# Patient Record
Sex: Male | Born: 1935 | Race: White | Hispanic: No | Marital: Married | Smoking: Former smoker
Health system: Southern US, Community
[De-identification: ages and names within clinical notes are randomized; demographics above are authoritative.]

## PROBLEM LIST (undated history)

## (undated) DIAGNOSIS — C4491 Basal cell carcinoma of skin, unspecified: Secondary | ICD-10-CM

## (undated) DIAGNOSIS — K4090 Unilateral inguinal hernia, without obstruction or gangrene, not specified as recurrent: Secondary | ICD-10-CM

## (undated) DIAGNOSIS — I471 Supraventricular tachycardia, unspecified: Secondary | ICD-10-CM

## (undated) DIAGNOSIS — H52209 Unspecified astigmatism, unspecified eye: Secondary | ICD-10-CM

## (undated) DIAGNOSIS — H353190 Nonexudative age-related macular degeneration, unspecified eye, stage unspecified: Secondary | ICD-10-CM

## (undated) HISTORY — PX: TONSILLECTOMY: SUR1361

## (undated) HISTORY — PX: HERNIA REPAIR: SHX51

## (undated) HISTORY — PX: CATARACT EXTRACTION, BILATERAL: SHX1313

## (undated) HISTORY — PX: APPENDECTOMY: SHX54

## (undated) HISTORY — PX: JOINT REPLACEMENT: SHX530

---

## 2000-08-10 ENCOUNTER — Encounter: Admission: RE | Admit: 2000-08-10 | Discharge: 2000-08-10 | Payer: Self-pay | Admitting: *Deleted

## 2000-08-10 ENCOUNTER — Encounter: Payer: Self-pay | Admitting: *Deleted

## 2007-08-11 ENCOUNTER — Inpatient Hospital Stay (HOSPITAL_COMMUNITY): Admission: RE | Admit: 2007-08-11 | Discharge: 2007-08-15 | Payer: Self-pay | Admitting: Orthopaedic Surgery

## 2011-02-10 NOTE — Discharge Summary (Signed)
NAMERIOT, WATERWORTH NO.:  [id number]   MEDICAL RECORD NO.:  [id number]          PATIENT TYPE:  INP   LOCATION:  5036                         FACILITY:  [facility]   PHYSICIAN:  Lubertha Basque. Dalldorf, M.D.DATE OF BIRTH:  1930/12/18   DATE OF ADMISSION:  08/11/2007  DATE OF DISCHARGE:  08/15/2007                               DISCHARGE SUMMARY   ADMITTING DIAGNOSES:  1. Right hip end-stage degenerative joint disease.  2. Hyperlipidemia.   DISCHARGE DIAGNOSES:  1. Right hip end-stage degenerative joint disease.  2. Hyperlipidemia.   OPERATION:  Right hip replacement.   BRIEF HISTORY:  Mr. Isola is a 77 year old white male patient well-known  to our practice, who had been seen about a year ago, but he has had  increasing right hip pain.  X-rays reveal end-stage DJD, bone on bone.  He is having pain with every step walking and also night.  We have  discussed treatment options with him, that being total hip replacement.   PERTINENT LABORATORY AND X-RAY FINDINGS:  Sodium 135, potassium 4.4,  glucose 89, BUN 18, creatinine 0.88.  Hemoglobin 15.6 with a slight drop  postop, WBC 9.5.   Chest x-ray:  No active disease.   COURSE IN THE HOSPITAL:  He was admitted postoperatively, IV Ancef 1 g  q.8 h. for 3 doses, PCA morphine pump and then the normal postoperative  protocol which includes DVT prophylaxis with Lovenox and Coumadin  regulated by Pharmacy, oral pain medications, incentive spirometry, knee-  high TEDs, physical therapy, to be out of bed touchdown weightbearing  only, on muscle relaxers and antiemetics p.r.n. and blood studies, CBC,  BMET and pro times, as stated above.  The first day postop, he was doing  very well and was little pain.  He was anticipating starting his  physical therapy.  Blood pressure was 94/58, temperature 97, hemoglobin  12.  He did have some urinary retention on an ultrasound and was noted  to have significant urinary contents in his  bladder; an I&O  catheterization was performed and then he later started to void better  and completely, breath sounds in all fields, abdomen soft and pain-free.  He was eating and voiding on his own, no sign of infection.  Dressing  was changed the second day postop; the wound was noted to be benign.  He  continued to progress in the hospital.  Home arrangements for home  therapy and INRs were taken care of through [facility] and he was  discharged home.   CONDITION ON DISCHARGE:  Improved.   DISCHARGE MEDICATIONS:  He will remain on:  1. Lipitor 10 mg one a day.  2. Silver Centrum.  3. Vitamin E.  4. Coumadin, dose regulated by Pharmacy.  5. Prescription for Percocet 5/325 mg one or two q.4-6 h. p.r.n. pain.   HOME CARE:  [facility] will provide his home care, as mentioned.   DIET:  He will be on a low-sodium heart-healthy diet.   WOUND CARE:  He may change his dressing daily.   ACTIVITY:  Touchdown weightbearing with  crutches or walker.   FOLLOWUP:  If any sign of infection, to call our office at [phone],  also return in 10 days to our office and will follow.      Lindwood Qua, P.A.      Lubertha Basque Jerl Santos, M.D.  Electronically Signed    MC/MEDQ  D:  08/14/2007  T:  08/15/2007  Job:  [id number]

## 2011-02-10 NOTE — Op Note (Signed)
NAMEJAX, Gary Rice NO.:  [id number]   MEDICAL RECORD NO.:  [id number]          PATIENT TYPE:  INP   LOCATION:  2899                         FACILITY:  [facility]   PHYSICIAN:  Lubertha Basque. Dalldorf, M.D.DATE OF BIRTH:  November 25, 1930   DATE OF PROCEDURE:  08/11/2007  DATE OF DISCHARGE:                               OPERATIVE REPORT   PREOPERATIVE DIAGNOSIS:  Right hip degenerative arthritis.   POSTOPERATIVE DIAGNOSIS:  Right hip degenerative arthritis.   PROCEDURE:  Right total hip replacement.   ANESTHESIA:  General.   ATTENDING SURGEON:  Lubertha Basque. Jerl Santos, M.D.   ASSISTANT:  Lindwood Qua, P.A.   INDICATIONS FOR PROCEDURE:  The patient is a 77 year old male with a  long history of right hip pain.  This has persisted despite various oral  anti-inflammatories and walking devices.  He has advanced degenerative  change on x-ray and pain which limits his ability to rest and walk.  He  is offered a hip replacement.  Informed operative consent was obtained  after discussion possible complications of reaction to anesthesia,  infection, DVT, PE, dislocation, and death.   SUMMARY OF FINDINGS AND PROCEDURE:  Under general anesthesia through a  posterior approach, a right total hip replacement was performed.  He had  advanced degenerative change and excellent bone quality.  Addressed this  problem with a uncemented DePuy ASR system using a size 8 high offset  [place] femoral stem with the 56 ASR cup.  The stem was capped with a 49  +5 ASR head.  Bryna Colander assisted throughout and was invaluable to  the completion of the case in that he helped position and retract while  I performed the procedure.  He also closed simultaneously to help  minimize OR time.   DESCRIPTION OF PROCEDURE:  The patient was taken to the operating suite  where general anesthetic was applied difficulty.  He was positioned in  the lateral decubitus position with the right hip up.  Hip positioners  were utilized, bony prominences were padded, and an axillary roll was  placed.  He was prepped and draped in normal sterile fashion.  After the  administration of preoperative IV Kefzol, a posterior approach was taken  of the right hip.  All appropriate anti-infective measures were used  including closed hooded exhaust systems for each member of the surgical  team, Betadine impregnated drape, and the preoperative IV antibiotic.  Dissection was carried down through a paucity of adipose tissue to  expose the IT band and gluteus maximus fascia which were incised  longitudinally to expose the short external rotators of the hip.  These  structures were tagged and reflected and a posterior capsulectomy was  performed.  The hip was dislocated.  A femoral neck cut was made just  above the lesser trochanter.  He had advanced degenerative change with  collapse of his femoral head.  He had excellent bone quality.  The  acetabulum was fully exposed and some labral tissues were excised.  We  took a small reamer to the inside wall of the pelvis and then  sequentially came up to a 55 followed by placement of size 56 ASR shell  and appropriate anteversion and tilt.  The femur was then exposed.  We  reamed laterally and then sequentially broached up to a size 8 which  seemed to fit best.  We did a trial reduction with a high offset  component and the +2 and 5 assemblies with a +5 giving him the best leg  length and equality.  He was stable in extension with external rotation  and flexion with internal rotation.  Trial components were removed  followed by placement of the size 8 high offset femoral stem with the +5  49 ASR head and neck assemblies.  The stem was placed in appropriate  anteversion.  The hip was again reduced and was stable.  The wound was  irrigated followed by reapproximation of the short external rotators to  the greater trochanteric region with nonabsorbable suture.  IT band and  gluteus  maximus fascia reapproximated with #1 Vicryl in interrupted  fashion.  Subcutaneous tissues reapproximated with 0 and 2-0 undyed  Vicryl, followed by skin closure with staples.  A Mepilex silicone  dressing was applied.  Estimated blood loss was 100 mL and  intraoperative fluids can be obtained from anesthesia records.   DISPOSITION:  The patient was extubated in the operating room and taken  to the recovery room in stable addition.  He was to be admitted to the  orthopedic surgery service for appropriate postop care to include  perioperative antibiotics and Coumadin plus Lovenox for DVT prophylaxis.      Lubertha Basque Jerl Santos, M.D.  Electronically Signed     PGD/MEDQ  D:  08/11/2007  T:  08/12/2007  Job:  [id number]

## 2011-07-07 LAB — BASIC METABOLIC PANEL
BUN: 12
BUN: 14
CO2: 29
CO2: 30
Calcium: 8.8
Calcium: 8.8
Chloride: 101
Chloride: 103
Creatinine, Ser: 0.77
Creatinine, Ser: 0.87
GFR calc Af Amer: >60
GFR calc Af Amer: >60
GFR calc non Af Amer: >60
GFR calc non Af Amer: >60
Glucose, Bld: 111 — ABNORMAL HIGH
Glucose, Bld: 119 — ABNORMAL HIGH
Potassium: 3.5
Potassium: 4.2
Sodium: 136
Sodium: 139

## 2011-07-07 LAB — PROTIME-INR
INR: 1.3
INR: 1.7 — ABNORMAL HIGH
INR: 2.6 — ABNORMAL HIGH
Prothrombin Time: 16.7 — ABNORMAL HIGH
Prothrombin Time: 20.7 — ABNORMAL HIGH
Prothrombin Time: 28.7 — ABNORMAL HIGH
Prothrombin Time: 13.5

## 2011-07-07 LAB — BASIC METABOLIC PANEL WITH GFR
BUN: 16
BUN: 18
CO2: 26
CO2: 27
Calcium: 8.8
Calcium: 9.8
Chloride: 100
Chloride: 103
Creatinine, Ser: 0.87
Creatinine, Ser: 0.88
GFR calc non Af Amer: >60
GFR calc non Af Amer: >60
Glucose, Bld: 163 — ABNORMAL HIGH
Glucose, Bld: 89
Potassium: 4.3
Potassium: 4.4
Sodium: 135
Sodium: 136

## 2011-07-07 LAB — CBC
HCT: 34.6 — ABNORMAL LOW
HCT: 34.8 — ABNORMAL LOW
HCT: 45.7
Hemoglobin: 12 — ABNORMAL LOW
MCHC: 34.6
MCV: 92.5
Platelets: 193
Platelets: 199
Platelets: 229
Platelets: 281
RBC: 3.76 — ABNORMAL LOW
RBC: 4.01 — ABNORMAL LOW
RDW: 12.8
RDW: 12.8
RDW: 13
WBC: 12.1 — ABNORMAL HIGH
WBC: 12.6 — ABNORMAL HIGH
WBC: 11 — ABNORMAL HIGH

## 2011-10-28 DIAGNOSIS — M5137 Other intervertebral disc degeneration, lumbosacral region: Secondary | ICD-10-CM | POA: Diagnosis not present

## 2011-11-05 DIAGNOSIS — H5210 Myopia, unspecified eye: Secondary | ICD-10-CM | POA: Diagnosis not present

## 2011-11-05 DIAGNOSIS — H35379 Puckering of macula, unspecified eye: Secondary | ICD-10-CM | POA: Diagnosis not present

## 2011-11-05 DIAGNOSIS — H2510 Age-related nuclear cataract, unspecified eye: Secondary | ICD-10-CM | POA: Diagnosis not present

## 2011-11-05 DIAGNOSIS — H353190 Nonexudative age-related macular degeneration, unspecified eye, stage unspecified: Secondary | ICD-10-CM | POA: Diagnosis not present

## 2011-11-18 DIAGNOSIS — M25519 Pain in unspecified shoulder: Secondary | ICD-10-CM | POA: Diagnosis not present

## 2011-11-18 DIAGNOSIS — M545 Low back pain, unspecified: Secondary | ICD-10-CM | POA: Diagnosis not present

## 2012-05-05 DIAGNOSIS — H35379 Puckering of macula, unspecified eye: Secondary | ICD-10-CM | POA: Diagnosis not present

## 2012-05-05 DIAGNOSIS — H353190 Nonexudative age-related macular degeneration, unspecified eye, stage unspecified: Secondary | ICD-10-CM | POA: Diagnosis not present

## 2012-05-05 DIAGNOSIS — H2510 Age-related nuclear cataract, unspecified eye: Secondary | ICD-10-CM | POA: Diagnosis not present

## 2012-06-21 DIAGNOSIS — H2510 Age-related nuclear cataract, unspecified eye: Secondary | ICD-10-CM | POA: Diagnosis not present

## 2012-07-29 DIAGNOSIS — Z23 Encounter for immunization: Secondary | ICD-10-CM | POA: Diagnosis not present

## 2012-08-18 DIAGNOSIS — Z79899 Other long term (current) drug therapy: Secondary | ICD-10-CM | POA: Diagnosis not present

## 2012-08-18 DIAGNOSIS — E7800 Pure hypercholesterolemia, unspecified: Secondary | ICD-10-CM | POA: Diagnosis not present

## 2012-08-18 DIAGNOSIS — Z1331 Encounter for screening for depression: Secondary | ICD-10-CM | POA: Diagnosis not present

## 2012-08-18 DIAGNOSIS — Z0000 Encounter for general adult medical examination without abnormal findings: Secondary | ICD-10-CM | POA: Diagnosis not present

## 2012-08-22 DIAGNOSIS — H269 Unspecified cataract: Secondary | ICD-10-CM | POA: Diagnosis not present

## 2012-08-22 DIAGNOSIS — H2510 Age-related nuclear cataract, unspecified eye: Secondary | ICD-10-CM | POA: Diagnosis not present

## 2012-08-23 DIAGNOSIS — H2510 Age-related nuclear cataract, unspecified eye: Secondary | ICD-10-CM | POA: Diagnosis not present

## 2012-09-05 DIAGNOSIS — H2510 Age-related nuclear cataract, unspecified eye: Secondary | ICD-10-CM | POA: Diagnosis not present

## 2012-09-05 DIAGNOSIS — H269 Unspecified cataract: Secondary | ICD-10-CM | POA: Diagnosis not present

## 2013-08-21 DIAGNOSIS — E7800 Pure hypercholesterolemia, unspecified: Secondary | ICD-10-CM | POA: Diagnosis not present

## 2013-08-21 DIAGNOSIS — Z1331 Encounter for screening for depression: Secondary | ICD-10-CM | POA: Diagnosis not present

## 2013-08-21 DIAGNOSIS — Z0000 Encounter for general adult medical examination without abnormal findings: Secondary | ICD-10-CM | POA: Diagnosis not present

## 2013-08-21 DIAGNOSIS — Z23 Encounter for immunization: Secondary | ICD-10-CM | POA: Diagnosis not present

## 2013-08-21 DIAGNOSIS — Z79899 Other long term (current) drug therapy: Secondary | ICD-10-CM | POA: Diagnosis not present

## 2013-10-03 DIAGNOSIS — H353190 Nonexudative age-related macular degeneration, unspecified eye, stage unspecified: Secondary | ICD-10-CM | POA: Diagnosis not present

## 2014-05-24 DIAGNOSIS — R94112 Abnormal visually evoked potential [VEP]: Secondary | ICD-10-CM | POA: Diagnosis not present

## 2014-05-24 DIAGNOSIS — H353190 Nonexudative age-related macular degeneration, unspecified eye, stage unspecified: Secondary | ICD-10-CM | POA: Diagnosis not present

## 2014-07-31 DIAGNOSIS — Z23 Encounter for immunization: Secondary | ICD-10-CM | POA: Diagnosis not present

## 2014-08-22 DIAGNOSIS — L989 Disorder of the skin and subcutaneous tissue, unspecified: Secondary | ICD-10-CM | POA: Diagnosis not present

## 2014-08-22 DIAGNOSIS — E559 Vitamin D deficiency, unspecified: Secondary | ICD-10-CM | POA: Diagnosis not present

## 2014-08-22 DIAGNOSIS — Z125 Encounter for screening for malignant neoplasm of prostate: Secondary | ICD-10-CM | POA: Diagnosis not present

## 2014-08-22 DIAGNOSIS — Z1389 Encounter for screening for other disorder: Secondary | ICD-10-CM | POA: Diagnosis not present

## 2014-08-22 DIAGNOSIS — Z86010 Personal history of colonic polyps: Secondary | ICD-10-CM | POA: Diagnosis not present

## 2014-08-22 DIAGNOSIS — Z23 Encounter for immunization: Secondary | ICD-10-CM | POA: Diagnosis not present

## 2014-08-22 DIAGNOSIS — J310 Chronic rhinitis: Secondary | ICD-10-CM | POA: Diagnosis not present

## 2014-08-22 DIAGNOSIS — E780 Pure hypercholesterolemia: Secondary | ICD-10-CM | POA: Diagnosis not present

## 2014-08-22 DIAGNOSIS — R1030 Lower abdominal pain, unspecified: Secondary | ICD-10-CM | POA: Diagnosis not present

## 2014-08-22 DIAGNOSIS — Z0001 Encounter for general adult medical examination with abnormal findings: Secondary | ICD-10-CM | POA: Diagnosis not present

## 2014-08-22 DIAGNOSIS — Z79899 Other long term (current) drug therapy: Secondary | ICD-10-CM | POA: Diagnosis not present

## 2014-09-05 DIAGNOSIS — C44612 Basal cell carcinoma of skin of right upper limb, including shoulder: Secondary | ICD-10-CM | POA: Diagnosis not present

## 2014-09-05 DIAGNOSIS — C44519 Basal cell carcinoma of skin of other part of trunk: Secondary | ICD-10-CM | POA: Diagnosis not present

## 2014-09-12 DIAGNOSIS — Z4802 Encounter for removal of sutures: Secondary | ICD-10-CM | POA: Diagnosis not present

## 2014-09-12 DIAGNOSIS — L989 Disorder of the skin and subcutaneous tissue, unspecified: Secondary | ICD-10-CM | POA: Diagnosis not present

## 2014-09-17 DIAGNOSIS — M25559 Pain in unspecified hip: Secondary | ICD-10-CM | POA: Diagnosis not present

## 2014-12-04 DIAGNOSIS — H524 Presbyopia: Secondary | ICD-10-CM | POA: Diagnosis not present

## 2014-12-04 DIAGNOSIS — Z961 Presence of intraocular lens: Secondary | ICD-10-CM | POA: Diagnosis not present

## 2014-12-04 DIAGNOSIS — H52222 Regular astigmatism, left eye: Secondary | ICD-10-CM | POA: Diagnosis not present

## 2014-12-04 DIAGNOSIS — H3531 Nonexudative age-related macular degeneration: Secondary | ICD-10-CM | POA: Diagnosis not present

## 2015-06-20 DIAGNOSIS — Z961 Presence of intraocular lens: Secondary | ICD-10-CM | POA: Diagnosis not present

## 2015-06-20 DIAGNOSIS — H35371 Puckering of macula, right eye: Secondary | ICD-10-CM | POA: Diagnosis not present

## 2015-06-20 DIAGNOSIS — H3531 Nonexudative age-related macular degeneration: Secondary | ICD-10-CM | POA: Diagnosis not present

## 2015-08-01 DIAGNOSIS — E559 Vitamin D deficiency, unspecified: Secondary | ICD-10-CM | POA: Diagnosis not present

## 2015-08-01 DIAGNOSIS — Z86010 Personal history of colonic polyps: Secondary | ICD-10-CM | POA: Diagnosis not present

## 2015-08-01 DIAGNOSIS — E7800 Pure hypercholesterolemia, unspecified: Secondary | ICD-10-CM | POA: Diagnosis not present

## 2015-08-01 DIAGNOSIS — Z0001 Encounter for general adult medical examination with abnormal findings: Secondary | ICD-10-CM | POA: Diagnosis not present

## 2015-08-01 DIAGNOSIS — L259 Unspecified contact dermatitis, unspecified cause: Secondary | ICD-10-CM | POA: Diagnosis not present

## 2015-08-01 DIAGNOSIS — J310 Chronic rhinitis: Secondary | ICD-10-CM | POA: Diagnosis not present

## 2015-08-01 DIAGNOSIS — Z23 Encounter for immunization: Secondary | ICD-10-CM | POA: Diagnosis not present

## 2015-08-01 DIAGNOSIS — Z1389 Encounter for screening for other disorder: Secondary | ICD-10-CM | POA: Diagnosis not present

## 2015-08-01 DIAGNOSIS — Z79899 Other long term (current) drug therapy: Secondary | ICD-10-CM | POA: Diagnosis not present

## 2015-08-28 DIAGNOSIS — Z1211 Encounter for screening for malignant neoplasm of colon: Secondary | ICD-10-CM | POA: Diagnosis not present

## 2015-08-28 DIAGNOSIS — Z1212 Encounter for screening for malignant neoplasm of rectum: Secondary | ICD-10-CM | POA: Diagnosis not present

## 2015-09-18 ENCOUNTER — Other Ambulatory Visit: Payer: Self-pay | Admitting: Gastroenterology

## 2015-09-18 DIAGNOSIS — M25559 Pain in unspecified hip: Secondary | ICD-10-CM | POA: Diagnosis not present

## 2015-10-25 ENCOUNTER — Encounter (HOSPITAL_COMMUNITY): Payer: Self-pay | Admitting: *Deleted

## 2015-12-10 DIAGNOSIS — H52222 Regular astigmatism, left eye: Secondary | ICD-10-CM | POA: Diagnosis not present

## 2015-12-10 DIAGNOSIS — H353132 Nonexudative age-related macular degeneration, bilateral, intermediate dry stage: Secondary | ICD-10-CM | POA: Diagnosis not present

## 2015-12-10 DIAGNOSIS — H35371 Puckering of macula, right eye: Secondary | ICD-10-CM | POA: Diagnosis not present

## 2015-12-10 DIAGNOSIS — H524 Presbyopia: Secondary | ICD-10-CM | POA: Diagnosis not present

## 2015-12-17 ENCOUNTER — Ambulatory Visit (HOSPITAL_COMMUNITY): Admission: RE | Admit: 2015-12-17 | Payer: Medicare Other | Source: Ambulatory Visit | Admitting: Gastroenterology

## 2015-12-17 SURGERY — COLONOSCOPY WITH PROPOFOL
Anesthesia: Monitor Anesthesia Care

## 2016-04-02 DIAGNOSIS — L089 Local infection of the skin and subcutaneous tissue, unspecified: Secondary | ICD-10-CM | POA: Diagnosis not present

## 2016-04-02 DIAGNOSIS — L0390 Cellulitis, unspecified: Secondary | ICD-10-CM | POA: Diagnosis not present

## 2016-04-02 DIAGNOSIS — A499 Bacterial infection, unspecified: Secondary | ICD-10-CM | POA: Diagnosis not present

## 2016-04-12 DIAGNOSIS — A499 Bacterial infection, unspecified: Secondary | ICD-10-CM | POA: Diagnosis not present

## 2016-04-12 DIAGNOSIS — L089 Local infection of the skin and subcutaneous tissue, unspecified: Secondary | ICD-10-CM | POA: Diagnosis not present

## 2016-04-12 DIAGNOSIS — L0390 Cellulitis, unspecified: Secondary | ICD-10-CM | POA: Diagnosis not present

## 2016-04-18 DIAGNOSIS — K5641 Fecal impaction: Secondary | ICD-10-CM | POA: Diagnosis not present

## 2016-04-18 DIAGNOSIS — K649 Unspecified hemorrhoids: Secondary | ICD-10-CM | POA: Diagnosis not present

## 2016-04-18 DIAGNOSIS — K5900 Constipation, unspecified: Secondary | ICD-10-CM | POA: Diagnosis not present

## 2016-06-09 DIAGNOSIS — H35371 Puckering of macula, right eye: Secondary | ICD-10-CM | POA: Diagnosis not present

## 2016-06-09 DIAGNOSIS — Z961 Presence of intraocular lens: Secondary | ICD-10-CM | POA: Diagnosis not present

## 2016-06-09 DIAGNOSIS — H353132 Nonexudative age-related macular degeneration, bilateral, intermediate dry stage: Secondary | ICD-10-CM | POA: Diagnosis not present

## 2016-06-09 DIAGNOSIS — R94112 Abnormal visually evoked potential [VEP]: Secondary | ICD-10-CM | POA: Diagnosis not present

## 2016-06-18 DIAGNOSIS — Z23 Encounter for immunization: Secondary | ICD-10-CM | POA: Diagnosis not present

## 2016-09-04 DIAGNOSIS — E559 Vitamin D deficiency, unspecified: Secondary | ICD-10-CM | POA: Diagnosis not present

## 2016-09-04 DIAGNOSIS — J310 Chronic rhinitis: Secondary | ICD-10-CM | POA: Diagnosis not present

## 2016-09-04 DIAGNOSIS — E7800 Pure hypercholesterolemia, unspecified: Secondary | ICD-10-CM | POA: Diagnosis not present

## 2016-09-04 DIAGNOSIS — Z1389 Encounter for screening for other disorder: Secondary | ICD-10-CM | POA: Diagnosis not present

## 2016-09-04 DIAGNOSIS — Z79899 Other long term (current) drug therapy: Secondary | ICD-10-CM | POA: Diagnosis not present

## 2016-09-04 DIAGNOSIS — Z0000 Encounter for general adult medical examination without abnormal findings: Secondary | ICD-10-CM | POA: Diagnosis not present

## 2016-09-04 DIAGNOSIS — Z86010 Personal history of colonic polyps: Secondary | ICD-10-CM | POA: Diagnosis not present

## 2016-09-17 DIAGNOSIS — M25559 Pain in unspecified hip: Secondary | ICD-10-CM | POA: Diagnosis not present

## 2016-12-14 DIAGNOSIS — H35371 Puckering of macula, right eye: Secondary | ICD-10-CM | POA: Diagnosis not present

## 2016-12-14 DIAGNOSIS — H1851 Endothelial corneal dystrophy: Secondary | ICD-10-CM | POA: Diagnosis not present

## 2016-12-14 DIAGNOSIS — H353132 Nonexudative age-related macular degeneration, bilateral, intermediate dry stage: Secondary | ICD-10-CM | POA: Diagnosis not present

## 2017-03-19 DIAGNOSIS — M25559 Pain in unspecified hip: Secondary | ICD-10-CM | POA: Diagnosis not present

## 2017-06-21 DIAGNOSIS — R94112 Abnormal visually evoked potential [VEP]: Secondary | ICD-10-CM | POA: Diagnosis not present

## 2017-06-21 DIAGNOSIS — H353132 Nonexudative age-related macular degeneration, bilateral, intermediate dry stage: Secondary | ICD-10-CM | POA: Diagnosis not present

## 2017-06-21 DIAGNOSIS — Z961 Presence of intraocular lens: Secondary | ICD-10-CM | POA: Diagnosis not present

## 2017-06-21 DIAGNOSIS — H1851 Endothelial corneal dystrophy: Secondary | ICD-10-CM | POA: Diagnosis not present

## 2017-07-01 DIAGNOSIS — I499 Cardiac arrhythmia, unspecified: Secondary | ICD-10-CM | POA: Diagnosis not present

## 2017-07-01 DIAGNOSIS — I471 Supraventricular tachycardia: Secondary | ICD-10-CM | POA: Diagnosis not present

## 2017-07-01 DIAGNOSIS — R079 Chest pain, unspecified: Secondary | ICD-10-CM | POA: Diagnosis not present

## 2017-07-01 DIAGNOSIS — S51812A Laceration without foreign body of left forearm, initial encounter: Secondary | ICD-10-CM | POA: Diagnosis not present

## 2017-07-01 DIAGNOSIS — Z6824 Body mass index (BMI) 24.0-24.9, adult: Secondary | ICD-10-CM | POA: Diagnosis not present

## 2017-07-01 DIAGNOSIS — R000 Tachycardia, unspecified: Secondary | ICD-10-CM | POA: Diagnosis not present

## 2017-07-01 DIAGNOSIS — W268XXA Contact with other sharp object(s), not elsewhere classified, initial encounter: Secondary | ICD-10-CM | POA: Diagnosis not present

## 2017-07-16 DIAGNOSIS — Z23 Encounter for immunization: Secondary | ICD-10-CM | POA: Diagnosis not present

## 2017-08-09 NOTE — Progress Notes (Signed)
.   Cardiology Office Note NEW PATIENT VISIT  Date:  08/10/2017   ID:  Gary Rice, DOB [birth year], MRN [id number]  PCP:  Gary Huddle, MD  Cardiologist:  NEW  Dr. Marlou Rice  Chief Complaint  Patient presents with  . Palpitations    had tachycardia in [place]      History of Present Illness: Gary Rice is a 83 y.o. male who is being seen today for the evaluation of atrial tach  at the request of the patient.  No ref. provider found.   No flowsheet data found.   The first of Oct pt was in [place] for his [employer] and had skin tear when he went to urgent care his HR was 140 and listed as SVT.  He was then transported by EMS to hospital but converted to SR en route.  EKG was SR rate of 71 with early repol.  Non specific ST and T wave abnormality.   CXR fno focal opacity in lungs, no pl effusion no acute process in chest. Na 137, K+ 4.1, cl. 107, C)2 23, Cr 0.8 mg+ 2.1, Hgb 14.9, hct 43.4.    He may have felt mild fluttering but no chest pain and no SOB.  No dizziness.  He has had not further episodes.  He tells me today he may have had 2 previous episode in the past but no symptoms but fluttering and did not last long.  He has not had any chest pain, he mows grass with electric mower and does his own leaves and has no chest pain or SOB.    History reviewed. No pertinent past medical history.  Denies HTN, chest pain, SOB, DM-2, thyroid disease.   Past Surgical History:  Procedure Laterality Date  . APPENDECTOMY     age 64  . CATARACT EXTRACTION, BILATERAL    . HERNIA REPAIR     RIH  . JOINT REPLACEMENT Right    RTHA  . TONSILLECTOMY       Current Outpatient Medications  Medication Sig Dispense Refill  . aspirin EC 81 MG tablet Take 81 mg daily by mouth.    Marland Kitchen atorvastatin (LIPITOR) 10 MG tablet Take 10 mg by mouth daily.    . fluticasone (FLONASE) 50 MCG/ACT nasal spray Place 1 spray into both nostrils daily as needed for allergies.     .  Multiple Vitamin (MULTIVITAMIN WITH MINERALS) TABS tablet Take 1 tablet by mouth daily.    . Multiple Vitamins-Minerals (PRESERVISION AREDS 2) CAPS Take 1 capsule 2 (two) times daily by mouth.    Marland Kitchen VITAMIN E PO Take 1 capsule by mouth daily.      No current facility-administered medications for this visit.     Allergies:   Patient has no known allergies.    Social History:  The patient  reports that he quit smoking about 66 years ago. His smoking use included cigarettes. he has never used smokeless tobacco. He reports that he does not drink alcohol or use drugs.   Family History:  The patient's family history includes Heart attack in his father and son; Heart disease in his father; Sleep apnea in his son.    ROS:  General:no colds or fevers, no weight changes Skin:no rashes or ulcers HEENT:no blurred vision, no congestion CV:see HPI PUL:see HPI GI:no diarrhea constipation or melena, no indigestion GU:no hematuria, no dysuria MS:no joint pain, no claudication Neuro:no syncope, no lightheadedness Endo:no diabetes, no thyroid disease  Wt Readings from Last  3 Encounters:  08/10/17 179 lb 12.8 oz (81.6 kg)     PHYSICAL EXAM: VS:  BP 108/60   Pulse 78   Resp 17   Ht 5\' 10"  (1.778 m)   Wt 179 lb 12.8 oz (81.6 kg)   SpO2 97%   BMI 25.80 kg/m  , BMI Body mass index is 25.8 kg/m. General:Pleasant affect, NAD Skin:Warm and dry, brisk capillary refill HEENT:normocephalic, sclera clear, mucus membranes moist Neck:supple, no JVD, no bruits  Heart:S1S2 RRR without murmur, gallup, rub or click Lungs:clear without rales, rhonchi, or wheezes WLS:LHTD, non tender, + BS, do not palpate liver spleen or masses Ext:no lower ext edema, 2+ pedal pulses, 2+ radial pulses Neuro:alert and oriented X 3, MAE, follows commands, + facial symmetry    EKG:  EKG is ordered today. The ekg ordered today demonstrates SR with sinus arrthymia, peaked T wave in V2-V5.   Recent Labs: No results found  for requested labs within last 8760 hours.    Lipid Panel No results found for: CHOL, TRIG, HDL, CHOLHDL, VLDL, LDLCALC, LDLDIRECT     Other studies Reviewed: Additional studies/ records that were reviewed today include: notes from hospital in [place].   ASSESSMENT AND PLAN:  1.  SVT 3 episodes in his life.  Discussed with Dr. Marlou Rice and will do Echo to eval LV function and valves with abnormal EKG.  If he has further episodes with give dilt 30 mg to take once prn for rapid HR.  Or he could call office to have urgent ekg.  He has no chest pain so instructed if he develops any we will do stress test- myoview.   He will follow up with Dr. Marlou Rice at next available appt.    Current medicines are reviewed with the patient today.  The patient Has no concerns regarding medicines.  The following changes have been made:  See above Labs/ tests ordered today include:see above  Disposition:   FU:  see above  Signed, Gary Kicks, NP  08/10/2017 9:13 AM    [address], [place], [place], [place] Phone: [phone]; Fax: [phone]

## 2017-08-10 ENCOUNTER — Encounter: Payer: Self-pay | Admitting: Cardiology

## 2017-08-10 ENCOUNTER — Ambulatory Visit (INDEPENDENT_AMBULATORY_CARE_PROVIDER_SITE_OTHER): Payer: Medicare Other | Admitting: Cardiology

## 2017-08-10 ENCOUNTER — Encounter (INDEPENDENT_AMBULATORY_CARE_PROVIDER_SITE_OTHER): Payer: Self-pay

## 2017-08-10 VITALS — BP 108/60 | HR 78 | Resp 17 | Ht 70.0 in | Wt 179.8 lb

## 2017-08-10 DIAGNOSIS — I471 Supraventricular tachycardia, unspecified: Secondary | ICD-10-CM

## 2017-08-10 DIAGNOSIS — R9431 Abnormal electrocardiogram [ECG] [EKG]: Secondary | ICD-10-CM | POA: Diagnosis not present

## 2017-08-10 DIAGNOSIS — R000 Tachycardia, unspecified: Secondary | ICD-10-CM

## 2017-08-10 MED ORDER — DILTIAZEM HCL 30 MG PO TABS: ORAL_TABLET | ORAL | 0 refills | Status: DC

## 2017-08-10 NOTE — Patient Instructions (Signed)
Medication Instructions:  START Cardizem 30 mg take 1 tablet as needed for rapid heart rate  Labwork: None   Testing/Procedures: Your physician has requested that you have an echocardiogram. Echocardiography is a painless test that uses sound waves to create images of your heart. It provides your doctor with information about the size and shape of your heart and how well your heart's chambers and valves are working. This procedure takes approximately one hour. There are no restrictions for this procedure.   Follow-Up: Your physician recommends that you schedule a follow-up appointment in: FIRST AVAILABLE with DR Marlou Porch  Any Other Special Instructions Will Be Listed Below (If Applicable).  If you need a refill on your cardiac medications before your next appointment, please call your pharmacy.

## 2017-08-16 ENCOUNTER — Other Ambulatory Visit: Payer: Self-pay

## 2017-08-16 ENCOUNTER — Ambulatory Visit (HOSPITAL_COMMUNITY): Payer: Medicare Other | Attending: Cardiology

## 2017-08-16 DIAGNOSIS — I080 Rheumatic disorders of both mitral and aortic valves: Secondary | ICD-10-CM | POA: Insufficient documentation

## 2017-08-16 DIAGNOSIS — R9431 Abnormal electrocardiogram [ECG] [EKG]: Secondary | ICD-10-CM | POA: Insufficient documentation

## 2017-08-16 DIAGNOSIS — R000 Tachycardia, unspecified: Secondary | ICD-10-CM | POA: Diagnosis not present

## 2017-08-17 ENCOUNTER — Telehealth: Payer: Self-pay | Admitting: Cardiology

## 2017-08-17 NOTE — Telephone Encounter (Signed)
Returned pts call and he has been made aware of his echo results. See result note. 

## 2017-08-17 NOTE — Telephone Encounter (Signed)
Follow up ° °Patient is returning call for Echo results. °

## 2017-09-17 DIAGNOSIS — Z1389 Encounter for screening for other disorder: Secondary | ICD-10-CM | POA: Diagnosis not present

## 2017-09-17 DIAGNOSIS — Z0000 Encounter for general adult medical examination without abnormal findings: Secondary | ICD-10-CM | POA: Diagnosis not present

## 2017-09-17 DIAGNOSIS — E7800 Pure hypercholesterolemia, unspecified: Secondary | ICD-10-CM | POA: Diagnosis not present

## 2017-09-17 DIAGNOSIS — Z79899 Other long term (current) drug therapy: Secondary | ICD-10-CM | POA: Diagnosis not present

## 2017-09-17 DIAGNOSIS — E559 Vitamin D deficiency, unspecified: Secondary | ICD-10-CM | POA: Diagnosis not present

## 2017-09-17 DIAGNOSIS — R000 Tachycardia, unspecified: Secondary | ICD-10-CM | POA: Diagnosis not present

## 2017-09-17 DIAGNOSIS — J310 Chronic rhinitis: Secondary | ICD-10-CM | POA: Diagnosis not present

## 2017-09-17 DIAGNOSIS — Z86010 Personal history of colonic polyps: Secondary | ICD-10-CM | POA: Diagnosis not present

## 2017-09-17 DIAGNOSIS — M25559 Pain in unspecified hip: Secondary | ICD-10-CM | POA: Diagnosis not present

## 2017-09-17 DIAGNOSIS — M1990 Unspecified osteoarthritis, unspecified site: Secondary | ICD-10-CM | POA: Diagnosis not present

## 2017-10-05 ENCOUNTER — Other Ambulatory Visit: Payer: Self-pay | Admitting: Cardiology

## 2017-10-05 DIAGNOSIS — R000 Tachycardia, unspecified: Secondary | ICD-10-CM

## 2017-10-05 DIAGNOSIS — R9431 Abnormal electrocardiogram [ECG] [EKG]: Secondary | ICD-10-CM

## 2017-10-08 ENCOUNTER — Ambulatory Visit (INDEPENDENT_AMBULATORY_CARE_PROVIDER_SITE_OTHER): Payer: Medicare Other | Admitting: Cardiology

## 2017-10-08 ENCOUNTER — Encounter: Payer: Self-pay | Admitting: Cardiology

## 2017-10-08 VITALS — BP 120/80 | HR 84 | Ht 70.0 in | Wt 179.0 lb

## 2017-10-08 DIAGNOSIS — I471 Supraventricular tachycardia: Secondary | ICD-10-CM | POA: Diagnosis not present

## 2017-10-08 NOTE — Patient Instructions (Signed)

## 2017-10-08 NOTE — Progress Notes (Signed)
Cardiology Office Note:    Date:  10/08/2017   ID:  Gary Rice, DOB 09/13/1931, MRN [id number]  PCP:  Gary Huddle, MD  Cardiologist:  No primary care provider on file.   Referring MD: Gary Huddle, MD     History of Present Illness:    Gary Rice is a 84 y.o. male with a hx of atrial tachycardia, heart rate 140, SVT, EMS in [place] here for follow-up.  He felt mild fluttering at the time, no anginal symptoms.  May have had 2 prior episodes.  This occurred in October 2018. Skinned arm, went to urgent care. Memorial service for son. HR was 145.   Gary Rice grasped with an Marketing executive, leaves, no anginal symptoms.  No syncope bleeding orthopnea PND.  No past medical history on file.  Past Surgical History:  Procedure Laterality Date  . APPENDECTOMY     age 61  . CATARACT EXTRACTION, BILATERAL    . HERNIA REPAIR     RIH  . JOINT REPLACEMENT Right    RTHA  . TONSILLECTOMY      Current Medications: Current Meds  Medication Sig  . aspirin EC 81 MG tablet Take 81 mg daily by mouth.  Marland Kitchen atorvastatin (LIPITOR) 10 MG tablet Take 10 mg by mouth daily.  Marland Kitchen diltiazem (CARDIZEM) 30 MG tablet TAKE 1 TABLET AS NEEDED FOR RAPID HEART BEAT  . fluticasone (FLONASE) 50 MCG/ACT nasal spray Place 1 spray into both nostrils daily as needed for allergies.   . Multiple Vitamin (MULTIVITAMIN WITH MINERALS) TABS tablet Take 1 tablet by mouth daily.  . Multiple Vitamins-Minerals (PRESERVISION AREDS 2) CAPS Take 1 capsule 2 (two) times daily by mouth.  Marland Kitchen VITAMIN E PO Take 1 capsule by mouth daily.      Allergies:   Patient has no known allergies.   Social History   Socioeconomic History  . Marital status: Married    Spouse name: None  . Number of children: None  . Years of education: None  . Highest education level: None  Social Needs  . Financial resource strain: None  . Food insecurity - worry: None  . Food insecurity - inability: None  . Transportation needs -  medical: None  . Transportation needs - non-medical: None  Occupational History  . None  Tobacco Use  . Smoking status: Former Smoker    Types: Cigarettes    Last attempt to quit: 10/24/1950    Years since quitting: 67.0  . Smokeless tobacco: Never Used  Substance and Sexual Activity  . Alcohol use: No  . Drug use: No  . Sexual activity: None  Other Topics Concern  . None  Social History Narrative  . None     Family History: The patient's family history includes Heart attack in his father and son; Heart disease in his father; Sleep apnea in his son.  ROS:   Please see the history of present illness.     All other systems reviewed and are negative.  EKGs/Labs/Other Studies Reviewed:    The following studies were reviewed today:   EKG:  EKG is ordered today.  The ekg ordered today demonstrates   Recent Labs: No results found for requested labs within last 8760 hours.  Recent Lipid Panel No results found for: CHOL, TRIG, HDL, CHOLHDL, VLDL, LDLCALC, LDLDIRECT  Physical Exam:    VS:  BP 120/80   Pulse 84   Ht 5\' 10"  (1.778 m)   Wt 179 lb (81.2 kg)  SpO2 96%   BMI 25.68 kg/m     Wt Readings from Last 3 Encounters:  10/08/17 179 lb (81.2 kg)  08/10/17 179 lb 12.8 oz (81.6 kg)     GEN:  Well nourished, well developed in no acute distress HEENT: Normal NECK: No JVD; No carotid bruits LYMPHATICS: No lymphadenopathy CARDIAC: RRR, no murmurs, rubs, gallops RESPIRATORY:  Clear to auscultation without rales, wheezing or rhonchi  ABDOMEN: Soft, non-tender, non-distended MUSCULOSKELETAL:  No edema; No deformity  SKIN: Warm and dry NEUROLOGIC:  Alert and oriented x 3 PSYCHIATRIC:  Normal affect   ASSESSMENT:    1. SVT (supraventricular tachycardia) (HCC)    PLAN:    In order of problems listed above:  SVT -possibly 3 episodes.  Feels sometimes a flutter-like sensation in his chest.  He knows to take diltiazem as needed as above.  If these were to become a  recurrent theme with issues, could consider EP.  I take care of his wife, Gary Rice. fib.  She is on Xarelto.   Medication Adjustments/Labs and Tests Ordered: Current medicines are reviewed at length with the patient today.  Concerns regarding medicines are outlined above.  No orders of the defined types were placed in this encounter.  No orders of the defined types were placed in this encounter.   Signed, Candee Furbish, MD  10/08/2017 9:45 AM    [name]

## 2017-12-13 DIAGNOSIS — H1851 Endothelial corneal dystrophy: Secondary | ICD-10-CM | POA: Diagnosis not present

## 2017-12-13 DIAGNOSIS — H35371 Puckering of macula, right eye: Secondary | ICD-10-CM | POA: Diagnosis not present

## 2017-12-13 DIAGNOSIS — H353132 Nonexudative age-related macular degeneration, bilateral, intermediate dry stage: Secondary | ICD-10-CM | POA: Diagnosis not present

## 2018-01-17 ENCOUNTER — Telehealth: Payer: Self-pay | Admitting: Cardiology

## 2018-01-17 NOTE — Telephone Encounter (Signed)
Records received from [facility]. Placed in Chart prep.

## 2018-01-18 ENCOUNTER — Telehealth: Payer: Self-pay

## 2018-01-18 NOTE — Telephone Encounter (Signed)
Received notes from [facility]. Attached to April file.

## 2018-03-25 DIAGNOSIS — M25559 Pain in unspecified hip: Secondary | ICD-10-CM | POA: Diagnosis not present

## 2018-06-15 DIAGNOSIS — H1851 Endothelial corneal dystrophy: Secondary | ICD-10-CM | POA: Diagnosis not present

## 2018-06-15 DIAGNOSIS — H35371 Puckering of macula, right eye: Secondary | ICD-10-CM | POA: Diagnosis not present

## 2018-06-15 DIAGNOSIS — H353132 Nonexudative age-related macular degeneration, bilateral, intermediate dry stage: Secondary | ICD-10-CM | POA: Diagnosis not present

## 2018-06-24 DIAGNOSIS — Z23 Encounter for immunization: Secondary | ICD-10-CM | POA: Diagnosis not present

## 2018-10-06 DIAGNOSIS — E7800 Pure hypercholesterolemia, unspecified: Secondary | ICD-10-CM | POA: Diagnosis not present

## 2018-10-06 DIAGNOSIS — Z79899 Other long term (current) drug therapy: Secondary | ICD-10-CM | POA: Diagnosis not present

## 2018-10-06 DIAGNOSIS — Z0000 Encounter for general adult medical examination without abnormal findings: Secondary | ICD-10-CM | POA: Diagnosis not present

## 2018-10-06 DIAGNOSIS — M1990 Unspecified osteoarthritis, unspecified site: Secondary | ICD-10-CM | POA: Diagnosis not present

## 2018-10-06 DIAGNOSIS — E559 Vitamin D deficiency, unspecified: Secondary | ICD-10-CM | POA: Diagnosis not present

## 2018-10-06 DIAGNOSIS — J310 Chronic rhinitis: Secondary | ICD-10-CM | POA: Diagnosis not present

## 2018-10-06 DIAGNOSIS — Z1389 Encounter for screening for other disorder: Secondary | ICD-10-CM | POA: Diagnosis not present

## 2018-10-06 DIAGNOSIS — R000 Tachycardia, unspecified: Secondary | ICD-10-CM | POA: Diagnosis not present

## 2018-10-06 DIAGNOSIS — Z86010 Personal history of colonic polyps: Secondary | ICD-10-CM | POA: Diagnosis not present

## 2018-10-18 ENCOUNTER — Ambulatory Visit (INDEPENDENT_AMBULATORY_CARE_PROVIDER_SITE_OTHER): Payer: Medicare Other | Admitting: Cardiology

## 2018-10-18 ENCOUNTER — Ambulatory Visit: Payer: Medicare Other | Admitting: Cardiology

## 2018-10-18 ENCOUNTER — Encounter: Payer: Self-pay | Admitting: Cardiology

## 2018-10-18 VITALS — BP 120/80 | HR 78 | Ht 70.0 in | Wt 174.2 lb

## 2018-10-18 DIAGNOSIS — I471 Supraventricular tachycardia: Secondary | ICD-10-CM | POA: Diagnosis not present

## 2018-10-18 DIAGNOSIS — R9431 Abnormal electrocardiogram [ECG] [EKG]: Secondary | ICD-10-CM

## 2018-10-18 NOTE — Progress Notes (Signed)
Cardiology Office Note:    Date:  10/18/2018   ID:  Gary Rice, DOB 1931-05-15, MRN [id number]  PCP:  Josetta Huddle, MD  Cardiologist:  No primary care provider on file.  Electrophysiologist:  None   Referring MD: Josetta Huddle, MD     History of Present Illness:    Gary Rice is a 85 y.o. male here for follow-up of SVT.  Had a atrial tachycardia heart rate [place].  Mild fluttering.  No anginal symptoms.  2 prior episodes to this.  He was at a [employer] for her son, anxious, heart rate was 145.  Overall no fevers chills nausea vomiting syncope bleeding.  Gary Rice is his wife.  Does well.  History reviewed. No pertinent past medical history.  Past Surgical History:  Procedure Laterality Date  . APPENDECTOMY     age 45  . CATARACT EXTRACTION, BILATERAL    . HERNIA REPAIR     RIH  . JOINT REPLACEMENT Right    RTHA  . TONSILLECTOMY      Current Medications: Current Meds  Medication Sig  . aspirin EC 81 MG tablet Take 81 mg daily by mouth.  Marland Kitchen atorvastatin (LIPITOR) 10 MG tablet Take 10 mg by mouth daily.  Marland Kitchen diltiazem (CARDIZEM) 30 MG tablet TAKE 1 TABLET AS NEEDED FOR RAPID HEART BEAT  . fluticasone (FLONASE) 50 MCG/ACT nasal spray Place 1 spray into both nostrils daily as needed for allergies.   . Multiple Vitamin (MULTIVITAMIN WITH MINERALS) TABS tablet Take 1 tablet by mouth daily.  . Multiple Vitamins-Minerals (PRESERVISION AREDS 2) CAPS Take 1 capsule 2 (two) times daily by mouth.  Marland Kitchen VITAMIN E PO Take 1 capsule by mouth daily.      Allergies:   Patient has no known allergies.   Social History   Socioeconomic History  . Marital status: Married    Spouse name: Not on file  . Number of children: Not on file  . Years of education: Not on file  . Highest education level: Not on file  Occupational History  . Not on file  Social Needs  . Financial resource strain: Not on file  . Food insecurity:    Worry: Not on file    Inability:  Not on file  . Transportation needs:    Medical: Not on file    Non-medical: Not on file  Tobacco Use  . Smoking status: Former Smoker    Types: Cigarettes    Last attempt to quit: 10/24/1950    Years since quitting: 68.0  . Smokeless tobacco: Never Used  Substance and Sexual Activity  . Alcohol use: No  . Drug use: No  . Sexual activity: Not on file  Lifestyle  . Physical activity:    Days per week: Not on file    Minutes per session: Not on file  . Stress: Not on file  Relationships  . Social connections:    Talks on phone: Not on file    Gets together: Not on file    Attends religious service: Not on file    Active member of club or organization: Not on file    Attends meetings of clubs or organizations: Not on file    Relationship status: Not on file  Other Topics Concern  . Not on file  Social History Narrative  . Not on file     Family History: The patient's family history includes Heart attack in his father and son; Heart disease in his  father; Sleep apnea in his son.  ROS:   Please see the history of present illness.    Denies any fevers chills nausea vomiting syncope bleeding all other systems reviewed and are negative.  EKGs/Labs/Other Studies Reviewed:    The following studies were reviewed today:   EKG:  EKG is  ordered today.  The ekg ordered today demonstrates 10/18/2018-normal sinus rhythm 78 early transition R waves.  Overall no significant change from prior.  Recent Labs: No results found for requested labs within last 8760 hours.  Recent Lipid Panel No results found for: CHOL, TRIG, HDL, CHOLHDL, VLDL, LDLCALC, LDLDIRECT  Physical Exam:    VS:  BP 120/80   Pulse 78   Ht 5\' 10"  (1.778 m)   Wt 174 lb 3.2 oz (79 kg)   SpO2 97%   BMI 25.00 kg/m     Wt Readings from Last 3 Encounters:  10/18/18 174 lb 3.2 oz (79 kg)  10/08/17 179 lb (81.2 kg)  08/10/17 179 lb 12.8 oz (81.6 kg)     GEN:  Well nourished, well developed in no acute  distress HEENT: Normal NECK: No JVD; No carotid bruits LYMPHATICS: No lymphadenopathy CARDIAC: RRR, no murmurs, rubs, gallops RESPIRATORY:  Clear to auscultation without rales, wheezing or rhonchi  ABDOMEN: Soft, non-tender, non-distended MUSCULOSKELETAL:  No edema; No deformity  SKIN: Warm and dry NEUROLOGIC:  Alert and oriented x 3 PSYCHIATRIC:  Normal affect   ASSESSMENT:    1. SVT (supraventricular tachycardia) ([place])   2. Abnormal EKG    PLAN:    In order of problems listed above:  Supraventricular tachycardia - Possibly 3 separate episodes, main one was the day of memorial for son.  Flutter-like sensation.  Can take diltiazem as needed.  EP if necessary. Relatively asymptomatic. SVT resolved in ambulance. Not aware of any other overall he is been doing quite well.  No recurrent episodes.  At this time, I am comfortable with him coming back on as-needed basis.  We will continue to follow his wife Gary Rice.   Medication Adjustments/Labs and Tests Ordered: Current medicines are reviewed at length with the patient today.  Concerns regarding medicines are outlined above.  Orders Placed This Encounter  Procedures  . EKG 12-Lead   No orders of the defined types were placed in this encounter.   Patient Instructions  Medication Instructions:  The current medical regimen is effective;  continue present plan and medications.  Follow-Up: Follow up as needed with Dr Marlou Porch.  Thank you for choosing [facility]!!        Signed, Candee Furbish, MD  10/18/2018 12:26 PM    [place]

## 2018-10-18 NOTE — Patient Instructions (Signed)
Medication Instructions:  The current medical regimen is effective;  continue present plan and medications.  Follow-Up: Follow up as needed with Dr Skains.  Thank you for choosing [facility]!!     

## 2019-06-30 DIAGNOSIS — Z23 Encounter for immunization: Secondary | ICD-10-CM | POA: Diagnosis not present

## 2020-02-15 DIAGNOSIS — L259 Unspecified contact dermatitis, unspecified cause: Secondary | ICD-10-CM | POA: Diagnosis not present

## 2020-02-15 DIAGNOSIS — J310 Chronic rhinitis: Secondary | ICD-10-CM | POA: Diagnosis not present

## 2020-02-15 DIAGNOSIS — R000 Tachycardia, unspecified: Secondary | ICD-10-CM | POA: Diagnosis not present

## 2020-02-15 DIAGNOSIS — Z1389 Encounter for screening for other disorder: Secondary | ICD-10-CM | POA: Diagnosis not present

## 2020-02-15 DIAGNOSIS — E7800 Pure hypercholesterolemia, unspecified: Secondary | ICD-10-CM | POA: Diagnosis not present

## 2020-02-15 DIAGNOSIS — Z79899 Other long term (current) drug therapy: Secondary | ICD-10-CM | POA: Diagnosis not present

## 2020-02-15 DIAGNOSIS — M1990 Unspecified osteoarthritis, unspecified site: Secondary | ICD-10-CM | POA: Diagnosis not present

## 2020-02-15 DIAGNOSIS — Z86010 Personal history of colonic polyps: Secondary | ICD-10-CM | POA: Diagnosis not present

## 2020-02-15 DIAGNOSIS — E559 Vitamin D deficiency, unspecified: Secondary | ICD-10-CM | POA: Diagnosis not present

## 2020-02-15 DIAGNOSIS — Z0000 Encounter for general adult medical examination without abnormal findings: Secondary | ICD-10-CM | POA: Diagnosis not present

## 2020-06-26 DIAGNOSIS — Z23 Encounter for immunization: Secondary | ICD-10-CM | POA: Diagnosis not present

## 2020-07-24 DIAGNOSIS — Z23 Encounter for immunization: Secondary | ICD-10-CM | POA: Diagnosis not present

## 2021-02-05 DIAGNOSIS — H18513 Endothelial corneal dystrophy, bilateral: Secondary | ICD-10-CM | POA: Diagnosis not present

## 2021-02-05 DIAGNOSIS — H35371 Puckering of macula, right eye: Secondary | ICD-10-CM | POA: Diagnosis not present

## 2021-02-05 DIAGNOSIS — H353132 Nonexudative age-related macular degeneration, bilateral, intermediate dry stage: Secondary | ICD-10-CM | POA: Diagnosis not present

## 2021-02-19 DIAGNOSIS — Z86010 Personal history of colonic polyps: Secondary | ICD-10-CM | POA: Diagnosis not present

## 2021-02-19 DIAGNOSIS — E7800 Pure hypercholesterolemia, unspecified: Secondary | ICD-10-CM | POA: Diagnosis not present

## 2021-02-19 DIAGNOSIS — M1990 Unspecified osteoarthritis, unspecified site: Secondary | ICD-10-CM | POA: Diagnosis not present

## 2021-02-19 DIAGNOSIS — Z0000 Encounter for general adult medical examination without abnormal findings: Secondary | ICD-10-CM | POA: Diagnosis not present

## 2021-02-19 DIAGNOSIS — L989 Disorder of the skin and subcutaneous tissue, unspecified: Secondary | ICD-10-CM | POA: Diagnosis not present

## 2021-02-19 DIAGNOSIS — R000 Tachycardia, unspecified: Secondary | ICD-10-CM | POA: Diagnosis not present

## 2021-02-19 DIAGNOSIS — E559 Vitamin D deficiency, unspecified: Secondary | ICD-10-CM | POA: Diagnosis not present

## 2021-02-19 DIAGNOSIS — Z79899 Other long term (current) drug therapy: Secondary | ICD-10-CM | POA: Diagnosis not present

## 2021-02-19 DIAGNOSIS — L259 Unspecified contact dermatitis, unspecified cause: Secondary | ICD-10-CM | POA: Diagnosis not present

## 2021-02-19 DIAGNOSIS — Z1389 Encounter for screening for other disorder: Secondary | ICD-10-CM | POA: Diagnosis not present

## 2021-02-19 DIAGNOSIS — J310 Chronic rhinitis: Secondary | ICD-10-CM | POA: Diagnosis not present

## 2021-02-26 DIAGNOSIS — Z23 Encounter for immunization: Secondary | ICD-10-CM | POA: Diagnosis not present

## 2021-05-02 ENCOUNTER — Inpatient Hospital Stay (HOSPITAL_COMMUNITY)
Admission: EM | Admit: 2021-05-02 | Discharge: 2021-05-26 | DRG: 329 | Disposition: A | Discharge status: Rehabilitation Facility | Payer: Medicare Other | Attending: Internal Medicine | Admitting: Internal Medicine

## 2021-05-02 ENCOUNTER — Inpatient Hospital Stay (HOSPITAL_COMMUNITY): Payer: Medicare Other

## 2021-05-02 ENCOUNTER — Inpatient Hospital Stay (HOSPITAL_COMMUNITY): Payer: Medicare Other | Admitting: Anesthesiology

## 2021-05-02 ENCOUNTER — Emergency Department (HOSPITAL_COMMUNITY): Payer: Medicare Other

## 2021-05-02 ENCOUNTER — Encounter (HOSPITAL_COMMUNITY): Payer: Self-pay | Admitting: Internal Medicine

## 2021-05-02 ENCOUNTER — Encounter (HOSPITAL_COMMUNITY)
Admission: EM | Disposition: A | Discharge status: Rehabilitation Facility | Payer: Self-pay | Source: Home / Self Care | Attending: Internal Medicine

## 2021-05-02 ENCOUNTER — Other Ambulatory Visit: Payer: Self-pay

## 2021-05-02 DIAGNOSIS — K9189 Other postprocedural complications and disorders of digestive system: Secondary | ICD-10-CM | POA: Diagnosis not present

## 2021-05-02 DIAGNOSIS — D62 Acute posthemorrhagic anemia: Secondary | ICD-10-CM | POA: Diagnosis not present

## 2021-05-02 DIAGNOSIS — Z978 Presence of other specified devices: Secondary | ICD-10-CM | POA: Diagnosis not present

## 2021-05-02 DIAGNOSIS — E871 Hypo-osmolality and hyponatremia: Secondary | ICD-10-CM | POA: Diagnosis present

## 2021-05-02 DIAGNOSIS — I480 Paroxysmal atrial fibrillation: Secondary | ICD-10-CM | POA: Diagnosis present

## 2021-05-02 DIAGNOSIS — Z9841 Cataract extraction status, right eye: Secondary | ICD-10-CM | POA: Diagnosis not present

## 2021-05-02 DIAGNOSIS — K651 Peritoneal abscess: Secondary | ICD-10-CM

## 2021-05-02 DIAGNOSIS — Z20822 Contact with and (suspected) exposure to covid-19: Secondary | ICD-10-CM | POA: Diagnosis present

## 2021-05-02 DIAGNOSIS — K8020 Calculus of gallbladder without cholecystitis without obstruction: Secondary | ICD-10-CM | POA: Diagnosis not present

## 2021-05-02 DIAGNOSIS — K219 Gastro-esophageal reflux disease without esophagitis: Secondary | ICD-10-CM | POA: Diagnosis present

## 2021-05-02 DIAGNOSIS — E440 Moderate protein-calorie malnutrition: Secondary | ICD-10-CM | POA: Insufficient documentation

## 2021-05-02 DIAGNOSIS — Z1624 Resistance to multiple antibiotics: Secondary | ICD-10-CM | POA: Diagnosis present

## 2021-05-02 DIAGNOSIS — T8143XD Infection following a procedure, organ and space surgical site, subsequent encounter: Secondary | ICD-10-CM | POA: Diagnosis not present

## 2021-05-02 DIAGNOSIS — Z7901 Long term (current) use of anticoagulants: Secondary | ICD-10-CM | POA: Diagnosis not present

## 2021-05-02 DIAGNOSIS — K551 Chronic vascular disorders of intestine: Secondary | ICD-10-CM | POA: Diagnosis present

## 2021-05-02 DIAGNOSIS — E861 Hypovolemia: Secondary | ICD-10-CM | POA: Diagnosis present

## 2021-05-02 DIAGNOSIS — Z8249 Family history of ischemic heart disease and other diseases of the circulatory system: Secondary | ICD-10-CM | POA: Diagnosis not present

## 2021-05-02 DIAGNOSIS — K55019 Acute (reversible) ischemia of small intestine, extent unspecified: Secondary | ICD-10-CM | POA: Diagnosis not present

## 2021-05-02 DIAGNOSIS — L7682 Other postprocedural complications of skin and subcutaneous tissue: Secondary | ICD-10-CM | POA: Diagnosis not present

## 2021-05-02 DIAGNOSIS — K460 Unspecified abdominal hernia with obstruction, without gangrene: Secondary | ICD-10-CM | POA: Diagnosis not present

## 2021-05-02 DIAGNOSIS — H9190 Unspecified hearing loss, unspecified ear: Secondary | ICD-10-CM | POA: Diagnosis present

## 2021-05-02 DIAGNOSIS — K7689 Other specified diseases of liver: Secondary | ICD-10-CM | POA: Diagnosis not present

## 2021-05-02 DIAGNOSIS — K56609 Unspecified intestinal obstruction, unspecified as to partial versus complete obstruction: Secondary | ICD-10-CM

## 2021-05-02 DIAGNOSIS — Z96641 Presence of right artificial hip joint: Secondary | ICD-10-CM | POA: Diagnosis present

## 2021-05-02 DIAGNOSIS — E46 Unspecified protein-calorie malnutrition: Secondary | ICD-10-CM | POA: Diagnosis present

## 2021-05-02 DIAGNOSIS — K650 Generalized (acute) peritonitis: Secondary | ICD-10-CM | POA: Diagnosis not present

## 2021-05-02 DIAGNOSIS — I4891 Unspecified atrial fibrillation: Secondary | ICD-10-CM

## 2021-05-02 DIAGNOSIS — B966 Bacteroides fragilis [B. fragilis] as the cause of diseases classified elsewhere: Secondary | ICD-10-CM | POA: Diagnosis present

## 2021-05-02 DIAGNOSIS — Z1611 Resistance to penicillins: Secondary | ICD-10-CM | POA: Diagnosis present

## 2021-05-02 DIAGNOSIS — R0609 Other forms of dyspnea: Secondary | ICD-10-CM | POA: Diagnosis not present

## 2021-05-02 DIAGNOSIS — D72829 Elevated white blood cell count, unspecified: Secondary | ICD-10-CM | POA: Diagnosis not present

## 2021-05-02 DIAGNOSIS — Z452 Encounter for adjustment and management of vascular access device: Secondary | ICD-10-CM | POA: Diagnosis not present

## 2021-05-02 DIAGNOSIS — Z4682 Encounter for fitting and adjustment of non-vascular catheter: Secondary | ICD-10-CM | POA: Diagnosis not present

## 2021-05-02 DIAGNOSIS — Z95828 Presence of other vascular implants and grafts: Secondary | ICD-10-CM

## 2021-05-02 DIAGNOSIS — K810 Acute cholecystitis: Secondary | ICD-10-CM | POA: Diagnosis present

## 2021-05-02 DIAGNOSIS — K559 Vascular disorder of intestine, unspecified: Secondary | ICD-10-CM | POA: Diagnosis not present

## 2021-05-02 DIAGNOSIS — K8000 Calculus of gallbladder with acute cholecystitis without obstruction: Secondary | ICD-10-CM | POA: Diagnosis present

## 2021-05-02 DIAGNOSIS — K4030 Unilateral inguinal hernia, with obstruction, without gangrene, not specified as recurrent: Secondary | ICD-10-CM | POA: Diagnosis present

## 2021-05-02 DIAGNOSIS — R7303 Prediabetes: Secondary | ICD-10-CM | POA: Diagnosis present

## 2021-05-02 DIAGNOSIS — Z7982 Long term (current) use of aspirin: Secondary | ICD-10-CM

## 2021-05-02 DIAGNOSIS — K8010 Calculus of gallbladder with chronic cholecystitis without obstruction: Secondary | ICD-10-CM | POA: Diagnosis present

## 2021-05-02 DIAGNOSIS — L270 Generalized skin eruption due to drugs and medicaments taken internally: Secondary | ICD-10-CM | POA: Diagnosis not present

## 2021-05-02 DIAGNOSIS — E785 Hyperlipidemia, unspecified: Secondary | ICD-10-CM | POA: Diagnosis present

## 2021-05-02 DIAGNOSIS — J90 Pleural effusion, not elsewhere classified: Secondary | ICD-10-CM | POA: Diagnosis not present

## 2021-05-02 DIAGNOSIS — R109 Unspecified abdominal pain: Secondary | ICD-10-CM | POA: Diagnosis not present

## 2021-05-02 DIAGNOSIS — Z9842 Cataract extraction status, left eye: Secondary | ICD-10-CM | POA: Diagnosis not present

## 2021-05-02 DIAGNOSIS — R933 Abnormal findings on diagnostic imaging of other parts of digestive tract: Secondary | ICD-10-CM | POA: Diagnosis not present

## 2021-05-02 DIAGNOSIS — R112 Nausea with vomiting, unspecified: Secondary | ICD-10-CM | POA: Diagnosis present

## 2021-05-02 DIAGNOSIS — Z8679 Personal history of other diseases of the circulatory system: Secondary | ICD-10-CM | POA: Diagnosis not present

## 2021-05-02 DIAGNOSIS — Y838 Other surgical procedures as the cause of abnormal reaction of the patient, or of later complication, without mention of misadventure at the time of the procedure: Secondary | ICD-10-CM | POA: Diagnosis present

## 2021-05-02 DIAGNOSIS — T829XXA Unspecified complication of cardiac and vascular prosthetic device, implant and graft, initial encounter: Secondary | ICD-10-CM

## 2021-05-02 DIAGNOSIS — K828 Other specified diseases of gallbladder: Secondary | ICD-10-CM | POA: Diagnosis not present

## 2021-05-02 DIAGNOSIS — R932 Abnormal findings on diagnostic imaging of liver and biliary tract: Secondary | ICD-10-CM | POA: Diagnosis not present

## 2021-05-02 DIAGNOSIS — K829 Disease of gallbladder, unspecified: Secondary | ICD-10-CM

## 2021-05-02 DIAGNOSIS — K567 Ileus, unspecified: Secondary | ICD-10-CM | POA: Diagnosis not present

## 2021-05-02 DIAGNOSIS — R490 Dysphonia: Secondary | ICD-10-CM | POA: Diagnosis present

## 2021-05-02 DIAGNOSIS — Z85828 Personal history of other malignant neoplasm of skin: Secondary | ICD-10-CM | POA: Diagnosis not present

## 2021-05-02 DIAGNOSIS — J9811 Atelectasis: Secondary | ICD-10-CM | POA: Diagnosis not present

## 2021-05-02 DIAGNOSIS — I712 Thoracic aortic aneurysm, without rupture, unspecified: Secondary | ICD-10-CM

## 2021-05-02 DIAGNOSIS — T8141XA Infection following a procedure, superficial incisional surgical site, initial encounter: Secondary | ICD-10-CM | POA: Diagnosis not present

## 2021-05-02 DIAGNOSIS — K660 Peritoneal adhesions (postprocedural) (postinfection): Secondary | ICD-10-CM | POA: Diagnosis not present

## 2021-05-02 DIAGNOSIS — Z87891 Personal history of nicotine dependence: Secondary | ICD-10-CM

## 2021-05-02 DIAGNOSIS — E876 Hypokalemia: Secondary | ICD-10-CM | POA: Diagnosis present

## 2021-05-02 DIAGNOSIS — Z6824 Body mass index (BMI) 24.0-24.9, adult: Secondary | ICD-10-CM

## 2021-05-02 DIAGNOSIS — I4892 Unspecified atrial flutter: Secondary | ICD-10-CM | POA: Diagnosis present

## 2021-05-02 DIAGNOSIS — R5381 Other malaise: Secondary | ICD-10-CM | POA: Diagnosis not present

## 2021-05-02 DIAGNOSIS — Z0189 Encounter for other specified special examinations: Secondary | ICD-10-CM

## 2021-05-02 DIAGNOSIS — R748 Abnormal levels of other serum enzymes: Secondary | ICD-10-CM | POA: Diagnosis not present

## 2021-05-02 DIAGNOSIS — K6389 Other specified diseases of intestine: Secondary | ICD-10-CM | POA: Diagnosis not present

## 2021-05-02 DIAGNOSIS — B999 Unspecified infectious disease: Secondary | ICD-10-CM | POA: Diagnosis present

## 2021-05-02 DIAGNOSIS — K838 Other specified diseases of biliary tract: Secondary | ICD-10-CM | POA: Diagnosis not present

## 2021-05-02 DIAGNOSIS — Z79899 Other long term (current) drug therapy: Secondary | ICD-10-CM

## 2021-05-02 DIAGNOSIS — K6811 Postprocedural retroperitoneal abscess: Secondary | ICD-10-CM | POA: Diagnosis not present

## 2021-05-02 DIAGNOSIS — R0602 Shortness of breath: Secondary | ICD-10-CM | POA: Diagnosis not present

## 2021-05-02 DIAGNOSIS — R0600 Dyspnea, unspecified: Secondary | ICD-10-CM | POA: Diagnosis not present

## 2021-05-02 DIAGNOSIS — N281 Cyst of kidney, acquired: Secondary | ICD-10-CM | POA: Diagnosis not present

## 2021-05-02 HISTORY — DX: Nonexudative age-related macular degeneration, unspecified eye, stage unspecified: H35.3190

## 2021-05-02 HISTORY — DX: Basal cell carcinoma of skin, unspecified: C44.91

## 2021-05-02 HISTORY — PX: INGUINAL HERNIA REPAIR: SHX194

## 2021-05-02 HISTORY — DX: Unilateral inguinal hernia, without obstruction or gangrene, not specified as recurrent: K40.90

## 2021-05-02 HISTORY — DX: Supraventricular tachycardia, unspecified: I47.10

## 2021-05-02 HISTORY — DX: Unspecified astigmatism, unspecified eye: H52.209

## 2021-05-02 HISTORY — DX: Supraventricular tachycardia: I47.1

## 2021-05-02 LAB — COMPREHENSIVE METABOLIC PANEL
ALT: 20 U/L (ref 0–44)
AST: 31 U/L (ref 15–41)
Albumin: 4.1 g/dL (ref 3.5–5.0)
Alkaline Phosphatase: 63 U/L (ref 38–126)
Anion gap: 12 (ref 5–15)
BUN: 21 mg/dL (ref 8–23)
CO2: 24 mmol/L (ref 22–32)
Calcium: 10.3 mg/dL (ref 8.9–10.3)
Chloride: 96 mmol/L — ABNORMAL LOW (ref 98–111)
Creatinine, Ser: 1.03 mg/dL (ref 0.61–1.24)
GFR, Estimated: >60 mL/min (ref >60)
Glucose, Bld: 124 mg/dL — ABNORMAL HIGH (ref 70–99)
Potassium: 4.1 mmol/L (ref 3.5–5.1)
Sodium: 132 mmol/L — ABNORMAL LOW (ref 135–145)
Total Bilirubin: 1.3 mg/dL — ABNORMAL HIGH (ref 0.3–1.2)
Total Protein: 8 g/dL (ref 6.5–8.1)

## 2021-05-02 LAB — I-STAT CHEM 8, ED
BUN: 22 mg/dL (ref 8–23)
Calcium, Ion: 1.14 mmol/L — ABNORMAL LOW (ref 1.15–1.40)
Chloride: 98 mmol/L (ref 98–111)
Creatinine, Ser: 1 mg/dL (ref 0.61–1.24)
Glucose, Bld: 128 mg/dL — ABNORMAL HIGH (ref 70–99)
HCT: 50 % (ref 39.0–52.0)
Hemoglobin: 17 g/dL (ref 13.0–17.0)
Potassium: 4.1 mmol/L (ref 3.5–5.1)
Sodium: 134 mmol/L — ABNORMAL LOW (ref 135–145)
TCO2: 26 mmol/L (ref 22–32)

## 2021-05-02 LAB — LIPASE, BLOOD: Lipase: 27 U/L (ref 11–51)

## 2021-05-02 LAB — CBC WITH DIFFERENTIAL/PLATELET
Abs Immature Granulocytes: 0.06 10*3/uL (ref 0.00–0.07)
Basophils Absolute: 0 10*3/uL (ref 0.0–0.1)
Basophils Relative: 0 %
Eosinophils Absolute: 0 10*3/uL (ref 0.0–0.5)
Eosinophils Relative: 0 %
HCT: 49.2 % (ref 39.0–52.0)
Hemoglobin: 16.8 g/dL (ref 13.0–17.0)
Immature Granulocytes: 0 %
Lymphocytes Relative: 12 %
Lymphs Abs: 2.2 10*3/uL (ref 0.7–4.0)
MCH: 31.6 pg (ref 26.0–34.0)
MCHC: 34.1 g/dL (ref 30.0–36.0)
MCV: 92.7 fL (ref 80.0–100.0)
Monocytes Absolute: 2 10*3/uL — ABNORMAL HIGH (ref 0.1–1.0)
Monocytes Relative: 11 %
Neutro Abs: 13.7 10*3/uL — ABNORMAL HIGH (ref 1.7–7.7)
Neutrophils Relative %: 77 %
Platelets: 226 10*3/uL (ref 150–400)
RBC: 5.31 MIL/uL (ref 4.22–5.81)
RDW: 12.9 % (ref 11.5–15.5)
WBC: 18 10*3/uL — ABNORMAL HIGH (ref 4.0–10.5)
nRBC: 0 % (ref 0.0–0.2)

## 2021-05-02 LAB — RESP PANEL BY RT-PCR (FLU A&B, COVID) ARPGX2
Influenza A by PCR: NEGATIVE
Influenza B by PCR: NEGATIVE
SARS Coronavirus 2 by RT PCR: NEGATIVE

## 2021-05-02 LAB — TROPONIN I (HIGH SENSITIVITY)
Troponin I (High Sensitivity): 17 ng/L (ref <18)
Troponin I (High Sensitivity): 20 ng/L — ABNORMAL HIGH (ref <18)

## 2021-05-02 LAB — MAGNESIUM: Magnesium: 2.1 mg/dL (ref 1.7–2.4)

## 2021-05-02 SURGERY — REPAIR, HERNIA, INGUINAL, INCARCERATED
Anesthesia: General | Site: Inguinal | Laterality: Right

## 2021-05-02 MED ORDER — ONDANSETRON HCL 4 MG/2ML IJ SOLN
INTRAMUSCULAR | Status: AC
  Filled 2021-05-02: qty 2

## 2021-05-02 MED ORDER — ARTIFICIAL TEARS OPHTHALMIC OINT
TOPICAL_OINTMENT | OPHTHALMIC | Status: AC
  Filled 2021-05-02: qty 10.5

## 2021-05-02 MED ORDER — IOHEXOL 350 MG/ML SOLN
100.0000 mL | Freq: Once | INTRAVENOUS | Status: AC | PRN
  Administered 2021-05-02: 100 mL via INTRAVENOUS

## 2021-05-02 MED ORDER — ETOMIDATE 2 MG/ML IV SOLN
INTRAVENOUS | Status: DC | PRN
  Administered 2021-05-02: 10 mg via INTRAVENOUS

## 2021-05-02 MED ORDER — SODIUM CHLORIDE 0.9 % IV BOLUS
1000.0000 mL | Freq: Once | INTRAVENOUS | Status: AC
  Administered 2021-05-02: 1000 mL via INTRAVENOUS

## 2021-05-02 MED ORDER — FENTANYL CITRATE (PF) 100 MCG/2ML IJ SOLN: 25.0000 ug | INTRAMUSCULAR | Status: DC | PRN

## 2021-05-02 MED ORDER — CEFAZOLIN SODIUM 1 G IJ SOLR
INTRAMUSCULAR | Status: AC
  Filled 2021-05-02: qty 20

## 2021-05-02 MED ORDER — BUPIVACAINE-EPINEPHRINE (PF) 0.25% -1:200000 IJ SOLN
INTRAMUSCULAR | Status: AC
  Filled 2021-05-02: qty 30

## 2021-05-02 MED ORDER — ALBUMIN HUMAN 5 % IV SOLN: INTRAVENOUS | Status: DC | PRN

## 2021-05-02 MED ORDER — CEFAZOLIN SODIUM-DEXTROSE 2-3 GM-%(50ML) IV SOLR
INTRAVENOUS | Status: DC | PRN
  Administered 2021-05-02: 2 g via INTRAVENOUS

## 2021-05-02 MED ORDER — DILTIAZEM HCL-DEXTROSE 125-5 MG/125ML-% IV SOLN (PREMIX)
5.0000 mg/h | INTRAVENOUS | Status: AC
  Administered 2021-05-02: 5 mg/h via INTRAVENOUS
  Administered 2021-05-03: 10 mg/h via INTRAVENOUS
  Administered 2021-05-03: 5 mg/h via INTRAVENOUS
  Administered 2021-05-04 – 2021-05-10 (×8): 10 mg/h via INTRAVENOUS
  Administered 2021-05-11: 15 mg/h via INTRAVENOUS
  Administered 2021-05-11: 10 mg/h via INTRAVENOUS
  Administered 2021-05-12 – 2021-05-16 (×10): 15 mg/h via INTRAVENOUS
  Filled 2021-05-02 (×30): qty 125

## 2021-05-02 MED ORDER — MORPHINE SULFATE (PF) 4 MG/ML IV SOLN
4.0000 mg | Freq: Once | INTRAVENOUS | Status: AC
Start: 2021-05-02 — End: 2021-05-02
  Administered 2021-05-02: 4 mg via INTRAVENOUS
  Filled 2021-05-02: qty 1

## 2021-05-02 MED ORDER — ETOMIDATE 2 MG/ML IV SOLN
INTRAVENOUS | Status: AC
  Filled 2021-05-02: qty 10

## 2021-05-02 MED ORDER — ONDANSETRON HCL 4 MG/2ML IJ SOLN
INTRAMUSCULAR | Status: AC
  Filled 2021-05-02: qty 6

## 2021-05-02 MED ORDER — LACTATED RINGERS IV SOLN: INTRAVENOUS | Status: DC | PRN

## 2021-05-02 MED ORDER — METOPROLOL TARTRATE 5 MG/5ML IV SOLN
5.0000 mg | Freq: Once | INTRAVENOUS | Status: AC
  Administered 2021-05-02: 5 mg via INTRAVENOUS
  Filled 2021-05-02: qty 5

## 2021-05-02 MED ORDER — METOPROLOL TARTRATE 5 MG/5ML IV SOLN: 5.0000 mg | INTRAVENOUS | Status: DC | PRN

## 2021-05-02 MED ORDER — ROCURONIUM BROMIDE 10 MG/ML (PF) SYRINGE
PREFILLED_SYRINGE | INTRAVENOUS | Status: AC
  Filled 2021-05-02: qty 60

## 2021-05-02 MED ORDER — ACETAMINOPHEN 10 MG/ML IV SOLN
INTRAVENOUS | Status: AC
  Filled 2021-05-02: qty 100

## 2021-05-02 MED ORDER — SUCCINYLCHOLINE CHLORIDE 200 MG/10ML IV SOSY
PREFILLED_SYRINGE | INTRAVENOUS | Status: DC | PRN
  Administered 2021-05-02: 100 mg via INTRAVENOUS

## 2021-05-02 MED ORDER — ADENOSINE 6 MG/2ML IV SOLN
INTRAVENOUS | Status: AC
  Filled 2021-05-02: qty 4

## 2021-05-02 MED ORDER — PHENYLEPHRINE HCL-NACL 20-0.9 MG/250ML-% IV SOLN
INTRAVENOUS | Status: DC | PRN
  Administered 2021-05-02: 40 ug/min via INTRAVENOUS

## 2021-05-02 MED ORDER — SUCCINYLCHOLINE CHLORIDE 200 MG/10ML IV SOSY
PREFILLED_SYRINGE | INTRAVENOUS | Status: AC
  Filled 2021-05-02: qty 50

## 2021-05-02 MED ORDER — FENTANYL CITRATE (PF) 250 MCG/5ML IJ SOLN
INTRAMUSCULAR | Status: DC | PRN
  Administered 2021-05-02 (×3): 50 ug via INTRAVENOUS

## 2021-05-02 MED ORDER — ONDANSETRON HCL 4 MG/2ML IJ SOLN
INTRAMUSCULAR | Status: DC | PRN
  Administered 2021-05-02: 4 mg via INTRAVENOUS

## 2021-05-02 MED ORDER — LIDOCAINE HCL (CARDIAC) PF 100 MG/5ML IV SOSY
PREFILLED_SYRINGE | INTRAVENOUS | Status: DC | PRN
  Administered 2021-05-02: 40 mg via INTRATRACHEAL

## 2021-05-02 MED ORDER — SODIUM CHLORIDE 0.9 % IV BOLUS
500.0000 mL | Freq: Once | INTRAVENOUS | Status: AC
  Administered 2021-05-02: 500 mL via INTRAVENOUS

## 2021-05-02 MED ORDER — PHENYLEPHRINE 40 MCG/ML (10ML) SYRINGE FOR IV PUSH (FOR BLOOD PRESSURE SUPPORT)
PREFILLED_SYRINGE | INTRAVENOUS | Status: AC
  Filled 2021-05-02: qty 30

## 2021-05-02 MED ORDER — DILTIAZEM HCL 25 MG/5ML IV SOLN
10.0000 mg | Freq: Once | INTRAVENOUS | Status: AC
  Administered 2021-05-02: 10 mg via INTRAVENOUS
  Filled 2021-05-02: qty 5

## 2021-05-02 MED ORDER — 0.9 % SODIUM CHLORIDE (POUR BTL) OPTIME
TOPICAL | Status: DC | PRN
  Administered 2021-05-02: 1000 mL

## 2021-05-02 MED ORDER — LIDOCAINE 2% (20 MG/ML) 5 ML SYRINGE
INTRAMUSCULAR | Status: AC
  Filled 2021-05-02: qty 30

## 2021-05-02 MED ORDER — ROCURONIUM BROMIDE 100 MG/10ML IV SOLN
INTRAVENOUS | Status: DC | PRN
  Administered 2021-05-02: 50 mg via INTRAVENOUS

## 2021-05-02 MED ORDER — ACETAMINOPHEN 10 MG/ML IV SOLN
INTRAVENOUS | Status: DC | PRN
  Administered 2021-05-02: 1000 mg via INTRAVENOUS

## 2021-05-02 MED ORDER — FENTANYL CITRATE (PF) 250 MCG/5ML IJ SOLN
INTRAMUSCULAR | Status: AC
  Filled 2021-05-02: qty 5

## 2021-05-02 MED ORDER — BUPIVACAINE-EPINEPHRINE 0.25% -1:200000 IJ SOLN
INTRAMUSCULAR | Status: DC | PRN
  Administered 2021-05-02: 30 mL

## 2021-05-02 MED ORDER — METOPROLOL TARTRATE 5 MG/5ML IV SOLN
5.0000 mg | INTRAVENOUS | Status: DC | PRN
  Filled 2021-05-02: qty 5

## 2021-05-02 MED ORDER — EPHEDRINE 5 MG/ML INJ
INTRAVENOUS | Status: AC
  Filled 2021-05-02: qty 15

## 2021-05-02 SURGICAL SUPPLY — 50 items
ADH SKN CLS APL DERMABOND .7 (GAUZE/BANDAGES/DRESSINGS) ×1
APL PRP STRL LF DISP 70% ISPRP (MISCELLANEOUS) ×1
APL SKNCLS STERI-STRIP NONHPOA (GAUZE/BANDAGES/DRESSINGS)
BAG COUNTER SPONGE SURGICOUNT (BAG) ×2 IMPLANT
BAG SPNG CNTER NS LX DISP (BAG) ×1
BENZOIN TINCTURE PRP APPL 2/3 (GAUZE/BANDAGES/DRESSINGS) IMPLANT
BLADE CLIPPER SURG (BLADE) ×2 IMPLANT
CHLORAPREP W/TINT 26 (MISCELLANEOUS) ×2 IMPLANT
COVER SURGICAL LIGHT HANDLE (MISCELLANEOUS) ×2 IMPLANT
DECANTER SPIKE VIAL GLASS SM (MISCELLANEOUS) ×2 IMPLANT
DERMABOND ADVANCED (GAUZE/BANDAGES/DRESSINGS) ×1
DERMABOND ADVANCED .7 DNX12 (GAUZE/BANDAGES/DRESSINGS) ×1 IMPLANT
DRAIN PENROSE 1/2X12 LTX STRL (WOUND CARE) IMPLANT
DRAPE LAPAROSCOPIC ABDOMINAL (DRAPES) IMPLANT
DRAPE LAPAROTOMY TRNSV 102X78 (DRAPES) IMPLANT
DRSG TEGADERM 4X4.75 (GAUZE/BANDAGES/DRESSINGS) IMPLANT
ELECT CAUTERY BLADE 6.4 (BLADE) IMPLANT
ELECT REM PT RETURN 9FT ADLT (ELECTROSURGICAL) ×2
ELECTRODE REM PT RTRN 9FT ADLT (ELECTROSURGICAL) ×1 IMPLANT
GAUZE 4X4 16PLY ~~LOC~~+RFID DBL (SPONGE) ×4 IMPLANT
GAUZE SPONGE 4X4 12PLY STRL (GAUZE/BANDAGES/DRESSINGS) IMPLANT
GLOVE SURG ENC MOIS LTX SZ6.5 (GLOVE) ×2 IMPLANT
GLOVE SURG UNDER POLY LF SZ6 (GLOVE) ×2 IMPLANT
GOWN STRL REUS W/ TWL LRG LVL3 (GOWN DISPOSABLE) ×3 IMPLANT
GOWN STRL REUS W/TWL LRG LVL3 (GOWN DISPOSABLE) ×6
KIT BASIN OR (CUSTOM PROCEDURE TRAY) ×2 IMPLANT
KIT TURNOVER KIT B (KITS) ×2 IMPLANT
MESH ULTRAPRO 3X6 7.6X15CM (Mesh General) ×2 IMPLANT
NEEDLE HYPO 25GX1X1/2 BEV (NEEDLE) ×2 IMPLANT
NS IRRIG 1000ML POUR BTL (IV SOLUTION) ×2 IMPLANT
PACK GENERAL/GYN (CUSTOM PROCEDURE TRAY) ×2 IMPLANT
PAD ARMBOARD 7.5X6 YLW CONV (MISCELLANEOUS) ×2 IMPLANT
PENCIL SMOKE EVACUATOR (MISCELLANEOUS) ×2 IMPLANT
SPONGE INTESTINAL PEANUT (DISPOSABLE) IMPLANT
SPONGE T-LAP 18X18 ~~LOC~~+RFID (SPONGE) ×2 IMPLANT
SUT MNCRL AB 4-0 PS2 18 (SUTURE) ×2 IMPLANT
SUT NOVA NAB GS-21 0 18 T12 DT (SUTURE) IMPLANT
SUT PDS AB 0 CT 36 (SUTURE) IMPLANT
SUT SILK 0 SH 30 (SUTURE) IMPLANT
SUT SILK 2 0 SH (SUTURE) IMPLANT
SUT SILK 3 0 (SUTURE)
SUT SILK 3-0 18XBRD TIE 12 (SUTURE) IMPLANT
SUT VIC AB 0 CT2 27 (SUTURE) ×6 IMPLANT
SUT VIC AB 2-0 SH 27 (SUTURE) ×2
SUT VIC AB 2-0 SH 27X BRD (SUTURE) ×1 IMPLANT
SUT VIC AB 3-0 SH 27 (SUTURE) ×2
SUT VIC AB 3-0 SH 27XBRD (SUTURE) ×1 IMPLANT
SYR CONTROL 10ML LL (SYRINGE) IMPLANT
TOWEL GREEN STERILE (TOWEL DISPOSABLE) ×2 IMPLANT
TOWEL GREEN STERILE FF (TOWEL DISPOSABLE) ×2 IMPLANT

## 2021-05-02 NOTE — H&P (Signed)
History and Physical    Britton Heyser [facility] [id number] DOB: Sep 17, 1931 DOA: 05/02/2021  PCP: Josetta Huddle, MD Patient coming from: Home  Chief Complaint: Vomiting  HPI: Gary Rice is a 87 y.o. male with medical history significant of SVT, hyperlipidemia, appendectomy, bilateral inguinal hernias, previous hernia repair presented to the ED complaining of vomiting.  He reported that his right inguinal hernia had popped and he was sent by his doctor for evaluation of possible bowel obstruction.  In the ED, patient found to be in new onset A. fib with rate in the 160s.  Not hypoxic.  No fever.  Labs showing WBC 18.0, hemoglobin 16.8, platelet count 226.  Sodium 132, potassium 4.1, chloride 96, bicarb 24, BUN 21, creatinine 1.0, glucose 124.  No significant elevation of LFTs.  Lipase normal.  Initial high-sensitivity troponin negative, repeat pending.  Magnesium 2.1.  Chest x-ray showing no acute cardiopulmonary disease.  CTA chest negative for PE. His heart rate continued to be uncontrolled despite Cardizem loading dose and maximum dose infusion.  ED physician consulted cardiology who recommended giving IV Lopressor.  Cardiology will see the patient tonight.  CT showing findings consistent with mechanical SBO with transition point related to right inguinal hernia; no perforation.  Moderate fat and small bowel containing left inguinal hernia without adverse features.  ED physician was not able to reduce the inguinal hernia.  General surgery recommended NG tube, will consult.  Patient reports history of bilateral inguinal hernias.  States 2 days ago his right hernia popped and when he tried to push it back in he was not able to do it.  He started vomiting yesterday.  He was able to eat a few bites of food yesterday and today.  He did have a small bowel movement yesterday morning.  Today he went to his PCP who also tried to push the hernia back in but was not able to do so and he was advised to come  into the emergency room to be evaluated.  Patient states when he came into the emergency room he was told that his heart rate was high but he still has not felt any palpitations.  Denies lightheadedness or chest pain.  States in the past his heart rate was high and he was seen by a cardiologist and started on Cardizem which he does not take.  Denies history of hypertension.  No other complaints.  Review of Systems:  All systems reviewed and apart from history of presenting illness, are negative.  History reviewed. No pertinent past medical history.  Past Surgical History:  Procedure Laterality Date   APPENDECTOMY     age 33   CATARACT EXTRACTION, BILATERAL     HERNIA REPAIR     RIH   JOINT REPLACEMENT Right    RTHA   TONSILLECTOMY       reports that he quit smoking about 70 years ago. His smoking use included cigarettes. He has never used smokeless tobacco. He reports that he does not drink alcohol and does not use drugs.  No Known Allergies  Family History  Problem Relation Age of Onset   Heart disease Father    Heart attack Father    Heart attack Son    Sleep apnea Son     Prior to Admission medications   Medication Sig Start Date End Date Taking? Authorizing Provider  aspirin EC 81 MG tablet Take 81 mg daily by mouth.    [provider]  atorvastatin (LIPITOR) 10 MG tablet  Take 10 mg by mouth daily. 08/14/15   [provider]  diltiazem (CARDIZEM) 30 MG tablet TAKE 1 TABLET AS NEEDED FOR RAPID HEART BEAT 10/06/17   Isaiah Serge, NP  fluticasone [facility]) 50 MCG/ACT nasal spray Place 1 spray into both nostrils daily as needed for allergies.  08/14/15   [provider]  Multiple Vitamin (MULTIVITAMIN WITH MINERALS) TABS tablet Take 1 tablet by mouth daily.    [provider]  Multiple Vitamins-Minerals (PRESERVISION AREDS 2) CAPS Take 1 capsule 2 (two) times daily by mouth.    [provider]  VITAMIN E PO Take 1 capsule by mouth  daily.     [provider]    Physical Exam: Vitals:   05/02/21 2030 05/02/21 2045 05/02/21 2100 05/02/21 2120  BP: 97/73 114/88 104/70 97/74  Pulse: (!) 147 (!) 147 96 87  Resp: '19 15 17 13  '$ Temp:      TempSrc:      SpO2: 93% 94% 95% 96%  Weight:      Height:        Physical Exam Constitutional:      General: He is not in acute distress. HENT:     Head: Normocephalic and atraumatic.  Eyes:     Extraocular Movements: Extraocular movements intact.     Conjunctiva/sclera: Conjunctivae normal.  Cardiovascular:     Rate and Rhythm: Normal rate. Rhythm irregular.     Pulses: Normal pulses.  Pulmonary:     Effort: Pulmonary effort is normal. No respiratory distress.     Breath sounds: Normal breath sounds. No wheezing or rales.  Abdominal:     General: Bowel sounds are normal. There is no distension.     Palpations: Abdomen is soft.     Tenderness: There is no abdominal tenderness. There is no guarding or rebound.  Musculoskeletal:        General: No swelling or tenderness.     Cervical back: Normal range of motion and neck supple.  Skin:    General: Skin is warm and dry.  Neurological:     General: No focal deficit present.     Mental Status: He is alert and oriented to person, place, and time.     Labs on Admission: I have personally reviewed following labs and imaging studies  CBC: Recent Labs  Lab 05/02/21 1806 05/02/21 1814  WBC 18.0*  --   NEUTROABS 13.7*  --   HGB 16.8 17.0  HCT 49.2 50.0  MCV 92.7  --   PLT 226  --    Basic Metabolic Panel: Recent Labs  Lab 05/02/21 1806 05/02/21 1814  NA 132* 134*  K 4.1 4.1  CL 96* 98  CO2 24  --   GLUCOSE 124* 128*  BUN 21 22  CREATININE 1.03 1.00  CALCIUM 10.3  --   MG 2.1  --    GFR: Estimated Creatinine Clearance: 54.4 mL/min (by C-G formula based on SCr of 1 mg/dL). Liver Function Tests: Recent Labs  Lab 05/02/21 1806  AST 31  ALT 20  ALKPHOS 63  BILITOT 1.3*  PROT 8.0  ALBUMIN 4.1    Recent Labs  Lab 05/02/21 1806  LIPASE 27   No results for input(s): AMMONIA in the last 168 hours. Coagulation Profile: No results for input(s): INR, PROTIME in the last 168 hours. Cardiac Enzymes: No results for input(s): CKTOTAL, CKMB, CKMBINDEX, TROPONINI in the last 168 hours. BNP (last 3 results) No results for input(s): PROBNP in  the last 8760 hours. HbA1C: No results for input(s): HGBA1C in the last 72 hours. CBG: No results for input(s): GLUCAP in the last 168 hours. Lipid Profile: No results for input(s): CHOL, HDL, LDLCALC, TRIG, CHOLHDL, LDLDIRECT in the last 72 hours. Thyroid Function Tests: No results for input(s): TSH, T4TOTAL, FREET4, T3FREE, THYROIDAB in the last 72 hours. Anemia Panel: No results for input(s): VITAMINB12, FOLATE, FERRITIN, TIBC, IRON, RETICCTPCT in the last 72 hours. Urine analysis: No results found for: COLORURINE, APPEARANCEUR, LABSPEC, PHURINE, GLUCOSEU, HGBUR, BILIRUBINUR, KETONESUR, PROTEINUR, UROBILINOGEN, NITRITE, LEUKOCYTESUR  Radiological Exams on Admission: CT Angio Chest PE W and/or Wo Contrast  Result Date: 05/02/2021 CLINICAL DATA:  Blockage unable to tolerate p.o. EXAM: CT ANGIOGRAPHY CHEST CT ABDOMEN AND PELVIS WITH CONTRAST TECHNIQUE: Multidetector CT imaging of the chest was performed using the standard protocol during bolus administration of intravenous contrast. Multiplanar CT image reconstructions and MIPs were obtained to evaluate the vascular anatomy. Multidetector CT imaging of the abdomen and pelvis was performed using the standard protocol during bolus administration of intravenous contrast. CONTRAST:  164m OMNIPAQUE IOHEXOL 350 MG/ML SOLN COMPARISON:  Radiography 05/02/2021 FINDINGS: CTA CHEST FINDINGS Cardiovascular: Satisfactory opacification of the pulmonary arteries to the segmental level. No evidence of pulmonary embolism. Mild aortic atherosclerosis. Aneurysmal dilatation of the ascending aorta up to 4.8 cm. Normal  cardiac size. Mild coronary vascular calcification. No pericardial effusion Mediastinum/Nodes: No enlarged mediastinal, hilar, or axillary lymph nodes. Thyroid gland, trachea, and esophagus demonstrate no significant findings. Lungs/Pleura: Lungs are clear. No pleural effusion or pneumothorax. Musculoskeletal: No chest wall abnormality. No acute or significant osseous findings. Review of the MIP images confirms the above findings. CT ABDOMEN and PELVIS FINDINGS Hepatobiliary: Hyperdense sludge or excreted contrast within the gallbladder. No biliary dilatation. Subcentimeter hypodensities in the liver too small to further characterize. Small cyst in the left hepatic lobe. Pancreas: Unremarkable. No pancreatic ductal dilatation or surrounding inflammatory changes. Spleen: Normal in size without focal abnormality. Adrenals/Urinary Tract: Adrenal glands are normal. Bilateral renal cysts. 9.4 cm cyst in the upper pole left kidney. The bladder is unremarkable Stomach/Bowel: Fluid distension of the stomach, proximal and mid small bowel consistent with obstruction. Transition point related to right inguinal hernia containing fat and short segment of small bowel. Small bowel distal to the hernia is decompressed. No acute bowel wall thickening. Vascular/Lymphatic: Moderate aortic atherosclerosis. No aneurysm. No suspicious nodes Reproductive: Enlarged prostate Other: Moderate left inguinal hernia containing fat and decompressed small bowel. Musculoskeletal: Orthopedic hardware right hip with artifact. No acute osseous abnormality Review of the MIP images confirms the above findings. IMPRESSION: 1. Negative for acute pulmonary embolus.  Clear lung fields. 2. Findings consistent with mechanical small bowel obstruction, transition point related to right inguinal hernia. No perforation. 3. Moderate fat and small bowel containing left inguinal hernia without adverse features 4. Aneurysmal dilatation of the ascending aorta up to  4.8 cm. Ascending thoracic aortic aneurysm. Recommend semi-annual imaging followup by CTA or MRA and referral to cardiothoracic surgery if not already obtained. This recommendation follows 2010 ACCF/AHA/AATS/ACR/ASA/SCA/SCAI/SIR/STS/SVM Guidelines for the Diagnosis and Management of Patients With Thoracic Aortic Disease. Circulation. 2010; 121:[id number] Aortic aneurysm NOS (ICD10-I71.9) Electronically Signed   By: KDonavan FoilM.D.   On: 05/02/2021 19:56   CT ABDOMEN PELVIS W CONTRAST  Result Date: 05/02/2021 CLINICAL DATA:  Blockage unable to tolerate p.o. EXAM: CT ANGIOGRAPHY CHEST CT ABDOMEN AND PELVIS WITH CONTRAST TECHNIQUE: Multidetector CT imaging of the chest was performed using the standard protocol during bolus  administration of intravenous contrast. Multiplanar CT image reconstructions and MIPs were obtained to evaluate the vascular anatomy. Multidetector CT imaging of the abdomen and pelvis was performed using the standard protocol during bolus administration of intravenous contrast. CONTRAST:  163m OMNIPAQUE IOHEXOL 350 MG/ML SOLN COMPARISON:  Radiography 05/02/2021 FINDINGS: CTA CHEST FINDINGS Cardiovascular: Satisfactory opacification of the pulmonary arteries to the segmental level. No evidence of pulmonary embolism. Mild aortic atherosclerosis. Aneurysmal dilatation of the ascending aorta up to 4.8 cm. Normal cardiac size. Mild coronary vascular calcification. No pericardial effusion Mediastinum/Nodes: No enlarged mediastinal, hilar, or axillary lymph nodes. Thyroid gland, trachea, and esophagus demonstrate no significant findings. Lungs/Pleura: Lungs are clear. No pleural effusion or pneumothorax. Musculoskeletal: No chest wall abnormality. No acute or significant osseous findings. Review of the MIP images confirms the above findings. CT ABDOMEN and PELVIS FINDINGS Hepatobiliary: Hyperdense sludge or excreted contrast within the gallbladder. No biliary dilatation. Subcentimeter hypodensities  in the liver too small to further characterize. Small cyst in the left hepatic lobe. Pancreas: Unremarkable. No pancreatic ductal dilatation or surrounding inflammatory changes. Spleen: Normal in size without focal abnormality. Adrenals/Urinary Tract: Adrenal glands are normal. Bilateral renal cysts. 9.4 cm cyst in the upper pole left kidney. The bladder is unremarkable Stomach/Bowel: Fluid distension of the stomach, proximal and mid small bowel consistent with obstruction. Transition point related to right inguinal hernia containing fat and short segment of small bowel. Small bowel distal to the hernia is decompressed. No acute bowel wall thickening. Vascular/Lymphatic: Moderate aortic atherosclerosis. No aneurysm. No suspicious nodes Reproductive: Enlarged prostate Other: Moderate left inguinal hernia containing fat and decompressed small bowel. Musculoskeletal: Orthopedic hardware right hip with artifact. No acute osseous abnormality Review of the MIP images confirms the above findings. IMPRESSION: 1. Negative for acute pulmonary embolus.  Clear lung fields. 2. Findings consistent with mechanical small bowel obstruction, transition point related to right inguinal hernia. No perforation. 3. Moderate fat and small bowel containing left inguinal hernia without adverse features 4. Aneurysmal dilatation of the ascending aorta up to 4.8 cm. Ascending thoracic aortic aneurysm. Recommend semi-annual imaging followup by CTA or MRA and referral to cardiothoracic surgery if not already obtained. This recommendation follows 2010 ACCF/AHA/AATS/ACR/ASA/SCA/SCAI/SIR/STS/SVM Guidelines for the Diagnosis and Management of Patients With Thoracic Aortic Disease. Circulation. 2010; 121:[id number] Aortic aneurysm NOS (ICD10-I71.9) Electronically Signed   By: KDonavan FoilM.D.   On: 05/02/2021 19:56   DG Chest Port 1 View  Result Date: 05/02/2021 CLINICAL DATA:  Shortness of breath EXAM: PORTABLE CHEST 1 VIEW COMPARISON:   08/04/2007 FINDINGS: Heart is normal size. Tortuous, ectatic thoracic aorta. Lungs clear. No effusions. No acute bony abnormality. IMPRESSION: No acute cardiopulmonary disease. Electronically Signed   By: KRolm BaptiseM.D.   On: 05/02/2021 18:25   DG Abd Portable 1 View  Result Date: 05/02/2021 CLINICAL DATA:  Shortness of breath, abdominal pain EXAM: PORTABLE ABDOMEN - 1 VIEW COMPARISON:  None. FINDINGS: Nonspecific bowel gas pattern. Gas throughout nondistended large and small bowel. No evidence of bowel obstruction. No organomegaly, suspicious calcification or free air. Visualized lung bases clear. IMPRESSION: No evidence of bowel obstruction or free air. Electronically Signed   By: KRolm BaptiseM.D.   On: 05/02/2021 18:26    EKG: Independently reviewed.  A. fib with RVR.  Assessment/Plan Principal Problem:   Atrial fibrillation with rapid ventricular response (HCC) Active Problems:   SBO (small bowel obstruction) (HCC)   Leukocytosis   Hyperlipidemia   [place]acic [place]   New onset  A. fib with RVR Possibly precipitated by acute illness/SBO. Rate initially in the 160s and was started on IV Cardizem bolus and infusion.  However, rate continued to be uncontrolled despite maximum dose Cardizem infusion and subsequently given IV metoprolol.  Rate now improved to the 80s but continues to be in A. Fib.  Blood pressure soft.  Potassium and magnesium levels normal. -Cardiology consulted -Cardiac monitoring -Cardiology recommending continuing IV Cardizem drip (can go up to 20 mg/h).  Add IV metoprolol 5 mg every 4-6 hours scheduled if heart rate is still higher than 120s or PRN.  If still uncontrolled then consider IV digoxin 500 mcg and then 6 hours later give IV digoxin 250 mcg x 2 (6 hours interval each dose). -CHA2DS2-VASc 2 based on age (no clear history of hypertension and not on meds).  Patient is currently going to the OR on an urgent basis.  Cardiology recommending starting  Eliquis 5 mg twice daily for anticoagulation when safe from surgical standpoint or prior to discharge. -IV fluids and monitor blood pressure -Check TSH level -Echocardiogram ordered -Keep potassium above 4 and magnesium above 2  SBO CT showing findings consistent with mechanical SBO with transition point related to right inguinal hernia; no perforation.  Patient is not complaining of abdominal pain. -General surgery consulted and plan for emergent repair -Keep n.p.o. -NG tube in place -Antiemetic as needed  Leukocytosis Possibly reactive.  No fever.  No evidence of pneumonia or intra-abdominal infection on imaging. -Check UA -Repeat CBC in a.m.  Hyperlipidemia -Resume statin when no longer n.p.o.  Ascending thoracic aortic aneurysm CT showing aneurysmal dilatation of the ascending aorta up to 4.8 cm. -Radiologist recommending semiannual imaging follow-up by CTA or MRA -Will need cardiothoracic surgery follow-up  DVT prophylaxis: SCDs Code Status: Patient wishes to be full code. Family Communication: No family available at this time. Disposition Plan: Status is: Inpatient  Remains inpatient appropriate because:Inpatient level of care appropriate due to severity of illness  Dispo: The patient is from: Home              Anticipated d/c is to: Home              Patient currently is not medically stable to d/c.   Difficult to place patient No  Level of care: Level of care: Progressive  The medical decision making on this patient was of high complexity and the patient is at high risk for clinical deterioration, therefore this is a level 3 visit.  Shela Leff MD [facility]  If 7PM-7AM, please contact night-coverage [email or web address]  05/02/2021, 10:00 PM

## 2021-05-02 NOTE — H&P (Signed)
Reason for Consult/Chief Complaint: incarcerated inguinal hernia Consultant: Darl Householder, MD  Gary Rice is an 87 y.o. male.   HPI: 35M with two day history of R groin pain, nausea, vomiting presented to ED. CT scan notable for SBO with transition point at the level of the R inguinal hernia. Known BIH, RIH for the last 2 years that is usually reducible and causes him less discomfort than the [facility].   No past medical history on file.  Past Surgical History:  Procedure Laterality Date   APPENDECTOMY     age 40   CATARACT EXTRACTION, BILATERAL     HERNIA REPAIR     RIH   JOINT REPLACEMENT Right    RTHA   TONSILLECTOMY      Family History  Problem Relation Age of Onset   Heart disease Father    Heart attack Father    Heart attack Son    Sleep apnea Son     Social History:  reports that he quit smoking about 70 years ago. His smoking use included cigarettes. He has never used smokeless tobacco. He reports that he does not drink alcohol and does not use drugs.  Allergies: No Known Allergies  Medications: I have reviewed the patient's current medications.  Results for orders placed or performed during the hospital encounter of 05/02/21 (from the past 48 hour(s))  CBC with Differential/Platelet     Status: Abnormal   Collection Time: 05/02/21  6:06 PM  Result Value Ref Range   WBC 18.0 (H) 4.0 - 10.5 K/uL   RBC 5.31 4.22 - 5.81 MIL/uL   Hemoglobin 16.8 13.0 - 17.0 g/dL   HCT 49.2 39.0 - 52.0 %   MCV 92.7 80.0 - 100.0 fL   MCH 31.6 26.0 - 34.0 pg   MCHC 34.1 30.0 - 36.0 g/dL   RDW 12.9 11.5 - 15.5 %   Platelets 226 150 - 400 K/uL   nRBC 0.0 0.0 - 0.2 %   Neutrophils Relative % 77 %   Neutro Abs 13.7 (H) 1.7 - 7.7 K/uL   Lymphocytes Relative 12 %   Lymphs Abs 2.2 0.7 - 4.0 K/uL   Monocytes Relative 11 %   Monocytes Absolute 2.0 (H) 0.1 - 1.0 K/uL   Eosinophils Relative 0 %   Eosinophils Absolute 0.0 0.0 - 0.5 K/uL   Basophils Relative 0 %   Basophils Absolute 0.0  0.0 - 0.1 K/uL   Immature Granulocytes 0 %   Abs Immature Granulocytes 0.06 0.00 - 0.07 K/uL    Comment: Performed at [facility], [address], [place], [place] [address]  Comprehensive metabolic panel     Status: Abnormal   Collection Time: 05/02/21  6:06 PM  Result Value Ref Range   Sodium 132 (L) 135 - 145 mmol/L   Potassium 4.1 3.5 - 5.1 mmol/L   Chloride 96 (L) 98 - 111 mmol/L   CO2 24 22 - 32 mmol/L   Glucose, Bld 124 (H) 70 - 99 mg/dL    Comment: Glucose reference range applies only to samples taken after fasting for at least 8 hours.   BUN 21 8 - 23 mg/dL   Creatinine, Ser 1.03 0.61 - 1.24 mg/dL   Calcium 10.3 8.9 - 10.3 mg/dL   Total Protein 8.0 6.5 - 8.1 g/dL   Albumin 4.1 3.5 - 5.0 g/dL   AST 31 15 - 41 U/L   ALT 20 0 - 44 U/L   Alkaline Phosphatase 63  38 - 126 U/L   Total Bilirubin 1.3 (H) 0.3 - 1.2 mg/dL   GFR, Estimated >60 >60 mL/min    Comment: (NOTE) Calculated using the CKD-EPI Creatinine Equation (2021)    Anion gap 12 5 - 15    Comment: Performed at [facility] [address] [place], [place] [address]  Troponin I (High Sensitivity)     Status: None   Collection Time: 05/02/21  6:06 PM  Result Value Ref Range   Troponin I (High Sensitivity) 17 <18 ng/L    Comment: (NOTE) Elevated high sensitivity troponin I (hsTnI) values and significant  changes across serial measurements may suggest ACS but many other  chronic and acute conditions are known to elevate hsTnI results.  Refer to the "Links" section for chest pain algorithms and additional  guidance. Performed at [facility], [place] [address] [place], [place] [address]   Magnesium     Status: None   Collection Time: 05/02/21  6:06 PM  Result Value Ref Range   Magnesium 2.1 1.7 - 2.4 mg/dL    Comment: Performed at [facility], [place] [address], [place], [place] [address]  Lipase, blood     Status: None   Collection Time: 05/02/21  6:06 PM  Result Value Ref Range   Lipase  27 11 - 51 U/L    Comment: Performed at [facility] [address] [place], [place] [address]  I-stat chem 8, ED (not at [facility] or [facility])     Status: Abnormal   Collection Time: 05/02/21  6:14 PM  Result Value Ref Range   Sodium 134 (L) 135 - 145 mmol/L   Potassium 4.1 3.5 - 5.1 mmol/L   Chloride 98 98 - 111 mmol/L   BUN 22 8 - 23 mg/dL   Creatinine, Ser 1.00 0.61 - 1.24 mg/dL   Glucose, Bld 128 (H) 70 - 99 mg/dL    Comment: Glucose reference range applies only to samples taken after fasting for at least 8 hours.   Calcium, Ion 1.14 (L) 1.15 - 1.40 mmol/L   TCO2 26 22 - 32 mmol/L   Hemoglobin 17.0 13.0 - 17.0 g/dL   HCT 50.0 39.0 - 52.0 %  Troponin I (High Sensitivity)     Status: Abnormal   Collection Time: 05/02/21  8:07 PM  Result Value Ref Range   Troponin I (High Sensitivity) 20 (H) <18 ng/L    Comment: (NOTE) Elevated high sensitivity troponin I (hsTnI) values and significant  changes across serial measurements may suggest ACS but many other  chronic and acute conditions are known to elevate hsTnI results.  Refer to the "Links" section for chest pain algorithms and additional  guidance. Performed at [facility], [place] [address], [place], [place] [address]     CT Angio Chest PE W and/or Wo Contrast  Result Date: 05/02/2021 CLINICAL DATA:  Blockage unable to tolerate p.o. EXAM: CT ANGIOGRAPHY CHEST CT ABDOMEN AND PELVIS WITH CONTRAST TECHNIQUE: Multidetector CT imaging of the chest was performed using the standard protocol during bolus administration of intravenous contrast. Multiplanar CT image reconstructions and MIPs were obtained to evaluate the vascular anatomy. Multidetector CT imaging of the abdomen and pelvis was performed using the standard protocol during bolus administration of intravenous contrast. CONTRAST:  164m OMNIPAQUE IOHEXOL 350 MG/ML SOLN COMPARISON:  Radiography 05/02/2021 FINDINGS: CTA CHEST FINDINGS Cardiovascular: Satisfactory opacification of  the pulmonary arteries to the segmental level. No evidence of pulmonary embolism.  Mild aortic atherosclerosis. Aneurysmal dilatation of the ascending aorta up to 4.8 cm. Normal cardiac size. Mild coronary vascular calcification. No pericardial effusion Mediastinum/Nodes: No enlarged mediastinal, hilar, or axillary lymph nodes. Thyroid gland, trachea, and esophagus demonstrate no significant findings. Lungs/Pleura: Lungs are clear. No pleural effusion or pneumothorax. Musculoskeletal: No chest wall abnormality. No acute or significant osseous findings. Review of the MIP images confirms the above findings. CT ABDOMEN and PELVIS FINDINGS Hepatobiliary: Hyperdense sludge or excreted contrast within the gallbladder. No biliary dilatation. Subcentimeter hypodensities in the liver too small to further characterize. Small cyst in the left hepatic lobe. Pancreas: Unremarkable. No pancreatic ductal dilatation or surrounding inflammatory changes. Spleen: Normal in size without focal abnormality. Adrenals/Urinary Tract: Adrenal glands are normal. Bilateral renal cysts. 9.4 cm cyst in the upper pole left kidney. The bladder is unremarkable Stomach/Bowel: Fluid distension of the stomach, proximal and mid small bowel consistent with obstruction. Transition point related to right inguinal hernia containing fat and short segment of small bowel. Small bowel distal to the hernia is decompressed. No acute bowel wall thickening. Vascular/Lymphatic: Moderate aortic atherosclerosis. No aneurysm. No suspicious nodes Reproductive: Enlarged prostate Other: Moderate left inguinal hernia containing fat and decompressed small bowel. Musculoskeletal: Orthopedic hardware right hip with artifact. No acute osseous abnormality Review of the MIP images confirms the above findings. IMPRESSION: 1. Negative for acute pulmonary embolus.  Clear lung fields. 2. Findings consistent with mechanical small bowel obstruction, transition point related to right  inguinal hernia. No perforation. 3. Moderate fat and small bowel containing left inguinal hernia without adverse features 4. Aneurysmal dilatation of the ascending aorta up to 4.8 cm. Ascending thoracic aortic aneurysm. Recommend semi-annual imaging followup by CTA or MRA and referral to cardiothoracic surgery if not already obtained. This recommendation follows 2010 ACCF/AHA/AATS/ACR/ASA/SCA/SCAI/SIR/STS/SVM Guidelines for the Diagnosis and Management of Patients With Thoracic Aortic Disease. Circulation. 2010; [id number]. Aortic aneurysm NOS (ICD10-I71.9) Electronically Signed   By: Donavan Foil M.D.   On: 05/02/2021 19:56   CT ABDOMEN PELVIS W CONTRAST  Result Date: 05/02/2021 CLINICAL DATA:  Blockage unable to tolerate p.o. EXAM: CT ANGIOGRAPHY CHEST CT ABDOMEN AND PELVIS WITH CONTRAST TECHNIQUE: Multidetector CT imaging of the chest was performed using the standard protocol during bolus administration of intravenous contrast. Multiplanar CT image reconstructions and MIPs were obtained to evaluate the vascular anatomy. Multidetector CT imaging of the abdomen and pelvis was performed using the standard protocol during bolus administration of intravenous contrast. CONTRAST:  135m OMNIPAQUE IOHEXOL 350 MG/ML SOLN COMPARISON:  Radiography 05/02/2021 FINDINGS: CTA CHEST FINDINGS Cardiovascular: Satisfactory opacification of the pulmonary arteries to the segmental level. No evidence of pulmonary embolism. Mild aortic atherosclerosis. Aneurysmal dilatation of the ascending aorta up to 4.8 cm. Normal cardiac size. Mild coronary vascular calcification. No pericardial effusion Mediastinum/Nodes: No enlarged mediastinal, hilar, or axillary lymph nodes. Thyroid gland, trachea, and esophagus demonstrate no significant findings. Lungs/Pleura: Lungs are clear. No pleural effusion or pneumothorax. Musculoskeletal: No chest wall abnormality. No acute or significant osseous findings. Review of the MIP images confirms the  above findings. CT ABDOMEN and PELVIS FINDINGS Hepatobiliary: Hyperdense sludge or excreted contrast within the gallbladder. No biliary dilatation. Subcentimeter hypodensities in the liver too small to further characterize. Small cyst in the left hepatic lobe. Pancreas: Unremarkable. No pancreatic ductal dilatation or surrounding inflammatory changes. Spleen: Normal in size without focal abnormality. Adrenals/Urinary Tract: Adrenal glands are normal. Bilateral renal cysts. 9.4 cm cyst in the upper pole left kidney. The bladder is unremarkable Stomach/Bowel: Fluid  distension of the stomach, proximal and mid small bowel consistent with obstruction. Transition point related to right inguinal hernia containing fat and short segment of small bowel. Small bowel distal to the hernia is decompressed. No acute bowel wall thickening. Vascular/Lymphatic: Moderate aortic atherosclerosis. No aneurysm. No suspicious nodes Reproductive: Enlarged prostate Other: Moderate left inguinal hernia containing fat and decompressed small bowel. Musculoskeletal: Orthopedic hardware right hip with artifact. No acute osseous abnormality Review of the MIP images confirms the above findings. IMPRESSION: 1. Negative for acute pulmonary embolus.  Clear lung fields. 2. Findings consistent with mechanical small bowel obstruction, transition point related to right inguinal hernia. No perforation. 3. Moderate fat and small bowel containing left inguinal hernia without adverse features 4. Aneurysmal dilatation of the ascending aorta up to 4.8 cm. Ascending thoracic aortic aneurysm. Recommend semi-annual imaging followup by CTA or MRA and referral to cardiothoracic surgery if not already obtained. This recommendation follows 2010 ACCF/AHA/AATS/ACR/ASA/SCA/SCAI/SIR/STS/SVM Guidelines for the Diagnosis and Management of Patients With Thoracic Aortic Disease. Circulation. 2010; [id number]. Aortic aneurysm NOS (ICD10-I71.9) Electronically Signed   By:  Donavan Foil M.D.   On: 05/02/2021 19:56   DG Chest Port 1 View  Result Date: 05/02/2021 CLINICAL DATA:  Shortness of breath EXAM: PORTABLE CHEST 1 VIEW COMPARISON:  08/04/2007 FINDINGS: Heart is normal size. Tortuous, ectatic thoracic aorta. Lungs clear. No effusions. No acute bony abnormality. IMPRESSION: No acute cardiopulmonary disease. Electronically Signed   By: Rolm Baptise M.D.   On: 05/02/2021 18:25   DG Abd Portable 1 View  Result Date: 05/02/2021 CLINICAL DATA:  Shortness of breath, abdominal pain EXAM: PORTABLE ABDOMEN - 1 VIEW COMPARISON:  None. FINDINGS: Nonspecific bowel gas pattern. Gas throughout nondistended large and small bowel. No evidence of bowel obstruction. No organomegaly, suspicious calcification or free air. Visualized lung bases clear. IMPRESSION: No evidence of bowel obstruction or free air. Electronically Signed   By: Rolm Baptise M.D.   On: 05/02/2021 18:26    ROS 10 point review of systems is negative except as listed above in HPI.   Physical Exam Blood pressure 97/74, pulse 87, temperature 99.2 F (37.3 C), temperature source Oral, resp. rate 13, height '5\' 11"'$  (1.803 m), weight 77.1 kg, SpO2 96 %. Constitutional: well-developed, well-nourished HEENT: pupils equal, round, reactive to light, 63m b/l, moist conjunctiva, external inspection of ears and nose normal, hearing intact Oropharynx: normal oropharyngeal mucosa, poor dentition Neck: no thyromegaly, trachea midline, no midline cervical tenderness to palpation Chest: breath sounds equal bilaterally, normal respiratory effort, no midline or lateral chest wall tenderness to palpation/deformity Abdomen: soft, NT, no bruising, no hepatosplenomegaly GU: no blood at urethral meatus of penis, no scrotal masses, BIH-LIH soft and compressible, RIH firm, non-reducible Back: no wounds, no thoracic/lumbar spine tenderness to palpation, no thoracic/lumbar spine stepoffs Rectal: deferred Extremities: 2+ radial and pedal  pulses bilaterally, motor and sensation intact to bilateral UE and LE, no peripheral edema MSK: unable to assess gait/station, no clubbing/cyanosis of fingers/toes, normal ROM of all four extremities Skin: warm, dry, no rashes Psych: normal memory, normal mood/affect    Assessment/Plan: 88M with incarcerated right inguinal hernia.   Plan for emergent repair. Discussed possibility of bowel compromise and need for possible bowel resection. Discussed increased risk of recurrence due to emergent procedure. Discussed further increased risk of recurrence if mesh unable to be placed due to bowel compromise. Detailed explanation of additional risks including bleeding, infection, hematoma/seroma, decreased fertility, temporary or permanent neuropathy, and mesh infection requiring explantation. Discussed increased  risk of cardiopulmonary and neurologic complications, especially in light of new-onset AF-RVR, initially refractory, now rate-controlled. Discussed risk of bowel compromise and sepsis if cardiac workup is pursued. Discussed that AF-RVR may be a response to impending or current bowel compromise. Discussed code status and patient elects fro full code. Discussed decision-maker in case of incapacitation or inability to extubate post-operatively and patient elects for his wife to be Marine scientist. Offered option for second or back-up decision-maker and patient declines. All questions answered to the patient's satisfaction. Informed consent obtained.    Jesusita Oka, MD General and [facility]

## 2021-05-02 NOTE — ED Notes (Signed)
Patient transported to CT 

## 2021-05-02 NOTE — Anesthesia Procedure Notes (Addendum)
Procedure Name: Intubation Date/Time: 05/02/2021 10:59 PM Performed by: Clovis Cao, CRNA Pre-anesthesia Checklist: Patient identified, Emergency Drugs available, Suction available and Patient being monitored Patient Re-evaluated:Patient Re-evaluated prior to induction Oxygen Delivery Method: Circle system utilized Preoxygenation: Pre-oxygenation with 100% oxygen Induction Type: IV induction, Rapid sequence and Cricoid Pressure applied Laryngoscope Size: Miller and 2 Grade View: Grade I Tube type: Oral Tube size: 7.5 mm Number of attempts: 1 Airway Equipment and Method: Stylet and Oral airway Placement Confirmation: ETT inserted through vocal cords under direct vision, positive ETCO2 and breath sounds checked- equal and bilateral Secured at: 23 cm Tube secured with: Tape Dental Injury: Teeth and Oropharynx as per pre-operative assessment

## 2021-05-02 NOTE — ED Triage Notes (Signed)
Pt here with reports of a "blockage" that is keeping him from being able to tolerate anything PO. Pt also complains of R inguinal hernia. Pt found to have HR 160's in triage.

## 2021-05-02 NOTE — Anesthesia Preprocedure Evaluation (Addendum)
Anesthesia Evaluation  Patient identified by MRN, date of birth, ID band Patient awake    Reviewed: Allergy & Precautions, H&P , NPO status , Patient's Chart, lab work & pertinent test results  Airway Mallampati: III  TM Distance: >3 FB Neck ROM: Full    Dental no notable dental hx. (+) Teeth Intact, Dental Advisory Given   Pulmonary neg pulmonary ROS, former smoker,    Pulmonary exam normal breath sounds clear to auscultation       Cardiovascular + dysrhythmias Atrial Fibrillation  Rhythm:Irregular Rate:Tachycardia     Neuro/Psych negative neurological ROS  negative psych ROS   GI/Hepatic negative GI ROS, Neg liver ROS,   Endo/Other  negative endocrine ROS  Renal/GU negative Renal ROS  negative genitourinary   Musculoskeletal   Abdominal   Peds  Hematology negative hematology ROS (+)   Anesthesia Other Findings   Reproductive/Obstetrics negative OB ROS                            Anesthesia Physical Anesthesia Plan  ASA: 2 and emergent  Anesthesia Plan: General   Post-op Pain Management:    Induction: Intravenous, Rapid sequence and Cricoid pressure planned  PONV Risk Score and Plan: 3 and Ondansetron, Dexamethasone and Treatment may vary due to age or medical condition  Airway Management Planned: Oral ETT  Additional Equipment:   Intra-op Plan:   Post-operative Plan: Extubation in OR  Informed Consent: I have reviewed the patients History and Physical, chart, labs and discussed the procedure including the risks, benefits and alternatives for the proposed anesthesia with the patient or authorized representative who has indicated his/her understanding and acceptance.     Dental advisory given  Plan Discussed with: CRNA  Anesthesia Plan Comments:         Anesthesia Quick Evaluation

## 2021-05-02 NOTE — Consult Note (Signed)
Cardiology Consultation:   Patient ID: [facility] MRN: [id number]; DOB: 05/15/1931  Admit date: 05/02/2021 Date of Consult: 05/02/2021  PCP:  Josetta Huddle, MD   [facility] Providers Cardiologist:  None        Patient Profile:   Gary Rice is a 87 y.o. male with a hx of SVT, h/o hernia repair, appendectomy, prior h/o SBOwho is being seen 05/02/2021 for the evaluation of afib at the request of Dr Marlowe Sax.  History of Present Illness:   Gary Rice is a 87 y.o. male with a hx of SVT, h/o hernia repair, appendectomy, prior h/o SBOwho is being seen 05/02/2021 for the evaluation of afib at the request of Dr Marlowe Sax.  The patient is relatively healthy- no prior h/o CAD, MI, CVA, blood clots/bleeding issues.  He reports having hernia problem for a long time but since last 2 days- progressive worsenign abdominal pain and the hernia in the right groin was  non reducible. His abd pain was associated with n, v, unable to take po and decrease BM. He denies any chest pain, sob, orthopnea, pnd, LE edema, palpitations, dizziness.  ER work up: Triage showed HR 160s, EKG showed Afib. He got IVF 1ltr, diltiazem bolus plus drip, CTA as below (mechanical SBO, transition point related to right inguinal hernia)  At the time of my eval- he is feeling better, has NJ tube in. HR is 90 on diltiazem gtt '10mg'$ /hr. He is getting ready to go to the  OR.    No past medical history on file.  Past Surgical History:  Procedure Laterality Date   APPENDECTOMY     age 58   CATARACT EXTRACTION, BILATERAL     HERNIA REPAIR     RIH   JOINT REPLACEMENT Right    RTHA   TONSILLECTOMY       Home Medications:  Prior to Admission medications   Medication Sig Start Date End Date Taking? Authorizing Provider  aspirin EC 81 MG tablet Take 81 mg daily by mouth.    [provider]  atorvastatin (LIPITOR) 10 MG tablet Take 10 mg by mouth daily. 08/14/15   [provider]  diltiazem  (CARDIZEM) 30 MG tablet TAKE 1 TABLET AS NEEDED FOR RAPID HEART BEAT 10/06/17   Isaiah Serge, NP  fluticasone [facility]) 50 MCG/ACT nasal spray Place 1 spray into both nostrils daily as needed for allergies.  08/14/15   [provider]  Multiple Vitamin (MULTIVITAMIN WITH MINERALS) TABS tablet Take 1 tablet by mouth daily.    [provider]  Multiple Vitamins-Minerals (PRESERVISION AREDS 2) CAPS Take 1 capsule 2 (two) times daily by mouth.    [provider]  VITAMIN E PO Take 1 capsule by mouth daily.     [provider]    Inpatient Medications: Scheduled Meds:  Continuous Infusions:  diltiazem (CARDIZEM) infusion 15 mg/hr (05/02/21 2012)   PRN Meds:   Allergies:   No Known Allergies  Social History:   Social History   Socioeconomic History   Marital status: Married    Spouse name: Not on file   Number of children: Not on file   Years of education: Not on file   Highest education level: Not on file  Occupational History   Not on file  Tobacco Use   Smoking status: Former    Types: Cigarettes    Quit date: 10/24/1950    Years since quitting: 70.5   Smokeless tobacco: Never  Vaping Use  Vaping Use: Never used  Substance and Sexual Activity   Alcohol use: No   Drug use: No   Sexual activity: Not on file  Other Topics Concern   Not on file  Social History Narrative   Not on file   Social Determinants of Health   Financial Resource Strain: Not on file  Food Insecurity: Not on file  Transportation Needs: Not on file  Physical Activity: Not on file  Stress: Not on file  Social Connections: Not on file  Intimate Partner Violence: Not on file    Family History:   none Family History  Problem Relation Age of Onset   Heart disease Father    Heart attack Father    Heart attack Son    Sleep apnea Son      ROS:  Please see the history of present illness.  As noted above All other ROS reviewed and negative.     Physical  Exam/Data:   Vitals:   05/02/21 2015 05/02/21 2030 05/02/21 2045 05/02/21 2100  BP: 109/85 97/73 114/88 104/70  Pulse: (!) 147 (!) 147 (!) 147 96  Resp: (!) '25 19 15 17  '$ Temp:      TempSrc:      SpO2: 94% 93% 94% 95%  Weight:      Height:        Intake/Output Summary (Last 24 hours) at 05/02/2021 2116 Last data filed at 05/02/2021 1918 Gross per 24 hour  Intake 1000 ml  Output --  Net 1000 ml   Last 3 Weights 05/02/2021 10/18/2018 10/08/2017  Weight (lbs) 170 lb 174 lb 3.2 oz 179 lb  Weight (kg) 77.111 kg 79.017 kg 81.194 kg     Body mass index is 23.71 kg/m.  General:  Well nourished, well developed, in no acute distress, NG tube in place HEENT: normal Lymph: no adenopathy Neck: no JVD Endocrine:  No thryomegaly Vascular: No carotid bruits; FA pulses 2+ bilaterally without bruits  Cardiac:  normal S1, S2; irregularly irregular, no murmurs Lungs:  clear to auscultation bilaterally, no wheezing, rhonchi or rales  Abd: soft, mildlytender, decreased Bowel sounds Right inguinal hernia Ext: no edema Musculoskeletal:  No deformities, BUE and BLE strength normal and equal Skin: warm and dry  Neuro:  CNs 2-12 intact, no focal abnormalities noted Psych:  Normal affect    Laboratory Data:  High Sensitivity Troponin:   Recent Labs  Lab 05/02/21 1806 05/02/21 2007  TROPONINIHS 17 20*     Chemistry Recent Labs  Lab 05/02/21 1806 05/02/21 1814  NA 132* 134*  K 4.1 4.1  CL 96* 98  CO2 24  --   GLUCOSE 124* 128*  BUN 21 22  CREATININE 1.03 1.00  CALCIUM 10.3  --   GFRNONAA >60  --   ANIONGAP 12  --     Recent Labs  Lab 05/02/21 1806  PROT 8.0  ALBUMIN 4.1  AST 31  ALT 20  ALKPHOS 63  BILITOT 1.3*   Hematology Recent Labs  Lab 05/02/21 1806 05/02/21 1814  WBC 18.0*  --   RBC 5.31  --   HGB 16.8 17.0  HCT 49.2 50.0  MCV 92.7  --   MCH 31.6  --   MCHC 34.1  --   RDW 12.9  --   PLT 226  --    BNPNo results for input(s): BNP, PROBNP in the last 168  hours.  DDimer No results for input(s): DDIMER in the last 168 hours.   Radiology/Studies:  CT Angio Chest PE W and/or Wo Contrast  Result Date: 05/02/2021 CLINICAL DATA:  Blockage unable to tolerate p.o. EXAM: CT ANGIOGRAPHY CHEST CT ABDOMEN AND PELVIS WITH CONTRAST TECHNIQUE: Multidetector CT imaging of the chest was performed using the standard protocol during bolus administration of intravenous contrast. Multiplanar CT image reconstructions and MIPs were obtained to evaluate the vascular anatomy. Multidetector CT imaging of the abdomen and pelvis was performed using the standard protocol during bolus administration of intravenous contrast. CONTRAST:  156m OMNIPAQUE IOHEXOL 350 MG/ML SOLN COMPARISON:  Radiography 05/02/2021 FINDINGS: CTA CHEST FINDINGS Cardiovascular: Satisfactory opacification of the pulmonary arteries to the segmental level. No evidence of pulmonary embolism. Mild aortic atherosclerosis. Aneurysmal dilatation of the ascending aorta up to 4.8 cm. Normal cardiac size. Mild coronary vascular calcification. No pericardial effusion Mediastinum/Nodes: No enlarged mediastinal, hilar, or axillary lymph nodes. Thyroid gland, trachea, and esophagus demonstrate no significant findings. Lungs/Pleura: Lungs are clear. No pleural effusion or pneumothorax. Musculoskeletal: No chest wall abnormality. No acute or significant osseous findings. Review of the MIP images confirms the above findings. CT ABDOMEN and PELVIS FINDINGS Hepatobiliary: Hyperdense sludge or excreted contrast within the gallbladder. No biliary dilatation. Subcentimeter hypodensities in the liver too small to further characterize. Small cyst in the left hepatic lobe. Pancreas: Unremarkable. No pancreatic ductal dilatation or surrounding inflammatory changes. Spleen: Normal in size without focal abnormality. Adrenals/Urinary Tract: Adrenal glands are normal. Bilateral renal cysts. 9.4 cm cyst in the upper pole left kidney. The bladder  is unremarkable Stomach/Bowel: Fluid distension of the stomach, proximal and mid small bowel consistent with obstruction. Transition point related to right inguinal hernia containing fat and short segment of small bowel. Small bowel distal to the hernia is decompressed. No acute bowel wall thickening. Vascular/Lymphatic: Moderate aortic atherosclerosis. No aneurysm. No suspicious nodes Reproductive: Enlarged prostate Other: Moderate left inguinal hernia containing fat and decompressed small bowel. Musculoskeletal: Orthopedic hardware right hip with artifact. No acute osseous abnormality Review of the MIP images confirms the above findings. IMPRESSION: 1. Negative for acute pulmonary embolus.  Clear lung fields. 2. Findings consistent with mechanical small bowel obstruction, transition point related to right inguinal hernia. No perforation. 3. Moderate fat and small bowel containing left inguinal hernia without adverse features 4. Aneurysmal dilatation of the ascending aorta up to 4.8 cm. Ascending thoracic aortic aneurysm. Recommend semi-annual imaging followup by CTA or MRA and referral to cardiothoracic surgery if not already obtained. This recommendation follows 2010 ACCF/AHA/AATS/ACR/ASA/SCA/SCAI/SIR/STS/SVM Guidelines for the Diagnosis and Management of Patients With Thoracic Aortic Disease. Circulation. 2010; [id number] Aortic aneurysm NOS (ICD10-I71.9) Electronically Signed   By: KDonavan FoilM.D.   On: 05/02/2021 19:56   CT ABDOMEN PELVIS W CONTRAST  Result Date: 05/02/2021 CLINICAL DATA:  Blockage unable to tolerate p.o. EXAM: CT ANGIOGRAPHY CHEST CT ABDOMEN AND PELVIS WITH CONTRAST TECHNIQUE: Multidetector CT imaging of the chest was performed using the standard protocol during bolus administration of intravenous contrast. Multiplanar CT image reconstructions and MIPs were obtained to evaluate the vascular anatomy. Multidetector CT imaging of the abdomen and pelvis was performed using the standard  protocol during bolus administration of intravenous contrast. CONTRAST:  1078mOMNIPAQUE IOHEXOL 350 MG/ML SOLN COMPARISON:  Radiography 05/02/2021 FINDINGS: CTA CHEST FINDINGS Cardiovascular: Satisfactory opacification of the pulmonary arteries to the segmental level. No evidence of pulmonary embolism. Mild aortic atherosclerosis. Aneurysmal dilatation of the ascending aorta up to 4.8 cm. Normal cardiac size. Mild coronary vascular calcification. No pericardial effusion Mediastinum/Nodes: No enlarged mediastinal, hilar, or axillary lymph  nodes. Thyroid gland, trachea, and esophagus demonstrate no significant findings. Lungs/Pleura: Lungs are clear. No pleural effusion or pneumothorax. Musculoskeletal: No chest wall abnormality. No acute or significant osseous findings. Review of the MIP images confirms the above findings. CT ABDOMEN and PELVIS FINDINGS Hepatobiliary: Hyperdense sludge or excreted contrast within the gallbladder. No biliary dilatation. Subcentimeter hypodensities in the liver too small to further characterize. Small cyst in the left hepatic lobe. Pancreas: Unremarkable. No pancreatic ductal dilatation or surrounding inflammatory changes. Spleen: Normal in size without focal abnormality. Adrenals/Urinary Tract: Adrenal glands are normal. Bilateral renal cysts. 9.4 cm cyst in the upper pole left kidney. The bladder is unremarkable Stomach/Bowel: Fluid distension of the stomach, proximal and mid small bowel consistent with obstruction. Transition point related to right inguinal hernia containing fat and short segment of small bowel. Small bowel distal to the hernia is decompressed. No acute bowel wall thickening. Vascular/Lymphatic: Moderate aortic atherosclerosis. No aneurysm. No suspicious nodes Reproductive: Enlarged prostate Other: Moderate left inguinal hernia containing fat and decompressed small bowel. Musculoskeletal: Orthopedic hardware right hip with artifact. No acute osseous abnormality  Review of the MIP images confirms the above findings. IMPRESSION: 1. Negative for acute pulmonary embolus.  Clear lung fields. 2. Findings consistent with mechanical small bowel obstruction, transition point related to right inguinal hernia. No perforation. 3. Moderate fat and small bowel containing left inguinal hernia without adverse features 4. Aneurysmal dilatation of the ascending aorta up to 4.8 cm. Ascending thoracic aortic aneurysm. Recommend semi-annual imaging followup by CTA or MRA and referral to cardiothoracic surgery if not already obtained. This recommendation follows 2010 ACCF/AHA/AATS/ACR/ASA/SCA/SCAI/SIR/STS/SVM Guidelines for the Diagnosis and Management of Patients With Thoracic Aortic Disease. Circulation. 2010; [id number]. Aortic aneurysm NOS (ICD10-I71.9) Electronically Signed   By: Donavan Foil M.D.   On: 05/02/2021 19:56   DG Chest Port 1 View  Result Date: 05/02/2021 CLINICAL DATA:  Shortness of breath EXAM: PORTABLE CHEST 1 VIEW COMPARISON:  08/04/2007 FINDINGS: Heart is normal size. Tortuous, ectatic thoracic aorta. Lungs clear. No effusions. No acute bony abnormality. IMPRESSION: No acute cardiopulmonary disease. Electronically Signed   By: Rolm Baptise M.D.   On: 05/02/2021 18:25   DG Abd Portable 1 View  Result Date: 05/02/2021 CLINICAL DATA:  Shortness of breath, abdominal pain EXAM: PORTABLE ABDOMEN - 1 VIEW COMPARISON:  None. FINDINGS: Nonspecific bowel gas pattern. Gas throughout nondistended large and small bowel. No evidence of bowel obstruction. No organomegaly, suspicious calcification or free air. Visualized lung bases clear. IMPRESSION: No evidence of bowel obstruction or free air. Electronically Signed   By: Rolm Baptise M.D.   On: 05/02/2021 18:26     EKG:  The EKG was personally reviewed and demonstrates:  afib with RVR Telemetry:  Telemetry was personally reviewed and demonstrates:  afib  Relevant CV Studies: Prior ECHO; 2018 - Left ventricle: The  cavity size was normal. There was moderate    focal basal hypertrophy of the septum. Systolic function was    normal. The estimated ejection fraction was in the range of 55%    to 60%. Wall motion was normal; there were no regional wall    motion abnormalities. Doppler parameters are consistent with    abnormal left ventricular relaxation (grade 1 diastolic    dysfunction).  - Aortic valve: There was mild regurgitation.  - Mitral valve: Calcified annulus. Mildly thickened leaflets .    There was mild regurgitation.   CT chest/abd/pelvis: today IMPRESSION: 1. Negative for acute pulmonary embolus.  Clear lung  fields. 2. Findings consistent with mechanical small bowel obstruction, transition point related to right inguinal hernia. No perforation. 3. Moderate fat and small bowel containing left inguinal hernia without adverse features 4. Aneurysmal dilatation of the ascending aorta up to 4.8 cm. Ascending thoracic aortic aneurysm. Recommend semi-annual imaging followup by CTA or MRA and referral to cardiothoracic surgery if not already obtained. This recommendation follows 2010 ACCF/AHA/AATS/ACR/ASA/SCA/SCAI/SIR/STS/SVM Guidelines for the Diagnosis and Management of Patients With Thoracic Aortic Disease. Circulation. 2010; [id number]. Aortic aneurysm NOS (ICD10-I71.9)  Assessment and Plan:   Paroxysmal atrial fibrillation with RVR (unclear onset) H/o SVT SBO Ascending aortic dilation/aneurysm 4.8cm H/o b/l inguinal hernia  Plan:  - continue IV diltiazem gtt (can go upto '20mg'$ /hr). Add IV metoprolol '5mg'$  Q4-6 hours scheduled if HR is still higher than 120s or PRN. If still uncontrolled then consider IV digoxin 534mg followed by in 6 hours give IV digoxin 2552m x2 (6 hours interval each doses).  - NG tube and decompression of SBO. F/w Gen Sx recs - Keep K>4.0, Mg>2.0 - His chadsvasc is 2 (no clear h/o HTN or on meds, so only age counts 2),   - he is going to the OR on urgent  basis.  -> Post OR and when safe from surgical stand point OR prior to discharge-> Anticoagulation with Eliquis '5mg'$  BID should be considered and rate control with toprol xl '25mg'$  daily. -risk stratification- patient RCRI 0, and does not have active chest pain/heart failure or cardiac symptoms. His Afib can be rate controlled and he is at low risk of MACE.   - obtain ECHO when possible during the weekend (last ECHO 2018 was normal EF 60%)  Risk Assessment/Risk Scores:          CHA2DS2-VASc Score = 2  This indicates a 2.2% annual risk of stroke. The patient's score is based upon: CHF History: No HTN History: No Diabetes History: No Stroke History: No Vascular Disease History: No Age Score: 2 Gender Score: 0        For questions or updates, please contact [facility] consult [email or web address] for contact info under    Signed, RoRenae FickleMD  05/02/2021 9:16 PM

## 2021-05-02 NOTE — ED Provider Notes (Signed)
[facility] EMERGENCY DEPARTMENT Provider Note   CSN: [id number] Arrival date & time: 05/02/21  1644     History Chief Complaint  Patient presents with   Hernia   Tachycardia    Gary Rice is a 87 y.o. male hx of appendectomy, previous hernia repair, hypertension here presenting with concern for possible SBO.  Patient states that he has bilateral inguinal hernias.  He states that since yesterday the right one popped and he was unable to get it back in.  He has been vomiting since yesterday.  He was sent by his doctor for possible small bowel obstruction.  He was noted to be tachycardic with a heart rate of 160 in triage.  Denies any chest pain or shortness of breath.  He states that he does not have a known history of A. fib.  Patient is not currently on anticoagulation.  The history is provided by the patient.      No past medical history on file.  There are no problems to display for this patient.   Past Surgical History:  Procedure Laterality Date   APPENDECTOMY     age 3   CATARACT EXTRACTION, BILATERAL     HERNIA REPAIR     RIH   JOINT REPLACEMENT Right    RTHA   TONSILLECTOMY         Family History  Problem Relation Age of Onset   Heart disease Father    Heart attack Father    Heart attack Son    Sleep apnea Son     Social History   Tobacco Use   Smoking status: Former    Types: Cigarettes    Quit date: 10/24/1950    Years since quitting: 70.5   Smokeless tobacco: Never  Vaping Use   Vaping Use: Never used  Substance Use Topics   Alcohol use: No   Drug use: No    Home Medications Prior to Admission medications   Medication Sig Start Date End Date Taking? Authorizing Provider  aspirin EC 81 MG tablet Take 81 mg daily by mouth.    [provider]  atorvastatin (LIPITOR) 10 MG tablet Take 10 mg by mouth daily. 08/14/15   [provider]  diltiazem (CARDIZEM) 30 MG tablet TAKE 1 TABLET AS NEEDED FOR RAPID  HEART BEAT 10/06/17   Isaiah Serge, NP  fluticasone [facility]) 50 MCG/ACT nasal spray Place 1 spray into both nostrils daily as needed for allergies.  08/14/15   [provider]  Multiple Vitamin (MULTIVITAMIN WITH MINERALS) TABS tablet Take 1 tablet by mouth daily.    [provider]  Multiple Vitamins-Minerals (PRESERVISION AREDS 2) CAPS Take 1 capsule 2 (two) times daily by mouth.    [provider]  VITAMIN E PO Take 1 capsule by mouth daily.     [provider]    Allergies    Patient has no known allergies.  Review of Systems   Review of Systems  Gastrointestinal:  Positive for abdominal distention, abdominal pain and vomiting.  All other systems reviewed and are negative.  Physical Exam Updated Vital Signs BP (!) 118/103   Pulse (!) 142   Temp 99.2 F (37.3 C) (Oral)   Resp 16   Ht '5\' 11"'$  (1.803 m)   Wt 77.1 kg   SpO2 91%   BMI 23.71 kg/m   Physical Exam Vitals and nursing note reviewed.  Constitutional:      Appearance: Normal appearance.  Comments: Slightly uncomfortable  HENT:     Head: Normocephalic.     Nose: Nose normal.     Mouth/Throat:     Mouth: Mucous membranes are moist.  Eyes:     Extraocular Movements: Extraocular movements intact.     Pupils: Pupils are equal, round, and reactive to light.  Cardiovascular:     Rate and Rhythm: Tachycardia present. Rhythm irregular.     Pulses: Normal pulses.  Pulmonary:     Effort: Pulmonary effort is normal.     Breath sounds: Normal breath sounds.  Abdominal:     General: Abdomen is flat.     Palpations: Abdomen is soft.     Comments: Right inguinal hernia that is not reducible.  Testicles nontender.  Musculoskeletal:        General: Normal range of motion.     Cervical back: Normal range of motion and neck supple.  Skin:    General: Skin is warm.     Capillary Refill: Capillary refill takes less than 2 seconds.  Neurological:     General: No focal deficit  present.     Mental Status: He is oriented to person, place, and time.  Psychiatric:        Mood and Affect: Mood normal.        Behavior: Behavior normal.    ED Results / Procedures / Treatments   Labs (all labs ordered are listed, but only abnormal results are displayed) Labs Reviewed  CBC WITH DIFFERENTIAL/PLATELET - Abnormal; Notable for the following components:      Result Value   WBC 18.0 (*)    Neutro Abs 13.7 (*)    Monocytes Absolute 2.0 (*)    All other components within normal limits  COMPREHENSIVE METABOLIC PANEL - Abnormal; Notable for the following components:   Sodium 132 (*)    Chloride 96 (*)    Glucose, Bld 124 (*)    Total Bilirubin 1.3 (*)    All other components within normal limits  I-STAT CHEM 8, ED - Abnormal; Notable for the following components:   Sodium 134 (*)    Glucose, Bld 128 (*)    Calcium, Ion 1.14 (*)    All other components within normal limits  MAGNESIUM  LIPASE, BLOOD  TROPONIN I (HIGH SENSITIVITY)  TROPONIN I (HIGH SENSITIVITY)    EKG EKG Interpretation  Date/Time:  Friday May 02 2021 17:51:01 EDT Ventricular Rate:  163 PR Interval:    QRS Duration: 66 QT Interval:  268 QTC Calculation: 441 R Axis:   36 Text Interpretation: Atrial fibrillation with rapid ventricular response ST & T wave abnormality, consider inferior ischemia Abnormal ECG afib new since previous Confirmed by Wandra Arthurs [id number]) on 05/02/2021 6:09:48 PM  Radiology DG Chest Port 1 View  Result Date: 05/02/2021 CLINICAL DATA:  Shortness of breath EXAM: PORTABLE CHEST 1 VIEW COMPARISON:  08/04/2007 FINDINGS: Heart is normal size. Tortuous, ectatic thoracic aorta. Lungs clear. No effusions. No acute bony abnormality. IMPRESSION: No acute cardiopulmonary disease. Electronically Signed   By: Rolm Baptise M.D.   On: 05/02/2021 18:25   DG Abd Portable 1 View  Result Date: 05/02/2021 CLINICAL DATA:  Shortness of breath, abdominal pain EXAM: PORTABLE ABDOMEN - 1 VIEW  COMPARISON:  None. FINDINGS: Nonspecific bowel gas pattern. Gas throughout nondistended large and small bowel. No evidence of bowel obstruction. No organomegaly, suspicious calcification or free air. Visualized lung bases clear. IMPRESSION: No evidence of bowel obstruction or free air. Electronically Signed  By: Rolm Baptise M.D.   On: 05/02/2021 18:26    Procedures Procedures   CRITICAL CARE Performed by: Wandra Arthurs   Total critical care time: 30 minutes  Critical care time was exclusive of separately billable procedures and treating other patients.  Critical care was necessary to treat or prevent imminent or life-threatening deterioration.  Critical care was time spent personally by me on the following activities: development of treatment plan with patient and/or surrogate as well as nursing, discussions with consultants, evaluation of patient's response to treatment, examination of patient, obtaining history from patient or surrogate, ordering and performing treatments and interventions, ordering and review of laboratory studies, ordering and review of radiographic studies, pulse oximetry and re-evaluation of patient's condition.   Medications Ordered in ED Medications  diltiazem (CARDIZEM) 125 mg in dextrose 5% 125 mL (1 mg/mL) infusion (10 mg/hr Intravenous Rate/Dose Change 05/02/21 1918)  sodium chloride 0.9 % bolus 1,000 mL (0 mLs Intravenous Stopped 05/02/21 1918)  morphine 4 MG/ML injection 4 mg (4 mg Intravenous Given 05/02/21 1817)  diltiazem (CARDIZEM) injection 10 mg (10 mg Intravenous Given 05/02/21 1811)  iohexol (OMNIPAQUE) 350 MG/ML injection 100 mL (100 mLs Intravenous Contrast Given 05/02/21 1930)    ED Course  I have reviewed the triage vital signs and the nursing notes.  Pertinent labs & imaging results that were available during my care of the patient were reviewed by me and considered in my medical decision making (see chart for details).    MDM Rules/Calculators/A&P                           Jomo Bogdanowicz is a 87 y.o. male here presenting with abdominal pain and vomiting and reducible inguinal hernia.  I attempted to reduce the hernia after pain management but was unsuccessful.  Consider possible incarcerated hernia.  Patient also has new onset A. fib. Patient's CHADVASC score is 3 so will need anticoagulation.  We will get CTA chest to rule out PE and will get CT abdomen pelvis to rule out as well versus incarcerated hernia.  Will give Cardizem as well.  9:03 PM HR still 140s on max dose of cardizem drip. Consulted Dr. Toney Rakes from cardiology. He recommend IV lopressor and possible dig loading. CT showed R inguinal hernia with SBO. Consulted Dr. Bobbye Morton with recommend NG tube and she will see patient as consult. Hospitalist to admit for afib with RVR, incarcerated hernia   Final Clinical Impression(s) / ED Diagnoses Final diagnoses:  None    Rx / DC Orders ED Discharge Orders     None        Drenda Freeze, MD 05/02/21 2105

## 2021-05-03 ENCOUNTER — Inpatient Hospital Stay (HOSPITAL_COMMUNITY): Payer: Medicare Other

## 2021-05-03 DIAGNOSIS — I712 Thoracic aortic aneurysm, without rupture: Secondary | ICD-10-CM

## 2021-05-03 DIAGNOSIS — I4891 Unspecified atrial fibrillation: Secondary | ICD-10-CM | POA: Diagnosis not present

## 2021-05-03 LAB — BASIC METABOLIC PANEL
Anion gap: 9 (ref 5–15)
BUN: 18 mg/dL (ref 8–23)
CO2: 27 mmol/L (ref 22–32)
Calcium: 8.8 mg/dL — ABNORMAL LOW (ref 8.9–10.3)
Chloride: 99 mmol/L (ref 98–111)
Creatinine, Ser: 1.09 mg/dL (ref 0.61–1.24)
GFR, Estimated: >60 mL/min (ref >60)
Glucose, Bld: 126 mg/dL — ABNORMAL HIGH (ref 70–99)
Potassium: 3.7 mmol/L (ref 3.5–5.1)
Sodium: 135 mmol/L (ref 135–145)

## 2021-05-03 LAB — CBC
HCT: 38.6 % — ABNORMAL LOW (ref 39.0–52.0)
Hemoglobin: 13 g/dL (ref 13.0–17.0)
MCH: 31.7 pg (ref 26.0–34.0)
MCHC: 33.7 g/dL (ref 30.0–36.0)
MCV: 94.1 fL (ref 80.0–100.0)
Platelets: 179 10*3/uL (ref 150–400)
RBC: 4.1 MIL/uL — ABNORMAL LOW (ref 4.22–5.81)
RDW: 13 % (ref 11.5–15.5)
WBC: 12.3 10*3/uL — ABNORMAL HIGH (ref 4.0–10.5)
nRBC: 0 % (ref 0.0–0.2)

## 2021-05-03 LAB — URINALYSIS, COMPLETE (UACMP) WITH MICROSCOPIC
Bacteria, UA: NONE SEEN
Bilirubin Urine: NEGATIVE
Glucose, UA: NEGATIVE mg/dL
Ketones, ur: 5 mg/dL — AB
Leukocytes,Ua: NEGATIVE
Nitrite: NEGATIVE
Protein, ur: NEGATIVE mg/dL
Specific Gravity, Urine: >1.046 — ABNORMAL HIGH (ref 1.005–1.030)
pH: 5 (ref 5.0–8.0)

## 2021-05-03 LAB — TSH: TSH: 2.342 u[IU]/mL (ref 0.350–4.500)

## 2021-05-03 MED ORDER — SUGAMMADEX SODIUM 200 MG/2ML IV SOLN
INTRAVENOUS | Status: DC | PRN
  Administered 2021-05-03: 200 mg via INTRAVENOUS

## 2021-05-03 MED ORDER — ONDANSETRON HCL 4 MG/2ML IJ SOLN
4.0000 mg | Freq: Four times a day (QID) | INTRAMUSCULAR | Status: DC | PRN
  Administered 2021-05-08: 4 mg via INTRAVENOUS
  Filled 2021-05-03 (×2): qty 2

## 2021-05-03 MED ORDER — KETOROLAC TROMETHAMINE 15 MG/ML IJ SOLN
15.0000 mg | Freq: Four times a day (QID) | INTRAMUSCULAR | Status: AC
  Administered 2021-05-03 – 2021-05-07 (×15): 15 mg via INTRAVENOUS
  Filled 2021-05-03 (×13): qty 1

## 2021-05-03 MED ORDER — ENOXAPARIN SODIUM 40 MG/0.4ML IJ SOSY
40.0000 mg | PREFILLED_SYRINGE | INTRAMUSCULAR | Status: DC
  Administered 2021-05-03 – 2021-05-18 (×15): 40 mg via SUBCUTANEOUS
  Filled 2021-05-03 (×15): qty 0.4

## 2021-05-03 MED ORDER — ACETAMINOPHEN 325 MG PO TABS
650.0000 mg | ORAL_TABLET | Freq: Four times a day (QID) | ORAL | Status: DC | PRN
  Filled 2021-05-03: qty 2

## 2021-05-03 MED ORDER — SODIUM CHLORIDE 0.9 % IV SOLN: INTRAVENOUS | Status: AC

## 2021-05-03 MED ORDER — METOPROLOL SUCCINATE ER 25 MG PO TB24: 25.0000 mg | ORAL_TABLET | Freq: Every day | ORAL | Status: DC

## 2021-05-03 MED ORDER — PHENOL 1.4 % MT LIQD
1.0000 | OROMUCOSAL | Status: DC | PRN
  Administered 2021-05-03: 1 via OROMUCOSAL
  Filled 2021-05-03: qty 177

## 2021-05-03 MED ORDER — METHOCARBAMOL 500 MG PO TABS
1000.0000 mg | ORAL_TABLET | Freq: Three times a day (TID) | ORAL | Status: DC
  Administered 2021-05-03 – 2021-05-04 (×4): 1000 mg via ORAL
  Filled 2021-05-03 (×4): qty 2

## 2021-05-03 MED ORDER — METOPROLOL TARTRATE 5 MG/5ML IV SOLN
5.0000 mg | INTRAVENOUS | Status: DC | PRN
  Administered 2021-05-03: 5 mg via INTRAVENOUS
  Filled 2021-05-03 (×2): qty 5

## 2021-05-03 MED ORDER — OXYCODONE HCL 5 MG PO TABS
2.5000 mg | ORAL_TABLET | ORAL | Status: DC | PRN
  Administered 2021-05-03 – 2021-05-04 (×2): 5 mg via ORAL
  Filled 2021-05-03 (×2): qty 1

## 2021-05-03 MED ORDER — ACETAMINOPHEN 500 MG PO TABS
1000.0000 mg | ORAL_TABLET | Freq: Four times a day (QID) | ORAL | Status: DC
  Administered 2021-05-03 – 2021-05-04 (×3): 1000 mg via ORAL
  Filled 2021-05-03 (×3): qty 2

## 2021-05-03 MED ORDER — OXYCODONE HCL 5 MG/5ML PO SOLN: 2.5000 mg | ORAL | Status: DC | PRN

## 2021-05-03 MED ORDER — PANTOPRAZOLE SODIUM 40 MG PO TBEC
40.0000 mg | DELAYED_RELEASE_TABLET | Freq: Two times a day (BID) | ORAL | Status: DC
  Administered 2021-05-03 (×2): 40 mg via ORAL
  Filled 2021-05-03 (×2): qty 1

## 2021-05-03 MED ORDER — SODIUM CHLORIDE 0.9 % IV SOLN
6.2500 mg | Freq: Four times a day (QID) | INTRAVENOUS | Status: DC | PRN
  Administered 2021-05-03: 6.25 mg via INTRAVENOUS
  Filled 2021-05-03 (×4): qty 0.25

## 2021-05-03 MED ORDER — LACTATED RINGERS IV SOLN
INTRAVENOUS | Status: DC
  Administered 2021-05-07: 70 mL via INTRAVENOUS

## 2021-05-03 MED ORDER — MORPHINE SULFATE (PF) 2 MG/ML IV SOLN
1.0000 mg | INTRAVENOUS | Status: DC | PRN
  Administered 2021-05-09 – 2021-05-12 (×7): 2 mg via INTRAVENOUS
  Filled 2021-05-03 (×8): qty 1

## 2021-05-03 MED ORDER — ACETAMINOPHEN 650 MG RE SUPP
650.0000 mg | Freq: Four times a day (QID) | RECTAL | Status: DC | PRN
  Filled 2021-05-03: qty 1

## 2021-05-03 NOTE — Progress Notes (Signed)
Upon arrival to PACU, patient in and out of atrial fibrillation in the 70's.  All VSS, patient in no distress, A&O x 4.  Cardizem gtt turned off in OR and remains off in PACU.  NG tube external length 73cm.  Will get port abdominal x-ray to confirm placement.  Will continue to monitor.

## 2021-05-03 NOTE — Progress Notes (Signed)
Echo attempted. Patient heart rate too high (140-145 bpm). Will attempt again later.

## 2021-05-03 NOTE — Op Note (Signed)
   Operative Note   Date: 05/03/2021  Procedure: open right inguinal hernia repair with mesh  Pre-op diagnosis: incarcerated right inguinal hernia Post-op diagnosis: incarcerated direct right inguinal hernia  Indication and clinical history: The patient is a 87 y.o. year old male with an incarcerated right inguinal hernia     Surgeon: Jesusita Oka, MD  Anesthesiologist: Jefm Petty, MD Anesthesia: General  Findings:  Specimen: none EBL: <10cc Drains/Implants: 3x6in ultrapro mesh  Disposition: PACU - hemodynamically stable.  Description of procedure: The patient was positioned supine on the operating room table. General anesthetic induction and intubation were uneventful. Time-out was performed verifying correct patient, procedure, laterality, signature of informed consent, and administration of pre-operative antibiotics. The abdomen and right groin were prepped and draped in the usual sterile fashion.  An incision was made approximately 2/3 distance from the ASIS to the pubic tubercle and deepened through Scarpa's fascia until the external oblique aponeurosis was reached. The external oblique aponeurosis was entered. The spermatic cord was isolated and encircled with a Penrose drain. A direct hernia was clearly identified and dusky incarcerated bowel was visualized. The neck of the hernia defect was opened and the bowel appeared to pink up. The bowel was then able to be reduced into the peritoneal cavity. A 3x6 mesh was cut to fit and sutured to the pubic tubercle with a zero vicryl suture and run along the shelving edge inferiorly and the conjoined tendon superiorly. A slit was made in the center of the mesh to accommodate the cord structures and a vicryl suture was used to create a snug fit around it. The ends of the mesh were overlapped beneath the external oblique aponeurosis. The external oblique was closed with a 2-0 vicryl suture and Scarpa's closed with a 3-0 vicryl. Local  anesthetic was infiltrated into the surrounding tissues. The skin was closed with 4-0 monocryl suture and dermabond applied as dressing.   Sterile dressings were applied. All sponge and instrument counts were correct at the conclusion of the procedure. The patient was awakened from anesthesia, extubated uneventfully, and transported to the PACU in good condition. There were no complications.    Jesusita Oka, MD General and [facility]

## 2021-05-03 NOTE — Anesthesia Postprocedure Evaluation (Signed)
Anesthesia Post Note  Patient: Gary Rice [facility]  Procedure(s) Performed: REPAIR  INCARCERATED INGUINAL HERNIA  WITH MESH (Right: Inguinal)     Patient location during evaluation: PACU Anesthesia Type: General Level of consciousness: awake and alert Pain management: pain level controlled Vital Signs Assessment: post-procedure vital signs reviewed and stable Respiratory status: spontaneous breathing, nonlabored ventilation, respiratory function stable and patient connected to nasal cannula oxygen Cardiovascular status: blood pressure returned to baseline and stable Postop Assessment: no apparent nausea or vomiting Anesthetic complications: no   No notable events documented.  Last Vitals:  Vitals:   05/03/21 0120 05/03/21 0307  BP: 120/63 132/84  Pulse: 74 74  Resp: 13 18  Temp: 36.7 C   SpO2: 95% 97%    Last Pain:  Vitals:   05/03/21 0140  TempSrc:   PainSc: 0-No pain                 Gary Rice,W. EDMOND

## 2021-05-03 NOTE — Transfer of Care (Signed)
Immediate Anesthesia Transfer of Care Note  Patient: Gary Rice [facility]  Procedure(s) Performed: HERNIA REPAIR INGUINAL INCARCERATED (Right: Inguinal)  Patient Location: PACU  Anesthesia Type:General  Level of Consciousness: drowsy  Airway & Oxygen Therapy: Patient Spontanous Breathing and Patient connected to face mask oxygen  Post-op Assessment: Report given to RN and Post -op Vital signs reviewed and stable  Post vital signs: Reviewed and stable  Last Vitals:  Vitals Value Taken Time  BP    Temp    Pulse 69 05/03/21 0015  Resp    SpO2 94 % 05/03/21 0015  Vitals shown include unvalidated device data.  Last Pain:  Vitals:   05/02/21 1803  TempSrc:   PainSc: 0-No pain         Complications: No notable events documented.

## 2021-05-03 NOTE — Progress Notes (Signed)
Spoke with Dr. Rayann Heman regarding pts HR in 140s, orders received to restart cardizem gtts at '5mg'$ /hr

## 2021-05-03 NOTE — Progress Notes (Signed)
Admitted from PACU to RM 19 accompanied by RN,alert and oriented. Attached to continous cardiac monitoring,CCMD notified.VS checked,oriented to the room and staff.Will continue to closely monitor the patient.

## 2021-05-03 NOTE — Progress Notes (Signed)
SLP Cancellation Note  Patient Details Name: Ziquan Musch MRN: [id number] DOB: 01/26/1931   Cancelled treatment:       Reason Eval/Treat Not Completed: Medical issues which prohibited therapy. Pt presenting with frequent emesis of clear liquids he reports to have recently consumed. RN and MD notified. SLP to f/u for BSE at subsequent date.   [place] [facility], [place], [place] [facility] Office Number: [phone]   Acie Fredrickson 05/03/2021, 1:30 PM

## 2021-05-03 NOTE — Progress Notes (Addendum)
PROGRESS NOTE    Gary Rice DOB: [birth year] DOA: 05/02/2021 PCP: Gary Huddle, MD   Brief Narrative:  Gary Rice is a 87 y.o. male with medical history significant of SVT, hyperlipidemia, appendectomy, bilateral inguinal hernias, previous hernia repair presented to the ED complaining of vomiting.  He reported that his right inguinal hernia had popped and he was sent by his doctor for evaluation of possible bowel obstruction.  In the ED, patient found to be in new onset A. fib with rate in the 160s. CT showing findings consistent with mechanical SBO with transition point related to right inguinal hernia; no perforation.  Moderate fat and small bowel containing left inguinal hernia without adverse features.  ED physician was not able to reduce the inguinal hernia.  General surgery recommended NG tube - given concern for incarcerated RIGHT inguinal hernia patient was taken to OR for repair with mesh overnight 05/03/21.  Assessment & Plan:   Principal Problem:   Atrial fibrillation with rapid ventricular response (HCC) Active Problems:   SBO (small bowel obstruction) (HCC)   Leukocytosis   Hyperlipidemia   Thoracic aortic aneurysm (HCC)   Acute intractable nausea vomiting  -Worsening late this evening on the 6th -we will make n.p.o., insert NG tube given profound emesis this afternoon despite Zofran and Phenergan multiple doses -Message sent to general surgery - abdomen appears benign, unclear if this is related to recent surgery and post op ileus or other factor  New onset A. fib with RVR, likely provoked given below, poorly controlled - Cardiology consulted given poor control with cardizem drip - remains poorly controlled - PRN metoprolol today without improvement - Cardiology recommending continuing IV Cardizem drip (can go up to 20 mg/h).  Add IV metoprolol 5 mg every 4-6 hours scheduled if heart rate is still higher than 120s or PRN.  If still uncontrolled  then consider IV digoxin 500 mcg and then 6 hours later give IV digoxin 250 mcg x 2 (6 hours interval each dose). - CHA2DS2-VASc 2 based on age (no clear history of hypertension and not on meds).  Patient is currently going to the OR on an urgent basis.  Cardiology recommending starting Eliquis 5 mg twice daily for anticoagulation when safe from surgical standpoint or prior to discharge. - IV fluids and monitor blood pressure - TSH WNL - Echocardiogram ordered  SBO with incarcerated R inguinal hernia -Gen Sx following - urgent repair overnight 05/03/21 with mesh -Pain control, diet advancement, and ambulation status per surgery   Leukocytosis, likely reactive given above - WBC downtrending appropriately with supportive care/surgery   Hyperlipidemia -Resume statin when no longer n.p.o.   Ascending thoracic aortic aneurysm CT showing aneurysmal dilatation of the ascending aorta up to 4.8 cm. -Radiologist recommending semiannual imaging follow-up by CTA or MRA -Will need cardiothoracic surgery follow-up outpatient per protocol   DVT prophylaxis: SCDs Code Status: FULL Family Communication: Over the phone  Status is: Inpt  Dispo: The patient is from: Home              Anticipated d/c is to: TBD              Anticipated d/c date is: >72h              Patient currently NOT medically stable for discharge  Consultants:  General Surgery, Cardiology  Procedures:  Hernia/mesh repair 05/03/21  Antimicrobials:  None   Subjective: No acute issues overnight - episodes of cough/vomiting later this afternoon  Objective: Vitals:   05/03/21 0045 05/03/21 0100 05/03/21 0120 05/03/21 0307  BP: 115/75 117/74 120/63 132/84  Pulse: 69 68 74 74  Resp: '11 11 13 18  '$ Temp:  98.1 F (36.7 C) 98 F (36.7 C)   TempSrc:   Oral   SpO2: 98% 96% 95% 97%  Weight:      Height:        Intake/Output Summary (Last 24 hours) at 05/03/2021 0742 Last data filed at 05/03/2021 0326 Gross per 24 hour  Intake  2900 ml  Output 550 ml  Net 2350 ml   Filed Weights   05/02/21 1805  Weight: 77.1 kg    Examination:  General exam: Appears calm and comfortable  Respiratory system: Clear to auscultation. Respiratory effort normal. Cardiovascular system: S1 & S2 heard, RRR. No JVD, murmurs, rubs, gallops or clicks. No pedal edema. Gastrointestinal system: Abdomen is nondistended, soft and nontender. No organomegaly or masses felt. Normal bowel sounds heard. Central nervous system: Alert and oriented. No focal neurological deficits. Extremities: Symmetric 5 x 5 power. Skin: No rashes, lesions or ulcers Psychiatry: Judgement and insight appear normal. Mood & affect appropriate.   Data Reviewed: I have personally reviewed following labs and imaging studies  CBC: Recent Labs  Lab 05/02/21 1806 05/02/21 1814 05/03/21 0150  WBC 18.0*  --  12.3*  NEUTROABS 13.7*  --   --   HGB 16.8 17.0 13.0  HCT 49.2 50.0 38.6*  MCV 92.7  --  94.1  PLT 226  --  0000000   Basic Metabolic Panel: Recent Labs  Lab 05/02/21 1806 05/02/21 1814 05/03/21 0150  NA 132* 134* 135  K 4.1 4.1 3.7  CL 96* 98 99  CO2 24  --  27  GLUCOSE 124* 128* 126*  BUN '21 22 18  '$ CREATININE 1.03 1.00 1.09  CALCIUM 10.3  --  8.8*  MG 2.1  --   --    GFR: Estimated Creatinine Clearance: 49.9 mL/min (by C-G formula based on SCr of 1.09 mg/dL). Liver Function Tests: Recent Labs  Lab 05/02/21 1806  AST 31  ALT 20  ALKPHOS 63  BILITOT 1.3*  PROT 8.0  ALBUMIN 4.1   Recent Labs  Lab 05/02/21 1806  LIPASE 27   No results for input(s): AMMONIA in the last 168 hours. Coagulation Profile: No results for input(s): INR, PROTIME in the last 168 hours. Cardiac Enzymes: No results for input(s): CKTOTAL, CKMB, CKMBINDEX, TROPONINI in the last 168 hours. BNP (last 3 results) No results for input(s): PROBNP in the last 8760 hours. HbA1C: No results for input(s): HGBA1C in the last 72 hours. CBG: No results for input(s): GLUCAP  in the last 168 hours. Lipid Profile: No results for input(s): CHOL, HDL, LDLCALC, TRIG, CHOLHDL, LDLDIRECT in the last 72 hours. Thyroid Function Tests: Recent Labs    05/03/21 0150  TSH 2.342   Anemia Panel: No results for input(s): VITAMINB12, FOLATE, FERRITIN, TIBC, IRON, RETICCTPCT in the last 72 hours. Sepsis Labs: No results for input(s): PROCALCITON, LATICACIDVEN in the last 168 hours.  Recent Results (from the past 240 hour(s))  Resp Panel by RT-PCR (Flu A&B, Covid) Nasopharyngeal Swab     Status: None   Collection Time: 05/02/21  9:40 PM   Specimen: Nasopharyngeal Swab; Nasopharyngeal(NP) swabs in vial transport medium  Result Value Ref Range Status   SARS Coronavirus 2 by RT PCR NEGATIVE NEGATIVE Final    Comment: (NOTE) SARS-CoV-2 target nucleic acids are NOT DETECTED.  The SARS-CoV-2  RNA is generally detectable in upper respiratory specimens during the acute phase of infection. The lowest concentration of SARS-CoV-2 viral copies this assay can detect is 138 copies/mL. A negative result does not preclude SARS-Cov-2 infection and should not be used as the sole basis for treatment or other patient management decisions. A negative result may occur with  improper specimen collection/handling, submission of specimen other than nasopharyngeal swab, presence of viral mutation(s) within the areas targeted by this assay, and inadequate number of viral copies(<138 copies/mL). A negative result must be combined with clinical observations, patient history, and epidemiological information. The expected result is Negative.  Fact Sheet for Patients:  EntrepreneurPulse.com.au  Fact Sheet for Healthcare Providers:  IncredibleEmployment.be  This test is no t yet approved or cleared by the [place] FDA and  has been authorized for detection and/or diagnosis of SARS-CoV-2 by FDA under an Emergency Use Authorization (EUA). This EUA will  remain  in effect (meaning this test can be used) for the duration of the COVID-19 declaration under Section 564(b)(1) of the Act, 21 U.S.C.section 360bbb-3(b)(1), unless the authorization is terminated  or revoked sooner.       Influenza A by PCR NEGATIVE NEGATIVE Final   Influenza B by PCR NEGATIVE NEGATIVE Final    Comment: (NOTE) The Xpert Xpress SARS-CoV-2/FLU/RSV plus assay is intended as an aid in the diagnosis of influenza from Nasopharyngeal swab specimens and should not be used as a sole basis for treatment. Nasal washings and aspirates are unacceptable for Xpert Xpress SARS-CoV-2/FLU/RSV testing.  Fact Sheet for Patients: EntrepreneurPulse.com.au  Fact Sheet for Healthcare Providers: IncredibleEmployment.be  This test is not yet approved or cleared by the [place] FDA and has been authorized for detection and/or diagnosis of SARS-CoV-2 by FDA under an Emergency Use Authorization (EUA). This EUA will remain in effect (meaning this test can be used) for the duration of the COVID-19 declaration under Section 564(b)(1) of the Act, 21 U.S.C. section 360bbb-3(b)(1), unless the authorization is terminated or revoked.  Performed at [facility], [place] [address], [place], [place] [address]          Radiology Studies: CT Angio Chest PE W and/or Wo Contrast  Result Date: 05/02/2021 CLINICAL DATA:  Blockage unable to tolerate p.o. EXAM: CT ANGIOGRAPHY CHEST CT ABDOMEN AND PELVIS WITH CONTRAST TECHNIQUE: Multidetector CT imaging of the chest was performed using the standard protocol during bolus administration of intravenous contrast. Multiplanar CT image reconstructions and MIPs were obtained to evaluate the vascular anatomy. Multidetector CT imaging of the abdomen and pelvis was performed using the standard protocol during bolus administration of intravenous contrast. CONTRAST:  131m OMNIPAQUE IOHEXOL 350 MG/ML SOLN COMPARISON:   Radiography 05/02/2021 FINDINGS: CTA CHEST FINDINGS Cardiovascular: Satisfactory opacification of the pulmonary arteries to the segmental level. No evidence of pulmonary embolism. Mild aortic atherosclerosis. Aneurysmal dilatation of the ascending aorta up to 4.8 cm. Normal cardiac size. Mild coronary vascular calcification. No pericardial effusion Mediastinum/Nodes: No enlarged mediastinal, hilar, or axillary lymph nodes. Thyroid gland, trachea, and esophagus demonstrate no significant findings. Lungs/Pleura: Lungs are clear. No pleural effusion or pneumothorax. Musculoskeletal: No chest wall abnormality. No acute or significant osseous findings. Review of the MIP images confirms the above findings. CT ABDOMEN and PELVIS FINDINGS Hepatobiliary: Hyperdense sludge or excreted contrast within the gallbladder. No biliary dilatation. Subcentimeter hypodensities in the liver too small to further characterize. Small cyst in the left hepatic lobe. Pancreas: Unremarkable. No pancreatic ductal dilatation or surrounding inflammatory changes. Spleen: Normal  in size without focal abnormality. Adrenals/Urinary Tract: Adrenal glands are normal. Bilateral renal cysts. 9.4 cm cyst in the upper pole left kidney. The bladder is unremarkable Stomach/Bowel: Fluid distension of the stomach, proximal and mid small bowel consistent with obstruction. Transition point related to right inguinal hernia containing fat and short segment of small bowel. Small bowel distal to the hernia is decompressed. No acute bowel wall thickening. Vascular/Lymphatic: Moderate aortic atherosclerosis. No aneurysm. No suspicious nodes Reproductive: Enlarged prostate Other: Moderate left inguinal hernia containing fat and decompressed small bowel. Musculoskeletal: Orthopedic hardware right hip with artifact. No acute osseous abnormality Review of the MIP images confirms the above findings. IMPRESSION: 1. Negative for acute pulmonary embolus.  Clear lung fields.  2. Findings consistent with mechanical small bowel obstruction, transition point related to right inguinal hernia. No perforation. 3. Moderate fat and small bowel containing left inguinal hernia without adverse features 4. Aneurysmal dilatation of the ascending aorta up to 4.8 cm. Ascending thoracic aortic aneurysm. Recommend semi-annual imaging followup by CTA or MRA and referral to cardiothoracic surgery if not already obtained. This recommendation follows 2010 ACCF/AHA/AATS/ACR/ASA/SCA/SCAI/SIR/STS/SVM Guidelines for the Diagnosis and Management of Patients With Thoracic Aortic Disease. Circulation. 2010; [id number]:[id number]. Aortic aneurysm NOS (ICD10-I71.9) Electronically Signed   By: Donavan Foil M.D.   On: 05/02/2021 19:56   CT ABDOMEN PELVIS W CONTRAST  Result Date: 05/02/2021 CLINICAL DATA:  Blockage unable to tolerate p.o. EXAM: CT ANGIOGRAPHY CHEST CT ABDOMEN AND PELVIS WITH CONTRAST TECHNIQUE: Multidetector CT imaging of the chest was performed using the standard protocol during bolus administration of intravenous contrast. Multiplanar CT image reconstructions and MIPs were obtained to evaluate the vascular anatomy. Multidetector CT imaging of the abdomen and pelvis was performed using the standard protocol during bolus administration of intravenous contrast. CONTRAST:  177m OMNIPAQUE IOHEXOL 350 MG/ML SOLN COMPARISON:  Radiography 05/02/2021 FINDINGS: CTA CHEST FINDINGS Cardiovascular: Satisfactory opacification of the pulmonary arteries to the segmental level. No evidence of pulmonary embolism. Mild aortic atherosclerosis. Aneurysmal dilatation of the ascending aorta up to 4.8 cm. Normal cardiac size. Mild coronary vascular calcification. No pericardial effusion Mediastinum/Nodes: No enlarged mediastinal, hilar, or axillary lymph nodes. Thyroid gland, trachea, and esophagus demonstrate no significant findings. Lungs/Pleura: Lungs are clear. No pleural effusion or pneumothorax. Musculoskeletal: No chest  wall abnormality. No acute or significant osseous findings. Review of the MIP images confirms the above findings. CT ABDOMEN and PELVIS FINDINGS Hepatobiliary: Hyperdense sludge or excreted contrast within the gallbladder. No biliary dilatation. Subcentimeter hypodensities in the liver too small to further characterize. Small cyst in the left hepatic lobe. Pancreas: Unremarkable. No pancreatic ductal dilatation or surrounding inflammatory changes. Spleen: Normal in size without focal abnormality. Adrenals/Urinary Tract: Adrenal glands are normal. Bilateral renal cysts. 9.4 cm cyst in the upper pole left kidney. The bladder is unremarkable Stomach/Bowel: Fluid distension of the stomach, proximal and mid small bowel consistent with obstruction. Transition point related to right inguinal hernia containing fat and short segment of small bowel. Small bowel distal to the hernia is decompressed. No acute bowel wall thickening. Vascular/Lymphatic: Moderate aortic atherosclerosis. No aneurysm. No suspicious nodes Reproductive: Enlarged prostate Other: Moderate left inguinal hernia containing fat and decompressed small bowel. Musculoskeletal: Orthopedic hardware right hip with artifact. No acute osseous abnormality Review of the MIP images confirms the above findings. IMPRESSION: 1. Negative for acute pulmonary embolus.  Clear lung fields. 2. Findings consistent with mechanical small bowel obstruction, transition point related to right inguinal hernia. No perforation. 3. Moderate fat and small  bowel containing left inguinal hernia without adverse features 4. Aneurysmal dilatation of the ascending aorta up to 4.8 cm. Ascending thoracic aortic aneurysm. Recommend semi-annual imaging followup by CTA or MRA and referral to cardiothoracic surgery if not already obtained. This recommendation follows 2010 ACCF/AHA/AATS/ACR/ASA/SCA/SCAI/SIR/STS/SVM Guidelines for the Diagnosis and Management of Patients With Thoracic Aortic Disease.  Circulation. 2010; [id number]. Aortic aneurysm NOS (ICD10-I71.9) Electronically Signed   By: Donavan Foil M.D.   On: 05/02/2021 19:56   DG Chest Port 1 View  Result Date: 05/02/2021 CLINICAL DATA:  Shortness of breath EXAM: PORTABLE CHEST 1 VIEW COMPARISON:  08/04/2007 FINDINGS: Heart is normal size. Tortuous, ectatic thoracic aorta. Lungs clear. No effusions. No acute bony abnormality. IMPRESSION: No acute cardiopulmonary disease. Electronically Signed   By: Rolm Baptise M.D.   On: 05/02/2021 18:25   DG Abd Portable 1 View  Result Date: 05/03/2021 CLINICAL DATA:  NG tube placement EXAM: PORTABLE ABDOMEN - 1 VIEW COMPARISON:  05/02/2021 FINDINGS: NG tube tip is in the distal esophagus. Dilated small bowel loops again seen in the abdomen. IMPRESSION: NG tube tip in the distal esophagus. Electronically Signed   By: Rolm Baptise M.D.   On: 05/03/2021 00:45   DG Abd Portable 1 View  Result Date: 05/02/2021 CLINICAL DATA:  Shortness of breath, abdominal pain EXAM: PORTABLE ABDOMEN - 1 VIEW COMPARISON:  None. FINDINGS: Nonspecific bowel gas pattern. Gas throughout nondistended large and small bowel. No evidence of bowel obstruction. No organomegaly, suspicious calcification or free air. Visualized lung bases clear. IMPRESSION: No evidence of bowel obstruction or free air. Electronically Signed   By: Rolm Baptise M.D.   On: 05/02/2021 18:26        Scheduled Meds:  acetaminophen  1,000 mg Oral Q6H   ketorolac  15 mg Intravenous Q6H   methocarbamol  1,000 mg Oral Q8H   Continuous Infusions:  sodium chloride 125 mL/hr at 05/03/21 0124   diltiazem (CARDIZEM) infusion Stopped (05/02/21 2252)     LOS: 1 day    Time spent: 36mn   Kylah Maresh C Mekayla Soman, DO [facility]  If 7PM-7AM, please contact night-coverage [email or web address]  05/03/2021, 7:42 AM

## 2021-05-03 NOTE — Progress Notes (Signed)
Progress Note   Subjective   Doing well today, the patient denies CP or SOB.  Recovering from surgery.  No new concerns  Inpatient Medications    Scheduled Meds:  acetaminophen  1,000 mg Oral Q6H   enoxaparin (LOVENOX) injection  40 mg Subcutaneous Q24H   ketorolac  15 mg Intravenous Q6H   methocarbamol  1,000 mg Oral Q8H   pantoprazole  40 mg Oral BID   Continuous Infusions:  sodium chloride 125 mL/hr at 05/03/21 0124   diltiazem (CARDIZEM) infusion Stopped (05/02/21 2252)   PRN Meds: acetaminophen **OR** acetaminophen, metoprolol tartrate, morphine injection, ondansetron (ZOFRAN) IV, oxyCODONE, phenol   Vital Signs    Vitals:   05/03/21 0100 05/03/21 0120 05/03/21 0307 05/03/21 0800  BP: 117/74 120/63 132/84 139/87  Pulse: 68 74 74 82  Resp: '11 13 18 '$ (!) 21  Temp: 98.1 F (36.7 C) 98 F (36.7 C)  97.8 F (36.6 C)  TempSrc:  Oral  Oral  SpO2: 96% 95% 97% 97%  Weight:      Height:        Intake/Output Summary (Last 24 hours) at 05/03/2021 1000 Last data filed at 05/03/2021 [id number] Gross per 24 hour  Intake 2900 ml  Output 850 ml  Net 2050 ml   Filed Weights   05/02/21 1805  Weight: 77.1 kg    Telemetry    Sinus with frequent PACs, nonsustained VT - Personally Reviewed  Physical Exam   GEN- The patient is elderly  appearing, alert and oriented x 3 today.   Head- normocephalic, atraumatic Eyes-  Sclera clear, conjunctiva pink Ears- hearing intact Oropharynx- clear Neck- supple, Lungs-  normal work of breathing Heart- Regular rate and rhythm  GI- soft  Extremities- no clubbing, cyanosis, or edema  MS- no significant deformity or atrophy Skin- no rash or lesion Psych- euthymic mood, full affect Neuro- strength and sensation are intact   Labs    Chemistry Recent Labs  Lab 05/02/21 1806 05/02/21 1814 05/03/21 0150  NA 132* 134* 135  K 4.1 4.1 3.7  CL 96* 98 99  CO2 24  --  27  GLUCOSE 124* 128* 126*  BUN '21 22 18  '$ CREATININE 1.03 1.00  1.09  CALCIUM 10.3  --  8.8*  PROT 8.0  --   --   ALBUMIN 4.1  --   --   AST 31  --   --   ALT 20  --   --   ALKPHOS 63  --   --   BILITOT 1.3*  --   --   GFRNONAA >60  --  >60  ANIONGAP 12  --  9     Hematology Recent Labs  Lab 05/02/21 1806 05/02/21 1814 05/03/21 0150  WBC 18.0*  --  12.3*  RBC 5.31  --  4.10*  HGB 16.8 17.0 13.0  HCT 49.2 50.0 38.6*  MCV 92.7  --  94.1  MCH 31.6  --  31.7  MCHC 34.1  --  33.7  RDW 12.9  --  13.0  PLT 226  --  179     Patient ID  Gary Rice is a 87 y.o. male with a hx of SVT, h/o hernia repair, appendectomy, prior h/o SBOwho is being seen 05/02/2021 for the evaluation of afib at the request of Dr Marlowe Sax.  Assessment & Plan    1.  Afib/ atach Now back in sinus Start toprol '25mg'$  daily  Start eliquis '5mg'$  BID in 2-3 days (  when ok with surgical team) Echo pending  2. Ascending aortic dilation/ aneurysm (4.8cm) Outpatient follow-up with Dr Novella Rob to discharge from cardiology standpoint.  I will arrange AF clinic follow-up as well as follow-up with Dr Marlou Porch.  Thompson Grayer MD, [facility] 05/03/2021 10:00 AM

## 2021-05-03 NOTE — Progress Notes (Signed)
1 Day Post-Op  Subjective: "I'm here"  doesn't feel too much right now.  Seems pain is controlled but hasn't moved around much.  No flatus or BM.  No nausea.  States he doesn't feel bloated.  Only c/o reflux  ROS: See above, otherwise other systems negative  Objective: Vital signs in last 24 hours: Temp:  [97.7 F (36.5 C)-99.2 F (37.3 C)] 97.8 F (36.6 C) (08/06 0800) Pulse Rate:  [47-163] 82 (08/06 0800) Resp:  [10-25] 21 (08/06 0800) BP: (97-139)/(63-103) 139/87 (08/06 0800) SpO2:  [90 %-100 %] 97 % (08/06 0800) Weight:  [77.1 kg] 77.1 kg (08/05 1805)    Intake/Output from previous day: 08/05 0701 - 08/06 0700 In: 2900 [I.V.:1000; IV Piggyback:1900] Out: 550 [Urine:300; Blood:50] Intake/Output this shift: Total I/O In: -  Out: 300 [Urine:300]  PE: Abd: soft, but seems distended in upper abdomen, few BS, R inguinal incision is c/d/I with dermabond  Lab Results:  Recent Labs    05/02/21 1806 05/02/21 1814 05/03/21 0150  WBC 18.0*  --  12.3*  HGB 16.8 17.0 13.0  HCT 49.2 50.0 38.6*  PLT 226  --  179   BMET Recent Labs    05/02/21 1806 05/02/21 1814 05/03/21 0150  NA 132* 134* 135  K 4.1 4.1 3.7  CL 96* 98 99  CO2 24  --  27  GLUCOSE 124* 128* 126*  BUN 21 22 18   CREATININE 1.03 1.00 1.09  CALCIUM 10.3  --  8.8*   PT/INR No results for input(s): LABPROT, INR in the last 72 hours. CMP     Component Value Date/Time   NA 135 05/03/2021 0150   K 3.7 05/03/2021 0150   CL 99 05/03/2021 0150   CO2 27 05/03/2021 0150   GLUCOSE 126 (H) 05/03/2021 0150   BUN 18 05/03/2021 0150   CREATININE 1.09 05/03/2021 0150   CALCIUM 8.8 (L) 05/03/2021 0150   PROT 8.0 05/02/2021 1806   ALBUMIN 4.1 05/02/2021 1806   AST 31 05/02/2021 1806   ALT 20 05/02/2021 1806   ALKPHOS 63 05/02/2021 1806   BILITOT 1.3 (H) 05/02/2021 1806   GFRNONAA >60 05/03/2021 0150   GFRAA  08/14/2007 0312    >60        The eGFR has been calculated using the MDRD equation. This  calculation has not been validated in all clinical   Lipase     Component Value Date/Time   LIPASE 27 05/02/2021 1806       Studies/Results: CT Angio Chest PE W and/or Wo Contrast  Result Date: 05/02/2021 CLINICAL DATA:  Blockage unable to tolerate p.o. EXAM: CT ANGIOGRAPHY CHEST CT ABDOMEN AND PELVIS WITH CONTRAST TECHNIQUE: Multidetector CT imaging of the chest was performed using the standard protocol during bolus administration of intravenous contrast. Multiplanar CT image reconstructions and MIPs were obtained to evaluate the vascular anatomy. Multidetector CT imaging of the abdomen and pelvis was performed using the standard protocol during bolus administration of intravenous contrast. CONTRAST:  145m OMNIPAQUE IOHEXOL 350 MG/ML SOLN COMPARISON:  Radiography 05/02/2021 FINDINGS: CTA CHEST FINDINGS Cardiovascular: Satisfactory opacification of the pulmonary arteries to the segmental level. No evidence of pulmonary embolism. Mild aortic atherosclerosis. Aneurysmal dilatation of the ascending aorta up to 4.8 cm. Normal cardiac size. Mild coronary vascular calcification. No pericardial effusion Mediastinum/Nodes: No enlarged mediastinal, hilar, or axillary lymph nodes. Thyroid gland, trachea, and esophagus demonstrate no significant findings. Lungs/Pleura: Lungs are clear. No pleural effusion or pneumothorax. Musculoskeletal: No chest wall  abnormality. No acute or significant osseous findings. Review of the MIP images confirms the above findings. CT ABDOMEN and PELVIS FINDINGS Hepatobiliary: Hyperdense sludge or excreted contrast within the gallbladder. No biliary dilatation. Subcentimeter hypodensities in the liver too small to further characterize. Small cyst in the left hepatic lobe. Pancreas: Unremarkable. No pancreatic ductal dilatation or surrounding inflammatory changes. Spleen: Normal in size without focal abnormality. Adrenals/Urinary Tract: Adrenal glands are normal. Bilateral renal cysts.  9.4 cm cyst in the upper pole left kidney. The bladder is unremarkable Stomach/Bowel: Fluid distension of the stomach, proximal and mid small bowel consistent with obstruction. Transition point related to right inguinal hernia containing fat and short segment of small bowel. Small bowel distal to the hernia is decompressed. No acute bowel wall thickening. Vascular/Lymphatic: Moderate aortic atherosclerosis. No aneurysm. No suspicious nodes Reproductive: Enlarged prostate Other: Moderate left inguinal hernia containing fat and decompressed small bowel. Musculoskeletal: Orthopedic hardware right hip with artifact. No acute osseous abnormality Review of the MIP images confirms the above findings. IMPRESSION: 1. Negative for acute pulmonary embolus.  Clear lung fields. 2. Findings consistent with mechanical small bowel obstruction, transition point related to right inguinal hernia. No perforation. 3. Moderate fat and small bowel containing left inguinal hernia without adverse features 4. Aneurysmal dilatation of the ascending aorta up to 4.8 cm. Ascending thoracic aortic aneurysm. Recommend semi-annual imaging followup by CTA or MRA and referral to cardiothoracic surgery if not already obtained. This recommendation follows 2010 ACCF/AHA/AATS/ACR/ASA/SCA/SCAI/SIR/STS/SVM Guidelines for the Diagnosis and Management of Patients With Thoracic Aortic Disease. Circulation. 2010; 121: F751-W258. Aortic aneurysm NOS (ICD10-I71.9) Electronically Signed   By: Donavan Foil M.D.   On: 05/02/2021 19:56   CT ABDOMEN PELVIS W CONTRAST  Result Date: 05/02/2021 CLINICAL DATA:  Blockage unable to tolerate p.o. EXAM: CT ANGIOGRAPHY CHEST CT ABDOMEN AND PELVIS WITH CONTRAST TECHNIQUE: Multidetector CT imaging of the chest was performed using the standard protocol during bolus administration of intravenous contrast. Multiplanar CT image reconstructions and MIPs were obtained to evaluate the vascular anatomy. Multidetector CT imaging of  the abdomen and pelvis was performed using the standard protocol during bolus administration of intravenous contrast. CONTRAST:  137m OMNIPAQUE IOHEXOL 350 MG/ML SOLN COMPARISON:  Radiography 05/02/2021 FINDINGS: CTA CHEST FINDINGS Cardiovascular: Satisfactory opacification of the pulmonary arteries to the segmental level. No evidence of pulmonary embolism. Mild aortic atherosclerosis. Aneurysmal dilatation of the ascending aorta up to 4.8 cm. Normal cardiac size. Mild coronary vascular calcification. No pericardial effusion Mediastinum/Nodes: No enlarged mediastinal, hilar, or axillary lymph nodes. Thyroid gland, trachea, and esophagus demonstrate no significant findings. Lungs/Pleura: Lungs are clear. No pleural effusion or pneumothorax. Musculoskeletal: No chest wall abnormality. No acute or significant osseous findings. Review of the MIP images confirms the above findings. CT ABDOMEN and PELVIS FINDINGS Hepatobiliary: Hyperdense sludge or excreted contrast within the gallbladder. No biliary dilatation. Subcentimeter hypodensities in the liver too small to further characterize. Small cyst in the left hepatic lobe. Pancreas: Unremarkable. No pancreatic ductal dilatation or surrounding inflammatory changes. Spleen: Normal in size without focal abnormality. Adrenals/Urinary Tract: Adrenal glands are normal. Bilateral renal cysts. 9.4 cm cyst in the upper pole left kidney. The bladder is unremarkable Stomach/Bowel: Fluid distension of the stomach, proximal and mid small bowel consistent with obstruction. Transition point related to right inguinal hernia containing fat and short segment of small bowel. Small bowel distal to the hernia is decompressed. No acute bowel wall thickening. Vascular/Lymphatic: Moderate aortic atherosclerosis. No aneurysm. No suspicious nodes Reproductive: Enlarged prostate Other: Moderate  left inguinal hernia containing fat and decompressed small bowel. Musculoskeletal: Orthopedic hardware  right hip with artifact. No acute osseous abnormality Review of the MIP images confirms the above findings. IMPRESSION: 1. Negative for acute pulmonary embolus.  Clear lung fields. 2. Findings consistent with mechanical small bowel obstruction, transition point related to right inguinal hernia. No perforation. 3. Moderate fat and small bowel containing left inguinal hernia without adverse features 4. Aneurysmal dilatation of the ascending aorta up to 4.8 cm. Ascending thoracic aortic aneurysm. Recommend semi-annual imaging followup by CTA or MRA and referral to cardiothoracic surgery if not already obtained. This recommendation follows 2010 ACCF/AHA/AATS/ACR/ASA/SCA/SCAI/SIR/STS/SVM Guidelines for the Diagnosis and Management of Patients With Thoracic Aortic Disease. Circulation. 2010; 121: Z834-M219. Aortic aneurysm NOS (ICD10-I71.9) Electronically Signed   By: Donavan Foil M.D.   On: 05/02/2021 19:56   DG Chest Port 1 View  Result Date: 05/02/2021 CLINICAL DATA:  Shortness of breath EXAM: PORTABLE CHEST 1 VIEW COMPARISON:  08/04/2007 FINDINGS: Heart is normal size. Tortuous, ectatic thoracic aorta. Lungs clear. No effusions. No acute bony abnormality. IMPRESSION: No acute cardiopulmonary disease. Electronically Signed   By: Rolm Baptise M.D.   On: 05/02/2021 18:25   DG Abd Portable 1 View  Result Date: 05/03/2021 CLINICAL DATA:  NG tube placement EXAM: PORTABLE ABDOMEN - 1 VIEW COMPARISON:  05/02/2021 FINDINGS: NG tube tip is in the distal esophagus. Dilated small bowel loops again seen in the abdomen. IMPRESSION: NG tube tip in the distal esophagus. Electronically Signed   By: Rolm Baptise M.D.   On: 05/03/2021 00:45   DG Abd Portable 1 View  Result Date: 05/02/2021 CLINICAL DATA:  Shortness of breath, abdominal pain EXAM: PORTABLE ABDOMEN - 1 VIEW COMPARISON:  None. FINDINGS: Nonspecific bowel gas pattern. Gas throughout nondistended large and small bowel. No evidence of bowel obstruction. No  organomegaly, suspicious calcification or free air. Visualized lung bases clear. IMPRESSION: No evidence of bowel obstruction or free air. Electronically Signed   By: Rolm Baptise M.D.   On: 05/02/2021 18:26    Anti-infectives: Anti-infectives (From admission, onward)    None        Assessment/Plan POD 1, s/p open right inguinal hernia repair with mesh by Dr. Bobbye Morton for incarcerated RIH with BO -maintain CLD as patient without bowel function yet.  If begins to vomit, may require decrease in diet -mobilize -pulm toilet/IS -pain control, multi-modal   FEN - CLD/IVFs VTE - Lovenox ID - none  New onset a fib Ascending thoracic aneurysm   LOS: 1 day    Henreitta Cea , [facility] 05/03/2021, 9:38 AM Please see Amion for pager number during day hours 7:00am-4:30pm or 7:00am -11:30am on weekends

## 2021-05-04 ENCOUNTER — Inpatient Hospital Stay (HOSPITAL_COMMUNITY): Payer: Medicare Other

## 2021-05-04 DIAGNOSIS — I712 Thoracic aortic aneurysm, without rupture: Secondary | ICD-10-CM | POA: Diagnosis not present

## 2021-05-04 DIAGNOSIS — I4891 Unspecified atrial fibrillation: Secondary | ICD-10-CM

## 2021-05-04 LAB — ECHOCARDIOGRAM COMPLETE
AR max vel: 3 cm2
AV Area VTI: 3.09 cm2
AV Area mean vel: 3.03 cm2
AV Mean grad: 4 mmHg
AV Peak grad: 7.3 mmHg
Ao pk vel: 1.35 m/s
Height: 71 in
S' Lateral: 2.7 cm
Weight: 2720 oz

## 2021-05-04 LAB — BASIC METABOLIC PANEL
Anion gap: 11 (ref 5–15)
BUN: 27 mg/dL — ABNORMAL HIGH (ref 8–23)
CO2: 29 mmol/L (ref 22–32)
Calcium: 8.8 mg/dL — ABNORMAL LOW (ref 8.9–10.3)
Chloride: 96 mmol/L — ABNORMAL LOW (ref 98–111)
Creatinine, Ser: 1.06 mg/dL (ref 0.61–1.24)
GFR, Estimated: >60 mL/min (ref >60)
Glucose, Bld: 108 mg/dL — ABNORMAL HIGH (ref 70–99)
Potassium: 3.2 mmol/L — ABNORMAL LOW (ref 3.5–5.1)
Sodium: 136 mmol/L (ref 135–145)

## 2021-05-04 LAB — CBC
HCT: 41.1 % (ref 39.0–52.0)
Hemoglobin: 13.9 g/dL (ref 13.0–17.0)
MCH: 31.2 pg (ref 26.0–34.0)
MCHC: 33.8 g/dL (ref 30.0–36.0)
MCV: 92.4 fL (ref 80.0–100.0)
Platelets: 159 10*3/uL (ref 150–400)
RBC: 4.45 MIL/uL (ref 4.22–5.81)
RDW: 13 % (ref 11.5–15.5)
WBC: 6.2 10*3/uL (ref 4.0–10.5)
nRBC: 0 % (ref 0.0–0.2)

## 2021-05-04 MED ORDER — OXYCODONE HCL 5 MG PO TABS
2.5000 mg | ORAL_TABLET | ORAL | Status: DC | PRN
  Administered 2021-05-04 – 2021-05-05 (×3): 5 mg
  Filled 2021-05-04 (×3): qty 1

## 2021-05-04 MED ORDER — POTASSIUM CHLORIDE 10 MEQ/100ML IV SOLN
10.0000 meq | INTRAVENOUS | Status: AC
  Administered 2021-05-04 (×4): 10 meq via INTRAVENOUS
  Filled 2021-05-04 (×4): qty 100

## 2021-05-04 MED ORDER — METHOCARBAMOL 500 MG PO TABS
1000.0000 mg | ORAL_TABLET | Freq: Three times a day (TID) | ORAL | Status: DC
  Administered 2021-05-04 – 2021-05-06 (×5): 1000 mg
  Filled 2021-05-04 (×5): qty 2

## 2021-05-04 MED ORDER — ACETAMINOPHEN 650 MG RE SUPP: 650.0000 mg | Freq: Four times a day (QID) | RECTAL | Status: DC | PRN

## 2021-05-04 MED ORDER — ACETAMINOPHEN 325 MG PO TABS: 650.0000 mg | ORAL_TABLET | Freq: Four times a day (QID) | ORAL | Status: DC | PRN

## 2021-05-04 MED ORDER — METOPROLOL TARTRATE 25 MG/10 ML ORAL SUSPENSION
12.5000 mg | Freq: Two times a day (BID) | ORAL | Status: DC
  Administered 2021-05-04 (×2): 12.5 mg
  Filled 2021-05-04 (×3): qty 5

## 2021-05-04 MED ORDER — ACETAMINOPHEN 160 MG/5ML PO SOLN
1000.0000 mg | Freq: Four times a day (QID) | ORAL | Status: DC
  Administered 2021-05-04 – 2021-05-14 (×17): 1000 mg
  Filled 2021-05-04 (×21): qty 40.6

## 2021-05-04 MED ORDER — PANTOPRAZOLE SODIUM 40 MG PO PACK
40.0000 mg | PACK | Freq: Two times a day (BID) | ORAL | Status: DC
  Administered 2021-05-04 – 2021-05-05 (×3): 40 mg
  Filled 2021-05-04 (×5): qty 20

## 2021-05-04 NOTE — Progress Notes (Addendum)
2 Days Post-Op  Subjective: Vomiting a lot yesterday afternoon.  Had NGT placed and has had 2200cc out since placement.  No flatus or BM  ROS: See above, otherwise other systems negative  Objective: Vital signs in last 24 hours: Temp:  [97.8 F (36.6 C)-98.9 F (37.2 C)] 98.8 F (37.1 C) (08/07 0300) Pulse Rate:  [77-145] 77 (08/07 0300) Resp:  [16-18] 18 (08/07 0300) BP: (95-111)/(58-85) 111/58 (08/07 0300) SpO2:  [94 %-95 %] 94 % (08/07 0300)    Intake/Output from previous day: 08/06 0701 - 08/07 0700 In: 1697.3 [I.V.:1107; IV Piggyback:590.4] Out: 2800 [Urine:600; Emesis/NG output:2200] Intake/Output this shift: No intake/output data recorded.  PE: Abd: softer and less distended today, NGT in place with 800cc of bilious output, hypoactive BS, incision is c/d/i  Lab Results:  Recent Labs    05/03/21 0150 05/04/21 0540  WBC 12.3* 6.2  HGB 13.0 13.9  HCT 38.6* 41.1  PLT 179 159   BMET Recent Labs    05/03/21 0150 05/04/21 0540  NA 135 136  K 3.7 3.2*  CL 99 96*  CO2 27 29  GLUCOSE 126* 108*  BUN 18 27*  CREATININE 1.09 1.06  CALCIUM 8.8* 8.8*   PT/INR No results for input(s): LABPROT, INR in the last 72 hours. CMP     Component Value Date/Time   NA 136 05/04/2021 0540   K 3.2 (L) 05/04/2021 0540   CL 96 (L) 05/04/2021 0540   CO2 29 05/04/2021 0540   GLUCOSE 108 (H) 05/04/2021 0540   BUN 27 (H) 05/04/2021 0540   CREATININE 1.06 05/04/2021 0540   CALCIUM 8.8 (L) 05/04/2021 0540   PROT 8.0 05/02/2021 1806   ALBUMIN 4.1 05/02/2021 1806   AST 31 05/02/2021 1806   ALT 20 05/02/2021 1806   ALKPHOS 63 05/02/2021 1806   BILITOT 1.3 (H) 05/02/2021 1806   GFRNONAA >60 05/04/2021 0540   GFRAA  08/14/2007 0312    >60        The eGFR has been calculated using the MDRD equation. This calculation has not been validated in all clinical   Lipase     Component Value Date/Time   LIPASE 27 05/02/2021 1806       Studies/Results: CT Angio Chest  PE W and/or Wo Contrast  Result Date: 05/02/2021 CLINICAL DATA:  Blockage unable to tolerate p.o. EXAM: CT ANGIOGRAPHY CHEST CT ABDOMEN AND PELVIS WITH CONTRAST TECHNIQUE: Multidetector CT imaging of the chest was performed using the standard protocol during bolus administration of intravenous contrast. Multiplanar CT image reconstructions and MIPs were obtained to evaluate the vascular anatomy. Multidetector CT imaging of the abdomen and pelvis was performed using the standard protocol during bolus administration of intravenous contrast. CONTRAST:  172m OMNIPAQUE IOHEXOL 350 MG/ML SOLN COMPARISON:  Radiography 05/02/2021 FINDINGS: CTA CHEST FINDINGS Cardiovascular: Satisfactory opacification of the pulmonary arteries to the segmental level. No evidence of pulmonary embolism. Mild aortic atherosclerosis. Aneurysmal dilatation of the ascending aorta up to 4.8 cm. Normal cardiac size. Mild coronary vascular calcification. No pericardial effusion Mediastinum/Nodes: No enlarged mediastinal, hilar, or axillary lymph nodes. Thyroid gland, trachea, and esophagus demonstrate no significant findings. Lungs/Pleura: Lungs are clear. No pleural effusion or pneumothorax. Musculoskeletal: No chest wall abnormality. No acute or significant osseous findings. Review of the MIP images confirms the above findings. CT ABDOMEN and PELVIS FINDINGS Hepatobiliary: Hyperdense sludge or excreted contrast within the gallbladder. No biliary dilatation. Subcentimeter hypodensities in the liver too small to further characterize. Small cyst in  the left hepatic lobe. Pancreas: Unremarkable. No pancreatic ductal dilatation or surrounding inflammatory changes. Spleen: Normal in size without focal abnormality. Adrenals/Urinary Tract: Adrenal glands are normal. Bilateral renal cysts. 9.4 cm cyst in the upper pole left kidney. The bladder is unremarkable Stomach/Bowel: Fluid distension of the stomach, proximal and mid small bowel consistent with  obstruction. Transition point related to right inguinal hernia containing fat and short segment of small bowel. Small bowel distal to the hernia is decompressed. No acute bowel wall thickening. Vascular/Lymphatic: Moderate aortic atherosclerosis. No aneurysm. No suspicious nodes Reproductive: Enlarged prostate Other: Moderate left inguinal hernia containing fat and decompressed small bowel. Musculoskeletal: Orthopedic hardware right hip with artifact. No acute osseous abnormality Review of the MIP images confirms the above findings. IMPRESSION: 1. Negative for acute pulmonary embolus.  Clear lung fields. 2. Findings consistent with mechanical small bowel obstruction, transition point related to right inguinal hernia. No perforation. 3. Moderate fat and small bowel containing left inguinal hernia without adverse features 4. Aneurysmal dilatation of the ascending aorta up to 4.8 cm. Ascending thoracic aortic aneurysm. Recommend semi-annual imaging followup by CTA or MRA and referral to cardiothoracic surgery if not already obtained. This recommendation follows 2010 ACCF/AHA/AATS/ACR/ASA/SCA/SCAI/SIR/STS/SVM Guidelines for the Diagnosis and Management of Patients With Thoracic Aortic Disease. Circulation. 2010; 121: B583-E940. Aortic aneurysm NOS (ICD10-I71.9) Electronically Signed   By: Donavan Foil M.D.   On: 05/02/2021 19:56   CT ABDOMEN PELVIS W CONTRAST  Result Date: 05/02/2021 CLINICAL DATA:  Blockage unable to tolerate p.o. EXAM: CT ANGIOGRAPHY CHEST CT ABDOMEN AND PELVIS WITH CONTRAST TECHNIQUE: Multidetector CT imaging of the chest was performed using the standard protocol during bolus administration of intravenous contrast. Multiplanar CT image reconstructions and MIPs were obtained to evaluate the vascular anatomy. Multidetector CT imaging of the abdomen and pelvis was performed using the standard protocol during bolus administration of intravenous contrast. CONTRAST:  128m OMNIPAQUE IOHEXOL 350 MG/ML  SOLN COMPARISON:  Radiography 05/02/2021 FINDINGS: CTA CHEST FINDINGS Cardiovascular: Satisfactory opacification of the pulmonary arteries to the segmental level. No evidence of pulmonary embolism. Mild aortic atherosclerosis. Aneurysmal dilatation of the ascending aorta up to 4.8 cm. Normal cardiac size. Mild coronary vascular calcification. No pericardial effusion Mediastinum/Nodes: No enlarged mediastinal, hilar, or axillary lymph nodes. Thyroid gland, trachea, and esophagus demonstrate no significant findings. Lungs/Pleura: Lungs are clear. No pleural effusion or pneumothorax. Musculoskeletal: No chest wall abnormality. No acute or significant osseous findings. Review of the MIP images confirms the above findings. CT ABDOMEN and PELVIS FINDINGS Hepatobiliary: Hyperdense sludge or excreted contrast within the gallbladder. No biliary dilatation. Subcentimeter hypodensities in the liver too small to further characterize. Small cyst in the left hepatic lobe. Pancreas: Unremarkable. No pancreatic ductal dilatation or surrounding inflammatory changes. Spleen: Normal in size without focal abnormality. Adrenals/Urinary Tract: Adrenal glands are normal. Bilateral renal cysts. 9.4 cm cyst in the upper pole left kidney. The bladder is unremarkable Stomach/Bowel: Fluid distension of the stomach, proximal and mid small bowel consistent with obstruction. Transition point related to right inguinal hernia containing fat and short segment of small bowel. Small bowel distal to the hernia is decompressed. No acute bowel wall thickening. Vascular/Lymphatic: Moderate aortic atherosclerosis. No aneurysm. No suspicious nodes Reproductive: Enlarged prostate Other: Moderate left inguinal hernia containing fat and decompressed small bowel. Musculoskeletal: Orthopedic hardware right hip with artifact. No acute osseous abnormality Review of the MIP images confirms the above findings. IMPRESSION: 1. Negative for acute pulmonary embolus.   Clear lung fields. 2. Findings consistent with mechanical  small bowel obstruction, transition point related to right inguinal hernia. No perforation. 3. Moderate fat and small bowel containing left inguinal hernia without adverse features 4. Aneurysmal dilatation of the ascending aorta up to 4.8 cm. Ascending thoracic aortic aneurysm. Recommend semi-annual imaging followup by CTA or MRA and referral to cardiothoracic surgery if not already obtained. This recommendation follows 2010 ACCF/AHA/AATS/ACR/ASA/SCA/SCAI/SIR/STS/SVM Guidelines for the Diagnosis and Management of Patients With Thoracic Aortic Disease. Circulation. 2010; 121: H476-L465. Aortic aneurysm NOS (ICD10-I71.9) Electronically Signed   By: Donavan Foil M.D.   On: 05/02/2021 19:56   DG CHEST PORT 1 VIEW  Result Date: 05/03/2021 CLINICAL DATA:  NG tube placement EXAM: PORTABLE CHEST 1 VIEW COMPARISON:  05/02/2021 FINDINGS: NG tube tip is in the fundus of the stomach. No confluent airspace opacities. Heart is normal size. No effusions. IMPRESSION: NG tube tip in the fundus of the stomach. No acute cardiopulmonary disease. Electronically Signed   By: Rolm Baptise M.D.   On: 05/03/2021 18:38   DG Chest Port 1 View  Result Date: 05/02/2021 CLINICAL DATA:  Shortness of breath EXAM: PORTABLE CHEST 1 VIEW COMPARISON:  08/04/2007 FINDINGS: Heart is normal size. Tortuous, ectatic thoracic aorta. Lungs clear. No effusions. No acute bony abnormality. IMPRESSION: No acute cardiopulmonary disease. Electronically Signed   By: Rolm Baptise M.D.   On: 05/02/2021 18:25   DG Abd Portable 1 View  Result Date: 05/03/2021 CLINICAL DATA:  NG tube placement EXAM: PORTABLE ABDOMEN - 1 VIEW COMPARISON:  05/02/2021 FINDINGS: NG tube tip is in the distal esophagus. Dilated small bowel loops again seen in the abdomen. IMPRESSION: NG tube tip in the distal esophagus. Electronically Signed   By: Rolm Baptise M.D.   On: 05/03/2021 00:45   DG Abd Portable 1 View  Result  Date: 05/02/2021 CLINICAL DATA:  Shortness of breath, abdominal pain EXAM: PORTABLE ABDOMEN - 1 VIEW COMPARISON:  None. FINDINGS: Nonspecific bowel gas pattern. Gas throughout nondistended large and small bowel. No evidence of bowel obstruction. No organomegaly, suspicious calcification or free air. Visualized lung bases clear. IMPRESSION: No evidence of bowel obstruction or free air. Electronically Signed   By: Rolm Baptise M.D.   On: 05/02/2021 18:26    Anti-infectives: Anti-infectives (From admission, onward)    None        Assessment/Plan POD 2, s/p open right inguinal hernia repair with mesh by Dr. Bobbye Morton for incarcerated RIH with BO -ileus, cont NGT and await bowel function -mobilize as patient states he did not get out of bed at all yesterday -pulm toilet/IS -pain control, multi-modal   FEN - NPO/NGT/IVFs/ replace K VTE - Lovenox ID - none  New onset a fib Ascending thoracic aneurysm   LOS: 2 days    Henreitta Cea , [facility] 05/04/2021, 8:57 AM Please see Amion for pager number during day hours 7:00am-4:30pm or 7:00am -11:30am on weekends

## 2021-05-04 NOTE — Evaluation (Signed)
Occupational Therapy Evaluation Patient Details Name: Gary Rice MRN: [id number] DOB: [birth year] Today's Date: 05/04/2021    History of Present Illness 87yo male admitted 8/522 after being referred to ED by PCP secondary to incarcerated inguinal hernia causing SBO, also found to be in Afib with RVR in the ED. Received surgical repair of incarcerated inguinal hernia 05/02/21. PMH hernia repair joint replacement   Clinical Impression   Pt admitted for concerns listed above. PTA pt reported that he was independent with all ADL's and IADL's, using a single point cane for mobility. At this time, pt has limited activity tolerance and weakness, however was able to complete bed mobility with mod I, OOB mobility with min guard and all ADL's with supervision to min guard. OT will follow acutely.     Follow Up Recommendations  Home health OT;Supervision - Intermittent    Equipment Recommendations  Other (comment) (RW)    Recommendations for Other Services       Precautions / Restrictions Precautions Precautions: Fall;Other (comment) Precaution Comments: s/p inguinal hernia repair, NG tube, watch SPO2 and HR Restrictions Weight Bearing Restrictions: No      Mobility Bed Mobility Overal bed mobility: Modified Independent             General bed mobility comments: Pt completed supine to sit with no assist    Transfers Overall transfer level: Needs assistance Equipment used: Rolling walker (2 wheeled) Transfers: Sit to/from Stand Sit to Stand: Min guard         General transfer comment: Min g, no physical assist,    Balance Overall balance assessment: Needs assistance Sitting-balance support: No upper extremity supported;Feet supported Sitting balance-Leahy Scale: Good     Standing balance support: Bilateral upper extremity supported;During functional activity Standing balance-Leahy Scale: Fair Standing balance comment: no external assist needed, pt able to let go of  walker                           ADL either performed or assessed with clinical judgement   ADL Overall ADL's : Needs assistance/impaired Eating/Feeding: NPO   Grooming: Supervision/safety;Standing   Upper Body Bathing: Supervision/ safety;Sitting   Lower Body Bathing: Min guard;Sit to/from stand   Upper Body Dressing : Supervision/safety;Sitting   Lower Body Dressing: Min guard;Sit to/from stand;Sitting/lateral leans   Toilet Transfer: Min guard;Ambulation   Toileting- Clothing Manipulation and Hygiene: Min guard;Sit to/from stand;Sitting/lateral lean   Tub/ Shower Transfer: Min guard;Ambulation   Functional mobility during ADLs: Min guard;Rolling walker General ADL Comments: Pt doing well, tolerating OOB tasks with no overt concerns     Vision Baseline Vision/History: Wears glasses Wears Glasses: At all times Patient Visual Report: No change from baseline Vision Assessment?: No apparent visual deficits     Perception Perception Perception Tested?: No   Praxis Praxis Praxis tested?: Not tested    Pertinent Vitals/Pain Pain Assessment: No/denies pain     Hand Dominance Right   Extremity/Trunk Assessment Upper Extremity Assessment Upper Extremity Assessment: Overall WFL for tasks assessed   Lower Extremity Assessment Lower Extremity Assessment: Defer to PT evaluation   Cervical / Trunk Assessment Cervical / Trunk Assessment: Normal   Communication Communication Communication: No difficulties   Cognition Arousal/Alertness: Awake/alert Behavior During Therapy: WFL for tasks assessed/performed Overall Cognitive Status: Within Functional Limits for tasks assessed  General Comments  HR controlled 87-98, O2 95% on 3L throughout    Exercises     Shoulder Instructions      Home Living Family/patient expects to be discharged to:: Private residence Living Arrangements: Spouse/significant  other Available Help at Discharge: Family;Available PRN/intermittently Type of Home: House Home Access: Stairs to enter [employer] of Steps: 11 STE to enter thru garage with R ascending rail; back entrance has 4 STE with B rails Entrance Stairs-Rails: Right Home Layout: Two level Alternate Level Stairs-Number of Steps: 17 steps with B rails Alternate Level Stairs-Rails: Can reach both Bathroom Shower/Tub: Walk-in shower;Other (comment)   Bathroom Toilet: Standard Bathroom Accessibility: Yes How Accessible: Accessible via walker Home Equipment: Grab bars - tub/shower;Cane - single point   Additional Comments: one fall recently- fell making a turn into the bathroom and hit hard on R hip but no injuries besides bruising. only uses SPC PRN      Prior Functioning/Environment Level of Independence: Independent with assistive device(s)        Comments: cuts own lawn, still driving, going to grocery store; still managing bills and meds        OT Problem List: Decreased strength;Decreased activity tolerance;Impaired balance (sitting and/or standing);Decreased knowledge of use of DME or AE      OT Treatment/Interventions: Self-care/ADL training;Therapeutic exercise;Energy conservation;DME and/or AE instruction;Therapeutic activities;Balance training;Patient/family education    OT Goals(Current goals can be found in the care plan section) Acute Rehab OT Goals Patient Stated Goal: To go home OT Goal Formulation: With patient Time For Goal Achievement: 05/18/21 Potential to Achieve Goals: Good ADL Goals Pt Will Perform Grooming: with modified independence;standing Pt Will Transfer to Toilet: with modified independence;ambulating Pt Will Perform Toileting - Clothing Manipulation and hygiene: with modified independence;sitting/lateral leans;sit to/from stand Pt/caregiver will Perform Home Exercise Program: Increased strength;Both right and left upper extremity;Independently   OT Frequency: Min 2X/week   Barriers to D/C:            Co-evaluation              AM-PAC OT "6 Clicks" Daily Activity     Outcome Measure Help from another person eating meals?: None (NPO - will be able tofeed himself) Help from another person taking care of personal grooming?: A Little Help from another person toileting, which includes using toliet, bedpan, or urinal?: A Little Help from another person bathing (including washing, rinsing, drying)?: A Little Help from another person to put on and taking off regular upper body clothing?: A Little Help from another person to put on and taking off regular lower body clothing?: A Little 6 Click Score: 19   End of Session Equipment Utilized During Treatment: Rolling walker;Oxygen Nurse Communication: Mobility status  Activity Tolerance: Patient tolerated treatment well Patient left: in chair;with call bell/phone within reach;with chair alarm set  OT Visit Diagnosis: Unsteadiness on feet (R26.81);Other abnormalities of gait and mobility (R26.89);Muscle weakness (generalized) (M62.81)                Time: [id number] OT Time Calculation (min): 20 min Charges:  OT General Charges $OT Visit: 1 Visit OT Evaluation $OT Eval Moderate Complexity: 1 Mod  Phi Avans H., OTR/L Acute Rehabilitation  Leamon Palau Elane Yolanda Bonine 05/04/2021, 5:23 PM

## 2021-05-04 NOTE — Progress Notes (Signed)
SLP Cancellation Note  Patient Details Name: Gary Rice MRN: [id number] DOB: [birth year]   Cancelled treatment:       Reason Eval/Treat Not Completed: Medical issues which prohibited therapy  Patient currently with NG set to low wall suction.  ST will continue efforts to complete swallowing evaluation.   Shelly Flatten, MA, [facility] SLP [phone]  Lamar Sprinkles 05/04/2021, 8:27 AM

## 2021-05-04 NOTE — Progress Notes (Signed)
Progress Note   Subjective   He had nausea/ vomiting yesterday and went into afib.  Now back in sinus.  Unable to take POs.    Inpatient Medications    Scheduled Meds:  acetaminophen (TYLENOL) oral liquid 160 mg/5 mL  1,000 mg Per Tube Q6H   enoxaparin (LOVENOX) injection  40 mg Subcutaneous Q24H   ketorolac  15 mg Intravenous Q6H   methocarbamol  1,000 mg Per Tube Q8H   metoprolol succinate  25 mg Oral Daily   pantoprazole sodium  40 mg Per Tube BID   Continuous Infusions:  diltiazem (CARDIZEM) infusion 10 mg/hr (05/03/21 2234)   lactated ringers 70 mL/hr at 05/03/21 1843   promethazine (PHENERGAN) injection (IM or IVPB) 70 mL/hr at 05/04/21 0230   PRN Meds: acetaminophen **OR** acetaminophen, metoprolol tartrate, morphine injection, ondansetron (ZOFRAN) IV, oxyCODONE, phenol, promethazine (PHENERGAN) injection (IM or IVPB)   Vital Signs    Vitals:   05/03/21 2049 05/03/21 2200 05/03/21 2353 05/04/21 0300  BP: 100/68 103/65 95/73 (!) 111/58  Pulse: (!) 130  96 77  Resp: '18 18 16 18  '$ Temp: 97.8 F (36.6 C)  98.9 F (37.2 C) 98.8 F (37.1 C)  TempSrc: Oral  Oral Oral  SpO2:  94% 94% 94%  Weight:      Height:        Intake/Output Summary (Last 24 hours) at 05/04/2021 [id number] Last data filed at 05/04/2021 0300 Gross per 24 hour  Intake 1697.32 ml  Output 2500 ml  Net -802.68 ml   Filed Weights   05/02/21 1805  Weight: 77.1 kg    Telemetry    Sinus with frequent ectopy this am - Personally Reviewed  Physical Exam   GEN- The patient is elderly appearing, alert and oriented x 3 today.   Head- normocephalic, atraumatic Eyes-  Sclera clear, conjunctiva pink Ears- hearing intact Oropharynx- clear, NG tube in place Neck- supple, Lungs-  normal work of breathing Heart- Regular rate and rhythm  GI- distended  Extremities- no clubbing, cyanosis, or edema  MS- no significant deformity or atrophy Skin- no rash or lesion Psych- euthymic mood, full affect Neuro-  strength and sensation are intact   Labs    Chemistry Recent Labs  Lab 05/02/21 1806 05/02/21 1814 05/03/21 0150 05/04/21 0540  NA 132* 134* 135 136  K 4.1 4.1 3.7 3.2*  CL 96* 98 99 96*  CO2 24  --  27 29  GLUCOSE 124* 128* 126* 108*  BUN '21 22 18 '$ 27*  CREATININE 1.03 1.00 1.09 1.06  CALCIUM 10.3  --  8.8* 8.8*  PROT 8.0  --   --   --   ALBUMIN 4.1  --   --   --   AST 31  --   --   --   ALT 20  --   --   --   ALKPHOS 63  --   --   --   BILITOT 1.3*  --   --   --   GFRNONAA >60  --  >60 >60  ANIONGAP 12  --  9 11     Hematology Recent Labs  Lab 05/02/21 1806 05/02/21 1814 05/03/21 0150 05/04/21 0540  WBC 18.0*  --  12.3* 6.2  RBC 5.31  --  4.10* 4.45  HGB 16.8 17.0 13.0 13.9  HCT 49.2 50.0 38.6* 41.1  MCV 92.7  --  94.1 92.4  MCH 31.6  --  31.7 31.2  MCHC 34.1  --  33.7 33.8  RDW 12.9  --  13.0 13.0  PLT 226  --  179 159     Patient ID  Gary Rice is a 87 y.o. male with a hx of SVT, h/o hernia repair, appendectomy, prior h/o SBOwho is being seen 05/02/2021 for the evaluation of afib at the request of Dr Marlowe Sax.  Assessment & Plan    1.  Afib, atach, PACs Now back in sinus but unable to take POs Continue diltiazem drip for now Convert back to oral metoprolol when able Eliquis on hold for now.  Would not start IV heparin given recent surgery. Once taking POs and ok from a surgical stand point, would resume eliquis Echo remains pending  2. Ascending aortic dilation/ aneurysm (4.8cm) Follow-up with Dr Marlou Porch in the office.  Thompson Grayer MD, [facility] 05/04/2021 8:28 AM

## 2021-05-04 NOTE — Progress Notes (Signed)
PROGRESS NOTE    [facility]  [id number] DOB: 1931/05/26 DOA: 05/02/2021 PCP: Josetta Huddle, MD   Brief Narrative:  Gary Rice is a 87 y.o. male with medical history significant of SVT, hyperlipidemia, appendectomy, bilateral inguinal hernias, previous hernia repair presented to the ED complaining of vomiting.  He reported that his right inguinal hernia had popped and he was sent by his doctor for evaluation of possible bowel obstruction.  In the ED, patient found to be in new onset A. fib with rate in the 160s. CT showing findings consistent with mechanical SBO with transition point related to right inguinal hernia; no perforation.  Moderate fat and small bowel containing left inguinal hernia without adverse features.  ED physician was not able to reduce the inguinal hernia.  General surgery recommended NG tube - given concern for incarcerated RIGHT inguinal hernia patient was taken to OR for repair with mesh overnight 05/03/21.  Assessment & Plan:   Principal Problem:   Atrial fibrillation with rapid ventricular response (HCC) Active Problems:   SBO (small bowel obstruction) (HCC)   Leukocytosis   Hyperlipidemia   Thoracic aortic aneurysm (HCC)   Acute intractable nausea vomiting  -Worsening late this evening on the 6th - continue NPO/NG tube  New onset A. fib with RVR, likely provoked given below, poorly controlled - Cardiology consulted given poor control with cardizem drip - remains poorly controlled - PRN metoprolol today without improvement - Cardiology recommending continuing IV Cardizem drip (can go up to 20 mg/h).  Add IV metoprolol 5 mg every 4-6 hours scheduled if heart rate is still higher than 120s or PRN.  If still uncontrolled then consider IV digoxin 500 mcg and then 6 hours later give IV digoxin 250 mcg x 2 (6 hours interval each dose). - CHA2DS2-VASc 2 based on age (no clear history of hypertension and not on meds).  Patient is currently going to the OR on  an urgent basis.  Cardiology recommending starting Eliquis 5 mg twice daily for anticoagulation when safe from surgical standpoint or prior to discharge. - IV fluids and monitor blood pressure - TSH WNL - Echo - EF 60-65%  SBO with incarcerated R inguinal hernia -Gen Sx following - urgent repair overnight 05/03/21 with mesh -Pain control, diet advancement, and ambulation status per surgery   Leukocytosis, likely reactive given above, resolving - WBC downtrending appropriately with supportive care/surgery - WNL now   Hyperlipidemia -Resume statin when no longer n.p.o.   Ascending thoracic aortic aneurysm CT showing aneurysmal dilatation of the ascending aorta up to 4.8 cm. -Radiologist recommending semiannual imaging follow-up by CTA or MRA -Will need cardiothoracic surgery follow-up outpatient per protocol   DVT prophylaxis: SCDs Code Status: FULL Family Communication: Over the phone  Status is: Inpt  Dispo: The patient is from: Home              Anticipated d/c is to: TBD              Anticipated d/c date is: >72h              Patient currently NOT medically stable for discharge  Consultants:  General Surgery, Cardiology  Procedures:  Hernia/mesh repair 05/03/21  Antimicrobials:  None   Subjective: No acute issues overnight - denies headache fevers and chills  Objective: Vitals:   05/03/21 2049 05/03/21 2200 05/03/21 2353 05/04/21 0300  BP: 100/68 103/65 95/73 (!) 111/58  Pulse: (!) 130  96 77  Resp: '18 18 16 '$ 18  Temp: 97.8 F (36.6 C)  98.9 F (37.2 C) 98.8 F (37.1 C)  TempSrc: Oral  Oral Oral  SpO2:  94% 94% 94%  Weight:      Height:        Intake/Output Summary (Last 24 hours) at 05/04/2021 0755 Last data filed at 05/04/2021 0300 Gross per 24 hour  Intake 1697.32 ml  Output 2800 ml  Net -1102.68 ml    Filed Weights   05/02/21 1805  Weight: 77.1 kg    Examination:  General exam: Appears calm and comfortable  Respiratory system: Clear to  auscultation. Respiratory effort normal. Cardiovascular system: S1 & S2 heard, RRR. No JVD, murmurs, rubs, gallops or clicks. No pedal edema. Gastrointestinal system: Abdomen is nondistended, soft and nontender. No organomegaly or masses felt. Normal bowel sounds heard. Central nervous system: Alert and oriented. No focal neurological deficits. Extremities: Symmetric 5 x 5 power. Skin: No rashes, lesions or ulcers Psychiatry: Judgement and insight appear normal. Mood & affect appropriate.   Data Reviewed: I have personally reviewed following labs and imaging studies  CBC: Recent Labs  Lab 05/02/21 1806 05/02/21 1814 05/03/21 0150 05/04/21 0540  WBC 18.0*  --  12.3* 6.2  NEUTROABS 13.7*  --   --   --   HGB 16.8 17.0 13.0 13.9  HCT 49.2 50.0 38.6* 41.1  MCV 92.7  --  94.1 92.4  PLT 226  --  179 [id number]    Basic Metabolic Panel: Recent Labs  Lab 05/02/21 1806 05/02/21 1814 05/03/21 0150 05/04/21 0540  NA 132* 134* 135 136  K 4.1 4.1 3.7 3.2*  CL 96* 98 99 96*  CO2 24  --  27 29  GLUCOSE 124* 128* 126* 108*  BUN '21 22 18 '$ 27*  CREATININE 1.03 1.00 1.09 1.06  CALCIUM 10.3  --  8.8* 8.8*  MG 2.1  --   --   --     GFR: Estimated Creatinine Clearance: 51.3 mL/min (by C-G formula based on SCr of 1.06 mg/dL). Liver Function Tests: Recent Labs  Lab 05/02/21 1806  AST 31  ALT 20  ALKPHOS 63  BILITOT 1.3*  PROT 8.0  ALBUMIN 4.1    Recent Labs  Lab 05/02/21 1806  LIPASE 27    No results for input(s): AMMONIA in the last 168 hours. Coagulation Profile: No results for input(s): INR, PROTIME in the last 168 hours. Cardiac Enzymes: No results for input(s): CKTOTAL, CKMB, CKMBINDEX, TROPONINI in the last 168 hours. BNP (last 3 results) No results for input(s): PROBNP in the last 8760 hours. HbA1C: No results for input(s): HGBA1C in the last 72 hours. CBG: No results for input(s): GLUCAP in the last 168 hours. Lipid Profile: No results for input(s): CHOL, HDL,  LDLCALC, TRIG, CHOLHDL, LDLDIRECT in the last 72 hours. Thyroid Function Tests: Recent Labs    05/03/21 0150  TSH 2.342    Anemia Panel: No results for input(s): VITAMINB12, FOLATE, FERRITIN, TIBC, IRON, RETICCTPCT in the last 72 hours. Sepsis Labs: No results for input(s): PROCALCITON, LATICACIDVEN in the last 168 hours.  Recent Results (from the past 240 hour(s))  Resp Panel by RT-PCR (Flu A&B, Covid) Nasopharyngeal Swab     Status: None   Collection Time: 05/02/21  9:40 PM   Specimen: Nasopharyngeal Swab; Nasopharyngeal(NP) swabs in vial transport medium  Result Value Ref Range Status   SARS Coronavirus 2 by RT PCR NEGATIVE NEGATIVE Final    Comment: (NOTE) SARS-CoV-2 target nucleic acids are NOT DETECTED.  The  SARS-CoV-2 RNA is generally detectable in upper respiratory specimens during the acute phase of infection. The lowest concentration of SARS-CoV-2 viral copies this assay can detect is 138 copies/mL. A negative result does not preclude SARS-Cov-2 infection and should not be used as the sole basis for treatment or other patient management decisions. A negative result may occur with  improper specimen collection/handling, submission of specimen other than nasopharyngeal swab, presence of viral mutation(s) within the areas targeted by this assay, and inadequate number of viral copies(<138 copies/mL). A negative result must be combined with clinical observations, patient history, and epidemiological information. The expected result is Negative.  Fact Sheet for Patients:  EntrepreneurPulse.com.au  Fact Sheet for Healthcare Providers:  IncredibleEmployment.be  This test is no t yet approved or cleared by the [place] FDA and  has been authorized for detection and/or diagnosis of SARS-CoV-2 by FDA under an Emergency Use Authorization (EUA). This EUA will remain  in effect (meaning this test can be used) for the duration of  the COVID-19 declaration under Section 564(b)(1) of the Act, 21 U.S.C.section 360bbb-3(b)(1), unless the authorization is terminated  or revoked sooner.       Influenza A by PCR NEGATIVE NEGATIVE Final   Influenza B by PCR NEGATIVE NEGATIVE Final    Comment: (NOTE) The Xpert Xpress SARS-CoV-2/FLU/RSV plus assay is intended as an aid in the diagnosis of influenza from Nasopharyngeal swab specimens and should not be used as a sole basis for treatment. Nasal washings and aspirates are unacceptable for Xpert Xpress SARS-CoV-2/FLU/RSV testing.  Fact Sheet for Patients: EntrepreneurPulse.com.au  Fact Sheet for Healthcare Providers: IncredibleEmployment.be  This test is not yet approved or cleared by the [place] FDA and has been authorized for detection and/or diagnosis of SARS-CoV-2 by FDA under an Emergency Use Authorization (EUA). This EUA will remain in effect (meaning this test can be used) for the duration of the COVID-19 declaration under Section 564(b)(1) of the Act, 21 U.S.C. section 360bbb-3(b)(1), unless the authorization is terminated or revoked.  Performed at [facility], [address], [place], [place] [address]           Radiology Studies: CT Angio Chest PE W and/or Wo Contrast  Result Date: 05/02/2021 CLINICAL DATA:  Blockage unable to tolerate p.o. EXAM: CT ANGIOGRAPHY CHEST CT ABDOMEN AND PELVIS WITH CONTRAST TECHNIQUE: Multidetector CT imaging of the chest was performed using the standard protocol during bolus administration of intravenous contrast. Multiplanar CT image reconstructions and MIPs were obtained to evaluate the vascular anatomy. Multidetector CT imaging of the abdomen and pelvis was performed using the standard protocol during bolus administration of intravenous contrast. CONTRAST:  150m OMNIPAQUE IOHEXOL 350 MG/ML SOLN COMPARISON:  Radiography 05/02/2021 FINDINGS: CTA CHEST FINDINGS Cardiovascular:  Satisfactory opacification of the pulmonary arteries to the segmental level. No evidence of pulmonary embolism. Mild aortic atherosclerosis. Aneurysmal dilatation of the ascending aorta up to 4.8 cm. Normal cardiac size. Mild coronary vascular calcification. No pericardial effusion Mediastinum/Nodes: No enlarged mediastinal, hilar, or axillary lymph nodes. Thyroid gland, trachea, and esophagus demonstrate no significant findings. Lungs/Pleura: Lungs are clear. No pleural effusion or pneumothorax. Musculoskeletal: No chest wall abnormality. No acute or significant osseous findings. Review of the MIP images confirms the above findings. CT ABDOMEN and PELVIS FINDINGS Hepatobiliary: Hyperdense sludge or excreted contrast within the gallbladder. No biliary dilatation. Subcentimeter hypodensities in the liver too small to further characterize. Small cyst in the left hepatic lobe. Pancreas: Unremarkable. No pancreatic ductal dilatation or surrounding inflammatory changes.  Spleen: Normal in size without focal abnormality. Adrenals/Urinary Tract: Adrenal glands are normal. Bilateral renal cysts. 9.4 cm cyst in the upper pole left kidney. The bladder is unremarkable Stomach/Bowel: Fluid distension of the stomach, proximal and mid small bowel consistent with obstruction. Transition point related to right inguinal hernia containing fat and short segment of small bowel. Small bowel distal to the hernia is decompressed. No acute bowel wall thickening. Vascular/Lymphatic: Moderate aortic atherosclerosis. No aneurysm. No suspicious nodes Reproductive: Enlarged prostate Other: Moderate left inguinal hernia containing fat and decompressed small bowel. Musculoskeletal: Orthopedic hardware right hip with artifact. No acute osseous abnormality Review of the MIP images confirms the above findings. IMPRESSION: 1. Negative for acute pulmonary embolus.  Clear lung fields. 2. Findings consistent with mechanical small bowel obstruction,  transition point related to right inguinal hernia. No perforation. 3. Moderate fat and small bowel containing left inguinal hernia without adverse features 4. Aneurysmal dilatation of the ascending aorta up to 4.8 cm. Ascending thoracic aortic aneurysm. Recommend semi-annual imaging followup by CTA or MRA and referral to cardiothoracic surgery if not already obtained. This recommendation follows 2010 ACCF/AHA/AATS/ACR/ASA/SCA/SCAI/SIR/STS/SVM Guidelines for the Diagnosis and Management of Patients With Thoracic Aortic Disease. Circulation. 2010; [id number]. Aortic aneurysm NOS (ICD10-I71.9) Electronically Signed   By: Donavan Foil M.D.   On: 05/02/2021 19:56   CT ABDOMEN PELVIS W CONTRAST  Result Date: 05/02/2021 CLINICAL DATA:  Blockage unable to tolerate p.o. EXAM: CT ANGIOGRAPHY CHEST CT ABDOMEN AND PELVIS WITH CONTRAST TECHNIQUE: Multidetector CT imaging of the chest was performed using the standard protocol during bolus administration of intravenous contrast. Multiplanar CT image reconstructions and MIPs were obtained to evaluate the vascular anatomy. Multidetector CT imaging of the abdomen and pelvis was performed using the standard protocol during bolus administration of intravenous contrast. CONTRAST:  181m OMNIPAQUE IOHEXOL 350 MG/ML SOLN COMPARISON:  Radiography 05/02/2021 FINDINGS: CTA CHEST FINDINGS Cardiovascular: Satisfactory opacification of the pulmonary arteries to the segmental level. No evidence of pulmonary embolism. Mild aortic atherosclerosis. Aneurysmal dilatation of the ascending aorta up to 4.8 cm. Normal cardiac size. Mild coronary vascular calcification. No pericardial effusion Mediastinum/Nodes: No enlarged mediastinal, hilar, or axillary lymph nodes. Thyroid gland, trachea, and esophagus demonstrate no significant findings. Lungs/Pleura: Lungs are clear. No pleural effusion or pneumothorax. Musculoskeletal: No chest wall abnormality. No acute or significant osseous findings.  Review of the MIP images confirms the above findings. CT ABDOMEN and PELVIS FINDINGS Hepatobiliary: Hyperdense sludge or excreted contrast within the gallbladder. No biliary dilatation. Subcentimeter hypodensities in the liver too small to further characterize. Small cyst in the left hepatic lobe. Pancreas: Unremarkable. No pancreatic ductal dilatation or surrounding inflammatory changes. Spleen: Normal in size without focal abnormality. Adrenals/Urinary Tract: Adrenal glands are normal. Bilateral renal cysts. 9.4 cm cyst in the upper pole left kidney. The bladder is unremarkable Stomach/Bowel: Fluid distension of the stomach, proximal and mid small bowel consistent with obstruction. Transition point related to right inguinal hernia containing fat and short segment of small bowel. Small bowel distal to the hernia is decompressed. No acute bowel wall thickening. Vascular/Lymphatic: Moderate aortic atherosclerosis. No aneurysm. No suspicious nodes Reproductive: Enlarged prostate Other: Moderate left inguinal hernia containing fat and decompressed small bowel. Musculoskeletal: Orthopedic hardware right hip with artifact. No acute osseous abnormality Review of the MIP images confirms the above findings. IMPRESSION: 1. Negative for acute pulmonary embolus.  Clear lung fields. 2. Findings consistent with mechanical small bowel obstruction, transition point related to right inguinal hernia. No perforation. 3. Moderate fat  and small bowel containing left inguinal hernia without adverse features 4. Aneurysmal dilatation of the ascending aorta up to 4.8 cm. Ascending thoracic aortic aneurysm. Recommend semi-annual imaging followup by CTA or MRA and referral to cardiothoracic surgery if not already obtained. This recommendation follows 2010 ACCF/AHA/AATS/ACR/ASA/SCA/SCAI/SIR/STS/SVM Guidelines for the Diagnosis and Management of Patients With Thoracic Aortic Disease. Circulation. 2010; [id number]. Aortic aneurysm NOS  (ICD10-I71.9) Electronically Signed   By: Donavan Foil M.D.   On: 05/02/2021 19:56   DG CHEST PORT 1 VIEW  Result Date: 05/03/2021 CLINICAL DATA:  NG tube placement EXAM: PORTABLE CHEST 1 VIEW COMPARISON:  05/02/2021 FINDINGS: NG tube tip is in the fundus of the stomach. No confluent airspace opacities. Heart is normal size. No effusions. IMPRESSION: NG tube tip in the fundus of the stomach. No acute cardiopulmonary disease. Electronically Signed   By: Rolm Baptise M.D.   On: 05/03/2021 18:38   DG Chest Port 1 View  Result Date: 05/02/2021 CLINICAL DATA:  Shortness of breath EXAM: PORTABLE CHEST 1 VIEW COMPARISON:  08/04/2007 FINDINGS: Heart is normal size. Tortuous, ectatic thoracic aorta. Lungs clear. No effusions. No acute bony abnormality. IMPRESSION: No acute cardiopulmonary disease. Electronically Signed   By: Rolm Baptise M.D.   On: 05/02/2021 18:25   DG Abd Portable 1 View  Result Date: 05/03/2021 CLINICAL DATA:  NG tube placement EXAM: PORTABLE ABDOMEN - 1 VIEW COMPARISON:  05/02/2021 FINDINGS: NG tube tip is in the distal esophagus. Dilated small bowel loops again seen in the abdomen. IMPRESSION: NG tube tip in the distal esophagus. Electronically Signed   By: Rolm Baptise M.D.   On: 05/03/2021 00:45   DG Abd Portable 1 View  Result Date: 05/02/2021 CLINICAL DATA:  Shortness of breath, abdominal pain EXAM: PORTABLE ABDOMEN - 1 VIEW COMPARISON:  None. FINDINGS: Nonspecific bowel gas pattern. Gas throughout nondistended large and small bowel. No evidence of bowel obstruction. No organomegaly, suspicious calcification or free air. Visualized lung bases clear. IMPRESSION: No evidence of bowel obstruction or free air. Electronically Signed   By: Rolm Baptise M.D.   On: 05/02/2021 18:26        Scheduled Meds:  acetaminophen  1,000 mg Oral Q6H   enoxaparin (LOVENOX) injection  40 mg Subcutaneous Q24H   ketorolac  15 mg Intravenous Q6H   methocarbamol  1,000 mg Oral Q8H   metoprolol  succinate  25 mg Oral Daily   pantoprazole  40 mg Oral BID   Continuous Infusions:  diltiazem (CARDIZEM) infusion 10 mg/hr (05/03/21 2234)   lactated ringers 70 mL/hr at 05/03/21 1843   promethazine (PHENERGAN) injection (IM or IVPB) 70 mL/hr at 05/04/21 0230     LOS: 2 days    Time spent: 61mn   Gary Morgan C Aubryn Spinola, DO [facility]  If 7PM-7AM, please contact night-coverage [email or web address]  05/04/2021, 7:55 AM

## 2021-05-04 NOTE — Progress Notes (Signed)
  Echocardiogram 2D Echocardiogram has been performed.  Gary Rice F 05/04/2021, 11:57 AM

## 2021-05-04 NOTE — Evaluation (Signed)
Physical Therapy Evaluation Patient Details Name: Gary Rice MRN: [id number] DOB: Jul 16, 1931 Today's Date: 05/04/2021   History of Present Illness  87yo male admitted 8/522 after being referred to ED by PCP secondary to incarcerated inguinal hernia causing SBO, also found to be in Afib with RVR in the ED. Received surgical repair of incarcerated inguinal hernia 05/02/21. PMH hernia repair joint replacement  Clinical Impression   Patient received in bed, pleasant and cooperative with PT- motivated to return home and improve physically. Does require as much as ModA for functional mobility today, and PT eval limited by vitals today- SpO2 86-91% on room air and HR in Afib with RVR with max observed HR increase to 146BPM during transfers. Did not attempt gait today, but did practice multiple transfers as he did need to return to bed at [facility] for ECHO study. RN assisted with second transfer and observed Afib with RVR with activity. Left in bed with all needs met, bed alarm active and RN aware of patient status. Would benefit from SNF, but refusing and really prefers home with HHPT. Will continue to follow.     Follow Up Recommendations Home health PT;Supervision/Assistance - 24 hour;Other (comment) (refusing SNF)    Equipment Recommendations  Rolling walker with 5" wheels;3in1 (PT);Wheelchair (measurements PT);Wheelchair cushion (measurements PT)    Recommendations for Other Services       Precautions / Restrictions Precautions Precautions: Fall;Other (comment) Precaution Comments: s/p inguinal hernia repair, NG tube, watch SPO2 and HR Restrictions Weight Bearing Restrictions: No      Mobility  Bed Mobility Overal bed mobility: Needs Assistance Bed Mobility: Supine to Sit;Sit to Supine     Supine to sit: HOB elevated;Mod assist Sit to supine: Min guard;HOB elevated   General bed mobility comments: ModA to elevate trunk with HOB elevated, but only needed Min guard for return to supine  with HOB elevated    Transfers Overall transfer level: Needs assistance Equipment used: Rolling walker (2 wheeled) Transfers: Sit to/from [facility] Sit to Stand: Mod assist Stand pivot transfers: Min assist       General transfer comment: ModA to boost up to standing with cues for hand placement/sequencing; MinA to maintain balance while pivoting to recliner  Ambulation/Gait             General Gait Details: deferred- Afib with RVR to 146BPM at [employer] Mobility    Modified Rankin (Stroke Patients Only)       Balance Overall balance assessment: Needs assistance Sitting-balance support: Bilateral upper extremity supported;Feet supported Sitting balance-Leahy Scale: Good     Standing balance support: Bilateral upper extremity supported;During functional activity Standing balance-Leahy Scale: Poor Standing balance comment: MinA for dynamic balance with RW                             Pertinent Vitals/Pain Pain Assessment: No/denies pain    Home Living Family/patient expects to be discharged to:: Private residence Living Arrangements: Spouse/significant other Available Help at Discharge: Family;Available PRN/intermittently Type of Home: House Home Access: Stairs to enter Entrance Stairs-Rails: Right Entrance Stairs-Number of Steps: 11 STE to enter thru garage with R ascending rail; back entrance has 4 STE with B rails Home Layout: Two level Home Equipment: Grab bars - tub/shower;Cane - single point Additional Comments: one fall recently- fell making a turn into the bathroom and hit hard on R  hip but no injuries besides bruising. only uses SPC PRN    Prior Function Level of Independence: Independent with assistive device(s)         Comments: cuts own lawn, still driving, going to grocery store; still managing bills and meds     Hand Dominance        Extremity/Trunk Assessment   Upper  Extremity Assessment Upper Extremity Assessment: Defer to OT evaluation    Lower Extremity Assessment Lower Extremity Assessment: Generalized weakness    Cervical / Trunk Assessment Cervical / Trunk Assessment: Kyphotic  Communication   Communication: No difficulties  Cognition Arousal/Alertness: Awake/alert Behavior During Therapy: WFL for tasks assessed/performed Overall Cognitive Status: Within Functional Limits for tasks assessed                                        General Comments General comments (skin integrity, edema, etc.): SpO2 as low as 86% on room air with activity, able to recover to 91% with PLB RA; HR rhythm change to Afib with RVR with mobility, highest observed HR 146BPM    Exercises     Assessment/Plan    PT Assessment Patient needs continued PT services  PT Problem List Decreased strength;Decreased knowledge of use of DME;Decreased activity tolerance;Decreased safety awareness;Decreased balance;Decreased mobility;Decreased coordination;Cardiopulmonary status limiting activity       PT Treatment Interventions DME instruction;Balance training;Gait training;Stair training;Functional mobility training;Patient/family education;Therapeutic activities;Therapeutic exercise    PT Goals (Current goals can be found in the Care Plan section)  Acute Rehab PT Goals Patient Stated Goal: go home and start HHPT, does not want to go to SNF PT Goal Formulation: With patient Time For Goal Achievement: 05/18/21 Potential to Achieve Goals: Fair    Frequency Min 4X/week   Barriers to discharge        Co-evaluation               AM-PAC PT "6 Clicks" Mobility  Outcome Measure Help needed turning from your back to your side while in a flat bed without using bedrails?: A Little Help needed moving from lying on your back to sitting on the side of a flat bed without using bedrails?: A Lot Help needed moving to and from a bed to a chair (including a  wheelchair)?: A Little Help needed standing up from a chair using your arms (e.g., wheelchair or bedside chair)?: A Lot Help needed to walk in hospital room?: A Little Help needed climbing 3-5 steps with a railing? : A Lot 6 Click Score: 15    End of Session Equipment Utilized During Treatment: Gait belt Activity Tolerance: Patient tolerated treatment well Patient left: in bed;with call bell/phone within reach;with bed alarm set Nurse Communication: Mobility status PT Visit Diagnosis: Unsteadiness on feet (R26.81);Difficulty in walking, not elsewhere classified (R26.2);Muscle weakness (generalized) (M62.81);History of falling (Z91.81)    Time: 8185-6314 PT Time Calculation (min) (ACUTE ONLY): 53 min   Charges:   PT Evaluation $PT Eval Moderate Complexity: 1 Mod PT Treatments $Therapeutic Activity: 23-37 mins $Self Care/Home Management: 8-22       Windell Norfolk, DPT, PN2   Supplemental Physical Therapist [place]    Pager [phone] Acute Rehab Office [phone]

## 2021-05-05 ENCOUNTER — Encounter (HOSPITAL_COMMUNITY): Payer: Self-pay | Admitting: Surgery

## 2021-05-05 ENCOUNTER — Telehealth (HOSPITAL_COMMUNITY): Payer: Self-pay | Admitting: Physician Assistant

## 2021-05-05 ENCOUNTER — Other Ambulatory Visit (HOSPITAL_COMMUNITY): Payer: Self-pay

## 2021-05-05 ENCOUNTER — Inpatient Hospital Stay (HOSPITAL_COMMUNITY): Payer: Medicare Other

## 2021-05-05 DIAGNOSIS — I4891 Unspecified atrial fibrillation: Secondary | ICD-10-CM | POA: Diagnosis not present

## 2021-05-05 LAB — BASIC METABOLIC PANEL
Anion gap: 9 (ref 5–15)
BUN: 39 mg/dL — ABNORMAL HIGH (ref 8–23)
CO2: 30 mmol/L (ref 22–32)
Calcium: 8.6 mg/dL — ABNORMAL LOW (ref 8.9–10.3)
Chloride: 96 mmol/L — ABNORMAL LOW (ref 98–111)
Creatinine, Ser: 1.17 mg/dL (ref 0.61–1.24)
GFR, Estimated: 60 mL/min — ABNORMAL LOW (ref >60)
Glucose, Bld: 98 mg/dL (ref 70–99)
Potassium: 3.2 mmol/L — ABNORMAL LOW (ref 3.5–5.1)
Sodium: 135 mmol/L (ref 135–145)

## 2021-05-05 LAB — GLUCOSE, CAPILLARY: Glucose-Capillary: 96 mg/dL (ref 70–99)

## 2021-05-05 MED ORDER — IOHEXOL 9 MG/ML PO SOLN
500.0000 mL | ORAL | Status: AC
  Administered 2021-05-05 (×2): 500 mL via ORAL

## 2021-05-05 MED ORDER — POTASSIUM CHLORIDE 10 MEQ/100ML IV SOLN
10.0000 meq | INTRAVENOUS | Status: AC
Start: 2021-05-05 — End: 2021-05-05
  Administered 2021-05-05 (×6): 10 meq via INTRAVENOUS
  Filled 2021-05-05 (×6): qty 100

## 2021-05-05 NOTE — Progress Notes (Signed)
SLP Cancellation Note  Patient Details Name: Gary Rice MRN: [id number] DOB: 03/11/1931   Cancelled treatment:       Reason Eval/Treat Not Completed: Patient not medically ready. Still with high NGT output, surgical team recommending he remain NPO. Will f/u pending medical clearance to resume POs.     Osie Bond., M.A. [facility] Pager [phone] Office [phone]  05/05/2021, 8:48 AM

## 2021-05-05 NOTE — Progress Notes (Signed)
PROGRESS NOTE    Gary Rice [facility]  [id number] DOB: [birth year] DOA: 05/02/2021 PCP: Josetta Huddle, MD   Brief Narrative:  Gary Rice is a 87 y.o. male with medical history significant of SVT, hyperlipidemia, appendectomy, bilateral inguinal hernias, previous hernia repair presented to the ED complaining of vomiting.  He reported that his right inguinal hernia had popped and he was sent by his doctor for evaluation of possible bowel obstruction.  In the ED, patient found to be in new onset A. fib with rate in the 160s. CT showing findings consistent with mechanical SBO with transition point related to right inguinal hernia; no perforation.  Moderate fat and small bowel containing left inguinal hernia without adverse features.  ED physician was not able to reduce the inguinal hernia.  General surgery recommended NG tube - given concern for incarcerated RIGHT inguinal hernia patient was taken to OR for repair with mesh overnight 05/03/21.  Assessment & Plan:   Principal Problem:   Atrial fibrillation with rapid ventricular response (HCC) Active Problems:   SBO (small bowel obstruction) (HCC)   Leukocytosis   Hyperlipidemia   Thoracic aortic aneurysm (HCC)   Acute intractable nausea vomiting  -Worsening late this evening on the 6th - continue NPO/NG tube -Transition all meds to IV given ongoing profound NG output  New onset A. fib with RVR, likely provoked given below, poorly controlled - Cardiology consulted given poor control with cardizem drip - remains poorly controlled due to NPO status - Cardiology recommending continuing IV Cardizem drip, IV metoprolol 5 mg every 4-6 hours PRN - CHA2DS2-VASc 2 based on age (no clear history of hypertension and not currently on meds).  Patient is currently going to the OR on an urgent basis.  Cardiology recommending starting Eliquis 5 mg twice daily for anticoagulation when safe from surgical standpoint or prior to discharge. - IV fluids and  monitor blood pressure - TSH WNL - Echo - EF 60-65%  SBO with incarcerated R inguinal hernia -Gen Sx following - urgent repair overnight 05/03/21 with mesh -Pain control, diet advancement, and ambulation status per surgery   Leukocytosis, likely reactive given above, resolving - WBC downtrending appropriately with supportive care/surgery - WNL now   Hyperlipidemia -Resume statin when no longer n.p.o.   Ascending thoracic aortic aneurysm CT showing aneurysmal dilatation of the ascending aorta up to 4.8 cm. -Radiologist recommending semiannual imaging follow-up by CTA or MRA -Will need cardiothoracic surgery follow-up outpatient per protocol   DVT prophylaxis: SCDs Code Status: FULL Family Communication: Over the phone  Status is: Inpt  Dispo: The patient is from: Home              Anticipated d/c is to: TBD              Anticipated d/c date is: >72h              Patient currently NOT medically stable for discharge  Consultants:  General Surgery, Cardiology  Procedures:  Hernia/mesh repair 05/03/21  Antimicrobials:  None   Subjective: No acute issues overnight - denies headache fevers and chills; tolerating NG tube well  Objective: Vitals:   05/04/21 1650 05/04/21 1927 05/05/21 0006 05/05/21 0340  BP: 96/66 90/70 107/62 101/78  Pulse:  77 71 74  Resp:  '17 19 19  '$ Temp:  97.7 F (36.5 C) 98.2 F (36.8 C) 98.1 F (36.7 C)  TempSrc:  Oral Oral Oral  SpO2:  94% 92% 95%  Weight:  Height:        Intake/Output Summary (Last 24 hours) at 05/05/2021 0746 Last data filed at 05/05/2021 0726 Gross per 24 hour  Intake 3561.01 ml  Output 1925 ml  Net 1636.01 ml    Filed Weights   05/02/21 1805  Weight: 77.1 kg    Examination:  General exam: Appears calm and comfortable  Respiratory system: Clear to auscultation. Respiratory effort normal. Cardiovascular system: S1 & S2 heard, RRR. No JVD, murmurs, rubs, gallops or clicks. No pedal edema. Gastrointestinal system:  Abdomen is nondistended, soft and nontender. No organomegaly or masses felt. NG tube to suction. Central nervous system: Alert and oriented. No focal neurological deficits. Extremities: Symmetric 5 x 5 power. Skin: No rashes, lesions or ulcers Psychiatry: Judgement and insight appear normal. Mood & affect appropriate.   Data Reviewed: I have personally reviewed following labs and imaging studies  CBC: Recent Labs  Lab 05/02/21 1806 05/02/21 1814 05/03/21 0150 05/04/21 0540  WBC 18.0*  --  12.3* 6.2  NEUTROABS 13.7*  --   --   --   HGB 16.8 17.0 13.0 13.9  HCT 49.2 50.0 38.6* 41.1  MCV 92.7  --  94.1 92.4  PLT 226  --  179 [id number]    Basic Metabolic Panel: Recent Labs  Lab 05/02/21 1806 05/02/21 1814 05/03/21 0150 05/04/21 0540 05/05/21 0152  NA 132* 134* 135 136 135  K 4.1 4.1 3.7 3.2* 3.2*  CL 96* 98 99 96* 96*  CO2 24  --  '27 29 30  '$ GLUCOSE 124* 128* 126* 108* 98  BUN '21 22 18 '$ 27* 39*  CREATININE 1.03 1.00 1.09 1.06 1.17  CALCIUM 10.3  --  8.8* 8.8* 8.6*  MG 2.1  --   --   --   --     GFR: Estimated Creatinine Clearance: 46.5 mL/min (by C-G formula based on SCr of 1.17 mg/dL). Liver Function Tests: Recent Labs  Lab 05/02/21 1806  AST 31  ALT 20  ALKPHOS 63  BILITOT 1.3*  PROT 8.0  ALBUMIN 4.1    Recent Labs  Lab 05/02/21 1806  LIPASE 27    No results for input(s): AMMONIA in the last 168 hours. Coagulation Profile: No results for input(s): INR, PROTIME in the last 168 hours. Cardiac Enzymes: No results for input(s): CKTOTAL, CKMB, CKMBINDEX, TROPONINI in the last 168 hours. BNP (last 3 results) No results for input(s): PROBNP in the last 8760 hours. HbA1C: No results for input(s): HGBA1C in the last 72 hours. CBG: No results for input(s): GLUCAP in the last 168 hours. Lipid Profile: No results for input(s): CHOL, HDL, LDLCALC, TRIG, CHOLHDL, LDLDIRECT in the last 72 hours. Thyroid Function Tests: Recent Labs    05/03/21 0150  TSH 2.342     Anemia Panel: No results for input(s): VITAMINB12, FOLATE, FERRITIN, TIBC, IRON, RETICCTPCT in the last 72 hours. Sepsis Labs: No results for input(s): PROCALCITON, LATICACIDVEN in the last 168 hours.  Recent Results (from the past 240 hour(s))  Resp Panel by RT-PCR (Flu A&B, Covid) Nasopharyngeal Swab     Status: None   Collection Time: 05/02/21  9:40 PM   Specimen: Nasopharyngeal Swab; Nasopharyngeal(NP) swabs in vial transport medium  Result Value Ref Range Status   SARS Coronavirus 2 by RT PCR NEGATIVE NEGATIVE Final    Comment: (NOTE) SARS-CoV-2 target nucleic acids are NOT DETECTED.  The SARS-CoV-2 RNA is generally detectable in upper respiratory specimens during the acute phase of infection. The lowest concentration of SARS-CoV-2  viral copies this assay can detect is 138 copies/mL. A negative result does not preclude SARS-Cov-2 infection and should not be used as the sole basis for treatment or other patient management decisions. A negative result may occur with  improper specimen collection/handling, submission of specimen other than nasopharyngeal swab, presence of viral mutation(s) within the areas targeted by this assay, and inadequate number of viral copies(<138 copies/mL). A negative result must be combined with clinical observations, patient history, and epidemiological information. The expected result is Negative.  Fact Sheet for Patients:  EntrepreneurPulse.com.au  Fact Sheet for Healthcare Providers:  IncredibleEmployment.be  This test is no t yet approved or cleared by the [place] FDA and  has been authorized for detection and/or diagnosis of SARS-CoV-2 by FDA under an Emergency Use Authorization (EUA). This EUA will remain  in effect (meaning this test can be used) for the duration of the COVID-19 declaration under Section 564(b)(1) of the Act, 21 U.S.C.section 360bbb-3(b)(1), unless the authorization is  terminated  or revoked sooner.       Influenza A by PCR NEGATIVE NEGATIVE Final   Influenza B by PCR NEGATIVE NEGATIVE Final    Comment: (NOTE) The Xpert Xpress SARS-CoV-2/FLU/RSV plus assay is intended as an aid in the diagnosis of influenza from Nasopharyngeal swab specimens and should not be used as a sole basis for treatment. Nasal washings and aspirates are unacceptable for Xpert Xpress SARS-CoV-2/FLU/RSV testing.  Fact Sheet for Patients: EntrepreneurPulse.com.au  Fact Sheet for Healthcare Providers: IncredibleEmployment.be  This test is not yet approved or cleared by the [place] FDA and has been authorized for detection and/or diagnosis of SARS-CoV-2 by FDA under an Emergency Use Authorization (EUA). This EUA will remain in effect (meaning this test can be used) for the duration of the COVID-19 declaration under Section 564(b)(1) of the Act, 21 U.S.C. section 360bbb-3(b)(1), unless the authorization is terminated or revoked.  Performed at [facility], [place] [address], [place], [place] [address]           Radiology Studies: DG CHEST PORT 1 VIEW  Result Date: 05/03/2021 CLINICAL DATA:  NG tube placement EXAM: PORTABLE CHEST 1 VIEW COMPARISON:  05/02/2021 FINDINGS: NG tube tip is in the fundus of the stomach. No confluent airspace opacities. Heart is normal size. No effusions. IMPRESSION: NG tube tip in the fundus of the stomach. No acute cardiopulmonary disease. Electronically Signed   By: Rolm Baptise M.D.   On: 05/03/2021 18:38   ECHOCARDIOGRAM COMPLETE  Result Date: 05/04/2021    ECHOCARDIOGRAM REPORT   Patient Name:   Gary Rice Date of Exam: 05/04/2021 Medical Rec #:  [id number]          Height:       71.0 in Accession #:    [id number]         Weight:       170.0 lb Date of Birth:  12-21-1930           BSA:          1.968 m Patient Age:    48 years           BP:           106/68 mmHg Patient Gender: M                   HR:           83 bpm. Exam Location:  Inpatient Procedure: 2D Echo, Cardiac Doppler, Color Doppler and 3D Echo  Indications:    I48.91* Unspeicified atrial fibrillation  History:        Patient has prior history of Echocardiogram examinations, most                 recent 08/16/2017.  Sonographer:    Merrie Roof RDCS Referring Phys: [id number] [facility]  Sonographer Comments: Technically difficult study due to poor echo windows. IMPRESSIONS  1. Left ventricular ejection fraction, by estimation, is 60 to 65%. The left ventricle has normal function. The left ventricle has no regional wall motion abnormalities. There is mild left ventricular hypertrophy. Left ventricular diastolic parameters are indeterminate.  2. Right ventricular systolic function is normal. The right ventricular size is normal. Tricuspid regurgitation signal is inadequate for assessing PA pressure.  3. The mitral valve is degenerative. Trivial mitral valve regurgitation. No evidence of mitral stenosis.  4. The aortic valve is grossly normal. There is mild thickening of the aortic valve. Aortic valve regurgitation is trivial. No aortic stenosis is present.  5. The inferior vena cava is dilated in size with >50% respiratory variability, suggesting right atrial pressure of 8 mmHg. FINDINGS  Left Ventricle: Left ventricular ejection fraction, by estimation, is 60 to 65%. The left ventricle has normal function. The left ventricle has no regional wall motion abnormalities. 3D left ventricular ejection fraction analysis performed but not reported based on interpreter judgement due to suboptimal quality. The left ventricular internal cavity size was normal in size. There is mild left ventricular hypertrophy. Left ventricular diastolic parameters are indeterminate. Right Ventricle: The right ventricular size is normal. No increase in right ventricular wall thickness. Right ventricular systolic function is normal. Tricuspid regurgitation signal is  inadequate for assessing PA pressure. Left Atrium: Left atrial size was normal in size. Right Atrium: Right atrial size was normal in size. Pericardium: There is no evidence of pericardial effusion. Mitral Valve: The mitral valve is degenerative in appearance. Mild mitral annular calcification. Trivial mitral valve regurgitation. No evidence of mitral valve stenosis. Tricuspid Valve: The tricuspid valve is normal in structure. Tricuspid valve regurgitation is mild . No evidence of tricuspid stenosis. Aortic Valve: The aortic valve is grossly normal. There is mild thickening of the aortic valve. Aortic valve regurgitation is trivial. No aortic stenosis is present. Aortic valve mean gradient measures 4.0 mmHg. Aortic valve peak gradient measures 7.3 mmHg. Aortic valve area, by VTI measures 3.09 cm. Pulmonic Valve: The pulmonic valve was normal in structure. Pulmonic valve regurgitation is not visualized. No evidence of pulmonic stenosis. Aorta: The aortic root is normal in size and structure. Venous: The inferior vena cava is dilated in size with greater than 50% respiratory variability, suggesting right atrial pressure of 8 mmHg. IAS/Shunts: The interatrial septum was not well visualized.  LEFT VENTRICLE PLAX 2D LVIDd:         3.90 cm LVIDs:         2.70 cm LV PW:         0.90 cm LV IVS:        1.10 cm LVOT diam:     2.00 cm  3D Volume EF: LV SV:         74       3D EF:        69 % LV SV Index:   37 LVOT Area:     3.14 cm  RIGHT VENTRICLE RV Basal diam:  3.20 cm LEFT ATRIUM             Index  RIGHT ATRIUM           Index LA diam:        3.30 cm 1.68 cm/m  RA Area:     17.20 cm LA Vol (A2C):   53.2 ml 27.03 ml/m RA Volume:   41.90 ml  21.29 ml/m LA Vol (A4C):   48.3 ml 24.54 ml/m LA Biplane Vol: 52.0 ml 26.43 ml/m  AORTIC VALVE AV Area (Vmax):    3.00 cm AV Area (Vmean):   3.03 cm AV Area (VTI):     3.09 cm AV Vmax:           135.00 cm/s AV Vmean:          92.900 cm/s AV VTI:            0.238 m AV Peak  Grad:      7.3 mmHg AV Mean Grad:      4.0 mmHg LVOT Vmax:         129.00 cm/s LVOT Vmean:        89.700 cm/s LVOT VTI:          0.234 m LVOT/AV VTI ratio: 0.98  AORTA Ao Root diam: 3.30 cm Ao Asc diam:  3.63 cm  SHUNTS Systemic VTI:  0.23 m Systemic Diam: 2.00 cm Cherlynn Kaiser MD Electronically signed by Cherlynn Kaiser MD Signature Date/Time: 05/04/2021/1:17:47 PM    Final         Scheduled Meds:  acetaminophen (TYLENOL) oral liquid 160 mg/5 mL  1,000 mg Per Tube Q6H   enoxaparin (LOVENOX) injection  40 mg Subcutaneous Q24H   ketorolac  15 mg Intravenous Q6H   methocarbamol  1,000 mg Per Tube Q8H   metoprolol tartrate  12.5 mg Per Tube BID   pantoprazole sodium  40 mg Per Tube BID   Continuous Infusions:  diltiazem (CARDIZEM) infusion 10 mg/hr (05/05/21 0640)   lactated ringers 70 mL/hr at 05/05/21 0415   promethazine (PHENERGAN) injection (IM or IVPB) 70 mL/hr at 05/04/21 0230     LOS: 3 days    Time spent: 57mn   Harley Fitzwater C Lise Pincus, DO [facility]  If 7PM-7AM, please contact night-coverage [email or web address]  05/05/2021, 7:46 AM

## 2021-05-05 NOTE — Progress Notes (Signed)
Physical Therapy Treatment Patient Details Name: Gary Rice MRN: [id number] DOB: 04/07/1931 Today's Date: 05/05/2021    History of Present Illness 87yo male admitted 8/522 after being referred to ED by PCP secondary to incarcerated inguinal hernia causing SBO, also found to be in Afib with RVR in the ED. Received surgical repair of incarcerated inguinal hernia 05/02/21. PMH hernia repair joint replacement    PT Comments    Patient received in bed, NG tube intact. Nasal canula not really in nose with O2 sats at 93%. He is agreeable to PT session. Patient is supervision to min guard for bed mobility, transfers with supervision and ambulated 200 feet with RW and min guard. He does well, will benefit from continued skilled PT while here to improve mobilization and strength for return home. Patient left on room air in recliner at end of session. RN notified.      Follow Up Recommendations  Home health PT     Equipment Recommendations  Rolling walker with 5" wheels    Recommendations for Other Services       Precautions / Restrictions Precautions Precautions: Fall Precaution Comments: s/p inguinal hernia repair, NG tube, watch SPO2 and HR Restrictions Weight Bearing Restrictions: No    Mobility  Bed Mobility Overal bed mobility: Needs Assistance Bed Mobility: Supine to Sit     Supine to sit: Min guard;HOB elevated     General bed mobility comments: increased effort, wants to do it himself.    Transfers Overall transfer level: Needs assistance Equipment used: Rolling walker (2 wheeled) Transfers: Sit to/from Stand Sit to Stand: Supervision         General transfer comment: Patient HR stayed below 100 throughout bed mobility and sit to stand transfer  Ambulation/Gait Ambulation/Gait assistance: Min guard Gait Distance (Feet): 200 Feet Assistive device: Rolling walker (2 wheeled) Gait Pattern/deviations: Step-through pattern Gait velocity: slightly decreased dur  to lines. O2 saturations remained in low to mid 90%s, HR went up to 115 during ambulation.   General Gait Details: Patient ambulated with RW and min guard. Good pace and ability.   Stairs             Wheelchair Mobility    Modified Rankin (Stroke Patients Only)       Balance Overall balance assessment: Modified Independent Sitting-balance support: Feet supported Sitting balance-Leahy Scale: Good     Standing balance support: Bilateral upper extremity supported;During functional activity Standing balance-Leahy Scale: Good Standing balance comment: no external assist needed, pt able to let go of walker, using walker for support right now.                            Cognition Arousal/Alertness: Awake/alert Behavior During Therapy: WFL for tasks assessed/performed Overall Cognitive Status: Within Functional Limits for tasks assessed                                 General Comments: pleasant throughout      Exercises      General Comments        Pertinent Vitals/Pain Pain Assessment: No/denies pain    Home Living                      Prior Function            PT Goals (current goals can now be found in the care plan  section) Acute Rehab PT Goals Patient Stated Goal: To go home PT Goal Formulation: With patient Time For Goal Achievement: 05/18/21 Potential to Achieve Goals: Good Progress towards PT goals: Progressing toward goals    Frequency    Min 3X/week      PT Plan Frequency needs to be updated    Co-evaluation              AM-PAC PT "6 Clicks" Mobility   Outcome Measure  Help needed turning from your back to your side while in a flat bed without using bedrails?: A Little Help needed moving from lying on your back to sitting on the side of a flat bed without using bedrails?: A Little Help needed moving to and from a bed to a chair (including a wheelchair)?: A Little Help needed standing up from a  chair using your arms (e.g., wheelchair or bedside chair)?: A Little Help needed to walk in hospital room?: A Little Help needed climbing 3-5 steps with a railing? : A Little 6 Click Score: 18    End of Session Equipment Utilized During Treatment: Gait belt Activity Tolerance: Patient tolerated treatment well Patient left: in chair;with call bell/phone within reach Nurse Communication: Mobility status;Other (comment) (removed supplemental O2.) PT Visit Diagnosis: Muscle weakness (generalized) (M62.81)     Time: 1000-1030 PT Time Calculation (min) (ACUTE ONLY): 30 min  Charges:  $Gait Training: 23-37 mins                    Eustace Hur, PT, GCS 05/05/21,11:15 AM

## 2021-05-05 NOTE — Telephone Encounter (Signed)
Error

## 2021-05-05 NOTE — Telephone Encounter (Signed)
Called and left message for patient to call back, needs appt for ED follow up per Dr. Rayann Heman.

## 2021-05-05 NOTE — Progress Notes (Addendum)
Progress Note  Patient Name: Gary Rice [name] Date of Encounter: 05/05/2021  [facility] Cardiologist: Candee Furbish, MD   Subjective   Denies any cardiac awareness of afib. No SOB. No CP.   Inpatient Medications    Scheduled Meds:  acetaminophen (TYLENOL) oral liquid 160 mg/5 mL  1,000 mg Per Tube Q6H   enoxaparin (LOVENOX) injection  40 mg Subcutaneous Q24H   ketorolac  15 mg Intravenous Q6H   methocarbamol  1,000 mg Per Tube Q8H   metoprolol tartrate  12.5 mg Per Tube BID   pantoprazole sodium  40 mg Per Tube BID   Continuous Infusions:  diltiazem (CARDIZEM) infusion 10 mg/hr (05/05/21 0640)   lactated ringers 70 mL/hr at 05/05/21 0415   potassium chloride 10 mEq (05/05/21 0917)   promethazine (PHENERGAN) injection (IM or IVPB) 70 mL/hr at 05/04/21 0230   PRN Meds: acetaminophen **OR** acetaminophen, metoprolol tartrate, morphine injection, ondansetron (ZOFRAN) IV, oxyCODONE, phenol, promethazine (PHENERGAN) injection (IM or IVPB)   Vital Signs    Vitals:   05/04/21 1927 05/05/21 0006 05/05/21 0340 05/05/21 0800  BP: 90/70 107/62 101/78 121/74  Pulse: 77 71 74 87  Resp: '17 19 19 18  '$ Temp: 97.7 F (36.5 C) 98.2 F (36.8 C) 98.1 F (36.7 C) 98.6 F (37 C)  TempSrc: Oral Oral Oral Oral  SpO2: 94% 92% 95% 95%  Weight:      Height:        Intake/Output Summary (Last 24 hours) at 05/05/2021 1003 Last data filed at 05/05/2021 0726 Gross per 24 hour  Intake 3561.01 ml  Output 800 ml  Net 2761.01 ml   Last 3 Weights 05/02/2021 10/18/2018 10/08/2017  Weight (lbs) 170 lb 174 lb 3.2 oz 179 lb  Weight (kg) 77.111 kg 79.017 kg 81.194 kg      Telemetry    1 episode of atrial fibrillation last night, currently in NSR with PVCs - Personally Reviewed  ECG    Atrial fibrillation with RVR - Personally Reviewed  Physical Exam   GEN: No acute distress.  NG tube in placed Neck: No JVD Cardiac: RRR, no murmurs, rubs, or gallops.  Respiratory: Clear to auscultation  bilaterally. GI: Soft, nontender, non-distended  MS: No edema; No deformity. Neuro:  Nonfocal  Psych: Normal affect   Labs    High Sensitivity Troponin:   Recent Labs  Lab 05/02/21 1806 05/02/21 2007  TROPONINIHS 17 20*      Chemistry Recent Labs  Lab 05/02/21 1806 05/02/21 1814 05/03/21 0150 05/04/21 0540 05/05/21 0152  NA 132*   < > 135 136 135  K 4.1   < > 3.7 3.2* 3.2*  CL 96*   < > 99 96* 96*  CO2 24  --  '27 29 30  '$ GLUCOSE 124*   < > 126* 108* 98  BUN 21   < > 18 27* 39*  CREATININE 1.03   < > 1.09 1.06 1.17  CALCIUM 10.3  --  8.8* 8.8* 8.6*  PROT 8.0  --   --   --   --   ALBUMIN 4.1  --   --   --   --   AST 31  --   --   --   --   ALT 20  --   --   --   --   ALKPHOS 63  --   --   --   --   BILITOT 1.3*  --   --   --   --  GFRNONAA >60  --  >60 >60 60*  ANIONGAP 12  --  '9 11 9   '$ < > = values in this interval not displayed.     Hematology Recent Labs  Lab 05/02/21 1806 05/02/21 1814 05/03/21 0150 05/04/21 0540  WBC 18.0*  --  12.3* 6.2  RBC 5.31  --  4.10* 4.45  HGB 16.8 17.0 13.0 13.9  HCT 49.2 50.0 38.6* 41.1  MCV 92.7  --  94.1 92.4  MCH 31.6  --  31.7 31.2  MCHC 34.1  --  33.7 33.8  RDW 12.9  --  13.0 13.0  PLT 226  --  179 159    BNPNo results for input(s): BNP, PROBNP in the last 168 hours.   DDimer No results for input(s): DDIMER in the last 168 hours.   Radiology    DG CHEST PORT 1 VIEW  Result Date: 05/03/2021 CLINICAL DATA:  NG tube placement EXAM: PORTABLE CHEST 1 VIEW COMPARISON:  05/02/2021 FINDINGS: NG tube tip is in the fundus of the stomach. No confluent airspace opacities. Heart is normal size. No effusions. IMPRESSION: NG tube tip in the fundus of the stomach. No acute cardiopulmonary disease. Electronically Signed   By: Rolm Baptise M.D.   On: 05/03/2021 18:38   DG Abd Portable 1V  Result Date: 05/05/2021 CLINICAL DATA:  87 year old male with right inguinal hernia related SBO. EXAM: PORTABLE ABDOMEN - 1 VIEW COMPARISON:   CT Abdomen and Pelvis 05/02/2021.  KUB 05/03/2021. FINDINGS: Portable AP supine view at 0912 hours. Enteric tube placed, side hole at the level of the GE J. Decompressed stomach compared to 05/03/2021. But small-bowel obstruction gas pattern persists. Gas-filled small bowel loops continue to measure up to 40 mm in the central abdomen. Paucity of large bowel gas as before. Stable visualized osseous structures. IMPRESSION: 1. Enteric tube placed, side hole at the level of the GEJ. Advance 5 cm to ensure side hole placement within the stomach. 2. Continued small bowel obstruction gas pattern, not improved since 05/02/2021. Electronically Signed   By: Genevie Ann M.D.   On: 05/05/2021 10:00   ECHOCARDIOGRAM COMPLETE  Result Date: 05/04/2021    ECHOCARDIOGRAM REPORT   Patient Name:   [facility] Date of Exam: 05/04/2021 Medical Rec #:  [id number]          Height:       71.0 in Accession #:    [id number]         Weight:       170.0 lb Date of Birth:  1930/11/28           BSA:          1.968 m Patient Age:    87 years           BP:           106/68 mmHg Patient Gender: M                  HR:           83 bpm. Exam Location:  Inpatient Procedure: 2D Echo, Cardiac Doppler, Color Doppler and 3D Echo Indications:    I48.91* Unspeicified atrial fibrillation  History:        Patient has prior history of Echocardiogram examinations, most                 recent 08/16/2017.  Sonographer:    Merrie Roof RDCS Referring Phys: [id number] [facility]  Sonographer Comments: Technically difficult study  due to poor echo windows. IMPRESSIONS  1. Left ventricular ejection fraction, by estimation, is 60 to 65%. The left ventricle has normal function. The left ventricle has no regional wall motion abnormalities. There is mild left ventricular hypertrophy. Left ventricular diastolic parameters are indeterminate.  2. Right ventricular systolic function is normal. The right ventricular size is normal. Tricuspid regurgitation signal is  inadequate for assessing PA pressure.  3. The mitral valve is degenerative. Trivial mitral valve regurgitation. No evidence of mitral stenosis.  4. The aortic valve is grossly normal. There is mild thickening of the aortic valve. Aortic valve regurgitation is trivial. No aortic stenosis is present.  5. The inferior vena cava is dilated in size with >50% respiratory variability, suggesting right atrial pressure of 8 mmHg. FINDINGS  Left Ventricle: Left ventricular ejection fraction, by estimation, is 60 to 65%. The left ventricle has normal function. The left ventricle has no regional wall motion abnormalities. 3D left ventricular ejection fraction analysis performed but not reported based on interpreter judgement due to suboptimal quality. The left ventricular internal cavity size was normal in size. There is mild left ventricular hypertrophy. Left ventricular diastolic parameters are indeterminate. Right Ventricle: The right ventricular size is normal. No increase in right ventricular wall thickness. Right ventricular systolic function is normal. Tricuspid regurgitation signal is inadequate for assessing PA pressure. Left Atrium: Left atrial size was normal in size. Right Atrium: Right atrial size was normal in size. Pericardium: There is no evidence of pericardial effusion. Mitral Valve: The mitral valve is degenerative in appearance. Mild mitral annular calcification. Trivial mitral valve regurgitation. No evidence of mitral valve stenosis. Tricuspid Valve: The tricuspid valve is normal in structure. Tricuspid valve regurgitation is mild . No evidence of tricuspid stenosis. Aortic Valve: The aortic valve is grossly normal. There is mild thickening of the aortic valve. Aortic valve regurgitation is trivial. No aortic stenosis is present. Aortic valve mean gradient measures 4.0 mmHg. Aortic valve peak gradient measures 7.3 mmHg. Aortic valve area, by VTI measures 3.09 cm. Pulmonic Valve: The pulmonic valve was  normal in structure. Pulmonic valve regurgitation is not visualized. No evidence of pulmonic stenosis. Aorta: The aortic root is normal in size and structure. Venous: The inferior vena cava is dilated in size with greater than 50% respiratory variability, suggesting right atrial pressure of 8 mmHg. IAS/Shunts: The interatrial septum was not well visualized.  LEFT VENTRICLE PLAX 2D LVIDd:         3.90 cm LVIDs:         2.70 cm LV PW:         0.90 cm LV IVS:        1.10 cm LVOT diam:     2.00 cm  3D Volume EF: LV SV:         74       3D EF:        69 % LV SV Index:   37 LVOT Area:     3.14 cm  RIGHT VENTRICLE RV Basal diam:  3.20 cm LEFT ATRIUM             Index       RIGHT ATRIUM           Index LA diam:        3.30 cm 1.68 cm/m  RA Area:     17.20 cm LA Vol (A2C):   53.2 ml 27.03 ml/m RA Volume:   41.90 ml  21.29 ml/m LA Vol (A4C):  48.3 ml 24.54 ml/m LA Biplane Vol: 52.0 ml 26.43 ml/m  AORTIC VALVE AV Area (Vmax):    3.00 cm AV Area (Vmean):   3.03 cm AV Area (VTI):     3.09 cm AV Vmax:           135.00 cm/s AV Vmean:          92.900 cm/s AV VTI:            0.238 m AV Peak Grad:      7.3 mmHg AV Mean Grad:      4.0 mmHg LVOT Vmax:         129.00 cm/s LVOT Vmean:        89.700 cm/s LVOT VTI:          0.234 m LVOT/AV VTI ratio: 0.98  AORTA Ao Root diam: 3.30 cm Ao Asc diam:  3.63 cm  SHUNTS Systemic VTI:  0.23 m Systemic Diam: 2.00 cm Cherlynn Kaiser MD Electronically signed by Cherlynn Kaiser MD Signature Date/Time: 05/04/2021/1:17:47 PM    Final     Cardiac Studies   Echo 05/04/2021  1. Left ventricular ejection fraction, by estimation, is 60 to 65%. The  left ventricle has normal function. The left ventricle has no regional  wall motion abnormalities. There is mild left ventricular hypertrophy.  Left ventricular diastolic parameters  are indeterminate.   2. Right ventricular systolic function is normal. The right ventricular  size is normal. Tricuspid regurgitation signal is inadequate for  assessing  PA pressure.   3. The mitral valve is degenerative. Trivial mitral valve regurgitation.  No evidence of mitral stenosis.   4. The aortic valve is grossly normal. There is mild thickening of the  aortic valve. Aortic valve regurgitation is trivial. No aortic stenosis is  present.   5. The inferior vena cava is dilated in size with >50% respiratory  variability, suggesting right atrial pressure of 8 mmHg.   Patient Profile     87 y.o. male with PMH of SVT, hernia repair, appendectomy, and prior SBO presented for abdominal pain, N/V and painful hernia. Found to be in afib. Underwent open R inguinal hernia repair with mesh on 05/03/2021.  Assessment & Plan    Atrial fibrillation with RVR   - in and out of afib during this admission.  - currently in sinus rhythm  - on IV diltiazem and metoprolol tartrate per tube. - echo 05/04/2021 EF 60-65%, no RWMA, trivial MR.  - continue on current therapy, plan to transition to PO metoprolol once able to tolerate. Will transition lovenox to PO Eliquis once able to take oral meds.   R inguinal hernia: underwent repair 05/03/2021 for incarcerated hernia  SBO: NGT, await bowel function      For questions or updates, please contact [place] Please consult [email or web address] for contact info under        Signed, Almyra Deforest, [place]  05/05/2021, 10:03 AM     Personally seen and examined. Agree with APP above with the following comments: Briefly 87 yo M with a history of AF three years prior after an arm fracture who presents with post operative AF Patient notes no symptoms; notes that his wife has AF and knows the DOAC cost will be a bit expensive as outpatient but doesn't want to have a stroke Exam notable for no active BS, regular rate and rhythm, slight abdominal tenderness, NG tube in place Labs notable for Low K; stable Hgb on lovenox (8/6-8/7 Personally reviewed relevant tests;  presently in SR with frequent PVCs Would recommend  - continue  lovenox, diltiazem and metoprolol presently - wit bowel recovery will transition to PO diltiazem and if needed additional metoprolol  Rudean Haskell, MD [place]  [address] [place], [place] [address] [phone]  11:54 AM

## 2021-05-05 NOTE — Progress Notes (Signed)
3 Days Post-Op  Subjective: Looks well, but still with no bowel function.  Left inguinal hernia is soft.  Still with high NGT output.  Mobilized twice yesterday.  ROS: See above, otherwise other systems negative  Objective: Vital signs in last 24 hours: Temp:  [97.7 F (36.5 C)-98.8 F (37.1 C)] 98.1 F (36.7 C) (08/08 0340) Pulse Rate:  [71-78] 74 (08/08 0340) Resp:  [15-19] 19 (08/08 0340) BP: (90-107)/(62-78) 101/78 (08/08 0340) SpO2:  [92 %-96 %] 95 % (08/08 0340)    Intake/Output from previous day: 08/07 0701 - 08/08 0700 In: 3561 [I.V.:2699.5; IV Piggyback:861.5] Out: 6720 [Urine:225; Emesis/NG output:1350] Intake/Output this shift: Total I/O In: -  Out: 350 [Urine:350]  PE: Abd: slight increase in distention as NGT has been clamped for 2.5 hrs for oral meds.  Returned to suction with bilious output. Left inguinal hernia reducible, but large.  Incision is c/d/i  Lab Results:  Recent Labs    05/03/21 0150 05/04/21 0540  WBC 12.3* 6.2  HGB 13.0 13.9  HCT 38.6* 41.1  PLT 179 159   BMET Recent Labs    05/04/21 0540 05/05/21 0152  NA 136 135  K 3.2* 3.2*  CL 96* 96*  CO2 29 30  GLUCOSE 108* 98  BUN 27* 39*  CREATININE 1.06 1.17  CALCIUM 8.8* 8.6*   PT/INR No results for input(s): LABPROT, INR in the last 72 hours. CMP     Component Value Date/Time   NA 135 05/05/2021 0152   K 3.2 (L) 05/05/2021 0152   CL 96 (L) 05/05/2021 0152   CO2 30 05/05/2021 0152   GLUCOSE 98 05/05/2021 0152   BUN 39 (H) 05/05/2021 0152   CREATININE 1.17 05/05/2021 0152   CALCIUM 8.6 (L) 05/05/2021 0152   PROT 8.0 05/02/2021 1806   ALBUMIN 4.1 05/02/2021 1806   AST 31 05/02/2021 1806   ALT 20 05/02/2021 1806   ALKPHOS 63 05/02/2021 1806   BILITOT 1.3 (H) 05/02/2021 1806   GFRNONAA 60 (L) 05/05/2021 0152   GFRAA  08/14/2007 0312    >60        The eGFR has been calculated using the MDRD equation. This calculation has not been validated in all clinical   Lipase      Component Value Date/Time   LIPASE 27 05/02/2021 1806       Studies/Results: DG CHEST PORT 1 VIEW  Result Date: 05/03/2021 CLINICAL DATA:  NG tube placement EXAM: PORTABLE CHEST 1 VIEW COMPARISON:  05/02/2021 FINDINGS: NG tube tip is in the fundus of the stomach. No confluent airspace opacities. Heart is normal size. No effusions. IMPRESSION: NG tube tip in the fundus of the stomach. No acute cardiopulmonary disease. Electronically Signed   By: Rolm Baptise M.D.   On: 05/03/2021 18:38   ECHOCARDIOGRAM COMPLETE  Result Date: 05/04/2021    ECHOCARDIOGRAM REPORT   Patient Name:   Gary Rice Date of Exam: 05/04/2021 Medical Rec #:  [id number]          Height:       71.0 in Accession #:    [id number]         Weight:       170.0 lb Date of Birth:  Aug 03, 1931           BSA:          1.968 m Patient Age:    87 years           BP:  106/68 mmHg Patient Gender: M                  HR:           83 bpm. Exam Location:  Inpatient Procedure: 2D Echo, Cardiac Doppler, Color Doppler and 3D Echo Indications:    I48.91* Unspeicified atrial fibrillation  History:        Patient has prior history of Echocardiogram examinations, most                 recent 08/16/2017.  Sonographer:    Merrie Roof RDCS Referring Phys: [id number] [facility]  Sonographer Comments: Technically difficult study due to poor echo windows. IMPRESSIONS  1. Left ventricular ejection fraction, by estimation, is 60 to 65%. The left ventricle has normal function. The left ventricle has no regional wall motion abnormalities. There is mild left ventricular hypertrophy. Left ventricular diastolic parameters are indeterminate.  2. Right ventricular systolic function is normal. The right ventricular size is normal. Tricuspid regurgitation signal is inadequate for assessing PA pressure.  3. The mitral valve is degenerative. Trivial mitral valve regurgitation. No evidence of mitral stenosis.  4. The aortic valve is grossly normal. There is  mild thickening of the aortic valve. Aortic valve regurgitation is trivial. No aortic stenosis is present.  5. The inferior vena cava is dilated in size with >50% respiratory variability, suggesting right atrial pressure of 8 mmHg. FINDINGS  Left Ventricle: Left ventricular ejection fraction, by estimation, is 60 to 65%. The left ventricle has normal function. The left ventricle has no regional wall motion abnormalities. 3D left ventricular ejection fraction analysis performed but not reported based on interpreter judgement due to suboptimal quality. The left ventricular internal cavity size was normal in size. There is mild left ventricular hypertrophy. Left ventricular diastolic parameters are indeterminate. Right Ventricle: The right ventricular size is normal. No increase in right ventricular wall thickness. Right ventricular systolic function is normal. Tricuspid regurgitation signal is inadequate for assessing PA pressure. Left Atrium: Left atrial size was normal in size. Right Atrium: Right atrial size was normal in size. Pericardium: There is no evidence of pericardial effusion. Mitral Valve: The mitral valve is degenerative in appearance. Mild mitral annular calcification. Trivial mitral valve regurgitation. No evidence of mitral valve stenosis. Tricuspid Valve: The tricuspid valve is normal in structure. Tricuspid valve regurgitation is mild . No evidence of tricuspid stenosis. Aortic Valve: The aortic valve is grossly normal. There is mild thickening of the aortic valve. Aortic valve regurgitation is trivial. No aortic stenosis is present. Aortic valve mean gradient measures 4.0 mmHg. Aortic valve peak gradient measures 7.3 mmHg. Aortic valve area, by VTI measures 3.09 cm. Pulmonic Valve: The pulmonic valve was normal in structure. Pulmonic valve regurgitation is not visualized. No evidence of pulmonic stenosis. Aorta: The aortic root is normal in size and structure. Venous: The inferior vena cava is  dilated in size with greater than 50% respiratory variability, suggesting right atrial pressure of 8 mmHg. IAS/Shunts: The interatrial septum was not well visualized.  LEFT VENTRICLE PLAX 2D LVIDd:         3.90 cm LVIDs:         2.70 cm LV PW:         0.90 cm LV IVS:        1.10 cm LVOT diam:     2.00 cm  3D Volume EF: LV SV:         74       3D  EF:        69 % LV SV Index:   37 LVOT Area:     3.14 cm  RIGHT VENTRICLE RV Basal diam:  3.20 cm LEFT ATRIUM             Index       RIGHT ATRIUM           Index LA diam:        3.30 cm 1.68 cm/m  RA Area:     17.20 cm LA Vol (A2C):   53.2 ml 27.03 ml/m RA Volume:   41.90 ml  21.29 ml/m LA Vol (A4C):   48.3 ml 24.54 ml/m LA Biplane Vol: 52.0 ml 26.43 ml/m  AORTIC VALVE AV Area (Vmax):    3.00 cm AV Area (Vmean):   3.03 cm AV Area (VTI):     3.09 cm AV Vmax:           135.00 cm/s AV Vmean:          92.900 cm/s AV VTI:            0.238 m AV Peak Grad:      7.3 mmHg AV Mean Grad:      4.0 mmHg LVOT Vmax:         129.00 cm/s LVOT Vmean:        89.700 cm/s LVOT VTI:          0.234 m LVOT/AV VTI ratio: 0.98  AORTA Ao Root diam: 3.30 cm Ao Asc diam:  3.63 cm  SHUNTS Systemic VTI:  0.23 m Systemic Diam: 2.00 cm Cherlynn Kaiser MD Electronically signed by Cherlynn Kaiser MD Signature Date/Time: 05/04/2021/1:17:47 PM    Final     Anti-infectives: Anti-infectives (From admission, onward)    None        Assessment/Plan POD 3, s/p open right inguinal hernia repair with mesh by Dr. Bobbye Morton for incarcerated RIH with BO -ileus, cont NGT and await bowel function, but will check plain film today to evaluate ileus pattern vs obstructive patter.  Usually hernia repairs do not have such significant delay in bowel function given he has no bowel resection etc. -mobilize as patient states he did not get out of bed at all yesterday -pulm toilet/IS -pain control, multi-modal  -due to high output from NGT, d/w primary that oral meds are likely not being absorbed and would  likely be best IV as able.  FEN - NPO/NGT/IVFs/ replace K again today due to still 3.2 VTE - Lovenox ID - none  New onset a fib Ascending thoracic aneurysm   LOS: 3 days    Henreitta Cea , [facility] Surgery 05/05/2021, 8:36 AM Please see Amion for pager number during day hours 7:00am-4:30pm or 7:00am -11:30am on weekends

## 2021-05-05 NOTE — TOC Benefit Eligibility Note (Signed)
Patient Advocate Encounter  Insurance verification completed.    The patient is currently admitted and upon discharge could be taking Eliquis 5 mg.  The current 30 day co-pay is, $47.00.   The patient is insured through [employer] Medicare Part D    Ingvald Theisen, CPhT Pharmacy Patient Advocate Specialist [place] [facility] Team Direct Number: [phone]  Fax: [phone]        

## 2021-05-06 ENCOUNTER — Inpatient Hospital Stay (HOSPITAL_COMMUNITY): Payer: Medicare Other

## 2021-05-06 DIAGNOSIS — I4891 Unspecified atrial fibrillation: Secondary | ICD-10-CM | POA: Diagnosis not present

## 2021-05-06 LAB — BASIC METABOLIC PANEL
Anion gap: 3 — ABNORMAL LOW (ref 5–15)
BUN: 24 mg/dL — ABNORMAL HIGH (ref 8–23)
CO2: 27 mmol/L (ref 22–32)
Calcium: 7.7 mg/dL — ABNORMAL LOW (ref 8.9–10.3)
Chloride: 104 mmol/L (ref 98–111)
Creatinine, Ser: 0.93 mg/dL (ref 0.61–1.24)
GFR, Estimated: >60 mL/min (ref >60)
Glucose, Bld: 88 mg/dL (ref 70–99)
Potassium: 3.4 mmol/L — ABNORMAL LOW (ref 3.5–5.1)
Sodium: 134 mmol/L — ABNORMAL LOW (ref 135–145)

## 2021-05-06 LAB — CBC
HCT: 43 % (ref 39.0–52.0)
Hemoglobin: 14.5 g/dL (ref 13.0–17.0)
MCH: 31.3 pg (ref 26.0–34.0)
MCHC: 33.7 g/dL (ref 30.0–36.0)
MCV: 92.9 fL (ref 80.0–100.0)
Platelets: 180 10*3/uL (ref 150–400)
RBC: 4.63 MIL/uL (ref 4.22–5.81)
RDW: 12.7 % (ref 11.5–15.5)
WBC: 6.9 10*3/uL (ref 4.0–10.5)
nRBC: 0 % (ref 0.0–0.2)

## 2021-05-06 LAB — MAGNESIUM: Magnesium: 1.9 mg/dL (ref 1.7–2.4)

## 2021-05-06 MED ORDER — POTASSIUM CHLORIDE 10 MEQ/100ML IV SOLN
10.0000 meq | INTRAVENOUS | Status: AC
  Administered 2021-05-06 (×6): 10 meq via INTRAVENOUS
  Filled 2021-05-06 (×6): qty 100

## 2021-05-06 MED ORDER — PANTOPRAZOLE SODIUM 40 MG IV SOLR
40.0000 mg | INTRAVENOUS | Status: DC
  Administered 2021-05-06 – 2021-05-26 (×20): 40 mg via INTRAVENOUS
  Filled 2021-05-06 (×20): qty 40

## 2021-05-06 MED ORDER — METHOCARBAMOL 1000 MG/10ML IJ SOLN
1000.0000 mg | Freq: Three times a day (TID) | INTRAVENOUS | Status: DC | PRN
  Filled 2021-05-06 (×2): qty 10

## 2021-05-06 NOTE — Progress Notes (Signed)
4 Days Post-Op  Subjective: Had a large BM overnight and some gas.  Unfortunately had his NGT clamped and given clear liquids overnight as well.  This morning had 400-500cc of residual and much more distended.  ROS: See above, otherwise other systems negative  Objective: Vital signs in last 24 hours: Temp:  [97.7 F (36.5 C)-99.2 F (37.3 C)] 99.2 F (37.3 C) (08/09 0439) Pulse Rate:  [74-95] 87 (08/09 0439) Resp:  [18-20] 18 (08/09 0439) BP: (104-127)/(58-87) 127/81 (08/09 0439) SpO2:  [93 %-95 %] 93 % (08/09 0439) Last BM Date: 05/05/21  Intake/Output from previous day: 08/08 0701 - 08/09 0700 In: 1276.5 [I.V.:676.5; IV Piggyback:600] Out: 1750 [Urine:950; Emesis/NG output:800] Intake/Output this shift: Total I/O In: -  Out: 1100 [Emesis/NG output:1100]  PE: Abd: more distention today with NGT clamped.  Around 500cc of bilious residual when NGT returned to [facility].  Incision is c/d/I.  LIH soft and reducible  Lab Results:  Recent Labs    05/04/21 0540 05/06/21 0451  WBC 6.2 6.9  HGB 13.9 14.5  HCT 41.1 43.0  PLT 159 180   BMET Recent Labs    05/05/21 0152 05/06/21 0451  NA 135 134*  K 3.2* 3.4*  CL 96* 104  CO2 30 27  GLUCOSE 98 88  BUN 39* 24*  CREATININE 1.17 0.93  CALCIUM 8.6* 7.7*   PT/INR No results for input(s): LABPROT, INR in the last 72 hours. CMP     Component Value Date/Time   NA 134 (L) 05/06/2021 0451   K 3.4 (L) 05/06/2021 0451   CL 104 05/06/2021 0451   CO2 27 05/06/2021 0451   GLUCOSE 88 05/06/2021 0451   BUN 24 (H) 05/06/2021 0451   CREATININE 0.93 05/06/2021 0451   CALCIUM 7.7 (L) 05/06/2021 0451   PROT 8.0 05/02/2021 1806   ALBUMIN 4.1 05/02/2021 1806   AST 31 05/02/2021 1806   ALT 20 05/02/2021 1806   ALKPHOS 63 05/02/2021 1806   BILITOT 1.3 (H) 05/02/2021 1806   GFRNONAA >60 05/06/2021 0451   GFRAA  08/14/2007 0312    >60        The eGFR has been calculated using the MDRD equation. This calculation has not  been validated in all clinical   Lipase     Component Value Date/Time   LIPASE 27 05/02/2021 1806       Studies/Results: CT ABDOMEN PELVIS WO CONTRAST  Result Date: 05/05/2021 CLINICAL DATA:  Bowel obstruction suspected EXAM: CT ABDOMEN AND PELVIS WITHOUT CONTRAST TECHNIQUE: Multidetector CT imaging of the abdomen and pelvis was performed following the standard protocol without IV contrast. COMPARISON:  CT 05/02/2021 FINDINGS: Lower chest: Partially visualized ascending aorta measures 4.4 cm, this measured up to 4.8 cm on prior dedicated CTA of the chest. Small right and trace left pleural effusions with adjacent right basilar atelectasis. Coronary artery calcifications. Hepatobiliary: Atrophic left hepatic lobe. Layering high density in the gallbladder, likely hyperdense sludge and small stones. Pancreas: Unremarkable. No pancreatic ductal dilatation or surrounding inflammatory changes. Spleen: Normal in size without focal abnormality. Adrenals/Urinary Tract: Adrenal glands are unremarkable. No hydronephrosis. Unchanged left renal cysts. Unchanged right renal cyst. No nephrolithiasis. Unchanged bladder dome diverticulum. Stomach/Bowel: There is a nasogastric tube with tip in the stomach. The stomach is unremarkable. There are multiple dilated loops of small bowel, with transition point in the left lower quadrant anteriorly (series 3, image 58 and series 6, image 36). There is a large rectal stool ball. Vascular/Lymphatic: Aortoiliac atherosclerotic  calcifications. No AAA. Reproductive: Enlarged prostate. Other: There is a left inguinal hernia containing small bowel. Postsurgical changes of recent open right inguinal hernia repair. There is no evidence of residual bowel in the right inguinal canal. There are postsurgical inflammatory changes in this region and small foci of gas. Musculoskeletal: Prior right hip arthroplasty. No acute osseous abnormality. Dextroconvex curvature of the lumbar spine.  Multilevel degenerative changes of the spine, no acute osseous abnormality. No suspicious lytic or blastic lesions. IMPRESSION: Persistent small bowel obstruction, with transition point in the left lower quadrant anteriorly, possibly due to adhesions. Postsurgical changes of recent open right inguinal hernia repair without recurrence. Unchanged small bowel containing left inguinal hernia, which appears unrelated to the current bowel obstruction. Unchanged small right and trace left pleural effusions. Unchanged layering high density in the gallbladder, likely hyperdense sludge and small stones. Electronically Signed   By: Maurine Simmering   On: 05/05/2021 16:17   DG Abd Portable 1V  Result Date: 05/05/2021 CLINICAL DATA:  87 year old male with right inguinal hernia related SBO. EXAM: PORTABLE ABDOMEN - 1 VIEW COMPARISON:  CT Abdomen and Pelvis 05/02/2021.  KUB 05/03/2021. FINDINGS: Portable AP supine view at 0912 hours. Enteric tube placed, side hole at the level of the GE J. Decompressed stomach compared to 05/03/2021. But small-bowel obstruction gas pattern persists. Gas-filled small bowel loops continue to measure up to 40 mm in the central abdomen. Paucity of large bowel gas as before. Stable visualized osseous structures. IMPRESSION: 1. Enteric tube placed, side hole at the level of the GEJ. Advance 5 cm to ensure side hole placement within the stomach. 2. Continued small bowel obstruction gas pattern, not improved since 05/02/2021. Electronically Signed   By: Genevie Ann M.D.   On: 05/05/2021 10:00   ECHOCARDIOGRAM COMPLETE  Result Date: 05/04/2021    ECHOCARDIOGRAM REPORT   Patient Name:   [facility] Date of Exam: 05/04/2021 Medical Rec #:  [id number]          Height:       71.0 in Accession #:    [id number]         Weight:       170.0 lb Date of Birth:  [birth year]           BSA:          1.968 m Patient Age:    87 years           BP:           106/68 mmHg Patient Gender: M                  HR:           83  bpm. Exam Location:  Inpatient Procedure: 2D Echo, Cardiac Doppler, Color Doppler and 3D Echo Indications:    I48.91* Unspeicified atrial fibrillation  History:        Patient has prior history of Echocardiogram examinations, most                 recent 08/16/2017.  Sonographer:    Merrie Roof RDCS Referring Phys: [id number] [facility]  Sonographer Comments: Technically difficult study due to poor echo windows. IMPRESSIONS  1. Left ventricular ejection fraction, by estimation, is 60 to 65%. The left ventricle has normal function. The left ventricle has no regional wall motion abnormalities. There is mild left ventricular hypertrophy. Left ventricular diastolic parameters are indeterminate.  2. Right ventricular systolic function is normal. The right ventricular size is  normal. Tricuspid regurgitation signal is inadequate for assessing PA pressure.  3. The mitral valve is degenerative. Trivial mitral valve regurgitation. No evidence of mitral stenosis.  4. The aortic valve is grossly normal. There is mild thickening of the aortic valve. Aortic valve regurgitation is trivial. No aortic stenosis is present.  5. The inferior vena cava is dilated in size with >50% respiratory variability, suggesting right atrial pressure of 8 mmHg. FINDINGS  Left Ventricle: Left ventricular ejection fraction, by estimation, is 60 to 65%. The left ventricle has normal function. The left ventricle has no regional wall motion abnormalities. 3D left ventricular ejection fraction analysis performed but not reported based on interpreter judgement due to suboptimal quality. The left ventricular internal cavity size was normal in size. There is mild left ventricular hypertrophy. Left ventricular diastolic parameters are indeterminate. Right Ventricle: The right ventricular size is normal. No increase in right ventricular wall thickness. Right ventricular systolic function is normal. Tricuspid regurgitation signal is inadequate for assessing  PA pressure. Left Atrium: Left atrial size was normal in size. Right Atrium: Right atrial size was normal in size. Pericardium: There is no evidence of pericardial effusion. Mitral Valve: The mitral valve is degenerative in appearance. Mild mitral annular calcification. Trivial mitral valve regurgitation. No evidence of mitral valve stenosis. Tricuspid Valve: The tricuspid valve is normal in structure. Tricuspid valve regurgitation is mild . No evidence of tricuspid stenosis. Aortic Valve: The aortic valve is grossly normal. There is mild thickening of the aortic valve. Aortic valve regurgitation is trivial. No aortic stenosis is present. Aortic valve mean gradient measures 4.0 mmHg. Aortic valve peak gradient measures 7.3 mmHg. Aortic valve area, by VTI measures 3.09 cm. Pulmonic Valve: The pulmonic valve was normal in structure. Pulmonic valve regurgitation is not visualized. No evidence of pulmonic stenosis. Aorta: The aortic root is normal in size and structure. Venous: The inferior vena cava is dilated in size with greater than 50% respiratory variability, suggesting right atrial pressure of 8 mmHg. IAS/Shunts: The interatrial septum was not well visualized.  LEFT VENTRICLE PLAX 2D LVIDd:         3.90 cm LVIDs:         2.70 cm LV PW:         0.90 cm LV IVS:        1.10 cm LVOT diam:     2.00 cm  3D Volume EF: LV SV:         74       3D EF:        69 % LV SV Index:   37 LVOT Area:     3.14 cm  RIGHT VENTRICLE RV Basal diam:  3.20 cm LEFT ATRIUM             Index       RIGHT ATRIUM           Index LA diam:        3.30 cm 1.68 cm/m  RA Area:     17.20 cm LA Vol (A2C):   53.2 ml 27.03 ml/m RA Volume:   41.90 ml  21.29 ml/m LA Vol (A4C):   48.3 ml 24.54 ml/m LA Biplane Vol: 52.0 ml 26.43 ml/m  AORTIC VALVE AV Area (Vmax):    3.00 cm AV Area (Vmean):   3.03 cm AV Area (VTI):     3.09 cm AV Vmax:           135.00 cm/s AV Vmean:  92.900 cm/s AV VTI:            0.238 m AV Peak Grad:      7.3 mmHg AV  Mean Grad:      4.0 mmHg LVOT Vmax:         129.00 cm/s LVOT Vmean:        89.700 cm/s LVOT VTI:          0.234 m LVOT/AV VTI ratio: 0.98  AORTA Ao Root diam: 3.30 cm Ao Asc diam:  3.63 cm  SHUNTS Systemic VTI:  0.23 m Systemic Diam: 2.00 cm Cherlynn Kaiser MD Electronically signed by Cherlynn Kaiser MD Signature Date/Time: 05/04/2021/1:17:47 PM    Final     Anti-infectives: Anti-infectives (From admission, onward)    None        Assessment/Plan POD 4, s/p open right inguinal hernia repair with mesh by Dr. Bobbye Morton for incarcerated RIH with BO -ileus vs SBO based on CT scan.  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  He had a large stool burden in his rectum on his CT scan so suspect this was that.  He is still bloated with fairly significant residual with NGT clamped overnight. -return to LIWS -check plain film to follow contrast from yesterday's CT scan.  If this remains in the SB, may require dx lap tomorrow to rule out other etiology of obstruction. -mobilize  -pulm toilet/IS -pain control, multi-modal  -D/w primary service.  Appreciate their assistance  FEN - NPO/NGT/IVFs/ replace K again today due to still 3.4 VTE - Lovenox ID - none  New onset a fib Ascending thoracic aneurysm   LOS: 4 days    Henreitta Cea , [facility] Surgery 05/06/2021, 8:26 AM Please see Amion for pager number during day hours 7:00am-4:30pm or 7:00am -11:30am on weekends

## 2021-05-06 NOTE — Therapy (Signed)
Occupational Therapy Treatment Patient Details Name: Gary Rice MRN: [id number] DOB: Sep 21, 1931 Today's Date: 05/06/2021    History of present illness 87yo male admitted 8/522 after being referred to ED by PCP secondary to incarcerated inguinal hernia causing SBO, also found to be in Afib with RVR in the ED. Received surgical repair of incarcerated inguinal hernia 05/02/21. PMH hernia repair joint replacement   OT comments  Pt seen for OT ADL retraining session and functional mobility w/ focus on bed mobility, toilet transfer, grooming, UB bathe/dressing. Pt is tolerating treatment well and verbalizes understanding of safety and transfers for ADL's. He is Min guard - Mod I for ADL/transfers and he continues to make slow steady gains. Continue towards OT plan of care and goals with goal of Mod I overall.   Follow Up Recommendations  Home health OT;Supervision - Intermittent    Equipment Recommendations  Other (comment) (RW)    Recommendations for Other Services      Precautions / Restrictions Precautions Precautions: Fall Precaution Comments: s/p inguinal hernia repair, NG tube, watch SPO2 and HR       Mobility Bed Mobility Overal bed mobility: Needs Assistance Bed Mobility: Supine to Sit     Supine to sit: Supervision;Min guard;HOB elevated     General bed mobility comments: Increased effort, pt wants to do it himself    Transfers Overall transfer level: Needs assistance Equipment used: Rolling walker (2 wheeled) Transfers: Sit to/from [facility] Sit to Stand: Supervision Stand pivot transfers: Min assist            Balance Overall balance assessment: Modified Independent Sitting-balance support: Feet supported Sitting balance-Leahy Scale: Good     Standing balance support: Bilateral upper extremity supported;During functional activity Standing balance-Leahy Scale: Good Standing balance comment: No external assist required, usinf RW for  support.          ADL either performed or assessed with clinical judgement   ADL Overall ADL's : Needs assistance/impaired Eating/Feeding: NPO   Grooming: Set up;Modified independent;Sitting;Wash/dry hands;Wash/dry face;Oral care (Functional mobiilty from bed to chair. Completed ADL's sitting up in chair.)   Upper Body Bathing: Set up;Sitting       Upper Body Dressing : Modified independent;Set up;Sitting (Sitting up at EOB (required RN assist to unconnect and reconnect IV while changing hospital gown).)       Toilet Transfer: Min guard;Ambulation;RW (Simulated toilet transfer from bed to recliner chair today. No hands on physical assist)   Toileting- Clothing Manipulation and Hygiene: Sitting/lateral lean;Modified independent;Supervision/safety         General ADL Comments: Pt performing bed mobility and functional transfers related to ADL's w/ overall min guard assist. Grooming with set-up, UB dressing Mod I/set-up. Pt tolerating OOB activity well.     Cognition Arousal/Alertness: Awake/alert Behavior During Therapy: WFL for tasks assessed/performed Overall Cognitive Status: Within Functional Limits for tasks assessed     General Comments: pleasant throughout         Pertinent Vitals/ Pain       Pain Assessment: 0-10 Pain Score: 0-No pain         Frequency  Min 2X/week        Progress Toward Goals  OT Goals(current goals can now be found in the care plan section)  Progress towards OT goals: Progressing toward goals  Acute Rehab OT Goals Patient Stated Goal: To go home OT Goal Formulation: With patient Time For Goal Achievement: 05/18/21 Potential to Achieve Goals: Good  Plan Discharge plan remains appropriate;Frequency  remains appropriate       AM-PAC OT "6 Clicks" Daily Activity     Outcome Measure   Help from another person eating meals?: None (NPO - pt will be able to feed himself) Help from another person taking care of personal grooming?:  A Little Help from another person toileting, which includes using toliet, bedpan, or urinal?: A Little Help from another person bathing (including washing, rinsing, drying)?: A Little Help from another person to put on and taking off regular upper body clothing?: A Little Help from another person to put on and taking off regular lower body clothing?: A Little 6 Click Score: 19    End of Session Equipment Utilized During Treatment: Rolling walker  OT Visit Diagnosis: Unsteadiness on feet (R26.81);Other abnormalities of gait and mobility (R26.89);Muscle weakness (generalized) (M62.81)   Activity Tolerance Patient tolerated treatment well   Patient Left in chair;with call bell/phone within reach;with chair alarm set   Nurse Communication Mobility status        Time: [id number] OT Time Calculation (min): 36 min  Charges: OT General Charges $OT Visit: 1 Visit OT Treatments $Self Care/Home Management : 8-22 mins $Therapeutic Activity: 8-22 mins   Ajna Moors Beth Dixon, OTR/L 05/06/2021, 12:12 PM

## 2021-05-06 NOTE — Progress Notes (Signed)
SLP Cancellation Note  Patient Details Name: Gary Rice MRN: [id number] DOB: January 17, 1931   Cancelled treatment:       Reason Eval/Treat Not Completed: Patient not medically ready. Pt continues to remain NPO from a surgical standpoint, requiring NGT to LIWS. SLP will s/o for now, but please reorder if swallow eval is still indicated when he is able to return to PO diet.     Osie Bond., M.A. [facility] Pager [phone] Office [phone]  05/06/2021, 9:01 AM

## 2021-05-06 NOTE — Progress Notes (Addendum)
Progress Note  Patient Name: Gary Rice [facility] Date of Encounter: 05/06/2021  [facility] Cardiologist: Candee Furbish, MD   Subjective   Patient states he is feeling well, denied any chest pain, abdominal pain, SOB, dizziness. He states he has not been experiencing heart palpitation.   Inpatient Medications    Scheduled Meds:  acetaminophen (TYLENOL) oral liquid 160 mg/5 mL  1,000 mg Per Tube Q6H   enoxaparin (LOVENOX) injection  40 mg Subcutaneous Q24H   ketorolac  15 mg Intravenous Q6H   pantoprazole (PROTONIX) IV  40 mg Intravenous Q24H   Continuous Infusions:  diltiazem (CARDIZEM) infusion 10 mg/hr (05/05/21 0640)   lactated ringers 70 mL/hr at 05/05/21 0415   methocarbamol (ROBAXIN) IV     potassium chloride 10 mEq (05/06/21 0938)   promethazine (PHENERGAN) injection (IM or IVPB) 70 mL/hr at 05/04/21 0230   PRN Meds: acetaminophen **OR** acetaminophen, methocarbamol (ROBAXIN) IV, metoprolol tartrate, morphine injection, ondansetron (ZOFRAN) IV, phenol, promethazine (PHENERGAN) injection (IM or IVPB)   Vital Signs    Vitals:   05/05/21 2024 05/05/21 2328 05/06/21 0439 05/06/21 0856  BP: 114/61 121/87 127/81 115/76  Pulse: 74 95 87 91  Resp: '18 20 18 20  '$ Temp: 98 F (36.7 C) 98.4 F (36.9 C) 99.2 F (37.3 C) 98.5 F (36.9 C)  TempSrc: Oral Oral Oral Oral  SpO2: 93% 93% 93% 91%  Weight:      Height:        Intake/Output Summary (Last 24 hours) at 05/06/2021 0947 Last data filed at 05/06/2021 0816 Gross per 24 hour  Intake 1276.5 ml  Output 2500 ml  Net -1223.5 ml   Last 3 Weights 05/02/2021 10/18/2018 10/08/2017  Weight (lbs) 170 lb 174 lb 3.2 oz 179 lb  Weight (kg) 77.111 kg 79.017 kg 81.194 kg      Telemetry    Coveted to SR  from A fib RVR overnight around 1920 on 05/05/21, currently 80s, intermittent PACs and  PVCs - Personally Reviewed  ECG    N/A this AM  - Personally Reviewed  Physical Exam   GEN: No acute distress.   Neck: No JVD Cardiac:  RRR, no murmurs, rubs, or gallops.  Respiratory: Clear to auscultation bilaterally. GI: Soft, non-tender. NG to [place], bile/brown colored drainage noted  MS: No BLE edema, SCDs in place; No deformity. Neuro:  Nonfocal  Psych: Normal affect   Labs    High Sensitivity Troponin:   Recent Labs  Lab 05/02/21 1806 05/02/21 2007  TROPONINIHS 17 20*      Chemistry Recent Labs  Lab 05/02/21 1806 05/02/21 1814 05/04/21 0540 05/05/21 0152 05/06/21 0451  NA 132*   < > 136 135 134*  K 4.1   < > 3.2* 3.2* 3.4*  CL 96*   < > 96* 96* 104  CO2 24   < > '29 30 27  '$ GLUCOSE 124*   < > 108* 98 88  BUN 21   < > 27* 39* 24*  CREATININE 1.03   < > 1.06 1.17 0.93  CALCIUM 10.3   < > 8.8* 8.6* 7.7*  PROT 8.0  --   --   --   --   ALBUMIN 4.1  --   --   --   --   AST 31  --   --   --   --   ALT 20  --   --   --   --   ALKPHOS 63  --   --   --   --  BILITOT 1.3*  --   --   --   --   GFRNONAA >60   < > >60 60* >60  ANIONGAP 12   < > 11 9 3*   < > = values in this interval not displayed.     Hematology Recent Labs  Lab 05/03/21 0150 05/04/21 0540 05/06/21 0451  WBC 12.3* 6.2 6.9  RBC 4.10* 4.45 4.63  HGB 13.0 13.9 14.5  HCT 38.6* 41.1 43.0  MCV 94.1 92.4 92.9  MCH 31.7 31.2 31.3  MCHC 33.7 33.8 33.7  RDW 13.0 13.0 12.7  PLT 179 159 180    BNPNo results for input(s): BNP, PROBNP in the last 168 hours.   DDimer No results for input(s): DDIMER in the last 168 hours.   Radiology    CT ABDOMEN PELVIS WO CONTRAST  Result Date: 05/05/2021 CLINICAL DATA:  Bowel obstruction suspected EXAM: CT ABDOMEN AND PELVIS WITHOUT CONTRAST TECHNIQUE: Multidetector CT imaging of the abdomen and pelvis was performed following the standard protocol without IV contrast. COMPARISON:  CT 05/02/2021 FINDINGS: Lower chest: Partially visualized ascending aorta measures 4.4 cm, this measured up to 4.8 cm on prior dedicated CTA of the chest. Small right and trace left pleural effusions with adjacent right  basilar atelectasis. Coronary artery calcifications. Hepatobiliary: Atrophic left hepatic lobe. Layering high density in the gallbladder, likely hyperdense sludge and small stones. Pancreas: Unremarkable. No pancreatic ductal dilatation or surrounding inflammatory changes. Spleen: Normal in size without focal abnormality. Adrenals/Urinary Tract: Adrenal glands are unremarkable. No hydronephrosis. Unchanged left renal cysts. Unchanged right renal cyst. No nephrolithiasis. Unchanged bladder dome diverticulum. Stomach/Bowel: There is a nasogastric tube with tip in the stomach. The stomach is unremarkable. There are multiple dilated loops of small bowel, with transition point in the left lower quadrant anteriorly (series 3, image 58 and series 6, image 36). There is a large rectal stool ball. Vascular/Lymphatic: Aortoiliac atherosclerotic calcifications. No AAA. Reproductive: Enlarged prostate. Other: There is a left inguinal hernia containing small bowel. Postsurgical changes of recent open right inguinal hernia repair. There is no evidence of residual bowel in the right inguinal canal. There are postsurgical inflammatory changes in this region and small foci of gas. Musculoskeletal: Prior right hip arthroplasty. No acute osseous abnormality. Dextroconvex curvature of the lumbar spine. Multilevel degenerative changes of the spine, no acute osseous abnormality. No suspicious lytic or blastic lesions. IMPRESSION: Persistent small bowel obstruction, with transition point in the left lower quadrant anteriorly, possibly due to adhesions. Postsurgical changes of recent open right inguinal hernia repair without recurrence. Unchanged small bowel containing left inguinal hernia, which appears unrelated to the current bowel obstruction. Unchanged small right and trace left pleural effusions. Unchanged layering high density in the gallbladder, likely hyperdense sludge and small stones. Electronically Signed   By: Maurine Simmering    On: 05/05/2021 16:17   DG Abd Portable 1V  Result Date: 05/05/2021 CLINICAL DATA:  87 year old male with right inguinal hernia related SBO. EXAM: PORTABLE ABDOMEN - 1 VIEW COMPARISON:  CT Abdomen and Pelvis 05/02/2021.  KUB 05/03/2021. FINDINGS: Portable AP supine view at 0912 hours. Enteric tube placed, side hole at the level of the GE J. Decompressed stomach compared to 05/03/2021. But small-bowel obstruction gas pattern persists. Gas-filled small bowel loops continue to measure up to 40 mm in the central abdomen. Paucity of large bowel gas as before. Stable visualized osseous structures. IMPRESSION: 1. Enteric tube placed, side hole at the level of the GEJ. Advance 5 cm to ensure  side hole placement within the stomach. 2. Continued small bowel obstruction gas pattern, not improved since 05/02/2021. Electronically Signed   By: Genevie Ann M.D.   On: 05/05/2021 10:00   ECHOCARDIOGRAM COMPLETE  Result Date: 05/04/2021    ECHOCARDIOGRAM REPORT   Patient Name:   [facility] Date of Exam: 05/04/2021 Medical Rec #:  [id number]          Height:       71.0 in Accession #:    [id number]         Weight:       170.0 lb Date of Birth:  11-16-1930           BSA:          1.968 m Patient Age:    37 years           BP:           106/68 mmHg Patient Gender: M                  HR:           83 bpm. Exam Location:  Inpatient Procedure: 2D Echo, Cardiac Doppler, Color Doppler and 3D Echo Indications:    I48.91* Unspeicified atrial fibrillation  History:        Patient has prior history of Echocardiogram examinations, most                 recent 08/16/2017.  Sonographer:    Merrie Roof RDCS Referring Phys: [id number] [facility]  Sonographer Comments: Technically difficult study due to poor echo windows. IMPRESSIONS  1. Left ventricular ejection fraction, by estimation, is 60 to 65%. The left ventricle has normal function. The left ventricle has no regional wall motion abnormalities. There is mild left ventricular  hypertrophy. Left ventricular diastolic parameters are indeterminate.  2. Right ventricular systolic function is normal. The right ventricular size is normal. Tricuspid regurgitation signal is inadequate for assessing PA pressure.  3. The mitral valve is degenerative. Trivial mitral valve regurgitation. No evidence of mitral stenosis.  4. The aortic valve is grossly normal. There is mild thickening of the aortic valve. Aortic valve regurgitation is trivial. No aortic stenosis is present.  5. The inferior vena cava is dilated in size with >50% respiratory variability, suggesting right atrial pressure of 8 mmHg. FINDINGS  Left Ventricle: Left ventricular ejection fraction, by estimation, is 60 to 65%. The left ventricle has normal function. The left ventricle has no regional wall motion abnormalities. 3D left ventricular ejection fraction analysis performed but not reported based on interpreter judgement due to suboptimal quality. The left ventricular internal cavity size was normal in size. There is mild left ventricular hypertrophy. Left ventricular diastolic parameters are indeterminate. Right Ventricle: The right ventricular size is normal. No increase in right ventricular wall thickness. Right ventricular systolic function is normal. Tricuspid regurgitation signal is inadequate for assessing PA pressure. Left Atrium: Left atrial size was normal in size. Right Atrium: Right atrial size was normal in size. Pericardium: There is no evidence of pericardial effusion. Mitral Valve: The mitral valve is degenerative in appearance. Mild mitral annular calcification. Trivial mitral valve regurgitation. No evidence of mitral valve stenosis. Tricuspid Valve: The tricuspid valve is normal in structure. Tricuspid valve regurgitation is mild . No evidence of tricuspid stenosis. Aortic Valve: The aortic valve is grossly normal. There is mild thickening of the aortic valve. Aortic valve regurgitation is trivial. No aortic  stenosis is present. Aortic valve mean gradient  measures 4.0 mmHg. Aortic valve peak gradient measures 7.3 mmHg. Aortic valve area, by VTI measures 3.09 cm. Pulmonic Valve: The pulmonic valve was normal in structure. Pulmonic valve regurgitation is not visualized. No evidence of pulmonic stenosis. Aorta: The aortic root is normal in size and structure. Venous: The inferior vena cava is dilated in size with greater than 50% respiratory variability, suggesting right atrial pressure of 8 mmHg. IAS/Shunts: The interatrial septum was not well visualized.  LEFT VENTRICLE PLAX 2D LVIDd:         3.90 cm LVIDs:         2.70 cm LV PW:         0.90 cm LV IVS:        1.10 cm LVOT diam:     2.00 cm  3D Volume EF: LV SV:         74       3D EF:        69 % LV SV Index:   37 LVOT Area:     3.14 cm  RIGHT VENTRICLE RV Basal diam:  3.20 cm LEFT ATRIUM             Index       RIGHT ATRIUM           Index LA diam:        3.30 cm 1.68 cm/m  RA Area:     17.20 cm LA Vol (A2C):   53.2 ml 27.03 ml/m RA Volume:   41.90 ml  21.29 ml/m LA Vol (A4C):   48.3 ml 24.54 ml/m LA Biplane Vol: 52.0 ml 26.43 ml/m  AORTIC VALVE AV Area (Vmax):    3.00 cm AV Area (Vmean):   3.03 cm AV Area (VTI):     3.09 cm AV Vmax:           135.00 cm/s AV Vmean:          92.900 cm/s AV VTI:            0.238 m AV Peak Grad:      7.3 mmHg AV Mean Grad:      4.0 mmHg LVOT Vmax:         129.00 cm/s LVOT Vmean:        89.700 cm/s LVOT VTI:          0.234 m LVOT/AV VTI ratio: 0.98  AORTA Ao Root diam: 3.30 cm Ao Asc diam:  3.63 cm  SHUNTS Systemic VTI:  0.23 m Systemic Diam: 2.00 cm Cherlynn Kaiser MD Electronically signed by Cherlynn Kaiser MD Signature Date/Time: 05/04/2021/1:17:47 PM    Final     Cardiac Studies   Echo 05/04/2021:   1. Left ventricular ejection fraction, by estimation, is 60 to 65%. The  left ventricle has normal function. The left ventricle has no regional  wall motion abnormalities. There is mild left ventricular hypertrophy.  Left  ventricular diastolic parameters  are indeterminate.   2. Right ventricular systolic function is normal. The right ventricular  size is normal. Tricuspid regurgitation signal is inadequate for assessing  PA pressure.   3. The mitral valve is degenerative. Trivial mitral valve regurgitation.  No evidence of mitral stenosis.   4. The aortic valve is grossly normal. There is mild thickening of the  aortic valve. Aortic valve regurgitation is trivial. No aortic stenosis is  present.   5. The inferior vena cava is dilated in size with >50% respiratory  variability, suggesting right atrial pressure of  8 mmHg.  Patient Profile     87 y.o. male with PMH of SVT, hernia repair, appendectomy, and prior SBO presented for abdominal pain, N/V and painful hernia. Found to be in A fib. Underwent open R inguinal hernia repair with mesh on 05/03/2021.  Assessment & Plan    Paroxysmal A fib RVR, new diagnosis  - hx of SVT in 2018 per chart review, self reports hx of irregular fast heart beat 3 years ago, unsure if A fib  - likely new onset post operative A fib this admission - Echo from 05/04/21 showed EF 60-65%, no RWMA, trivial MR, trivial AR  - currently rate controlled on IV Cardizem gtt at '10mg'$ /hr and PRN IV metoprolol , may transition to PO metoprolol once NG tube is clamped /SBOresolves  - CHA2DS2VASc score is 2 due to age, may transition from Lovenox to PO Eliquis '5mg'$  BID when SBO/ileus resolves  - optimize electrolytes, keep K >4 and Mag >2, will add on Mag level today, K is replaced per IM via IV   SBO with incarcerated R inguinal hernia - s/p open right inguinal hernia repair with mesh by Dr. Bobbye Morton 05/03/21  - CTAP from 05/05/21 showed persistent SBO with transition point in the left lower quadrant anteriorly, possibly due to adhesions - managed by surgery, currently on NG to LIWS for concerning of ileus versus SBO  Ascending aortic dilation/ aneurysm - CT on 05/02/21 showed aneurysmal dilatation of  the ascending aorta up to 4.8 cm. - follow up outpatient with CT surgery       For questions or updates, please contact [facility] Please consult [email or web address] for contact info under        Signed, Margie Billet, NP  05/06/2021, 9:47 AM    Personally seen and examined. Agree with APP above with the following comments: Still awaiting PO diet.  WE will peripherally monitor for arrhythmia worsening or new change in symptoms.  If new issues or able to tolerate PO we will re-engage.  Rudean Haskell, MD [address] [place], [place] [address] [phone]  11:28 AM

## 2021-05-06 NOTE — Progress Notes (Signed)
PROGRESS NOTE    Jeffrie Dicola [facility]  [id number] DOB: 01-15-1931 DOA: 05/02/2021 PCP: Josetta Huddle, MD   Brief Narrative:  Gary Rice is a 87 y.o. male with medical history significant of SVT, hyperlipidemia, appendectomy, bilateral inguinal hernias, previous hernia repair presented to the ED complaining of vomiting.  He reported that his right inguinal hernia had popped and he was sent by his doctor for evaluation of possible bowel obstruction.  In the ED, patient found to be in new onset A. fib with rate in the 160s. CT showing findings consistent with mechanical SBO with transition point related to right inguinal hernia; no perforation.  Moderate fat and small bowel containing left inguinal hernia without adverse features.  ED physician was not able to reduce the inguinal hernia.  General surgery recommended NG tube - given concern for incarcerated RIGHT inguinal hernia patient was taken to OR for repair with mesh overnight 05/03/21.  Assessment & Plan:   Principal Problem:   Atrial fibrillation with rapid ventricular response (HCC) Active Problems:   SBO (small bowel obstruction) (HCC)   Leukocytosis   Hyperlipidemia   Thoracic aortic aneurysm (HCC)   Acute intractable nausea vomiting - Overnight NG clamped and clears initiated - NG restarted this am with large volume output and worsening abdominal distention - Continue NPO/NG tube until specified by surgery - Transition all meds to IV given ongoing profound NG output  New onset A. fib with RVR, likely provoked given below, poorly controlled - Cardiology consulted given poor control with cardizem drip - remains poorly controlled due to NPO status - Cardiology recommending continuing IV Cardizem drip, IV metoprolol 5 mg every 4-6 hours PRN - CHA2DS2-VASc 2 based on age (no clear history of hypertension and not currently on meds).  Patient is currently going to the OR on an urgent basis.  Cardiology recommending starting Eliquis  5 mg twice daily for anticoagulation when safe from surgical standpoint or prior to discharge. - IV fluids and monitor blood pressure - TSH WNL - Echo - EF 60-65%  SBO with incarcerated R inguinal hernia -Gen Sx following - urgent repair overnight 05/03/21 with mesh -Repeat CT shows ongoing SBO in LLQ with transition point - defer to surgery as this is not anatomically near previous procedure -Pain control, diet advancement, and ambulation status per surgery   Leukocytosis, likely reactive given above, resolving - WBC downtrending appropriately with supportive care/surgery - WNL now   Hyperlipidemia -Resume statin when no longer n.p.o.   Ascending thoracic aortic aneurysm CT showing aneurysmal dilatation of the ascending aorta up to 4.8 cm. -Radiologist recommending semiannual imaging follow-up by CTA or MRA -Will need cardiothoracic surgery follow-up outpatient per protocol   DVT prophylaxis: SCDs Code Status: FULL Family Communication: Over the phone  Status is: Inpt  Dispo: The patient is from: Home              Anticipated d/c is to: TBD              Anticipated d/c date is: >72h              Patient currently NOT medically stable for discharge  Consultants:  General Surgery, Cardiology  Procedures:  Hernia/mesh repair 05/03/21  Antimicrobials:  None   Subjective: NG clamped overnight with diet/clears started - worsening distention/abdominal pain this am - NG restarted with large volume output.  Objective: Vitals:   05/05/21 1808 05/05/21 2024 05/05/21 2328 05/06/21 0439  BP: 104/67 114/61 121/87 127/81  Pulse:  90 74 95 87  Resp: '20 18 20 18  '$ Temp: 97.8 F (36.6 C) 98 F (36.7 C) 98.4 F (36.9 C) 99.2 F (37.3 C)  TempSrc: Oral Oral Oral Oral  SpO2: 93% 93% 93% 93%  Weight:      Height:        Intake/Output Summary (Last 24 hours) at 05/06/2021 0741 Last data filed at 05/06/2021 0440 Gross per 24 hour  Intake 1276.5 ml  Output 1400 ml  Net -123.5 ml     Filed Weights   05/02/21 1805  Weight: 77.1 kg    Examination:  General exam: Appears calm and comfortable  Respiratory system: Clear to auscultation. Respiratory effort normal. Cardiovascular system: S1 & S2 heard, RRR. No JVD, murmurs, rubs, gallops or clicks. No pedal edema. Gastrointestinal system: Abdomen is moderately distended, soft and mildly tender. No organomegaly or masses felt. NG tube to LIS. Central nervous system: Alert and oriented. No focal neurological deficits. Extremities: Symmetric 5 x 5 power. Skin: No rashes, lesions or ulcers Psychiatry: Judgement and insight appear normal. Mood & affect appropriate.   Data Reviewed: I have personally reviewed following labs and imaging studies  CBC: Recent Labs  Lab 05/02/21 1806 05/02/21 1814 05/03/21 0150 05/04/21 0540 05/06/21 0451  WBC 18.0*  --  12.3* 6.2 6.9  NEUTROABS 13.7*  --   --   --   --   HGB 16.8 17.0 13.0 13.9 14.5  HCT 49.2 50.0 38.6* 41.1 43.0  MCV 92.7  --  94.1 92.4 92.9  PLT 226  --  179 159 [id number]    Basic Metabolic Panel: Recent Labs  Lab 05/02/21 1806 05/02/21 1814 05/03/21 0150 05/04/21 0540 05/05/21 0152 05/06/21 0451  NA 132* 134* 135 136 135 134*  K 4.1 4.1 3.7 3.2* 3.2* 3.4*  CL 96* 98 99 96* 96* 104  CO2 24  --  '27 29 30 27  '$ GLUCOSE 124* 128* 126* 108* 98 88  BUN '21 22 18 '$ 27* 39* 24*  CREATININE 1.03 1.00 1.09 1.06 1.17 0.93  CALCIUM 10.3  --  8.8* 8.8* 8.6* 7.7*  MG 2.1  --   --   --   --   --     GFR: Estimated Creatinine Clearance: 58.5 mL/min (by C-G formula based on SCr of 0.93 mg/dL). Liver Function Tests: Recent Labs  Lab 05/02/21 1806  AST 31  ALT 20  ALKPHOS 63  BILITOT 1.3*  PROT 8.0  ALBUMIN 4.1    Recent Labs  Lab 05/02/21 1806  LIPASE 27    No results for input(s): AMMONIA in the last 168 hours. Coagulation Profile: No results for input(s): INR, PROTIME in the last 168 hours. Cardiac Enzymes: No results for input(s): CKTOTAL, CKMB,  CKMBINDEX, TROPONINI in the last 168 hours. BNP (last 3 results) No results for input(s): PROBNP in the last 8760 hours. HbA1C: No results for input(s): HGBA1C in the last 72 hours. CBG: Recent Labs  Lab 05/05/21 2133  GLUCAP 96   Lipid Profile: No results for input(s): CHOL, HDL, LDLCALC, TRIG, CHOLHDL, LDLDIRECT in the last 72 hours. Thyroid Function Tests: No results for input(s): TSH, T4TOTAL, FREET4, T3FREE, THYROIDAB in the last 72 hours.  Anemia Panel: No results for input(s): VITAMINB12, FOLATE, FERRITIN, TIBC, IRON, RETICCTPCT in the last 72 hours. Sepsis Labs: No results for input(s): PROCALCITON, LATICACIDVEN in the last 168 hours.  Recent Results (from the past 240 hour(s))  Resp Panel by RT-PCR (Flu A&B, Covid) Nasopharyngeal Swab  Status: None   Collection Time: 05/02/21  9:40 PM   Specimen: Nasopharyngeal Swab; Nasopharyngeal(NP) swabs in vial transport medium  Result Value Ref Range Status   SARS Coronavirus 2 by RT PCR NEGATIVE NEGATIVE Final    Comment: (NOTE) SARS-CoV-2 target nucleic acids are NOT DETECTED.  The SARS-CoV-2 RNA is generally detectable in upper respiratory specimens during the acute phase of infection. The lowest concentration of SARS-CoV-2 viral copies this assay can detect is 138 copies/mL. A negative result does not preclude SARS-Cov-2 infection and should not be used as the sole basis for treatment or other patient management decisions. A negative result may occur with  improper specimen collection/handling, submission of specimen other than nasopharyngeal swab, presence of viral mutation(s) within the areas targeted by this assay, and inadequate number of viral copies(<138 copies/mL). A negative result must be combined with clinical observations, patient history, and epidemiological information. The expected result is Negative.  Fact Sheet for Patients:  EntrepreneurPulse.com.au  Fact Sheet for Healthcare  Providers:  IncredibleEmployment.be  This test is no t yet approved or cleared by the [place] FDA and  has been authorized for detection and/or diagnosis of SARS-CoV-2 by FDA under an Emergency Use Authorization (EUA). This EUA will remain  in effect (meaning this test can be used) for the duration of the COVID-19 declaration under Section 564(b)(1) of the Act, 21 U.S.C.section 360bbb-3(b)(1), unless the authorization is terminated  or revoked sooner.       Influenza A by PCR NEGATIVE NEGATIVE Final   Influenza B by PCR NEGATIVE NEGATIVE Final    Comment: (NOTE) The Xpert Xpress SARS-CoV-2/FLU/RSV plus assay is intended as an aid in the diagnosis of influenza from Nasopharyngeal swab specimens and should not be used as a sole basis for treatment. Nasal washings and aspirates are unacceptable for Xpert Xpress SARS-CoV-2/FLU/RSV testing.  Fact Sheet for Patients: EntrepreneurPulse.com.au  Fact Sheet for Healthcare Providers: IncredibleEmployment.be  This test is not yet approved or cleared by the [place] FDA and has been authorized for detection and/or diagnosis of SARS-CoV-2 by FDA under an Emergency Use Authorization (EUA). This EUA will remain in effect (meaning this test can be used) for the duration of the COVID-19 declaration under Section 564(b)(1) of the Act, 21 U.S.C. section 360bbb-3(b)(1), unless the authorization is terminated or revoked.  Performed at [facility], [place] [address] [place], [place] [address]           Radiology Studies: CT ABDOMEN PELVIS WO CONTRAST  Result Date: 05/05/2021 CLINICAL DATA:  Bowel obstruction suspected EXAM: CT ABDOMEN AND PELVIS WITHOUT CONTRAST TECHNIQUE: Multidetector CT imaging of the abdomen and pelvis was performed following the standard protocol without IV contrast. COMPARISON:  CT 05/02/2021 FINDINGS: Lower chest: Partially visualized ascending aorta  measures 4.4 cm, this measured up to 4.8 cm on prior dedicated CTA of the chest. Small right and trace left pleural effusions with adjacent right basilar atelectasis. Coronary artery calcifications. Hepatobiliary: Atrophic left hepatic lobe. Layering high density in the gallbladder, likely hyperdense sludge and small stones. Pancreas: Unremarkable. No pancreatic ductal dilatation or surrounding inflammatory changes. Spleen: Normal in size without focal abnormality. Adrenals/Urinary Tract: Adrenal glands are unremarkable. No hydronephrosis. Unchanged left renal cysts. Unchanged right renal cyst. No nephrolithiasis. Unchanged bladder dome diverticulum. Stomach/Bowel: There is a nasogastric tube with tip in the stomach. The stomach is unremarkable. There are multiple dilated loops of small bowel, with transition point in the left lower quadrant anteriorly (series 3, image 58 and series  6, image 36). There is a large rectal stool ball. Vascular/Lymphatic: Aortoiliac atherosclerotic calcifications. No AAA. Reproductive: Enlarged prostate. Other: There is a left inguinal hernia containing small bowel. Postsurgical changes of recent open right inguinal hernia repair. There is no evidence of residual bowel in the right inguinal canal. There are postsurgical inflammatory changes in this region and small foci of gas. Musculoskeletal: Prior right hip arthroplasty. No acute osseous abnormality. Dextroconvex curvature of the lumbar spine. Multilevel degenerative changes of the spine, no acute osseous abnormality. No suspicious lytic or blastic lesions. IMPRESSION: Persistent small bowel obstruction, with transition point in the left lower quadrant anteriorly, possibly due to adhesions. Postsurgical changes of recent open right inguinal hernia repair without recurrence. Unchanged small bowel containing left inguinal hernia, which appears unrelated to the current bowel obstruction. Unchanged small right and trace left pleural  effusions. Unchanged layering high density in the gallbladder, likely hyperdense sludge and small stones. Electronically Signed   By: Maurine Simmering   On: 05/05/2021 16:17   DG Abd Portable 1V  Result Date: 05/05/2021 CLINICAL DATA:  87 year old male with right inguinal hernia related SBO. EXAM: PORTABLE ABDOMEN - 1 VIEW COMPARISON:  CT Abdomen and Pelvis 05/02/2021.  KUB 05/03/2021. FINDINGS: Portable AP supine view at 0912 hours. Enteric tube placed, side hole at the level of the GE J. Decompressed stomach compared to 05/03/2021. But small-bowel obstruction gas pattern persists. Gas-filled small bowel loops continue to measure up to 40 mm in the central abdomen. Paucity of large bowel gas as before. Stable visualized osseous structures. IMPRESSION: 1. Enteric tube placed, side hole at the level of the GEJ. Advance 5 cm to ensure side hole placement within the stomach. 2. Continued small bowel obstruction gas pattern, not improved since 05/02/2021. Electronically Signed   By: Genevie Ann M.D.   On: 05/05/2021 10:00   ECHOCARDIOGRAM COMPLETE  Result Date: 05/04/2021    ECHOCARDIOGRAM REPORT   Patient Name:   [facility] Date of Exam: 05/04/2021 Medical Rec #:  [id number]          Height:       71.0 in Accession #:    [id number]         Weight:       170.0 lb Date of Birth:  Mar 07, 1931           BSA:          1.968 m Patient Age:    71 years           BP:           106/68 mmHg Patient Gender: M                  HR:           83 bpm. Exam Location:  Inpatient Procedure: 2D Echo, Cardiac Doppler, Color Doppler and 3D Echo Indications:    I48.91* Unspeicified atrial fibrillation  History:        Patient has prior history of Echocardiogram examinations, most                 recent 08/16/2017.  Sonographer:    Merrie Roof RDCS Referring Phys: [id number] [facility]  Sonographer Comments: Technically difficult study due to poor echo windows. IMPRESSIONS  1. Left ventricular ejection fraction, by estimation, is 60 to  65%. The left ventricle has normal function. The left ventricle has no regional wall motion abnormalities. There is mild left ventricular hypertrophy. Left ventricular diastolic parameters are indeterminate.  2. Right ventricular systolic function is normal. The right ventricular size is normal. Tricuspid regurgitation signal is inadequate for assessing PA pressure.  3. The mitral valve is degenerative. Trivial mitral valve regurgitation. No evidence of mitral stenosis.  4. The aortic valve is grossly normal. There is mild thickening of the aortic valve. Aortic valve regurgitation is trivial. No aortic stenosis is present.  5. The inferior vena cava is dilated in size with >50% respiratory variability, suggesting right atrial pressure of 8 mmHg. FINDINGS  Left Ventricle: Left ventricular ejection fraction, by estimation, is 60 to 65%. The left ventricle has normal function. The left ventricle has no regional wall motion abnormalities. 3D left ventricular ejection fraction analysis performed but not reported based on interpreter judgement due to suboptimal quality. The left ventricular internal cavity size was normal in size. There is mild left ventricular hypertrophy. Left ventricular diastolic parameters are indeterminate. Right Ventricle: The right ventricular size is normal. No increase in right ventricular wall thickness. Right ventricular systolic function is normal. Tricuspid regurgitation signal is inadequate for assessing PA pressure. Left Atrium: Left atrial size was normal in size. Right Atrium: Right atrial size was normal in size. Pericardium: There is no evidence of pericardial effusion. Mitral Valve: The mitral valve is degenerative in appearance. Mild mitral annular calcification. Trivial mitral valve regurgitation. No evidence of mitral valve stenosis. Tricuspid Valve: The tricuspid valve is normal in structure. Tricuspid valve regurgitation is mild . No evidence of tricuspid stenosis. Aortic Valve:  The aortic valve is grossly normal. There is mild thickening of the aortic valve. Aortic valve regurgitation is trivial. No aortic stenosis is present. Aortic valve mean gradient measures 4.0 mmHg. Aortic valve peak gradient measures 7.3 mmHg. Aortic valve area, by VTI measures 3.09 cm. Pulmonic Valve: The pulmonic valve was normal in structure. Pulmonic valve regurgitation is not visualized. No evidence of pulmonic stenosis. Aorta: The aortic root is normal in size and structure. Venous: The inferior vena cava is dilated in size with greater than 50% respiratory variability, suggesting right atrial pressure of 8 mmHg. IAS/Shunts: The interatrial septum was not well visualized.  LEFT VENTRICLE PLAX 2D LVIDd:         3.90 cm LVIDs:         2.70 cm LV PW:         0.90 cm LV IVS:        1.10 cm LVOT diam:     2.00 cm  3D Volume EF: LV SV:         74       3D EF:        69 % LV SV Index:   37 LVOT Area:     3.14 cm  RIGHT VENTRICLE RV Basal diam:  3.20 cm LEFT ATRIUM             Index       RIGHT ATRIUM           Index LA diam:        3.30 cm 1.68 cm/m  RA Area:     17.20 cm LA Vol (A2C):   53.2 ml 27.03 ml/m RA Volume:   41.90 ml  21.29 ml/m LA Vol (A4C):   48.3 ml 24.54 ml/m LA Biplane Vol: 52.0 ml 26.43 ml/m  AORTIC VALVE AV Area (Vmax):    3.00 cm AV Area (Vmean):   3.03 cm AV Area (VTI):     3.09 cm AV Vmax:  135.00 cm/s AV Vmean:          92.900 cm/s AV VTI:            0.238 m AV Peak Grad:      7.3 mmHg AV Mean Grad:      4.0 mmHg LVOT Vmax:         129.00 cm/s LVOT Vmean:        89.700 cm/s LVOT VTI:          0.234 m LVOT/AV VTI ratio: 0.98  AORTA Ao Root diam: 3.30 cm Ao Asc diam:  3.63 cm  SHUNTS Systemic VTI:  0.23 m Systemic Diam: 2.00 cm Cherlynn Kaiser MD Electronically signed by Cherlynn Kaiser MD Signature Date/Time: 05/04/2021/1:17:47 PM    Final         Scheduled Meds:  acetaminophen (TYLENOL) oral liquid 160 mg/5 mL  1,000 mg Per Tube Q6H   enoxaparin (LOVENOX) injection  40  mg Subcutaneous Q24H   ketorolac  15 mg Intravenous Q6H   methocarbamol  1,000 mg Per Tube Q8H   pantoprazole sodium  40 mg Per Tube BID   Continuous Infusions:  diltiazem (CARDIZEM) infusion 10 mg/hr (05/05/21 0640)   lactated ringers 70 mL/hr at 05/05/21 0415   promethazine (PHENERGAN) injection (IM or IVPB) 70 mL/hr at 05/04/21 0230     LOS: 4 days    Time spent: 70mn   Ashyr Hedgepath C Cashlynn Yearwood, DO [facility]  If 7PM-7AM, please contact night-coverage [email or web address]  05/06/2021, 7:41 AM

## 2021-05-06 NOTE — Care Management Important Message (Signed)
Important Message  Patient Details  Name: Gary Rice MRN: [id number] Date of Birth: Jan 31, 1931   Medicare Important Message Given:  Yes     Shelda Altes 05/06/2021, 9:53 AM

## 2021-05-07 ENCOUNTER — Inpatient Hospital Stay (HOSPITAL_COMMUNITY): Payer: Medicare Other

## 2021-05-07 DIAGNOSIS — I4891 Unspecified atrial fibrillation: Secondary | ICD-10-CM | POA: Diagnosis not present

## 2021-05-07 MED ORDER — FLEET ENEMA 7-19 GM/118ML RE ENEM
1.0000 | ENEMA | Freq: Once | RECTAL | Status: AC
  Administered 2021-05-07: 1 via RECTAL
  Filled 2021-05-07: qty 1

## 2021-05-07 NOTE — Progress Notes (Addendum)
Progress Note  Patient Name: Gary Rice [facility], [place] Date of Encounter: 05/07/2021  [facility] Cardiologist: Candee Furbish, MD   Subjective   Patient states he feels a little bloated, no abdominal pain, has not been able to move BMs or passing gas for 2 days. He denied having any chest pain, heart palpitation, dizziness over the past 24 hours.   Inpatient Medications    Scheduled Meds:  acetaminophen (TYLENOL) oral liquid 160 mg/5 mL  1,000 mg Per Tube Q6H   enoxaparin (LOVENOX) injection  40 mg Subcutaneous Q24H   ketorolac  15 mg Intravenous Q6H   pantoprazole (PROTONIX) IV  40 mg Intravenous Q24H   sodium phosphate  1 enema Rectal Once   Continuous Infusions:  diltiazem (CARDIZEM) infusion 10 mg/hr (05/07/21 0739)   lactated ringers 70 mL/hr at 05/06/21 2000   methocarbamol (ROBAXIN) IV     promethazine (PHENERGAN) injection (IM or IVPB) 70 mL/hr at 05/04/21 0230   PRN Meds: acetaminophen **OR** acetaminophen, methocarbamol (ROBAXIN) IV, metoprolol tartrate, morphine injection, ondansetron (ZOFRAN) IV, phenol, promethazine (PHENERGAN) injection (IM or IVPB)   Vital Signs    Vitals:   05/06/21 1550 05/06/21 2200 05/06/21 2329 05/07/21 0327  BP: 101/66 129/77 112/74 123/69  Pulse: 85 85 74 78  Resp: '20 20 17 20  '$ Temp: 97.9 F (36.6 C) 98.5 F (36.9 C) 98.9 F (37.2 C) 98.1 F (36.7 C)  TempSrc: Oral Tympanic Oral Oral  SpO2: 93% 94% 94% 98%  Weight:      Height:        Intake/Output Summary (Last 24 hours) at 05/07/2021 0856 Last data filed at 05/06/2021 2233 Gross per 24 hour  Intake 2183.09 ml  Output 1100 ml  Net 1083.09 ml   Last 3 Weights 05/02/2021 10/18/2018 10/08/2017  Weight (lbs) 170 lb 174 lb 3.2 oz 179 lb  Weight (kg) 77.111 kg 79.017 kg 81.194 kg      Telemetry    SR this morning with rate of 80s, frequent PACs, occasional PVCs, episode of A fib RVR with rate of 148s over the past 24 hours (around 1700) - Personally Reviewed  ECG    N/A this AM   - Personally Reviewed  Physical Exam   GEN: No acute distress.   Neck: No JVD Cardiac: RRR, no murmurs, rubs, or gallops.  Respiratory: Clear to auscultation bilaterally. GI: Soft, non-tender. NG to [place], dark green/bile colored drainage noted in canister  MS: No BLE edema, SCDs in place; No deformity. Neuro:  Nonfocal  Psych: Normal affect   Labs    High Sensitivity Troponin:   Recent Labs  Lab 05/02/21 1806 05/02/21 2007  TROPONINIHS 17 20*      Chemistry Recent Labs  Lab 05/02/21 1806 05/02/21 1814 05/04/21 0540 05/05/21 0152 05/06/21 0451  NA 132*   < > 136 135 134*  K 4.1   < > 3.2* 3.2* 3.4*  CL 96*   < > 96* 96* 104  CO2 24   < > '29 30 27  '$ GLUCOSE 124*   < > 108* 98 88  BUN 21   < > 27* 39* 24*  CREATININE 1.03   < > 1.06 1.17 0.93  CALCIUM 10.3   < > 8.8* 8.6* 7.7*  PROT 8.0  --   --   --   --   ALBUMIN 4.1  --   --   --   --   AST 31  --   --   --   --  ALT 20  --   --   --   --   ALKPHOS 63  --   --   --   --   BILITOT 1.3*  --   --   --   --   GFRNONAA >60   < > >60 60* >60  ANIONGAP 12   < > 11 9 3*   < > = values in this interval not displayed.     Hematology Recent Labs  Lab 05/03/21 0150 05/04/21 0540 05/06/21 0451  WBC 12.3* 6.2 6.9  RBC 4.10* 4.45 4.63  HGB 13.0 13.9 14.5  HCT 38.6* 41.1 43.0  MCV 94.1 92.4 92.9  MCH 31.7 31.2 31.3  MCHC 33.7 33.8 33.7  RDW 13.0 13.0 12.7  PLT 179 159 180    BNPNo results for input(s): BNP, PROBNP in the last 168 hours.   DDimer No results for input(s): DDIMER in the last 168 hours.   Radiology    CT ABDOMEN PELVIS WO CONTRAST  Result Date: 05/05/2021 CLINICAL DATA:  Bowel obstruction suspected EXAM: CT ABDOMEN AND PELVIS WITHOUT CONTRAST TECHNIQUE: Multidetector CT imaging of the abdomen and pelvis was performed following the standard protocol without IV contrast. COMPARISON:  CT 05/02/2021 FINDINGS: Lower chest: Partially visualized ascending aorta measures 4.4 cm, this measured up to 4.8  cm on prior dedicated CTA of the chest. Small right and trace left pleural effusions with adjacent right basilar atelectasis. Coronary artery calcifications. Hepatobiliary: Atrophic left hepatic lobe. Layering high density in the gallbladder, likely hyperdense sludge and small stones. Pancreas: Unremarkable. No pancreatic ductal dilatation or surrounding inflammatory changes. Spleen: Normal in size without focal abnormality. Adrenals/Urinary Tract: Adrenal glands are unremarkable. No hydronephrosis. Unchanged left renal cysts. Unchanged right renal cyst. No nephrolithiasis. Unchanged bladder dome diverticulum. Stomach/Bowel: There is a nasogastric tube with tip in the stomach. The stomach is unremarkable. There are multiple dilated loops of small bowel, with transition point in the left lower quadrant anteriorly (series 3, image 58 and series 6, image 36). There is a large rectal stool ball. Vascular/Lymphatic: Aortoiliac atherosclerotic calcifications. No AAA. Reproductive: Enlarged prostate. Other: There is a left inguinal hernia containing small bowel. Postsurgical changes of recent open right inguinal hernia repair. There is no evidence of residual bowel in the right inguinal canal. There are postsurgical inflammatory changes in this region and small foci of gas. Musculoskeletal: Prior right hip arthroplasty. No acute osseous abnormality. Dextroconvex curvature of the lumbar spine. Multilevel degenerative changes of the spine, no acute osseous abnormality. No suspicious lytic or blastic lesions. IMPRESSION: Persistent small bowel obstruction, with transition point in the left lower quadrant anteriorly, possibly due to adhesions. Postsurgical changes of recent open right inguinal hernia repair without recurrence. Unchanged small bowel containing left inguinal hernia, which appears unrelated to the current bowel obstruction. Unchanged small right and trace left pleural effusions. Unchanged layering high density in  the gallbladder, likely hyperdense sludge and small stones. Electronically Signed   By: Maurine Simmering   On: 05/05/2021 16:17   DG Abd Portable 1V  Result Date: 05/07/2021 CLINICAL DATA:  Obstruction EXAM: PORTABLE ABDOMEN - 1 VIEW COMPARISON:  Portable exam 0525 hours compared to 05/06/2021 FINDINGS: Nasogastric tube projects over proximal stomach. Retained contrast in decompressed RIGHT colon. Numerous air-filled distended loops of small bowel throughout abdomen consistent with persistent small-bowel obstruction. Some gas is present in the distal sigmoid colon and rectum. No bowel wall thickening. Degenerative disc disease changes and dextroconvex scoliosis lumbar spine. Osseous demineralization  and RIGHT hip prosthesis noted. IMPRESSION: Persistent small bowel obstruction Electronically Signed   By: Lavonia Dana M.D.   On: 05/07/2021 08:18   DG Abd Portable 1V  Result Date: 05/06/2021 CLINICAL DATA:  Small-bowel obstruction EXAM: PORTABLE ABDOMEN - 1 VIEW COMPARISON:  Portable exam 0844 hours compared to 05/05/2021 FINDINGS: Air-filled distended small bowel loops in the abdomen consistent with small bowel obstruction. Loops are in general little changed in diameter versus previous exam. Small amount retained contrast in RIGHT colon. No definite bowel wall thickening. Bones demineralized with degenerative changes and scoliosis of lumbar spine as well as note of a RIGHT hip prosthesis. IMPRESSION: Persistent small bowel obstruction. Electronically Signed   By: Lavonia Dana M.D.   On: 05/06/2021 10:26   DG Abd Portable 1V  Result Date: 05/05/2021 CLINICAL DATA:  87 year old male with right inguinal hernia related SBO. EXAM: PORTABLE ABDOMEN - 1 VIEW COMPARISON:  CT Abdomen and Pelvis 05/02/2021.  KUB 05/03/2021. FINDINGS: Portable AP supine view at 0912 hours. Enteric tube placed, side hole at the level of the GE J. Decompressed stomach compared to 05/03/2021. But small-bowel obstruction gas pattern persists.  Gas-filled small bowel loops continue to measure up to 40 mm in the central abdomen. Paucity of large bowel gas as before. Stable visualized osseous structures. IMPRESSION: 1. Enteric tube placed, side hole at the level of the GEJ. Advance 5 cm to ensure side hole placement within the stomach. 2. Continued small bowel obstruction gas pattern, not improved since 05/02/2021. Electronically Signed   By: Genevie Ann M.D.   On: 05/05/2021 10:00    Cardiac Studies   Echo 05/04/2021:   1. Left ventricular ejection fraction, by estimation, is 60 to 65%. The  left ventricle has normal function. The left ventricle has no regional  wall motion abnormalities. There is mild left ventricular hypertrophy.  Left ventricular diastolic parameters  are indeterminate.   2. Right ventricular systolic function is normal. The right ventricular  size is normal. Tricuspid regurgitation signal is inadequate for assessing  PA pressure.   3. The mitral valve is degenerative. Trivial mitral valve regurgitation.  No evidence of mitral stenosis.   4. The aortic valve is grossly normal. There is mild thickening of the  aortic valve. Aortic valve regurgitation is trivial. No aortic stenosis is  present.   5. The inferior vena cava is dilated in size with >50% respiratory  variability, suggesting right atrial pressure of 8 mmHg.  Patient Profile     87 y.o. male with PMH of SVT, hernia repair, appendectomy, and prior SBO presented for abdominal pain, N/V and painful hernia. Found to be in A fib. Underwent open R inguinal hernia repair with mesh on 05/03/2021.  Assessment & Plan    Paroxysmal A fib RVR, new diagnosis  - hx of SVT in 2018 per chart review, self reports hx of irregular fast heart beat 3 years ago, unsure if A fib  - likely new onset post operative A fib this admission - Echo from 05/04/21 showed EF 60-65%, no RWMA, trivial MR, trivial AR  - currently in SR, continue to be in and out of A fib on telemetry,  asymptomatic with RVR episodes, continued on IV Cardizem gtt at '10mg'$ /hr and PRN IV metoprolol , may transition to PO metoprolol once NG tube is clamped /SBO resolves  - CHA2DS2VASc score is 2 due to age, currently not on therapeutic Lovenox (only '40mg'$  daily for DVT ppx) per primary team, consider transition to therapeutic  dose if OK per surgery and eventually transition to PO Eliquis '5mg'$  BID when SBO/ileus resolves, discussed bleeding precaution  - optimize electrolytes, keep K >4 and Mag >2  Hypokalemia - Mag WNL from 05/06/21  - consider adding KCL to IVF with continuous NG suction   SBO with incarcerated R inguinal hernia - s/p open right inguinal hernia repair with mesh by Dr. Bobbye Morton 05/03/21  - CTAP from 05/05/21 showed persistent SBO with transition point in the left lower quadrant anteriorly, possibly due to adhesions - X-ray 8/10 showed persistent SBO , surgery planned for enema today  - consider avoid morphine if possible  - managed by surgery, currently still on NG to LIWS for concerning of ileus versus SBO  Ascending aortic dilation/ aneurysm - CT on 05/02/21 showed aneurysmal dilatation of the ascending aorta up to 4.8 cm. - follow up outpatient with CT surgery       For questions or updates, please contact [facility] Please consult [email or web address] for contact info under        Signed, Margie Billet, NP  05/07/2021, 8:56 AM    Personally seen and examined. Agree with APP above with the following comments: Passing gas but not clear Bowel Sounds pending potential enema. Still not tolerating PO. Rare asymptomatic PVCs IV treatment until PO transition  Rudean Haskell, MD [place]  [address] [place], [place] [address] [phone]  11:16 AM

## 2021-05-07 NOTE — Progress Notes (Signed)
Physical Therapy Treatment Patient Details Name: Gary Rice MRN: [id number] DOB: January 25, 1931 Today's Date: 05/07/2021    History of Present Illness 87 yo male admitted 8/522 after being referred to ED by PCP secondary to incarcerated inguinal hernia causing SBO, also found to be in Afib with RVR in the ED. Received surgical repair of incarcerated inguinal hernia 05/02/21. PMH hernia repair joint replacement.    PT Comments    Pt received in supine, agreeable to therapy session and with good participation and tolerance for gait and stair training. Pt Supervision level for gait with RW, transfers and stair training with min safety cues needed only. Pt continues to benefit from PT services to progress toward functional mobility goals. Continue to recommend HHPT.   Follow Up Recommendations  Home health PT     Equipment Recommendations  Rolling walker with 5" wheels    Recommendations for Other Services       Precautions / Restrictions Precautions Precautions: Fall Precaution Comments: s/p inguinal hernia repair, NG tube to wall suction, watch SPO2 and HR Restrictions Weight Bearing Restrictions: No    Mobility  Bed Mobility Overal bed mobility: Needs Assistance Bed Mobility: Supine to Sit     Supine to sit: Supervision;HOB elevated     General bed mobility comments: no physical assist, verbal review for log roll sequencing but pt ignores cues and utilizes abdominal muscles heavily with poor technique to log roll; pt defers staff assist    Transfers Overall transfer level: Needs assistance Equipment used: Rolling walker (2 wheeled) Transfers: Sit to/from [facility] Sit to Stand: Supervision         General transfer comment: from EOB to RW, vcs for hand placement, pt needs x2 attempts to achieve upright; to recliner, pt able to sit without UE support but some decreased eccentric control toward end of transfer  Ambulation/Gait Ambulation/Gait  assistance: Supervision Gait Distance (Feet): 300 Feet Assistive device: Rolling walker (2 wheeled) Gait Pattern/deviations: Step-through pattern   Gait velocity interpretation: 1.31 - 2.62 ft/sec, indicative of limited community ambulator General Gait Details: good use of RW, fair pace (1.47 ft/sec), no DOE   Stairs Stairs: Yes Stairs assistance: Supervision Stair Management: Two rails;Step to pattern;Forwards Number of Stairs: 3 General stair comments: pt ascended/descended 3 steps with no LOB, slow/careful pace   Wheelchair Mobility    Modified Rankin (Stroke Patients Only)       Balance Overall balance assessment: Modified Independent Sitting-balance support: Feet supported Sitting balance-Leahy Scale: Good     Standing balance support: Bilateral upper extremity supported;During functional activity Standing balance-Leahy Scale: Fair Standing balance comment: No external assist required, using RW for support.                            Cognition Arousal/Alertness: Awake/alert Behavior During Therapy: WFL for tasks assessed/performed Overall Cognitive Status: Within Functional Limits for tasks assessed             General Comments: pleasant throughout      Exercises Other Exercises Other Exercises: standing BLE AROM: heel raises x 5 reps    General Comments General comments (skin integrity, edema, etc.): BP 129/56 (75) post-exertion (BP 130/80 supine ~1hr prior); HR 80's-100 bpm, no DOE or dizziness reported      Pertinent Vitals/Pain Pain Assessment: Faces Faces Pain Scale: Hurts a little bit Pain Location: sore throat (NGT) Pain Descriptors / Indicators: Sore Pain Intervention(s): Monitored during session;Repositioned  PT Goals (current goals can now be found in the care plan section) Acute Rehab PT Goals Patient Stated Goal: To go home PT Goal Formulation: With patient Time For Goal Achievement: 05/18/21 Potential to Achieve  Goals: Good Progress towards PT goals: Progressing toward goals    Frequency    Min 3X/week      PT Plan Current plan remains appropriate       AM-PAC PT "6 Clicks" Mobility   Outcome Measure  Help needed turning from your back to your side while in a flat bed without using bedrails?: A Little Help needed moving from lying on your back to sitting on the side of a flat bed without using bedrails?: A Little Help needed moving to and from a bed to a chair (including a wheelchair)?: A Little Help needed standing up from a chair using your arms (e.g., wheelchair or bedside chair)?: A Little Help needed to walk in hospital room?: A Little Help needed climbing 3-5 steps with a railing? : A Little 6 Click Score: 18    End of Session   Activity Tolerance: Patient tolerated treatment well Patient left: in chair;with call bell/phone within reach;with chair alarm set Nurse Communication: Mobility status PT Visit Diagnosis: Muscle weakness (generalized) (M62.81)     Time: [id number] PT Time Calculation (min) (ACUTE ONLY): 23 min  Charges:  $Gait Training: 23-37 mins                     Larine Fielding P., PTA Acute Rehabilitation Services Pager: [phone] Office: [place] 05/07/2021, 11:30 AM

## 2021-05-07 NOTE — Progress Notes (Signed)
PROGRESS NOTE    Gary Rice [facility]  [id number] DOB: [birth year] DOA: 05/02/2021 PCP: Josetta Huddle, MD   Brief Narrative:  Gary Rice is a 87 y.o. male with medical history significant of SVT, hyperlipidemia, appendectomy, bilateral inguinal hernias, previous hernia repair presented to the ED complaining of vomiting.  He reported that his right inguinal hernia had popped and he was sent by his doctor for evaluation of possible bowel obstruction.  In the ED, patient found to be in new onset A. fib with rate in the 160s. CT showing findings consistent with mechanical SBO with transition point related to right inguinal hernia; no perforation.  Moderate fat and small bowel containing left inguinal hernia without adverse features.  ED physician was not able to reduce the inguinal hernia.  General surgery recommended NG tube - given concern for incarcerated RIGHT inguinal hernia patient was taken to OR for repair with mesh overnight 05/03/21.  Assessment & Plan:   Principal Problem:   Atrial fibrillation with rapid ventricular response (HCC) Active Problems:   SBO (small bowel obstruction) (HCC)   Leukocytosis   Hyperlipidemia   Thoracic aortic aneurysm (HCC)  Acute intractable nausea vomiting -Repeat imaging shows contrast in the colon, hopefully indicative of resolving small bowel obstruction as below - Continue NPO/NG tube until specified by surgery - Transition all meds to IV given ongoing profound NG output  New onset A. fib with RVR, likely provoked given below, poorly controlled - Cardiology consulted given poor control with cardizem drip - remains poorly controlled due to NPO status - Cardiology recommending continuing IV Cardizem drip, IV metoprolol 5 mg every 4-6 hours PRN - CHA2DS2-VASc 2 based on age (no clear history of hypertension and not currently on meds).  Patient is currently going to the OR on an urgent basis.  Cardiology recommending starting Eliquis 5 mg twice daily  for anticoagulation when safe from surgical standpoint or prior to discharge. - IV fluids and monitor blood pressure - TSH WNL - Echo - EF 60-65%  SBO with incarcerated R inguinal hernia -Gen Sx following - urgent repair overnight 05/03/21 with mesh -Repeat CT shows ongoing SBO in LLQ with transition point - defer to surgery as this is not anatomically near previous procedure -repeat imaging shows contrast in the colon as above -Pain control, diet advancement, and ambulation status per surgery   Leukocytosis, likely reactive given above, resolved - WBC downtrending appropriately with supportive care/surgery - WNL now   Hyperlipidemia -Resume statin when no longer n.p.o.   Ascending thoracic aortic aneurysm - CT showing aneurysmal dilatation of the ascending aorta up to 4.8 cm. -Radiologist recommending semiannual imaging follow-up by CTA or MRA -Will need cardiothoracic surgery follow-up outpatient per protocol   DVT prophylaxis: SCDs Code Status: FULL Family Communication: Over the phone  Status is: Inpt  Dispo: The patient is from: Home              Anticipated d/c is to: Home with home health PT              Anticipated d/c date is: 48-72h              Patient currently NOT medically stable for discharge  Consultants:  General Surgery, Cardiology  Procedures:  Hernia/mesh repair 05/03/21  Antimicrobials:  None   Subjective: No acute issues or events overnight, abdominal distention and pain resolving denies nausea vomiting diarrhea headache fevers or chills.  Denies any further episodes of bowel movement or flatus over the  last 24 hours.  Objective: Vitals:   05/06/21 1550 05/06/21 2200 05/06/21 2329 05/07/21 0327  BP: 101/66 129/77 112/74 123/69  Pulse: 85 85 74 78  Resp: '20 20 17 20  '$ Temp: 97.9 F (36.6 C) 98.5 F (36.9 C) 98.9 F (37.2 C) 98.1 F (36.7 C)  TempSrc: Oral Tympanic Oral Oral  SpO2: 93% 94% 94% 98%  Weight:      Height:        Intake/Output  Summary (Last 24 hours) at 05/07/2021 0740 Last data filed at 05/06/2021 2233 Gross per 24 hour  Intake 2183.09 ml  Output 2200 ml  Net -16.91 ml    Filed Weights   05/02/21 1805  Weight: 77.1 kg    Examination:  General exam: Appears calm and comfortable  Respiratory system: Clear to auscultation. Respiratory effort normal. Cardiovascular system: S1 & S2 heard, RRR. No JVD, murmurs, rubs, gallops or clicks. No pedal edema. Gastrointestinal system: Abdomen is moderately distended, soft and mildly tender. No organomegaly or masses felt. NG tube to LIS -draining dark green fluid. Central nervous system: Alert and oriented. No focal neurological deficits. Extremities: Symmetric 5 x 5 power. Skin: No rashes, lesions or ulcers Psychiatry: Judgement and insight appear normal. Mood & affect appropriate.   Data Reviewed: I have personally reviewed following labs and imaging studies  CBC: Recent Labs  Lab 05/02/21 1806 05/02/21 1814 05/03/21 0150 05/04/21 0540 05/06/21 0451  WBC 18.0*  --  12.3* 6.2 6.9  NEUTROABS 13.7*  --   --   --   --   HGB 16.8 17.0 13.0 13.9 14.5  HCT 49.2 50.0 38.6* 41.1 43.0  MCV 92.7  --  94.1 92.4 92.9  PLT 226  --  179 159 [id number]    Basic Metabolic Panel: Recent Labs  Lab 05/02/21 1806 05/02/21 1814 05/03/21 0150 05/04/21 0540 05/05/21 0152 05/06/21 0451  NA 132* 134* 135 136 135 134*  K 4.1 4.1 3.7 3.2* 3.2* 3.4*  CL 96* 98 99 96* 96* 104  CO2 24  --  '27 29 30 27  '$ GLUCOSE 124* 128* 126* 108* 98 88  BUN '21 22 18 '$ 27* 39* 24*  CREATININE 1.03 1.00 1.09 1.06 1.17 0.93  CALCIUM 10.3  --  8.8* 8.8* 8.6* 7.7*  MG 2.1  --   --   --   --  1.9    GFR: Estimated Creatinine Clearance: 58.5 mL/min (by C-G formula based on SCr of 0.93 mg/dL). Liver Function Tests: Recent Labs  Lab 05/02/21 1806  AST 31  ALT 20  ALKPHOS 63  BILITOT 1.3*  PROT 8.0  ALBUMIN 4.1    Recent Labs  Lab 05/02/21 1806  LIPASE 27    No results for input(s):  AMMONIA in the last 168 hours. Coagulation Profile: No results for input(s): INR, PROTIME in the last 168 hours. Cardiac Enzymes: No results for input(s): CKTOTAL, CKMB, CKMBINDEX, TROPONINI in the last 168 hours. BNP (last 3 results) No results for input(s): PROBNP in the last 8760 hours. HbA1C: No results for input(s): HGBA1C in the last 72 hours. CBG: Recent Labs  Lab 05/05/21 2133  GLUCAP 96    Lipid Profile: No results for input(s): CHOL, HDL, LDLCALC, TRIG, CHOLHDL, LDLDIRECT in the last 72 hours. Thyroid Function Tests: No results for input(s): TSH, T4TOTAL, FREET4, T3FREE, THYROIDAB in the last 72 hours.  Anemia Panel: No results for input(s): VITAMINB12, FOLATE, FERRITIN, TIBC, IRON, RETICCTPCT in the last 72 hours. Sepsis Labs: No results for  input(s): PROCALCITON, LATICACIDVEN in the last 168 hours.  Recent Results (from the past 240 hour(s))  Resp Panel by RT-PCR (Flu A&B, Covid) Nasopharyngeal Swab     Status: None   Collection Time: 05/02/21  9:40 PM   Specimen: Nasopharyngeal Swab; Nasopharyngeal(NP) swabs in vial transport medium  Result Value Ref Range Status   SARS Coronavirus 2 by RT PCR NEGATIVE NEGATIVE Final    Comment: (NOTE) SARS-CoV-2 target nucleic acids are NOT DETECTED.  The SARS-CoV-2 RNA is generally detectable in upper respiratory specimens during the acute phase of infection. The lowest concentration of SARS-CoV-2 viral copies this assay can detect is 138 copies/mL. A negative result does not preclude SARS-Cov-2 infection and should not be used as the sole basis for treatment or other patient management decisions. A negative result may occur with  improper specimen collection/handling, submission of specimen other than nasopharyngeal swab, presence of viral mutation(s) within the areas targeted by this assay, and inadequate number of viral copies(<138 copies/mL). A negative result must be combined with clinical observations, patient  history, and epidemiological information. The expected result is Negative.  Fact Sheet for Patients:  EntrepreneurPulse.com.au  Fact Sheet for Healthcare Providers:  IncredibleEmployment.be  This test is no t yet approved or cleared by the [place] FDA and  has been authorized for detection and/or diagnosis of SARS-CoV-2 by FDA under an Emergency Use Authorization (EUA). This EUA will remain  in effect (meaning this test can be used) for the duration of the COVID-19 declaration under Section 564(b)(1) of the Act, 21 U.S.C.section 360bbb-3(b)(1), unless the authorization is terminated  or revoked sooner.       Influenza A by PCR NEGATIVE NEGATIVE Final   Influenza B by PCR NEGATIVE NEGATIVE Final    Comment: (NOTE) The Xpert Xpress SARS-CoV-2/FLU/RSV plus assay is intended as an aid in the diagnosis of influenza from Nasopharyngeal swab specimens and should not be used as a sole basis for treatment. Nasal washings and aspirates are unacceptable for Xpert Xpress SARS-CoV-2/FLU/RSV testing.  Fact Sheet for Patients: EntrepreneurPulse.com.au  Fact Sheet for Healthcare Providers: IncredibleEmployment.be  This test is not yet approved or cleared by the [place] FDA and has been authorized for detection and/or diagnosis of SARS-CoV-2 by FDA under an Emergency Use Authorization (EUA). This EUA will remain in effect (meaning this test can be used) for the duration of the COVID-19 declaration under Section 564(b)(1) of the Act, 21 U.S.C. section 360bbb-3(b)(1), unless the authorization is terminated or revoked.  Performed at [facility], [place] [address], [place], [place] [address]           Radiology Studies: CT ABDOMEN PELVIS WO CONTRAST  Result Date: 05/05/2021 CLINICAL DATA:  Bowel obstruction suspected EXAM: CT ABDOMEN AND PELVIS WITHOUT CONTRAST TECHNIQUE: Multidetector CT imaging of  the abdomen and pelvis was performed following the standard protocol without IV contrast. COMPARISON:  CT 05/02/2021 FINDINGS: Lower chest: Partially visualized ascending aorta measures 4.4 cm, this measured up to 4.8 cm on prior dedicated CTA of the chest. Small right and trace left pleural effusions with adjacent right basilar atelectasis. Coronary artery calcifications. Hepatobiliary: Atrophic left hepatic lobe. Layering high density in the gallbladder, likely hyperdense sludge and small stones. Pancreas: Unremarkable. No pancreatic ductal dilatation or surrounding inflammatory changes. Spleen: Normal in size without focal abnormality. Adrenals/Urinary Tract: Adrenal glands are unremarkable. No hydronephrosis. Unchanged left renal cysts. Unchanged right renal cyst. No nephrolithiasis. Unchanged bladder dome diverticulum. Stomach/Bowel: There is a nasogastric tube with tip  in the stomach. The stomach is unremarkable. There are multiple dilated loops of small bowel, with transition point in the left lower quadrant anteriorly (series 3, image 58 and series 6, image 36). There is a large rectal stool ball. Vascular/Lymphatic: Aortoiliac atherosclerotic calcifications. No AAA. Reproductive: Enlarged prostate. Other: There is a left inguinal hernia containing small bowel. Postsurgical changes of recent open right inguinal hernia repair. There is no evidence of residual bowel in the right inguinal canal. There are postsurgical inflammatory changes in this region and small foci of gas. Musculoskeletal: Prior right hip arthroplasty. No acute osseous abnormality. Dextroconvex curvature of the lumbar spine. Multilevel degenerative changes of the spine, no acute osseous abnormality. No suspicious lytic or blastic lesions. IMPRESSION: Persistent small bowel obstruction, with transition point in the left lower quadrant anteriorly, possibly due to adhesions. Postsurgical changes of recent open right inguinal hernia repair  without recurrence. Unchanged small bowel containing left inguinal hernia, which appears unrelated to the current bowel obstruction. Unchanged small right and trace left pleural effusions. Unchanged layering high density in the gallbladder, likely hyperdense sludge and small stones. Electronically Signed   By: Maurine Simmering   On: 05/05/2021 16:17   DG Abd Portable 1V  Result Date: 05/06/2021 CLINICAL DATA:  Small-bowel obstruction EXAM: PORTABLE ABDOMEN - 1 VIEW COMPARISON:  Portable exam 0844 hours compared to 05/05/2021 FINDINGS: Air-filled distended small bowel loops in the abdomen consistent with small bowel obstruction. Loops are in general little changed in diameter versus previous exam. Small amount retained contrast in RIGHT colon. No definite bowel wall thickening. Bones demineralized with degenerative changes and scoliosis of lumbar spine as well as note of a RIGHT hip prosthesis. IMPRESSION: Persistent small bowel obstruction. Electronically Signed   By: Lavonia Dana M.D.   On: 05/06/2021 10:26   DG Abd Portable 1V  Result Date: 05/05/2021 CLINICAL DATA:  87 year old male with right inguinal hernia related SBO. EXAM: PORTABLE ABDOMEN - 1 VIEW COMPARISON:  CT Abdomen and Pelvis 05/02/2021.  KUB 05/03/2021. FINDINGS: Portable AP supine view at 0912 hours. Enteric tube placed, side hole at the level of the GE J. Decompressed stomach compared to 05/03/2021. But small-bowel obstruction gas pattern persists. Gas-filled small bowel loops continue to measure up to 40 mm in the central abdomen. Paucity of large bowel gas as before. Stable visualized osseous structures. IMPRESSION: 1. Enteric tube placed, side hole at the level of the GEJ. Advance 5 cm to ensure side hole placement within the stomach. 2. Continued small bowel obstruction gas pattern, not improved since 05/02/2021. Electronically Signed   By: Genevie Ann M.D.   On: 05/05/2021 10:00        Scheduled Meds:  acetaminophen (TYLENOL) oral liquid  160 mg/5 mL  1,000 mg Per Tube Q6H   enoxaparin (LOVENOX) injection  40 mg Subcutaneous Q24H   ketorolac  15 mg Intravenous Q6H   pantoprazole (PROTONIX) IV  40 mg Intravenous Q24H   Continuous Infusions:  diltiazem (CARDIZEM) infusion 10 mg/hr (05/07/21 0739)   lactated ringers 70 mL/hr at 05/06/21 2000   methocarbamol (ROBAXIN) IV     promethazine (PHENERGAN) injection (IM or IVPB) 70 mL/hr at 05/04/21 0230     LOS: 5 days    Time spent: 64mn   Donesha Wallander C Sonny Poth, DO [facility]  If 7PM-7AM, please contact night-coverage [email or web address]  05/07/2021, 7:40 AM

## 2021-05-07 NOTE — TOC Initial Note (Signed)
Transition of Care [facility]) - Initial/Assessment Note    Patient Details  Name: Gary Rice MRN: [id number] Date of Birth: [birth year]  Transition [facility] CM/SW Contact:    Sherrilyn Rist Transition of Care Supervisor Phone Number: [phone] 05/07/2021, 12:02 PM  Clinical Narrative:                 Talked to patient via phone; patient lives at home with spouse; PCP is Dr Inda Merlin; [facility] DME - cane he will use as needed; [place] offered, pt is agreeable and chose [facility]; Cheryl with [facility] called for arrangements. No other needs identified at this time. TOC will continue to follow for progression of care.  Expected Discharge Plan: [place] Barriers to Discharge: No Barriers Identified   Patient Goals and CMS Choice Patient states their goals for this hospitalization and ongoing recovery are:: To get stronger and get out of here CMS Medicare.gov Compare Post Acute Care list provided to:: Patient Choice offered to / list presented to : Patient  Expected Discharge Plan and Services Expected Discharge Plan: [place] In-house Referral: NA Discharge Planning Services: CM Consult Post Acute Care Choice: NA Living arrangements for the past 2 months: Single Family Home                   DME Agency: NA       HH Arranged: RN, PT, OT HH Agency: [place] Date [facility] Agency Contacted: 05/07/21 Time [place]: 16 Representative spoke with at [place]: Malachy Mood  Prior Living Arrangements/Services Living arrangements for the past 2 months: [place] with:: Spouse Patient language and need for interpreter reviewed:: No Do you feel safe going back to the place where you live?: Yes      Need for Family Participation in Patient Care: No (Comment) Care giver support system in place?: Yes (comment)   Criminal Activity/Legal Involvement Pertinent to Current  Situation/Hospitalization: No - Comment as needed  Activities of Daily Living      Permission Sought/Granted Permission sought to share information with : Case Manager Permission granted to share information with : Yes, Verbal Permission Granted  Share Information with NAME: Spouse  Permission granted to share info w AGENCY: [facility]        Emotional Assessment Appearance:: Appears stated age Attitude/Demeanor/Rapport: Gracious Affect (typically observed): Accepting Orientation: : Oriented to Self, Oriented to Place, Oriented to  Time, Oriented to Situation Alcohol / Substance Use: Not Applicable Psych Involvement: No (comment)  Admission diagnosis:  SBO (small bowel obstruction) (HCC) [K56.609] Atrial fibrillation with rapid ventricular response (HCC) [I48.91] Incarcerated hernia [K46.0] Patient Active Problem List   Diagnosis Date Noted   Atrial fibrillation with rapid ventricular response ([place]) 05/02/2021   SBO (small bowel obstruction) ([place]) 05/02/2021   Leukocytosis 05/02/2021   Hyperlipidemia 05/02/2021   Thoracic aortic aneurysm ([place]) 05/02/2021   Incarcerated hernia    PCP:  Josetta Huddle, MD Pharmacy[id number] [place] [id number]: [phone]: [phone]    Social Determinants of Health (SDOH) Interventions    Readmission Risk Interventions No flowsheet data found.

## 2021-05-07 NOTE — Progress Notes (Signed)
5 Days Post-Op  Subjective: Had very small BM yesterday, no flatus. Denies abdominal pain. XR this morning shows contrast in colon.    Objective: Vital signs in last 24 hours: Temp:  [97.8 F (36.6 C)-98.9 F (37.2 C)] 98.1 F (36.7 C) (08/10 0327) Pulse Rate:  [74-91] 78 (08/10 0327) Resp:  [17-20] 20 (08/10 0327) BP: (101-129)/(66-77) 123/69 (08/10 0327) SpO2:  [91 %-98 %] 98 % (08/10 0327) Last BM Date: 05/05/21  Intake/Output from previous day: 08/09 0701 - 08/10 0700 In: 2183.1 [I.V.:1708.4; IV Piggyback:474.7] Out: 2200 [Urine:650; Emesis/NG output:1550] Intake/Output this shift: No intake/output data recorded.  PE: General: resting comfortably, NAD Neuro: alert and oriented, no focal deficits HEENT: NG in place to suction with light bilious drainage CV: irregular rhythm Abd: mildly distended, very soft and nontender. Right inguinal incision clean and dry.  Lab Results:  Recent Labs    05/06/21 0451  WBC 6.9  HGB 14.5  HCT 43.0  PLT 180   BMET Recent Labs    05/05/21 0152 05/06/21 0451  NA 135 134*  K 3.2* 3.4*  CL 96* 104  CO2 30 27  GLUCOSE 98 88  BUN 39* 24*  CREATININE 1.17 0.93  CALCIUM 8.6* 7.7*   PT/INR No results for input(s): LABPROT, INR in the last 72 hours. CMP     Component Value Date/Time   NA 134 (L) 05/06/2021 0451   K 3.4 (L) 05/06/2021 0451   CL 104 05/06/2021 0451   CO2 27 05/06/2021 0451   GLUCOSE 88 05/06/2021 0451   BUN 24 (H) 05/06/2021 0451   CREATININE 0.93 05/06/2021 0451   CALCIUM 7.7 (L) 05/06/2021 0451   PROT 8.0 05/02/2021 1806   ALBUMIN 4.1 05/02/2021 1806   AST 31 05/02/2021 1806   ALT 20 05/02/2021 1806   ALKPHOS 63 05/02/2021 1806   BILITOT 1.3 (H) 05/02/2021 1806   GFRNONAA >60 05/06/2021 0451   GFRAA  08/14/2007 0312    >60        The eGFR has been calculated using the MDRD equation. This calculation has not been validated in all clinical   Lipase     Component Value Date/Time   LIPASE  27 05/02/2021 1806       Studies/Results: CT ABDOMEN PELVIS WO CONTRAST  Result Date: 05/05/2021 CLINICAL DATA:  Bowel obstruction suspected EXAM: CT ABDOMEN AND PELVIS WITHOUT CONTRAST TECHNIQUE: Multidetector CT imaging of the abdomen and pelvis was performed following the standard protocol without IV contrast. COMPARISON:  CT 05/02/2021 FINDINGS: Lower chest: Partially visualized ascending aorta measures 4.4 cm, this measured up to 4.8 cm on prior dedicated CTA of the chest. Small right and trace left pleural effusions with adjacent right basilar atelectasis. Coronary artery calcifications. Hepatobiliary: Atrophic left hepatic lobe. Layering high density in the gallbladder, likely hyperdense sludge and small stones. Pancreas: Unremarkable. No pancreatic ductal dilatation or surrounding inflammatory changes. Spleen: Normal in size without focal abnormality. Adrenals/Urinary Tract: Adrenal glands are unremarkable. No hydronephrosis. Unchanged left renal cysts. Unchanged right renal cyst. No nephrolithiasis. Unchanged bladder dome diverticulum. Stomach/Bowel: There is a nasogastric tube with tip in the stomach. The stomach is unremarkable. There are multiple dilated loops of small bowel, with transition point in the left lower quadrant anteriorly (series 3, image 58 and series 6, image 36). There is a large rectal stool ball. Vascular/Lymphatic: Aortoiliac atherosclerotic calcifications. No AAA. Reproductive: Enlarged prostate. Other: There is a left inguinal hernia containing small bowel. Postsurgical changes of recent open right  inguinal hernia repair. There is no evidence of residual bowel in the right inguinal canal. There are postsurgical inflammatory changes in this region and small foci of gas. Musculoskeletal: Prior right hip arthroplasty. No acute osseous abnormality. Dextroconvex curvature of the lumbar spine. Multilevel degenerative changes of the spine, no acute osseous abnormality. No suspicious  lytic or blastic lesions. IMPRESSION: Persistent small bowel obstruction, with transition point in the left lower quadrant anteriorly, possibly due to adhesions. Postsurgical changes of recent open right inguinal hernia repair without recurrence. Unchanged small bowel containing left inguinal hernia, which appears unrelated to the current bowel obstruction. Unchanged small right and trace left pleural effusions. Unchanged layering high density in the gallbladder, likely hyperdense sludge and small stones. Electronically Signed   By: Maurine Simmering   On: 05/05/2021 16:17   DG Abd Portable 1V  Result Date: 05/07/2021 CLINICAL DATA:  Obstruction EXAM: PORTABLE ABDOMEN - 1 VIEW COMPARISON:  Portable exam 0525 hours compared to 05/06/2021 FINDINGS: Nasogastric tube projects over proximal stomach. Retained contrast in decompressed RIGHT colon. Numerous air-filled distended loops of small bowel throughout abdomen consistent with persistent small-bowel obstruction. Some gas is present in the distal sigmoid colon and rectum. No bowel wall thickening. Degenerative disc disease changes and dextroconvex scoliosis lumbar spine. Osseous demineralization and RIGHT hip prosthesis noted. IMPRESSION: Persistent small bowel obstruction Electronically Signed   By: Lavonia Dana M.D.   On: 05/07/2021 08:18   DG Abd Portable 1V  Result Date: 05/06/2021 CLINICAL DATA:  Small-bowel obstruction EXAM: PORTABLE ABDOMEN - 1 VIEW COMPARISON:  Portable exam 0844 hours compared to 05/05/2021 FINDINGS: Air-filled distended small bowel loops in the abdomen consistent with small bowel obstruction. Loops are in general little changed in diameter versus previous exam. Small amount retained contrast in RIGHT colon. No definite bowel wall thickening. Bones demineralized with degenerative changes and scoliosis of lumbar spine as well as note of a RIGHT hip prosthesis. IMPRESSION: Persistent small bowel obstruction. Electronically Signed   By: Lavonia Dana M.D.   On: 05/06/2021 10:26   DG Abd Portable 1V  Result Date: 05/05/2021 CLINICAL DATA:  86 year old male with right inguinal hernia related SBO. EXAM: PORTABLE ABDOMEN - 1 VIEW COMPARISON:  CT Abdomen and Pelvis 05/02/2021.  KUB 05/03/2021. FINDINGS: Portable AP supine view at 0912 hours. Enteric tube placed, side hole at the level of the GE J. Decompressed stomach compared to 05/03/2021. But small-bowel obstruction gas pattern persists. Gas-filled small bowel loops continue to measure up to 40 mm in the central abdomen. Paucity of large bowel gas as before. Stable visualized osseous structures. IMPRESSION: 1. Enteric tube placed, side hole at the level of the GEJ. Advance 5 cm to ensure side hole placement within the stomach. 2. Continued small bowel obstruction gas pattern, not improved since 05/02/2021. Electronically Signed   By: Genevie Ann M.D.   On: 05/05/2021 10:00    Anti-infectives: Anti-infectives (From admission, onward)    None        Assessment/Plan POD 5, s/p open right inguinal hernia repair with mesh by Dr. Bobbye Morton for incarcerated RIH with persistent ileus vs SBO. -XR this morning shows small bowel is still distended, however there is gas throughout the colon and contrast is now in the right colon, suggesting there is not a complete obstruction. -Patient had a large stool burden on CT a few days ago, will given an enema today -mobilize  -pulm toilet/IS -pain control, multi-modal   FEN - NPO/NGT/IVFs/ replace K again today due to  still 3.4 VTE - Lovenox ID - none  New onset a fib Ascending thoracic aneurysm   LOS: 5 days    Dwan Bolt , MD [facility] Surgery 05/07/2021, 8:21 AM Please see Amion for pager number during day hours 7:00am-4:30pm or 7:00am -11:30am on weekends

## 2021-05-07 NOTE — Progress Notes (Signed)
   05/07/21 1814  What Happened  Was patient injured? No  Patient found on floor  Found by Staff-comment (Thoms Barthelemy RN)  Stated prior activity bathroom-unassisted  Follow Up  MD notified dr. Avon Gully  Time MD notified [phone]  Family notified No - patient refusal  Additional tests No  Simple treatment Other (comment) (none)  Progress note created (see row info) Yes  Adult Fall Risk Assessment  Risk Factor Category (scoring not indicated) Not Applicable  Age 87  Fall History: Fall within 6 months prior to admission 0  Elimination; Bowel and/or Urine Incontinence 0  Elimination; Bowel and/or Urine Urgency/Frequency 2  Medications: includes PCA/Opiates, Anti-convulsants, Anti-hypertensives, Diuretics, Hypnotics, Laxatives, Sedatives, and Psychotropics 3  Patient Care Equipment 1  Mobility-Assistance 2  Mobility-Gait 0  Mobility-Sensory Deficit 0  Altered awareness of immediate physical environment 0  Impulsiveness 0  Lack of understanding of one's physical/cognitive limitations 0  Total Score 11  Patient Fall Risk Level Moderate fall risk  Adult Fall Risk Interventions  Required Bundle Interventions *See Row Information* Moderate fall risk - low and moderate requirements implemented  Additional Interventions Use of appropriate toileting equipment (bedpan, BSC, etc.)  Screening for Fall Injury Risk (To be completed on HIGH fall risk patients) - Assessing Need for Floor Mats  Risk For Fall Injury- Criteria for Floor Mats 85 years or older  Will Implement Floor Mats Yes  Vitals  Temp 97.8 F (36.6 C)  Temp Source Oral  BP 134/72  MAP (mmHg) 90  BP Location Right Arm  BP Method Automatic  Patient Position (if appropriate) Lying  Pulse Rate (!) 102  Pulse Rate Source Monitor  ECG Heart Rate (!) 102  Resp 20  Oxygen Therapy  SpO2 95 %  O2 Device Room Air  Pain Assessment  Pain Scale 0-10  Pain Score 0  PCA/Epidural/Spinal Assessment  Respiratory Pattern Regular;Unlabored   Neurological  Neuro (WDL) WDL  Level of Consciousness Alert  Orientation Level Oriented X4  Cognition Appropriate at baseline  Speech Clear  Motor Function/Sensation Assessment Grip  R Hand Grip Present  L Hand Grip Present  RLE Motor Strength 4  LLE Motor Strength 4  Glasgow Coma Scale  Eye Opening 4  Best Verbal Response (NON-intubated) 5  Best Motor Response 6  Glasgow Coma Scale Score 15  Musculoskeletal  Musculoskeletal (WDL) X  Assistive Device BSC;Front wheel walker  Weight Bearing Restrictions No  Integumentary  Integumentary (WDL) X  Skin Color Appropriate for ethnicity  Skin Condition Dry  Skin Integrity Surgical Incision (see LDA)  Skin Turgor Non-tenting  Pain Assessment  Date Pain First Started  (n/a)  Result of Injury No  Pain Screening  Response to Interventions no pain

## 2021-05-08 DIAGNOSIS — I4891 Unspecified atrial fibrillation: Secondary | ICD-10-CM | POA: Diagnosis not present

## 2021-05-08 LAB — BASIC METABOLIC PANEL
Anion gap: 11 (ref 5–15)
BUN: 16 mg/dL (ref 8–23)
CO2: 22 mmol/L (ref 22–32)
Calcium: 8.2 mg/dL — ABNORMAL LOW (ref 8.9–10.3)
Chloride: 101 mmol/L (ref 98–111)
Creatinine, Ser: 0.81 mg/dL (ref 0.61–1.24)
GFR, Estimated: >60 mL/min (ref >60)
Glucose, Bld: 83 mg/dL (ref 70–99)
Potassium: 3.5 mmol/L (ref 3.5–5.1)
Sodium: 134 mmol/L — ABNORMAL LOW (ref 135–145)

## 2021-05-08 LAB — CBC
HCT: 38.9 % — ABNORMAL LOW (ref 39.0–52.0)
Hemoglobin: 12.7 g/dL — ABNORMAL LOW (ref 13.0–17.0)
MCH: 30.7 pg (ref 26.0–34.0)
MCHC: 32.6 g/dL (ref 30.0–36.0)
MCV: 94 fL (ref 80.0–100.0)
Platelets: 206 10*3/uL (ref 150–400)
RBC: 4.14 MIL/uL — ABNORMAL LOW (ref 4.22–5.81)
RDW: 12.9 % (ref 11.5–15.5)
WBC: 9.8 10*3/uL (ref 4.0–10.5)
nRBC: 0 % (ref 0.0–0.2)

## 2021-05-08 MED ORDER — DIATRIZOATE MEGLUMINE & SODIUM 66-10 % PO SOLN
90.0000 mL | Freq: Once | ORAL | Status: AC
  Administered 2021-05-08: 90 mL via ORAL
  Filled 2021-05-08: qty 90

## 2021-05-08 NOTE — Therapy (Signed)
Occupational Therapy Treatment Patient Details Name: Gary Rice MRN: [id number] DOB: 1931-04-25 Today's Date: 05/08/2021    History of present illness 87 yo male admitted 8/522 after being referred to ED by PCP secondary to incarcerated inguinal hernia causing SBO, also found to be in Afib with RVR in the ED. Received surgical repair of incarcerated inguinal hernia 05/02/21. PMH hernia repair joint replacement.   OT comments  Pt was seen for OT ADL retraining session today with focus on functional mobility and transfers. He is overall Min guard to close supervision for transfers. Pt is making nice gains toward OT goals and is hopeful to begin attempting liquid diet when cleared to do so "later today".   Follow Up Recommendations  Home health OT;Supervision - Intermittent    Equipment Recommendations  Other (comment) (RW)    Recommendations for Other Services      Precautions / Restrictions Precautions Precautions: Fall Precaution Comments: s/p inguinal hernia repair, NG tube to wall suction, watch SPO2 and HR       Mobility Bed Mobility Overal bed mobility: Needs Assistance Bed Mobility: Supine to Sit     Supine to sit: Supervision;HOB elevated     General bed mobility comments: No physical assist, Pt utilizes abdominal muscles heavily with poor technique,  he defers staff/therapist assist    Transfers Overall transfer level: Needs assistance Equipment used: Rolling walker (2 wheeled) Transfers: Sit to/from [facility] Sit to Stand: Min guard Stand pivot transfers: Supervision    Balance Overall balance assessment: Modified Independent Sitting-balance support: Feet supported Sitting balance-Leahy Scale: Good     Standing balance support: Bilateral upper extremity supported;During functional activity Standing balance-Leahy Scale: Fair Standing balance comment: No external assist required, using RW for support.     ADL either performed or  assessed with clinical judgement   ADL Overall ADL's : Needs assistance/impaired Eating/Feeding: NPO (Pt states that he may be able to attempt liquids later today)   Grooming: Set up;Modified independent;Sitting       Toilet Transfer: Min guard;Ambulation;RW (Simulated toilet transfer from bed to recliner chair in room today. Min guard assist, no hands on assist required. Pt is reliant on bilateral UE support on RW. Note Pt reports fall yesterday in room.) Toilet Transfer Details (indicate cue type and reason): See notes above Toileting- Clothing Manipulation and Hygiene: Sitting/lateral lean;Modified independent;Supervision/safety       Functional mobility during ADLs: Min guard;Rolling walker General ADL Comments: Pt performing bed mobility and functional transfers related to ADL's w/ overall min guard assist, no hands on assist. Pt is hopeful to attempt liquid diet later today if medically cleared to do so.    Cognition Arousal/Alertness: Awake/alert Behavior During Therapy: WFL for tasks assessed/performed Overall Cognitive Status: Within Functional Limits for tasks assessed     General Comments: pleasant throughout                   Pertinent Vitals/ Pain       Pain Assessment: No/denies pain Pain Score: 0-No pain   Frequency  Min 2X/week        Progress Toward Goals  OT Goals(current goals can now be found in the care plan section)  Progress towards OT goals: Progressing toward goals  Acute Rehab OT Goals Patient Stated Goal: Get NG tube out and try food OT Goal Formulation: With patient Time For Goal Achievement: 05/18/21 Potential to Achieve Goals: Good  Plan Discharge plan remains appropriate;Frequency remains appropriate  AM-PAC OT "6 Clicks" Daily Activity     Outcome Measure   Help from another person eating meals?: None (NPO, pt will be able to feed himself) Help from another person taking care of personal grooming?: A Little Help from  another person toileting, which includes using toliet, bedpan, or urinal?: A Little Help from another person bathing (including washing, rinsing, drying)?: A Little Help from another person to put on and taking off regular upper body clothing?: A Little Help from another person to put on and taking off regular lower body clothing?: A Little 6 Click Score: 19    End of Session Equipment Utilized During Treatment: Rolling walker  OT Visit Diagnosis: Unsteadiness on feet (R26.81);Other abnormalities of gait and mobility (R26.89);Muscle weakness (generalized) (M62.81)   Activity Tolerance Patient tolerated treatment well   Patient Left in chair;with call bell/phone within reach;with chair alarm set           Time: [id number] OT Time Calculation (min): 18 min  Charges: OT General Charges $OT Visit: 1 Visit OT Treatments $Self Care/Home Management : 8-22 mins   Estrellita Lasky Beth Dixon, OTR/L 05/08/2021, 12:45 PM

## 2021-05-08 NOTE — Plan of Care (Signed)
  Problem: Education: Goal: Knowledge of General Education information will improve Description Including pain rating scale, medication(s)/side effects and non-pharmacologic comfort measures Outcome: Progressing   

## 2021-05-08 NOTE — Progress Notes (Signed)
PROGRESS NOTE    Gary Rice [facility]  [id number] DOB: 07/15/1931 DOA: 05/02/2021 PCP: Josetta Huddle, MD   Brief Narrative:  Gary Rice is a 87 y.o. male with medical history significant of SVT, hyperlipidemia, appendectomy, bilateral inguinal hernias, previous hernia repair presented to the ED complaining of vomiting.  He reported that his right inguinal hernia had popped and he was sent by his doctor for evaluation of possible bowel obstruction.  In the ED, patient found to be in new onset A. fib with rate in the 160s. CT showing findings consistent with mechanical SBO with transition point related to right inguinal hernia; no perforation.  Moderate fat and small bowel containing left inguinal hernia without adverse features.  ED physician was not able to reduce the inguinal hernia.  General surgery recommended NG tube - given concern for incarcerated RIGHT inguinal hernia patient was taken to OR for repair with mesh overnight 05/03/21.  Assessment & Plan:   Principal Problem:   Atrial fibrillation with rapid ventricular response (HCC) Active Problems:   SBO (small bowel obstruction) (HCC)   Leukocytosis   Hyperlipidemia   Thoracic aortic aneurysm (HCC)  Acute intractable nausea vomiting, resolving - Repeat imaging shows contrast in the colon, hopefully indicative of resolving small bowel obstruction as below - NG tube clamping trial today per surgery, trial clear liquids and follow - Transition all meds to IV given ongoing profound NG output  New onset A. fib with RVR, likely provoked given below, moderately well controlled - Cardiology consulted now improving IV Cardizem drip, IV metoprolol 5 mg every 4-6 hours PRN - CHA2DS2-VASc 2 based on age (no clear history of hypertension and not currently on meds).  Patient is currently going to the OR on an urgent basis.  Cardiology recommending starting Eliquis 5 mg twice daily for anticoagulation when safe from surgical standpoint or  prior to discharge. - IV fluids and monitor blood pressure - TSH WNL - Echo - EF 60-65%  SBO with incarcerated R inguinal hernia - Gen Sx following - urgent repair overnight 05/03/21 with mesh - Repeat CT shows ongoing SBO in LLQ with transition point - defer to surgery as this is not anatomically near previous procedure -repeat imaging shows contrast in the colon as above - Pain control, diet advancement, and ambulation status per surgery -Flatus and small bowel movement overnight, NG clamping trial today as above   Leukocytosis, likely reactive given above, resolved - WBC downtrending appropriately with supportive care/surgery - WNL now   Hyperlipidemia -Resume statin when able to take p.o.   Ascending thoracic aortic aneurysm - CT showing aneurysmal dilatation of the ascending aorta up to 4.8 cm. -Radiologist recommending semiannual imaging follow-up by CTA or MRA -Will need cardiothoracic surgery follow-up outpatient per protocol   DVT prophylaxis: SCDs Code Status: FULL Family Communication: Over the phone  Status is: Inpt  Dispo: The patient is from: Home              Anticipated d/c is to: Home with home health PT              Anticipated d/c date is: 48-72h              Patient currently NOT medically stable for discharge  Consultants:  General Surgery, Cardiology  Procedures:  Hernia/mesh repair 05/03/21  Antimicrobials:  None   Subjective: Slip and fall at bedside last night attempting to use bedside commode without injury, patient was able to lower himself but lost footing  getting out of bed.  Patient notes small bowel movement and ongoing flatus overnight, excited for NG clamping trial today.  Review of systems otherwise unremarkable.  Objective: Vitals:   05/07/21 1814 05/07/21 1955 05/07/21 2312 05/08/21 0400  BP: 134/72 137/79 133/71 116/71  Pulse: (!) 102 80 86 82  Resp: '20 17 20 20  '$ Temp: 97.8 F (36.6 C) 98.1 F (36.7 C) 98.3 F (36.8 C) 98.6 F (37  C)  TempSrc: Oral Oral Oral Oral  SpO2: 95% 96% 92% 91%  Weight:      Height:        Intake/Output Summary (Last 24 hours) at 05/08/2021 0813 Last data filed at 05/08/2021 0600 Gross per 24 hour  Intake 2801.23 ml  Output 750 ml  Net 2051.23 ml    Filed Weights   05/02/21 1805  Weight: 77.1 kg    Examination:  General exam: Appears calm and comfortable  Respiratory system: Clear to auscultation. Respiratory effort normal. Cardiovascular system: S1 & S2 heard, RRR. No JVD, murmurs, rubs, gallops or clicks. No pedal edema. Gastrointestinal system: Abdomen is moderately distended, soft and mildly tender. No organomegaly or masses felt. NG tube to LIS -draining minimal dark green fluid. Central nervous system: Alert and oriented. No focal neurological deficits. Extremities: Symmetric 5 x 5 power. Skin: No rashes, lesions or ulcers Psychiatry: Judgement and insight appear normal. Mood & affect appropriate.   Data Reviewed: I have personally reviewed following labs and imaging studies  CBC: Recent Labs  Lab 05/02/21 1806 05/02/21 1814 05/03/21 0150 05/04/21 0540 05/06/21 0451  WBC 18.0*  --  12.3* 6.2 6.9  NEUTROABS 13.7*  --   --   --   --   HGB 16.8 17.0 13.0 13.9 14.5  HCT 49.2 50.0 38.6* 41.1 43.0  MCV 92.7  --  94.1 92.4 92.9  PLT 226  --  179 159 [id number]    Basic Metabolic Panel: Recent Labs  Lab 05/02/21 1806 05/02/21 1814 05/03/21 0150 05/04/21 0540 05/05/21 0152 05/06/21 0451 05/08/21 0621  NA 132*   < > 135 136 135 134* 134*  K 4.1   < > 3.7 3.2* 3.2* 3.4* 3.5  CL 96*   < > 99 96* 96* 104 101  CO2 24  --  '27 29 30 27 22  '$ GLUCOSE 124*   < > 126* 108* 98 88 83  BUN 21   < > 18 27* 39* 24* 16  CREATININE 1.03   < > 1.09 1.06 1.17 0.93 0.81  CALCIUM 10.3  --  8.8* 8.8* 8.6* 7.7* 8.2*  MG 2.1  --   --   --   --  1.9  --    < > = values in this interval not displayed.    GFR: Estimated Creatinine Clearance: 67.1 mL/min (by C-G formula based on SCr of  0.81 mg/dL). Liver Function Tests: Recent Labs  Lab 05/02/21 1806  AST 31  ALT 20  ALKPHOS 63  BILITOT 1.3*  PROT 8.0  ALBUMIN 4.1    Recent Labs  Lab 05/02/21 1806  LIPASE 27    No results for input(s): AMMONIA in the last 168 hours. Coagulation Profile: No results for input(s): INR, PROTIME in the last 168 hours. Cardiac Enzymes: No results for input(s): CKTOTAL, CKMB, CKMBINDEX, TROPONINI in the last 168 hours. BNP (last 3 results) No results for input(s): PROBNP in the last 8760 hours. HbA1C: No results for input(s): HGBA1C in the last 72 hours. CBG: Recent Labs  Lab 05/05/21 2133  GLUCAP 96    Lipid Profile: No results for input(s): CHOL, HDL, LDLCALC, TRIG, CHOLHDL, LDLDIRECT in the last 72 hours. Thyroid Function Tests: No results for input(s): TSH, T4TOTAL, FREET4, T3FREE, THYROIDAB in the last 72 hours.  Anemia Panel: No results for input(s): VITAMINB12, FOLATE, FERRITIN, TIBC, IRON, RETICCTPCT in the last 72 hours. Sepsis Labs: No results for input(s): PROCALCITON, LATICACIDVEN in the last 168 hours.  Recent Results (from the past 240 hour(s))  Resp Panel by RT-PCR (Flu A&B, Covid) Nasopharyngeal Swab     Status: None   Collection Time: 05/02/21  9:40 PM   Specimen: Nasopharyngeal Swab; Nasopharyngeal(NP) swabs in vial transport medium  Result Value Ref Range Status   SARS Coronavirus 2 by RT PCR NEGATIVE NEGATIVE Final    Comment: (NOTE) SARS-CoV-2 target nucleic acids are NOT DETECTED.  The SARS-CoV-2 RNA is generally detectable in upper respiratory specimens during the acute phase of infection. The lowest concentration of SARS-CoV-2 viral copies this assay can detect is 138 copies/mL. A negative result does not preclude SARS-Cov-2 infection and should not be used as the sole basis for treatment or other patient management decisions. A negative result may occur with  improper specimen collection/handling, submission of specimen other than  nasopharyngeal swab, presence of viral mutation(s) within the areas targeted by this assay, and inadequate number of viral copies(<138 copies/mL). A negative result must be combined with clinical observations, patient history, and epidemiological information. The expected result is Negative.  Fact Sheet for Patients:  EntrepreneurPulse.com.au  Fact Sheet for Healthcare Providers:  IncredibleEmployment.be  This test is no t yet approved or cleared by the [place] FDA and  has been authorized for detection and/or diagnosis of SARS-CoV-2 by FDA under an Emergency Use Authorization (EUA). This EUA will remain  in effect (meaning this test can be used) for the duration of the COVID-19 declaration under Section 564(b)(1) of the Act, 21 U.S.C.section 360bbb-3(b)(1), unless the authorization is terminated  or revoked sooner.       Influenza A by PCR NEGATIVE NEGATIVE Final   Influenza B by PCR NEGATIVE NEGATIVE Final    Comment: (NOTE) The Xpert Xpress SARS-CoV-2/FLU/RSV plus assay is intended as an aid in the diagnosis of influenza from Nasopharyngeal swab specimens and should not be used as a sole basis for treatment. Nasal washings and aspirates are unacceptable for Xpert Xpress SARS-CoV-2/FLU/RSV testing.  Fact Sheet for Patients: EntrepreneurPulse.com.au  Fact Sheet for Healthcare Providers: IncredibleEmployment.be  This test is not yet approved or cleared by the [place] FDA and has been authorized for detection and/or diagnosis of SARS-CoV-2 by FDA under an Emergency Use Authorization (EUA). This EUA will remain in effect (meaning this test can be used) for the duration of the COVID-19 declaration under Section 564(b)(1) of the Act, 21 U.S.C. section 360bbb-3(b)(1), unless the authorization is terminated or revoked.  Performed at [facility], [place] [address], [place], [place] [address]            Radiology Studies: DG Abd Portable 1V  Result Date: 05/07/2021 CLINICAL DATA:  Obstruction EXAM: PORTABLE ABDOMEN - 1 VIEW COMPARISON:  Portable exam 0525 hours compared to 05/06/2021 FINDINGS: Nasogastric tube projects over proximal stomach. Retained contrast in decompressed RIGHT colon. Numerous air-filled distended loops of small bowel throughout abdomen consistent with persistent small-bowel obstruction. Some gas is present in the distal sigmoid colon and rectum. No bowel wall thickening. Degenerative disc disease changes and dextroconvex scoliosis lumbar spine. Osseous demineralization  and RIGHT hip prosthesis noted. IMPRESSION: Persistent small bowel obstruction Electronically Signed   By: Lavonia Dana M.D.   On: 05/07/2021 08:18   DG Abd Portable 1V  Result Date: 05/06/2021 CLINICAL DATA:  Small-bowel obstruction EXAM: PORTABLE ABDOMEN - 1 VIEW COMPARISON:  Portable exam 0844 hours compared to 05/05/2021 FINDINGS: Air-filled distended small bowel loops in the abdomen consistent with small bowel obstruction. Loops are in general little changed in diameter versus previous exam. Small amount retained contrast in RIGHT colon. No definite bowel wall thickening. Bones demineralized with degenerative changes and scoliosis of lumbar spine as well as note of a RIGHT hip prosthesis. IMPRESSION: Persistent small bowel obstruction. Electronically Signed   By: Lavonia Dana M.D.   On: 05/06/2021 10:26    Scheduled Meds:  acetaminophen (TYLENOL) oral liquid 160 mg/5 mL  1,000 mg Per Tube Q6H   enoxaparin (LOVENOX) injection  40 mg Subcutaneous Q24H   pantoprazole (PROTONIX) IV  40 mg Intravenous Q24H   Continuous Infusions:  diltiazem (CARDIZEM) infusion 10 mg/hr (05/08/21 0600)   lactated ringers 70 mL/hr at 05/08/21 0600   methocarbamol (ROBAXIN) IV     promethazine (PHENERGAN) injection (IM or IVPB) 70 mL/hr at 05/04/21 0230    LOS: 6 days   Time spent: 4mn  Margurette Brener C Wilbert Schouten,  DO [facility]  If 7PM-7AM, please contact night-coverage [email or web address]  05/08/2021, 8:13 AM

## 2021-05-08 NOTE — Progress Notes (Signed)
Physical Therapy Treatment Patient Details Name: Gary Rice MRN: [id number] DOB: 1931-01-18 Today's Date: 05/08/2021    History of Present Illness 87 yo male admitted 8/522 after being referred to ED by PCP secondary to incarcerated inguinal hernia causing SBO, also found to be in Afib with RVR in the ED. Received surgical repair of incarcerated inguinal hernia 05/02/21. PMH hernia repair joint replacement.    PT Comments    Pt continues to make good progress and is on target to return home at [place].    Follow Up Recommendations  Home health PT     Equipment Recommendations  Rolling walker with 5" wheels    Recommendations for Other Services       Precautions / Restrictions Precautions Precautions: Fall;Other (comment) Precaution Comments: NG tube    Mobility  Bed Mobility Overal bed mobility: (P) Needs Assistance Bed Mobility: (P) Sit to Supine       Sit to supine: (P) Supervision        Transfers Overall transfer level: Needs assistance Equipment used: Rolling walker (2 wheeled) Transfers: Sit to/from Stand Sit to Stand: Min guard         General transfer comment: Assist for safety and lines. Pt uses momentum to assist with rise to stand.  Ambulation/Gait Ambulation/Gait assistance: Supervision Gait Distance (Feet): 470 Feet Assistive device: Rolling walker (2 wheeled) Gait Pattern/deviations: Step-through pattern;Decreased stride length Gait velocity: decr Gait velocity interpretation: 1.31 - 2.62 ft/sec, indicative of limited community ambulator General Gait Details: Supervision for safety and lines   Stairs             Wheelchair Mobility    Modified Rankin (Stroke Patients Only)       Balance Overall balance assessment: Needs assistance Sitting-balance support: Feet supported;No upper extremity supported Sitting balance-Leahy Scale: Good     Standing balance support: During functional activity;No upper extremity  supported Standing balance-Leahy Scale: Fair                              Cognition Arousal/Alertness: Awake/alert Behavior During Therapy: WFL for tasks assessed/performed Overall Cognitive Status: Within Functional Limits for tasks assessed                                        Exercises      General Comments        Pertinent Vitals/Pain Pain Assessment: No/denies pain    Home Living                      Prior Function            PT Goals (current goals can now be found in the care plan section) Acute Rehab PT Goals Patient Stated Goal: Get NG tube out and try food PT Goal Formulation: With patient Time For Goal Achievement: 05/15/21 Potential to Achieve Goals: Good Progress towards PT goals: Goals met and updated - see care plan    Frequency    Min 3X/week      PT Plan Current plan remains appropriate    Co-evaluation              AM-PAC PT "6 Clicks" Mobility   Outcome Measure  Help needed turning from your back to your side while in a flat bed without using bedrails?: A Little Help needed moving from  lying on your back to sitting on the side of a flat bed without using bedrails?: A Little Help needed moving to and from a bed to a chair (including a wheelchair)?: A Little Help needed standing up from a chair using your arms (e.g., wheelchair or bedside chair)?: A Little Help needed to walk in hospital room?: A Little Help needed climbing 3-5 steps with a railing? : A Little 6 Click Score: 18    End of Session Equipment Utilized During Treatment: Other (comment) (Pt refused gait belt) Activity Tolerance: Patient tolerated treatment well Patient left: in bed;with bed alarm set;with call bell/phone within reach Nurse Communication: Mobility status PT Visit Diagnosis: Muscle weakness (generalized) (M62.81);Other abnormalities of gait and mobility (R26.89)     Time: 0623-7628 PT Time Calculation (min)  (ACUTE ONLY): 22 min  Charges:  $Gait Training: 8-22 mins                     [place] Pager [phone] Office [place] 05/08/2021, 4:56 PM

## 2021-05-08 NOTE — Progress Notes (Signed)
6 Days Post-Op  Subjective: Had a small BM yesterday and some flatus with BM only.  NGT output decrease to 550 but still bilious.  No new complaints.    Objective: Vital signs in last 24 hours: Temp:  [97.4 F (36.3 C)-98.6 F (37 C)] 98.3 F (36.8 C) (08/11 0832) Pulse Rate:  [73-102] 73 (08/11 0832) Resp:  [15-20] 20 (08/11 0832) BP: (116-137)/(56-79) 129/69 (08/11 0832) SpO2:  [91 %-96 %] 94 % (08/11 0832) Last BM Date: 05/05/21  Intake/Output from previous day: 08/10 0701 - 08/11 0700 In: 2801.2 [P.O.:120; I.V.:2651.2; NG/GT:30] Out: 36 [Urine:500; Emesis/NG output:550] Intake/Output this shift: Total I/O In: 0  Out: 300 [Urine:300]  PE: CV: irregular rhythm Abd: mildly distended, very soft and nontender. Right inguinal incision clean and dry.  NGT with 550cc of bilious output  Lab Results:  Recent Labs    05/06/21 0451 05/08/21 0621  WBC 6.9 9.8  HGB 14.5 12.7*  HCT 43.0 38.9*  PLT 180 206   BMET Recent Labs    05/06/21 0451 05/08/21 0621  NA 134* 134*  K 3.4* 3.5  CL 104 101  CO2 27 22  GLUCOSE 88 83  BUN 24* 16  CREATININE 0.93 0.81  CALCIUM 7.7* 8.2*   PT/INR No results for input(s): LABPROT, INR in the last 72 hours. CMP     Component Value Date/Time   NA 134 (L) 05/08/2021 0621   K 3.5 05/08/2021 0621   CL 101 05/08/2021 0621   CO2 22 05/08/2021 0621   GLUCOSE 83 05/08/2021 0621   BUN 16 05/08/2021 0621   CREATININE 0.81 05/08/2021 0621   CALCIUM 8.2 (L) 05/08/2021 0621   PROT 8.0 05/02/2021 1806   ALBUMIN 4.1 05/02/2021 1806   AST 31 05/02/2021 1806   ALT 20 05/02/2021 1806   ALKPHOS 63 05/02/2021 1806   BILITOT 1.3 (H) 05/02/2021 1806   GFRNONAA >60 05/08/2021 0621   GFRAA  08/14/2007 0312    >60        The eGFR has been calculated using the MDRD equation. This calculation has not been validated in all clinical   Lipase     Component Value Date/Time   LIPASE 27 05/02/2021 1806       Studies/Results: DG Abd  Portable 1V  Result Date: 05/07/2021 CLINICAL DATA:  Obstruction EXAM: PORTABLE ABDOMEN - 1 VIEW COMPARISON:  Portable exam 0525 hours compared to 05/06/2021 FINDINGS: Nasogastric tube projects over proximal stomach. Retained contrast in decompressed RIGHT colon. Numerous air-filled distended loops of small bowel throughout abdomen consistent with persistent small-bowel obstruction. Some gas is present in the distal sigmoid colon and rectum. No bowel wall thickening. Degenerative disc disease changes and dextroconvex scoliosis lumbar spine. Osseous demineralization and RIGHT hip prosthesis noted. IMPRESSION: Persistent small bowel obstruction Electronically Signed   By: Lavonia Dana M.D.   On: 05/07/2021 08:18    Anti-infectives: Anti-infectives (From admission, onward)    None        Assessment/Plan POD 6, s/p open right inguinal hernia repair with mesh by Dr. Bobbye Morton for incarcerated RIH with persistent ileus vs SBO. -XR this morning shows small bowel is still distended, however there is gas throughout the colon and contrast is now in the right colon, suggesting there is not a complete obstruction. -had some stool yesterday and small amount of flatus, but still with bilious NGT output. -clamp NGT today and give clear liquids along with gastrografin to help as a catharsis. -if he fails this  then will plan dx lap tomorrow  -mobilize  -pulm toilet/IS -pain control, multi-modal   FEN - NPO clamped, CLD VTE - Lovenox ID - none  New onset a fib Ascending thoracic aneurysm   LOS: 6 days    Henreitta Cea , MD [facility] 05/08/2021, 10:24 AM Please see Amion for pager number during day hours 7:00am-4:30pm or 7:00am -11:30am on weekends

## 2021-05-08 NOTE — Progress Notes (Addendum)
Progress Note  Patient Name: Gary Rice [facility] Date of Encounter: 05/08/2021  [facility] Cardiologist: Candee Furbish, MD   Subjective   Patient states he is now passing gas, denied any chest pain, heart palpitation, dizziness, and had a very small BM yesterday. He states surgery team is planning on clamp the NG and let him try some fluid today.   Inpatient Medications    Scheduled Meds:  acetaminophen (TYLENOL) oral liquid 160 mg/5 mL  1,000 mg Per Tube Q6H   enoxaparin (LOVENOX) injection  40 mg Subcutaneous Q24H   pantoprazole (PROTONIX) IV  40 mg Intravenous Q24H   Continuous Infusions:  diltiazem (CARDIZEM) infusion 10 mg/hr (05/08/21 0600)   lactated ringers 70 mL/hr at 05/08/21 0600   methocarbamol (ROBAXIN) IV     promethazine (PHENERGAN) injection (IM or IVPB) 70 mL/hr at 05/04/21 0230   PRN Meds: acetaminophen **OR** acetaminophen, methocarbamol (ROBAXIN) IV, metoprolol tartrate, morphine injection, ondansetron (ZOFRAN) IV, phenol, promethazine (PHENERGAN) injection (IM or IVPB)   Vital Signs    Vitals:   05/07/21 1955 05/07/21 2312 05/08/21 0400 05/08/21 0832  BP: 137/79 133/71 116/71 129/69  Pulse: 80 86 82 73  Resp: '17 20 20 20  '$ Temp: 98.1 F (36.7 C) 98.3 F (36.8 C) 98.6 F (37 C) 98.3 F (36.8 C)  TempSrc: Oral Oral Oral Oral  SpO2: 96% 92% 91% 94%  Weight:      Height:        Intake/Output Summary (Last 24 hours) at 05/08/2021 1023 Last data filed at 05/08/2021 0835 Gross per 24 hour  Intake 2801.23 ml  Output 1050 ml  Net 1751.23 ml   Last 3 Weights 05/02/2021 10/18/2018 10/08/2017  Weight (lbs) 170 lb 174 lb 3.2 oz 179 lb  Weight (kg) 77.111 kg 79.017 kg 81.194 kg      Telemetry    SR this morning with rate of 80s, frequent PACs, episode of A fib RVR  - Personally Reviewed  ECG    N/A this AM  - Personally Reviewed  Physical Exam   GEN: No acute distress.   Neck: No JVD Cardiac: RRR, no murmurs, rubs, or gallops.  Respiratory:  Clear to auscultation bilaterally. GI: Soft, non-tender. NG to [place], dark green/bile colored drainage noted in canister  MS: No BLE edema, SCDs in place; No deformity. Neuro:  Nonfocal  Psych: Normal affect   Labs    High Sensitivity Troponin:   Recent Labs  Lab 05/02/21 1806 05/02/21 2007  TROPONINIHS 17 20*      Chemistry Recent Labs  Lab 05/02/21 1806 05/02/21 1814 05/05/21 0152 05/06/21 0451 05/08/21 0621  NA 132*   < > 135 134* 134*  K 4.1   < > 3.2* 3.4* 3.5  CL 96*   < > 96* 104 101  CO2 24   < > '30 27 22  '$ GLUCOSE 124*   < > 98 88 83  BUN 21   < > 39* 24* 16  CREATININE 1.03   < > 1.17 0.93 0.81  CALCIUM 10.3   < > 8.6* 7.7* 8.2*  PROT 8.0  --   --   --   --   ALBUMIN 4.1  --   --   --   --   AST 31  --   --   --   --   ALT 20  --   --   --   --   ALKPHOS 63  --   --   --   --  BILITOT 1.3*  --   --   --   --   GFRNONAA >60   < > 60* >60 >60  ANIONGAP 12   < > 9 3* 11   < > = values in this interval not displayed.     Hematology Recent Labs  Lab 05/04/21 0540 05/06/21 0451 05/08/21 0621  WBC 6.2 6.9 9.8  RBC 4.45 4.63 4.14*  HGB 13.9 14.5 12.7*  HCT 41.1 43.0 38.9*  MCV 92.4 92.9 94.0  MCH 31.2 31.3 30.7  MCHC 33.8 33.7 32.6  RDW 13.0 12.7 12.9  PLT 159 180 206    BNPNo results for input(s): BNP, PROBNP in the last 168 hours.   DDimer No results for input(s): DDIMER in the last 168 hours.   Radiology    DG Abd Portable 1V  Result Date: 05/07/2021 CLINICAL DATA:  Obstruction EXAM: PORTABLE ABDOMEN - 1 VIEW COMPARISON:  Portable exam 0525 hours compared to 05/06/2021 FINDINGS: Nasogastric tube projects over proximal stomach. Retained contrast in decompressed RIGHT colon. Numerous air-filled distended loops of small bowel throughout abdomen consistent with persistent small-bowel obstruction. Some gas is present in the distal sigmoid colon and rectum. No bowel wall thickening. Degenerative disc disease changes and dextroconvex scoliosis lumbar  spine. Osseous demineralization and RIGHT hip prosthesis noted. IMPRESSION: Persistent small bowel obstruction Electronically Signed   By: Lavonia Dana M.D.   On: 05/07/2021 08:18    Cardiac Studies   Echo 05/04/2021:   1. Left ventricular ejection fraction, by estimation, is 60 to 65%. The  left ventricle has normal function. The left ventricle has no regional  wall motion abnormalities. There is mild left ventricular hypertrophy.  Left ventricular diastolic parameters  are indeterminate.   2. Right ventricular systolic function is normal. The right ventricular  size is normal. Tricuspid regurgitation signal is inadequate for assessing  PA pressure.   3. The mitral valve is degenerative. Trivial mitral valve regurgitation.  No evidence of mitral stenosis.   4. The aortic valve is grossly normal. There is mild thickening of the  aortic valve. Aortic valve regurgitation is trivial. No aortic stenosis is  present.   5. The inferior vena cava is dilated in size with >50% respiratory  variability, suggesting right atrial pressure of 8 mmHg.  Patient Profile     87 y.o. male with PMH of SVT, hernia repair, appendectomy, and prior SBO presented for abdominal pain, N/V and painful hernia. Found to be in A fib. Underwent open R inguinal hernia repair with mesh on 05/03/2021.  Assessment & Plan    Paroxysmal A fib RVR, new diagnosis  - hx of SVT in 2018 per chart review, self reports hx of irregular fast heart beat 3 years ago, unsure if A fib  - likely new onset post operative A fib this admission - Echo from 05/04/21 showed EF 60-65%, no RWMA, trivial MR, trivial AR  - currently in SR, continue to be in and out of A fib on telemetry, asymptomatic with RVR episodes, continued on IV Cardizem gtt at '10mg'$ /hr and PRN IV metoprolol , may transition to PO metoprolol '25mg'$  q6h once NG tube is clamped and tolerating PO intake with resolution of SBO  - CHA2DS2VASc score is 2 due to age, currently not on  therapeutic Lovenox (only '40mg'$  daily for DVT ppx) per primary team, consider transition to therapeutic dose if OK per surgery and eventually transition to PO Eliquis '5mg'$  BID when SBO/ileus resolves, discussed bleeding precaution  - optimize  electrolytes, keep K >4 and Mag >2  Hypokalemia - Mag WNL from 05/06/21 , K 3.5 today, may optimize with 20 meq IV or consider adding KCL to IVF with continuous NG suction   SBO with incarcerated R inguinal hernia - s/p open right inguinal hernia repair with mesh by Dr. Bobbye Morton 05/03/21  - CTAP from 05/05/21 showed persistent SBO with transition point in the left lower quadrant anteriorly, possibly due to adhesions - X-ray 8/10 showed persistent SBO , s/p enema 8/10, plan NG clamp trial today per surgery  - consider avoid morphine if possible  - managed by surgery, currently still on NG to LIWS for concerning of ileus versus SBO  Ascending aortic dilation/ aneurysm - CT on 05/02/21 showed aneurysmal dilatation of the ascending aorta up to 4.8 cm. - follow up outpatient with CT surgery     For questions or updates, please contact [facility] Please consult [email or web address] for contact info under        Signed, Margie Billet, NP  05/08/2021, 10:23 AM    Personally seen and examined. Agree with APP above with the following comments: Briefly 87 yo M with hx of TAA, new PAF post SBO Patient notes that he is excited about the trial of IVF in the hopes of getting transition to PO Exam notable for minimal BS; regular rhythm; NG not presently at suction. Labs notable for slight drop in Hgb 13.9-> 14.5-> 12.7 with no bleeding diathesis Personally reviewed relevant tests; tele shows intermittent short runs of AF and PACs Would recommend - continuing IV/[place] therapy; hoping patient will tolerate PO and we will be able to transition to oral therapy  Rudean Haskell, MD [place]  [address] [place], [place] [address] [phone]  10:41 AM

## 2021-05-08 NOTE — Progress Notes (Signed)
NG clamping trial today. Pt had one episode of N/V, ~ 300-400cc of substance. Administered prn zofran. Pt unable to tolerate clear liquid diet.   Page sent to [place] Crane, MD.

## 2021-05-09 ENCOUNTER — Encounter (HOSPITAL_COMMUNITY)
Admission: EM | Disposition: A | Discharge status: Rehabilitation Facility | Payer: Self-pay | Source: Home / Self Care | Attending: Internal Medicine

## 2021-05-09 ENCOUNTER — Encounter (HOSPITAL_COMMUNITY): Payer: Self-pay | Admitting: Internal Medicine

## 2021-05-09 ENCOUNTER — Inpatient Hospital Stay (HOSPITAL_COMMUNITY): Payer: Medicare Other | Admitting: Anesthesiology

## 2021-05-09 DIAGNOSIS — I4891 Unspecified atrial fibrillation: Secondary | ICD-10-CM | POA: Diagnosis not present

## 2021-05-09 HISTORY — PX: BOWEL RESECTION: SHX1257

## 2021-05-09 HISTORY — PX: LAPAROSCOPY: SHX197

## 2021-05-09 HISTORY — PX: LAPAROTOMY: SHX154

## 2021-05-09 LAB — TYPE AND SCREEN
ABO/RH(D): A POS
Antibody Screen: NEGATIVE

## 2021-05-09 LAB — ABO/RH: ABO/RH(D): A POS

## 2021-05-09 SURGERY — LAPAROSCOPY, DIAGNOSTIC
Anesthesia: General | Site: Abdomen

## 2021-05-09 MED ORDER — ORAL CARE MOUTH RINSE: 15.0000 mL | Freq: Once | OROMUCOSAL | Status: AC

## 2021-05-09 MED ORDER — FENTANYL CITRATE (PF) 100 MCG/2ML IJ SOLN
INTRAMUSCULAR | Status: AC
  Administered 2021-05-09: 25 ug via INTRAVENOUS
  Filled 2021-05-09: qty 2

## 2021-05-09 MED ORDER — PROPOFOL 10 MG/ML IV BOLUS
INTRAVENOUS | Status: AC
  Filled 2021-05-09: qty 20

## 2021-05-09 MED ORDER — CHLORHEXIDINE GLUCONATE 0.12 % MT SOLN: 15.0000 mL | Freq: Once | OROMUCOSAL | Status: AC

## 2021-05-09 MED ORDER — PROPOFOL 10 MG/ML IV BOLUS
INTRAVENOUS | Status: DC | PRN
  Administered 2021-05-09: 100 mg via INTRAVENOUS

## 2021-05-09 MED ORDER — DEXAMETHASONE SODIUM PHOSPHATE 10 MG/ML IJ SOLN
INTRAMUSCULAR | Status: AC
  Filled 2021-05-09: qty 1

## 2021-05-09 MED ORDER — ONDANSETRON HCL 4 MG/2ML IJ SOLN
INTRAMUSCULAR | Status: DC | PRN
  Administered 2021-05-09: 4 mg via INTRAVENOUS

## 2021-05-09 MED ORDER — LIDOCAINE HCL (CARDIAC) PF 100 MG/5ML IV SOSY
PREFILLED_SYRINGE | INTRAVENOUS | Status: DC | PRN
  Administered 2021-05-09: 40 mg via INTRAVENOUS

## 2021-05-09 MED ORDER — METOPROLOL TARTRATE 5 MG/5ML IV SOLN
INTRAVENOUS | Status: DC | PRN
  Administered 2021-05-09: 2 mg via INTRAVENOUS
  Administered 2021-05-09: 3 mg via INTRAVENOUS

## 2021-05-09 MED ORDER — FENTANYL CITRATE (PF) 100 MCG/2ML IJ SOLN
INTRAMUSCULAR | Status: DC | PRN
  Administered 2021-05-09 (×3): 50 ug via INTRAVENOUS

## 2021-05-09 MED ORDER — ONDANSETRON HCL 4 MG/2ML IJ SOLN: 4.0000 mg | Freq: Once | INTRAMUSCULAR | Status: DC | PRN

## 2021-05-09 MED ORDER — ESMOLOL HCL 100 MG/10ML IV SOLN
INTRAVENOUS | Status: DC | PRN
  Administered 2021-05-09: 10 mg via INTRAVENOUS

## 2021-05-09 MED ORDER — 0.9 % SODIUM CHLORIDE (POUR BTL) OPTIME
TOPICAL | Status: DC | PRN
  Administered 2021-05-09 (×2): 1000 mL

## 2021-05-09 MED ORDER — AMISULPRIDE (ANTIEMETIC) 5 MG/2ML IV SOLN: 10.0000 mg | Freq: Once | INTRAVENOUS | Status: DC | PRN

## 2021-05-09 MED ORDER — SUGAMMADEX SODIUM 200 MG/2ML IV SOLN
INTRAVENOUS | Status: DC | PRN
  Administered 2021-05-09: 154.2 mg via INTRAVENOUS

## 2021-05-09 MED ORDER — METOPROLOL TARTRATE 5 MG/5ML IV SOLN
INTRAVENOUS | Status: AC
  Filled 2021-05-09: qty 5

## 2021-05-09 MED ORDER — SUCCINYLCHOLINE CHLORIDE 200 MG/10ML IV SOSY
PREFILLED_SYRINGE | INTRAVENOUS | Status: AC
  Filled 2021-05-09: qty 10

## 2021-05-09 MED ORDER — LIP MEDEX EX OINT
TOPICAL_OINTMENT | CUTANEOUS | Status: DC | PRN
  Filled 2021-05-09: qty 7

## 2021-05-09 MED ORDER — ALBUMIN HUMAN 5 % IV SOLN: INTRAVENOUS | Status: DC | PRN

## 2021-05-09 MED ORDER — SODIUM CHLORIDE 0.9 % IV SOLN
INTRAVENOUS | Status: DC | PRN
  Administered 2021-05-09 (×2): 60 ug/min via INTRAVENOUS

## 2021-05-09 MED ORDER — FENTANYL CITRATE (PF) 100 MCG/2ML IJ SOLN
25.0000 ug | INTRAMUSCULAR | Status: DC | PRN
  Administered 2021-05-09 (×3): 25 ug via INTRAVENOUS

## 2021-05-09 MED ORDER — MIDAZOLAM HCL 2 MG/2ML IJ SOLN
INTRAMUSCULAR | Status: AC
  Filled 2021-05-09: qty 2

## 2021-05-09 MED ORDER — CEFAZOLIN SODIUM-DEXTROSE 2-4 GM/100ML-% IV SOLN
2.0000 g | INTRAVENOUS | Status: AC
  Administered 2021-05-09: 2 g via INTRAVENOUS
  Filled 2021-05-09 (×2): qty 100

## 2021-05-09 MED ORDER — ROCURONIUM BROMIDE 100 MG/10ML IV SOLN
INTRAVENOUS | Status: DC | PRN
  Administered 2021-05-09: 10 mg via INTRAVENOUS
  Administered 2021-05-09: 30 mg via INTRAVENOUS

## 2021-05-09 MED ORDER — ESMOLOL HCL 100 MG/10ML IV SOLN
INTRAVENOUS | Status: AC
  Filled 2021-05-09: qty 10

## 2021-05-09 MED ORDER — BUPIVACAINE-EPINEPHRINE 0.25% -1:200000 IJ SOLN
INTRAMUSCULAR | Status: DC | PRN
  Administered 2021-05-09: 5 mL

## 2021-05-09 MED ORDER — ONDANSETRON HCL 4 MG/2ML IJ SOLN
INTRAMUSCULAR | Status: AC
  Filled 2021-05-09: qty 2

## 2021-05-09 MED ORDER — ACETAMINOPHEN 10 MG/ML IV SOLN
INTRAVENOUS | Status: AC
  Filled 2021-05-09: qty 100

## 2021-05-09 MED ORDER — SUCCINYLCHOLINE CHLORIDE 200 MG/10ML IV SOSY
PREFILLED_SYRINGE | INTRAVENOUS | Status: DC | PRN
  Administered 2021-05-09: 100 mg via INTRAVENOUS

## 2021-05-09 MED ORDER — LACTATED RINGERS IV SOLN: INTRAVENOUS | Status: DC

## 2021-05-09 MED ORDER — FENTANYL CITRATE (PF) 250 MCG/5ML IJ SOLN
INTRAMUSCULAR | Status: AC
  Filled 2021-05-09: qty 5

## 2021-05-09 MED ORDER — ROCURONIUM BROMIDE 10 MG/ML (PF) SYRINGE
PREFILLED_SYRINGE | INTRAVENOUS | Status: AC
  Filled 2021-05-09: qty 10

## 2021-05-09 MED ORDER — CHLORHEXIDINE GLUCONATE 0.12 % MT SOLN
OROMUCOSAL | Status: AC
  Administered 2021-05-09: 15 mL via OROMUCOSAL
  Filled 2021-05-09: qty 15

## 2021-05-09 MED ORDER — PHENYLEPHRINE HCL (PRESSORS) 10 MG/ML IV SOLN
INTRAVENOUS | Status: DC | PRN
  Administered 2021-05-09: 80 ug via INTRAVENOUS
  Administered 2021-05-09 (×3): 120 ug via INTRAVENOUS

## 2021-05-09 SURGICAL SUPPLY — 63 items
ADH SKN CLS APL DERMABOND .7 (GAUZE/BANDAGES/DRESSINGS)
APL PRP STRL LF DISP 70% ISPRP (MISCELLANEOUS) ×2
APPLIER CLIP 5 13 M/L LIGAMAX5 (MISCELLANEOUS)
APR CLP MED LRG 5 ANG JAW (MISCELLANEOUS)
BAG COUNTER SPONGE SURGICOUNT (BAG) ×3 IMPLANT
BAG SPNG CNTER NS LX DISP (BAG) ×2
BLADE CLIPPER SURG (BLADE) IMPLANT
CANISTER SUCT 3000ML PPV (MISCELLANEOUS) ×3 IMPLANT
CELLS DAT CNTRL 66122 CELL SVR (MISCELLANEOUS) ×2 IMPLANT
CHLORAPREP W/TINT 26 (MISCELLANEOUS) ×3 IMPLANT
CLIP APPLIE 5 13 M/L LIGAMAX5 (MISCELLANEOUS) IMPLANT
COVER SURGICAL LIGHT HANDLE (MISCELLANEOUS) ×3 IMPLANT
DERMABOND ADVANCED (GAUZE/BANDAGES/DRESSINGS)
DERMABOND ADVANCED .7 DNX12 (GAUZE/BANDAGES/DRESSINGS) IMPLANT
DRAPE LAPAROSCOPIC ABDOMINAL (DRAPES) ×3 IMPLANT
DRSG OPSITE POSTOP 4X10 (GAUZE/BANDAGES/DRESSINGS) IMPLANT
DRSG OPSITE POSTOP 4X8 (GAUZE/BANDAGES/DRESSINGS) ×3 IMPLANT
ELECT BLADE 6.5 EXT (BLADE) IMPLANT
ELECT CAUTERY BLADE 6.4 (BLADE) IMPLANT
ELECT REM PT RETURN 9FT ADLT (ELECTROSURGICAL) ×3
ELECTRODE REM PT RTRN 9FT ADLT (ELECTROSURGICAL) ×2 IMPLANT
GLOVE SURG ENC MOIS LTX SZ7 (GLOVE) ×3 IMPLANT
GLOVE SURG UNDER POLY LF SZ7.5 (GLOVE) ×3 IMPLANT
GOWN STRL REUS W/ TWL LRG LVL3 (GOWN DISPOSABLE) ×8 IMPLANT
GOWN STRL REUS W/TWL LRG LVL3 (GOWN DISPOSABLE) ×12
HANDLE SUCTION POOLE (INSTRUMENTS) ×2 IMPLANT
KIT BASIN OR (CUSTOM PROCEDURE TRAY) ×6 IMPLANT
KIT TURNOVER KIT B (KITS) ×3 IMPLANT
LIGASURE IMPACT 36 18CM CVD LR (INSTRUMENTS) IMPLANT
NS IRRIG 1000ML POUR BTL (IV SOLUTION) ×3 IMPLANT
PACK GENERAL/GYN (CUSTOM PROCEDURE TRAY) ×3 IMPLANT
PAD ARMBOARD 7.5X6 YLW CONV (MISCELLANEOUS) ×6 IMPLANT
PENCIL SMOKE EVACUATOR (MISCELLANEOUS) IMPLANT
RELOAD PROXIMATE 75MM BLUE (ENDOMECHANICALS) ×6 IMPLANT
RTRCTR WOUND ALEXIS 18CM MED (MISCELLANEOUS) ×3
SCISSORS LAP 5X35 DISP (ENDOMECHANICALS) ×3 IMPLANT
SET IRRIG TUBING LAPAROSCOPIC (IRRIGATION / IRRIGATOR) IMPLANT
SET TUBE SMOKE EVAC HIGH FLOW (TUBING) ×3 IMPLANT
SLEEVE ENDOPATH XCEL 5M (ENDOMECHANICALS) ×3 IMPLANT
SPECIMEN JAR LARGE (MISCELLANEOUS) IMPLANT
SPONGE T-LAP 18X18 ~~LOC~~+RFID (SPONGE) ×3 IMPLANT
STAPLER GUN LINEAR PROX 60 (STAPLE) ×3 IMPLANT
STAPLER PROXIMATE 75MM BLUE (STAPLE) ×3 IMPLANT
STAPLER VISISTAT 35W (STAPLE) ×3 IMPLANT
SUCTION POOLE HANDLE (INSTRUMENTS) ×3
SUT PDS AB 1 TP1 96 (SUTURE) ×6 IMPLANT
SUT SILK 2 0 (SUTURE)
SUT SILK 2 0 SH CR/8 (SUTURE) ×3 IMPLANT
SUT SILK 2 0 TIES 10X30 (SUTURE) ×3 IMPLANT
SUT SILK 2-0 18XBRD TIE 12 (SUTURE) IMPLANT
SUT SILK 3 0 (SUTURE)
SUT SILK 3 0 SH CR/8 (SUTURE) ×3 IMPLANT
SUT SILK 3 0 TIES 10X30 (SUTURE) ×3 IMPLANT
SUT SILK 3-0 18XBRD TIE 12 (SUTURE) IMPLANT
SUT VIC AB 3-0 SH 27 (SUTURE)
SUT VIC AB 3-0 SH 27X BRD (SUTURE) IMPLANT
TOWEL GREEN STERILE (TOWEL DISPOSABLE) ×3 IMPLANT
TOWEL GREEN STERILE FF (TOWEL DISPOSABLE) ×3 IMPLANT
TRAY FOLEY MTR SLVR 16FR STAT (SET/KITS/TRAYS/PACK) ×3 IMPLANT
TROCAR XCEL BLUNT TIP 100MML (ENDOMECHANICALS) ×3 IMPLANT
TROCAR XCEL NON-BLD 11X100MML (ENDOMECHANICALS) IMPLANT
TROCAR XCEL NON-BLD 5MMX100MML (ENDOMECHANICALS) ×3 IMPLANT
YANKAUER SUCT BULB TIP NO VENT (SUCTIONS) IMPLANT

## 2021-05-09 NOTE — Op Note (Signed)
Preoperative diagnosis: Small bowel obstruction status post open inguinal hernia repair 7 days ago Postoperative diagnosis: Small bowel obstruction related to ischemic portion of small bowel with microperforation Procedure: 1.  Diagnostic laparoscopy 2.  Open small bowel resection with primary anastomosis Surgeon: Dr. Serita Grammes Assistant: Richard Miu PA-C Estimated blood loss: 30 cc Drains: None Specimens: Small bowel to pathology Complications: None Sponge needle count was correct at completion Disposition to recovery in stable condition  Indications: This is an 87 year old male who presented 7 days ago with a incarcerated right groin hernia and a small bowel obstruction.  He was taken to the operating room and underwent hernia repair with mesh.  Postoperatively he has intermittently passed some gas and stool but has not really progressed over the last couple of days.  Yesterday I attempted to challenge him orally and he was unable to tolerate this.  He does have a CT scan that shows a concern for a bowel obstruction.  I discussed going to the operating room for laparoscopy today possible small bowel resection.  Procedure: After informed consent was obtained the patient was taken the operating.  He was given antibiotics.  SCDs were in place.  He was placed under general anesthesia without complication.  He had a Foley catheter placed.  He already had a nasogastric tube in place.  He was prepped and draped in the standard sterile surgical fashion.  Surgical timeout was then performed.  Infiltrated Marcaine below his umbilicus.  I then made a vertical incision.  I grasped the fascia and entered this sharply.  I entered the peritoneum bluntly.  This was done without injury.  I placed a 0 Vicryl pursestring suture through the fascia and inserted a Hassan trocar.  I then insufflated the abdomen to 15 mmHg pressure.  I then placed 2 5 mm trochars in the right upper quadrant and right abdomen.  I  then proceeded to run the small bowel.  I was able to identify the ligament of Treitz and he had very dilated small bowel I tracked this into the left mid abdomen where he had a portion of his small bowel that was adherent to the colonic mesentery.  Once I pulled this off it became apparent that this portion was partially ischemic and there was a microperforation with some spillage of bile.  Distal to this it was decompressed.  I then elected to make a small incision around his umbilicus.  I placed an IT sales professional.  I exteriorized the small bowel.  I examined it to ensure I did not make an injury while I ran it before which I did not.  I then was able to note this portion of the bowel that had a pinhole leak of bilious fluid.  I oversewed this with a 2-0 silk to control the contamination.  I then divided the bowel distal to this with a GIA stapler.  I divided the bowel proximal with a GIA stapler.  I then used silk ties to tie off the mesentery.  This portion of bowel was then passed off the table as a specimen.  I brought the 2 cut ends of the small bowel together with 3-0 silk suture.  I oversewed the mesentery with 2-0 silk suture.  I then made an enterotomy in both the proximal and distal portion and inserted a GIA stapler.  I created the anastomosis.  I reviewed this and it was hemostatic.  I then closed the common enterotomy with a TX stapler.  I  placed two 3-0 silk sutures at the apex.  This was patent and viable.  I then placed this back in the abdomen.  I remove the wound protector.  I closed this with #1 looped PDS.  Staples were used to close the incisions.  Dressings were placed he tolerated this well and will be transferred to recovery room.

## 2021-05-09 NOTE — Anesthesia Procedure Notes (Signed)
Procedure Name: Intubation Date/Time: 05/09/2021 10:46 AM Performed by: Rayvon Char, CRNA Pre-anesthesia Checklist: Patient identified, Emergency Drugs available and Suction available Patient Re-evaluated:Patient Re-evaluated prior to induction Oxygen Delivery Method: Circle system utilized Preoxygenation: Pre-oxygenation with 100% oxygen Induction Type: IV induction, Rapid sequence and Cricoid Pressure applied Laryngoscope Size: Miller and 2 Grade View: Grade II Tube type: Oral Tube size: 7.0 mm Number of attempts: 1 Airway Equipment and Method: Stylet Placement Confirmation: ETT inserted through vocal cords under direct vision and breath sounds checked- equal and bilateral Secured at: 22 cm Tube secured with: Tape Dental Injury: Teeth and Oropharynx as per pre-operative assessment

## 2021-05-09 NOTE — Progress Notes (Addendum)
Progress Note  Patient Name: Gary Rice [facility] Date of Encounter: 05/09/2021  [facility] Cardiologist: Candee Furbish, MD   Subjective   Patient was not tolerating NG tube clamp trial yesterday, had subsequent nausea and emesis and abdominal bloating. Surgery team at bedside and planning on OR today. He remains asymptomatic from A fib RVR.  Inpatient Medications    Scheduled Meds:  acetaminophen (TYLENOL) oral liquid 160 mg/5 mL  1,000 mg Per Tube Q6H   enoxaparin (LOVENOX) injection  40 mg Subcutaneous Q24H   pantoprazole (PROTONIX) IV  40 mg Intravenous Q24H   Continuous Infusions:  diltiazem (CARDIZEM) infusion 10 mg/hr (05/08/21 1632)   lactated ringers 70 mL/hr at 05/08/21 0600   methocarbamol (ROBAXIN) IV     promethazine (PHENERGAN) injection (IM or IVPB) 70 mL/hr at 05/04/21 0230   PRN Meds: methocarbamol (ROBAXIN) IV, metoprolol tartrate, morphine injection, ondansetron (ZOFRAN) IV, phenol, promethazine (PHENERGAN) injection (IM or IVPB)   Vital Signs    Vitals:   05/08/21 1620 05/08/21 1938 05/09/21 0012 05/09/21 0339  BP: 119/66 136/83 135/78 113/67  Pulse: 76 83 87 87  Resp: '18 16 17 16  '$ Temp: 97.7 F (36.5 C) 97.9 F (36.6 C) 98.1 F (36.7 C) 98.6 F (37 C)  TempSrc: Oral Oral Oral Axillary  SpO2: 93% 96% 94% 96%  Weight:      Height:        Intake/Output Summary (Last 24 hours) at 05/09/2021 0811 Last data filed at 05/08/2021 1700 Gross per 24 hour  Intake 932.47 ml  Output 500 ml  Net 432.47 ml    Last 3 Weights 05/02/2021 10/18/2018 10/08/2017  Weight (lbs) 170 lb 174 lb 3.2 oz 179 lb  Weight (kg) 77.111 kg 79.017 kg 81.194 kg      Telemetry    SR this morning with rate of 70s, frequent PACs,continue to have episode of A fib RVR intermittently over the past 24 hours  - Personally Reviewed  ECG    N/A this AM  - Personally Reviewed  Physical Exam   GEN: No acute distress.   Neck: No JVD Cardiac: RRR, no murmurs, rubs, or gallops.   Respiratory: Clear to auscultation bilaterally. GI: Soft, non-tender. Mildly distended abdomen. NG to [place], dark green/bile colored drainage noted in canister  MS: No BLE edema, SCDs in place; No deformity. Neuro:  Nonfocal  Psych: Normal affect   Labs    High Sensitivity Troponin:   Recent Labs  Lab 05/02/21 1806 05/02/21 2007  TROPONINIHS 17 20*       Chemistry Recent Labs  Lab 05/02/21 1806 05/02/21 1814 05/05/21 0152 05/06/21 0451 05/08/21 0621  NA 132*   < > 135 134* 134*  K 4.1   < > 3.2* 3.4* 3.5  CL 96*   < > 96* 104 101  CO2 24   < > '30 27 22  '$ GLUCOSE 124*   < > 98 88 83  BUN 21   < > 39* 24* 16  CREATININE 1.03   < > 1.17 0.93 0.81  CALCIUM 10.3   < > 8.6* 7.7* 8.2*  PROT 8.0  --   --   --   --   ALBUMIN 4.1  --   --   --   --   AST 31  --   --   --   --   ALT 20  --   --   --   --   ALKPHOS 63  --   --   --   --  BILITOT 1.3*  --   --   --   --   GFRNONAA >60   < > 60* >60 >60  ANIONGAP 12   < > 9 3* 11   < > = values in this interval not displayed.      Hematology Recent Labs  Lab 05/04/21 0540 05/06/21 0451 05/08/21 0621  WBC 6.2 6.9 9.8  RBC 4.45 4.63 4.14*  HGB 13.9 14.5 12.7*  HCT 41.1 43.0 38.9*  MCV 92.4 92.9 94.0  MCH 31.2 31.3 30.7  MCHC 33.8 33.7 32.6  RDW 13.0 12.7 12.9  PLT 159 180 206     BNPNo results for input(s): BNP, PROBNP in the last 168 hours.   DDimer No results for input(s): DDIMER in the last 168 hours.   Radiology    No results found.  Cardiac Studies   Echo 05/04/2021:   1. Left ventricular ejection fraction, by estimation, is 60 to 65%. The  left ventricle has normal function. The left ventricle has no regional  wall motion abnormalities. There is mild left ventricular hypertrophy.  Left ventricular diastolic parameters are indeterminate.   2. Right ventricular systolic function is normal. The right ventricular  size is normal. Tricuspid regurgitation signal is inadequate for assessing  PA pressure.    3. The mitral valve is degenerative. Trivial mitral valve regurgitation.  No evidence of mitral stenosis.   4. The aortic valve is grossly normal. There is mild thickening of the  aortic valve. Aortic valve regurgitation is trivial. No aortic stenosis is  present.   5. The inferior vena cava is dilated in size with >50% respiratory  variability, suggesting right atrial pressure of 8 mmHg.  Patient Profile     87 y.o. male with PMH of SVT, hernia repair, appendectomy, and prior SBO presented for abdominal pain, N/V and painful hernia. Found to be in A fib. Underwent open R inguinal hernia repair with mesh on 05/03/2021.  Assessment & Plan    Paroxysmal A fib RVR, new diagnosis  - hx of SVT in 2018 per chart review, self reports hx of irregular fast heart beat 3 years ago, unsure if A fib  - likely new onset post operative A fib this admission - Echo from 05/04/21 showed EF 60-65%, no RWMA, trivial MR, trivial AR  - currently in SR, continue to be in and out of A fib on telemetry, asymptomatic with RVR episodes, continued on IV Cardizem gtt at '10mg'$ /hr and PRN IV metoprolol , may transition to PO metoprolol '25mg'$  q6h once NG tube is clamped and tolerating PO intake with resolution of SBO  - CHA2DS2VASc score is 2 due to age, currently not on therapeutic Lovenox (only '40mg'$  daily for DVT ppx) per primary team, consider transition to therapeutic dose if OK per surgery and eventually transition to PO Eliquis '5mg'$  BID when SBO/ileus resolves, discussed bleeding precaution  - optimize electrolytes, keep K >4 and Mag >2  Hypokalemia - repeat BMP pending today - consider adding KCL to IVF with continuous NG suction and NPO   SBO with incarcerated R inguinal hernia - s/p open right inguinal hernia repair with mesh by Dr. Bobbye Morton 05/03/21  - CTAP from 05/05/21 showed persistent SBO with transition point in the left lower quadrant anteriorly, possibly due to adhesions - X-ray 8/10 showed persistent SBO , s/p  enema 8/10, failed NG clamp trial 8/11 and OR is planned today per surgery  - consider avoid morphine if possible  - managed by surgery,  currently still on NG to LIWS for concerning of ileus versus SBO  Ascending aortic dilation/ aneurysm - CT on 05/02/21 showed aneurysmal dilatation of the ascending aorta up to 4.8 cm. - follow up outpatient with CT surgery     For questions or updates, please contact [place] Please consult [email or web address] for contact info under        Signed, Margie Billet, NP  05/09/2021, 8:11 AM    Reviewed with APP above with the following comments: - still not tolerating PO; presently in OR.  Will likely not be able to tolerate PO next few days.  We will peripherally follow over the weekend unless new changes occur  Rudean Haskell, MD Cardiologist [facility]  [address] [place], [place] [address] [phone]  11:34 AM

## 2021-05-09 NOTE — Anesthesia Postprocedure Evaluation (Signed)
Anesthesia Post Note  Patient: Jeiden Hoesing [facility]  Procedure(s) Performed: LAPAROSCOPY DIAGNOSTIC (Abdomen) EXPLORATORY LAPAROTOMY (Abdomen) SMALL BOWEL RESECTION (Abdomen)     Patient location during evaluation: PACU Anesthesia Type: General Level of consciousness: awake and alert, oriented and patient cooperative Pain management: pain level controlled Vital Signs Assessment: post-procedure vital signs reviewed and stable Respiratory status: spontaneous breathing, nonlabored ventilation and respiratory function stable Cardiovascular status: blood pressure returned to baseline and stable Postop Assessment: no apparent nausea or vomiting Anesthetic complications: no   No notable events documented.  Last Vitals:  Vitals:   05/09/21 1310 05/09/21 1325  BP:    Pulse:  66  Resp:  18  Temp: 36.5 C   SpO2:  95%    Last Pain:  Vitals:   05/09/21 1451  TempSrc:   PainSc: 0-No pain                 Pervis Hocking

## 2021-05-09 NOTE — Plan of Care (Signed)
  Problem: Education: Goal: Knowledge of General Education information will improve Description: Including pain rating scale, medication(s)/side effects and non-pharmacologic comfort measures Outcome: Completed/Met

## 2021-05-09 NOTE — Transfer of Care (Signed)
Immediate Anesthesia Transfer of Care Note  Patient: Gary Rice [facility]  Procedure(s) Performed: LAPAROSCOPY DIAGNOSTIC (Abdomen) EXPLORATORY LAPAROTOMY (Abdomen) SMALL BOWEL RESECTION (Abdomen)  Patient Location: PACU  Anesthesia Type:General  Level of Consciousness: awake, alert  and oriented  Airway & Oxygen Therapy: Patient Spontanous Breathing and Patient connected to nasal cannula oxygen  Post-op Assessment: Report given to RN and Post -op Vital signs reviewed and stable  Post vital signs: Reviewed and stable  Last Vitals:  Vitals Value Taken Time  BP 118/75 05/09/21 1211  Temp    Pulse 65 05/09/21 1216  Resp 18 05/09/21 1216  SpO2 92 % 05/09/21 1216  Vitals shown include unvalidated device data.  Last Pain:  Vitals:   05/09/21 0913  TempSrc: Oral  PainSc: 0-No pain      Patients Stated Pain Goal: 0 ([id number])  Complications: No notable events documented.

## 2021-05-09 NOTE — Anesthesia Preprocedure Evaluation (Addendum)
Anesthesia Evaluation  Patient identified by MRN, date of birth, ID band Patient awake    Reviewed: Allergy & Precautions, NPO status , Patient's Chart, lab work & pertinent test results  Airway Mallampati: III  TM Distance: >3 FB Neck ROM: Full    Dental  (+) Dental Advisory Given, Teeth Intact   Pulmonary former smoker (quit 1952),    Pulmonary exam normal breath sounds clear to auscultation       Cardiovascular Normal cardiovascular exam+ dysrhythmias (Afib RVR on admission, continues to have episodes to 145-150bpm in preop on dilt gtt- cardiology is following and aware of these episodes) Atrial Fibrillation  Rhythm:Regular Rate:Normal  Echo 05/04/21: 1. Left ventricular ejection fraction, by estimation, is 60 to 65%. The  left ventricle has normal function. The left ventricle has no regional  wall motion abnormalities. There is mild left ventricular hypertrophy.  Left ventricular diastolic parameters  are indeterminate.  2. Right ventricular systolic function is normal. The right ventricular  size is normal. Tricuspid regurgitation signal is inadequate for assessing  PA pressure.  3. The mitral valve is degenerative. Trivial mitral valve regurgitation.  No evidence of mitral stenosis.  4. The aortic valve is grossly normal. There is mild thickening of the  aortic valve. Aortic valve regurgitation is trivial. No aortic stenosis is  present.  5. The inferior vena cava is dilated in size with >50% respiratory  variability, suggesting right atrial pressure of 8 mmHg.   CT on 05/02/21 showed aneurysmal dilatation of the ascending aorta up to 4.8 cm.   Neuro/Psych negative neurological ROS  negative psych ROS   GI/Hepatic Neg liver ROS, SBO, recent R inguinal hernia repair 05/02/21   Endo/Other  negative endocrine ROS  Renal/GU negative Renal ROS  negative genitourinary   Musculoskeletal negative musculoskeletal ROS (+)    Abdominal   Peds negative pediatric ROS (+)  Hematology negative hematology ROS (+) hct 38.9, plt 206   Anesthesia Other Findings   Reproductive/Obstetrics negative OB ROS                            Anesthesia Physical Anesthesia Plan  ASA: 4  Anesthesia Plan: General   Post-op Pain Management:    Induction: Intravenous, Rapid sequence and Cricoid pressure planned  PONV Risk Score and Plan: 2 and Ondansetron, Dexamethasone and Treatment may vary due to age or medical condition  Airway Management Planned: Oral ETT  Additional Equipment: None  Intra-op Plan:   Post-operative Plan: Extubation in OR  Informed Consent: I have reviewed the patients History and Physical, chart, labs and discussed the procedure including the risks, benefits and alternatives for the proposed anesthesia with the patient or authorized representative who has indicated his/her understanding and acceptance.     Dental advisory given  Plan Discussed with: CRNA  Anesthesia Plan Comments: (Airway note from 05/02/21: Laryngoscope Size: Sabra Heck and 2 Grade View: Grade I Tube type: Oral Tube size: 7.5 mm Number of attempts: 1  NGT in place, will suction prior to inducing GA  D/w pt possible arterial line, possible blood transfusion and possible postoperative ventilation depending on how the surgery goes.)       Anesthesia Quick Evaluation

## 2021-05-09 NOTE — Progress Notes (Signed)
PROGRESS NOTE    Gary Rice [facility]  [id number] DOB: 23-Mar-1931 DOA: 05/02/2021 PCP: Josetta Huddle, MD   Brief Narrative:  Lizzie Dagan is a 87 y.o. male with medical history significant of SVT, hyperlipidemia, appendectomy, bilateral inguinal hernias, previous hernia repair presented to the ED complaining of vomiting.  He reported that his right inguinal hernia had popped and he was sent by his doctor for evaluation of possible bowel obstruction.  In the ED, patient found to be in new onset A. fib with rate in the 160s. CT showing findings consistent with mechanical SBO with transition point related to right inguinal hernia; no perforation.  Moderate fat and small bowel containing left inguinal hernia without adverse features.  ED physician was not able to reduce the inguinal hernia.  General surgery recommended NG tube - given concern for incarcerated RIGHT inguinal hernia patient was taken to OR for repair with mesh overnight 05/03/21.  Assessment & Plan:   Principal Problem:   Atrial fibrillation with rapid ventricular response (HCC) Active Problems:   SBO (small bowel obstruction) (HCC)   Leukocytosis   Hyperlipidemia   Thoracic aortic aneurysm (HCC)  Acute intractable nausea vomiting, resolving - Repeat imaging shows contrast in the colon, hopefully indicative of resolving small bowel obstruction as below - NG tube clamping trial went poorly, diagnostic laparoscopy pending later today  New onset A. fib with RVR, likely provoked given below, moderately well controlled - Cardiology consulted now improving IV Cardizem drip, IV metoprolol 5 mg every 4-6 hours PRN - CHA2DS2-VASc 2 based on age (no clear history of hypertension and not currently on meds).  Patient is currently going to the OR on an urgent basis.  Cardiology recommending starting Eliquis 5 mg twice daily for anticoagulation when safe from surgical standpoint or prior to discharge. - IV fluids and monitor blood  pressure - TSH WNL - Echo - EF 60-65%  SBO with incarcerated R inguinal hernia - Gen Sx following - urgent repair overnight 05/03/21 with mesh - Repeat CT shows ongoing SBO in LLQ with transition point - defer to surgery as this is not anatomically near previous procedure -repeat imaging shows contrast in the colon as above - Pain control, diet advancement, and ambulation status per surgery -Flatus and small bowel movement overnight, NG clamping trial today as above   Leukocytosis, likely reactive given above, resolved - WBC downtrending appropriately with supportive care/surgery - WNL now   Hyperlipidemia -Resume statin when able to take p.o.   Ascending thoracic aortic aneurysm - CT showing aneurysmal dilatation of the ascending aorta up to 4.8 cm. -Radiologist recommending semiannual imaging follow-up by CTA or MRA -Will need cardiothoracic surgery follow-up outpatient per protocol   DVT prophylaxis: SCDs Code Status: FULL Family Communication: Over the phone  Status is: Inpt  Dispo: The patient is from: Home              Anticipated d/c is to: Home with home health PT              Anticipated d/c date is: 48-72h              Patient currently NOT medically stable for discharge  Consultants:  General Surgery, Cardiology  Procedures:  Hernia/mesh repair 05/03/21  Antimicrobials:  None   Subjective: No acute issues or events overnight, patient tolerating p.o. quite poorly, surgery planned for diagnostic laparoscopy later today.  Objective: Vitals:   05/08/21 1620 05/08/21 1938 05/09/21 0012 05/09/21 0339  BP: 119/66 136/83  135/78 113/67  Pulse: 76 83 87 87  Resp: '18 16 17 16  '$ Temp: 97.7 F (36.5 C) 97.9 F (36.6 C) 98.1 F (36.7 C) 98.6 F (37 C)  TempSrc: Oral Oral Oral Axillary  SpO2: 93% 96% 94% 96%  Weight:      Height:        Intake/Output Summary (Last 24 hours) at 05/09/2021 0759 Last data filed at 05/08/2021 1700 Gross per 24 hour  Intake 932.47 ml   Output 500 ml  Net 432.47 ml    Filed Weights   05/02/21 1805  Weight: 77.1 kg    Examination:  General exam: Appears calm and comfortable  Respiratory system: Clear to auscultation. Respiratory effort normal. Cardiovascular system: S1 & S2 heard, RRR. No JVD, murmurs, rubs, gallops or clicks. No pedal edema. Gastrointestinal system: Abdomen is moderately distended, soft and mildly tender. No organomegaly or masses felt. NG tube to LIS -draining minimal dark green fluid. Central nervous system: Alert and oriented. No focal neurological deficits. Extremities: Symmetric 5 x 5 power. Skin: No rashes, lesions or ulcers Psychiatry: Judgement and insight appear normal. Mood & affect appropriate.   Data Reviewed: I have personally reviewed following labs and imaging studies  CBC: Recent Labs  Lab 05/02/21 1806 05/02/21 1814 05/03/21 0150 05/04/21 0540 05/06/21 0451 05/08/21 0621  WBC 18.0*  --  12.3* 6.2 6.9 9.8  NEUTROABS 13.7*  --   --   --   --   --   HGB 16.8 17.0 13.0 13.9 14.5 12.7*  HCT 49.2 50.0 38.6* 41.1 43.0 38.9*  MCV 92.7  --  94.1 92.4 92.9 94.0  PLT 226  --  179 159 180 [id number]    Basic Metabolic Panel: Recent Labs  Lab 05/02/21 1806 05/02/21 1814 05/03/21 0150 05/04/21 0540 05/05/21 0152 05/06/21 0451 05/08/21 0621  NA 132*   < > 135 136 135 134* 134*  K 4.1   < > 3.7 3.2* 3.2* 3.4* 3.5  CL 96*   < > 99 96* 96* 104 101  CO2 24  --  '27 29 30 27 22  '$ GLUCOSE 124*   < > 126* 108* 98 88 83  BUN 21   < > 18 27* 39* 24* 16  CREATININE 1.03   < > 1.09 1.06 1.17 0.93 0.81  CALCIUM 10.3  --  8.8* 8.8* 8.6* 7.7* 8.2*  MG 2.1  --   --   --   --  1.9  --    < > = values in this interval not displayed.    GFR: Estimated Creatinine Clearance: 67.1 mL/min (by C-G formula based on SCr of 0.81 mg/dL). Liver Function Tests: Recent Labs  Lab 05/02/21 1806  AST 31  ALT 20  ALKPHOS 63  BILITOT 1.3*  PROT 8.0  ALBUMIN 4.1    Recent Labs  Lab 05/02/21 1806   LIPASE 27    No results for input(s): AMMONIA in the last 168 hours. Coagulation Profile: No results for input(s): INR, PROTIME in the last 168 hours. Cardiac Enzymes: No results for input(s): CKTOTAL, CKMB, CKMBINDEX, TROPONINI in the last 168 hours. BNP (last 3 results) No results for input(s): PROBNP in the last 8760 hours. HbA1C: No results for input(s): HGBA1C in the last 72 hours. CBG: Recent Labs  Lab 05/05/21 2133  GLUCAP 96    Lipid Profile: No results for input(s): CHOL, HDL, LDLCALC, TRIG, CHOLHDL, LDLDIRECT in the last 72 hours. Thyroid Function Tests: No results for input(s):  TSH, T4TOTAL, FREET4, T3FREE, THYROIDAB in the last 72 hours.  Anemia Panel: No results for input(s): VITAMINB12, FOLATE, FERRITIN, TIBC, IRON, RETICCTPCT in the last 72 hours. Sepsis Labs: No results for input(s): PROCALCITON, LATICACIDVEN in the last 168 hours.  Recent Results (from the past 240 hour(s))  Resp Panel by RT-PCR (Flu A&B, Covid) Nasopharyngeal Swab     Status: None   Collection Time: 05/02/21  9:40 PM   Specimen: Nasopharyngeal Swab; Nasopharyngeal(NP) swabs in vial transport medium  Result Value Ref Range Status   SARS Coronavirus 2 by RT PCR NEGATIVE NEGATIVE Final    Comment: (NOTE) SARS-CoV-2 target nucleic acids are NOT DETECTED.  The SARS-CoV-2 RNA is generally detectable in upper respiratory specimens during the acute phase of infection. The lowest concentration of SARS-CoV-2 viral copies this assay can detect is 138 copies/mL. A negative result does not preclude SARS-Cov-2 infection and should not be used as the sole basis for treatment or other patient management decisions. A negative result may occur with  improper specimen collection/handling, submission of specimen other than nasopharyngeal swab, presence of viral mutation(s) within the areas targeted by this assay, and inadequate number of viral copies(<138 copies/mL). A negative result must be combined  with clinical observations, patient history, and epidemiological information. The expected result is Negative.  Fact Sheet for Patients:  EntrepreneurPulse.com.au  Fact Sheet for Healthcare Providers:  IncredibleEmployment.be  This test is no t yet approved or cleared by the [place] FDA and  has been authorized for detection and/or diagnosis of SARS-CoV-2 by FDA under an Emergency Use Authorization (EUA). This EUA will remain  in effect (meaning this test can be used) for the duration of the COVID-19 declaration under Section 564(b)(1) of the Act, 21 U.S.C.section 360bbb-3(b)(1), unless the authorization is terminated  or revoked sooner.       Influenza A by PCR NEGATIVE NEGATIVE Final   Influenza B by PCR NEGATIVE NEGATIVE Final    Comment: (NOTE) The Xpert Xpress SARS-CoV-2/FLU/RSV plus assay is intended as an aid in the diagnosis of influenza from Nasopharyngeal swab specimens and should not be used as a sole basis for treatment. Nasal washings and aspirates are unacceptable for Xpert Xpress SARS-CoV-2/FLU/RSV testing.  Fact Sheet for Patients: EntrepreneurPulse.com.au  Fact Sheet for Healthcare Providers: IncredibleEmployment.be  This test is not yet approved or cleared by the [place] FDA and has been authorized for detection and/or diagnosis of SARS-CoV-2 by FDA under an Emergency Use Authorization (EUA). This EUA will remain in effect (meaning this test can be used) for the duration of the COVID-19 declaration under Section 564(b)(1) of the Act, 21 U.S.C. section 360bbb-3(b)(1), unless the authorization is terminated or revoked.  Performed at [facility], [place] [address] [place], [place] [address]           Radiology Studies: No results found.  Scheduled Meds:  acetaminophen (TYLENOL) oral liquid 160 mg/5 mL  1,000 mg Per Tube Q6H   enoxaparin (LOVENOX) injection   40 mg Subcutaneous Q24H   pantoprazole (PROTONIX) IV  40 mg Intravenous Q24H   Continuous Infusions:  diltiazem (CARDIZEM) infusion 10 mg/hr (05/08/21 1632)   lactated ringers 70 mL/hr at 05/08/21 0600   methocarbamol (ROBAXIN) IV     promethazine (PHENERGAN) injection (IM or IVPB) 70 mL/hr at 05/04/21 0230    LOS: 7 days   Time spent: 61mn  Serine Kea C Nehemiah Mcfarren, DO [facility]  If 7PM-7AM, please contact night-coverage [email or web address]  05/09/2021, 7:59 AM

## 2021-05-09 NOTE — Progress Notes (Signed)
    7 Days Post-Op  Subjective: Episode of emesis last pm following clamping trial. NG remained clamped overnight. He has slight nausea this am. Passing flatus. Last BM 2 days ago  Objective: Vital signs in last 24 hours: Temp:  [97.7 F (36.5 C)-98.6 F (37 C)] 98.6 F (37 C) (08/12 0339) Pulse Rate:  [73-87] 87 (08/12 0339) Resp:  [16-20] 16 (08/12 0339) BP: (108-136)/(66-83) 113/67 (08/12 0339) SpO2:  [93 %-96 %] 96 % (08/12 0339) Last BM Date: 05/05/21  Intake/Output from previous day: 08/11 0701 - 08/12 0700 In: 932.5 [I.V.:782.5; IV Piggyback:150] Out: 500 [Urine:500] Intake/Output this shift: No intake/output data recorded.  PE: Lungs: CTAB, respiratory effort nonlabored on room air CV: irregular rhythm, regular rate at time of my exam Abd: mild to moderate distension. Soft. nontender. Right inguinal incision clean and dry.  NGT with 200cc of bilious output on clamp. Reconnected to suction with prompt output of 600cc bilious  Lab Results:  Recent Labs    05/08/21 0621  WBC 9.8  HGB 12.7*  HCT 38.9*  PLT 206    BMET Recent Labs    05/08/21 0621  NA 134*  K 3.5  CL 101  CO2 22  GLUCOSE 83  BUN 16  CREATININE 0.81  CALCIUM 8.2*    PT/INR No results for input(s): LABPROT, INR in the last 72 hours. CMP     Component Value Date/Time   NA 134 (L) 05/08/2021 0621   K 3.5 05/08/2021 0621   CL 101 05/08/2021 0621   CO2 22 05/08/2021 0621   GLUCOSE 83 05/08/2021 0621   BUN 16 05/08/2021 0621   CREATININE 0.81 05/08/2021 0621   CALCIUM 8.2 (L) 05/08/2021 0621   PROT 8.0 05/02/2021 1806   ALBUMIN 4.1 05/02/2021 1806   AST 31 05/02/2021 1806   ALT 20 05/02/2021 1806   ALKPHOS 63 05/02/2021 1806   BILITOT 1.3 (H) 05/02/2021 1806   GFRNONAA >60 05/08/2021 0621   GFRAA  08/14/2007 0312    >60        The eGFR has been calculated using the MDRD equation. This calculation has not been validated in all clinical   Lipase     Component Value  Date/Time   LIPASE 27 05/02/2021 1806       Studies/Results: No results found.  Anti-infectives: Anti-infectives (From admission, onward)    None        Assessment/Plan POD 7, s/p open right inguinal hernia repair with mesh by Dr. Bobbye Morton for incarcerated RIH with persistent ileus vs SBO. -emesis with clamping trial yesterday. To OR for diagnostic laparoscopy today which I discussed in detail with him including risk/benefits. NG back to LIWS in the interim -mobilize  -pulm toilet/IS -pain control, multi-modal   FEN - NPO/NGT LIWS VTE - Lovenox ID - none  New onset a fib Ascending thoracic aneurysm   LOS: 7 days    Winferd Humphrey , MD [facility] 05/09/2021, 7:43 AM Please see Amion for pager number during day hours 7:00am-4:30pm or 7:00am -11:30am on weekends

## 2021-05-10 ENCOUNTER — Inpatient Hospital Stay: Payer: Self-pay

## 2021-05-10 ENCOUNTER — Encounter (HOSPITAL_COMMUNITY): Payer: Self-pay | Admitting: General Surgery

## 2021-05-10 DIAGNOSIS — I4891 Unspecified atrial fibrillation: Secondary | ICD-10-CM | POA: Diagnosis not present

## 2021-05-10 LAB — CBC
HCT: 41.5 % (ref 39.0–52.0)
Hemoglobin: 14.1 g/dL (ref 13.0–17.0)
MCH: 31.6 pg (ref 26.0–34.0)
MCHC: 34 g/dL (ref 30.0–36.0)
MCV: 93 fL (ref 80.0–100.0)
Platelets: 260 10*3/uL (ref 150–400)
RBC: 4.46 MIL/uL (ref 4.22–5.81)
RDW: 12.7 % (ref 11.5–15.5)
WBC: 15 10*3/uL — ABNORMAL HIGH (ref 4.0–10.5)
nRBC: 0 % (ref 0.0–0.2)

## 2021-05-10 LAB — BASIC METABOLIC PANEL
Anion gap: 9 (ref 5–15)
BUN: 13 mg/dL (ref 8–23)
CO2: 21 mmol/L — ABNORMAL LOW (ref 22–32)
Calcium: 8.3 mg/dL — ABNORMAL LOW (ref 8.9–10.3)
Chloride: 103 mmol/L (ref 98–111)
Creatinine, Ser: 0.76 mg/dL (ref 0.61–1.24)
GFR, Estimated: >60 mL/min (ref >60)
Glucose, Bld: 149 mg/dL — ABNORMAL HIGH (ref 70–99)
Potassium: 3.8 mmol/L (ref 3.5–5.1)
Sodium: 133 mmol/L — ABNORMAL LOW (ref 135–145)

## 2021-05-10 MED ORDER — DEXTROSE 10 % IV SOLN: INTRAVENOUS | Status: DC

## 2021-05-10 NOTE — Progress Notes (Signed)
Patient ID: Gary Rice, male   DOB: 1931/03/21, 87 y.o.   MRN: [id number] [facility] Progress Note:   1 Day Post-Op  Subjective: Mental status is alert.  Complaints about many small things.  NG is a bother. Objective: Vital signs in last 24 hours: Temp:  [97.5 F (36.4 C)-98.6 F (37 C)] 97.5 F (36.4 C) (08/13 0500) Pulse Rate:  [63-107] 89 (08/13 0500) Resp:  [13-22] 18 (08/13 0500) BP: (99-125)/(66-89) 99/80 (08/13 0500) SpO2:  [90 %-95 %] 92 % (08/13 0500)  Intake/Output from previous day: 08/12 0701 - 08/13 0700 In: 629 [NG/GT:15; IV Piggyback:614] Out: 2100 [Urine:950; Emesis/NG output:1100; Blood:50] Intake/Output this shift: No intake/output data recorded.  Physical Exam: Work of breathing is OK.  No flatus.  NG irritating.    Lab Results:  Results for orders placed or performed during the hospital encounter of 05/02/21 (from the past 48 hour(s))  Type and screen [place]     Status: None   Collection Time: 05/09/21 10:10 AM  Result Value Ref Range   ABO/RH(D) A POS    Antibody Screen NEG    Sample Expiration      05/12/2021,2359 Performed at [facility], [place] [address], [place], [place] [address]   CBC     Status: Abnormal   Collection Time: 05/10/21  4:25 AM  Result Value Ref Range   WBC 15.0 (H) 4.0 - 10.5 K/uL   RBC 4.46 4.22 - 5.81 MIL/uL   Hemoglobin 14.1 13.0 - 17.0 g/dL   HCT 41.5 39.0 - 52.0 %   MCV 93.0 80.0 - 100.0 fL   MCH 31.6 26.0 - 34.0 pg   MCHC 34.0 30.0 - 36.0 g/dL   RDW 12.7 11.5 - 15.5 %   Platelets 260 150 - 400 K/uL   nRBC 0.0 0.0 - 0.2 %    Comment: Performed at [facility], [place] [address] [place], [place] [id number]  Basic metabolic panel     Status: Abnormal   Collection Time: 05/10/21  4:25 AM  Result Value Ref Range   Sodium 133 (L) 135 - 145 mmol/L   Potassium 3.8 3.5 - 5.1 mmol/L   Chloride 103 98 - 111 mmol/L   CO2 21 (L) 22 - 32 mmol/L   Glucose, Bld 149 (H) 70 - 99  mg/dL    Comment: Glucose reference range applies only to samples taken after fasting for at least 8 hours.   BUN 13 8 - 23 mg/dL   Creatinine, Ser 0.76 0.61 - 1.24 mg/dL   Calcium 8.3 (L) 8.9 - 10.3 mg/dL   GFR, Estimated >60 >60 mL/min    Comment: (NOTE) Calculated using the CKD-EPI Creatinine Equation (2021)    Anion gap 9 5 - 15    Comment: Performed at [place] [address] [place] [address], [place], [place] [address]    Radiology/Results: No results found.  Anti-infectives: Anti-infectives (From admission, onward)    Start     Dose/Rate Route Frequency Ordered Stop   05/09/21 0915  ceFAZolin (ANCEF) IVPB 2g/100 mL premix        2 g 200 mL/hr over 30 Minutes Intravenous On call to O.R. 05/09/21 0825 05/09/21 1050       Assessment/Plan: Problem List: Patient Active Problem List   Diagnosis Date Noted   Atrial fibrillation with rapid ventricular response ([place]) 05/02/2021   SBO (small bowel obstruction) ([place]) 05/02/2021   Leukocytosis 05/02/2021   Hyperlipidemia 05/02/2021  Thoracic aortic aneurysm ([place]) 05/02/2021   Incarcerated hernia    Foley removed.  Will keep NG in place until ileus resolves 1 Day Post-Op    LOS: 8 days   Matt B. Hassell Done, MD, [facility] [phone] to reach the surgeon on call.    05/10/2021 8:43 AM

## 2021-05-10 NOTE — Progress Notes (Signed)
Spoke with RN re PICC order.  States needed for TNA starting Sunday.  Plan on PICC placement Sunday.

## 2021-05-10 NOTE — Progress Notes (Signed)
PROGRESS NOTE    Lethaniel Poche [facility]  [id number] DOB: 1931/07/02 DOA: 05/02/2021 PCP: Josetta Huddle, MD   Brief Narrative:  Gary Rice is a 87 y.o. male with medical history significant of SVT, hyperlipidemia, appendectomy, bilateral inguinal hernias, previous hernia repair presented to the ED complaining of vomiting.  He reported that his right inguinal hernia had popped and he was sent by his doctor for evaluation of possible bowel obstruction.  In the ED, patient found to be in new onset A. fib with rate in the 160s. CT showing findings consistent with mechanical SBO with transition point related to right inguinal hernia; no perforation.  Moderate fat and small bowel containing left inguinal hernia without adverse features.  ED physician was not able to reduce the inguinal hernia.  General surgery recommended NG tube - given concern for incarcerated RIGHT inguinal hernia patient was taken to OR for repair with mesh overnight 05/03/21.  Assessment & Plan:   Principal Problem:   Atrial fibrillation with rapid ventricular response (HCC) Active Problems:   SBO (small bowel obstruction) (HCC)   Leukocytosis   Hyperlipidemia   Thoracic aortic aneurysm (HCC)   SBO with incarcerated R inguinal hernia - Gen Sx following - urgent repair overnight 05/03/21 with mesh - diagnostic lap 05/09/21 due to ongoing LLQ ischemia and microperforation requiring resection  - Pain control, diet advancement, and ambulation status per surgery -Postoperative NG/PO management per Sx - will initiate TPN given prolonged NPO status   Acute intractable nausea vomiting, ongoing - Repeat imaging shows contrast in the colon, hopefully indicative of resolving small bowel obstruction as below - NG tube clamping trial went poorly, diagnostic laparoscopy pending later today  New onset A. fib with RVR, likely provoked given below, moderately well controlled - Cardiology consulted now improving IV Cardizem drip, IV  metoprolol 5 mg every 4-6 hours PRN - CHA2DS2-VASc 2 based on age (no clear history of hypertension and not currently on meds).  Patient is currently going to the OR on an urgent basis.  Cardiology recommending starting Eliquis 5 mg twice daily for anticoagulation when safe from surgical standpoint or prior to discharge. - IV fluids and monitor blood pressure - TSH WNL - Echo - EF 60-65%   Leukocytosis, likely reactive given above, resolved - WBC downtrending appropriately with supportive care/surgery - WNL now   Hyperlipidemia -Resume statin when able to take p.o.   Ascending thoracic aortic aneurysm - CT showing aneurysmal dilatation of the ascending aorta up to 4.8 cm. -Radiologist recommending semiannual imaging follow-up by CTA or MRA -Will need cardiothoracic surgery follow-up outpatient per protocol   DVT prophylaxis: SCDs Code Status: FULL Family Communication: Over the phone  Status is: Inpt  Dispo: The patient is from: Home              Anticipated d/c is to: Home with home health PT              Anticipated d/c date is: >72h              Patient currently NOT medically stable for discharge  Consultants:  General Surgery, Cardiology  Procedures:  Hernia/mesh repair 05/03/21 Exploratory laparoscopy 05/09/2021  Antimicrobials:  None   Subjective: No acute issues or events overnight, tolerated procedure quite well, visibly frustrated at his prolonged course which we discussed was not expected.  Otherwise eager for ambulation, pain currently well controlled, no flatus or bowel movement overnight  Objective: Vitals:   05/09/21 1630 05/09/21 2000 05/09/21  2332 05/10/21 0500  BP:  116/71 125/89 99/80  Pulse:  94 (!) 107 89  Resp: (!) '22 20 16 18  '$ Temp:  98.2 F (36.8 C) 97.7 F (36.5 C) (!) 97.5 F (36.4 C)  TempSrc:  Oral Oral Oral  SpO2:  95% 95% 92%  Weight:      Height:        Intake/Output Summary (Last 24 hours) at 05/10/2021 0746 Last data filed at  05/10/2021 0528 Gross per 24 hour  Intake 629 ml  Output 2100 ml  Net -1471 ml    Filed Weights   05/02/21 1805  Weight: 77.1 kg    Examination:  General:  Pleasantly resting in bed, No acute distress. HEENT:  Normocephalic atraumatic.  Sclerae nonicteric, noninjected.  Extraocular movements intact bilaterally.  NG draining dark green liquid Neck:  Without mass or deformity.  Trachea is midline. Lungs:  Clear to auscultate bilaterally without rhonchi, wheeze, or rales. Heart:  Regular rate and rhythm.  Without murmurs, rubs, or gallops. Abdomen:  Soft, minimally tender postoperative bandage and incision sites clean dry intact. Extremities: Without cyanosis, clubbing, edema, or obvious deformity. Vascular:  Dorsalis pedis and posterior tibial pulses palpable bilaterally. Skin:  Warm and dry, no erythema, no ulcerations.  Data Reviewed: I have personally reviewed following labs and imaging studies  CBC: Recent Labs  Lab 05/04/21 0540 05/06/21 0451 05/08/21 0621 05/10/21 0425  WBC 6.2 6.9 9.8 15.0*  HGB 13.9 14.5 12.7* 14.1  HCT 41.1 43.0 38.9* 41.5  MCV 92.4 92.9 94.0 93.0  PLT 159 180 206 [id number]    Basic Metabolic Panel: Recent Labs  Lab 05/04/21 0540 05/05/21 0152 05/06/21 0451 05/08/21 0621 05/10/21 0425  NA 136 135 134* 134* 133*  K 3.2* 3.2* 3.4* 3.5 3.8  CL 96* 96* 104 101 103  CO2 '29 30 27 22 '$ 21*  GLUCOSE 108* 98 88 83 149*  BUN 27* 39* 24* 16 13  CREATININE 1.06 1.17 0.93 0.81 0.76  CALCIUM 8.8* 8.6* 7.7* 8.2* 8.3*  MG  --   --  1.9  --   --     GFR: Estimated Creatinine Clearance: 68 mL/min (by C-G formula based on SCr of 0.76 mg/dL). Liver Function Tests: No results for input(s): AST, ALT, ALKPHOS, BILITOT, PROT, ALBUMIN in the last 168 hours.  No results for input(s): LIPASE, AMYLASE in the last 168 hours.  No results for input(s): AMMONIA in the last 168 hours. Coagulation Profile: No results for input(s): INR, PROTIME in the last 168  hours. Cardiac Enzymes: No results for input(s): CKTOTAL, CKMB, CKMBINDEX, TROPONINI in the last 168 hours. BNP (last 3 results) No results for input(s): PROBNP in the last 8760 hours. HbA1C: No results for input(s): HGBA1C in the last 72 hours. CBG: Recent Labs  Lab 05/05/21 2133  GLUCAP 96    Lipid Profile: No results for input(s): CHOL, HDL, LDLCALC, TRIG, CHOLHDL, LDLDIRECT in the last 72 hours. Thyroid Function Tests: No results for input(s): TSH, T4TOTAL, FREET4, T3FREE, THYROIDAB in the last 72 hours.  Anemia Panel: No results for input(s): VITAMINB12, FOLATE, FERRITIN, TIBC, IRON, RETICCTPCT in the last 72 hours. Sepsis Labs: No results for input(s): PROCALCITON, LATICACIDVEN in the last 168 hours.  Recent Results (from the past 240 hour(s))  Resp Panel by RT-PCR (Flu A&B, Covid) Nasopharyngeal Swab     Status: None   Collection Time: 05/02/21  9:40 PM   Specimen: Nasopharyngeal Swab; Nasopharyngeal(NP) swabs in vial transport medium  Result Value  Ref Range Status   SARS Coronavirus 2 by RT PCR NEGATIVE NEGATIVE Final    Comment: (NOTE) SARS-CoV-2 target nucleic acids are NOT DETECTED.  The SARS-CoV-2 RNA is generally detectable in upper respiratory specimens during the acute phase of infection. The lowest concentration of SARS-CoV-2 viral copies this assay can detect is 138 copies/mL. A negative result does not preclude SARS-Cov-2 infection and should not be used as the sole basis for treatment or other patient management decisions. A negative result may occur with  improper specimen collection/handling, submission of specimen other than nasopharyngeal swab, presence of viral mutation(s) within the areas targeted by this assay, and inadequate number of viral copies(<138 copies/mL). A negative result must be combined with clinical observations, patient history, and epidemiological information. The expected result is Negative.  Fact Sheet for Patients:   EntrepreneurPulse.com.au  Fact Sheet for Healthcare Providers:  IncredibleEmployment.be  This test is no t yet approved or cleared by the [place] FDA and  has been authorized for detection and/or diagnosis of SARS-CoV-2 by FDA under an Emergency Use Authorization (EUA). This EUA will remain  in effect (meaning this test can be used) for the duration of the COVID-19 declaration under Section 564(b)(1) of the Act, 21 U.S.C.section 360bbb-3(b)(1), unless the authorization is terminated  or revoked sooner.       Influenza A by PCR NEGATIVE NEGATIVE Final   Influenza B by PCR NEGATIVE NEGATIVE Final    Comment: (NOTE) The Xpert Xpress SARS-CoV-2/FLU/RSV plus assay is intended as an aid in the diagnosis of influenza from Nasopharyngeal swab specimens and should not be used as a sole basis for treatment. Nasal washings and aspirates are unacceptable for Xpert Xpress SARS-CoV-2/FLU/RSV testing.  Fact Sheet for Patients: EntrepreneurPulse.com.au  Fact Sheet for Healthcare Providers: IncredibleEmployment.be  This test is not yet approved or cleared by the [place] FDA and has been authorized for detection and/or diagnosis of SARS-CoV-2 by FDA under an Emergency Use Authorization (EUA). This EUA will remain in effect (meaning this test can be used) for the duration of the COVID-19 declaration under Section 564(b)(1) of the Act, 21 U.S.C. section 360bbb-3(b)(1), unless the authorization is terminated or revoked.  Performed at [facility], [place] [address] [place], [place] [address]           Radiology Studies: No results found.  Scheduled Meds:  acetaminophen (TYLENOL) oral liquid 160 mg/5 mL  1,000 mg Per Tube Q6H   enoxaparin (LOVENOX) injection  40 mg Subcutaneous Q24H   pantoprazole (PROTONIX) IV  40 mg Intravenous Q24H   Continuous Infusions:  diltiazem (CARDIZEM) infusion 10  mg/hr (05/10/21 0711)   lactated ringers 70 mL/hr at 05/10/21 0117   methocarbamol (ROBAXIN) IV     promethazine (PHENERGAN) injection (IM or IVPB) 70 mL/hr at 05/04/21 0230    LOS: 8 days   Time spent: 62mn  Tynisha Ogan C Cerria Randhawa, DO [facility]  If 7PM-7AM, please contact night-coverage [email or web address]  05/10/2021, 7:46 AM

## 2021-05-11 ENCOUNTER — Inpatient Hospital Stay (HOSPITAL_COMMUNITY): Payer: Medicare Other

## 2021-05-11 DIAGNOSIS — I4891 Unspecified atrial fibrillation: Secondary | ICD-10-CM | POA: Diagnosis not present

## 2021-05-11 LAB — COMPREHENSIVE METABOLIC PANEL
ALT: 21 U/L (ref 0–44)
AST: 20 U/L (ref 15–41)
Albumin: 2.3 g/dL — ABNORMAL LOW (ref 3.5–5.0)
Alkaline Phosphatase: 41 U/L (ref 38–126)
Anion gap: 7 (ref 5–15)
BUN: 15 mg/dL (ref 8–23)
CO2: 25 mmol/L (ref 22–32)
Calcium: 8.3 mg/dL — ABNORMAL LOW (ref 8.9–10.3)
Chloride: 102 mmol/L (ref 98–111)
Creatinine, Ser: 0.78 mg/dL (ref 0.61–1.24)
GFR, Estimated: >60 mL/min (ref >60)
Glucose, Bld: 166 mg/dL — ABNORMAL HIGH (ref 70–99)
Potassium: 3.3 mmol/L — ABNORMAL LOW (ref 3.5–5.1)
Sodium: 134 mmol/L — ABNORMAL LOW (ref 135–145)
Total Bilirubin: 0.4 mg/dL (ref 0.3–1.2)
Total Protein: 5.7 g/dL — ABNORMAL LOW (ref 6.5–8.1)

## 2021-05-11 LAB — GLUCOSE, CAPILLARY
Glucose-Capillary: 142 mg/dL — ABNORMAL HIGH (ref 70–99)
Glucose-Capillary: 144 mg/dL — ABNORMAL HIGH (ref 70–99)
Glucose-Capillary: 137 mg/dL — ABNORMAL HIGH (ref 70–99)

## 2021-05-11 LAB — HEMOGLOBIN A1C
Hgb A1c MFr Bld: 5.8 % — ABNORMAL HIGH (ref 4.8–5.6)
Mean Plasma Glucose: 119.76 mg/dL

## 2021-05-11 LAB — TRIGLYCERIDES: Triglycerides: 87 mg/dL (ref <150)

## 2021-05-11 LAB — PHOSPHORUS: Phosphorus: 1.7 mg/dL — ABNORMAL LOW (ref 2.5–4.6)

## 2021-05-11 LAB — MAGNESIUM: Magnesium: 1.7 mg/dL (ref 1.7–2.4)

## 2021-05-11 MED ORDER — TRAVASOL 10 % IV SOLN
INTRAVENOUS | Status: AC
  Filled 2021-05-11: qty 528

## 2021-05-11 MED ORDER — MAGNESIUM SULFATE 2 GM/50ML IV SOLN
2.0000 g | Freq: Once | INTRAVENOUS | Status: AC
  Administered 2021-05-11: 2 g via INTRAVENOUS
  Filled 2021-05-11: qty 50

## 2021-05-11 MED ORDER — SODIUM CHLORIDE 0.9% FLUSH
10.0000 mL | Freq: Two times a day (BID) | INTRAVENOUS | Status: DC
  Administered 2021-05-11 – 2021-05-15 (×5): 10 mL
  Administered 2021-05-16: 20 mL
  Administered 2021-05-16 – 2021-05-19 (×4): 10 mL
  Administered 2021-05-19: 20 mL
  Administered 2021-05-20 (×2): 10 mL
  Administered 2021-05-21: 20 mL
  Administered 2021-05-21 – 2021-05-26 (×10): 10 mL

## 2021-05-11 MED ORDER — INSULIN ASPART 100 UNIT/ML IJ SOLN
0.0000 [IU] | INTRAMUSCULAR | Status: DC
  Administered 2021-05-11 – 2021-05-12 (×3): 1 [IU] via SUBCUTANEOUS
  Administered 2021-05-12: 2 [IU] via SUBCUTANEOUS
  Administered 2021-05-12 – 2021-05-13 (×3): 1 [IU] via SUBCUTANEOUS
  Administered 2021-05-13 (×2): 2 [IU] via SUBCUTANEOUS
  Administered 2021-05-13 – 2021-05-14 (×3): 1 [IU] via SUBCUTANEOUS
  Administered 2021-05-14 (×2): 2 [IU] via SUBCUTANEOUS
  Administered 2021-05-14 – 2021-05-15 (×2): 1 [IU] via SUBCUTANEOUS
  Administered 2021-05-15 – 2021-05-16 (×6): 2 [IU] via SUBCUTANEOUS
  Administered 2021-05-16: 1 [IU] via SUBCUTANEOUS
  Administered 2021-05-17: 2 [IU] via SUBCUTANEOUS
  Administered 2021-05-17 – 2021-05-18 (×7): 1 [IU] via SUBCUTANEOUS
  Administered 2021-05-18 (×2): 2 [IU] via SUBCUTANEOUS

## 2021-05-11 MED ORDER — CHLORHEXIDINE GLUCONATE CLOTH 2 % EX PADS
6.0000 | MEDICATED_PAD | Freq: Every day | CUTANEOUS | Status: DC
  Administered 2021-05-12 – 2021-05-26 (×13): 6 via TOPICAL

## 2021-05-11 MED ORDER — POTASSIUM PHOSPHATES 15 MMOLE/5ML IV SOLN
20.0000 mmol | Freq: Once | INTRAVENOUS | Status: AC
  Administered 2021-05-11: 20 mmol via INTRAVENOUS
  Filled 2021-05-11: qty 6.67

## 2021-05-11 MED ORDER — SODIUM CHLORIDE 0.9% FLUSH
10.0000 mL | INTRAVENOUS | Status: DC | PRN
  Administered 2021-05-21: 10 mL

## 2021-05-11 MED ORDER — LACTATED RINGERS IV SOLN: INTRAVENOUS | Status: DC

## 2021-05-11 MED ORDER — POTASSIUM CHLORIDE 10 MEQ/100ML IV SOLN
10.0000 meq | INTRAVENOUS | Status: AC
  Administered 2021-05-11 (×3): 10 meq via INTRAVENOUS
  Filled 2021-05-11 (×3): qty 100

## 2021-05-11 NOTE — Progress Notes (Signed)
Peripherally Inserted Central Catheter Placement  The IV Nurse has discussed with the patient and/or persons authorized to consent for the patient, the purpose of this procedure and the potential benefits and risks involved with this procedure.  The benefits include less needle sticks, lab draws from the catheter, and the patient may be discharged home with the catheter. Risks include, but not limited to, infection, bleeding, blood clot (thrombus formation), and puncture of an artery; nerve damage and irregular heartbeat and possibility to perform a PICC exchange if needed/ordered by physician.  Alternatives to this procedure were also discussed.  Bard Power PICC patient education guide, fact sheet on infection prevention and patient information card has been provided to patient /or left at bedside.    PICC Placement Documentation  PICC Triple Lumen 05/11/21 PICC Right Brachial 39 cm 0 cm (Active)  Indication for Insertion or Continuance of Line Vasoactive infusions;Administration of hyperosmolar/irritating solutions (i.e. TPN, Vancomycin, etc.) 05/11/21 1721  Exposed Catheter (cm) 0 cm 05/11/21 1721  Site Assessment Clean;Dry;Intact 05/11/21 1721  Lumen #1 Status Flushed;Saline locked;Blood return noted 05/11/21 1721  Lumen #2 Status Flushed;Saline locked;Blood return noted 05/11/21 1721  Lumen #3 Status Flushed;Saline locked;Blood return noted 05/11/21 1721  Dressing Type Transparent 05/11/21 1721  Dressing Status Clean;Dry;Intact 05/11/21 1721  Antimicrobial disc in place? Yes 05/11/21 1721  Safety Lock Not Applicable [id number]  Line Care Connections checked and tightened 05/11/21 1721  Line Adjustment (NICU/IV Team Only) No 05/11/21 1721  Dressing Intervention New dressing 05/11/21 1721  Dressing Change Due 05/18/21 05/11/21 1721       Gary Rice 05/11/2021, 5:22 PM

## 2021-05-11 NOTE — Progress Notes (Signed)
PHARMACY - TOTAL PARENTERAL NUTRITION CONSULT NOTE   Indication: Prolonged ileus, SBO  Patient Measurements: Height: '5\' 11"'$  (180.3 cm) Weight: 77.1 kg (170 lb) IBW/kg (Calculated) : 75.3 TPN AdjBW (KG): 77.1 Body mass index is 23.71 kg/m. Usual Weight:    Assessment: Sent by OP MD 8/5 for r/o SBO. New onset afib (CHA2DS2-VASc 2) . CT showing findings consistent with mechanical SBO with transition point related to right inguinal hernia; no perforation. Has been unable to tolerate orals. Start TPN 8/14.  PMH: SVT, hyperlipidemia, appendectomy, bilateral inguinal hernias, previous hernia repair  Glucose / Insulin: No h/o DM Electrolytes: K+ 3.3 low. Phos 1.7 low, Mg 1.7 (goal K+>=4, Mg >=2), CoCa 9.66. Renal: Scr <1 Hepatic: LFT's WNL. Albumin 2.3. TG 87 Intake / Output; MIVF: UOP 846m, NG output 1100>8076mdown some, LBM 8/8.  - D10W at 5074m and LR at 47m71m - po 240ml52morded?  GI Meds: IV PPI/24h  GI Imaging: - 8/5: CT showing findings consistent with mechanical SBO with transition point related to right inguinal hernia; no perforation.  Moderate fat and small bowel containing left inguinal hernia without adverse features.  - 8/8: 8/8: Persistent small bowel obstruction, with transition point in the left lower quadrant anteriorly, possibly due to adhesions.  GI Surgeries / Procedures:  8/6:  incarcerated RIGHT inguinal hernia repair with mesh 8/12:  diagnostic lap due to ongoing LLQ ischemia and microperforation requiring resection. Open small bowel resection with primary anastomosis   Central access: PICC 8/14 TPN start date: 8/14  Nutritional Goals (per RD recommendation on  ): kCal:  , Protein:  , Fluid:   Goal TPN rate is   mL/hr (provides   g of protein and   kcals per day)  Current Nutrition:  NPO  Plan:  Start TPN at 40 mL/hr at 1800 Electrolytes in TPN: Na 50mEq33mK 20mEq/71ma 5mEq/L,46m 5mEq/L, 10m Phos 8mmol/L. 58mAc 1:1 Add standard MVI and trace  elements to TPN Initiate Sensitive q4h SSI and adjust as needed  Reduce MIVF to LR at 30mL/hr at28m0 Monitor TPN labs on Mon/Thurs, and PRN KPhos 20mmol IV x87mill also provide 29.5meq K+ Mag 54mfate 2g IV x 1 Kruns 10meq IV x 3 8my. F/u RD assessment for goals.  Jaylaa Gallion S. Kevron Patella, PhaAlford Highland Clinical Staff Pharmacist [email or web address] Cloyde Oregel, CryAlford HighlandliLyman1 AM

## 2021-05-11 NOTE — Progress Notes (Signed)
Occupational Therapy Treatment Patient Details Name: Trejuan Matherne MRN: [id number] DOB: [birth year] Today's Date: 05/11/2021    History of present illness 87 yo male admitted 8/522 after being referred to ED by PCP secondary to incarcerated inguinal hernia causing SBO, also found to be in Afib with RVR in the ED. Received surgical repair of incarcerated inguinal hernia 05/02/21. PMH hernia repair joint replacement.   OT comments  Pt. Seen for skilled OT treatment.  Able to complete bed mobility, LB dressing, and in room ambulation and transfers related to adls.  Moving well some cues for sequencing and safety.    Follow Up Recommendations  Home health OT;Supervision - Intermittent    Equipment Recommendations  Other (comment)    Recommendations for Other Services      Precautions / Restrictions Precautions Precautions: Fall;Other (comment) Precaution Comments: NG tube-md present and turned tubing off       Mobility Bed Mobility Overal bed mobility: Needs Assistance Bed Mobility: Supine to Sit     Supine to sit: Supervision;HOB elevated          Transfers Overall transfer level: Needs assistance Equipment used: Rolling walker (2 wheeled) Transfers: Sit to/from [facility] Sit to Stand: Min guard Stand pivot transfers: Supervision       General transfer comment: Assist for safety and lines. Pt uses momentum to assist with rise to stand.-pt. with inital refusal for gait belt and my proximity to him during ambulation.  had to review our policies and safety during therapy for him to agree to these implementations    Balance                                           ADL either performed or assessed with clinical judgement   ADL Overall ADL's : Needs assistance/impaired                     Lower Body Dressing: Min guard;Sit to/from stand;Sitting/lateral leans   Toilet Transfer: Min guard;Ambulation;RW Toilet Transfer  Details (indicate cue type and reason): simulated with tranfer from eob approx. 3 steps to recliner-initial plan for ambualtion to b.room then states "i dont want to walk anymore"         Functional mobility during ADLs: Min guard;Rolling walker General ADL Comments: bed mobility, functional transfers, lb adls.  overall min guard a.     Vision       Perception     Praxis      Cognition Arousal/Alertness: Awake/alert Behavior During Therapy: WFL for tasks assessed/performed;Agitated Overall Cognitive Status: Within Functional Limits for tasks assessed                                 General Comments: pt. would have moments of not full agitation but a quick shift in mood and then resume banter.  particular of how he wanted things and what he did and did not want and that would trigger the mood change.  ie: adamament refusal of gait belt and i had to review that i had to use it, then he was like "stop dont touch back up" when i was trying to walk with him.  had to explain again i had not worked with him before and i needed to be careful. other incidents with tray placement and how  blanket was to be folded.  did attempt to explain how he was feeling at end of session and i said it is okay youre going to have bad days and grumpy days. he states "im not grumpy i wasnt grumpy".  i explained i was just giving examples of how feelings and moods can change. session ended with him pleasant and all needs reportedly met.        Exercises     Shoulder Instructions       General Comments      Pertinent Vitals/ Pain       Pain Assessment: No/denies pain  Home Living                                          Prior Functioning/Environment              Frequency  Min 2X/week        Progress Toward Goals  OT Goals(current goals can now be found in the care plan section)  Progress towards OT goals: Progressing toward goals     Plan Discharge plan  remains appropriate;Frequency remains appropriate    Co-evaluation                 AM-PAC OT "6 Clicks" Daily Activity     Outcome Measure   Help from another person eating meals?: None Help from another person taking care of personal grooming?: A Little Help from another person toileting, which includes using toliet, bedpan, or urinal?: A Little Help from another person bathing (including washing, rinsing, drying)?: A Little Help from another person to put on and taking off regular upper body clothing?: A Little Help from another person to put on and taking off regular lower body clothing?: A Little 6 Click Score: 19    End of Session Equipment Utilized During Treatment: Rolling walker;Gait belt  OT Visit Diagnosis: Unsteadiness on feet (R26.81);Other abnormalities of gait and mobility (R26.89);Muscle weakness (generalized) (M62.81)   Activity Tolerance Patient tolerated treatment well   Patient Left in chair;with call bell/phone within reach;with chair alarm set   Nurse Communication          Time: 2751-7001 OT Time Calculation (min): 24 min  Charges: OT General Charges $OT Visit: 1 Visit OT Treatments $Self Care/Home Management : 23-37 mins  Sonia Baller, COTA/L Acute Rehabilitation [phone]    Tanya Nones 05/11/2021, 12:16 PM

## 2021-05-11 NOTE — Progress Notes (Signed)
Patient ID: Gary Rice, male   DOB: [birth year], 87 y.o.   MRN: [id number] [facility] Progress Note:   2 Days Post-Op  Subjective: Mental status is alert.  Complaints NG irritation. Objective: Vital signs in last 24 hours: Temp:  [97.5 F (36.4 C)-98.7 F (37.1 C)] 98.7 F (37.1 C) (08/14 0801) Pulse Rate:  [88-115] 105 (08/14 0801) Resp:  [14-20] 14 (08/14 0801) BP: (93-127)/(64-82) 127/82 (08/14 0801) SpO2:  [92 %-100 %] 92 % (08/14 0801)  Intake/Output from previous day: 08/13 0701 - 08/14 0700 In: 1177.4 [P.O.:240; I.V.:937.4] Out: 1650 [Urine:850; Emesis/NG output:800] Intake/Output this shift: No intake/output data recorded.  Physical Exam: Work of breathing is OK.  Working with PT.  NG output decreased.    Lab Results:  Results for orders placed or performed during the hospital encounter of 05/02/21 (from the past 48 hour(s))  CBC     Status: Abnormal   Collection Time: 05/10/21  4:25 AM  Result Value Ref Range   WBC 15.0 (H) 4.0 - 10.5 K/uL   RBC 4.46 4.22 - 5.81 MIL/uL   Hemoglobin 14.1 13.0 - 17.0 g/dL   HCT 41.5 39.0 - 52.0 %   MCV 93.0 80.0 - 100.0 fL   MCH 31.6 26.0 - 34.0 pg   MCHC 34.0 30.0 - 36.0 g/dL   RDW 12.7 11.5 - 15.5 %   Platelets 260 150 - 400 K/uL   nRBC 0.0 0.0 - 0.2 %    Comment: Performed at [facility], [place] [address] [place], [place] [id number]  Basic metabolic panel     Status: Abnormal   Collection Time: 05/10/21  4:25 AM  Result Value Ref Range   Sodium 133 (L) 135 - 145 mmol/L   Potassium 3.8 3.5 - 5.1 mmol/L   Chloride 103 98 - 111 mmol/L   CO2 21 (L) 22 - 32 mmol/L   Glucose, Bld 149 (H) 70 - 99 mg/dL    Comment: Glucose reference range applies only to samples taken after fasting for at least 8 hours.   BUN 13 8 - 23 mg/dL   Creatinine, Ser 0.76 0.61 - 1.24 mg/dL   Calcium 8.3 (L) 8.9 - 10.3 mg/dL   GFR, Estimated >60 >60 mL/min    Comment: (NOTE) Calculated using the CKD-EPI Creatinine Equation  (2021)    Anion gap 9 5 - 15    Comment: Performed at [facility] [address] [place], [place] [address]  Comprehensive metabolic panel     Status: Abnormal   Collection Time: 05/11/21 12:32 AM  Result Value Ref Range   Sodium 134 (L) 135 - 145 mmol/L   Potassium 3.3 (L) 3.5 - 5.1 mmol/L   Chloride 102 98 - 111 mmol/L   CO2 25 22 - 32 mmol/L   Glucose, Bld 166 (H) 70 - 99 mg/dL    Comment: Glucose reference range applies only to samples taken after fasting for at least 8 hours.   BUN 15 8 - 23 mg/dL   Creatinine, Ser 0.78 0.61 - 1.24 mg/dL   Calcium 8.3 (L) 8.9 - 10.3 mg/dL   Total Protein 5.7 (L) 6.5 - 8.1 g/dL   Albumin 2.3 (L) 3.5 - 5.0 g/dL   AST 20 15 - 41 U/L   ALT 21 0 - 44 U/L   Alkaline Phosphatase 41 38 - 126 U/L   Total Bilirubin 0.4 0.3 - 1.2 mg/dL   GFR, Estimated >60 >60 mL/min  Comment: (NOTE) Calculated using the CKD-EPI Creatinine Equation (2021)    Anion gap 7 5 - 15    Comment: Performed at [facility], [place] [address] [place], [place] [address]  Magnesium     Status: None   Collection Time: 05/11/21 12:32 AM  Result Value Ref Range   Magnesium 1.7 1.7 - 2.4 mg/dL    Comment: Performed at [facility], [place] [address] [place], [place] [address]  Phosphorus     Status: Abnormal   Collection Time: 05/11/21 12:32 AM  Result Value Ref Range   Phosphorus 1.7 (L) 2.5 - 4.6 mg/dL    Comment: Performed at [facility] [address] [place], [place] [address]  Triglycerides     Status: None   Collection Time: 05/11/21 12:32 AM  Result Value Ref Range   Triglycerides 87 <150 mg/dL    Comment: Performed at [facility], [place] [address] [place], [place] [address]  Hemoglobin A1c     Status: Abnormal   Collection Time: 05/11/21 12:32 AM  Result Value Ref Range   Hgb A1c MFr Bld 5.8 (H) 4.8 - 5.6 %    Comment: (NOTE) Pre diabetes:          5.7%-6.4%  Diabetes:              >6.4%  Glycemic control for   <7.0% adults  with diabetes    Mean Plasma Glucose 119.76 mg/dL    Comment: Performed at [facility] [address], [place], [place] [address]    Radiology/Results: [place] EKG SITE RITE  Result Date: 05/10/2021 If [facility] image not attached, placement could not be confirmed due to current cardiac rhythm.   Anti-infectives: Anti-infectives (From admission, onward)    Start     Dose/Rate Route Frequency Ordered Stop   05/09/21 0915  ceFAZolin (ANCEF) IVPB 2g/100 mL premix        2 g 200 mL/hr over 30 Minutes Intravenous On call to O.R. 05/09/21 0825 05/09/21 1050       Assessment/Plan: Problem List: Patient Active Problem List   Diagnosis Date Noted   Atrial fibrillation with rapid ventricular response ([place]) 05/02/2021   SBO (small bowel obstruction) ([place]) 05/02/2021   Leukocytosis 05/02/2021   Hyperlipidemia 05/02/2021   Thoracic aortic aneurysm ([place]) 05/02/2021   Incarcerated hernia     Will clamp NG and observe.   2 Days Post-Op    LOS: 9 days   Matt B. Hassell Done, MD, [facility] [phone] to reach the surgeon on call.    05/11/2021 10:33 AM

## 2021-05-11 NOTE — Progress Notes (Signed)
PROGRESS NOTE    Gary Rice [facility]  [id number] DOB: [birth year] DOA: 05/02/2021 PCP: Josetta Huddle, MD   Brief Narrative:  Gary Rice is a 87 y.o. male with medical history significant of SVT, hyperlipidemia, appendectomy, bilateral inguinal hernias, previous hernia repair presented to the ED complaining of vomiting.  He reported that his right inguinal hernia had popped and he was sent by his doctor for evaluation of possible bowel obstruction.  In the ED, patient found to be in new onset A. fib with rate in the 160s. CT showing findings consistent with mechanical SBO with transition point related to right inguinal hernia; no perforation.  Moderate fat and small bowel containing left inguinal hernia without adverse features.  ED physician was not able to reduce the inguinal hernia.  General surgery recommended NG tube - given concern for incarcerated RIGHT inguinal hernia patient was taken to OR for repair with mesh overnight 05/03/21.  Assessment & Plan:   Principal Problem:   Atrial fibrillation with rapid ventricular response (HCC) Active Problems:   SBO (small bowel obstruction) (HCC)   Leukocytosis   Hyperlipidemia   Thoracic aortic aneurysm (HCC)   SBO with incarcerated R inguinal hernia - Gen Sx following - urgent repair overnight 05/03/21 with mesh - diagnostic lap 05/09/21 due to ongoing LLQ ischemia and microperforation requiring resection  - Pain control, diet advancement, and ambulation status per surgery - Postoperative NG/PO management per Sx  - Continue TPN given prolonged NPO status -No flatus or bowel movement over the past 24 hours  Acute intractable nausea vomiting, resolved -Secondary to above, postop NG to low intermittent suction as above  New onset A. fib with RVR, likely provoked given below, moderately well controlled - Cardiology consulted now improving IV Cardizem drip, IV metoprolol 5 mg every 4-6 hours PRN - CHA2DS2-VASc 2 based on age (no clear  history of hypertension and not currently on meds).  Patient is currently going to the OR on an urgent basis.  Cardiology recommending starting Eliquis 5 mg twice daily for anticoagulation when safe from surgical standpoint or prior to discharge. - IV fluids and monitor blood pressure - TSH WNL - Echo - EF 60-65%   Leukocytosis, likely reactive given above, resolved - WBC downtrending appropriately with supportive care/surgery - WNL now   Hyperlipidemia -Resume statin when able to take p.o.   Ascending thoracic aortic aneurysm - CT showing aneurysmal dilatation of the ascending aorta up to 4.8 cm. -Radiologist recommending semiannual imaging follow-up by CTA or MRA -Will need cardiothoracic surgery follow-up outpatient per protocol   DVT prophylaxis: SCDs Code Status: FULL Family Communication: Over the phone  Status is: Inpt  Dispo: The patient is from: Home              Anticipated d/c is to: To be determined              Anticipated d/c date is: >72h              Patient currently NOT medically stable for discharge  Consultants:  General Surgery, Cardiology  Procedures:  Hernia/mesh repair 05/03/21 Exploratory laparoscopy 05/09/2021  Antimicrobials:  None   Subjective: No acute issues or events overnight, denies bowel movement or flatus overnight, abdominal pain improving, otherwise denies shortness of breath chest pain fevers chills headache.  Objective: Vitals:   05/10/21 1200 05/10/21 1645 05/10/21 1945 05/11/21 0344  BP: 119/76 102/64 118/71 93/73  Pulse: 88 (!) 115  93  Resp: '15 15 15 '$ 20  Temp: 98 F (36.7 C) (!) 97.5 F (36.4 C) 97.6 F (36.4 C) 98 F (36.7 C)  TempSrc: Oral Oral Oral Oral  SpO2: 94% 100%  98%  Weight:      Height:        Intake/Output Summary (Last 24 hours) at 05/11/2021 0748 Last data filed at 05/11/2021 0600 Gross per 24 hour  Intake 1177.37 ml  Output 1650 ml  Net -472.63 ml    Filed Weights   05/02/21 1805  Weight: 77.1 kg     Examination:  General:  Pleasantly resting in bed, No acute distress. HEENT:  Normocephalic atraumatic.  Sclerae nonicteric, noninjected.  Extraocular movements intact bilaterally.  NG draining dark green liquid Neck:  Without mass or deformity.  Trachea is midline. Lungs:  Clear to auscultate bilaterally without rhonchi, wheeze, or rales. Heart:  Regular rate and rhythm.  Without murmurs, rubs, or gallops. Abdomen:  Soft, minimally tender postoperative bandage and incision sites clean dry intact. Extremities: Without cyanosis, clubbing, edema, or obvious deformity. Vascular:  Dorsalis pedis and posterior tibial pulses palpable bilaterally. Skin:  Warm and dry, no erythema, no ulcerations.  Data Reviewed: I have personally reviewed following labs and imaging studies  CBC: Recent Labs  Lab 05/06/21 0451 05/08/21 0621 05/10/21 0425  WBC 6.9 9.8 15.0*  HGB 14.5 12.7* 14.1  HCT 43.0 38.9* 41.5  MCV 92.9 94.0 93.0  PLT 180 206 [id number]    Basic Metabolic Panel: Recent Labs  Lab 05/05/21 0152 05/06/21 0451 05/08/21 0621 05/10/21 0425 05/11/21 0032  NA 135 134* 134* 133* 134*  K 3.2* 3.4* 3.5 3.8 3.3*  CL 96* 104 101 103 102  CO2 '30 27 22 '$ 21* 25  GLUCOSE 98 88 83 149* 166*  BUN 39* 24* '16 13 15  '$ CREATININE 1.17 0.93 0.81 0.76 0.78  CALCIUM 8.6* 7.7* 8.2* 8.3* 8.3*  MG  --  1.9  --   --  1.7  PHOS  --   --   --   --  1.7*    GFR: Estimated Creatinine Clearance: 68 mL/min (by C-G formula based on SCr of 0.78 mg/dL). Liver Function Tests: Recent Labs  Lab 05/11/21 0032  AST 20  ALT 21  ALKPHOS 41  BILITOT 0.4  PROT 5.7*  ALBUMIN 2.3*    No results for input(s): LIPASE, AMYLASE in the last 168 hours.  No results for input(s): AMMONIA in the last 168 hours. Coagulation Profile: No results for input(s): INR, PROTIME in the last 168 hours. Cardiac Enzymes: No results for input(s): CKTOTAL, CKMB, CKMBINDEX, TROPONINI in the last 168 hours. BNP (last 3  results) No results for input(s): PROBNP in the last 8760 hours. HbA1C: No results for input(s): HGBA1C in the last 72 hours. CBG: Recent Labs  Lab 05/05/21 2133  GLUCAP 96    Lipid Profile: Recent Labs    05/11/21 0032  TRIG 87   Thyroid Function Tests: No results for input(s): TSH, T4TOTAL, FREET4, T3FREE, THYROIDAB in the last 72 hours.  Anemia Panel: No results for input(s): VITAMINB12, FOLATE, FERRITIN, TIBC, IRON, RETICCTPCT in the last 72 hours. Sepsis Labs: No results for input(s): PROCALCITON, LATICACIDVEN in the last 168 hours.  Recent Results (from the past 240 hour(s))  Resp Panel by RT-PCR (Flu A&B, Covid) Nasopharyngeal Swab     Status: None   Collection Time: 05/02/21  9:40 PM   Specimen: Nasopharyngeal Swab; Nasopharyngeal(NP) swabs in vial transport medium  Result Value Ref Range Status   SARS Coronavirus  2 by RT PCR NEGATIVE NEGATIVE Final    Comment: (NOTE) SARS-CoV-2 target nucleic acids are NOT DETECTED.  The SARS-CoV-2 RNA is generally detectable in upper respiratory specimens during the acute phase of infection. The lowest concentration of SARS-CoV-2 viral copies this assay can detect is 138 copies/mL. A negative result does not preclude SARS-Cov-2 infection and should not be used as the sole basis for treatment or other patient management decisions. A negative result may occur with  improper specimen collection/handling, submission of specimen other than nasopharyngeal swab, presence of viral mutation(s) within the areas targeted by this assay, and inadequate number of viral copies(<138 copies/mL). A negative result must be combined with clinical observations, patient history, and epidemiological information. The expected result is Negative.  Fact Sheet for Patients:  EntrepreneurPulse.com.au  Fact Sheet for Healthcare Providers:  IncredibleEmployment.be  This test is no t yet approved or cleared by the  [place] FDA and  has been authorized for detection and/or diagnosis of SARS-CoV-2 by FDA under an Emergency Use Authorization (EUA). This EUA will remain  in effect (meaning this test can be used) for the duration of the COVID-19 declaration under Section 564(b)(1) of the Act, 21 U.S.C.section 360bbb-3(b)(1), unless the authorization is terminated  or revoked sooner.       Influenza A by PCR NEGATIVE NEGATIVE Final   Influenza B by PCR NEGATIVE NEGATIVE Final    Comment: (NOTE) The Xpert Xpress SARS-CoV-2/FLU/RSV plus assay is intended as an aid in the diagnosis of influenza from Nasopharyngeal swab specimens and should not be used as a sole basis for treatment. Nasal washings and aspirates are unacceptable for Xpert Xpress SARS-CoV-2/FLU/RSV testing.  Fact Sheet for Patients: EntrepreneurPulse.com.au  Fact Sheet for Healthcare Providers: IncredibleEmployment.be  This test is not yet approved or cleared by the [place] FDA and has been authorized for detection and/or diagnosis of SARS-CoV-2 by FDA under an Emergency Use Authorization (EUA). This EUA will remain in effect (meaning this test can be used) for the duration of the COVID-19 declaration under Section 564(b)(1) of the Act, 21 U.S.C. section 360bbb-3(b)(1), unless the authorization is terminated or revoked.  Performed at [facility], [place] [address], [place], [place] [address]           Radiology Studies: [place] EKG SITE RITE  Result Date: 05/10/2021 If [facility] image not attached, placement could not be confirmed due to current cardiac rhythm.   Scheduled Meds:  acetaminophen (TYLENOL) oral liquid 160 mg/5 mL  1,000 mg Per Tube Q6H   enoxaparin (LOVENOX) injection  40 mg Subcutaneous Q24H   pantoprazole (PROTONIX) IV  40 mg Intravenous Q24H   Continuous Infusions:  dextrose 50 mL/hr at 05/10/21 1448   diltiazem (CARDIZEM) infusion 10 mg/hr (05/10/21 2150)    lactated ringers 70 mL/hr at 05/10/21 0117   methocarbamol (ROBAXIN) IV     promethazine (PHENERGAN) injection (IM or IVPB) 70 mL/hr at 05/04/21 0230    LOS: 9 days   Time spent: 58mn  Lajuana Patchell C Milo Solana, DO [facility]  If 7PM-7AM, please contact night-coverage [email or web address]  05/11/2021, 7:48 AM

## 2021-05-12 ENCOUNTER — Inpatient Hospital Stay (HOSPITAL_COMMUNITY): Payer: Medicare Other

## 2021-05-12 DIAGNOSIS — E440 Moderate protein-calorie malnutrition: Secondary | ICD-10-CM | POA: Insufficient documentation

## 2021-05-12 DIAGNOSIS — I4891 Unspecified atrial fibrillation: Secondary | ICD-10-CM | POA: Diagnosis not present

## 2021-05-12 LAB — GLUCOSE, CAPILLARY
Glucose-Capillary: 125 mg/dL — ABNORMAL HIGH (ref 70–99)
Glucose-Capillary: 131 mg/dL — ABNORMAL HIGH (ref 70–99)
Glucose-Capillary: 144 mg/dL — ABNORMAL HIGH (ref 70–99)
Glucose-Capillary: 148 mg/dL — ABNORMAL HIGH (ref 70–99)
Glucose-Capillary: 148 mg/dL — ABNORMAL HIGH (ref 70–99)
Glucose-Capillary: 151 mg/dL — ABNORMAL HIGH (ref 70–99)

## 2021-05-12 LAB — CBC
HCT: 39.9 % (ref 39.0–52.0)
Hemoglobin: 13.5 g/dL (ref 13.0–17.0)
MCH: 31.7 pg (ref 26.0–34.0)
MCHC: 33.8 g/dL (ref 30.0–36.0)
MCV: 93.7 fL (ref 80.0–100.0)
Platelets: 317 10*3/uL (ref 150–400)
RBC: 4.26 MIL/uL (ref 4.22–5.81)
RDW: 13.2 % (ref 11.5–15.5)
WBC: 22.2 10*3/uL — ABNORMAL HIGH (ref 4.0–10.5)
nRBC: 0 % (ref 0.0–0.2)

## 2021-05-12 LAB — COMPREHENSIVE METABOLIC PANEL
ALT: 19 U/L (ref 0–44)
AST: 21 U/L (ref 15–41)
Albumin: 2 g/dL — ABNORMAL LOW (ref 3.5–5.0)
Alkaline Phosphatase: 51 U/L (ref 38–126)
Anion gap: 9 (ref 5–15)
BUN: 15 mg/dL (ref 8–23)
CO2: 25 mmol/L (ref 22–32)
Calcium: 8 mg/dL — ABNORMAL LOW (ref 8.9–10.3)
Chloride: 98 mmol/L (ref 98–111)
Creatinine, Ser: 0.78 mg/dL (ref 0.61–1.24)
GFR, Estimated: >60 mL/min (ref >60)
Glucose, Bld: 100 mg/dL — ABNORMAL HIGH (ref 70–99)
Potassium: 3.7 mmol/L (ref 3.5–5.1)
Sodium: 132 mmol/L — ABNORMAL LOW (ref 135–145)
Total Bilirubin: 0.6 mg/dL (ref 0.3–1.2)
Total Protein: 5.5 g/dL — ABNORMAL LOW (ref 6.5–8.1)

## 2021-05-12 LAB — TRIGLYCERIDES: Triglycerides: 86 mg/dL (ref <150)

## 2021-05-12 LAB — SURGICAL PATHOLOGY

## 2021-05-12 LAB — PHOSPHORUS: Phosphorus: 2.9 mg/dL (ref 2.5–4.6)

## 2021-05-12 LAB — MAGNESIUM: Magnesium: 2 mg/dL (ref 1.7–2.4)

## 2021-05-12 MED ORDER — TRAVASOL 10 % IV SOLN
INTRAVENOUS | Status: AC
  Filled 2021-05-12: qty 792

## 2021-05-12 NOTE — Progress Notes (Signed)
Physical Therapy Treatment Patient Details Name: Gary Rice MRN: [id number] DOB: 1930-12-08 Today's Date: 05/12/2021    History of Present Illness 87 yo male admitted 8/522 after being referred to ED by PCP secondary to incarcerated inguinal hernia causing SBO, also found to be in Afib with RVR in the ED. Received surgical repair of incarcerated inguinal hernia 05/02/21. NGT placed to wall suction 8/6. PMH hernia repair joint replacement.    PT Comments    Pt received in supine, agreeable to therapy session with encouragement and with good participation and tolerance for therapeutic exercises and transfer training. Pt able to perform multiple sit<>stands and bed mobility with mostly Supervision/cues for safety and good safety awareness. Pt c/o 5-6/10 modified RPE (fatigue) at end of session and deferred hallway ambulation but did participate in >30 minutes altogether of supine/seated/standing exercises. Pt continues to benefit from PT services to progress toward functional mobility goals.   Follow Up Recommendations  Home health PT     Equipment Recommendations  Rolling walker with 5" wheels    Recommendations for Other Services       Precautions / Restrictions Precautions Precautions: Fall;Other (comment) Precaution Comments: NGT to wall suction Restrictions Weight Bearing Restrictions: No    Mobility  Bed Mobility Overal bed mobility: Needs Assistance Bed Mobility: Rolling;Sidelying to Sit Rolling: Supervision Sidelying to sit: Supervision;HOB elevated   Sit to supine: Min guard;HOB elevated   General bed mobility comments: cues for technique, pt with fair carryover of instruction but still utilizing core muscles with technique    Transfers Overall transfer level: Needs assistance Equipment used: Rolling walker (2 wheeled) Transfers: Sit to/from [facility] Sit to Stand: Min guard         General transfer comment: Assist for safety and lines.  Pt uses momentum to assist with rise to stand.  Ambulation/Gait             General Gait Details: pt defer due to fatigue after seated/standing exercises   Stairs             Wheelchair Mobility    Modified Rankin (Stroke Patients Only)       Balance Overall balance assessment: Needs assistance Sitting-balance support: Feet supported;No upper extremity supported Sitting balance-Leahy Scale: Good     Standing balance support: During functional activity;Bilateral upper extremity supported Standing balance-Leahy Scale: Fair Standing balance comment: standing exercises pt reliant on RW for support                            Cognition Arousal/Alertness: Awake/alert Behavior During Therapy: WFL for tasks assessed/performed;Agitated Overall Cognitive Status: Within Functional Limits for tasks assessed                                 General Comments: Pt self-directed but participatory as able, noted decreased endurance from previous session but pt seemingly aware and states "I would have more therapy if possible" and pt may do better earlier in the day per his report.      Exercises Other Exercises Other Exercises: standing BLE AROM: heel raises, hip flexion x 10 reps Other Exercises: Serial STS x 5 reps with UE support Other Exercises: seated BLE AROM: LAQ x10 reps Other Exercises: supine BLE AROM: heel slides, hamstring sets, SAQ, hip abduction, hip adduction pillow squeezes, ankle pumps x10 reps ea    General Comments General comments (skin  integrity, edema, etc.): BP 114/62 prior to mobility and denies dizziness with transfers; HR 80's bpm and WNL on RA; NGT remained on wall suction during session; PTA requested that blue geomat cushion be ordered as he was c/o discomfort from chair, had not arrived to unit by end of session will follow-up next date.      Pertinent Vitals/Pain Pain Assessment: Faces Faces Pain Scale: Hurts little  more Pain Location: abdominal discomfort Pain Descriptors / Indicators: Discomfort;Pressure Pain Intervention(s): Limited activity within patient's tolerance;Monitored during session;Repositioned    Home Living                      Prior Function            PT Goals (current goals can now be found in the care plan section) Acute Rehab PT Goals Patient Stated Goal: Get NG tube out and try food PT Goal Formulation: With patient Time For Goal Achievement: 05/15/21 Progress towards PT goals: Progressing toward goals    Frequency    Min 3X/week      PT Plan Current plan remains appropriate    Co-evaluation              AM-PAC PT "6 Clicks" Mobility   Outcome Measure  Help needed turning from your back to your side while in a flat bed without using bedrails?: None Help needed moving from lying on your back to sitting on the side of a flat bed without using bedrails?: A Little Help needed moving to and from a bed to a chair (including a wheelchair)?: A Little Help needed standing up from a chair using your arms (e.g., wheelchair or bedside chair)?: A Little Help needed to walk in hospital room?: A Little Help needed climbing 3-5 steps with a railing? : A Little 6 Click Score: 19    End of Session Equipment Utilized During Treatment: Other (comment) (pt refused gait belt) Activity Tolerance: Patient tolerated treatment well Patient left: in bed;with bed alarm set;with call bell/phone within reach (heels floated) Nurse Communication: Mobility status;Other (comment) (geomat cushion ordered, pt encouraged to lay in semi-sidelying/roll Q2H due to skin breakdown on center of back but he is refusing) PT Visit Diagnosis: Muscle weakness (generalized) (M62.81);Other abnormalities of gait and mobility (R26.89)     Time: [id number] PT Time Calculation (min) (ACUTE ONLY): 34 min  Charges:  $Therapeutic Exercise: 23-37 mins                     Lasundra Hascall P., PTA Acute  Rehabilitation Services Pager: [phone] Office: [place] 05/12/2021, 6:08 PM

## 2021-05-12 NOTE — Progress Notes (Signed)
Initial Nutrition Assessment  DOCUMENTATION CODES:  Non-severe (moderate) malnutrition in context of chronic illness  INTERVENTION:  Continue to advance diet as medically able and as tolerated.  Continue TPN per Pharmacy until patient consistently tolerates a soft diet.  NUTRITION DIAGNOSIS:  Moderate Malnutrition related to chronic illness (recurrent hernias) as evidenced by mild fat depletion, mild muscle depletion, moderate fat depletion, moderate muscle depletion.  GOAL:  Patient will meet greater than or equal to 90% of their needs  MONITOR:  Diet advancement, PO intake, Supplement acceptance, Labs, Weight trends, I & O's  REASON FOR ASSESSMENT:  Consult New TPN/TNA  ASSESSMENT:  87 yo male with a PMH of SVT, HLD, appendectomy, and bilateral inguinal hernias with previous hernia repair who presents with SBO with incarcerated R inguinal hernia. 8/5 - R inguinal hernia repair; NGT placed 8/6 - NGT replaced 8/12 - bowel resection, ex lap  Pt asleep at time of RD meeting. Did not wake to voice or touch.  Pt with no significant weight history in Epic or Care Everywhere. It appears pt has only lost 2 kg in the past 2.5 years, but unsure due to limited documentation.  On exam, pt with some mild-moderate depletions.  Recommend continuing TPN until patient tolerates soft diet consistently with PO intakes >50%.  Medications: reviewed; SSI, Protonix, LR @ 30 ml/hr via IV, morphine PRN (given once today)  Labs: reviewed; Na 132 (L), CBG 131-151 (H) HbA1c: 5.8% (05/11/21)  NUTRITION - FOCUSED PHYSICAL EXAM: Flowsheet Row Most Recent Value  Orbital Region Moderate depletion  Upper Arm Region Mild depletion  Thoracic and Lumbar Region Mild depletion  Buccal Region Moderate depletion  Temple Region Moderate depletion  Clavicle Bone Region Mild depletion  Clavicle and Acromion Bone Region Mild depletion  Scapular Bone Region Unable to assess  Dorsal Hand Moderate depletion   Patellar Region Moderate depletion  Anterior Thigh Region Mild depletion  Posterior Calf Region Moderate depletion  Edema (RD Assessment) None  Hair Reviewed  Eyes Unable to assess  Mouth Unable to assess  Skin Reviewed  Nails Reviewed   Diet Order:   Diet Order             Diet NPO time specified Except for: Ice Chips  Diet effective now                  EDUCATION NEEDS:  Not appropriate for education at this time  Skin:  Skin Assessment: Skin Integrity Issues: Skin Integrity Issues:: Incisions Incisions: R groin, closed and Abdomen, closed  Last BM:  05/07/21 - Type 4, small  Height:  Ht Readings from Last 1 Encounters:  05/02/21 '5\' 11"'$  (1.803 m)   Weight:  Wt Readings from Last 1 Encounters:  05/02/21 77.1 kg   BMI:  Body mass index is 23.71 kg/m.  Estimated Nutritional Needs:  Kcal:  1850-2050 Protein:  85-100 grams Fluid:  >1.85 L  Derrel Nip, RD, LDN (she/her/hers) Registered Dietitian I After-Hours/Weekend Pager # in [place]

## 2021-05-12 NOTE — Progress Notes (Addendum)
PHARMACY - TOTAL PARENTERAL NUTRITION CONSULT NOTE   Indication: Prolonged ileus, SBO  Patient Measurements: Height: '5\' 11"'$  (180.3 cm) Weight: 77.1 kg (170 lb) IBW/kg (Calculated) : 75.3 TPN AdjBW (KG): 77.1 Body mass index is 23.71 kg/m. Usual Weight:    Assessment: Sent by OP MD 8/5 for r/o SBO. New onset afib (CHA2DS2-VASc 2) . CT showing findings consistent with mechanical SBO with transition point related to right inguinal hernia; no perforation. Has been unable to tolerate orals. Start TPN 8/14.  PMH: SVT, hyperlipidemia, appendectomy, bilateral inguinal hernias, previous hernia repair  Glucose / Insulin: CBGs 100-150 with TPN start, 3 units insulin given Electrolytes: Na 132, K 3.7, Phos 2.9, Mg 2 (goal K+>=4, Mg >=2), CoCa wnl Renal: Scr <1 Hepatic: LFT's WNL. Albumin 2.0. TG 86 Intake / Output; MIVF: UOP 0.2 ml/kg/hr down, NG clamped, LBM 8/10  - D10W at 15mhr and LR at 349mhr  GI Meds: IV PPI/24h  GI Imaging: - 8/5: CT showing findings consistent with mechanical SBO with transition point related to right inguinal hernia; no perforation.  Moderate fat and small bowel containing left inguinal hernia without adverse features.  - 8/8: 8/8: Persistent small bowel obstruction, with transition point in the left lower quadrant anteriorly, possibly due to adhesions.  GI Surgeries / Procedures:  8/6:  incarcerated RIGHT inguinal hernia repair with mesh 8/12:  diagnostic lap due to ongoing LLQ ischemia and microperforation requiring resection. Open small bowel resection with primary anastomosis   Central access: PICC 8/14 TPN start date: 8/14  Nutritional Goals (per RD recommendation on  ): kCal:  , Protein:  , Fluid:   Goal TPN rate is   mL/hr (provides   g of protein and   kcals per day)  Current Nutrition:  NPO TPN  Plan:  Increase TPN to 60 mL/hr at 1800 (provides 79g of protein and 1411 kcal) Electrolytes in TPN: Inc Na 6035mL, Inc K 34m18m, Ca 5mEq59m Mg  5mEq/67mand Phos 8mmol/72mCl:Ac 1:1 Add standard MVI and trace elements to TPN Continue Sensitive q4h SSI and adjust as needed  Reduce MIVF to LR at KVO at [facility] BMP, Mg/phos in AM F/u RD assessment for goals  JonathaBertis RuddyD Clinical Pharmacist Please check AMION for all [facility] 05/12/2021 7:39 AM

## 2021-05-12 NOTE — Care Management Important Message (Signed)
Important Message  Patient Details  Name: Nasr Dooms MRN: [id number] Date of Birth: [birth year]   Medicare Important Message Given:  Yes     Shelda Altes 05/12/2021, 9:41 AM

## 2021-05-12 NOTE — Progress Notes (Signed)
3 Days Post-Op  Subjective: Clamped yesterday, but no bowel function.  Feels quite bloated today.    Objective: Vital signs in last 24 hours: Temp:  [98.5 F (36.9 C)-99.1 F (37.3 C)] 98.9 F (37.2 C) (08/15 0836) Pulse Rate:  [100-108] 100 (08/15 0836) Resp:  [12-19] 16 (08/15 0836) BP: (99-128)/(68-82) 117/73 (08/15 0836) SpO2:  [91 %-95 %] 95 % (08/15 0836) Last BM Date: 05/07/21  Intake/Output from previous day: 08/14 0701 - 08/15 0700 In: 1203.6 [P.O.:120; I.V.:1053.6; NG/GT:30] Out: 425 [Urine:425] Intake/Output this shift: No intake/output data recorded.  PE: Abd: distended with anasarca. Appropriately tender. Right inguinal incision clean and dry.  NGT returned to suction with bilious output.  Midline incision honeycomb with wet appearing drainage.  This was removed and foul smelling purulent drainage noted from wound.  All staples were removed.  This was packed with NS WD dressing.  Lab Results:  Recent Labs    05/10/21 0425 05/12/21 0329  WBC 15.0* 22.2*  HGB 14.1 13.5  HCT 41.5 39.9  PLT 260 317   BMET Recent Labs    05/11/21 0032 05/12/21 0329  NA 134* 132*  K 3.3* 3.7  CL 102 98  CO2 25 25  GLUCOSE 166* 100*  BUN 15 15  CREATININE 0.78 0.78  CALCIUM 8.3* 8.0*   PT/INR No results for input(s): LABPROT, INR in the last 72 hours. CMP     Component Value Date/Time   NA 132 (L) 05/12/2021 0329   K 3.7 05/12/2021 0329   CL 98 05/12/2021 0329   CO2 25 05/12/2021 0329   GLUCOSE 100 (H) 05/12/2021 0329   BUN 15 05/12/2021 0329   CREATININE 0.78 05/12/2021 0329   CALCIUM 8.0 (L) 05/12/2021 0329   PROT 5.5 (L) 05/12/2021 0329   ALBUMIN 2.0 (L) 05/12/2021 0329   AST 21 05/12/2021 0329   ALT 19 05/12/2021 0329   ALKPHOS 51 05/12/2021 0329   BILITOT 0.6 05/12/2021 0329   GFRNONAA >60 05/12/2021 0329   GFRAA  08/14/2007 0312    >60        The eGFR has been calculated using the MDRD equation. This calculation has not been validated in all  clinical   Lipase     Component Value Date/Time   LIPASE 27 05/02/2021 1806       Studies/Results: DG CHEST PORT 1 VIEW  Result Date: 05/11/2021 CLINICAL DATA:  Status post PICC line placement. EXAM: PORTABLE CHEST 1 VIEW COMPARISON:  May 03, 2021 FINDINGS: Sh right PICC line terminates in the central SVC. A prominent tortuous thoracic aorta is stable. The heart, hila, and mediastinum are otherwise unremarkable. No pneumothorax. The lungs are clear. IMPRESSION: 1. The right PICC line terminates in the central SVC. 2. Tortuous prominent thoracic aorta, unchanged. 3. The NG tube terminates in the stomach. 4. No other abnormalities. Electronically Signed   By: Dorise Bullion III M.D.   On: 05/11/2021 17:29   [place] EKG SITE RITE  Result Date: 05/10/2021 If Site Rite image not attached, placement could not be confirmed due to current cardiac rhythm.   Anti-infectives: Anti-infectives (From admission, onward)    Start     Dose/Rate Route Frequency Ordered Stop   05/09/21 0915  ceFAZolin (ANCEF) IVPB 2g/100 mL premix        2 g 200 mL/hr over 30 Minutes Intravenous On call to O.R. 05/09/21 0825 05/09/21 1050        Assessment/Plan POD 10, s/p open right inguinal hernia  repair with mesh by Dr. Bobbye Morton for incarcerated RIH with persistent ileus vs SBO. POD 3, s/p dx lap with mini laparotomy with SBR, Dr. Donne Hazel 05/09/21 -continue NGT to LIWS.  NGT confirmed in correct position.  Cont decompression until he has bowel function -midline wound with some bloody purulent drainage.  All staples removed and started on NS WD dressing changes -WBC has bumped to 22K today.  Need to follow closely as if this continues to rise, may need a scan.  Some bump post op is normal, but given early wound infection and persistent elevation will monitor.  No fever.  Will hold on abx at this point. -IS/pulm toilet -mobilize TID in halls, may clamp NGT to do so. -cont TNA and await bowel function   FEN -  NPO/NGT LIWS/TNA VTE - Lovenox ID - none  New onset a fib Ascending thoracic aneurysm   LOS: 10 days    Henreitta Cea , MD [facility] Surgery 05/12/2021, 8:54 AM Please see Amion for pager number during day hours 7:00am-4:30pm or 7:00am -11:30am on weekends

## 2021-05-12 NOTE — Progress Notes (Signed)
PROGRESS NOTE    Lux Cullinan [facility]  [id number] DOB: 1931-05-11 DOA: 05/02/2021 PCP: Josetta Huddle, MD   Brief Narrative:  Gary Rice is a 87 y.o. male with medical history significant of SVT, hyperlipidemia, appendectomy, bilateral inguinal hernias, previous hernia repair presented to the ED complaining of vomiting.  He reported that his right inguinal hernia had popped and he was sent by his doctor for evaluation of possible bowel obstruction.  In the ED, patient found to be in new onset A. fib with rate in the 160s. CT showing findings consistent with mechanical SBO with transition point related to right inguinal hernia; no perforation.  Moderate fat and small bowel containing left inguinal hernia without adverse features.  ED physician was not able to reduce the inguinal hernia.  General surgery recommended NG tube - given concern for incarcerated RIGHT inguinal hernia patient was taken to OR for repair with mesh overnight 05/03/21.  Assessment & Plan:   Principal Problem:   Atrial fibrillation with rapid ventricular response (HCC) Active Problems:   SBO (small bowel obstruction) (HCC)   Leukocytosis   Hyperlipidemia   Thoracic aortic aneurysm (HCC)   SBO with incarcerated R inguinal hernia - Gen Sx following - urgent repair overnight 05/03/21 with mesh - diagnostic lap 05/09/21 due to ongoing LLQ ischemia and microperforation requiring resection  - Pain control, diet advancement, and ambulation status per surgery - Postoperative NG/PO management per Sx  - Continue TPN given prolonged NPO status -No flatus or bowel movement over the past 24 hours  Leukocytosis, worsening - WBC previously downtrending appropriately with supportive care/surgery - Increased overnight - no clear source at this time; surgery indicates superficial infection of midline incision - follow for fever/worsening symptoms/leukocytosis - holding systemic abx at this time - surgery removed staples and cleaned -  plan wet to dry packing  Acute intractable nausea vomiting, resolved -Secondary to above, postop NG to low intermittent suction as above  New onset A. fib with RVR, likely provoked given below, moderately well controlled - Cardiology consulted now improving IV Cardizem drip, IV metoprolol 5 mg every 4-6 hours PRN - CHA2DS2-VASc 2 based on age (no clear history of hypertension and not currently on meds).  Patient is currently going to the OR on an urgent basis.  Cardiology recommending starting Eliquis 5 mg twice daily for anticoagulation when safe from surgical standpoint or prior to discharge. - IV fluids and monitor blood pressure - TSH WNL - Echo - EF 60-65%   Hyperlipidemia -Resume statin when able to take p.o.   Ascending thoracic aortic aneurysm - CT showing aneurysmal dilatation of the ascending aorta up to 4.8 cm. -Radiologist recommending semiannual imaging follow-up by CTA or MRA -Will need cardiothoracic surgery follow-up outpatient per protocol   DVT prophylaxis: SCDs Code Status: FULL Family Communication: Over the phone  Status is: Inpt  Dispo: The patient is from: Home              Anticipated d/c is to: To be determined              Anticipated d/c date is: >72h              Patient currently NOT medically stable for discharge  Consultants:  General Surgery, Cardiology  Procedures:  Hernia/mesh repair 05/03/21 Exploratory laparoscopy 05/09/2021 Midline staples removed - wound cleaned -dressing wet to dry 05/12/21  Antimicrobials:  None   Subjective: No acute issues or events overnight, surgery notes purulent drainage from midline  incision - patient denies pain/itching/irritation, fevers or chills.  Objective: Vitals:   05/11/21 1926 05/11/21 2000 05/11/21 2320 05/12/21 0310  BP: 107/73 107/73 128/82 122/70  Pulse: (!) 108     Resp: '16 19 16 19  '$ Temp: 98.8 F (37.1 C)  99.1 F (37.3 C) 98.8 F (37.1 C)  TempSrc: Oral  Oral Oral  SpO2: 93%  91% 91%   Weight:      Height:        Intake/Output Summary (Last 24 hours) at 05/12/2021 0744 Last data filed at 05/12/2021 0600 Gross per 24 hour  Intake 1203.63 ml  Output 425 ml  Net 778.63 ml    Filed Weights   05/02/21 1805  Weight: 77.1 kg    Examination:  General:  Pleasantly resting in bed, No acute distress. HEENT:  Normocephalic atraumatic.  Sclerae nonicteric, noninjected.  Extraocular movements intact bilaterally.  NG draining dark green liquid Neck:  Without mass or deformity.  Trachea is midline. Lungs:  Clear to auscultate bilaterally without rhonchi, wheeze, or rales. Heart:  Regular rate and rhythm.  Without murmurs, rubs, or gallops. Abdomen:  Soft, minimally tender postoperative bandages and incision sites clean dry intact. Extremities: Without cyanosis, clubbing, edema, or obvious deformity. Vascular:  Dorsalis pedis and posterior tibial pulses palpable bilaterally. Skin:  Warm and dry, no erythema, no ulcerations.  Data Reviewed: I have personally reviewed following labs and imaging studies  CBC: Recent Labs  Lab 05/06/21 0451 05/08/21 0621 05/10/21 0425 05/12/21 0329  WBC 6.9 9.8 15.0* 22.2*  HGB 14.5 12.7* 14.1 13.5  HCT 43.0 38.9* 41.5 39.9  MCV 92.9 94.0 93.0 93.7  PLT 180 206 260 [id number]    Basic Metabolic Panel: Recent Labs  Lab 05/06/21 0451 05/08/21 0621 05/10/21 0425 05/11/21 0032 05/12/21 0329  NA 134* 134* 133* 134* 132*  K 3.4* 3.5 3.8 3.3* 3.7  CL 104 101 103 102 98  CO2 27 22 21* 25 25  GLUCOSE 88 83 149* 166* 100*  BUN 24* '16 13 15 15  '$ CREATININE 0.93 0.81 0.76 0.78 0.78  CALCIUM 7.7* 8.2* 8.3* 8.3* 8.0*  MG 1.9  --   --  1.7 2.0  PHOS  --   --   --  1.7* 2.9    GFR: Estimated Creatinine Clearance: 68 mL/min (by C-G formula based on SCr of 0.78 mg/dL). Liver Function Tests: Recent Labs  Lab 05/11/21 0032 05/12/21 0329  AST 20 21  ALT 21 19  ALKPHOS 41 51  BILITOT 0.4 0.6  PROT 5.7* 5.5*  ALBUMIN 2.3* 2.0*    No  results for input(s): LIPASE, AMYLASE in the last 168 hours.  No results for input(s): AMMONIA in the last 168 hours. Coagulation Profile: No results for input(s): INR, PROTIME in the last 168 hours. Cardiac Enzymes: No results for input(s): CKTOTAL, CKMB, CKMBINDEX, TROPONINI in the last 168 hours. BNP (last 3 results) No results for input(s): PROBNP in the last 8760 hours. HbA1C: Recent Labs    05/11/21 0032  HGBA1C 5.8*   CBG: Recent Labs  Lab 05/05/21 2133 05/11/21 1206 05/11/21 2030 05/11/21 2323 05/12/21 0310  GLUCAP 96 142* 144* 137* 151*    Lipid Profile: Recent Labs    05/11/21 0032 05/12/21 0329  TRIG 87 86    Thyroid Function Tests: No results for input(s): TSH, T4TOTAL, FREET4, T3FREE, THYROIDAB in the last 72 hours.  Anemia Panel: No results for input(s): VITAMINB12, FOLATE, FERRITIN, TIBC, IRON, RETICCTPCT in the last 72 hours.  Sepsis Labs: No results for input(s): PROCALCITON, LATICACIDVEN in the last 168 hours.  Recent Results (from the past 240 hour(s))  Resp Panel by RT-PCR (Flu A&B, Covid) Nasopharyngeal Swab     Status: None   Collection Time: 05/02/21  9:40 PM   Specimen: Nasopharyngeal Swab; Nasopharyngeal(NP) swabs in vial transport medium  Result Value Ref Range Status   SARS Coronavirus 2 by RT PCR NEGATIVE NEGATIVE Final    Comment: (NOTE) SARS-CoV-2 target nucleic acids are NOT DETECTED.  The SARS-CoV-2 RNA is generally detectable in upper respiratory specimens during the acute phase of infection. The lowest concentration of SARS-CoV-2 viral copies this assay can detect is 138 copies/mL. A negative result does not preclude SARS-Cov-2 infection and should not be used as the sole basis for treatment or other patient management decisions. A negative result may occur with  improper specimen collection/handling, submission of specimen other than nasopharyngeal swab, presence of viral mutation(s) within the areas targeted by this assay,  and inadequate number of viral copies(<138 copies/mL). A negative result must be combined with clinical observations, patient history, and epidemiological information. The expected result is Negative.  Fact Sheet for Patients:  EntrepreneurPulse.com.au  Fact Sheet for Healthcare Providers:  IncredibleEmployment.be  This test is no t yet approved or cleared by the [place] FDA and  has been authorized for detection and/or diagnosis of SARS-CoV-2 by FDA under an Emergency Use Authorization (EUA). This EUA will remain  in effect (meaning this test can be used) for the duration of the COVID-19 declaration under Section 564(b)(1) of the Act, 21 U.S.C.section 360bbb-3(b)(1), unless the authorization is terminated  or revoked sooner.       Influenza A by PCR NEGATIVE NEGATIVE Final   Influenza B by PCR NEGATIVE NEGATIVE Final    Comment: (NOTE) The Xpert Xpress SARS-CoV-2/FLU/RSV plus assay is intended as an aid in the diagnosis of influenza from Nasopharyngeal swab specimens and should not be used as a sole basis for treatment. Nasal washings and aspirates are unacceptable for Xpert Xpress SARS-CoV-2/FLU/RSV testing.  Fact Sheet for Patients: EntrepreneurPulse.com.au  Fact Sheet for Healthcare Providers: IncredibleEmployment.be  This test is not yet approved or cleared by the [place] FDA and has been authorized for detection and/or diagnosis of SARS-CoV-2 by FDA under an Emergency Use Authorization (EUA). This EUA will remain in effect (meaning this test can be used) for the duration of the COVID-19 declaration under Section 564(b)(1) of the Act, 21 U.S.C. section 360bbb-3(b)(1), unless the authorization is terminated or revoked.  Performed at [facility], [place] [address] [place], [place] [address]           Radiology Studies: DG CHEST PORT 1 VIEW  Result Date:  05/11/2021 CLINICAL DATA:  Status post PICC line placement. EXAM: PORTABLE CHEST 1 VIEW COMPARISON:  May 03, 2021 FINDINGS: Sh right PICC line terminates in the central SVC. A prominent tortuous thoracic aorta is stable. The heart, hila, and mediastinum are otherwise unremarkable. No pneumothorax. The lungs are clear. IMPRESSION: 1. The right PICC line terminates in the central SVC. 2. Tortuous prominent thoracic aorta, unchanged. 3. The NG tube terminates in the stomach. 4. No other abnormalities. Electronically Signed   By: Dorise Bullion III M.D.   On: 05/11/2021 17:29   [place] EKG SITE RITE  Result Date: 05/10/2021 If Site Rite image not attached, placement could not be confirmed due to current cardiac rhythm.   Scheduled Meds:  acetaminophen (TYLENOL) oral liquid 160 mg/5 mL  1,000 mg Per Tube Q6H   Chlorhexidine Gluconate Cloth  6 each Topical Daily   enoxaparin (LOVENOX) injection  40 mg Subcutaneous Q24H   insulin aspart  0-9 Units Subcutaneous Q4H   pantoprazole (PROTONIX) IV  40 mg Intravenous Q24H   sodium chloride flush  10-40 mL Intracatheter Q12H   Continuous Infusions:  diltiazem (CARDIZEM) infusion 15 mg/hr (05/12/21 0624)   lactated ringers 30 mL/hr at 05/11/21 1900   methocarbamol (ROBAXIN) IV     promethazine (PHENERGAN) injection (IM or IVPB) 70 mL/hr at 05/04/21 0230   TPN ADULT (ION) 40 mL/hr at 05/12/21 0600    LOS: 10 days   Time spent: 31mn  Khalib Fendley C Maelle Sheaffer, DO [facility]  If 7PM-7AM, please contact night-coverage [email or web address]  05/12/2021, 7:44 AM

## 2021-05-13 ENCOUNTER — Inpatient Hospital Stay (HOSPITAL_COMMUNITY): Payer: Medicare Other

## 2021-05-13 DIAGNOSIS — I4891 Unspecified atrial fibrillation: Secondary | ICD-10-CM | POA: Diagnosis not present

## 2021-05-13 LAB — GLUCOSE, CAPILLARY
Glucose-Capillary: 121 mg/dL — ABNORMAL HIGH (ref 70–99)
Glucose-Capillary: 148 mg/dL — ABNORMAL HIGH (ref 70–99)
Glucose-Capillary: 152 mg/dL — ABNORMAL HIGH (ref 70–99)
Glucose-Capillary: 152 mg/dL — ABNORMAL HIGH (ref 70–99)
Glucose-Capillary: 164 mg/dL — ABNORMAL HIGH (ref 70–99)
Glucose-Capillary: 167 mg/dL — ABNORMAL HIGH (ref 70–99)

## 2021-05-13 LAB — BASIC METABOLIC PANEL
Anion gap: 9 (ref 5–15)
BUN: 16 mg/dL (ref 8–23)
CO2: 23 mmol/L (ref 22–32)
Calcium: 8.2 mg/dL — ABNORMAL LOW (ref 8.9–10.3)
Chloride: 100 mmol/L (ref 98–111)
Creatinine, Ser: 0.7 mg/dL (ref 0.61–1.24)
GFR, Estimated: >60 mL/min (ref >60)
Glucose, Bld: 124 mg/dL — ABNORMAL HIGH (ref 70–99)
Potassium: 3.7 mmol/L (ref 3.5–5.1)
Sodium: 132 mmol/L — ABNORMAL LOW (ref 135–145)

## 2021-05-13 LAB — CBC
HCT: 37.5 % — ABNORMAL LOW (ref 39.0–52.0)
Hemoglobin: 12.8 g/dL — ABNORMAL LOW (ref 13.0–17.0)
MCH: 31.4 pg (ref 26.0–34.0)
MCHC: 34.1 g/dL (ref 30.0–36.0)
MCV: 91.9 fL (ref 80.0–100.0)
Platelets: 298 10*3/uL (ref 150–400)
RBC: 4.08 MIL/uL — ABNORMAL LOW (ref 4.22–5.81)
RDW: 13 % (ref 11.5–15.5)
WBC: 22.3 10*3/uL — ABNORMAL HIGH (ref 4.0–10.5)
nRBC: 0 % (ref 0.0–0.2)

## 2021-05-13 LAB — PHOSPHORUS: Phosphorus: 2.7 mg/dL (ref 2.5–4.6)

## 2021-05-13 LAB — MAGNESIUM: Magnesium: 1.9 mg/dL (ref 1.7–2.4)

## 2021-05-13 MED ORDER — POTASSIUM CHLORIDE 10 MEQ/100ML IV SOLN
10.0000 meq | INTRAVENOUS | Status: AC
  Administered 2021-05-13 (×3): 10 meq via INTRAVENOUS
  Filled 2021-05-13 (×3): qty 100

## 2021-05-13 MED ORDER — MIDAZOLAM HCL 2 MG/2ML IJ SOLN
INTRAMUSCULAR | Status: AC | PRN
  Administered 2021-05-13: 1 mg via INTRAVENOUS

## 2021-05-13 MED ORDER — MIDAZOLAM HCL 2 MG/2ML IJ SOLN
INTRAMUSCULAR | Status: AC
  Filled 2021-05-13: qty 2

## 2021-05-13 MED ORDER — FENTANYL CITRATE (PF) 100 MCG/2ML IJ SOLN
INTRAMUSCULAR | Status: AC | PRN
  Administered 2021-05-13: 25 ug via INTRAVENOUS

## 2021-05-13 MED ORDER — PIPERACILLIN-TAZOBACTAM 3.375 G IVPB
3.3750 g | Freq: Three times a day (TID) | INTRAVENOUS | Status: DC
  Administered 2021-05-13 – 2021-05-16 (×8): 3.375 g via INTRAVENOUS
  Filled 2021-05-13 (×8): qty 50

## 2021-05-13 MED ORDER — TRAVASOL 10 % IV SOLN
INTRAVENOUS | Status: AC
  Filled 2021-05-13: qty 924

## 2021-05-13 MED ORDER — IOHEXOL 9 MG/ML PO SOLN
ORAL | Status: AC
  Administered 2021-05-13: 1000 mL
  Filled 2021-05-13: qty 1000

## 2021-05-13 MED ORDER — IOHEXOL 350 MG/ML SOLN
75.0000 mL | Freq: Once | INTRAVENOUS | Status: AC | PRN
  Administered 2021-05-13: 75 mL via INTRAVENOUS

## 2021-05-13 MED ORDER — SODIUM CHLORIDE 0.9% FLUSH
5.0000 mL | Freq: Three times a day (TID) | INTRAVENOUS | Status: DC
  Administered 2021-05-13 – 2021-05-26 (×34): 5 mL

## 2021-05-13 MED ORDER — FENTANYL CITRATE (PF) 100 MCG/2ML IJ SOLN
INTRAMUSCULAR | Status: AC
  Filled 2021-05-13: qty 2

## 2021-05-13 NOTE — Progress Notes (Signed)
PROGRESS NOTE    [facility]  [id number] DOB: 22-Apr-1931 DOA: 05/02/2021 PCP: Josetta Huddle, MD   Brief Narrative:  Gary Rice is a 87 y.o. male with medical history significant of SVT, hyperlipidemia, appendectomy, bilateral inguinal hernias, previous hernia repair presented to the ED complaining of vomiting.  He reported that his right inguinal hernia had popped and he was sent by his doctor for evaluation of possible bowel obstruction.  In the ED, patient found to be in new onset A. fib with rate in the 160s. CT showing findings consistent with mechanical SBO with transition point related to right inguinal hernia; no perforation.  Moderate fat and small bowel containing left inguinal hernia without adverse features.  ED physician was not able to reduce the inguinal hernia.  General surgery recommended NG tube - given concern for incarcerated RIGHT inguinal hernia patient was taken to OR for repair with mesh overnight 05/03/21. Stay complicated by subsequent LLQ SBO with notable ischemia/microperforation, and subsequent abdominal abscess.  Assessment & Plan:   Principal Problem:   Atrial fibrillation with rapid ventricular response (HCC) Active Problems:   SBO (small bowel obstruction) (HCC)   Leukocytosis   Hyperlipidemia   Thoracic aortic aneurysm (HCC)   Malnutrition of moderate degree  SBO with incarcerated R inguinal hernia - Gen Sx following - urgent repair overnight 05/03/21 with mesh - diagnostic lap 05/09/21 due to ongoing LLQ ischemia and microperforation requiring resection  - Pain control, diet advancement, and ambulation status per surgery - Postoperative NG/PO management per Sx  - Continue TPN given prolonged NPO status - No flatus or bowel movement over the past 24 hours  Intra-abdominal fluid collection - rule out abscess - Noted on CT 8/16  - Continue Zosyn per discussion with Gen Sx - IR consulted for fluid drainage, will follow cultures  Acute  intractable nausea vomiting, stable - Secondary to above, postop NG to low intermittent suction as above - symptoms controlled with NG to suction  New onset A. fib with RVR, likely provoked given below, moderately well controlled - Continue IV Cardizem drip, IV metoprolol 5 mg every 4-6 hours PRN - CHA2DS2-VASc 2 based on age (no clear history of hypertension and not currently on meds).  Patient is currently going to the OR on an urgent basis.  Cardiology recommending starting Eliquis 5 mg twice daily for anticoagulation when safe from surgical standpoint or prior to discharge. - IV fluids and monitor blood pressure - TSH WNL - Echo - EF 60-65%   Hyperlipidemia -Resume statin when able to take p.o.   Ascending thoracic aortic aneurysm - CT showing aneurysmal dilatation of the ascending aorta up to 4.8 cm. - Radiologist recommending semiannual imaging follow-up by CTA or MRA - Will need cardiothoracic surgery follow-up outpatient per protocol   DVT prophylaxis: SCDs Code Status: FULL Family Communication: Over the phone  Status is: Inpt  Dispo: The patient is from: Home              Anticipated d/c is to: To be determined              Anticipated d/c date is: >72h              Patient currently NOT medically stable for discharge  Consultants:  General Surgery, Cardiology  Procedures:  Hernia/mesh repair 05/03/21 Exploratory laparoscopy 05/09/2021 Midline staples removed - wound cleaned -dressing wet to dry 05/12/21 Tentatively planned IR abdominal abscess drainage  Antimicrobials:  None   Subjective: Worsening  abdominal distention overnight, no fever chills headache chest pain or shortness of breath  Objective: Vitals:   05/12/21 1635 05/12/21 2011 05/12/21 2345 05/13/21 0336  BP: 114/62 117/69 127/82 129/69  Pulse: 81 86 88 90  Resp: '16 15 20 18  '$ Temp: 97.8 F (36.6 C) 98.8 F (37.1 C) 98.6 F (37 C) 98.4 F (36.9 C)  TempSrc: Oral Oral Oral Oral  SpO2: 95% 94% 96%  93%  Weight:      Height:        Intake/Output Summary (Last 24 hours) at 05/13/2021 0725 Last data filed at 05/13/2021 0600 Gross per 24 hour  Intake 2306.97 ml  Output 1130 ml  Net 1176.97 ml    Filed Weights   05/02/21 1805  Weight: 77.1 kg    Examination:  General:  Pleasantly resting in bed, No acute distress. HEENT:  Normocephalic atraumatic.  Sclerae nonicteric, noninjected.  Extraocular movements intact bilaterally.  NG draining dark green liquid Neck:  Without mass or deformity.  Trachea is midline. Lungs:  Clear to auscultate bilaterally without rhonchi, wheeze, or rales. Heart:  Regular rate and rhythm.  Without murmurs, rubs, or gallops. Abdomen:  Distended and minimally tympanic, diffusely tender. Extremities: Without cyanosis, clubbing, edema, or obvious deformity. Vascular:  Dorsalis pedis and posterior tibial pulses palpable bilaterally. Skin:  Warm and dry, no erythema, no ulcerations.  Data Reviewed: I have personally reviewed following labs and imaging studies  CBC: Recent Labs  Lab 05/08/21 0621 05/10/21 0425 05/12/21 0329 05/13/21 0355  WBC 9.8 15.0* 22.2* 22.3*  HGB 12.7* 14.1 13.5 12.8*  HCT 38.9* 41.5 39.9 37.5*  MCV 94.0 93.0 93.7 91.9  PLT 206 260 317 [id number]    Basic Metabolic Panel: Recent Labs  Lab 05/08/21 0621 05/10/21 0425 05/11/21 0032 05/12/21 0329 05/13/21 0355  NA 134* 133* 134* 132* 132*  K 3.5 3.8 3.3* 3.7 3.7  CL 101 103 102 98 100  CO2 22 21* '25 25 23  '$ GLUCOSE 83 149* 166* 100* 124*  BUN '16 13 15 15 16  '$ CREATININE 0.81 0.76 0.78 0.78 0.70  CALCIUM 8.2* 8.3* 8.3* 8.0* 8.2*  MG  --   --  1.7 2.0 1.9  PHOS  --   --  1.7* 2.9 2.7    GFR: Estimated Creatinine Clearance: 68 mL/min (by C-G formula based on SCr of 0.7 mg/dL). Liver Function Tests: Recent Labs  Lab 05/11/21 0032 05/12/21 0329  AST 20 21  ALT 21 19  ALKPHOS 41 51  BILITOT 0.4 0.6  PROT 5.7* 5.5*  ALBUMIN 2.3* 2.0*    No results for input(s):  LIPASE, AMYLASE in the last 168 hours.  No results for input(s): AMMONIA in the last 168 hours. Coagulation Profile: No results for input(s): INR, PROTIME in the last 168 hours. Cardiac Enzymes: No results for input(s): CKTOTAL, CKMB, CKMBINDEX, TROPONINI in the last 168 hours. BNP (last 3 results) No results for input(s): PROBNP in the last 8760 hours. HbA1C: Recent Labs    05/11/21 0032  HGBA1C 5.8*    CBG: Recent Labs  Lab 05/12/21 1144 05/12/21 1611 05/12/21 2116 05/12/21 2347 05/13/21 0351  GLUCAP 148* 125* 148* 144* 121*    Lipid Profile: Recent Labs    05/11/21 0032 05/12/21 0329  TRIG 87 86    Thyroid Function Tests: No results for input(s): TSH, T4TOTAL, FREET4, T3FREE, THYROIDAB in the last 72 hours.  Anemia Panel: No results for input(s): VITAMINB12, FOLATE, FERRITIN, TIBC, IRON, RETICCTPCT in the last 72  hours. Sepsis Labs: No results for input(s): PROCALCITON, LATICACIDVEN in the last 168 hours.  No results found for this or any previous visit (from the past 240 hour(s)).  Radiology Studies: DG CHEST PORT 1 VIEW  Result Date: 05/11/2021 CLINICAL DATA:  Status post PICC line placement. EXAM: PORTABLE CHEST 1 VIEW COMPARISON:  May 03, 2021 FINDINGS: Sh right PICC line terminates in the central SVC. A prominent tortuous thoracic aorta is stable. The heart, hila, and mediastinum are otherwise unremarkable. No pneumothorax. The lungs are clear. IMPRESSION: 1. The right PICC line terminates in the central SVC. 2. Tortuous prominent thoracic aorta, unchanged. 3. The NG tube terminates in the stomach. 4. No other abnormalities. Electronically Signed   By: Dorise Bullion III M.D.   On: 05/11/2021 17:29   DG Abd Portable 1V  Result Date: 05/12/2021 CLINICAL DATA:  Enteric tube placement. EXAM: PORTABLE ABDOMEN - 1 VIEW COMPARISON:  Abdominal x-ray dated May 07, 2021. FINDINGS: Unchanged enteric tube within the stomach. Mildly improved diffuse small bowel  dilatation. Oral contrast again noted within the colon. IMPRESSION: 1. Unchanged enteric tube within the stomach. 2. Mildly improved ileus. Electronically Signed   By: Titus Dubin M.D.   On: 05/12/2021 08:53    Scheduled Meds:  acetaminophen (TYLENOL) oral liquid 160 mg/5 mL  1,000 mg Per Tube Q6H   Chlorhexidine Gluconate Cloth  6 each Topical Daily   enoxaparin (LOVENOX) injection  40 mg Subcutaneous Q24H   insulin aspart  0-9 Units Subcutaneous Q4H   pantoprazole (PROTONIX) IV  40 mg Intravenous Q24H   sodium chloride flush  10-40 mL Intracatheter Q12H   Continuous Infusions:  diltiazem (CARDIZEM) infusion 15 mg/hr (05/13/21 0050)   lactated ringers 30 mL/hr at 05/12/21 1924   methocarbamol (ROBAXIN) IV     promethazine (PHENERGAN) injection (IM or IVPB) 70 mL/hr at 05/04/21 0230   TPN ADULT (ION) 60 mL/hr at 05/12/21 1745    LOS: 11 days   Time spent: 39mn  Joanathan Affeldt C Edrian Melucci, DO [facility]  If 7PM-7AM, please contact night-coverage [email or web address]  05/13/2021, 7:25 AM

## 2021-05-13 NOTE — Progress Notes (Signed)
4 Days Post-Op  Subjective: No bowel function.  Patient is still very distended today despite NGT being in correct position on film yesterday.  Objective: Vital signs in last 24 hours: Temp:  [97.8 F (36.6 C)-98.9 F (37.2 C)] 98.6 F (37 C) (08/16 0725) Pulse Rate:  [81-102] 89 (08/16 0725) Resp:  [12-20] 17 (08/16 0725) BP: (114-129)/(62-82) 129/69 (08/16 0336) SpO2:  [93 %-96 %] 93 % (08/16 0725) Last BM Date: 05/07/21  Intake/Output from previous day: 08/15 0701 - 08/16 0700 In: 2307 [P.O.:40; I.V.:2267] Out: 1130 [Urine:880; Emesis/NG output:250] Intake/Output this shift: No intake/output data recorded.  PE: Abd: distended still despite NGT. Appropriately tender. Right inguinal incision clean and dry.  NGT with some bilious output.  Not as much as expected given amount of distention. Midline incision still with some malodor today and a small amount of purulent fluid on guaze, but overall cleaning up some.  Lab Results:  Recent Labs    05/12/21 0329 05/13/21 0355  WBC 22.2* 22.3*  HGB 13.5 12.8*  HCT 39.9 37.5*  PLT 317 298   BMET Recent Labs    05/12/21 0329 05/13/21 0355  NA 132* 132*  K 3.7 3.7  CL 98 100  CO2 25 23  GLUCOSE 100* 124*  BUN 15 16  CREATININE 0.78 0.70  CALCIUM 8.0* 8.2*   PT/INR No results for input(s): LABPROT, INR in the last 72 hours. CMP     Component Value Date/Time   NA 132 (L) 05/13/2021 0355   K 3.7 05/13/2021 0355   CL 100 05/13/2021 0355   CO2 23 05/13/2021 0355   GLUCOSE 124 (H) 05/13/2021 0355   BUN 16 05/13/2021 0355   CREATININE 0.70 05/13/2021 0355   CALCIUM 8.2 (L) 05/13/2021 0355   PROT 5.5 (L) 05/12/2021 0329   ALBUMIN 2.0 (L) 05/12/2021 0329   AST 21 05/12/2021 0329   ALT 19 05/12/2021 0329   ALKPHOS 51 05/12/2021 0329   BILITOT 0.6 05/12/2021 0329   GFRNONAA >60 05/13/2021 0355   GFRAA  08/14/2007 0312    >60        The eGFR has been calculated using the MDRD equation. This calculation has not  been validated in all clinical   Lipase     Component Value Date/Time   LIPASE 27 05/02/2021 1806       Studies/Results: DG CHEST PORT 1 VIEW  Result Date: 05/11/2021 CLINICAL DATA:  Status post PICC line placement. EXAM: PORTABLE CHEST 1 VIEW COMPARISON:  May 03, 2021 FINDINGS: Sh right PICC line terminates in the central SVC. A prominent tortuous thoracic aorta is stable. The heart, hila, and mediastinum are otherwise unremarkable. No pneumothorax. The lungs are clear. IMPRESSION: 1. The right PICC line terminates in the central SVC. 2. Tortuous prominent thoracic aorta, unchanged. 3. The NG tube terminates in the stomach. 4. No other abnormalities. Electronically Signed   By: Dorise Bullion III M.D.   On: 05/11/2021 17:29   DG Abd Portable 1V  Result Date: 05/12/2021 CLINICAL DATA:  Enteric tube placement. EXAM: PORTABLE ABDOMEN - 1 VIEW COMPARISON:  Abdominal x-ray dated May 07, 2021. FINDINGS: Unchanged enteric tube within the stomach. Mildly improved diffuse small bowel dilatation. Oral contrast again noted within the colon. IMPRESSION: 1. Unchanged enteric tube within the stomach. 2. Mildly improved ileus. Electronically Signed   By: Titus Dubin M.D.   On: 05/12/2021 08:53    Anti-infectives: Anti-infectives (From admission, onward)    Start  Dose/Rate Route Frequency Ordered Stop   05/09/21 0915  ceFAZolin (ANCEF) IVPB 2g/100 mL premix        2 g 200 mL/hr over 30 Minutes Intravenous On call to O.R. 05/09/21 0825 05/09/21 1050        Assessment/Plan POD 11, s/p open right inguinal hernia repair with mesh by Dr. Bobbye Morton for incarcerated RIH with persistent ileus vs SBO. POD 4, s/p dx lap with mini laparotomy with SBR, Dr. Donne Hazel 05/09/21 -continue NGT to LIWS.  NGT confirmed in correct position.  Cont decompression until he has bowel function -cont BID WD dressing changes to midline wound -WBC still 22K today.  CT scan to eval for post op complication or  infection.  If he has evidence will initiate abx therapy. -IS/pulm toilet -mobilize TID in halls, may clamp NGT to do so. -cont TNA and await bowel function   FEN - NPO/NGT LIWS/TNA VTE - Lovenox ID - none  New onset a fib Ascending thoracic aneurysm   LOS: 11 days    Henreitta Cea , MD [facility] Surgery 05/13/2021, 8:31 AM Please see Amion for pager number during day hours 7:00am-4:30pm or 7:00am -11:30am on weekends

## 2021-05-13 NOTE — Procedures (Signed)
Interventional Radiology Procedure Note  Procedure: CT guided drain placement  Indication: Pelvic fluid collection  Findings:  10.2 fr drain placed in pelvic fluid collection.  Samples sent for gram stain and culture.  Please refer to procedural dictation for full description.  Complications: None  EBL: < 10 mL  Miachel Roux, MD [phone]

## 2021-05-13 NOTE — Progress Notes (Signed)
PHARMACY - TOTAL PARENTERAL NUTRITION CONSULT NOTE   Indication: Prolonged ileus, SBO  Patient Measurements: Height: '5\' 11"'$  (180.3 cm) Weight: 77.1 kg (170 lb) IBW/kg (Calculated) : 75.3 TPN AdjBW (KG): 77.1 Body mass index is 23.71 kg/m. Usual Weight:    Assessment: Sent by OP MD 8/5 for r/o SBO. New onset afib (CHA2DS2-VASc 2) . CT showing findings consistent with mechanical SBO with transition point related to right inguinal hernia; no perforation. Has been unable to tolerate orals. Start TPN 8/14.  PMH: SVT, hyperlipidemia, appendectomy, bilateral inguinal hernias, previous hernia repair  Glucose / Insulin: CBGs 120-140s, 4 units insulin given Electrolytes: Na 132, K 3.7, Phos 2.7, Mg 1.9 (goal K+>=4, Mg >=2), CoCa wnl Renal: Scr <1 Hepatic: LFT's WNL. Albumin 2.0. TG 86 Intake / Output; MIVF: UOP 0.2>0.5 ml/kg/hr down, NG 278m, LBM 8/10  LR '@KVO'$   GI Meds: IV PPI/24h  GI Imaging: - 8/5: CT showing findings consistent with mechanical SBO with transition point related to right inguinal hernia; no perforation.  Moderate fat and small bowel containing left inguinal hernia without adverse features.  - 8/8: 8/8: Persistent small bowel obstruction, with transition point in the left lower quadrant anteriorly, possibly due to adhesions.  GI Surgeries / Procedures:  8/6:  incarcerated RIGHT inguinal hernia repair with mesh 8/12:  diagnostic lap due to ongoing LLQ ischemia and microperforation requiring resection. Open small bowel resection with primary anastomosis   Central access: PICC 8/14 TPN start date: 8/14  Nutritional Goals (per RD recommendation on 8/15): kCal: 1850-2050, Protein: 85-100g, Fluid:  >1.85L Goal TPN rate is  70 mL/hr (provides 92 g of protein and  1851 kcals per day)  Current Nutrition:  NPO TPN  Plan:  Increase TPN to 70 mL/hr at 1800  Electrolytes in TPN: Inc Na 645m/L, Inc K 3580mL, Ca 5mE71m, Inc Mg 8mEq29m and Phos 8mmol36m Cl:Ac 1:1 Add  standard MVI and trace elements to TPN Continue Sensitive q4h SSI and adjust as needed  Give KCl 10meq 49m 3 BMP, Mg/phos in AM  JonathaBertis RuddyD Clinical Pharmacist Please check AMION for all [facility] PharGreenes 05/13/2021 7:29 AM

## 2021-05-13 NOTE — Consult Note (Signed)
Chief Complaint: Intra abdominal abscess. Request is for pelvic abscess drain placement  Referring Physician(s): Saverio Danker PA  Supervising Physician: Mir, Sharen Heck  Patient Status: [facility] - In-pt  History of Present Illness: Gary Rice is a 87 y.o. male History of SVT, HLD, appendectomy with bilateral inguinal hernia. S/p right inguinal hernia repair on 8.5.22 and small bowel resection on 8.12.22. Now with no bowel function and a dissented abdomen. CT abd from 8.16.22 reads Large rim enhancing fluid collection in the pelvis measuring approximately 12 x 11 x 10 cm consistent with a pelvic abscess. Team is requesting a pelvic abscess.    Currently without any significant complaints. Patient alert and laying in bed, calm and comfortable. States that he is eager to have the procedure performed. Return precautions and treatment recommendations and follow-up discussed with the patient who is agreeable with the plan.   History reviewed. No pertinent past medical history.  Past Surgical History:  Procedure Laterality Date   APPENDECTOMY     age 89   BOWEL RESECTION  05/09/2021   Procedure: SMALL BOWEL RESECTION;  Surgeon: Rolm Bookbinder, MD;  Location: [place];  Service: General;;   CATARACT EXTRACTION, BILATERAL     HERNIA REPAIR     RIH   INGUINAL HERNIA REPAIR Right 05/02/2021   Procedure: REPAIR  INCARCERATED INGUINAL HERNIA  WITH MESH;  Surgeon: Jesusita Oka, MD;  Location: [place];  Service: General;  Laterality: Right;   JOINT REPLACEMENT Right    RTHA   LAPAROSCOPY N/A 05/09/2021   Procedure: LAPAROSCOPY DIAGNOSTIC;  Surgeon: Rolm Bookbinder, MD;  Location: [place];  Service: General;  Laterality: N/A;   LAPAROTOMY  05/09/2021   Procedure: EXPLORATORY LAPAROTOMY;  Surgeon: Rolm Bookbinder, MD;  Location: [place];  Service: General;;   TONSILLECTOMY      Allergies: Patient has no known allergies.  Medications: Prior to Admission medications   Medication Sig  Start Date End Date Taking? Authorizing Provider  aspirin EC 81 MG tablet Take 81 mg by mouth at bedtime.   Yes [provider]  atorvastatin (LIPITOR) 10 MG tablet Take 10 mg by mouth at bedtime. 08/14/15  Yes [provider]  diltiazem (CARDIZEM) 30 MG tablet TAKE 1 TABLET AS NEEDED FOR RAPID HEART BEAT Patient taking differently: Take 30 mg by mouth daily as needed (rapid heart beat). 10/06/17  Yes Isaiah Serge, NP  fluticasone (FLONASE) 50 MCG/ACT nasal spray Place 1 spray into both nostrils daily. 08/14/15  Yes [provider]  Multiple Vitamin (MULTIVITAMIN WITH MINERALS) TABS tablet Take 1 tablet by mouth daily.   Yes [provider]  Multiple Vitamins-Minerals (PRESERVISION AREDS 2) CAPS Take 1 capsule 2 (two) times daily by mouth.   Yes [provider]  VITAMIN E PO Take 1 capsule by mouth daily.    Yes [provider]     Family History  Problem Relation Age of Onset   Heart disease Father    Heart attack Father    Heart attack Son    Sleep apnea Son     Social History   Socioeconomic History   Marital status: Married    Spouse name: Not on file   Number of children: Not on file   Years of education: Not on file   Highest education level: Not on file  Occupational History   Not on file  Tobacco Use   Smoking status: Former    Types: Cigarettes    Quit date: 10/24/1950  Years since quitting: 70.6   Smokeless tobacco: Never  Vaping Use   Vaping Use: Never used  Substance and Sexual Activity   Alcohol use: No   Drug use: No   Sexual activity: Not on file  Other Topics Concern   Not on file  Social History Narrative   Not on file   Social Determinants of Health   Financial Resource Strain: Not on file  Food Insecurity: Not on file  Transportation Needs: Not on file  Physical Activity: Not on file  Stress: Not on file  Social Connections: Not on file    Review of Systems: A 12 point ROS discussed and  pertinent positives are indicated in the HPI above.  All other systems are negative.  Review of Systems  Constitutional:  Negative for fever.  HENT:  Negative for congestion.   Respiratory:  Negative for cough and shortness of breath.   Cardiovascular:  Negative for chest pain.  Gastrointestinal:  Negative for abdominal pain.  Neurological:  Negative for headaches.  Psychiatric/Behavioral:  Negative for behavioral problems and confusion.    Vital Signs: BP 125/67 (BP Location: Left Arm)   Pulse 77   Temp 98.9 F (37.2 C) (Oral)   Resp 20   Ht '5\' 11"'$  (1.803 m)   Wt 170 lb (77.1 kg)   SpO2 91%   BMI 23.71 kg/m   Physical Exam Vitals and nursing note reviewed.  Constitutional:      Appearance: He is well-developed.  HENT:     Head: Normocephalic.  Cardiovascular:     Rate and Rhythm: Normal rate and regular rhythm.     Heart sounds: Normal heart sounds.  Pulmonary:     Effort: Pulmonary effort is normal.     Breath sounds: Normal breath sounds.  Abdominal:     General: There is distension.     Comments: NG tube in left nare  Musculoskeletal:        General: Normal range of motion.     Cervical back: Normal range of motion.  Skin:    General: Skin is dry.  Neurological:     Mental Status: He is alert and oriented to person, place, and time.    Imaging: CT ABDOMEN PELVIS WO CONTRAST  Result Date: 05/05/2021 CLINICAL DATA:  Bowel obstruction suspected EXAM: CT ABDOMEN AND PELVIS WITHOUT CONTRAST TECHNIQUE: Multidetector CT imaging of the abdomen and pelvis was performed following the standard protocol without IV contrast. COMPARISON:  CT 05/02/2021 FINDINGS: Lower chest: Partially visualized ascending aorta measures 4.4 cm, this measured up to 4.8 cm on prior dedicated CTA of the chest. Small right and trace left pleural effusions with adjacent right basilar atelectasis. Coronary artery calcifications. Hepatobiliary: Atrophic left hepatic lobe. Layering high density in the  gallbladder, likely hyperdense sludge and small stones. Pancreas: Unremarkable. No pancreatic ductal dilatation or surrounding inflammatory changes. Spleen: Normal in size without focal abnormality. Adrenals/Urinary Tract: Adrenal glands are unremarkable. No hydronephrosis. Unchanged left renal cysts. Unchanged right renal cyst. No nephrolithiasis. Unchanged bladder dome diverticulum. Stomach/Bowel: There is a nasogastric tube with tip in the stomach. The stomach is unremarkable. There are multiple dilated loops of small bowel, with transition point in the left lower quadrant anteriorly (series 3, image 58 and series 6, image 36). There is a large rectal stool ball. Vascular/Lymphatic: Aortoiliac atherosclerotic calcifications. No AAA. Reproductive: Enlarged prostate. Other: There is a left inguinal hernia containing small bowel. Postsurgical changes of recent open right inguinal hernia repair. There is no evidence  of residual bowel in the right inguinal canal. There are postsurgical inflammatory changes in this region and small foci of gas. Musculoskeletal: Prior right hip arthroplasty. No acute osseous abnormality. Dextroconvex curvature of the lumbar spine. Multilevel degenerative changes of the spine, no acute osseous abnormality. No suspicious lytic or blastic lesions. IMPRESSION: Persistent small bowel obstruction, with transition point in the left lower quadrant anteriorly, possibly due to adhesions. Postsurgical changes of recent open right inguinal hernia repair without recurrence. Unchanged small bowel containing left inguinal hernia, which appears unrelated to the current bowel obstruction. Unchanged small right and trace left pleural effusions. Unchanged layering high density in the gallbladder, likely hyperdense sludge and small stones. Electronically Signed   By: Maurine Simmering   On: 05/05/2021 16:17   CT Angio Chest PE W and/or Wo Contrast  Result Date: 05/02/2021 CLINICAL DATA:  Blockage unable to  tolerate p.o. EXAM: CT ANGIOGRAPHY CHEST CT ABDOMEN AND PELVIS WITH CONTRAST TECHNIQUE: Multidetector CT imaging of the chest was performed using the standard protocol during bolus administration of intravenous contrast. Multiplanar CT image reconstructions and MIPs were obtained to evaluate the vascular anatomy. Multidetector CT imaging of the abdomen and pelvis was performed using the standard protocol during bolus administration of intravenous contrast. CONTRAST:  176m OMNIPAQUE IOHEXOL 350 MG/ML SOLN COMPARISON:  Radiography 05/02/2021 FINDINGS: CTA CHEST FINDINGS Cardiovascular: Satisfactory opacification of the pulmonary arteries to the segmental level. No evidence of pulmonary embolism. Mild aortic atherosclerosis. Aneurysmal dilatation of the ascending aorta up to 4.8 cm. Normal cardiac size. Mild coronary vascular calcification. No pericardial effusion Mediastinum/Nodes: No enlarged mediastinal, hilar, or axillary lymph nodes. Thyroid gland, trachea, and esophagus demonstrate no significant findings. Lungs/Pleura: Lungs are clear. No pleural effusion or pneumothorax. Musculoskeletal: No chest wall abnormality. No acute or significant osseous findings. Review of the MIP images confirms the above findings. CT ABDOMEN and PELVIS FINDINGS Hepatobiliary: Hyperdense sludge or excreted contrast within the gallbladder. No biliary dilatation. Subcentimeter hypodensities in the liver too small to further characterize. Small cyst in the left hepatic lobe. Pancreas: Unremarkable. No pancreatic ductal dilatation or surrounding inflammatory changes. Spleen: Normal in size without focal abnormality. Adrenals/Urinary Tract: Adrenal glands are normal. Bilateral renal cysts. 9.4 cm cyst in the upper pole left kidney. The bladder is unremarkable Stomach/Bowel: Fluid distension of the stomach, proximal and mid small bowel consistent with obstruction. Transition point related to right inguinal hernia containing fat and short  segment of small bowel. Small bowel distal to the hernia is decompressed. No acute bowel wall thickening. Vascular/Lymphatic: Moderate aortic atherosclerosis. No aneurysm. No suspicious nodes Reproductive: Enlarged prostate Other: Moderate left inguinal hernia containing fat and decompressed small bowel. Musculoskeletal: Orthopedic hardware right hip with artifact. No acute osseous abnormality Review of the MIP images confirms the above findings. IMPRESSION: 1. Negative for acute pulmonary embolus.  Clear lung fields. 2. Findings consistent with mechanical small bowel obstruction, transition point related to right inguinal hernia. No perforation. 3. Moderate fat and small bowel containing left inguinal hernia without adverse features 4. Aneurysmal dilatation of the ascending aorta up to 4.8 cm. Ascending thoracic aortic aneurysm. Recommend semi-annual imaging followup by CTA or MRA and referral to cardiothoracic surgery if not already obtained. This recommendation follows 2010 ACCF/AHA/AATS/ACR/ASA/SCA/SCAI/SIR/STS/SVM Guidelines for the Diagnosis and Management of Patients With Thoracic Aortic Disease. Circulation. 2010; 121:[id number] Aortic aneurysm NOS (ICD10-I71.9) Electronically Signed   By: KDonavan FoilM.D.   On: 05/02/2021 19:56   CT ABDOMEN PELVIS W CONTRAST  Result Date: 05/13/2021 CLINICAL  DATA:  History of right inguinal hernia repair and small-bowel resection. Leukocytosis. EXAM: CT ABDOMEN AND PELVIS WITH CONTRAST TECHNIQUE: Multidetector CT imaging of the abdomen and pelvis was performed using the standard protocol following bolus administration of intravenous contrast. CONTRAST:  80m OMNIPAQUE IOHEXOL 350 MG/ML SOLN COMPARISON:  CT scan 05/05/2021 FINDINGS: Lower chest: Small bilateral pleural effusions, right larger than left with overlying atelectasis. No definite infiltrates or pneumothorax. Stable 4 mm left lower lobe pulmonary nodule. The heart is normal in size. No pericardial effusion.  There is an NG tube coursing down the esophagus and into the stomach. Stable aortic and coronary artery calcifications. Hepatobiliary: A few small scattered hepatic cysts are again noted. No worrisome hepatic lesions or intrahepatic biliary dilatation. Gallbladder demonstrates layering gallstones but no findings for acute cholecystitis. No common bile duct dilatation. Pancreas: No mass, inflammation or ductal dilatation. Spleen: Normal size.  No focal lesions. Adrenals/Urinary Tract: The adrenal glands unremarkable stable. Stable renal cysts. No worrisome renal lesions or hydronephrosis. There is gas in the bladder are likely from recent catheterization. Stomach/Bowel: The stomach is unremarkable. It contains an NG tube. The duodenum is unremarkable. Slightly distended small bowel loops but no obstructive findings. Contrast gets all the way to the colon. Postoperative changes in the left lower quadrant with anastomosis sutures. There is surrounding small enhancing fluid collections and significant inflammatory type changes. There is also a small amount of free air and. There is a slightly larger interloop rim enhancing fluid collection measuring 3 cm on image number 58/5 which contains a small amount of gas. This is likely a small abscess. There is a large rim enhancing fluid collection in the pelvis measuring approximately 12 x 11 x 10 cm and is located mainly between the bladder and the rectum. I do not see any gas in this fluid collection but it is consistent with a pelvic abscess. Vascular/Lymphatic: Stable vascular calcifications. Scattered mesenteric and retroperitoneal lymph nodes but no mass or overt adenopathy the. Reproductive: The prostate gland and seminal vesicles are grossly normal. Other: Surgical changes from recent right inguinal hernia repair with small amount fluid and gas. The large left inguinal hernia is stable. It contains small bowel loops but no findings for incarceration or obstruction.  Musculoskeletal: Stable scoliosis and advanced degenerative lumbar spondylosis. No acute bony findings. IMPRESSION: 1. Postoperative changes in the left lower quadrant from recent small bowel resection with anastomosis sutures. Small surrounding rim enhancing fluid collections and marked inflammatory changes consistent with small abscesses. No leaking oral contrast is demonstrated. 2. Large rim enhancing fluid collection in the pelvis measuring approximately 12 x 11 x 10 cm consistent with a pelvic abscess. 3. Small bilateral pleural effusions, right larger than left with overlying atelectasis. 4. Cholelithiasis. 5. Stable large left inguinal hernia containing small bowel loops but no findings for incarceration or obstruction. 6. Stable 4 mm left lower lobe pulmonary nodule. These results will be called to the ordering clinician or representative by the Radiologist Assistant, and communication documented in the PACS or [employer] Aortic Atherosclerosis (ICD10-I70.0). Electronically Signed   By: PMarijo SanesM.D.   On: 05/13/2021 14:04   CT ABDOMEN PELVIS W CONTRAST  Result Date: 05/02/2021 CLINICAL DATA:  Blockage unable to tolerate p.o. EXAM: CT ANGIOGRAPHY CHEST CT ABDOMEN AND PELVIS WITH CONTRAST TECHNIQUE: Multidetector CT imaging of the chest was performed using the standard protocol during bolus administration of intravenous contrast. Multiplanar CT image reconstructions and MIPs were obtained to evaluate the vascular anatomy. Multidetector CT  imaging of the abdomen and pelvis was performed using the standard protocol during bolus administration of intravenous contrast. CONTRAST:  152m OMNIPAQUE IOHEXOL 350 MG/ML SOLN COMPARISON:  Radiography 05/02/2021 FINDINGS: CTA CHEST FINDINGS Cardiovascular: Satisfactory opacification of the pulmonary arteries to the segmental level. No evidence of pulmonary embolism. Mild aortic atherosclerosis. Aneurysmal dilatation of the ascending aorta up to 4.8 cm.  Normal cardiac size. Mild coronary vascular calcification. No pericardial effusion Mediastinum/Nodes: No enlarged mediastinal, hilar, or axillary lymph nodes. Thyroid gland, trachea, and esophagus demonstrate no significant findings. Lungs/Pleura: Lungs are clear. No pleural effusion or pneumothorax. Musculoskeletal: No chest wall abnormality. No acute or significant osseous findings. Review of the MIP images confirms the above findings. CT ABDOMEN and PELVIS FINDINGS Hepatobiliary: Hyperdense sludge or excreted contrast within the gallbladder. No biliary dilatation. Subcentimeter hypodensities in the liver too small to further characterize. Small cyst in the left hepatic lobe. Pancreas: Unremarkable. No pancreatic ductal dilatation or surrounding inflammatory changes. Spleen: Normal in size without focal abnormality. Adrenals/Urinary Tract: Adrenal glands are normal. Bilateral renal cysts. 9.4 cm cyst in the upper pole left kidney. The bladder is unremarkable Stomach/Bowel: Fluid distension of the stomach, proximal and mid small bowel consistent with obstruction. Transition point related to right inguinal hernia containing fat and short segment of small bowel. Small bowel distal to the hernia is decompressed. No acute bowel wall thickening. Vascular/Lymphatic: Moderate aortic atherosclerosis. No aneurysm. No suspicious nodes Reproductive: Enlarged prostate Other: Moderate left inguinal hernia containing fat and decompressed small bowel. Musculoskeletal: Orthopedic hardware right hip with artifact. No acute osseous abnormality Review of the MIP images confirms the above findings. IMPRESSION: 1. Negative for acute pulmonary embolus.  Clear lung fields. 2. Findings consistent with mechanical small bowel obstruction, transition point related to right inguinal hernia. No perforation. 3. Moderate fat and small bowel containing left inguinal hernia without adverse features 4. Aneurysmal dilatation of the ascending aorta  up to 4.8 cm. Ascending thoracic aortic aneurysm. Recommend semi-annual imaging followup by CTA or MRA and referral to cardiothoracic surgery if not already obtained. This recommendation follows 2010 ACCF/AHA/AATS/ACR/ASA/SCA/SCAI/SIR/STS/SVM Guidelines for the Diagnosis and Management of Patients With Thoracic Aortic Disease. Circulation. 2010; 121:[id number] Aortic aneurysm NOS (ICD10-I71.9) Electronically Signed   By: KDonavan FoilM.D.   On: 05/02/2021 19:56   DG CHEST PORT 1 VIEW  Result Date: 05/11/2021 CLINICAL DATA:  Status post PICC line placement. EXAM: PORTABLE CHEST 1 VIEW COMPARISON:  May 03, 2021 FINDINGS: Sh right PICC line terminates in the central SVC. A prominent tortuous thoracic aorta is stable. The heart, hila, and mediastinum are otherwise unremarkable. No pneumothorax. The lungs are clear. IMPRESSION: 1. The right PICC line terminates in the central SVC. 2. Tortuous prominent thoracic aorta, unchanged. 3. The NG tube terminates in the stomach. 4. No other abnormalities. Electronically Signed   By: DDorise BullionIII M.D.   On: 05/11/2021 17:29   DG CHEST PORT 1 VIEW  Result Date: 05/03/2021 CLINICAL DATA:  NG tube placement EXAM: PORTABLE CHEST 1 VIEW COMPARISON:  05/02/2021 FINDINGS: NG tube tip is in the fundus of the stomach. No confluent airspace opacities. Heart is normal size. No effusions. IMPRESSION: NG tube tip in the fundus of the stomach. No acute cardiopulmonary disease. Electronically Signed   By: KRolm BaptiseM.D.   On: 05/03/2021 18:38   DG Chest Port 1 View  Result Date: 05/02/2021 CLINICAL DATA:  Shortness of breath EXAM: PORTABLE CHEST 1 VIEW COMPARISON:  08/04/2007 FINDINGS: Heart is normal size.  Tortuous, ectatic thoracic aorta. Lungs clear. No effusions. No acute bony abnormality. IMPRESSION: No acute cardiopulmonary disease. Electronically Signed   By: Rolm Baptise M.D.   On: 05/02/2021 18:25   DG Abd Portable 1V  Result Date: 05/12/2021 CLINICAL DATA:   Enteric tube placement. EXAM: PORTABLE ABDOMEN - 1 VIEW COMPARISON:  Abdominal x-ray dated May 07, 2021. FINDINGS: Unchanged enteric tube within the stomach. Mildly improved diffuse small bowel dilatation. Oral contrast again noted within the colon. IMPRESSION: 1. Unchanged enteric tube within the stomach. 2. Mildly improved ileus. Electronically Signed   By: Titus Dubin M.D.   On: 05/12/2021 08:53   DG Abd Portable 1V  Result Date: 05/07/2021 CLINICAL DATA:  Obstruction EXAM: PORTABLE ABDOMEN - 1 VIEW COMPARISON:  Portable exam 0525 hours compared to 05/06/2021 FINDINGS: Nasogastric tube projects over proximal stomach. Retained contrast in decompressed RIGHT colon. Numerous air-filled distended loops of small bowel throughout abdomen consistent with persistent small-bowel obstruction. Some gas is present in the distal sigmoid colon and rectum. No bowel wall thickening. Degenerative disc disease changes and dextroconvex scoliosis lumbar spine. Osseous demineralization and RIGHT hip prosthesis noted. IMPRESSION: Persistent small bowel obstruction Electronically Signed   By: Lavonia Dana M.D.   On: 05/07/2021 08:18   DG Abd Portable 1V  Result Date: 05/06/2021 CLINICAL DATA:  Small-bowel obstruction EXAM: PORTABLE ABDOMEN - 1 VIEW COMPARISON:  Portable exam 0844 hours compared to 05/05/2021 FINDINGS: Air-filled distended small bowel loops in the abdomen consistent with small bowel obstruction. Loops are in general little changed in diameter versus previous exam. Small amount retained contrast in RIGHT colon. No definite bowel wall thickening. Bones demineralized with degenerative changes and scoliosis of lumbar spine as well as note of a RIGHT hip prosthesis. IMPRESSION: Persistent small bowel obstruction. Electronically Signed   By: Lavonia Dana M.D.   On: 05/06/2021 10:26   DG Abd Portable 1V  Result Date: 05/05/2021 CLINICAL DATA:  87 year old male with right inguinal hernia related SBO. EXAM:  PORTABLE ABDOMEN - 1 VIEW COMPARISON:  CT Abdomen and Pelvis 05/02/2021.  KUB 05/03/2021. FINDINGS: Portable AP supine view at 0912 hours. Enteric tube placed, side hole at the level of the GE J. Decompressed stomach compared to 05/03/2021. But small-bowel obstruction gas pattern persists. Gas-filled small bowel loops continue to measure up to 40 mm in the central abdomen. Paucity of large bowel gas as before. Stable visualized osseous structures. IMPRESSION: 1. Enteric tube placed, side hole at the level of the GEJ. Advance 5 cm to ensure side hole placement within the stomach. 2. Continued small bowel obstruction gas pattern, not improved since 05/02/2021. Electronically Signed   By: Genevie Ann M.D.   On: 05/05/2021 10:00   DG Abd Portable 1 View  Result Date: 05/03/2021 CLINICAL DATA:  NG tube placement EXAM: PORTABLE ABDOMEN - 1 VIEW COMPARISON:  05/02/2021 FINDINGS: NG tube tip is in the distal esophagus. Dilated small bowel loops again seen in the abdomen. IMPRESSION: NG tube tip in the distal esophagus. Electronically Signed   By: Rolm Baptise M.D.   On: 05/03/2021 00:45   DG Abd Portable 1 View  Result Date: 05/02/2021 CLINICAL DATA:  Shortness of breath, abdominal pain EXAM: PORTABLE ABDOMEN - 1 VIEW COMPARISON:  None. FINDINGS: Nonspecific bowel gas pattern. Gas throughout nondistended large and small bowel. No evidence of bowel obstruction. No organomegaly, suspicious calcification or free air. Visualized lung bases clear. IMPRESSION: No evidence of bowel obstruction or free air. Electronically Signed   By: Lennette Bihari  [place] M.D.   On: 05/02/2021 18:26   ECHOCARDIOGRAM COMPLETE  Result Date: 05/04/2021    ECHOCARDIOGRAM REPORT   Patient Name:   FHER WYNDHAM Date of Exam: 05/04/2021 Medical Rec #:  [id number]          Height:       71.0 in Accession #:    [id number]         Weight:       170.0 lb Date of Birth:  [birth year]           BSA:          1.968 m Patient Age:    45 years           BP:            106/68 mmHg Patient Gender: M                  HR:           83 bpm. Exam Location:  Inpatient Procedure: 2D Echo, Cardiac Doppler, Color Doppler and 3D Echo Indications:    I48.91* Unspeicified atrial fibrillation  History:        Patient has prior history of Echocardiogram examinations, most                 recent 08/16/2017.  Sonographer:    Merrie Roof RDCS Referring Phys: [id number] [facility]  Sonographer Comments: Technically difficult study due to poor echo windows. IMPRESSIONS  1. Left ventricular ejection fraction, by estimation, is 60 to 65%. The left ventricle has normal function. The left ventricle has no regional wall motion abnormalities. There is mild left ventricular hypertrophy. Left ventricular diastolic parameters are indeterminate.  2. Right ventricular systolic function is normal. The right ventricular size is normal. Tricuspid regurgitation signal is inadequate for assessing PA pressure.  3. The mitral valve is degenerative. Trivial mitral valve regurgitation. No evidence of mitral stenosis.  4. The aortic valve is grossly normal. There is mild thickening of the aortic valve. Aortic valve regurgitation is trivial. No aortic stenosis is present.  5. The inferior vena cava is dilated in size with >50% respiratory variability, suggesting right atrial pressure of 8 mmHg. FINDINGS  Left Ventricle: Left ventricular ejection fraction, by estimation, is 60 to 65%. The left ventricle has normal function. The left ventricle has no regional wall motion abnormalities. 3D left ventricular ejection fraction analysis performed but not reported based on interpreter judgement due to suboptimal quality. The left ventricular internal cavity size was normal in size. There is mild left ventricular hypertrophy. Left ventricular diastolic parameters are indeterminate. Right Ventricle: The right ventricular size is normal. No increase in right ventricular wall thickness. Right ventricular systolic function is  normal. Tricuspid regurgitation signal is inadequate for assessing PA pressure. Left Atrium: Left atrial size was normal in size. Right Atrium: Right atrial size was normal in size. Pericardium: There is no evidence of pericardial effusion. Mitral Valve: The mitral valve is degenerative in appearance. Mild mitral annular calcification. Trivial mitral valve regurgitation. No evidence of mitral valve stenosis. Tricuspid Valve: The tricuspid valve is normal in structure. Tricuspid valve regurgitation is mild . No evidence of tricuspid stenosis. Aortic Valve: The aortic valve is grossly normal. There is mild thickening of the aortic valve. Aortic valve regurgitation is trivial. No aortic stenosis is present. Aortic valve mean gradient measures 4.0 mmHg. Aortic valve peak gradient measures 7.3 mmHg. Aortic valve area, by VTI measures 3.09 cm. Pulmonic Valve: The pulmonic valve was  normal in structure. Pulmonic valve regurgitation is not visualized. No evidence of pulmonic stenosis. Aorta: The aortic root is normal in size and structure. Venous: The inferior vena cava is dilated in size with greater than 50% respiratory variability, suggesting right atrial pressure of 8 mmHg. IAS/Shunts: The interatrial septum was not well visualized.  LEFT VENTRICLE PLAX 2D LVIDd:         3.90 cm LVIDs:         2.70 cm LV PW:         0.90 cm LV IVS:        1.10 cm LVOT diam:     2.00 cm  3D Volume EF: LV SV:         74       3D EF:        69 % LV SV Index:   37 LVOT Area:     3.14 cm  RIGHT VENTRICLE RV Basal diam:  3.20 cm LEFT ATRIUM             Index       RIGHT ATRIUM           Index LA diam:        3.30 cm 1.68 cm/m  RA Area:     17.20 cm LA Vol (A2C):   53.2 ml 27.03 ml/m RA Volume:   41.90 ml  21.29 ml/m LA Vol (A4C):   48.3 ml 24.54 ml/m LA Biplane Vol: 52.0 ml 26.43 ml/m  AORTIC VALVE AV Area (Vmax):    3.00 cm AV Area (Vmean):   3.03 cm AV Area (VTI):     3.09 cm AV Vmax:           135.00 cm/s AV Vmean:           92.900 cm/s AV VTI:            0.238 m AV Peak Grad:      7.3 mmHg AV Mean Grad:      4.0 mmHg LVOT Vmax:         129.00 cm/s LVOT Vmean:        89.700 cm/s LVOT VTI:          0.234 m LVOT/AV VTI ratio: 0.98  AORTA Ao Root diam: 3.30 cm Ao Asc diam:  3.63 cm  SHUNTS Systemic VTI:  0.23 m Systemic Diam: 2.00 cm Cherlynn Kaiser MD Electronically signed by Cherlynn Kaiser MD Signature Date/Time: 05/04/2021/1:17:47 PM    Final    [place] EKG SITE RITE  Result Date: 05/10/2021 If Site Rite image not attached, placement could not be confirmed due to current cardiac rhythm.   Labs:  CBC: Recent Labs    05/08/21 0621 05/10/21 0425 05/12/21 0329 05/13/21 0355  WBC 9.8 15.0* 22.2* 22.3*  HGB 12.7* 14.1 13.5 12.8*  HCT 38.9* 41.5 39.9 37.5*  PLT 206 260 317 298    COAGS: No results for input(s): INR, APTT in the last 8760 hours.  BMP: Recent Labs    05/10/21 0425 05/11/21 0032 05/12/21 0329 05/13/21 0355  NA 133* 134* 132* 132*  K 3.8 3.3* 3.7 3.7  CL 103 102 98 100  CO2 21* '25 25 23  '$ GLUCOSE 149* 166* 100* 124*  BUN '13 15 15 16  '$ CALCIUM 8.3* 8.3* 8.0* 8.2*  CREATININE 0.76 0.78 0.78 0.70  GFRNONAA >60 >60 >60 >60    LIVER FUNCTION TESTS: Recent Labs    05/02/21 1806 05/11/21 0032 05/12/21 0329  BILITOT  1.3* 0.4 0.6  AST '31 20 21  '$ ALT '20 21 19  '$ ALKPHOS 63 41 51  PROT 8.0 5.7* 5.5*  ALBUMIN 4.1 2.3* 2.0*    Assessment and Plan:  87 y.o. male. History of SVT, HLD, appendectomy with bilateral inguinal hernia. S/p right inguinal hernia repair on 8.5.22 and small bowel resection on 8.12.22. Now with no bowel function and a dissented abdomen. CT abd from 8.16.22 reads Large rim enhancing fluid collection in the pelvis measuring approximately 12 x 11 x 10 cm consistent with a pelvic abscess. Team is requesting a pelvic abscess.    WBC is 22.3. No blood cultures. Patient is on lovenox prophylactic dose. All other labs and medications are within acceptable parameters. Patient is  afebrile. NKDA. Patient is on TPN.   Risks and benefits discussed with the patient including bleeding, infection, damage to adjacent structures, bowel perforation/fistula connection, and sepsis.  All of the patient's questions were answered, patient is agreeable to proceed. Consent signed and in chart.   Thank you for this interesting consult.  I greatly enjoyed meeting Cyprian Naasz [facility] and look forward to participating in their care.  A copy of this report was sent to the requesting provider on this date.  Electronically Signed: Jacqualine Mau, NP 05/13/2021, 3:23 PM   I spent a total of 40 Minutes    in face to face in clinical consultation, greater than 50% of which was counseling/coordinating care for pelvic abscess drain

## 2021-05-13 NOTE — Plan of Care (Signed)
  Problem: Nutrition: Goal: Adequate nutrition will be maintained Outcome: Progressing   Problem: Coping: Goal: Level of anxiety will decrease Outcome: Progressing   Problem: Elimination: Goal: Will not experience complications related to urinary retention Outcome: Progressing   Problem: Pain Managment: Goal: General experience of comfort will improve Outcome: Progressing   

## 2021-05-13 NOTE — Progress Notes (Signed)
CT called to update on pt results, MD notified.   Chrisandra Carota, RN 05/13/2021 2:18 PM

## 2021-05-13 NOTE — Progress Notes (Signed)
Pt arrived back from CT, VSS, call light within reach, will continue to monitor.   Chrisandra Carota, RN 05/13/2021 12:02 PM

## 2021-05-13 NOTE — Progress Notes (Signed)
CT scan noted.  Findings discussed with patient.  Have initiated zosyn and consulted IR for perc drain of large pelvic abscess.  Patient understands and agrees with this plan.  Gary Rice 2:41 PM 05/13/2021

## 2021-05-13 NOTE — Progress Notes (Signed)
[place] for Resuming Anticoagulants Post-IR Procedure (Enoxaparin) Indication:  VTE Prophylaxis  No Known Allergies  Patient Measurements: Height: '5\' 11"'$  (180.3 cm) Weight: 77.1 kg (170 lb) IBW/kg (Calculated) : 75.3  Vital Signs: Temp: 98.5 F (36.9 C) (08/16 1520) Temp Source: Oral (08/16 1520) BP: 108/67 (08/16 1650) Pulse Rate: 85 (08/16 1650)  Labs: Recent Labs    05/11/21 0032 05/12/21 0329 05/13/21 0355  HGB  --  13.5 12.8*  HCT  --  39.9 37.5*  PLT  --  317 298  CREATININE 0.78 0.78 0.70    Estimated Creatinine Clearance: 68 mL/min (by C-G formula based on SCr of 0.7 mg/dL).   Medical History: History reviewed. No pertinent past medical history.  Assessment: 87 yr old man S/P CT-guided drain placement for pelvic fluid collection by IR this afternoon (procedure completed ~1700 PM). Pharmacy is consulted to resume anticoagulants post IR procedure, per [facility] protocol (procedure is standard bleeding risk, per consult information). Pt was receiving enoxaparin 40 mg SQ daily for VTE prophylaxis prior to procedure (last dose was at 1211 PM today).   H/H 12.8/37.5, plt 298  Goal of Therapy:  Prevention of VTE Monitor platelets by anticoagulation protocol: Yes   Plan:  Resume enoxaparin 40 mg SQ daily with tomorrow's dose (scheduled for 12 noon) Monitor CBC Monitor for bleeding  Gillermina Hu, PharmD, BCPS, [facility] Clinical Pharmacist 05/13/2021,5:07 PM

## 2021-05-13 NOTE — Progress Notes (Signed)
PT Cancellation Note  Patient Details Name: Alben Kosier MRN: [id number] DOB: August 13, 1931   Cancelled Treatment:    Reason Eval/Treat Not Completed: (P) Patient at procedure or test/unavailable (Pt at IR for drain placement.) Will continue efforts per PT POC next date as schedule permits.   Kara Pacer Chasen Mendell 05/13/2021, 4:18 PM

## 2021-05-14 DIAGNOSIS — K651 Peritoneal abscess: Secondary | ICD-10-CM

## 2021-05-14 DIAGNOSIS — E785 Hyperlipidemia, unspecified: Secondary | ICD-10-CM

## 2021-05-14 LAB — BASIC METABOLIC PANEL
Anion gap: 5 (ref 5–15)
BUN: 15 mg/dL (ref 8–23)
CO2: 25 mmol/L (ref 22–32)
Calcium: 8.1 mg/dL — ABNORMAL LOW (ref 8.9–10.3)
Chloride: 102 mmol/L (ref 98–111)
Creatinine, Ser: 0.63 mg/dL (ref 0.61–1.24)
GFR, Estimated: >60 mL/min (ref >60)
Glucose, Bld: 143 mg/dL — ABNORMAL HIGH (ref 70–99)
Potassium: 3.8 mmol/L (ref 3.5–5.1)
Sodium: 132 mmol/L — ABNORMAL LOW (ref 135–145)

## 2021-05-14 LAB — GLUCOSE, CAPILLARY
Glucose-Capillary: 157 mg/dL — ABNORMAL HIGH (ref 70–99)
Glucose-Capillary: 139 mg/dL — ABNORMAL HIGH (ref 70–99)
Glucose-Capillary: 142 mg/dL — ABNORMAL HIGH (ref 70–99)
Glucose-Capillary: 148 mg/dL — ABNORMAL HIGH (ref 70–99)
Glucose-Capillary: 143 mg/dL — ABNORMAL HIGH (ref 70–99)

## 2021-05-14 LAB — CBC
HCT: 34.6 % — ABNORMAL LOW (ref 39.0–52.0)
Hemoglobin: 11.9 g/dL — ABNORMAL LOW (ref 13.0–17.0)
MCH: 31.8 pg (ref 26.0–34.0)
MCHC: 34.4 g/dL (ref 30.0–36.0)
MCV: 92.5 fL (ref 80.0–100.0)
Platelets: 263 10*3/uL (ref 150–400)
RBC: 3.74 MIL/uL — ABNORMAL LOW (ref 4.22–5.81)
RDW: 13.1 % (ref 11.5–15.5)
WBC: 20.3 10*3/uL — ABNORMAL HIGH (ref 4.0–10.5)
nRBC: 0 % (ref 0.0–0.2)

## 2021-05-14 LAB — PHOSPHORUS: Phosphorus: 3.2 mg/dL (ref 2.5–4.6)

## 2021-05-14 LAB — MAGNESIUM: Magnesium: 1.9 mg/dL (ref 1.7–2.4)

## 2021-05-14 MED ORDER — TRAVASOL 10 % IV SOLN
INTRAVENOUS | Status: AC
  Filled 2021-05-14: qty 924

## 2021-05-14 NOTE — Progress Notes (Signed)
PHARMACY - TOTAL PARENTERAL NUTRITION CONSULT NOTE   Indication: Prolonged ileus, SBO  Patient Measurements: Height: '5\' 11"'$  (180.3 cm) Weight: 77.1 kg (170 lb) IBW/kg (Calculated) : 75.3 TPN AdjBW (KG): 77.1 Body mass index is 23.71 kg/m. Usual Weight:    Assessment: Sent by OP MD 8/5 for r/o SBO. New onset afib (CHA2DS2-VASc 2) . CT showing findings consistent with mechanical SBO with transition point related to right inguinal hernia; no perforation. Has been unable to tolerate orals. Start TPN 8/14.   PMH: SVT, hyperlipidemia, appendectomy, bilateral inguinal hernias, previous hernia repair  Glucose / Insulin: CBGs 130-150s with TPN increase to goal rate, 8 units insulin given with new TPN Electrolytes: Na 132, K 3.8, Phos 3.2, Mg 1.9 (goal K+>=4, Mg >=2), CoCa wnl Renal: Scr <1 Hepatic: LFT's WNL. Albumin 2.0. TG 86 Intake / Output; MIVF: UOP 0.8 ml/kg/hr up, NG 250>661m, Drain 4577mout, LBM 8/10  LR '@KVO'$   GI Meds: IV PPI/24h  GI Imaging: - 8/5: CT showing findings consistent with mechanical SBO with transition point related to right inguinal hernia; no perforation.  Moderate fat and small bowel containing left inguinal hernia without adverse features.  - 8/8: 8/8: Persistent small bowel obstruction, with transition point in the left lower quadrant anteriorly, possibly due to adhesions.  GI Surgeries / Procedures:  8/6:  incarcerated RIGHT inguinal hernia repair with mesh 8/12:  diagnostic lap due to ongoing LLQ ischemia and microperforation requiring resection. Open small bowel resection with primary anastomosis 8/16: IR drain pelvic abscess   Central access: PICC 8/14 TPN start date: 8/14  Nutritional Goals (per RD recommendation on 8/15): kCal: 1850-2050, Protein: 85-100g, Fluid:  >1.85L Goal TPN rate is  70 mL/hr (provides 92 g of protein and  1851 kcals per day)  Current Nutrition:  NPO TPN  Plan:  Continue TPN at 70 mL/hr at 1800  Electrolytes in TPN:  Increase Na 7025mL, Increase K 52m1m, Ca 5mEq52m Inc Mg 10mEq65mand Phos 8mmol/62mCl:Ac 1:1 Add standard MVI and trace elements to TPN Continue Sensitive q4h SSI and adjust as needed  TPN labs in AM  JonathaBertis RuddyD Clinical Pharmacist Please check AMION for all [facility] 05/14/2021 7:34 AM

## 2021-05-14 NOTE — Progress Notes (Signed)
Occupational Therapy Treatment Patient Details Name: Gary Rice MRN: [id number] DOB: Feb 18, 1931 Today's Date: 05/14/2021    History of present illness 87 yo male admitted 8/522 after being referred to ED by PCP secondary to incarcerated inguinal hernia causing SBO, also found to be in Afib with RVR in the ED. Received surgical repair of incarcerated inguinal hernia 05/02/21. NGT placed to wall suction 8/6. PMH hernia repair joint replacement. Drain placement yesterday 05/13/21   OT comments  Pt making great progress with OT goals at this time. Pt requiring min guard-min A for safety with all mobility and ADL's. At this time, pt is most limited by decreased activity tolerance. However he reports that he was been walking in the hall multiple times a day the past few days. OT will continue to follow acutely.    Follow Up Recommendations  Home health OT;Supervision - Intermittent    Equipment Recommendations  Other (comment)    Recommendations for Other Services      Precautions / Restrictions Precautions Precautions: Fall Precaution Comments: NGT not to suction Restrictions Weight Bearing Restrictions: No       Mobility Bed Mobility Overal bed mobility: Needs Assistance Bed Mobility: Supine to Sit;Sit to Supine     Supine to sit: Supervision;HOB elevated Sit to supine: Supervision;HOB elevated   General bed mobility comments: Sup for increased time, use of rails, and safety    Transfers Overall transfer level: Needs assistance Equipment used: Rolling walker (2 wheeled) Transfers: Sit to/from Stand Sit to Stand: Supervision         General transfer comment: Assist for safety and lines. Pt uses momentum to assist with rise to stand.    Balance Overall balance assessment: Mild deficits observed, not formally tested                                         ADL either performed or assessed with clinical judgement   ADL Overall ADL's : Needs  assistance/impaired     Grooming: Wash/dry hands;Wash/dry face;Min guard;Standing Grooming Details (indicate cue type and reason): completed at sink     Lower Body Bathing: Minimal assistance;Sitting/lateral leans;Sit to/from stand Lower Body Bathing Details (indicate cue type and reason): Min A for throroughness due to safety and pt fatigue after ambulating in hallway         Toilet Transfer: Min guard;Ambulation Toilet Transfer Details (indicate cue type and reason): Pt ambulated into bathroom and completed toileting on low setting toilet Toileting- Clothing Manipulation and Hygiene: Minimal assistance;Sitting/lateral lean;Sit to/from stand Toileting - Clothing Manipulation Details (indicate cue type and reason): Min A for thoroughness     Functional mobility during ADLs: Min guard;Rolling walker General ADL Comments: Pt wanted to complete mobility in the hallway, then needed to complete toileting once back in the room, at overall min guard-min A level     Vision   Vision Assessment?: No apparent visual deficits   Perception     Praxis      Cognition Arousal/Alertness: Awake/alert Behavior During Therapy: WFL for tasks assessed/performed Overall Cognitive Status: Within Functional Limits for tasks assessed                                          Exercises     Shoulder Instructions  General Comments VSS on RA    Pertinent Vitals/ Pain       Pain Assessment: No/denies pain  Home Living                                          Prior Functioning/Environment              Frequency  Min 2X/week        Progress Toward Goals  OT Goals(current goals can now be found in the care plan section)  Progress towards OT goals: Progressing toward goals  Acute Rehab OT Goals Patient Stated Goal: Get NG tube out, get stronger OT Goal Formulation: With patient Time For Goal Achievement: 05/18/21 Potential to Achieve  Goals: Good ADL Goals Pt Will Perform Grooming: with modified independence;standing Pt Will Transfer to Toilet: with modified independence;ambulating Pt Will Perform Toileting - Clothing Manipulation and hygiene: with modified independence;sitting/lateral leans;sit to/from stand Pt/caregiver will Perform Home Exercise Program: Increased strength;Both right and left upper extremity;Independently  Plan Discharge plan remains appropriate;Frequency remains appropriate    Co-evaluation                 AM-PAC OT "6 Clicks" Daily Activity     Outcome Measure   Help from another person eating meals?: None Help from another person taking care of personal grooming?: A Little Help from another person toileting, which includes using toliet, bedpan, or urinal?: A Little Help from another person bathing (including washing, rinsing, drying)?: A Little Help from another person to put on and taking off regular upper body clothing?: A Little Help from another person to put on and taking off regular lower body clothing?: A Little 6 Click Score: 19    End of Session Equipment Utilized During Treatment: Rolling walker;Gait belt  OT Visit Diagnosis: Unsteadiness on feet (R26.81);Other abnormalities of gait and mobility (R26.89);Muscle weakness (generalized) (M62.81)   Activity Tolerance Patient tolerated treatment well   Patient Left in bed;with call bell/phone within reach   Nurse Communication Mobility status        Time: [id number] OT Time Calculation (min): 42 min  Charges: OT General Charges $OT Visit: 1 Visit OT Treatments $Self Care/Home Management : 8-22 mins $Therapeutic Activity: 23-37 mins  Marty Uy H., OTR/L Acute Rehabilitation  Kalon Erhardt Elane Yolanda Bonine 05/14/2021, 6:34 PM

## 2021-05-14 NOTE — Progress Notes (Signed)
   05/14/21 0514  Mobility  Head of Bed Elevated  HOB 30  Activity Ambulated in hall  Range of Motion/Exercises Active;All extremities  Level of Assistance Standby assist, set-up cues, supervision of patient - no hands on  Assistive Device Front wheel walker  Minutes Stood 15 minutes  Minutes Ambulated 15 minutes  Distance Ambulated (ft) 120 ft  Mobility Response Tolerated poorly  Mobility performed by Nurse  Bed Position Chair  Transport method Ambulatory  Mobility Ambulated with assistance in hallway    Pt ambulated in hallway with walker and a staff nurse around 120 feet. HR 135 and complained SOB. She walked back to her room and rest on the chair. Then HR 95, BP 175/60 mmHg, SPO2 97%. We will monitor.    Kennyth Lose, RN

## 2021-05-14 NOTE — Progress Notes (Signed)
Pt is alert and fully oriented, no chills, afebrile, hemodynamically stable. NSR on the monitor with frequent PAC, HR 70s. On room air , SPO2 93-94%. Mid abdominal dressing moist to dray changed. He has moderate serosanguinous drainage with malodorous smell on JP drain total day shift 390 ml, night 60 ml, total 450 ml since JP drain placement. No acute distress noted. We will continue to monitor.  Kennyth Lose, RN

## 2021-05-14 NOTE — Progress Notes (Signed)
5 Days Post-Op  Subjective: Passing lots of flatus today.  No BM yet, but feeling better today.  NGT output down.  Got IR drain for pelvic abscess yesterday  Objective: Vital signs in last 24 hours: Temp:  [97.9 F (36.6 C)-98.9 F (37.2 C)] 98.3 F (36.8 C) (08/17 0730) Pulse Rate:  [74-92] 76 (08/17 0730) Resp:  [12-21] 15 (08/17 0730) BP: (108-132)/(64-84) 121/64 (08/17 0730) SpO2:  [91 %-98 %] 92 % (08/17 0730) Last BM Date: 05/07/21  Intake/Output from previous day: 08/16 0701 - 08/17 0700 In: 1787.8 [I.V.:1385.2; IV Piggyback:392.6] Out: 2545 [Urine:1495; Emesis/NG output:600; Drains:450] Intake/Output this shift: No intake/output data recorded.  PE: Abd: less distended today.  Midline wound is packed and cleaning up.  RLQ incision is c/d/I.  +BS, NGT with minimal output today.  Perc drain is TG and with slightly cloudy serosang output.  450cc total since placement recorded.  Lab Results:  Recent Labs    05/13/21 0355 05/14/21 0400  WBC 22.3* 20.3*  HGB 12.8* 11.9*  HCT 37.5* 34.6*  PLT 298 263   BMET Recent Labs    05/13/21 0355 05/14/21 0400  NA 132* 132*  K 3.7 3.8  CL 100 102  CO2 23 25  GLUCOSE 124* 143*  BUN 16 15  CREATININE 0.70 0.63  CALCIUM 8.2* 8.1*   PT/INR No results for input(s): LABPROT, INR in the last 72 hours. CMP     Component Value Date/Time   NA 132 (L) 05/14/2021 0400   K 3.8 05/14/2021 0400   CL 102 05/14/2021 0400   CO2 25 05/14/2021 0400   GLUCOSE 143 (H) 05/14/2021 0400   BUN 15 05/14/2021 0400   CREATININE 0.63 05/14/2021 0400   CALCIUM 8.1 (L) 05/14/2021 0400   PROT 5.5 (L) 05/12/2021 0329   ALBUMIN 2.0 (L) 05/12/2021 0329   AST 21 05/12/2021 0329   ALT 19 05/12/2021 0329   ALKPHOS 51 05/12/2021 0329   BILITOT 0.6 05/12/2021 0329   GFRNONAA >60 05/14/2021 0400   GFRAA  08/14/2007 0312    >60        The eGFR has been calculated using the MDRD equation. This calculation has not been validated in all  clinical   Lipase     Component Value Date/Time   LIPASE 27 05/02/2021 1806       Studies/Results: CT ABDOMEN PELVIS W CONTRAST  Result Date: 05/13/2021 CLINICAL DATA:  History of right inguinal hernia repair and small-bowel resection. Leukocytosis. EXAM: CT ABDOMEN AND PELVIS WITH CONTRAST TECHNIQUE: Multidetector CT imaging of the abdomen and pelvis was performed using the standard protocol following bolus administration of intravenous contrast. CONTRAST:  33m OMNIPAQUE IOHEXOL 350 MG/ML SOLN COMPARISON:  CT scan 05/05/2021 FINDINGS: Lower chest: Small bilateral pleural effusions, right larger than left with overlying atelectasis. No definite infiltrates or pneumothorax. Stable 4 mm left lower lobe pulmonary nodule. The heart is normal in size. No pericardial effusion. There is an NG tube coursing down the esophagus and into the stomach. Stable aortic and coronary artery calcifications. Hepatobiliary: A few small scattered hepatic cysts are again noted. No worrisome hepatic lesions or intrahepatic biliary dilatation. Gallbladder demonstrates layering gallstones but no findings for acute cholecystitis. No common bile duct dilatation. Pancreas: No mass, inflammation or ductal dilatation. Spleen: Normal size.  No focal lesions. Adrenals/Urinary Tract: The adrenal glands unremarkable stable. Stable renal cysts. No worrisome renal lesions or hydronephrosis. There is gas in the bladder are likely from recent catheterization. Stomach/Bowel: The  stomach is unremarkable. It contains an NG tube. The duodenum is unremarkable. Slightly distended small bowel loops but no obstructive findings. Contrast gets all the way to the colon. Postoperative changes in the left lower quadrant with anastomosis sutures. There is surrounding small enhancing fluid collections and significant inflammatory type changes. There is also a small amount of free air and. There is a slightly larger interloop rim enhancing fluid  collection measuring 3 cm on image number 58/5 which contains a small amount of gas. This is likely a small abscess. There is a large rim enhancing fluid collection in the pelvis measuring approximately 12 x 11 x 10 cm and is located mainly between the bladder and the rectum. I do not see any gas in this fluid collection but it is consistent with a pelvic abscess. Vascular/Lymphatic: Stable vascular calcifications. Scattered mesenteric and retroperitoneal lymph nodes but no mass or overt adenopathy the. Reproductive: The prostate gland and seminal vesicles are grossly normal. Other: Surgical changes from recent right inguinal hernia repair with small amount fluid and gas. The large left inguinal hernia is stable. It contains small bowel loops but no findings for incarceration or obstruction. Musculoskeletal: Stable scoliosis and advanced degenerative lumbar spondylosis. No acute bony findings. IMPRESSION: 1. Postoperative changes in the left lower quadrant from recent small bowel resection with anastomosis sutures. Small surrounding rim enhancing fluid collections and marked inflammatory changes consistent with small abscesses. No leaking oral contrast is demonstrated. 2. Large rim enhancing fluid collection in the pelvis measuring approximately 12 x 11 x 10 cm consistent with a pelvic abscess. 3. Small bilateral pleural effusions, right larger than left with overlying atelectasis. 4. Cholelithiasis. 5. Stable large left inguinal hernia containing small bowel loops but no findings for incarceration or obstruction. 6. Stable 4 mm left lower lobe pulmonary nodule. These results will be called to the ordering clinician or representative by the Radiologist Assistant, and communication documented in the PACS or [employer]. Aortic Atherosclerosis (ICD10-I70.0). Electronically Signed   By: Marijo Sanes M.D.   On: 05/13/2021 14:04    Anti-infectives: Anti-infectives (From admission, onward)    Start      Dose/Rate Route Frequency Ordered Stop   05/13/21 1515  piperacillin-tazobactam (ZOSYN) IVPB 3.375 g        3.375 g 12.5 mL/hr over 240 Minutes Intravenous Every 8 hours 05/13/21 1421     05/09/21 0915  ceFAZolin (ANCEF) IVPB 2g/100 mL premix        2 g 200 mL/hr over 30 Minutes Intravenous On call to O.R. 05/09/21 0825 05/09/21 1050        Assessment/Plan POD 12, s/p open right inguinal hernia repair with mesh by Dr. Bobbye Morton for incarcerated RIH with persistent ileus vs SBO. POD 5, s/p dx lap with mini laparotomy with SBR, Dr. Donne Hazel 05/09/21 -clamp NGT and allow sips of clears from the floor.  If tolerates, can hopefully Dc NGT tomorrow -cont BID WD dressing changes to midline wound -WBC down to 20K.  Cx gram stain with gram - rods, gram variable rods.  Awaiting final culture.  Cont zosyn -IS/pulm toilet -mobilize TID in halls -cont TNA until tolerating some orals   FEN - NPO/NGT clamped todayTNA VTE - Lovenox ID - none  New onset a fib Ascending thoracic aneurysm   LOS: 12 days    Henreitta Cea , MD [facility] Surgery 05/14/2021, 9:23 AM Please see Amion for pager number during day hours 7:00am-4:30pm or 7:00am -11:30am on weekends

## 2021-05-14 NOTE — Progress Notes (Signed)
Referring Physician(s): Saverio Danker PA-C   Supervising Physician: Arne Cleveland  Patient Status:  [facility] - In-pt  Chief Complaint:  S/p pelvic abscess drain placement with Dr. Dwaine Gale on 05/14/21   Subjective:  Pt sitting in a recliner, not in acute distress.  States that feels mush better after the drain placement.  Denies fever, chills, N/V.   Allergies: Patient has no known allergies.  Medications: Prior to Admission medications   Medication Sig Start Date End Date Taking? Authorizing Provider  aspirin EC 81 MG tablet Take 81 mg by mouth at bedtime.   Yes [provider]  atorvastatin (LIPITOR) 10 MG tablet Take 10 mg by mouth at bedtime. 08/14/15  Yes [provider]  diltiazem (CARDIZEM) 30 MG tablet TAKE 1 TABLET AS NEEDED FOR RAPID HEART BEAT Patient taking differently: Take 30 mg by mouth daily as needed (rapid heart beat). 10/06/17  Yes Isaiah Serge, NP  fluticasone (FLONASE) 50 MCG/ACT nasal spray Place 1 spray into both nostrils daily. 08/14/15  Yes [provider]  Multiple Vitamin (MULTIVITAMIN WITH MINERALS) TABS tablet Take 1 tablet by mouth daily.   Yes [provider]  Multiple Vitamins-Minerals (PRESERVISION AREDS 2) CAPS Take 1 capsule 2 (two) times daily by mouth.   Yes [provider]  VITAMIN E PO Take 1 capsule by mouth daily.    Yes [provider]     Vital Signs: BP 121/64   Pulse 76   Temp 98.3 F (36.8 C) (Oral)   Resp 15   Ht '5\' 11"'$  (1.803 m)   Wt 170 lb (77.1 kg)   SpO2 92%   BMI 23.71 kg/m   Physical Exam Vitals reviewed.  Constitutional:      General: He is not in acute distress.    Appearance: He is not ill-appearing.  HENT:     Head: Normocephalic and atraumatic.  Pulmonary:     Effort: Pulmonary effort is normal.  Abdominal:     General: Abdomen is flat.     Palpations: Abdomen is soft.  Musculoskeletal:     Cervical back: Neck supple.  Skin:    General: Skin is warm  and dry.     Coloration: Skin is not jaundiced or pale.     Comments: Positive R TG drain to a suction bulb. Site not check as patient sitting ans was not able to roll to side. 20 ml of  blood colored fluid noted in the bulb. Drain works well per [employer].   Neurological:     Mental Status: He is alert and oriented to person, place, and time.  Psychiatric:        Mood and Affect: Mood normal.        Behavior: Behavior normal.    Imaging: CT ABDOMEN PELVIS W CONTRAST  Result Date: 05/13/2021 CLINICAL DATA:  History of right inguinal hernia repair and small-bowel resection. Leukocytosis. EXAM: CT ABDOMEN AND PELVIS WITH CONTRAST TECHNIQUE: Multidetector CT imaging of the abdomen and pelvis was performed using the standard protocol following bolus administration of intravenous contrast. CONTRAST:  66m OMNIPAQUE IOHEXOL 350 MG/ML SOLN COMPARISON:  CT scan 05/05/2021 FINDINGS: Lower chest: Small bilateral pleural effusions, right larger than left with overlying atelectasis. No definite infiltrates or pneumothorax. Stable 4 mm left lower lobe pulmonary nodule. The heart is normal in size. No pericardial effusion. There is an NG tube coursing down the esophagus and into the stomach. Stable aortic and coronary artery calcifications. Hepatobiliary: A few small scattered  hepatic cysts are again noted. No worrisome hepatic lesions or intrahepatic biliary dilatation. Gallbladder demonstrates layering gallstones but no findings for acute cholecystitis. No common bile duct dilatation. Pancreas: No mass, inflammation or ductal dilatation. Spleen: Normal size.  No focal lesions. Adrenals/Urinary Tract: The adrenal glands unremarkable stable. Stable renal cysts. No worrisome renal lesions or hydronephrosis. There is gas in the bladder are likely from recent catheterization. Stomach/Bowel: The stomach is unremarkable. It contains an NG tube. The duodenum is unremarkable. Slightly distended small bowel loops but no obstructive  findings. Contrast gets all the way to the colon. Postoperative changes in the left lower quadrant with anastomosis sutures. There is surrounding small enhancing fluid collections and significant inflammatory type changes. There is also a small amount of free air and. There is a slightly larger interloop rim enhancing fluid collection measuring 3 cm on image number 58/5 which contains a small amount of gas. This is likely a small abscess. There is a large rim enhancing fluid collection in the pelvis measuring approximately 12 x 11 x 10 cm and is located mainly between the bladder and the rectum. I do not see any gas in this fluid collection but it is consistent with a pelvic abscess. Vascular/Lymphatic: Stable vascular calcifications. Scattered mesenteric and retroperitoneal lymph nodes but no mass or overt adenopathy the. Reproductive: The prostate gland and seminal vesicles are grossly normal. Other: Surgical changes from recent right inguinal hernia repair with small amount fluid and gas. The large left inguinal hernia is stable. It contains small bowel loops but no findings for incarceration or obstruction. Musculoskeletal: Stable scoliosis and advanced degenerative lumbar spondylosis. No acute bony findings. IMPRESSION: 1. Postoperative changes in the left lower quadrant from recent small bowel resection with anastomosis sutures. Small surrounding rim enhancing fluid collections and marked inflammatory changes consistent with small abscesses. No leaking oral contrast is demonstrated. 2. Large rim enhancing fluid collection in the pelvis measuring approximately 12 x 11 x 10 cm consistent with a pelvic abscess. 3. Small bilateral pleural effusions, right larger than left with overlying atelectasis. 4. Cholelithiasis. 5. Stable large left inguinal hernia containing small bowel loops but no findings for incarceration or obstruction. 6. Stable 4 mm left lower lobe pulmonary nodule. These results will be called to  the ordering clinician or representative by the Radiologist Assistant, and communication documented in the PACS or [employer]. Aortic Atherosclerosis (ICD10-I70.0). Electronically Signed   By: Marijo Sanes M.D.   On: 05/13/2021 14:04   DG CHEST PORT 1 VIEW  Result Date: 05/11/2021 CLINICAL DATA:  Status post PICC line placement. EXAM: PORTABLE CHEST 1 VIEW COMPARISON:  May 03, 2021 FINDINGS: Sh right PICC line terminates in the central SVC. A prominent tortuous thoracic aorta is stable. The heart, hila, and mediastinum are otherwise unremarkable. No pneumothorax. The lungs are clear. IMPRESSION: 1. The right PICC line terminates in the central SVC. 2. Tortuous prominent thoracic aorta, unchanged. 3. The NG tube terminates in the stomach. 4. No other abnormalities. Electronically Signed   By: Dorise Bullion III M.D.   On: 05/11/2021 17:29   DG Abd Portable 1V  Result Date: 05/12/2021 CLINICAL DATA:  Enteric tube placement. EXAM: PORTABLE ABDOMEN - 1 VIEW COMPARISON:  Abdominal x-ray dated May 07, 2021. FINDINGS: Unchanged enteric tube within the stomach. Mildly improved diffuse small bowel dilatation. Oral contrast again noted within the colon. IMPRESSION: 1. Unchanged enteric tube within the stomach. 2. Mildly improved ileus. Electronically Signed   By: Orville Govern.D.  On: 05/12/2021 08:53   [place] EKG SITE RITE  Result Date: 05/10/2021 If Site Rite image not attached, placement could not be confirmed due to current cardiac rhythm.   Labs:  CBC: Recent Labs    05/10/21 0425 05/12/21 0329 05/13/21 0355 05/14/21 0400  WBC 15.0* 22.2* 22.3* 20.3*  HGB 14.1 13.5 12.8* 11.9*  HCT 41.5 39.9 37.5* 34.6*  PLT 260 317 298 263    COAGS: No results for input(s): INR, APTT in the last 8760 hours.  BMP: Recent Labs    05/11/21 0032 05/12/21 0329 05/13/21 0355 05/14/21 0400  NA 134* 132* 132* 132*  K 3.3* 3.7 3.7 3.8  CL 102 98 100 102  CO2 '25 25 23 25  '$ GLUCOSE 166* 100*  124* 143*  BUN '15 15 16 15  '$ CALCIUM 8.3* 8.0* 8.2* 8.1*  CREATININE 0.78 0.78 0.70 0.63  GFRNONAA >60 >60 >60 >60    LIVER FUNCTION TESTS: Recent Labs    05/02/21 1806 05/11/21 0032 05/12/21 0329  BILITOT 1.3* 0.4 0.6  AST '31 20 21  '$ ALT '20 21 19  '$ ALKPHOS 63 41 51  PROT 8.0 5.7* 5.5*  ALBUMIN 4.1 2.3* 2.0*    Assessment and Plan:  87 y.o. male s/p right inguinal hernia repair on 05/02/21 and small bowel resection on 8.12.22. CT abd from 05/13/21 showed  large rim enhancing fluid collection in the pelvis consistent with a pelvic abscess. S/p pelvic drain placement with Dr. Dwaine Gale on 05/13/21.   Pt stable, drain works well per [employer].  Drain site was not assessed as patient  sitting on a recliner and was not able to roll to side.  OP 450 cc  VSS WBC 20.3 today (22.3 yesterday) Cx pending   Continue with flushing TID, output recording q shift and dressing changes as needed. Would consider additional imaging when output is less than 10 ml for 24 hours not including flush material.     Further treatment plan per [facility]S/ Appreciate and agree with the plan.  IR to follow.    Electronically Signed: Tera Mater, PA-C 05/14/2021, 8:33 AM   I spent a total of 25 Minutes at the the patient's bedside AND on the patient's hospital floor or unit, greater than 50% of which was counseling/coordinating care for pelvic abscess drain

## 2021-05-14 NOTE — Progress Notes (Signed)
Physical Therapy Treatment Patient Details Name: Gary Rice MRN: [id number] DOB: 07-06-1931 Today's Date: 05/14/2021    History of Present Illness 87 yo male admitted 8/522 after being referred to ED by PCP secondary to incarcerated inguinal hernia causing SBO, also found to be in Afib with RVR in the ED. Received surgical repair of incarcerated inguinal hernia 05/02/21. NGT placed to wall suction 8/6. PMH hernia repair joint replacement. Drain placement yesterday 05/13/21    PT Comments    Patient received in bed, he is agreeable to PT. Requires min assist for supine to sit. Min guard for sit to stand. He requires increased time to get prepared to walk this session. (Using urinal, unhooking NG from wall). Patient is able to ambulate 175 feet with RW and min guard. Several brief standing rest breaks due to fatigue. Good balance and safety awareness. Patient will continue to benefit from skilled PT while here to improve activity tolerance, strength and functional independence.      Follow Up Recommendations  Home health PT     Equipment Recommendations  Rolling walker with 5" wheels    Recommendations for Other Services       Precautions / Restrictions Precautions Precautions: Fall Precaution Comments: NGT to wall suction Restrictions Weight Bearing Restrictions: No    Mobility  Bed Mobility Overal bed mobility: Needs Assistance Bed Mobility: Supine to Sit     Supine to sit: Min assist;HOB elevated     General bed mobility comments: min assist to raise trunk to seated position    Transfers Overall transfer level: Needs assistance Equipment used: Rolling walker (2 wheeled) Transfers: Sit to/from Stand Sit to Stand: Supervision            Ambulation/Gait Ambulation/Gait assistance: Min guard Gait Distance (Feet): 175 Feet Assistive device: Rolling walker (2 wheeled) Gait Pattern/deviations: Step-through pattern;Decreased step length - right;Decreased step  length - left;Decreased stride length Gait velocity: decr   General Gait Details: patient with slow, steady cadence. No lob. Several standing rest breaks due to fatigue.   Stairs             Wheelchair Mobility    Modified Rankin (Stroke Patients Only)       Balance Overall balance assessment: Needs assistance Sitting-balance support: Feet supported Sitting balance-Leahy Scale: Normal     Standing balance support: Bilateral upper extremity supported;During functional activity Standing balance-Leahy Scale: Fair Standing balance comment: Reliant on B UE support for steadying, min guard                            Cognition Arousal/Alertness: Awake/alert Behavior During Therapy: WFL for tasks assessed/performed Overall Cognitive Status: Within Functional Limits for tasks assessed                                        Exercises      General Comments        Pertinent Vitals/Pain Pain Assessment: Faces Faces Pain Scale: Hurts a little bit Pain Location: abdominal discomfort, general discomfort, left knee stiffness Pain Descriptors / Indicators: Discomfort;Tightness Pain Intervention(s): Monitored during session;Repositioned    Home Living                      Prior Function            PT Goals (current goals can now  be found in the care plan section) Acute Rehab PT Goals Patient Stated Goal: Get NG tube out, get stronger PT Goal Formulation: With patient Time For Goal Achievement: 05/28/21 Potential to Achieve Goals: Good Progress towards PT goals: Progressing toward goals    Frequency    Min 3X/week      PT Plan Current plan remains appropriate    Co-evaluation              AM-PAC PT "6 Clicks" Mobility   Outcome Measure  Help needed turning from your back to your side while in a flat bed without using bedrails?: A Little Help needed moving from lying on your back to sitting on the side of a flat  bed without using bedrails?: A Little Help needed moving to and from a bed to a chair (including a wheelchair)?: A Little Help needed standing up from a chair using your arms (e.g., wheelchair or bedside chair)?: A Little Help needed to walk in hospital room?: A Little Help needed climbing 3-5 steps with a railing? : A Lot 6 Click Score: 17    End of Session Equipment Utilized During Treatment: Gait belt Activity Tolerance: Patient tolerated treatment well Patient left: in chair;with call bell/phone within reach Nurse Communication: Mobility status;Other (comment) (IV beeping, patient did not want NG hooked up to suction upon return to room.) PT Visit Diagnosis: Muscle weakness (generalized) (M62.81);Other abnormalities of gait and mobility (R26.89)     Time: [id number] PT Time Calculation (min) (ACUTE ONLY): 28 min  Charges:  $Gait Training: 23-37 mins                    Joshuajames Moehring, PT, GCS 05/14/21,11:40 AM

## 2021-05-14 NOTE — Progress Notes (Signed)
PROGRESS NOTE    Gary Rice [facility]  VCB:[id number] DOB: 1931-04-17 DOA: 05/02/2021 PCP: Josetta Huddle, MD   Brief Narrative: Gary Rice is a 87 y.o. male with history of SVT, hyperlipidemia, bilateral inguinal hernias.  Patient presented secondary to vomiting and was found to have new onset atrial fibrillation with RVR in addition to mechanical SBO with transition point and associated incarcerated right inguinal hernia.  Patient underwent urgent repair with mesh placement in addition to subsequent diagnostic laparoscopy with small bowel resection and primary anastomosis.  During hospitalization, patient developed evidence of intra-abdominal infection and CT abdomen/pelvis confirmed a large pelvic abscess.  Interventional radiology was consulted for percutaneous drain placement which was performed on 8/16.   Assessment & Plan:   Principal Problem:   Atrial fibrillation with rapid ventricular response (HCC) Active Problems:   SBO (small bowel obstruction) (HCC)   Leukocytosis   Hyperlipidemia   Thoracic aortic aneurysm (HCC)   Malnutrition of moderate degree   SBO with incarcerated right inguinal hernia General surgery consulted. Patient underwent urgent repair of incarcerated hernia with mesh placement on 8/6. Diagnostic laparoscopy performed on 8/12 secondary to ongoing LLQ ischemia/microperforation and underwent open small bowel resection with primary anastomosis. NG tube in place from 8/6. TPN started on 8/14. -General surgery recommendations: clamp NG tube today, advanced to sips of clears, possible discontinue NG tube tomorrow  Intraabdominal abscess CT abdomen/pelvis on 8/16 significant for a 12 x 11 x 10 cm consistent with a pelvic abscess. IR consulted and performed CT guided drain placement on 8/16. Wound culture (8/16) significant for Klebsiella oxytoca. -Continue Zosyn IV -Follow up final blood culture results  Intractable nausea/vomiting In setting of above  problems. NG tube placed with intermittent suction.   New onset atrial fibrillation with RVR In setting of above problems. Cardiology consulted. Patient stated on Cardizem IV in addition to metoprolol IV prn. Transthoracic Echocardiogram significant for normal EF. Patient is on Cardizem prn for rapid heart beat. -Continue Cardizem IV drip while NPO; transition to oral Cardizem and Eliquis once taking PO reliably.  Hyperlipidemia Patient is on Lipitor as an outpatient. Held since no NPO -Restart Lipitor once able to tolerate PO  Ascending thoracic aortic aneurysm Seen on CT imaging. 4.8 cm dilation. Recommendation for semiannual image follow-up with CTA or MRA, in addition to CT surgery follow-up.  Leukocytosis Initially secondary to SBO with incarcerated with eventual improvement. Recurrent leukocytosis in setting of intraabdominal abscess; trended down slightly after drain placement.   DVT prophylaxis: Lovenox Code Status:   Code Status: Full Code Family Communication: None at bedside Disposition Plan: Discharge to home versus SNF pending ability to tolerate PO intake, wean TPN, transition to oral antibiotics.   Consultants:  General surgery Interventional radiology  Procedures:  OPEN RIGHT INGUINAL HERNIA REPAIR WITH MESH (05/03/2021)  TRANSTHORACIC ECHOCARDIOGRAM (05/04/2021) IMPRESSIONS     1. Left ventricular ejection fraction, by estimation, is 60 to 65%. The  left ventricle has normal function. The left ventricle has no regional  wall motion abnormalities. There is mild left ventricular hypertrophy.  Left ventricular diastolic parameters  are indeterminate.   2. Right ventricular systolic function is normal. The right ventricular  size is normal. Tricuspid regurgitation signal is inadequate for assessing  PA pressure.   3. The mitral valve is degenerative. Trivial mitral valve regurgitation.  No evidence of mitral stenosis.   4. The aortic valve is grossly normal. There  is mild thickening of the  aortic valve. Aortic valve regurgitation is  trivial. No aortic stenosis is  present.   5. The inferior vena cava is dilated in size with >50% respiratory  variability, suggesting right atrial pressure of 8 mmHg.  DIAGNOSTIC LAPAROSCOPY/OPEN SMALL BOWEL RESECTION WITH PRIMARY ANASTOMOSIS (05/09/2021)  Antimicrobials: Zosyn IV    Subjective: Patient reports no bowel movements but has multiple episodes of flatus. No significant abdominal pain.   Objective: Vitals:   05/13/21 1733 05/13/21 2012 05/13/21 2319 05/14/21 0730  BP: 120/68 127/70 129/70 121/64  Pulse: 74 80 75 76  Resp: _0 Temp: 97.9 F (36.6 C) 98.4 F (36.9 C) 98.2 F (36.8 C) 98.3 F (36.8 C)  TempSrc: Oral Oral Oral Oral  SpO2: 93% 94% 94% 92%  Weight:      Height:        Intake/Output Summary (Last 24 hours) at 05/14/2021 0906 Last data filed at 05/14/2021 0514 Gross per 24 hour  Intake 1787.82 ml  Output 2245 ml  Net -457.18 ml   Filed Weights   05/02/21 1805  Weight: 77.1 kg    Examination:  General exam: Appears calm and comfortable. NG tube in place Respiratory system: Clear to auscultation. Respiratory effort normal. Cardiovascular system: S1 & S2 heard, RRR. No murmurs, rubs, gallops or clicks. Gastrointestinal system: Abdomen is nondistended, soft and nontender. No organomegaly or masses felt. No bowel sounds heard. JP drain with serosanguinous fluid Central nervous system: Alert and oriented. No focal neurological deficits. Musculoskeletal: No edema. No calf tenderness Skin: No cyanosis. No rashes Psychiatry: Judgement and insight appear normal. Mood & affect appropriate.     Data Reviewed: I have personally reviewed following labs and imaging studies  CBC Lab Results  Component Value Date   WBC 20.3 (H) 05/14/2021   RBC 3.74 (L) 05/14/2021   HGB 11.9 (L) 05/14/2021   HCT 34.6 (L) 05/14/2021   MCV 92.5 05/14/2021   MCH 31.8 05/14/2021   PLT 263  05/14/2021   MCHC 34.4 05/14/2021   RDW 13.1 05/14/2021   LYMPHSABS 2.2 05/02/2021   MONOABS 2.0 (H) 05/02/2021   EOSABS 0.0 05/02/2021   BASOSABS 0.0 38/32/9191     Last metabolic panel Lab Results  Component Value Date   NA 132 (L) 05/14/2021   K 3.8 05/14/2021   CL 102 05/14/2021   CO2 25 05/14/2021   BUN 15 05/14/2021   CREATININE 0.63 05/14/2021   GLUCOSE 143 (H) 05/14/2021   GFRNONAA >60 05/14/2021   GFRAA  08/14/2007    >60        The eGFR has been calculated using the MDRD equation. This calculation has not been validated in all clinical   CALCIUM 8.1 (L) 05/14/2021   PHOS 3.2 05/14/2021   PROT 5.5 (L) 05/12/2021   ALBUMIN 2.0 (L) 05/12/2021   BILITOT 0.6 05/12/2021   ALKPHOS 51 05/12/2021   AST 21 05/12/2021   ALT 19 05/12/2021   ANIONGAP 5 05/14/2021    CBG (last 3)  Recent Labs    05/13/21 2338 05/14/21 0356 05/14/21 0727  GLUCAP 152* 157* 139*     GFR: Estimated Creatinine Clearance: 68 mL/min (by C-G formula based on SCr of 0.63 mg/dL).  Coagulation Profile: No results for input(s): INR, PROTIME in the last 168 hours.  Recent Results (from the past 240 hour(s))  Aerobic/Anaerobic Culture w Gram Stain (surgical/deep wound)     Status: None (Preliminary result)   Collection Time: 05/13/21  4:49 PM   Specimen: Fluid; Abscess  Result Value Ref Range  Status   Specimen Description FLUID PELVIS  Final   Special Requests NONE  Final   Gram Stain   Final    NO SQUAMOUS EPITHELIAL CELLS SEEN RARE WBC SEEN ABUNDANT GRAM NEGATIVE RODS FEW GRAM VARIABLE ROD    Culture   Final    CULTURE REINCUBATED FOR BETTER GROWTH Performed at [facility], [place] [address] [place], [place] [address]    Report Status PENDING  Incomplete        Radiology Studies: CT ABDOMEN PELVIS W CONTRAST  Result Date: 05/13/2021 CLINICAL DATA:  History of right inguinal hernia repair and small-bowel resection. Leukocytosis. EXAM: CT ABDOMEN AND PELVIS WITH  CONTRAST TECHNIQUE: Multidetector CT imaging of the abdomen and pelvis was performed using the standard protocol following bolus administration of intravenous contrast. CONTRAST:  5m OMNIPAQUE IOHEXOL 350 MG/ML SOLN COMPARISON:  CT scan 05/05/2021 FINDINGS: Lower chest: Small bilateral pleural effusions, right larger than left with overlying atelectasis. No definite infiltrates or pneumothorax. Stable 4 mm left lower lobe pulmonary nodule. The heart is normal in size. No pericardial effusion. There is an NG tube coursing down the esophagus and into the stomach. Stable aortic and coronary artery calcifications. Hepatobiliary: A few small scattered hepatic cysts are again noted. No worrisome hepatic lesions or intrahepatic biliary dilatation. Gallbladder demonstrates layering gallstones but no findings for acute cholecystitis. No common bile duct dilatation. Pancreas: No mass, inflammation or ductal dilatation. Spleen: Normal size.  No focal lesions. Adrenals/Urinary Tract: The adrenal glands unremarkable stable. Stable renal cysts. No worrisome renal lesions or hydronephrosis. There is gas in the bladder are likely from recent catheterization. Stomach/Bowel: The stomach is unremarkable. It contains an NG tube. The duodenum is unremarkable. Slightly distended small bowel loops but no obstructive findings. Contrast gets all the way to the colon. Postoperative changes in the left lower quadrant with anastomosis sutures. There is surrounding small enhancing fluid collections and significant inflammatory type changes. There is also a small amount of free air and. There is a slightly larger interloop rim enhancing fluid collection measuring 3 cm on image number 58/5 which contains a small amount of gas. This is likely a small abscess. There is a large rim enhancing fluid collection in the pelvis measuring approximately 12 x 11 x 10 cm and is located mainly between the bladder and the rectum. I do not see any gas in this  fluid collection but it is consistent with a pelvic abscess. Vascular/Lymphatic: Stable vascular calcifications. Scattered mesenteric and retroperitoneal lymph nodes but no mass or overt adenopathy the. Reproductive: The prostate gland and seminal vesicles are grossly normal. Other: Surgical changes from recent right inguinal hernia repair with small amount fluid and gas. The large left inguinal hernia is stable. It contains small bowel loops but no findings for incarceration or obstruction. Musculoskeletal: Stable scoliosis and advanced degenerative lumbar spondylosis. No acute bony findings. IMPRESSION: 1. Postoperative changes in the left lower quadrant from recent small bowel resection with anastomosis sutures. Small surrounding rim enhancing fluid collections and marked inflammatory changes consistent with small abscesses. No leaking oral contrast is demonstrated. 2. Large rim enhancing fluid collection in the pelvis measuring approximately 12 x 11 x 10 cm consistent with a pelvic abscess. 3. Small bilateral pleural effusions, right larger than left with overlying atelectasis. 4. Cholelithiasis. 5. Stable large left inguinal hernia containing small bowel loops but no findings for incarceration or obstruction. 6. Stable 4 mm left lower lobe pulmonary nodule. These results will be called to the  ordering clinician or representative by the Radiologist Assistant, and communication documented in the PACS or [employer]. Aortic Atherosclerosis (ICD10-I70.0). Electronically Signed   By: Marijo Sanes M.D.   On: 05/13/2021 14:04        Scheduled Meds:  acetaminophen (TYLENOL) oral liquid 160 mg/5 mL  1,000 mg Per Tube Q6H   Chlorhexidine Gluconate Cloth  6 each Topical Daily   enoxaparin (LOVENOX) injection  40 mg Subcutaneous Q24H   insulin aspart  0-9 Units Subcutaneous Q4H   pantoprazole (PROTONIX) IV  40 mg Intravenous Q24H   sodium chloride flush  10-40 mL Intracatheter Q12H   sodium chloride  flush  5 mL Intracatheter Q8H   Continuous Infusions:  diltiazem (CARDIZEM) infusion 15 mg/hr (05/14/21 0252)   lactated ringers 10 mL/hr at 05/14/21 0013   methocarbamol (ROBAXIN) IV     piperacillin-tazobactam (ZOSYN)  IV 3.375 g (05/14/21 0510)   promethazine (PHENERGAN) injection (IM or IVPB) 70 mL/hr at 05/04/21 0230   TPN ADULT (ION) 70 mL/hr at 05/14/21 0013   TPN ADULT (ION)       LOS: 12 days     Cordelia Poche, MD [facility] 05/14/2021, 9:06 AM  If 7PM-7AM, please contact night-coverage [email or web address]

## 2021-05-15 ENCOUNTER — Inpatient Hospital Stay (HOSPITAL_COMMUNITY): Payer: Medicare Other

## 2021-05-15 DIAGNOSIS — E440 Moderate protein-calorie malnutrition: Secondary | ICD-10-CM

## 2021-05-15 LAB — GLUCOSE, CAPILLARY
Glucose-Capillary: 130 mg/dL — ABNORMAL HIGH (ref 70–99)
Glucose-Capillary: 136 mg/dL — ABNORMAL HIGH (ref 70–99)
Glucose-Capillary: 146 mg/dL — ABNORMAL HIGH (ref 70–99)
Glucose-Capillary: 156 mg/dL — ABNORMAL HIGH (ref 70–99)
Glucose-Capillary: 157 mg/dL — ABNORMAL HIGH (ref 70–99)
Glucose-Capillary: 164 mg/dL — ABNORMAL HIGH (ref 70–99)
Glucose-Capillary: 164 mg/dL — ABNORMAL HIGH (ref 70–99)

## 2021-05-15 LAB — COMPREHENSIVE METABOLIC PANEL
ALT: 104 U/L — ABNORMAL HIGH (ref 0–44)
AST: 78 U/L — ABNORMAL HIGH (ref 15–41)
Albumin: 1.9 g/dL — ABNORMAL LOW (ref 3.5–5.0)
Alkaline Phosphatase: 99 U/L (ref 38–126)
Anion gap: 5 (ref 5–15)
BUN: 14 mg/dL (ref 8–23)
CO2: 22 mmol/L (ref 22–32)
Calcium: 8.1 mg/dL — ABNORMAL LOW (ref 8.9–10.3)
Chloride: 106 mmol/L (ref 98–111)
Creatinine, Ser: 0.58 mg/dL — ABNORMAL LOW (ref 0.61–1.24)
GFR, Estimated: >60 mL/min (ref >60)
Glucose, Bld: 134 mg/dL — ABNORMAL HIGH (ref 70–99)
Potassium: 4 mmol/L (ref 3.5–5.1)
Sodium: 133 mmol/L — ABNORMAL LOW (ref 135–145)
Total Bilirubin: 0.8 mg/dL (ref 0.3–1.2)
Total Protein: 5.6 g/dL — ABNORMAL LOW (ref 6.5–8.1)

## 2021-05-15 LAB — CBC
HCT: 35.5 % — ABNORMAL LOW (ref 39.0–52.0)
Hemoglobin: 12.2 g/dL — ABNORMAL LOW (ref 13.0–17.0)
MCH: 31.3 pg (ref 26.0–34.0)
MCHC: 34.4 g/dL (ref 30.0–36.0)
MCV: 91 fL (ref 80.0–100.0)
Platelets: 317 10*3/uL (ref 150–400)
RBC: 3.9 MIL/uL — ABNORMAL LOW (ref 4.22–5.81)
RDW: 13.2 % (ref 11.5–15.5)
WBC: 17.4 10*3/uL — ABNORMAL HIGH (ref 4.0–10.5)
nRBC: 0 % (ref 0.0–0.2)

## 2021-05-15 LAB — PHOSPHORUS: Phosphorus: 3.1 mg/dL (ref 2.5–4.6)

## 2021-05-15 LAB — MAGNESIUM: Magnesium: 2 mg/dL (ref 1.7–2.4)

## 2021-05-15 MED ORDER — ALTEPLASE 2 MG IJ SOLR
2.0000 mg | Freq: Once | INTRAMUSCULAR | Status: AC
  Administered 2021-05-15: 2 mg
  Filled 2021-05-15: qty 2

## 2021-05-15 MED ORDER — ALTEPLASE 2 MG IJ SOLR
2.0000 mg | Freq: Once | INTRAMUSCULAR | Status: AC
  Administered 2021-05-15: 2 mg

## 2021-05-15 MED ORDER — BOOST / RESOURCE BREEZE PO LIQD CUSTOM
1.0000 | Freq: Three times a day (TID) | ORAL | Status: DC
  Administered 2021-05-15 – 2021-05-19 (×3): 1 via ORAL

## 2021-05-15 MED ORDER — TRACE MINERALS CU-MN-SE-ZN 300-55-60-3000 MCG/ML IV SOLN
INTRAVENOUS | Status: AC
  Filled 2021-05-15: qty 924

## 2021-05-15 NOTE — Progress Notes (Addendum)
VAST RN attempted to remove TPA from red lumen of RA TL PICC without success. Red lumen flushed with NS easily but blood return still unobtainable.  Chest x-ray obtained and PICC appears unremarkable per radiologist and PICC RN.  1006 - TPA instilled via gray lumen of RA TL PICC to dwell for at least 2 hours.

## 2021-05-15 NOTE — Progress Notes (Signed)
Physical Therapy Treatment Patient Details Name: Gary Rice MRN: [id number] DOB: [birth year] Today's Date: 05/15/2021    History of Present Illness 87 yo male admitted 8/522 after being referred to ED by PCP secondary to incarcerated inguinal hernia causing SBO, also found to be in Afib with RVR in the ED. Received surgical repair of incarcerated inguinal hernia 05/02/21. NGT placed to wall suction 8/6. PMH hernia repair joint replacement. Drain placement yesterday 05/13/21    PT Comments    Patient received in bed, RN in room. Patient agreeable to PT session. Patient is slow to move, requires cues and supervision for bed mobility. Found to be soiled in bed and required bed, gown change and clean up. Patient transferred to [facility] for continued BM and clean up, then ambulated with RW and min guard 175 feet. Slow, steady pace. Patient will continue to benefit from skilled PT while here to improve strength and functional independence.         Follow Up Recommendations  Home health PT     Equipment Recommendations  Rolling walker with 5" wheels    Recommendations for Other Services       Precautions / Restrictions Precautions Precautions: Fall Precaution Comments: NG removed today Restrictions Weight Bearing Restrictions: No    Mobility  Bed Mobility Overal bed mobility: Needs Assistance Bed Mobility: Supine to Sit     Supine to sit: Supervision     General bed mobility comments: Sup for increased time, use of rails, and safety    Transfers Overall transfer level: Needs assistance Equipment used: Rolling walker (2 wheeled) Transfers: Sit to/from [facility] Sit to Stand: Min guard Stand pivot transfers: Min guard       General transfer comment: Assist for safety and lines. Pt uses momentum to assist with rise to stand.  Ambulation/Gait Ambulation/Gait assistance: Min guard Gait Distance (Feet): 175 Feet Assistive device: Rolling walker (2  wheeled) Gait Pattern/deviations: Step-through pattern;Decreased step length - right;Decreased step length - left;Decreased stride length Gait velocity: decr   General Gait Details: patient with slow, steady cadence. No lob. Several brief standing rest breaks due to fatigue.   Stairs             Wheelchair Mobility    Modified Rankin (Stroke Patients Only)       Balance Overall balance assessment: Needs assistance Sitting-balance support: Feet supported Sitting balance-Leahy Scale: Normal     Standing balance support: Bilateral upper extremity supported;During functional activity Standing balance-Leahy Scale: Fair Standing balance comment: Reliant on B UE support for steadying, min guard                            Cognition Arousal/Alertness: Awake/alert Behavior During Therapy: WFL for tasks assessed/performed Overall Cognitive Status: Within Functional Limits for tasks assessed                                 General Comments: Pt self-directed      Exercises      General Comments        Pertinent Vitals/Pain Pain Assessment: Faces Faces Pain Scale: Hurts a little bit Pain Location: abdominal discomfort, general discomfort, left knee stiffness Pain Descriptors / Indicators: Discomfort;Tightness Pain Intervention(s): Monitored during session;Repositioned    Home Living  Prior Function            PT Goals (current goals can now be found in the care plan section) Acute Rehab PT Goals Patient Stated Goal: improve mobility and strength PT Goal Formulation: With patient Time For Goal Achievement: 05/28/21 Potential to Achieve Goals: Good Progress towards PT goals: Progressing toward goals    Frequency    Min 3X/week      PT Plan Current plan remains appropriate    Co-evaluation              AM-PAC PT "6 Clicks" Mobility   Outcome Measure  Help needed turning from your back to  your side while in a flat bed without using bedrails?: A Little Help needed moving from lying on your back to sitting on the side of a flat bed without using bedrails?: A Little Help needed moving to and from a bed to a chair (including a wheelchair)?: A Little Help needed standing up from a chair using your arms (e.g., wheelchair or bedside chair)?: A Little Help needed to walk in hospital room?: A Little Help needed climbing 3-5 steps with a railing? : A Lot 6 Click Score: 17    End of Session Equipment Utilized During Treatment: Gait belt Activity Tolerance: Patient tolerated treatment well Patient left: in chair;with call bell/phone within reach Nurse Communication: Mobility status PT Visit Diagnosis: Muscle weakness (generalized) (M62.81);Difficulty in walking, not elsewhere classified (R26.2);Other abnormalities of gait and mobility (R26.89)     Time: [id number] PT Time Calculation (min) (ACUTE ONLY): 43 min  Charges:  $Gait Training: 8-22 mins $Therapeutic Activity: 23-37 mins                    [employer], PT, [facility] 05/15/21,2:06 PM

## 2021-05-15 NOTE — Progress Notes (Addendum)
6 Days Post-Op  Subjective: CC: Patient reports no abdominal pain this am. He tolerated ngt clamping without n/v. Passing flatus. Small BM yesterday. Reports his dressing was changed yesterday.   Objective: Vital signs in last 24 hours: Temp:  [98.2 F (36.8 C)-98.4 F (36.9 C)] 98.4 F (36.9 C) (08/18 0753) Pulse Rate:  [75] 75 (08/18 0753) Resp:  [17-19] 18 (08/18 0753) BP: (111-122)/(65-70) 117/68 (08/18 0753) SpO2:  [94 %-96 %] 94 % (08/18 0753) Last BM Date: 05/07/21  Intake/Output from previous day: 08/17 0701 - 08/18 0700 In: 1226.6 [I.V.:1051.2; IV Piggyback:165.5] Out: 1105 [Urine:1050; Drains:55] Intake/Output this shift: Total I/O In: -  Out: 300 [Urine:300]  PE: Gen:  Alert, NAD, pleasant Card:  Reg rate Pulm: normal rate and effort  Abd: Very soft with mild distension. He has mild tenderness around his wound and is otherwise NT. Midline wound with healthy granulation tissue on the edges. Base as noted below. There is dehiscence without evisceration. Thin brown drainage. RLQ incision is c/d/I. Staples in place c/d/I. +BS, NGT clamped. Left inguinal hernia partially reducible, NT and without skin changes.  Perc drain is TG and with slightly cloudy serosang output.    Lab Results:  Recent Labs    05/13/21 0355 05/14/21 0400  WBC 22.3* 20.3*  HGB 12.8* 11.9*  HCT 37.5* 34.6*  PLT 298 263   BMET Recent Labs    05/13/21 0355 05/14/21 0400  NA 132* 132*  K 3.7 3.8  CL 100 102  CO2 23 25  GLUCOSE 124* 143*  BUN 16 15  CREATININE 0.70 0.63  CALCIUM 8.2* 8.1*   PT/INR No results for input(s): LABPROT, INR in the last 72 hours. CMP     Component Value Date/Time   NA 132 (L) 05/14/2021 0400   K 3.8 05/14/2021 0400   CL 102 05/14/2021 0400   CO2 25 05/14/2021 0400   GLUCOSE 143 (H) 05/14/2021 0400   BUN 15 05/14/2021 0400   CREATININE 0.63 05/14/2021 0400   CALCIUM 8.1 (L) 05/14/2021 0400   PROT 5.5 (L) 05/12/2021 0329   ALBUMIN 2.0 (L)  05/12/2021 0329   AST 21 05/12/2021 0329   ALT 19 05/12/2021 0329   ALKPHOS 51 05/12/2021 0329   BILITOT 0.6 05/12/2021 0329   GFRNONAA >60 05/14/2021 0400   GFRAA  08/14/2007 0312    >60        The eGFR has been calculated using the MDRD equation. This calculation has not been validated in all clinical   Lipase     Component Value Date/Time   LIPASE 27 05/02/2021 1806    Studies/Results: CT ABDOMEN PELVIS W CONTRAST  Result Date: 05/13/2021 CLINICAL DATA:  History of right inguinal hernia repair and small-bowel resection. Leukocytosis. EXAM: CT ABDOMEN AND PELVIS WITH CONTRAST TECHNIQUE: Multidetector CT imaging of the abdomen and pelvis was performed using the standard protocol following bolus administration of intravenous contrast. CONTRAST:  41m OMNIPAQUE IOHEXOL 350 MG/ML SOLN COMPARISON:  CT scan 05/05/2021 FINDINGS: Lower chest: Small bilateral pleural effusions, right larger than left with overlying atelectasis. No definite infiltrates or pneumothorax. Stable 4 mm left lower lobe pulmonary nodule. The heart is normal in size. No pericardial effusion. There is an NG tube coursing down the esophagus and into the stomach. Stable aortic and coronary artery calcifications. Hepatobiliary: A few small scattered hepatic cysts are again noted. No worrisome hepatic lesions or intrahepatic biliary dilatation. Gallbladder demonstrates layering gallstones but no findings for acute cholecystitis. No common  bile duct dilatation. Pancreas: No mass, inflammation or ductal dilatation. Spleen: Normal size.  No focal lesions. Adrenals/Urinary Tract: The adrenal glands unremarkable stable. Stable renal cysts. No worrisome renal lesions or hydronephrosis. There is gas in the bladder are likely from recent catheterization. Stomach/Bowel: The stomach is unremarkable. It contains an NG tube. The duodenum is unremarkable. Slightly distended small bowel loops but no obstructive findings. Contrast gets all the  way to the colon. Postoperative changes in the left lower quadrant with anastomosis sutures. There is surrounding small enhancing fluid collections and significant inflammatory type changes. There is also a small amount of free air and. There is a slightly larger interloop rim enhancing fluid collection measuring 3 cm on image number 58/5 which contains a small amount of gas. This is likely a small abscess. There is a large rim enhancing fluid collection in the pelvis measuring approximately 12 x 11 x 10 cm and is located mainly between the bladder and the rectum. I do not see any gas in this fluid collection but it is consistent with a pelvic abscess. Vascular/Lymphatic: Stable vascular calcifications. Scattered mesenteric and retroperitoneal lymph nodes but no mass or overt adenopathy the. Reproductive: The prostate gland and seminal vesicles are grossly normal. Other: Surgical changes from recent right inguinal hernia repair with small amount fluid and gas. The large left inguinal hernia is stable. It contains small bowel loops but no findings for incarceration or obstruction. Musculoskeletal: Stable scoliosis and advanced degenerative lumbar spondylosis. No acute bony findings. IMPRESSION: 1. Postoperative changes in the left lower quadrant from recent small bowel resection with anastomosis sutures. Small surrounding rim enhancing fluid collections and marked inflammatory changes consistent with small abscesses. No leaking oral contrast is demonstrated. 2. Large rim enhancing fluid collection in the pelvis measuring approximately 12 x 11 x 10 cm consistent with a pelvic abscess. 3. Small bilateral pleural effusions, right larger than left with overlying atelectasis. 4. Cholelithiasis. 5. Stable large left inguinal hernia containing small bowel loops but no findings for incarceration or obstruction. 6. Stable 4 mm left lower lobe pulmonary nodule. These results will be called to the ordering clinician or  representative by the Radiologist Assistant, and communication documented in the PACS or [employer]. Aortic Atherosclerosis (ICD10-I70.0). Electronically Signed   By: Marijo Sanes M.D.   On: 05/13/2021 14:04   DG CHEST PORT 1 VIEW  Result Date: 05/15/2021 CLINICAL DATA:  PICC line complication. EXAM: PORTABLE CHEST 1 VIEW COMPARISON:  Chest x-ray dated May 11, 2021. FINDINGS: Slight interval retraction in the tip of the right upper extremity PICC line, though it remains within the mid SVC. Unchanged enteric tube. Stable cardiomediastinal silhouette. No focal consolidation, pleural effusion, or pneumothorax. No acute osseous abnormality. IMPRESSION: 1. Slight interval retraction of the right upper extremity PICC line, though it remains within the mid SVC. 2. No active disease. Electronically Signed   By: Titus Dubin M.D.   On: 05/15/2021 08:58   CT IMAGE GUIDED DRAINAGE BY PERCUTANEOUS CATHETER  Result Date: 05/14/2021 INDICATION: 87 year old gentleman with rim enhancing pelvic fluid collection presents to IR for drain placement EXAM: CT GUIDED DRAINAGE OF PELVIC ABSCESS MEDICATIONS: The patient is currently admitted to the hospital and receiving intravenous antibiotics. The antibiotics were administered within an appropriate time frame prior to the initiation of the procedure. ANESTHESIA/SEDATION: 1 mg IV Versed 50 mcg IV Fentanyl Moderate Sedation Time:  11 minutes The patient was continuously monitored during the procedure by the interventional radiology nurse under my direct supervision.  COMPLICATIONS: None immediate. TECHNIQUE: Informed written consent was obtained from the patient after a thorough discussion of the procedural risks, benefits and alternatives. All questions were addressed. Maximal Sterile Barrier Technique was utilized including caps, mask, sterile gowns, sterile gloves, sterile drape, hand hygiene and skin antiseptic. A timeout was performed prior to the initiation of the  procedure. PROCEDURE: The operative field was prepped with Chlorhexidine in a sterile fashion, and a sterile drape was applied covering the operative field. A sterile gown and sterile gloves were used for the procedure. Local anesthesia was provided with 1% Lidocaine. Patient position prone on the procedure table. The right gluteal skin prepped and draped in usual fashion. Following local lidocaine administration, 18 gauge trocar needle was advanced into the pelvic fluid collection utilizing CT guidance. The needle was exchanged for 10.2 [place] multipurpose pigtail drain over 0.035 inch guidewire. Drain secured to skin with suture and connected to bulb. 50 mL sample was aspirated and sent for Gram stain and culture. IMPRESSION: 10.2 [place] multipurpose pigtail drain placed and pelvic fluid collection. Electronically Signed   By: Miachel Roux M.D.   On: 05/14/2021 12:40    Anti-infectives: Anti-infectives (From admission, onward)    Start     Dose/Rate Route Frequency Ordered Stop   05/13/21 1515  piperacillin-tazobactam (ZOSYN) IVPB 3.375 g        3.375 g 12.5 mL/hr over 240 Minutes Intravenous Every 8 hours 05/13/21 1421     05/09/21 0915  ceFAZolin (ANCEF) IVPB 2g/100 mL premix        2 g 200 mL/hr over 30 Minutes Intravenous On call to O.R. 05/09/21 0825 05/09/21 1050        Assessment/Plan POD 13, s/p open right inguinal hernia repair with mesh by Dr. Bobbye Morton for incarcerated RIH with persistent ileus vs SBO POD 6, s/p dx lap with mini laparotomy with SBR, Dr. Donne Hazel 05/09/21 - D/c NGT. Allow CLD - Inc to TID WD dressing changes to midline wound. Wound  - S/p IR drainage 8/16 for IA fluid collection. Cx's w/ Klebsiella Oxytoca. WBC/labs pending this am. Cont abx.  - IS/pulm toilet - Mobilize TID in halls - Cont TNA until tolerating some orals - Will need staples out early next week  FEN - TNA. CLD. D/c NGT. IVF per TRH VTE - SCDs, Lovenox ID - none   New onset a fib Ascending  thoracic aneurysm HLD   LOS: 13 days    Jillyn Ledger , [facility] 05/15/2021, 9:16 AM Please see Amion for pager number during day hours 7:00am-4:30pm

## 2021-05-15 NOTE — Progress Notes (Signed)
Consult for PICC de-clotting. No blood return from red or grey lumen. Nutrition and amiodorone currently infusing. Alteplase instilled in red lumen.  PICC was placed on 8/14, consider chest Xray for tip placement if alteplase ineffective.

## 2021-05-15 NOTE — Progress Notes (Signed)
PROGRESS NOTE    Gary Rice [facility]  WPY:[id number] DOB: 06-28-1931 DOA: 05/02/2021 PCP: Josetta Huddle, MD   Brief Narrative: Gary Rice is a 87 y.o. male with history of SVT, hyperlipidemia, bilateral inguinal hernias.  Patient presented secondary to vomiting and was found to have new onset atrial fibrillation with RVR in addition to mechanical SBO with transition point and associated incarcerated right inguinal hernia.  Patient underwent urgent repair with mesh placement in addition to subsequent diagnostic laparoscopy with small bowel resection and primary anastomosis.  During hospitalization, patient developed evidence of intra-abdominal infection and CT abdomen/pelvis confirmed a large pelvic abscess.  Interventional radiology was consulted for percutaneous drain placement which was performed on 8/16.   Assessment & Plan:   Principal Problem:   Atrial fibrillation with rapid ventricular response (HCC) Active Problems:   SBO (small bowel obstruction) (HCC)   Leukocytosis   Hyperlipidemia   Thoracic aortic aneurysm (HCC)   Malnutrition of moderate degree   Pelvic abscess in male [facility])   SBO with incarcerated right inguinal hernia General surgery consulted. Patient underwent urgent repair of incarcerated hernia with mesh placement on 8/6. Diagnostic laparoscopy performed on 8/12 secondary to ongoing LLQ ischemia/microperforation and underwent open small bowel resection with primary anastomosis. NG tube in place from 8/6. TPN started on 8/14. -General surgery recommendations: NG tube removed. Clear liquid diet  Intraabdominal abscess CT abdomen/pelvis on 8/16 significant for a 12 x 11 x 10 cm consistent with a pelvic abscess. IR consulted and performed CT guided drain placement on 8/16. Wound culture (8/16) significant for Klebsiella oxytoca. -Continue Zosyn IV -Follow up final blood culture results  Intractable nausea/vomiting In setting of above problems. NG tube placed  with intermittent suction.   New onset atrial fibrillation with RVR In setting of above problems. Cardiology consulted. Patient stated on Cardizem IV in addition to metoprolol IV prn. Transthoracic Echocardiogram significant for normal EF. Patient is on Cardizem prn for rapid heart beat. -Continue Cardizem IV drip while NPO; transition to oral Cardizem and Eliquis once taking PO reliably.  Hyperlipidemia Patient is on Lipitor as an outpatient. Held since no NPO -Restart Lipitor once able to tolerate PO reliably  Ascending thoracic aortic aneurysm Seen on CT imaging. 4.8 cm dilation. Recommendation for semiannual image follow-up with CTA or MRA, in addition to CT surgery follow-up.  Leukocytosis Initially secondary to SBO with incarcerated with eventual improvement. Recurrent leukocytosis in setting of intraabdominal abscess; trended down slightly after drain placement.   DVT prophylaxis: Lovenox Code Status:   Code Status: Full Code Family Communication: None at bedside Disposition Plan: Discharge to home versus SNF pending ability to tolerate PO intake, wean TPN, transition to oral antibiotics.   Consultants:  General surgery Interventional radiology  Procedures:  OPEN RIGHT INGUINAL HERNIA REPAIR WITH MESH (05/03/2021)  TRANSTHORACIC ECHOCARDIOGRAM (05/04/2021) IMPRESSIONS     1. Left ventricular ejection fraction, by estimation, is 60 to 65%. The  left ventricle has normal function. The left ventricle has no regional  wall motion abnormalities. There is mild left ventricular hypertrophy.  Left ventricular diastolic parameters  are indeterminate.   2. Right ventricular systolic function is normal. The right ventricular  size is normal. Tricuspid regurgitation signal is inadequate for assessing  PA pressure.   3. The mitral valve is degenerative. Trivial mitral valve regurgitation.  No evidence of mitral stenosis.   4. The aortic valve is grossly normal. There is mild  thickening of the  aortic valve. Aortic valve regurgitation is  trivial. No aortic stenosis is  present.   5. The inferior vena cava is dilated in size with >50% respiratory  variability, suggesting right atrial pressure of 8 mmHg.  DIAGNOSTIC LAPAROSCOPY/OPEN SMALL BOWEL RESECTION WITH PRIMARY ANASTOMOSIS (05/09/2021)  Antimicrobials: Zosyn IV    Subjective: No significant abdominal pain. Passing gas. Had a bowel movement. He has been walking around the unit.   Objective: Vitals:   05/15/21 0000 05/15/21 0343 05/15/21 0753 05/15/21 1247  BP: 111/67 113/65 117/68 116/64  Pulse: 75  75 75  Resp: 18 19 18 18   Temp: 98.2 F (36.8 C) 98.4 F (36.9 C) 98.4 F (36.9 C) 98.4 F (36.9 C)  TempSrc: Oral Oral Oral Oral  SpO2: 96% 95% 94% 96%  Weight:      Height:        Intake/Output Summary (Last 24 hours) at 05/15/2021 1507 Last data filed at 05/15/2021 1500 Gross per 24 hour  Intake 2238.97 ml  Output 1810 ml  Net 428.97 ml    Filed Weights   05/02/21 1805  Weight: 77.1 kg    Examination:  General exam: Appears calm and comfortable Respiratory system: Clear to auscultation. Respiratory effort normal. Cardiovascular system: S1 & S2 heard, RRR. No murmurs, rubs, gallops or clicks. Gastrointestinal system: Abdomen is nondistended, soft and nontender. No organomegaly or masses felt. Normal bowel sounds heard. Drain with serosanguinous fluid Central nervous system: Alert and oriented. No focal neurological deficits. Musculoskeletal: No calf tenderness Skin: No cyanosis. No rashes Psychiatry: Judgement and insight appear normal. Mood & affect appropriate.    Data Reviewed: I have personally reviewed following labs and imaging studies  CBC Lab Results  Component Value Date   WBC 17.4 (H) 05/15/2021   RBC 3.90 (L) 05/15/2021   HGB 12.2 (L) 05/15/2021   HCT 35.5 (L) 05/15/2021   MCV 91.0 05/15/2021   MCH 31.3 05/15/2021   PLT 317 05/15/2021   MCHC 34.4 05/15/2021    RDW 13.2 05/15/2021   LYMPHSABS 2.2 05/02/2021   MONOABS 2.0 (H) 05/02/2021   EOSABS 0.0 05/02/2021   BASOSABS 0.0 88/28/0034     Last metabolic panel Lab Results  Component Value Date   NA 133 (L) 05/15/2021   K 4.0 05/15/2021   CL 106 05/15/2021   CO2 22 05/15/2021   BUN 14 05/15/2021   CREATININE 0.58 (L) 05/15/2021   GLUCOSE 134 (H) 05/15/2021   GFRNONAA >60 05/15/2021   GFRAA  08/14/2007    >60        The eGFR has been calculated using the MDRD equation. This calculation has not been validated in all clinical   CALCIUM 8.1 (L) 05/15/2021   PHOS 3.1 05/15/2021   PROT 5.6 (L) 05/15/2021   ALBUMIN 1.9 (L) 05/15/2021   BILITOT 0.8 05/15/2021   ALKPHOS 99 05/15/2021   AST 78 (H) 05/15/2021   ALT 104 (H) 05/15/2021   ANIONGAP 5 05/15/2021    CBG (last 3)  Recent Labs    05/15/21 0432 05/15/21 0749 05/15/21 1244  GLUCAP 146* 156* 164*      GFR: Estimated Creatinine Clearance: 68 mL/min (A) (by C-G formula based on SCr of 0.58 mg/dL (L)).  Coagulation Profile: No results for input(s): INR, PROTIME in the last 168 hours.  Recent Results (from the past 240 hour(s))  Aerobic/Anaerobic Culture w Gram Stain (surgical/deep wound)     Status: None (Preliminary result)   Collection Time: 05/13/21  4:49 PM   Specimen: Fluid; Abscess  Result Value Ref  Range Status   Specimen Description FLUID PELVIS  Final   Special Requests NONE  Final   Gram Stain   Final    NO SQUAMOUS EPITHELIAL CELLS SEEN RARE WBC SEEN ABUNDANT GRAM NEGATIVE RODS FEW GRAM VARIABLE ROD    Culture   Final    MODERATE KLEBSIELLA OXYTOCA CULTURE REINCUBATED FOR BETTER GROWTH Performed at [facility], [place] [address], [place], [place] [address]    Report Status PENDING  Incomplete         Radiology Studies: DG CHEST PORT 1 VIEW  Result Date: 05/15/2021 CLINICAL DATA:  PICC line complication. EXAM: PORTABLE CHEST 1 VIEW COMPARISON:  Chest x-ray dated May 11, 2021. FINDINGS:  Slight interval retraction in the tip of the right upper extremity PICC line, though it remains within the mid SVC. Unchanged enteric tube. Stable cardiomediastinal silhouette. No focal consolidation, pleural effusion, or pneumothorax. No acute osseous abnormality. IMPRESSION: 1. Slight interval retraction of the right upper extremity PICC line, though it remains within the mid SVC. 2. No active disease. Electronically Signed   By: Titus Dubin M.D.   On: 05/15/2021 08:58   CT IMAGE GUIDED DRAINAGE BY PERCUTANEOUS CATHETER  Result Date: 05/14/2021 INDICATION: 87 year old gentleman with rim enhancing pelvic fluid collection presents to IR for drain placement EXAM: CT GUIDED DRAINAGE OF PELVIC ABSCESS MEDICATIONS: The patient is currently admitted to the hospital and receiving intravenous antibiotics. The antibiotics were administered within an appropriate time frame prior to the initiation of the procedure. ANESTHESIA/SEDATION: 1 mg IV Versed 50 mcg IV Fentanyl Moderate Sedation Time:  11 minutes The patient was continuously monitored during the procedure by the interventional radiology nurse under my direct supervision. COMPLICATIONS: None immediate. TECHNIQUE: Informed written consent was obtained from the patient after a thorough discussion of the procedural risks, benefits and alternatives. All questions were addressed. Maximal Sterile Barrier Technique was utilized including caps, mask, sterile gowns, sterile gloves, sterile drape, hand hygiene and skin antiseptic. A timeout was performed prior to the initiation of the procedure. PROCEDURE: The operative field was prepped with Chlorhexidine in a sterile fashion, and a sterile drape was applied covering the operative field. A sterile gown and sterile gloves were used for the procedure. Local anesthesia was provided with 1% Lidocaine. Patient position prone on the procedure table. The right gluteal skin prepped and draped in usual fashion. Following local  lidocaine administration, 18 gauge trocar needle was advanced into the pelvic fluid collection utilizing CT guidance. The needle was exchanged for 10.2 [place] multipurpose pigtail drain over 0.035 inch guidewire. Drain secured to skin with suture and connected to bulb. 50 mL sample was aspirated and sent for Gram stain and culture. IMPRESSION: 10.2 [place] multipurpose pigtail drain placed and pelvic fluid collection. Electronically Signed   By: Miachel Roux M.D.   On: 05/14/2021 12:40        Scheduled Meds:  acetaminophen (TYLENOL) oral liquid 160 mg/5 mL  1,000 mg Per Tube Q6H   Chlorhexidine Gluconate Cloth  6 each Topical Daily   enoxaparin (LOVENOX) injection  40 mg Subcutaneous Q24H   feeding supplement  1 Container Oral TID BM   insulin aspart  0-9 Units Subcutaneous Q4H   pantoprazole (PROTONIX) IV  40 mg Intravenous Q24H   sodium chloride flush  10-40 mL Intracatheter Q12H   sodium chloride flush  5 mL Intracatheter Q8H   Continuous Infusions:  diltiazem (CARDIZEM) infusion 15 mg/hr (05/15/21 0421)   lactated ringers 10 mL/hr at 05/15/21 1500  methocarbamol (ROBAXIN) IV     piperacillin-tazobactam (ZOSYN)  IV 3.375 g (05/14/21 2102)   promethazine (PHENERGAN) injection (IM or IVPB) 70 mL/hr at 05/04/21 0230   TPN ADULT (ION) 70 mL/hr at 05/15/21 1500   TPN ADULT (ION)       LOS: 13 days     Cordelia Poche, MD [facility] 05/15/2021, 3:07 PM  If 7PM-7AM, please contact night-coverage [email or web address]

## 2021-05-15 NOTE — Progress Notes (Signed)
PHARMACY - TOTAL PARENTERAL NUTRITION CONSULT NOTE   Indication: Prolonged ileus, SBO  Patient Measurements: Height: '5\' 11"'$  (180.3 cm) Weight: 77.1 kg (170 lb) IBW/kg (Calculated) : 75.3 TPN AdjBW (KG): 77.1 Body mass index is 23.71 kg/m. Usual Weight:    Assessment: Sent by OP MD 8/5 for r/o SBO. New onset afib (CHA2DS2-VASc 2) . CT showing findings consistent with mechanical SBO with transition point related to right inguinal hernia; no perforation. Has been unable to tolerate orals. Start TPN 8/14.   PMH: SVT, hyperlipidemia, appendectomy, bilateral inguinal hernias, previous hernia repair  Glucose / Insulin: CBGs 140-150s with TPN increase to goal rate, 4 units insulin given with TPN Electrolytes: Labs are unable to be obtained - due to PICC occlusions and patient refusing peripheral stick. 8/17 Na 132, K 3.8, Phos 3.2, Mg 1.9 (goal K+>=4, Mg >=2), CoCa wnl Renal: Scr <1 Hepatic: LFT's WNL. Albumin 2.0. TG 86 Intake / Output; MIVF: UOP 0.6 ml/kg/hr, NG 250>600>61m, Drain 450>55 mL out, LBM 8/17  LR '@KVO'$   GI Meds: IV PPI/24h  GI Imaging: - 8/5: CT showing findings consistent with mechanical SBO with transition point related to right inguinal hernia; no perforation.  Moderate fat and small bowel containing left inguinal hernia without adverse features.  - 8/8: 8/8: Persistent small bowel obstruction, with transition point in the left lower quadrant anteriorly, possibly due to adhesions.  GI Surgeries / Procedures:  8/6:  incarcerated RIGHT inguinal hernia repair with mesh 8/12:  diagnostic lap due to ongoing LLQ ischemia and microperforation requiring resection. Open small bowel resection with primary anastomosis 8/16: IR drain pelvic abscess   Central access: PICC 8/14 TPN start date: 8/14  Nutritional Goals (per RD recommendation on 8/15): kCal: 1850-2050, Protein: 85-100g, Fluid:  >1.85L Goal TPN rate is  70 mL/hr (provides 92 g of protein and  1851 kcals per  day)  Current Nutrition:  NPO TPN  Plan:  Continue TPN at 70 mL/hr at 1800  Electrolytes in TPN: Na 738m/L, K 4033mL, Ca 5mE61m, Inc Mg 10mE51m and Phos 8mmol2m Cl:Ac 1:1 Add standard MVI and trace elements to TPN Continue Sensitive q4h SSI and adjust as needed  Will need TPN labs on Friday in order to safely continue  Cathy Alanda SlimmD, FCCM [facility] Pharmacist Please see AMION for all Pharmacists' Contact Phone Numbers 05/15/2021, 10:08 AM

## 2021-05-15 NOTE — Progress Notes (Signed)
NG tube pulled by RN. Catheter intact upon removal. Patient tolerated well. Fuller Canada, RN

## 2021-05-15 NOTE — Progress Notes (Signed)
Nutrition Follow Up  DOCUMENTATION CODES:   Non-severe (moderate) malnutrition in context of chronic illness  INTERVENTION:   Continue TPN to meet 100% of needs until diet advanced and PO intake remains consistent  Boost Breeze po TID, each supplement provides 250 kcal and 9 grams of protein Obtain new weight   NUTRITION DIAGNOSIS:   Moderate Malnutrition related to chronic illness (recurrent hernias) as evidenced by mild fat depletion, mild muscle depletion, moderate fat depletion, moderate muscle depletion.  Ongoing  GOAL:   Patient will meet greater than or equal to 90% of their needs  Addressed via TPN  MONITOR:   Diet advancement, PO intake, Supplement acceptance, Labs, Weight trends, I & O's  REASON FOR ASSESSMENT:   Consult New TPN/TNA  ASSESSMENT:   87 yo male with a PMH of SVT, HLD, appendectomy, and bilateral inguinal hernias with previous hernia repair who presents with SBO with incarcerated R inguinal hernia.  8/05 - s/p R inguinal hernia repair; NGT placed 8/06 - NGT replaced 8/12 - s/p ex lap, open small bowel resection with primary anastomosis   Diet advanced to clears this am. Tolerated vegetable soup with juice for lunch. Discussed the importance of increased kcal and protein intake to facilitate wean from TPN. Patient amendable to [facility].   TPN running at 70 ml/hr to provide 1851 kcal and 92 grams protein. Continue to meet 100% of needs until diet advanced and PO intake remains consistent.   Admission weight: 77.1 kg  No current weight obtained   UOP: 1050 ml x 24 hrs  JP drain: 55 ml x 24 hrs   Medications: SS novolog Labs: Na 133 (L) CBG 136-164  Diet Order:   Diet Order             Diet clear liquid Room service appropriate? Yes; Fluid consistency: Thin  Diet effective now                  EDUCATION NEEDS:   Not appropriate for education at this time  Skin:  Skin Assessment: Skin Integrity Issues: Skin Integrity  Issues:: Incisions Incisions: R groin, closed and Abdomen, closed  Last BM:  8/17  Height:  Ht Readings from Last 1 Encounters:  05/02/21 '5\' 11"'$  (1.803 m)   Weight:  Wt Readings from Last 1 Encounters:  05/02/21 77.1 kg   BMI:  Body mass index is 23.71 kg/m.  Estimated Nutritional Needs:   Kcal:  2000-2200 kcal  Protein:  95-115 grams  Fluid:  >/= 2 L/day  Mariana Single MS, RD, LDN, CNSC Clinical Nutrition Pager listed in [place]

## 2021-05-15 NOTE — Progress Notes (Signed)
Referring Physician(s): Ramirez,A  Supervising Physician: Corrie Mckusick  Patient Status:  [facility] - In-pt  Chief Complaint:  Abdominal/pelvic pain/abscess  Subjective: Pt cont to have some lower abd discomfort but improved since drain placement; denies N/V   Allergies: Patient has no known allergies.  Medications: Prior to Admission medications   Medication Sig Start Date End Date Taking? Authorizing Provider  aspirin EC 81 MG tablet Take 81 mg by mouth at bedtime.   Yes [provider]  atorvastatin (LIPITOR) 10 MG tablet Take 10 mg by mouth at bedtime. 08/14/15  Yes [provider]  diltiazem (CARDIZEM) 30 MG tablet TAKE 1 TABLET AS NEEDED FOR RAPID HEART BEAT Patient taking differently: Take 30 mg by mouth daily as needed (rapid heart beat). 10/06/17  Yes Isaiah Serge, NP  fluticasone (FLONASE) 50 MCG/ACT nasal spray Place 1 spray into both nostrils daily. 08/14/15  Yes [provider]  Multiple Vitamin (MULTIVITAMIN WITH MINERALS) TABS tablet Take 1 tablet by mouth daily.   Yes [provider]  Multiple Vitamins-Minerals (PRESERVISION AREDS 2) CAPS Take 1 capsule 2 (two) times daily by mouth.   Yes [provider]  VITAMIN E PO Take 1 capsule by mouth daily.    Yes [provider]     Vital Signs: BP 117/68 (BP Location: Left Arm)   Pulse 75   Temp 98.4 F (36.9 C) (Oral)   Resp 18   Ht '5\' 11"'$  (1.803 m)   Wt 170 lb (77.1 kg)   SpO2 94%   BMI 23.71 kg/m   Physical Exam awake/alert; TG/pelvic drain intact, OP 55 cc hazy, blood-tinged fluid; drain flushed without difficulty  Imaging: CT ABDOMEN PELVIS W CONTRAST  Result Date: 05/13/2021 CLINICAL DATA:  History of right inguinal hernia repair and small-bowel resection. Leukocytosis. EXAM: CT ABDOMEN AND PELVIS WITH CONTRAST TECHNIQUE: Multidetector CT imaging of the abdomen and pelvis was performed using the standard protocol following bolus administration of  intravenous contrast. CONTRAST:  99m OMNIPAQUE IOHEXOL 350 MG/ML SOLN COMPARISON:  CT scan 05/05/2021 FINDINGS: Lower chest: Small bilateral pleural effusions, right larger than left with overlying atelectasis. No definite infiltrates or pneumothorax. Stable 4 mm left lower lobe pulmonary nodule. The heart is normal in size. No pericardial effusion. There is an NG tube coursing down the esophagus and into the stomach. Stable aortic and coronary artery calcifications. Hepatobiliary: A few small scattered hepatic cysts are again noted. No worrisome hepatic lesions or intrahepatic biliary dilatation. Gallbladder demonstrates layering gallstones but no findings for acute cholecystitis. No common bile duct dilatation. Pancreas: No mass, inflammation or ductal dilatation. Spleen: Normal size.  No focal lesions. Adrenals/Urinary Tract: The adrenal glands unremarkable stable. Stable renal cysts. No worrisome renal lesions or hydronephrosis. There is gas in the bladder are likely from recent catheterization. Stomach/Bowel: The stomach is unremarkable. It contains an NG tube. The duodenum is unremarkable. Slightly distended small bowel loops but no obstructive findings. Contrast gets all the way to the colon. Postoperative changes in the left lower quadrant with anastomosis sutures. There is surrounding small enhancing fluid collections and significant inflammatory type changes. There is also a small amount of free air and. There is a slightly larger interloop rim enhancing fluid collection measuring 3 cm on image number 58/5 which contains a small amount of gas. This is likely a small abscess. There is a large rim enhancing fluid collection in the pelvis measuring approximately 12 x 11 x 10 cm and is located mainly between the  bladder and the rectum. I do not see any gas in this fluid collection but it is consistent with a pelvic abscess. Vascular/Lymphatic: Stable vascular calcifications. Scattered mesenteric and  retroperitoneal lymph nodes but no mass or overt adenopathy the. Reproductive: The prostate gland and seminal vesicles are grossly normal. Other: Surgical changes from recent right inguinal hernia repair with small amount fluid and gas. The large left inguinal hernia is stable. It contains small bowel loops but no findings for incarceration or obstruction. Musculoskeletal: Stable scoliosis and advanced degenerative lumbar spondylosis. No acute bony findings. IMPRESSION: 1. Postoperative changes in the left lower quadrant from recent small bowel resection with anastomosis sutures. Small surrounding rim enhancing fluid collections and marked inflammatory changes consistent with small abscesses. No leaking oral contrast is demonstrated. 2. Large rim enhancing fluid collection in the pelvis measuring approximately 12 x 11 x 10 cm consistent with a pelvic abscess. 3. Small bilateral pleural effusions, right larger than left with overlying atelectasis. 4. Cholelithiasis. 5. Stable large left inguinal hernia containing small bowel loops but no findings for incarceration or obstruction. 6. Stable 4 mm left lower lobe pulmonary nodule. These results will be called to the ordering clinician or representative by the Radiologist Assistant, and communication documented in the PACS or [employer]. Aortic Atherosclerosis (ICD10-I70.0). Electronically Signed   By: Marijo Sanes M.D.   On: 05/13/2021 14:04   DG CHEST PORT 1 VIEW  Result Date: 05/15/2021 CLINICAL DATA:  PICC line complication. EXAM: PORTABLE CHEST 1 VIEW COMPARISON:  Chest x-ray dated May 11, 2021. FINDINGS: Slight interval retraction in the tip of the right upper extremity PICC line, though it remains within the mid SVC. Unchanged enteric tube. Stable cardiomediastinal silhouette. No focal consolidation, pleural effusion, or pneumothorax. No acute osseous abnormality. IMPRESSION: 1. Slight interval retraction of the right upper extremity PICC line,  though it remains within the mid SVC. 2. No active disease. Electronically Signed   By: Titus Dubin M.D.   On: 05/15/2021 08:58   DG CHEST PORT 1 VIEW  Result Date: 05/11/2021 CLINICAL DATA:  Status post PICC line placement. EXAM: PORTABLE CHEST 1 VIEW COMPARISON:  May 03, 2021 FINDINGS: Sh right PICC line terminates in the central SVC. A prominent tortuous thoracic aorta is stable. The heart, hila, and mediastinum are otherwise unremarkable. No pneumothorax. The lungs are clear. IMPRESSION: 1. The right PICC line terminates in the central SVC. 2. Tortuous prominent thoracic aorta, unchanged. 3. The NG tube terminates in the stomach. 4. No other abnormalities. Electronically Signed   By: Dorise Bullion III M.D.   On: 05/11/2021 17:29   DG Abd Portable 1V  Result Date: 05/12/2021 CLINICAL DATA:  Enteric tube placement. EXAM: PORTABLE ABDOMEN - 1 VIEW COMPARISON:  Abdominal x-ray dated May 07, 2021. FINDINGS: Unchanged enteric tube within the stomach. Mildly improved diffuse small bowel dilatation. Oral contrast again noted within the colon. IMPRESSION: 1. Unchanged enteric tube within the stomach. 2. Mildly improved ileus. Electronically Signed   By: Titus Dubin M.D.   On: 05/12/2021 08:53   CT IMAGE GUIDED DRAINAGE BY PERCUTANEOUS CATHETER  Result Date: 05/14/2021 INDICATION: 87 year old gentleman with rim enhancing pelvic fluid collection presents to IR for drain placement EXAM: CT GUIDED DRAINAGE OF PELVIC ABSCESS MEDICATIONS: The patient is currently admitted to the hospital and receiving intravenous antibiotics. The antibiotics were administered within an appropriate time frame prior to the initiation of the procedure. ANESTHESIA/SEDATION: 1 mg IV Versed 50 mcg IV Fentanyl Moderate Sedation Time:  11  minutes The patient was continuously monitored during the procedure by the interventional radiology nurse under my direct supervision. COMPLICATIONS: None immediate. TECHNIQUE: Informed  written consent was obtained from the patient after a thorough discussion of the procedural risks, benefits and alternatives. All questions were addressed. Maximal Sterile Barrier Technique was utilized including caps, mask, sterile gowns, sterile gloves, sterile drape, hand hygiene and skin antiseptic. A timeout was performed prior to the initiation of the procedure. PROCEDURE: The operative field was prepped with Chlorhexidine in a sterile fashion, and a sterile drape was applied covering the operative field. A sterile gown and sterile gloves were used for the procedure. Local anesthesia was provided with 1% Lidocaine. Patient position prone on the procedure table. The right gluteal skin prepped and draped in usual fashion. Following local lidocaine administration, 18 gauge trocar needle was advanced into the pelvic fluid collection utilizing CT guidance. The needle was exchanged for 10.2 [place] multipurpose pigtail drain over 0.035 inch guidewire. Drain secured to skin with suture and connected to bulb. 50 mL sample was aspirated and sent for Gram stain and culture. IMPRESSION: 10.2 [place] multipurpose pigtail drain placed and pelvic fluid collection. Electronically Signed   By: Miachel Roux M.D.   On: 05/14/2021 12:40    Labs:  CBC: Recent Labs    05/10/21 0425 05/12/21 0329 05/13/21 0355 05/14/21 0400  WBC 15.0* 22.2* 22.3* 20.3*  HGB 14.1 13.5 12.8* 11.9*  HCT 41.5 39.9 37.5* 34.6*  PLT 260 317 298 263    COAGS: No results for input(s): INR, APTT in the last 8760 hours.  BMP: Recent Labs    05/11/21 0032 05/12/21 0329 05/13/21 0355 05/14/21 0400  NA 134* 132* 132* 132*  K 3.3* 3.7 3.7 3.8  CL 102 98 100 102  CO2 '25 25 23 25  '$ GLUCOSE 166* 100* 124* 143*  BUN '15 15 16 15  '$ CALCIUM 8.3* 8.0* 8.2* 8.1*  CREATININE 0.78 0.78 0.70 0.63  GFRNONAA >60 >60 >60 >60    LIVER FUNCTION TESTS: Recent Labs    05/02/21 1806 05/11/21 0032 05/12/21 0329  BILITOT 1.3* 0.4 0.6  AST '31  20 21  '$ ALT '20 21 19  '$ ALKPHOS 63 41 51  PROT 8.0 5.7* 5.5*  ALBUMIN 4.1 2.3* 2.0*    Assessment and Plan: 87 y.o. male s/p right inguinal hernia repair on 05/02/21 and small bowel resection on 8.12.22. CT abd from 05/13/21 showed  large rim enhancing fluid collection in the pelvis consistent with a pelvic abscess. S/p pelvic drain placement with Dr. Dwaine Gale on 05/13/21; afebrile; latest OP 55 cc, no new labs; drain fluid cx- klebsiella; cont drain irrigation/OP monitoring, lab checks; obtain f/u CT once OP minimal or if WBC trends upward; may need drain injection before removal   Electronically Signed: D. Rowe Robert, PA-C 05/15/2021, 9:16 AM   I spent a total of 15 Minutes at the the patient's bedside AND on the patient's hospital floor or unit, greater than 50% of which was counseling/coordinating care for pelvic abscess drain    Patient ID: Gary Rice, male   DOB: Jun 06, 1931, 87 y.o.   MRN: [id number]

## 2021-05-15 NOTE — Progress Notes (Signed)
No blood return from red lumen of PICC after 2 hour tPA dwell time. Dead in cap in place, tPA left for longer dwell. Recommend chest Xray to confirm tip placement. RN made aware.

## 2021-05-16 DIAGNOSIS — K460 Unspecified abdominal hernia with obstruction, without gangrene: Secondary | ICD-10-CM | POA: Diagnosis not present

## 2021-05-16 DIAGNOSIS — K567 Ileus, unspecified: Secondary | ICD-10-CM | POA: Diagnosis not present

## 2021-05-16 DIAGNOSIS — K651 Peritoneal abscess: Secondary | ICD-10-CM | POA: Diagnosis not present

## 2021-05-16 LAB — GLUCOSE, CAPILLARY
Glucose-Capillary: 167 mg/dL — ABNORMAL HIGH (ref 70–99)
Glucose-Capillary: 149 mg/dL — ABNORMAL HIGH (ref 70–99)
Glucose-Capillary: 158 mg/dL — ABNORMAL HIGH (ref 70–99)
Glucose-Capillary: 149 mg/dL — ABNORMAL HIGH (ref 70–99)
Glucose-Capillary: 172 mg/dL — ABNORMAL HIGH (ref 70–99)

## 2021-05-16 LAB — BASIC METABOLIC PANEL
Anion gap: 7 (ref 5–15)
BUN: 14 mg/dL (ref 8–23)
CO2: 21 mmol/L — ABNORMAL LOW (ref 22–32)
Calcium: 8.3 mg/dL — ABNORMAL LOW (ref 8.9–10.3)
Chloride: 103 mmol/L (ref 98–111)
Creatinine, Ser: 0.59 mg/dL — ABNORMAL LOW (ref 0.61–1.24)
GFR, Estimated: >60 mL/min (ref >60)
Glucose, Bld: 157 mg/dL — ABNORMAL HIGH (ref 70–99)
Potassium: 4 mmol/L (ref 3.5–5.1)
Sodium: 131 mmol/L — ABNORMAL LOW (ref 135–145)

## 2021-05-16 LAB — CBC
HCT: 35.8 % — ABNORMAL LOW (ref 39.0–52.0)
Hemoglobin: 12.2 g/dL — ABNORMAL LOW (ref 13.0–17.0)
MCH: 31.2 pg (ref 26.0–34.0)
MCHC: 34.1 g/dL (ref 30.0–36.0)
MCV: 91.6 fL (ref 80.0–100.0)
Platelets: 347 10*3/uL (ref 150–400)
RBC: 3.91 MIL/uL — ABNORMAL LOW (ref 4.22–5.81)
RDW: 13.2 % (ref 11.5–15.5)
WBC: 19.5 10*3/uL — ABNORMAL HIGH (ref 4.0–10.5)
nRBC: 0 % (ref 0.0–0.2)

## 2021-05-16 LAB — PHOSPHORUS: Phosphorus: 3 mg/dL (ref 2.5–4.6)

## 2021-05-16 LAB — MAGNESIUM: Magnesium: 2 mg/dL (ref 1.7–2.4)

## 2021-05-16 MED ORDER — TRAVASOL 10 % IV SOLN
INTRAVENOUS | Status: AC
  Filled 2021-05-16: qty 924

## 2021-05-16 MED ORDER — SODIUM CHLORIDE 0.9 % IV SOLN
1.0000 g | Freq: Three times a day (TID) | INTRAVENOUS | Status: DC
  Administered 2021-05-16 – 2021-05-26 (×30): 1 g via INTRAVENOUS
  Filled 2021-05-16 (×31): qty 1

## 2021-05-16 MED ORDER — ATORVASTATIN CALCIUM 10 MG PO TABS
10.0000 mg | ORAL_TABLET | Freq: Every day | ORAL | Status: DC
  Administered 2021-05-16 – 2021-05-20 (×5): 10 mg via ORAL
  Filled 2021-05-16 (×5): qty 1

## 2021-05-16 MED ORDER — DILTIAZEM HCL 60 MG PO TABS
120.0000 mg | ORAL_TABLET | Freq: Three times a day (TID) | ORAL | Status: DC
  Administered 2021-05-16 – 2021-05-26 (×31): 120 mg via ORAL
  Filled 2021-05-16 (×32): qty 2

## 2021-05-16 MED ORDER — ACETAMINOPHEN 325 MG PO TABS
650.0000 mg | ORAL_TABLET | Freq: Four times a day (QID) | ORAL | Status: DC
  Administered 2021-05-16: 650 mg via ORAL
  Administered 2021-05-19: 325 mg via ORAL
  Administered 2021-05-20 – 2021-05-24 (×3): 650 mg via ORAL
  Filled 2021-05-16 (×16): qty 2

## 2021-05-16 NOTE — Care Management Important Message (Signed)
Important Message  Patient Details  Name: Gary Rice MRN: [id number] Date of Birth: 30-Jul-1931   Medicare Important Message Given:  Yes     Shelda Altes 05/16/2021, 9:11 AM

## 2021-05-16 NOTE — Progress Notes (Signed)
7 Days Post-Op  Subjective: Still passing some flatus.  No further liquid stool.  Feels very weak.  Did ok with is clear liquids yesterday.  No nausea.   Objective: Vital signs in last 24 hours: Temp:  [98 F (36.7 C)-99.3 F (37.4 C)] 98 F (36.7 C) (08/19 0800) Pulse Rate:  [68-86] 86 (08/19 0800) Resp:  [16-18] 16 (08/19 0800) BP: (116-129)/(63-78) 120/78 (08/19 0800) SpO2:  [93 %-96 %] 95 % (08/19 0800) Last BM Date: 05/15/21  Intake/Output from previous day: 08/18 0701 - 08/19 0700 In: 2618.3 [P.O.:480; I.V.:2078.3; IV Piggyback:50] Out: 1150 [Urine:1050; Drains:100] Intake/Output this shift: No intake/output data recorded.  PE: Abd: Very soft with mild distension. He has mild tenderness around his wound and is otherwise NT. Midline wound with necrotic fibrin at base with further dehiscence of his wound and sutures pulling through, but no evisceration. Thin brown drainage still noted on dressing. RLQ incision is c/d/I. Staples in place c/d/I. +BS, Left inguinal hernia soft and still reducible   Lab Results:  Recent Labs    05/15/21 1027 05/16/21 0241  WBC 17.4* 19.5*  HGB 12.2* 12.2*  HCT 35.5* 35.8*  PLT 317 347   BMET Recent Labs    05/15/21 1027 05/16/21 0241  NA 133* 131*  K 4.0 4.0  CL 106 103  CO2 22 21*  GLUCOSE 134* 157*  BUN 14 14  CREATININE 0.58* 0.59*  CALCIUM 8.1* 8.3*   PT/INR No results for input(s): LABPROT, INR in the last 72 hours. CMP     Component Value Date/Time   NA 131 (L) 05/16/2021 0241   K 4.0 05/16/2021 0241   CL 103 05/16/2021 0241   CO2 21 (L) 05/16/2021 0241   GLUCOSE 157 (H) 05/16/2021 0241   BUN 14 05/16/2021 0241   CREATININE 0.59 (L) 05/16/2021 0241   CALCIUM 8.3 (L) 05/16/2021 0241   PROT 5.6 (L) 05/15/2021 1027   ALBUMIN 1.9 (L) 05/15/2021 1027   AST 78 (H) 05/15/2021 1027   ALT 104 (H) 05/15/2021 1027   ALKPHOS 99 05/15/2021 1027   BILITOT 0.8 05/15/2021 1027   GFRNONAA >60 05/16/2021 0241   GFRAA   08/14/2007 0312    >60        The eGFR has been calculated using the MDRD equation. This calculation has not been validated in all clinical   Lipase     Component Value Date/Time   LIPASE 27 05/02/2021 1806    Studies/Results: DG CHEST PORT 1 VIEW  Result Date: 05/15/2021 CLINICAL DATA:  PICC line complication. EXAM: PORTABLE CHEST 1 VIEW COMPARISON:  Chest x-ray dated May 11, 2021. FINDINGS: Slight interval retraction in the tip of the right upper extremity PICC line, though it remains within the mid SVC. Unchanged enteric tube. Stable cardiomediastinal silhouette. No focal consolidation, pleural effusion, or pneumothorax. No acute osseous abnormality. IMPRESSION: 1. Slight interval retraction of the right upper extremity PICC line, though it remains within the mid SVC. 2. No active disease. Electronically Signed   By: Titus Dubin M.D.   On: 05/15/2021 08:58    Anti-infectives: Anti-infectives (From admission, onward)    Start     Dose/Rate Route Frequency Ordered Stop   05/13/21 1515  piperacillin-tazobactam (ZOSYN) IVPB 3.375 g        3.375 g 12.5 mL/hr over 240 Minutes Intravenous Every 8 hours 05/13/21 1421     05/09/21 0915  ceFAZolin (ANCEF) IVPB 2g/100 mL premix  2 g 200 mL/hr over 30 Minutes Intravenous On call to O.R. 05/09/21 0825 05/09/21 1050        Assessment/Plan POD 14, s/p open right inguinal hernia repair with mesh by Dr. Bobbye Morton for incarcerated RIH with persistent ileus vs SBO POD 7, s/p dx lap with mini laparotomy with SBR, Dr. Donne Hazel 05/09/21 - adv to FLD - TID WD dressing changes to midline wound.  - S/p IR drainage 8/16 for IA fluid collection. Cx's w/ Klebsiella Oxytoca. WBC back up slightly to 19K today from 17K.  On zosyn, D 3/7.  Continue to monitor WBC - IS/pulm toilet - Mobilize TID in halls - Cont TNA until tolerating some orals - Will need staples out early next week  FEN - TNA/FLD/IVF per TRH VTE - SCDs, Lovenox ID - none    New onset a fib Ascending thoracic aneurysm HLD   LOS: 14 days    Henreitta Cea , [facility] Surgery 05/16/2021, 8:47 AM Please see Amion for pager number during day hours 7:00am-4:30pm

## 2021-05-16 NOTE — Progress Notes (Signed)
PHARMACY - TOTAL PARENTERAL NUTRITION CONSULT NOTE   Indication: Prolonged ileus, SBO  Patient Measurements: Height: '5\' 11"'$  (180.3 cm) Weight: 77.1 kg (170 lb) IBW/kg (Calculated) : 75.3 TPN AdjBW (KG): 77.1 Body mass index is 23.71 kg/m. Usual Weight:    Assessment: Sent by OP MD 8/5 for r/o SBO. New onset afib (CHA2DS2-VASc 2) . CT showing findings consistent with mechanical SBO with transition point related to right inguinal hernia; no perforation. Has been unable to tolerate orals. Start TPN 8/14.   PMH: SVT, hyperlipidemia, appendectomy, bilateral inguinal hernias, previous hernia repair  Glucose / Insulin: CBGs 130-170s, 8 units insulin given with TPN Electrolytes:  Na 131, K 4, Phos 3, Mg 2 (goal K+>=4, Mg >=2), CoCa wnl Renal: Scr <1 Hepatic: LFT's slightly elevated - monitor. Albumin 1.9. TG 86 Intake / Output; MIVF: UOP 0.6 ml/kg/hr, NG removed, Drain 450>55>100 mL out, LBM 8/18  LR '@KVO'$   GI Meds: IV PPI/24h  GI Imaging: - 8/5: CT showing findings consistent with mechanical SBO with transition point related to right inguinal hernia; no perforation.  Moderate fat and small bowel containing left inguinal hernia without adverse features.  - 8/8: 8/8: Persistent small bowel obstruction, with transition point in the left lower quadrant anteriorly, possibly due to adhesions.  GI Surgeries / Procedures:  8/6:  incarcerated RIGHT inguinal hernia repair with mesh 8/12:  diagnostic lap due to ongoing LLQ ischemia and microperforation requiring resection. Open small bowel resection with primary anastomosis 8/16: IR drain pelvic abscess   Central access: PICC 8/14 TPN start date: 8/14  Nutritional Goals (per RD recommendation on 8/15): kCal: 1850-2050, Protein: 85-100g, Fluid:  >1.85L Goal TPN rate is  70 mL/hr (provides 92 g of protein and  1851 kcals per day)  Current Nutrition:  TPN CLD started 8/18 - took one supplement [facility]) advancing to a FLD on  8/19  Plan:  Continue TPN at 70 mL/hr at 1800  Electrolytes in TPN: Na 59mq/L, K 482m/L, Ca 71m72mL, Inc Mg 58m20m, and Phos 8mmo48m. Cl:Ac 1:1 Add standard MVI and trace elements to TPN Continue Sensitive q4h SSI and adjust as needed    CathyAlanda SlimrmD, FCCM [facility] Pharmacist Please see AMION for all Pharmacists' Contact Phone Numbers 05/16/2021, 8:05 AM

## 2021-05-16 NOTE — Progress Notes (Signed)
PROGRESS NOTE    Gary Rice [facility]  AGT:[id number] DOB: [birth year] DOA: 05/02/2021 PCP: Josetta Huddle, MD   Brief Narrative: Gary Rice is a 87 y.o. male with history of SVT, hyperlipidemia, bilateral inguinal hernias.  Patient presented secondary to vomiting and was found to have new onset atrial fibrillation with RVR in addition to mechanical SBO with transition point and associated incarcerated right inguinal hernia.  Patient underwent urgent repair with mesh placement in addition to subsequent diagnostic laparoscopy with small bowel resection and primary anastomosis.  During hospitalization, patient developed evidence of intra-abdominal infection and CT abdomen/pelvis confirmed a large pelvic abscess.  Interventional radiology was consulted for percutaneous drain placement which was performed on 8/16.   Assessment & Plan:   Principal Problem:   Atrial fibrillation with rapid ventricular response (HCC) Active Problems:   SBO (small bowel obstruction) (HCC)   Leukocytosis   Hyperlipidemia   Thoracic aortic aneurysm (HCC)   Malnutrition of moderate degree   Pelvic abscess in male [facility])   SBO with incarcerated right inguinal hernia General surgery consulted. Patient underwent urgent repair of incarcerated hernia with mesh placement on 8/6. Diagnostic laparoscopy performed on 8/12 secondary to ongoing LLQ ischemia/microperforation and underwent open small bowel resection with primary anastomosis. NG tube in place from 8/6 and now discontinued. TPN started on 8/14. -General surgery recommendations: Full liquid diet  Intraabdominal abscess CT abdomen/pelvis on 8/16 significant for a 12 x 11 x 10 cm consistent with a pelvic abscess. IR consulted and performed CT guided drain placement on 8/16. Wound culture (8/16) significant for Klebsiella oxytoca. -Continue Zosyn IV -Follow up final blood culture results  Intractable nausea/vomiting In setting of above problems. NG tube  placed with intermittent suction.   New onset atrial fibrillation with RVR In setting of above problems. Cardiology consulted. Patient stated on Cardizem IV in addition to metoprolol IV prn. Transthoracic Echocardiogram significant for normal EF. Patient is on Cardizem prn for rapid heart beat. -Switch to diltiazem 120 mg TID, continue drip until PO diltiazem started -Will discuss with general surgery on when okay to switch to full dose anticoagulation with Eliquis  Hyperlipidemia Patient is on Lipitor as an outpatient. Held since no NPO -Resume Lipitor  Ascending thoracic aortic aneurysm Seen on CT imaging. 4.8 cm dilation. Recommendation for semiannual image follow-up with CTA or MRA, in addition to CT surgery follow-up.  Leukocytosis Initially secondary to SBO with incarcerated with eventual improvement. Recurrent leukocytosis in setting of intraabdominal abscess; trended down slightly after drain placement.   DVT prophylaxis: Lovenox Code Status:   Code Status: Full Code Family Communication: None at bedside Disposition Plan: Discharge to home versus SNF pending ability to tolerate PO intake, wean TPN, transition to oral antibiotics. Likely ready for discharge in several days (3+)   Consultants:  General surgery Interventional radiology  Procedures:  OPEN RIGHT INGUINAL HERNIA REPAIR WITH MESH (05/03/2021)  TRANSTHORACIC ECHOCARDIOGRAM (05/04/2021) IMPRESSIONS     1. Left ventricular ejection fraction, by estimation, is 60 to 65%. The  left ventricle has normal function. The left ventricle has no regional  wall motion abnormalities. There is mild left ventricular hypertrophy.  Left ventricular diastolic parameters  are indeterminate.   2. Right ventricular systolic function is normal. The right ventricular  size is normal. Tricuspid regurgitation signal is inadequate for assessing  PA pressure.   3. The mitral valve is degenerative. Trivial mitral valve regurgitation.  No  evidence of mitral stenosis.   4. The aortic valve is grossly  normal. There is mild thickening of the  aortic valve. Aortic valve regurgitation is trivial. No aortic stenosis is  present.   5. The inferior vena cava is dilated in size with >50% respiratory  variability, suggesting right atrial pressure of 8 mmHg.  DIAGNOSTIC LAPAROSCOPY/OPEN SMALL BOWEL RESECTION WITH PRIMARY ANASTOMOSIS (05/09/2021)  Antimicrobials: Zosyn IV    Subjective: No abdominal pain. Passing gas and having some small bowel movements.  Objective: Vitals:   05/15/21 2026 05/15/21 2335 05/16/21 0345 05/16/21 0800  BP: 116/67 129/76 127/64 120/78  Pulse: 77 81 77 86  Resp: _0 Temp: 98.6 F (37 C) 98.4 F (36.9 C) 99.3 F (37.4 C) 98 F (36.7 C)  TempSrc:  Oral Oral Oral  SpO2: 94% 93% 95% 95%  Weight:      Height:        Intake/Output Summary (Last 24 hours) at 05/16/2021 0926 Last data filed at 05/16/2021 0345 Gross per 24 hour  Intake 2613.28 ml  Output 820 ml  Net 1793.28 ml    Filed Weights   05/02/21 1805  Weight: 77.1 kg    Examination:  General exam: Appears calm and comfortable Respiratory system: Clear to auscultation. Respiratory effort normal. Cardiovascular system: S1 & S2 heard, irregular rhythm with normal rate. No murmurs, rubs, gallops or clicks. Gastrointestinal system: Abdomen is mildly distended, soft and nontender. No organomegaly or masses felt. Normal bowel sounds heard. Central nervous system: Alert and oriented. No focal neurological deficits. Musculoskeletal: No edema. No calf tenderness Skin: No cyanosis. No rashes Psychiatry: Judgement and insight appear normal. Mood & affect appropriate.    Data Reviewed: I have personally reviewed following labs and imaging studies  CBC Lab Results  Component Value Date   WBC 19.5 (H) 05/16/2021   RBC 3.91 (L) 05/16/2021   HGB 12.2 (L) 05/16/2021   HCT 35.8 (L) 05/16/2021   MCV 91.6 05/16/2021   MCH 31.2  05/16/2021   PLT 347 05/16/2021   MCHC 34.1 05/16/2021   RDW 13.2 05/16/2021   LYMPHSABS 2.2 05/02/2021   MONOABS 2.0 (H) 05/02/2021   EOSABS 0.0 05/02/2021   BASOSABS 0.0 89/37/3428     Last metabolic panel Lab Results  Component Value Date   NA 131 (L) 05/16/2021   K 4.0 05/16/2021   CL 103 05/16/2021   CO2 21 (L) 05/16/2021   BUN 14 05/16/2021   CREATININE 0.59 (L) 05/16/2021   GLUCOSE 157 (H) 05/16/2021   GFRNONAA >60 05/16/2021   GFRAA  08/14/2007    >60        The eGFR has been calculated using the MDRD equation. This calculation has not been validated in all clinical   CALCIUM 8.3 (L) 05/16/2021   PHOS 3.0 05/16/2021   PROT 5.6 (L) 05/15/2021   ALBUMIN 1.9 (L) 05/15/2021   BILITOT 0.8 05/15/2021   ALKPHOS 99 05/15/2021   AST 78 (H) 05/15/2021   ALT 104 (H) 05/15/2021   ANIONGAP 7 05/16/2021    CBG (last 3)  Recent Labs    05/15/21 2337 05/16/21 0348 05/16/21 0819  GLUCAP 157* 167* 149*      GFR: Estimated Creatinine Clearance: 68 mL/min (A) (by C-G formula based on SCr of 0.59 mg/dL (L)).  Coagulation Profile: No results for input(s): INR, PROTIME in the last 168 hours.  Recent Results (from the past 240 hour(s))  Aerobic/Anaerobic Culture w Gram Stain (surgical/deep wound)     Status: None (Preliminary result)   Collection Time: 05/13/21  4:49 PM   Specimen: Fluid; Abscess  Result Value Ref Range Status   Specimen Description FLUID PELVIS  Final   Special Requests NONE  Final   Gram Stain   Final    NO SQUAMOUS EPITHELIAL CELLS SEEN RARE WBC SEEN ABUNDANT GRAM NEGATIVE RODS FEW GRAM VARIABLE ROD    Culture   Final    MODERATE KLEBSIELLA OXYTOCA CULTURE REINCUBATED FOR BETTER GROWTH Performed at [facility], [place] [address] [place], [place] [address]    Report Status PENDING  Incomplete         Radiology Studies: DG CHEST PORT 1 VIEW  Result Date: 05/15/2021 CLINICAL DATA:  PICC line complication. EXAM: PORTABLE CHEST 1  VIEW COMPARISON:  Chest x-ray dated May 11, 2021. FINDINGS: Slight interval retraction in the tip of the right upper extremity PICC line, though it remains within the mid SVC. Unchanged enteric tube. Stable cardiomediastinal silhouette. No focal consolidation, pleural effusion, or pneumothorax. No acute osseous abnormality. IMPRESSION: 1. Slight interval retraction of the right upper extremity PICC line, though it remains within the mid SVC. 2. No active disease. Electronically Signed   By: Titus Dubin M.D.   On: 05/15/2021 08:58        Scheduled Meds:  acetaminophen  650 mg Oral Q6H   Chlorhexidine Gluconate Cloth  6 each Topical Daily   enoxaparin (LOVENOX) injection  40 mg Subcutaneous Q24H   feeding supplement  1 Container Oral TID BM   insulin aspart  0-9 Units Subcutaneous Q4H   pantoprazole (PROTONIX) IV  40 mg Intravenous Q24H   sodium chloride flush  10-40 mL Intracatheter Q12H   sodium chloride flush  5 mL Intracatheter Q8H   Continuous Infusions:  diltiazem (CARDIZEM) infusion 15 mg/hr (05/16/21 0534)   lactated ringers 10 mL/hr at 05/15/21 1500   methocarbamol (ROBAXIN) IV     piperacillin-tazobactam (ZOSYN)  IV 3.375 g (05/16/21 0536)   promethazine (PHENERGAN) injection (IM or IVPB) 70 mL/hr at 05/04/21 0230   TPN ADULT (ION) 70 mL/hr at 05/15/21 1756   TPN ADULT (ION)       LOS: 14 days     Cordelia Poche, MD [facility] 05/16/2021, 9:26 AM  If 7PM-7AM, please contact night-coverage [email or web address]

## 2021-05-16 NOTE — Progress Notes (Signed)
Pharmacy Antibiotic Note  Avron Mayo is a 87 y.o. male admitted on 05/02/2021 with  intraabdominal infection .  Pharmacy has been consulted for meropenem dosing.  Pt with abscess growing moderate klebsiella, bacteroides fragilis, beta-lactamase positive.  Plan: Meropenem 1g IV q 8 hrs.  Height: '5\' 11"'$  (180.3 cm) Weight: 77.1 kg (170 lb) IBW/kg (Calculated) : 75.3  Temp (24hrs), Avg:98.6 F (37 C), Min:98 F (36.7 C), Max:99.3 F (37.4 C)  Recent Labs  Lab 05/12/21 0329 05/13/21 0355 05/14/21 0400 05/15/21 1027 05/16/21 0241  WBC 22.2* 22.3* 20.3* 17.4* 19.5*  CREATININE 0.78 0.70 0.63 0.58* 0.59*    Estimated Creatinine Clearance: 68 mL/min (A) (by C-G formula based on SCr of 0.59 mg/dL (L)).    No Known Allergies   Thank you for allowing pharmacy to be a part of this patient's care.  [place] Crane, Roylene Reason, BCCP Clinical Pharmacist  05/16/2021 5:02 PM   [facility] pharmacy phone numbers are listed on [email or web address]

## 2021-05-16 NOTE — Consult Note (Signed)
[name] for Infectious Disease    Date of Admission:  05/02/2021     Reason for Consult: intra-abdominal abscess     Referring Physician: Dr Lonny Prude  Current antibiotics: Meropenem 8/19-present  Previous antibiotics: Pip Tazo 8/16-8/19 Cefazolin 8/5, 8/12 x 1 dose each   ASSESSMENT:    87 year old man who presented with incarcerated right inguinal hernia status post repair with mesh 06/05/9891 complicated by ongoing left lower quadrant ischemia and microperforation status post open small bowel resection with primary anastomosis and repair of microperforation 05/09/2021.  Now with large intra-abdominal abscess status post IR drain placement 05/13/2021 and CT also noted postoperative changes in the left lower quadrant from recent small bowel resection with anastomosis sutures and small surrounding rim-enhancing fluid collections consistent with abscesses.  Cultures from IR drain placement with MDR Klebsiella and Bacteroides fragilis.  PLAN:    Stop Zosyn Start meropenem Continue drain per IR.  Will need follow-up imaging once drain output subsides in order to determine resolution of intra-abdominal abscesses Wound care Will follow.  Dr. Gale Journey available as needed over the weekend.   Principal Problem:   Pelvic abscess in male [facility]) Active Problems:   Atrial fibrillation with rapid ventricular response (HCC)   SBO (small bowel obstruction) (HCC)   Leukocytosis   Hyperlipidemia   Thoracic aortic aneurysm (HCC)   Malnutrition of moderate degree   MEDICATIONS:    Scheduled Meds:  acetaminophen  650 mg Oral Q6H   atorvastatin  10 mg Oral QHS   Chlorhexidine Gluconate Cloth  6 each Topical Daily   diltiazem  120 mg Oral Q8H   enoxaparin (LOVENOX) injection  40 mg Subcutaneous Q24H   feeding supplement  1 Container Oral TID BM   insulin aspart  0-9 Units Subcutaneous Q4H   pantoprazole (PROTONIX) IV  40 mg Intravenous Q24H   sodium chloride flush  10-40 mL Intracatheter  Q12H   sodium chloride flush  5 mL Intracatheter Q8H   Continuous Infusions:  lactated ringers 10 mL/hr at 05/15/21 1500   meropenem (MERREM) IV     methocarbamol (ROBAXIN) IV     promethazine (PHENERGAN) injection (IM or IVPB) 70 mL/hr at 05/04/21 0230   TPN ADULT (ION) 70 mL/hr at 05/15/21 1756   TPN ADULT (ION) 70 mL/hr at 05/16/21 1715   PRN Meds:.lip balm, methocarbamol (ROBAXIN) IV, metoprolol tartrate, morphine injection, ondansetron (ZOFRAN) IV, phenol, promethazine (PHENERGAN) injection (IM or IVPB), sodium chloride flush  HPI:    Gary Rice is a 87 y.o. male with a past medical history of SVT, hyperlipidemia, and bilateral inguinal hernias who presented to the hospital 13 days ago with vomiting and was found to be in new onset atrial fibrillation with RVR.  He was also noted to have small bowel obstruction with transition point and associated incarcerated right inguinal hernia.  He underwent urgent repair with mesh placement on 05/03/2021.  Unfortunately, he had had further abdominal surgery with a diagnostic laparoscopic performed 8/12 due to ongoing left lower quadrant ischemia/microperforation and underwent open small bowel resection with primary anastomosis.  He was noted to have continued abdominal pain and leukocytosis following this surgery so he underwent CT abdomen/pelvis 8/16 which noted a large 12 x 11 x 10 cm fluid collection consistent with a pelvic abscess.  CT also noted postoperative changes in the left lower quadrant for recent small bowel resection with anastomosis sutures and small surrounding rim-enhancing fluid collections consistent with small abscesses at that area as well.  He  was placed on piperacillin tazobactam and IR was consulted for CT-guided drain placement which was done on 8/16.  Cultures from this have grown Klebsiella and Bacteroides.  The Klebsiella is resistant to Zosyn as well as Unasyn, Ancef, and ceftriaxone.  The last 24 hours his drain has put  out approximately 100 cc of fluid.  We have been consulted for further antibiotic assistance.   History reviewed. No pertinent past medical history.  Social History   Tobacco Use   Smoking status: Former    Types: Cigarettes    Quit date: 10/24/1950    Years since quitting: 70.6   Smokeless tobacco: Never  Vaping Use   Vaping Use: Never used  Substance Use Topics   Alcohol use: No   Drug use: No    Family History  Problem Relation Age of Onset   Heart disease Father    Heart attack Father    Heart attack Son    Sleep apnea Son     No Known Allergies  Review of Systems  Unable to perform ROS: Other patient was walking the halls with the assistance of PT/OT  OBJECTIVE:   Blood pressure 125/71, pulse 85, temperature 98.9 F (37.2 C), temperature source Oral, resp. rate (!) 24, height 5' 11"  (1.803 m), weight 77.1 kg, SpO2 95 %. Body mass index is 23.71 kg/m.  Physical Exam Constitutional:      General: He is not in acute distress.    Appearance: Normal appearance.     Comments: Patient ambulating the halls with therapy and the assistance of a walker.  HENT:     Head: Normocephalic and atraumatic.  Eyes:     Extraocular Movements: Extraocular movements intact.     Conjunctiva/sclera: Conjunctivae normal.  Pulmonary:     Effort: Pulmonary effort is normal. No respiratory distress.  Musculoskeletal:     Cervical back: Normal range of motion and neck supple.     Right lower leg: No edema.     Left lower leg: No edema.  Skin:    General: Skin is warm and dry.     Findings: No rash.  Neurological:     General: No focal deficit present.     Mental Status: He is alert and oriented to person, place, and time.  Psychiatric:        Mood and Affect: Mood normal.        Behavior: Behavior normal.     Lab Results: Lab Results  Component Value Date   WBC 19.5 (H) 05/16/2021   HGB 12.2 (L) 05/16/2021   HCT 35.8 (L) 05/16/2021   MCV 91.6 05/16/2021   PLT 347  05/16/2021    Lab Results  Component Value Date   NA 131 (L) 05/16/2021   K 4.0 05/16/2021   CO2 21 (L) 05/16/2021   GLUCOSE 157 (H) 05/16/2021   BUN 14 05/16/2021   CREATININE 0.59 (L) 05/16/2021   CALCIUM 8.3 (L) 05/16/2021   GFRNONAA >60 05/16/2021   GFRAA  08/14/2007    >60        The eGFR has been calculated using the MDRD equation. This calculation has not been validated in all clinical    Lab Results  Component Value Date   ALT 104 (H) 05/15/2021   AST 78 (H) 05/15/2021   ALKPHOS 99 05/15/2021   BILITOT 0.8 05/15/2021    No results found for: CRP  No results found for: ESRSEDRATE  I have reviewed the micro and lab results in Epic.  Imaging: DG CHEST PORT 1 VIEW  Result Date: 05/15/2021 CLINICAL DATA:  PICC line complication. EXAM: PORTABLE CHEST 1 VIEW COMPARISON:  Chest x-ray dated May 11, 2021. FINDINGS: Slight interval retraction in the tip of the right upper extremity PICC line, though it remains within the mid SVC. Unchanged enteric tube. Stable cardiomediastinal silhouette. No focal consolidation, pleural effusion, or pneumothorax. No acute osseous abnormality. IMPRESSION: 1. Slight interval retraction of the right upper extremity PICC line, though it remains within the mid SVC. 2. No active disease. Electronically Signed   By: Titus Dubin M.D.   On: 05/15/2021 08:58     Imaging  independently reviewed in Epic.  Raynelle [facility] for Infectious Disease [facility] [phone] pager 05/16/2021, 5:26 PM

## 2021-05-16 NOTE — Progress Notes (Signed)
Occupational Therapy Treatment Patient Details Name: Gary Rice MRN: [id number] DOB: August 07, 1931 Today's Date: 05/16/2021    History of present illness 87 yo male admitted 8/522 after being referred to ED by PCP secondary to incarcerated inguinal hernia causing SBO, also found to be in Afib with RVR in the ED. Received surgical repair of incarcerated inguinal hernia 05/02/21. NGT placed to wall suction 8/6. PMH hernia repair joint replacement. Drain placement yesterday 05/13/21   OT comments  Pt continued to demonstrate motivation to work with OT this session and get OOB. Pt overall continues to be at min guard level with ADL's and functional mobility. Activity tolerance is the biggest difficulty per pt, reporting that after therapy sessions he becomes very exhausted and needs a rest. OT will continue to follow up acutely.    Follow Up Recommendations  Home health OT;Supervision - Intermittent    Equipment Recommendations  Other (comment) (RW)    Recommendations for Other Services      Precautions / Restrictions Precautions Precautions: Fall Restrictions Weight Bearing Restrictions: No       Mobility Bed Mobility Overal bed mobility: Needs Assistance Bed Mobility: Supine to Sit;Sit to Supine     Supine to sit: Supervision Sit to supine: Supervision;HOB elevated   General bed mobility comments: Sup for increased time, use of rails, and safety    Transfers Overall transfer level: Needs assistance Equipment used: Rolling walker (2 wheeled) Transfers: Sit to/from Stand Sit to Stand: Min guard         General transfer comment: Assist for safety and lines. Pt uses momentum to assist with rise to stand.    Balance Overall balance assessment: Needs assistance Sitting-balance support: Feet supported Sitting balance-Leahy Scale: Normal     Standing balance support: Bilateral upper extremity supported;During functional activity Standing balance-Leahy Scale:  Fair Standing balance comment: Reliant on B UE support for steadying, min guard                           ADL either performed or assessed with clinical judgement   ADL Overall ADL's : Needs assistance/impaired     Grooming: Wash/dry hands;Wash/dry face;Min guard;Standing Grooming Details (indicate cue type and reason): completed at sink     Lower Body Bathing: Minimal assistance;Sitting/lateral leans;Sit to/from stand Lower Body Bathing Details (indicate cue type and reason): Min A for throroughness due to safety and pt fatigue after ambulating in hallway         Toilet Transfer: Min guard;Ambulation Toilet Transfer Details (indicate cue type and reason): Pt in hallway, ambulated back to room and wanted to use BSC this session         Functional mobility during ADLs: Min guard;Rolling walker General ADL Comments: Pt wanted to complete mobility in the hallway, then needed to complete toileting once back in the room, at overall min guard-min A level     Vision       Perception     Praxis      Cognition Arousal/Alertness: Awake/alert Behavior During Therapy: WFL for tasks assessed/performed Overall Cognitive Status: Within Functional Limits for tasks assessed                                          Exercises     Shoulder Instructions       General Comments VSS on RA  Pertinent Vitals/ Pain       Pain Assessment: No/denies pain  Home Living                                          Prior Functioning/Environment              Frequency  Min 2X/week        Progress Toward Goals  OT Goals(current goals can now be found in the care plan section)  Progress towards OT goals: Progressing toward goals  Acute Rehab OT Goals Patient Stated Goal: "I want more intensive therapy" OT Goal Formulation: With patient Time For Goal Achievement: 05/18/21 Potential to Achieve Goals: Good ADL Goals Pt Will  Perform Grooming: with modified independence;standing Pt Will Transfer to Toilet: with modified independence;ambulating Pt Will Perform Toileting - Clothing Manipulation and hygiene: with modified independence;sitting/lateral leans;sit to/from stand Pt/caregiver will Perform Home Exercise Program: Increased strength;Both right and left upper extremity;Independently  Plan Discharge plan remains appropriate;Frequency remains appropriate    Co-evaluation                 AM-PAC OT "6 Clicks" Daily Activity     Outcome Measure   Help from another person eating meals?: None Help from another person taking care of personal grooming?: A Little Help from another person toileting, which includes using toliet, bedpan, or urinal?: A Little Help from another person bathing (including washing, rinsing, drying)?: A Little Help from another person to put on and taking off regular upper body clothing?: A Little Help from another person to put on and taking off regular lower body clothing?: A Little 6 Click Score: 19    End of Session Equipment Utilized During Treatment: Rolling walker;Gait belt  OT Visit Diagnosis: Unsteadiness on feet (R26.81);Other abnormalities of gait and mobility (R26.89);Muscle weakness (generalized) (M62.81)   Activity Tolerance Patient tolerated treatment well   Patient Left in bed;with call bell/phone within reach   Nurse Communication Mobility status        Time: [id number] OT Time Calculation (min): 40 min  Charges: OT General Charges $OT Visit: 1 Visit OT Treatments $Self Care/Home Management : 8-22 mins $Therapeutic Activity: 23-37 mins  Oaklyn Mans H., OTR/L Acute Rehabilitation  Amorita Vanrossum Elane Aliviya Schoeller 05/16/2021, 6:14 PM

## 2021-05-16 NOTE — Progress Notes (Signed)
Referring Physician(s): Saverio Danker PA-C   Supervising Physician: Sandi Mariscal  Patient Status:  [facility] - In-pt  Chief Complaint:  S/p pelvic abscess drain placement with Dr. Dwaine Gale on 05/14/21   Subjective:  Patient laying in bed, not in acute distress. States that he is doing so-so today, states that he is irritated by feeling weak and not able to get up and move around like he was able to before hospitalization. Denies chills, fever, abdominal pain, nausea, vomiting.  Allergies: Patient has no known allergies.  Medications: Prior to Admission medications   Medication Sig Start Date End Date Taking? Authorizing Provider  aspirin EC 81 MG tablet Take 81 mg by mouth at bedtime.   Yes [provider]  atorvastatin (LIPITOR) 10 MG tablet Take 10 mg by mouth at bedtime. 08/14/15  Yes [provider]  diltiazem (CARDIZEM) 30 MG tablet TAKE 1 TABLET AS NEEDED FOR RAPID HEART BEAT Patient taking differently: Take 30 mg by mouth daily as needed (rapid heart beat). 10/06/17  Yes Isaiah Serge, NP  fluticasone (FLONASE) 50 MCG/ACT nasal spray Place 1 spray into both nostrils daily. 08/14/15  Yes [provider]  Multiple Vitamin (MULTIVITAMIN WITH MINERALS) TABS tablet Take 1 tablet by mouth daily.   Yes [provider]  Multiple Vitamins-Minerals (PRESERVISION AREDS 2) CAPS Take 1 capsule 2 (two) times daily by mouth.   Yes [provider]  VITAMIN E PO Take 1 capsule by mouth daily.    Yes [provider]     Vital Signs: BP 120/78 (BP Location: Left Arm)   Pulse 86   Temp 98 F (36.7 C) (Oral)   Resp 16   Ht '5\' 11"'$  (1.803 m)   Wt 170 lb (77.1 kg)   SpO2 95%   BMI 23.71 kg/m   Physical Exam Vitals reviewed.  Constitutional:      General: He is not in acute distress.    Appearance: He is not ill-appearing.  HENT:     Head: Normocephalic and atraumatic.  Pulmonary:     Effort: Pulmonary effort is normal.  Abdominal:      General: Abdomen is flat.     Palpations: Abdomen is soft.  Musculoskeletal:     Cervical back: Neck supple.  Skin:    General: Skin is warm and dry.     Coloration: Skin is not jaundiced or pale.     Comments: Positive R TG drain to suction bulb. Site is unremarkable with no erythema, edema, tenderness, bleeding or drainage. Suture and stat lock in place. Dressing is clean, dry, and intact. 20 ml of  hazy pink colored fluid noted in the bulb. Drain aspirates and flushes well.    Neurological:     Mental Status: He is alert and oriented to person, place, and time.  Psychiatric:        Mood and Affect: Mood normal.        Behavior: Behavior normal.    Imaging: CT ABDOMEN PELVIS W CONTRAST  Result Date: 05/13/2021 CLINICAL DATA:  History of right inguinal hernia repair and small-bowel resection. Leukocytosis. EXAM: CT ABDOMEN AND PELVIS WITH CONTRAST TECHNIQUE: Multidetector CT imaging of the abdomen and pelvis was performed using the standard protocol following bolus administration of intravenous contrast. CONTRAST:  18m OMNIPAQUE IOHEXOL 350 MG/ML SOLN COMPARISON:  CT scan 05/05/2021 FINDINGS: Lower chest: Small bilateral pleural effusions, right larger than left with overlying atelectasis. No definite infiltrates or pneumothorax. Stable 4 mm left lower lobe  pulmonary nodule. The heart is normal in size. No pericardial effusion. There is an NG tube coursing down the esophagus and into the stomach. Stable aortic and coronary artery calcifications. Hepatobiliary: A few small scattered hepatic cysts are again noted. No worrisome hepatic lesions or intrahepatic biliary dilatation. Gallbladder demonstrates layering gallstones but no findings for acute cholecystitis. No common bile duct dilatation. Pancreas: No mass, inflammation or ductal dilatation. Spleen: Normal size.  No focal lesions. Adrenals/Urinary Tract: The adrenal glands unremarkable stable. Stable renal cysts. No worrisome renal lesions  or hydronephrosis. There is gas in the bladder are likely from recent catheterization. Stomach/Bowel: The stomach is unremarkable. It contains an NG tube. The duodenum is unremarkable. Slightly distended small bowel loops but no obstructive findings. Contrast gets all the way to the colon. Postoperative changes in the left lower quadrant with anastomosis sutures. There is surrounding small enhancing fluid collections and significant inflammatory type changes. There is also a small amount of free air and. There is a slightly larger interloop rim enhancing fluid collection measuring 3 cm on image number 58/5 which contains a small amount of gas. This is likely a small abscess. There is a large rim enhancing fluid collection in the pelvis measuring approximately 12 x 11 x 10 cm and is located mainly between the bladder and the rectum. I do not see any gas in this fluid collection but it is consistent with a pelvic abscess. Vascular/Lymphatic: Stable vascular calcifications. Scattered mesenteric and retroperitoneal lymph nodes but no mass or overt adenopathy the. Reproductive: The prostate gland and seminal vesicles are grossly normal. Other: Surgical changes from recent right inguinal hernia repair with small amount fluid and gas. The large left inguinal hernia is stable. It contains small bowel loops but no findings for incarceration or obstruction. Musculoskeletal: Stable scoliosis and advanced degenerative lumbar spondylosis. No acute bony findings. IMPRESSION: 1. Postoperative changes in the left lower quadrant from recent small bowel resection with anastomosis sutures. Small surrounding rim enhancing fluid collections and marked inflammatory changes consistent with small abscesses. No leaking oral contrast is demonstrated. 2. Large rim enhancing fluid collection in the pelvis measuring approximately 12 x 11 x 10 cm consistent with a pelvic abscess. 3. Small bilateral pleural effusions, right larger than left with  overlying atelectasis. 4. Cholelithiasis. 5. Stable large left inguinal hernia containing small bowel loops but no findings for incarceration or obstruction. 6. Stable 4 mm left lower lobe pulmonary nodule. These results will be called to the ordering clinician or representative by the Radiologist Assistant, and communication documented in the PACS or [employer]. Aortic Atherosclerosis (ICD10-I70.0). Electronically Signed   By: Marijo Sanes M.D.   On: 05/13/2021 14:04   DG CHEST PORT 1 VIEW  Result Date: 05/15/2021 CLINICAL DATA:  PICC line complication. EXAM: PORTABLE CHEST 1 VIEW COMPARISON:  Chest x-ray dated May 11, 2021. FINDINGS: Slight interval retraction in the tip of the right upper extremity PICC line, though it remains within the mid SVC. Unchanged enteric tube. Stable cardiomediastinal silhouette. No focal consolidation, pleural effusion, or pneumothorax. No acute osseous abnormality. IMPRESSION: 1. Slight interval retraction of the right upper extremity PICC line, though it remains within the mid SVC. 2. No active disease. Electronically Signed   By: Titus Dubin M.D.   On: 05/15/2021 08:58   CT IMAGE GUIDED DRAINAGE BY PERCUTANEOUS CATHETER  Result Date: 05/14/2021 INDICATION: 87 year old gentleman with rim enhancing pelvic fluid collection presents to IR for drain placement EXAM: CT GUIDED DRAINAGE OF PELVIC ABSCESS MEDICATIONS:  The patient is currently admitted to the hospital and receiving intravenous antibiotics. The antibiotics were administered within an appropriate time frame prior to the initiation of the procedure. ANESTHESIA/SEDATION: 1 mg IV Versed 50 mcg IV Fentanyl Moderate Sedation Time:  11 minutes The patient was continuously monitored during the procedure by the interventional radiology nurse under my direct supervision. COMPLICATIONS: None immediate. TECHNIQUE: Informed written consent was obtained from the patient after a thorough discussion of the procedural  risks, benefits and alternatives. All questions were addressed. Maximal Sterile Barrier Technique was utilized including caps, mask, sterile gowns, sterile gloves, sterile drape, hand hygiene and skin antiseptic. A timeout was performed prior to the initiation of the procedure. PROCEDURE: The operative field was prepped with Chlorhexidine in a sterile fashion, and a sterile drape was applied covering the operative field. A sterile gown and sterile gloves were used for the procedure. Local anesthesia was provided with 1% Lidocaine. Patient position prone on the procedure table. The right gluteal skin prepped and draped in usual fashion. Following local lidocaine administration, 18 gauge trocar needle was advanced into the pelvic fluid collection utilizing CT guidance. The needle was exchanged for 10.2 [place] multipurpose pigtail drain over 0.035 inch guidewire. Drain secured to skin with suture and connected to bulb. 50 mL sample was aspirated and sent for Gram stain and culture. IMPRESSION: 10.2 [place] multipurpose pigtail drain placed and pelvic fluid collection. Electronically Signed   By: Miachel Roux M.D.   On: 05/14/2021 12:40    Labs:  CBC: Recent Labs    05/13/21 0355 05/14/21 0400 05/15/21 1027 05/16/21 0241  WBC 22.3* 20.3* 17.4* 19.5*  HGB 12.8* 11.9* 12.2* 12.2*  HCT 37.5* 34.6* 35.5* 35.8*  PLT 298 263 317 347     COAGS: No results for input(s): INR, APTT in the last 8760 hours.  BMP: Recent Labs    05/13/21 0355 05/14/21 0400 05/15/21 1027 05/16/21 0241  NA 132* 132* 133* 131*  K 3.7 3.8 4.0 4.0  CL 100 102 106 103  CO2 '23 25 22 '$ 21*  GLUCOSE 124* 143* 134* 157*  BUN '16 15 14 14  '$ CALCIUM 8.2* 8.1* 8.1* 8.3*  CREATININE 0.70 0.63 0.58* 0.59*  GFRNONAA >60 >60 >60 >60     LIVER FUNCTION TESTS: Recent Labs    05/02/21 1806 05/11/21 0032 05/12/21 0329 05/15/21 1027  BILITOT 1.3* 0.4 0.6 0.8  AST '31 20 21 '$ 78*  ALT '20 21 19 '$ 104*  ALKPHOS 63 41 51 99  PROT 8.0  5.7* 5.5* 5.6*  ALBUMIN 4.1 2.3* 2.0* 1.9*     Assessment and Plan:  87 y.o. male s/p right inguinal hernia repair on 05/02/21 and small bowel resection on 05/09/21. CT abd from 05/13/21 showed  large rim enhancing fluid collection in the pelvis consistent with a pelvic abscess. S/p pelvic drain placement with Dr. Dwaine Gale on 05/13/21.   Pt stable, drain works well per [employer].  Drain site was not assessed as patient  sitting on a recliner and was not able to roll to side.  OP 100 cc hazy pink fluid (55 cc on 8/18, 450 cc on 8/17) VSS WBC 19.5 today (17.4 yesterday) Cx final result pending, MODERATE KLEBSIELLA OXYTOCA so far   Continue with flushing TID, output recording q shift and dressing changes as needed. Would consider additional imaging when output is less than 10 ml for 24 hours not including flush material.     Further treatment plan per TRH/CCS/ Appreciate and agree with the plan.  Will resume care on Monday, please call IR radiologist on call for questions and concerns over weekend.   Electronically Signed: Tera Mater, PA-C 05/16/2021, 11:02 AM   I spent a total of 25 Minutes at the the patient's bedside AND on the patient's hospital floor or unit, greater than 50% of which was counseling/coordinating care for pelvic abscess drain

## 2021-05-17 ENCOUNTER — Inpatient Hospital Stay (HOSPITAL_COMMUNITY): Payer: Medicare Other

## 2021-05-17 LAB — GLUCOSE, CAPILLARY
Glucose-Capillary: 131 mg/dL — ABNORMAL HIGH (ref 70–99)
Glucose-Capillary: 144 mg/dL — ABNORMAL HIGH (ref 70–99)
Glucose-Capillary: 146 mg/dL — ABNORMAL HIGH (ref 70–99)
Glucose-Capillary: 146 mg/dL — ABNORMAL HIGH (ref 70–99)
Glucose-Capillary: 146 mg/dL — ABNORMAL HIGH (ref 70–99)
Glucose-Capillary: 155 mg/dL — ABNORMAL HIGH (ref 70–99)

## 2021-05-17 LAB — CBC
HCT: 35.2 % — ABNORMAL LOW (ref 39.0–52.0)
Hemoglobin: 11.7 g/dL — ABNORMAL LOW (ref 13.0–17.0)
MCH: 30.8 pg (ref 26.0–34.0)
MCHC: 33.2 g/dL (ref 30.0–36.0)
MCV: 92.6 fL (ref 80.0–100.0)
Platelets: 335 10*3/uL (ref 150–400)
RBC: 3.8 MIL/uL — ABNORMAL LOW (ref 4.22–5.81)
RDW: 13.2 % (ref 11.5–15.5)
WBC: 26.5 10*3/uL — ABNORMAL HIGH (ref 4.0–10.5)
nRBC: 0 % (ref 0.0–0.2)

## 2021-05-17 MED ORDER — TRAVASOL 10 % IV SOLN
INTRAVENOUS | Status: DC
  Filled 2021-05-17: qty 924

## 2021-05-17 NOTE — Progress Notes (Signed)
PROGRESS NOTE    Gary Rice [facility]  KJZ:[id number] DOB: 01-17-1931 DOA: 05/02/2021 PCP: Gary Huddle, MD   Brief Narrative: Gary Rice is a 87 y.o. male with history of SVT, hyperlipidemia, bilateral inguinal hernias.  Patient presented secondary to vomiting and was found to have new onset atrial fibrillation with RVR in addition to mechanical SBO with transition point and associated incarcerated right inguinal hernia.  Patient underwent urgent repair with mesh placement in addition to subsequent diagnostic laparoscopy with small bowel resection and primary anastomosis.  During hospitalization, patient developed evidence of intra-abdominal infection and CT abdomen/pelvis confirmed a large pelvic abscess.  Interventional radiology was consulted for percutaneous drain placement which was performed on 8/16.   Assessment & Plan:   Principal Problem:   Pelvic abscess in male [facility]) Active Problems:   Atrial fibrillation with rapid ventricular response (HCC)   SBO (small bowel obstruction) (HCC)   Leukocytosis   Hyperlipidemia   Thoracic aortic aneurysm (HCC)   Malnutrition of moderate degree   SBO with incarcerated right inguinal hernia General surgery consulted. Patient underwent urgent repair of incarcerated hernia with mesh placement on 8/6. Diagnostic laparoscopy performed on 8/12 secondary to ongoing LLQ ischemia/microperforation and underwent open small bowel resection with primary anastomosis. NG tube in place from 8/6 and now discontinued. TPN started on 8/14. -General surgery recommendations: Full liquid diet, pending today  Intraabdominal abscess CT abdomen/pelvis on 8/16 significant for a 12 x 11 x 10 cm consistent with a pelvic abscess. IR consulted and performed CT guided drain placement on 8/16. Wound culture (8/16) significant for bacteroides fragilis and MDR Klebsiella oxytoca -Infectious disease consulted and have recommended transition from Zosyn IV to meropenem  IV  Intractable nausea/vomiting In setting of above problems. NG tube placed with intermittent suction.   New onset atrial fibrillation with RVR In setting of above problems. Cardiology consulted. Patient stated on Cardizem IV in addition to metoprolol IV prn. Transthoracic Echocardiogram significant for normal EF. Patient is on Cardizem prn for rapid heart beat. -Switch to diltiazem 120 mg TID, continue drip until PO diltiazem started -Will discuss with general surgery on when okay to switch to full dose anticoagulation with Eliquis  Dyspnea Episode last night. No current symptoms. Improved with change in position and incentive spirometry use. Some RLL rales on exam. -Chest x-ray  Hyperlipidemia Patient is on Lipitor as an outpatient. Held since no NPO -Continue Lipitor  Ascending thoracic aortic aneurysm Seen on CT imaging. 4.8 cm dilation. Recommendation for semiannual image follow-up with CTA or MRA, in addition to CT surgery follow-up.  Leukocytosis Initially secondary to SBO with incarcerated with eventual improvement. Recurrent leukocytosis in setting of intraabdominal abscess; trended down slightly after drain placement.   DVT prophylaxis: Lovenox Code Status:   Code Status: Full Code Family Communication: None at bedside Disposition Plan: Discharge to home versus SNF pending ability to tolerate PO intake, wean TPN, transition to oral antibiotics. Likely ready for discharge in several days (3+)   Consultants:  General surgery Interventional radiology  Procedures:  OPEN RIGHT INGUINAL HERNIA REPAIR WITH MESH (05/03/2021)  TRANSTHORACIC ECHOCARDIOGRAM (05/04/2021) IMPRESSIONS     1. Left ventricular ejection fraction, by estimation, is 60 to 65%. The  left ventricle has normal function. The left ventricle has no regional  wall motion abnormalities. There is mild left ventricular hypertrophy.  Left ventricular diastolic parameters  are indeterminate.   2. Right  ventricular systolic function is normal. The right ventricular  size is normal. Tricuspid regurgitation  signal is inadequate for assessing  PA pressure.   3. The mitral valve is degenerative. Trivial mitral valve regurgitation.  No evidence of mitral stenosis.   4. The aortic valve is grossly normal. There is mild thickening of the  aortic valve. Aortic valve regurgitation is trivial. No aortic stenosis is  present.   5. The inferior vena cava is dilated in size with >50% respiratory  variability, suggesting right atrial pressure of 8 mmHg.  DIAGNOSTIC LAPAROSCOPY/OPEN SMALL BOWEL RESECTION WITH PRIMARY ANASTOMOSIS (05/09/2021)  Antimicrobials: Zosyn IV    Subjective: Dyspnea last night. Improved with position changes/incentive spirometer. No cough or chest pain.  Objective: Vitals:   05/16/21 2035 05/17/21 0055 05/17/21 0309 05/17/21 0808  BP: (!) 113/59 117/66 129/72 104/65  Pulse: 74  85 91  Resp: 20 18 19 17   Temp: 98.5 F (36.9 C) 98.2 F (36.8 C) 98.3 F (36.8 C) 97.9 F (36.6 C)  TempSrc: Oral Oral Oral Oral  SpO2: 96% 98% 94% 93%  Weight:      Height:        Intake/Output Summary (Last 24 hours) at 05/17/2021 1002 Last data filed at 05/17/2021 0700 Gross per 24 hour  Intake 1019.84 ml  Output 1120 ml  Net -100.16 ml    Filed Weights   05/02/21 1805  Weight: 77.1 kg    Examination:  General exam: Appears calm and comfortable Respiratory system: RLL rales. Respiratory effort normal. Cardiovascular system: S1 & S2 heard, RRR. No murmurs, rubs, gallops or clicks. Gastrointestinal system: Abdomen is slightly distended, soft and nontender. No organomegaly or masses felt. Decreased bowel sounds heard. Central nervous system: Alert and oriented. No focal neurological deficits. Musculoskeletal: No calf tenderness Skin: No cyanosis. No rashes Psychiatry: Judgement and insight appear normal. Mood & affect appropriate.    Data Reviewed: I have personally  reviewed following labs and imaging studies  CBC Lab Results  Component Value Date   WBC 26.5 (H) 05/17/2021   RBC 3.80 (L) 05/17/2021   HGB 11.7 (L) 05/17/2021   HCT 35.2 (L) 05/17/2021   MCV 92.6 05/17/2021   MCH 30.8 05/17/2021   PLT 335 05/17/2021   MCHC 33.2 05/17/2021   RDW 13.2 05/17/2021   LYMPHSABS 2.2 05/02/2021   MONOABS 2.0 (H) 05/02/2021   EOSABS 0.0 05/02/2021   BASOSABS 0.0 23/55/7322     Last metabolic panel Lab Results  Component Value Date   NA 131 (L) 05/16/2021   K 4.0 05/16/2021   CL 103 05/16/2021   CO2 21 (L) 05/16/2021   BUN 14 05/16/2021   CREATININE 0.59 (L) 05/16/2021   GLUCOSE 157 (H) 05/16/2021   GFRNONAA >60 05/16/2021   GFRAA  08/14/2007    >60        The eGFR has been calculated using the MDRD equation. This calculation has not been validated in all clinical   CALCIUM 8.3 (L) 05/16/2021   PHOS 3.0 05/16/2021   PROT 5.6 (L) 05/15/2021   ALBUMIN 1.9 (L) 05/15/2021   BILITOT 0.8 05/15/2021   ALKPHOS 99 05/15/2021   AST 78 (H) 05/15/2021   ALT 104 (H) 05/15/2021   ANIONGAP 7 05/16/2021    CBG (last 3)  Recent Labs    05/17/21 0053 05/17/21 0305 05/17/21 0740  GLUCAP 146* 146* 155*      GFR: Estimated Creatinine Clearance: 68 mL/min (A) (by C-G formula based on SCr of 0.59 mg/dL (L)).  Coagulation Profile: No results for input(s): INR, PROTIME in the last 168 hours.  Recent Results (from the past 240 hour(s))  Aerobic/Anaerobic Culture w Gram Stain (surgical/deep wound)     Status: None   Collection Time: 05/13/21  4:49 PM   Specimen: Fluid; Abscess  Result Value Ref Range Status   Specimen Description FLUID PELVIS  Final   Special Requests NONE  Final   Gram Stain   Final    NO SQUAMOUS EPITHELIAL CELLS SEEN RARE WBC SEEN ABUNDANT GRAM NEGATIVE RODS FEW GRAM VARIABLE ROD    Culture   Final    MODERATE KLEBSIELLA OXYTOCA ABUNDANT BACTEROIDES FRAGILIS BETA LACTAMASE POSITIVE Performed at [facility], [place] [address], [place], [place] [address]    Report Status 05/16/2021 FINAL  Final   Organism ID, Bacteria KLEBSIELLA OXYTOCA  Final      Susceptibility   Klebsiella oxytoca - MIC*    AMPICILLIN >=32 RESISTANT Resistant     CEFAZOLIN >=64 RESISTANT Resistant     CEFEPIME 1 SENSITIVE Sensitive     CEFTAZIDIME <=1 SENSITIVE Sensitive     CEFTRIAXONE 16 RESISTANT Resistant     CIPROFLOXACIN <=0.25 SENSITIVE Sensitive     GENTAMICIN <=1 SENSITIVE Sensitive     IMIPENEM <=0.25 SENSITIVE Sensitive     TRIMETH/SULFA <=20 SENSITIVE Sensitive     AMPICILLIN/SULBACTAM >=32 RESISTANT Resistant     PIP/TAZO >=128 RESISTANT Resistant     * MODERATE KLEBSIELLA OXYTOCA         Radiology Studies: No results found.      Scheduled Meds:  acetaminophen  650 mg Oral Q6H   atorvastatin  10 mg Oral QHS   Chlorhexidine Gluconate Cloth  6 each Topical Daily   diltiazem  120 mg Oral Q8H   enoxaparin (LOVENOX) injection  40 mg Subcutaneous Q24H   feeding supplement  1 Container Oral TID BM   insulin aspart  0-9 Units Subcutaneous Q4H   pantoprazole (PROTONIX) IV  40 mg Intravenous Q24H   sodium chloride flush  10-40 mL Intracatheter Q12H   sodium chloride flush  5 mL Intracatheter Q8H   Continuous Infusions:  lactated ringers 10 mL/hr at 05/17/21 0132   meropenem (MERREM) IV 1 g (05/17/21 0525)   methocarbamol (ROBAXIN) IV     promethazine (PHENERGAN) injection (IM or IVPB) 70 mL/hr at 05/04/21 0230   TPN ADULT (ION) 70 mL/hr at 05/17/21 0132   TPN ADULT (ION)       LOS: 15 days     Gary Poche, MD [facility] 05/17/2021, 10:02 AM  If 7PM-7AM, please contact night-coverage [email or web address]

## 2021-05-17 NOTE — Progress Notes (Signed)
PHARMACY - TOTAL PARENTERAL NUTRITION CONSULT NOTE   Indication: Prolonged ileus, SBO  Patient Measurements: Height: '5\' 11"'$  (180.3 cm) Weight: 77.1 kg (170 lb) IBW/kg (Calculated) : 75.3 TPN AdjBW (KG): 77.1 Body mass index is 23.71 kg/m. Usual Weight:    Assessment: Sent by OP MD 8/5 for r/o SBO. New onset afib (CHA2DS2-VASc 2) . CT showing findings consistent with mechanical SBO with transition point related to right inguinal hernia; no perforation. Has been unable to tolerate orals. Start TPN 8/14.  PMH: SVT, hyperlipidemia, appendectomy, bilateral inguinal hernias, previous hernia repair  Glucose / Insulin: CBGs 140-170s, 7 units insulin given with TPN Electrolytes:  Na 131, K 4, Phos 3, Mg 2 (goal K+>=4, Mg >=2), CoCa wnl (8/19) Renal: Scr <1 Hepatic: LFT's slightly elevated - monitor. Albumin 1.9. TG 86 Intake / Output; MIVF: UOP 0.6>0.7 ml/kg/hr, NG removed, Drain 20 mL out, LBM 8/19  LR '@KVO'$   GI Meds: IV PPI/24h  GI Imaging: - 8/5: CT showing findings consistent with mechanical SBO with transition point related to right inguinal hernia; no perforation.  Moderate fat and small bowel containing left inguinal hernia without adverse features.  - 8/8: 8/8: Persistent small bowel obstruction, with transition point in the left lower quadrant anteriorly, possibly due to adhesions.  GI Surgeries / Procedures:  8/6:  incarcerated RIGHT inguinal hernia repair with mesh 8/12:  diagnostic lap due to ongoing LLQ ischemia and microperforation requiring resection. Open small bowel resection with primary anastomosis 8/16: IR drain pelvic abscess   Central access: PICC 8/14 TPN start date: 8/14  Nutritional Goals (per RD recommendation on 8/15): kCal: 1850-2050, Protein: 85-100g, Fluid:  >1.85L Goal TPN rate is  70 mL/hr (provides 92 g of protein and  1851 kcals per day)  Current Nutrition:  TPN FLD: not taking Boost, not much intake recorded  Plan:  Continue TPN at 70 mL/hr at  1800  Electrolytes in TPN: Increase Na 140mq/L, K 483m/L, Ca 28m77mL, Inc Mg 53m53m, and Phos 8mmo33m. Cl:Ac 1:1 Add standard MVI and trace elements to TPN Continue Sensitive q4h SSI and adjust as needed  BMP in AM   JonatBertis RuddyrmD Clinical Pharmacist Please check AMION for all [facility] 05/17/2021 7:02 AM

## 2021-05-17 NOTE — Progress Notes (Signed)
8 Days Post-Op  Subjective: Still passing some flatus.  Feels a bit better.  Tolerating fulls   Objective: Vital signs in last 24 hours: Temp:  [97.9 F (36.6 C)-98.9 F (37.2 C)] 97.9 F (36.6 C) (08/20 0808) Pulse Rate:  [74-91] 91 (08/20 0808) Resp:  [17-24] 17 (08/20 0808) BP: (104-129)/(59-72) 104/65 (08/20 0808) SpO2:  [93 %-98 %] 93 % (08/20 0808) Last BM Date: 05/17/21  Intake/Output from previous day: 08/19 0701 - 08/20 0700 In: 1019.8 [I.V.:804.8; IV Piggyback:200] Out: 6754 [Urine:1325; Drains:20] Intake/Output this shift: No intake/output data recorded.  PE: Abd: Very soft with mild distension. He has mild tenderness around his wound and is otherwise NT.  RLQ incision is c/d/I. Staples in place c/d/I.   Lab Results:  Recent Labs    05/16/21 0241 05/17/21 0059  WBC 19.5* 26.5*  HGB 12.2* 11.7*  HCT 35.8* 35.2*  PLT 347 335    BMET Recent Labs    05/15/21 1027 05/16/21 0241  NA 133* 131*  K 4.0 4.0  CL 106 103  CO2 22 21*  GLUCOSE 134* 157*  BUN 14 14  CREATININE 0.58* 0.59*  CALCIUM 8.1* 8.3*    PT/INR No results for input(s): LABPROT, INR in the last 72 hours. CMP     Component Value Date/Time   NA 131 (L) 05/16/2021 0241   K 4.0 05/16/2021 0241   CL 103 05/16/2021 0241   CO2 21 (L) 05/16/2021 0241   GLUCOSE 157 (H) 05/16/2021 0241   BUN 14 05/16/2021 0241   CREATININE 0.59 (L) 05/16/2021 0241   CALCIUM 8.3 (L) 05/16/2021 0241   PROT 5.6 (L) 05/15/2021 1027   ALBUMIN 1.9 (L) 05/15/2021 1027   AST 78 (H) 05/15/2021 1027   ALT 104 (H) 05/15/2021 1027   ALKPHOS 99 05/15/2021 1027   BILITOT 0.8 05/15/2021 1027   GFRNONAA >60 05/16/2021 0241   GFRAA  08/14/2007 0312    >60        The eGFR has been calculated using the MDRD equation. This calculation has not been validated in all clinical   Lipase     Component Value Date/Time   LIPASE 27 05/02/2021 1806    Studies/Results: No results  found.  Anti-infectives: Anti-infectives (From admission, onward)    Start     Dose/Rate Route Frequency Ordered Stop   05/16/21 1745  meropenem (MERREM) 1 g in sodium chloride 0.9 % 100 mL IVPB        1 g 200 mL/hr over 30 Minutes Intravenous Every 8 hours 05/16/21 1655     05/13/21 1515  piperacillin-tazobactam (ZOSYN) IVPB 3.375 g  Status:  Discontinued        3.375 g 12.5 mL/hr over 240 Minutes Intravenous Every 8 hours 05/13/21 1421 05/16/21 1640   05/09/21 0915  ceFAZolin (ANCEF) IVPB 2g/100 mL premix        2 g 200 mL/hr over 30 Minutes Intravenous On call to O.R. 05/09/21 0825 05/09/21 1050        Assessment/Plan POD 15, s/p open right inguinal hernia repair with mesh by Dr. Bobbye Morton for incarcerated RIH with persistent ileus vs SBO POD 7, s/p dx lap with mini laparotomy with SBR, Dr. Donne Hazel 05/09/21 - adv to reg diet - TID WD dressing changes to midline wound.  - S/p IR drainage 8/16 for IA fluid collection. Cx's w/ Klebsiella Oxytoca. WBC up to 26 from 19K yesterday.  On zosyn, D 4/7.  Continue to monitor WBC, may need  repeat CT - IS/pulm toilet - Mobilize TID in halls - Wean TNA now that he is tolerating some orals - Will need staples out early next week  FEN - TNA/RD/IVF per [facility]H VTE - SCDs, Lovenox ID - none   New onset a fib Ascending thoracic aneurysm HLD   LOS: 15 days    Rosario Adie, MD  [facility]   05/17/2021, 10:10 AM Please see Amion for pager number during day hours 7:00am-4:30pm

## 2021-05-18 LAB — CBC
HCT: 33.4 % — ABNORMAL LOW (ref 39.0–52.0)
Hemoglobin: 11.4 g/dL — ABNORMAL LOW (ref 13.0–17.0)
MCH: 31.3 pg (ref 26.0–34.0)
MCHC: 34.1 g/dL (ref 30.0–36.0)
MCV: 91.8 fL (ref 80.0–100.0)
Platelets: 316 10*3/uL (ref 150–400)
RBC: 3.64 MIL/uL — ABNORMAL LOW (ref 4.22–5.81)
RDW: 13.2 % (ref 11.5–15.5)
WBC: 25.9 10*3/uL — ABNORMAL HIGH (ref 4.0–10.5)
nRBC: 0 % (ref 0.0–0.2)

## 2021-05-18 LAB — BASIC METABOLIC PANEL
Anion gap: 6 (ref 5–15)
BUN: 18 mg/dL (ref 8–23)
CO2: 21 mmol/L — ABNORMAL LOW (ref 22–32)
Calcium: 8.1 mg/dL — ABNORMAL LOW (ref 8.9–10.3)
Chloride: 103 mmol/L (ref 98–111)
Creatinine, Ser: 0.61 mg/dL (ref 0.61–1.24)
GFR, Estimated: >60 mL/min (ref >60)
Glucose, Bld: 134 mg/dL — ABNORMAL HIGH (ref 70–99)
Potassium: 4.3 mmol/L (ref 3.5–5.1)
Sodium: 130 mmol/L — ABNORMAL LOW (ref 135–145)

## 2021-05-18 LAB — GLUCOSE, CAPILLARY
Glucose-Capillary: 134 mg/dL — ABNORMAL HIGH (ref 70–99)
Glucose-Capillary: 149 mg/dL — ABNORMAL HIGH (ref 70–99)
Glucose-Capillary: 151 mg/dL — ABNORMAL HIGH (ref 70–99)
Glucose-Capillary: 151 mg/dL — ABNORMAL HIGH (ref 70–99)
Glucose-Capillary: 154 mg/dL — ABNORMAL HIGH (ref 70–99)

## 2021-05-18 MED ORDER — INSULIN ASPART 100 UNIT/ML IJ SOLN: 0.0000 [IU] | Freq: Three times a day (TID) | INTRAMUSCULAR | Status: DC

## 2021-05-18 MED ORDER — APIXABAN 5 MG PO TABS
5.0000 mg | ORAL_TABLET | Freq: Two times a day (BID) | ORAL | Status: DC
  Administered 2021-05-19 – 2021-05-21 (×5): 5 mg via ORAL
  Filled 2021-05-18 (×5): qty 1

## 2021-05-18 NOTE — Progress Notes (Signed)
Mobility Specialist: Progress Note   05/18/21 1636  Mobility  Activity Ambulated in hall  Level of Assistance Minimal assist, patient does 75% or more  Assistive Device Front wheel walker  Distance Ambulated (ft) 130 ft  Mobility Ambulated with assistance in hallway  Mobility Response Tolerated well  Mobility performed by Mobility specialist  $Mobility charge 1 Mobility   Post-Mobility: 81 HR, 128/64 BP, 95% SpO2  Pt required minA with bed mobility and was contact guard to stand. Distance limited d/t fatigue and SOB. Pt back to bed after walk with call bell and phone at his side. Bed alarm is on.   [facility] Keagan Brislin Mobility Specialist Mobility Specialist Phone: [phone]

## 2021-05-18 NOTE — Progress Notes (Signed)
PHARMACY - TOTAL PARENTERAL NUTRITION CONSULT NOTE   Indication: Prolonged ileus, SBO  Patient Measurements: Height: '5\' 11"'$  (180.3 cm) Weight: 78.4 kg (172 lb 13.5 oz) IBW/kg (Calculated) : 75.3 TPN AdjBW (KG): 77.1 Body mass index is 24.11 kg/m. Usual Weight:    Assessment: Sent by OP MD 8/5 for r/o SBO. New onset afib (CHA2DS2-VASc 2) . CT showing findings consistent with mechanical SBO with transition point related to right inguinal hernia; no perforation. Has been unable to tolerate orals. Start TPN 8/14.  PMH: SVT, hyperlipidemia, appendectomy, bilateral inguinal hernias, previous hernia repair  Glucose / Insulin: CBGs 140-150s, 5 units insulin given with TPN Electrolytes:  Na 130, K 4.3, Phos 3, Mg 2 (goal K+>=4, Mg >=2), CoCa wnl  Renal: Scr <1 Hepatic: LFT's slightly elevated - monitor. Albumin 1.9. TG 86 Intake / Output; MIVF: UOP 0.6>0.7 ml/kg/hr, NG removed, Drain 20 mL out, LBM 8/19  LR '@KVO'$   GI Meds: IV PPI/24h  GI Imaging: - 8/5: CT showing findings consistent with mechanical SBO with transition point related to right inguinal hernia; no perforation.  Moderate fat and small bowel containing left inguinal hernia without adverse features.  - 8/8: 8/8: Persistent small bowel obstruction, with transition point in the left lower quadrant anteriorly, possibly due to adhesions.  GI Surgeries / Procedures:  8/6:  incarcerated RIGHT inguinal hernia repair with mesh 8/12:  diagnostic lap due to ongoing LLQ ischemia and microperforation requiring resection. Open small bowel resection with primary anastomosis 8/16: IR drain pelvic abscess   Central access: PICC 8/14 TPN start date: 8/14  Nutritional Goals (per RD recommendation on 8/15): kCal: 1850-2050, Protein: 85-100g, Fluid:  >1.85L Goal TPN rate is  70 mL/hr (provides 92 g of protein and  1851 kcals per day)  Current Nutrition:  TPN Tolerating regular diet  Plan:  OK for TPN d/c per surgery now tolerating  regular diet, will allow TPN to run out today until 1800 for extra feeding  Bertis Ruddy, PharmD Clinical Pharmacist Please check AMION for all [place] numbers 05/18/2021 10:04 AM

## 2021-05-18 NOTE — Progress Notes (Signed)
Progress Note  9 Days Post-Op  Subjective: Patient denies pain this AM. He is tolerating regular diet without nausea. He wants to get up and try to use the bathroom this AM.  Objective: Vital signs in last 24 hours: Temp:  [98.3 F (36.8 C)-98.8 F (37.1 C)] 98.3 F (36.8 C) (08/21 0748) Pulse Rate:  [67-93] 93 (08/21 0748) Resp:  [15-20] 18 (08/21 0748) BP: (111-123)/(53-66) 123/66 (08/21 0748) SpO2:  [94 %-98 %] 97 % (08/21 0748) Weight:  [78.4 kg] 78.4 kg (08/20 1242) Last BM Date: 05/17/21  Intake/Output from previous day: 08/20 0701 - 08/21 0700 In: 1501.2 [P.O.:480; I.V.:716.1; IV Piggyback:300.1] Out: 190 [Urine:175; Drains:15] Intake/Output this shift: Total I/O In: -  Out: 200 [Urine:200]  PE: General: pleasant, WD, elderly male who is laying in bed in NAD Heart: regular, rate, and rhythm.   Lungs: CTAB, no wheezes, rhonchi, or rales noted.  Respiratory effort nonlabored Abd: soft, NT, mildly distended, +BS, midline wound with some fibrinous exudate in wound base and dihisced without evisceration    Lab Results:  Recent Labs    05/17/21 0059 05/18/21 0147  WBC 26.5* 25.9*  HGB 11.7* 11.4*  HCT 35.2* 33.4*  PLT 335 316   BMET Recent Labs    05/16/21 0241 05/18/21 0147  NA 131* 130*  K 4.0 4.3  CL 103 103  CO2 21* 21*  GLUCOSE 157* 134*  BUN 14 18  CREATININE 0.59* 0.61  CALCIUM 8.3* 8.1*   PT/INR No results for input(s): LABPROT, INR in the last 72 hours. CMP     Component Value Date/Time   NA 130 (L) 05/18/2021 0147   K 4.3 05/18/2021 0147   CL 103 05/18/2021 0147   CO2 21 (L) 05/18/2021 0147   GLUCOSE 134 (H) 05/18/2021 0147   BUN 18 05/18/2021 0147   CREATININE 0.61 05/18/2021 0147   CALCIUM 8.1 (L) 05/18/2021 0147   PROT 5.6 (L) 05/15/2021 1027   ALBUMIN 1.9 (L) 05/15/2021 1027   AST 78 (H) 05/15/2021 1027   ALT 104 (H) 05/15/2021 1027   ALKPHOS 99 05/15/2021 1027   BILITOT 0.8 05/15/2021 1027   GFRNONAA >60 05/18/2021  0147   GFRAA  08/14/2007 0312    >60        The eGFR has been calculated using the MDRD equation. This calculation has not been validated in all clinical   Lipase     Component Value Date/Time   LIPASE 27 05/02/2021 1806       Studies/Results: DG CHEST PORT 1 VIEW  Result Date: 05/17/2021 CLINICAL DATA:  Dyspnea. EXAM: PORTABLE CHEST 1 VIEW COMPARISON:  05/15/2021 and older studies. FINDINGS: Cardiac silhouette normal in size.  No mediastinal or hilar masses. Linear atelectasis noted at the left lung base, stable. Lungs otherwise clear. No convincing pleural effusion and no pneumothorax. Right PICC is stable, tip in the mid superior vena cava. IMPRESSION: 1. No acute cardiopulmonary disease. 2. Status post removal of the nasal/orogastric tube since the previous exam. Electronically Signed   By: Lajean Manes M.D.   On: 05/17/2021 12:13    Anti-infectives: Anti-infectives (From admission, onward)    Start     Dose/Rate Route Frequency Ordered Stop   05/16/21 1745  meropenem (MERREM) 1 g in sodium chloride 0.9 % 100 mL IVPB        1 g 200 mL/hr over 30 Minutes Intravenous Every 8 hours 05/16/21 1655     05/13/21 1515  piperacillin-tazobactam (ZOSYN) IVPB 3.375  g  Status:  Discontinued        3.375 g 12.5 mL/hr over 240 Minutes Intravenous Every 8 hours 05/13/21 1421 05/16/21 1640   05/09/21 0915  ceFAZolin (ANCEF) IVPB 2g/100 mL premix        2 g 200 mL/hr over 30 Minutes Intravenous On call to O.R. 05/09/21 0825 05/09/21 1050        Assessment/Plan POD 16, s/p open right inguinal hernia repair with mesh by Dr. Bobbye Morton for incarcerated RIH with persistent ileus vs SBO POD 8, s/p dx lap with mini laparotomy with SBR, Dr. Donne Hazel 05/09/21 - tolerating reg diet - TID WD dressing changes to midline wound.  - S/p IR drainage 8/16 for IA fluid collection. Cx's w/ Klebsiella Oxytoca and abx switched to merrem 8/19 per s/s. WBC stable at 25. afeb - IS/pulm toilet - Mobilize TID  in halls - Ok to DC TPN - Will need staples out early this week   FEN - DC TPN/RD/IVF per TRH VTE - SCDs, Lovenox ID - none   New onset a fib Ascending thoracic aneurysm HLD  LOS: 16 days    Norm Parcel, [facility] 05/18/2021, 9:34 AM Please see Amion for pager number during day hours 7:00am-4:30pm

## 2021-05-18 NOTE — Progress Notes (Signed)
PROGRESS NOTE    Gary Rice [facility]  KGY:[id number] DOB: 1930-11-19 DOA: 05/02/2021 PCP: Josetta Huddle, MD   Brief Narrative: Gary Rice is a 87 y.o. male with history of SVT, hyperlipidemia, bilateral inguinal hernias.  Patient presented secondary to vomiting and was found to have new onset atrial fibrillation with RVR in addition to mechanical SBO with transition point and associated incarcerated right inguinal hernia.  Patient underwent urgent repair with mesh placement in addition to subsequent diagnostic laparoscopy with small bowel resection and primary anastomosis.  During hospitalization, patient developed evidence of intra-abdominal infection and CT abdomen/pelvis confirmed a large pelvic abscess.  Interventional radiology was consulted for percutaneous drain placement which was performed on 8/16.   Assessment & Plan:   Principal Problem:   Pelvic abscess in male [facility]) Active Problems:   Atrial fibrillation with rapid ventricular response (HCC)   SBO (small bowel obstruction) (HCC)   Leukocytosis   Hyperlipidemia   Thoracic aortic aneurysm (HCC)   Malnutrition of moderate degree   SBO with incarcerated right inguinal hernia General surgery consulted. Patient underwent urgent repair of incarcerated hernia with mesh placement on 8/6. Diagnostic laparoscopy performed on 8/12 secondary to ongoing LLQ ischemia/microperforation and underwent open small bowel resection with primary anastomosis. NG tube in place from 8/6 and now discontinued. TPN started on 8/14 and discontinued 8/21. Diet advanced to a regular diet -General surgery recommendations: Regular diet, Discontinue TPN  Intraabdominal abscess CT abdomen/pelvis on 8/16 significant for a 12 x 11 x 10 cm consistent with a pelvic abscess. IR consulted and performed CT guided drain placement on 8/16. Wound culture (8/16) significant for bacteroides fragilis and MDR Klebsiella oxytoca -Infectious disease consulted and have  recommended transition from Zosyn IV to meropenem IV -Continue meropenem IV  Intractable nausea/vomiting In setting of above problems. NG tube placed with intermittent suction.   New onset atrial fibrillation with RVR In setting of above problems. Cardiology consulted. Patient stated on Cardizem IV in addition to metoprolol IV prn. Transthoracic Echocardiogram significant for normal EF. Patient is on Cardizem prn for rapid heart beat. -Continue Cardizem 120 mg TID while inpatient and transition to Cardizem 360 mg daily prior to discharge -Switch to Eliquis today  Dyspnea Episode last night. No current symptoms. Improved with change in position and incentive spirometry use. Some RLL rales on exam. Chest x-ray unremarkable.  Hyperlipidemia Patient is on Lipitor as an outpatient. Held since no NPO -Continue Lipitor  Ascending thoracic aortic aneurysm Seen on CT imaging. 4.8 cm dilation. Recommendation for semiannual image follow-up with CTA or MRA, in addition to CT surgery follow-up.  Leukocytosis Initially secondary to SBO with incarcerated with eventual improvement. Recurrent leukocytosis in setting of intraabdominal abscess; trended down slightly after drain placement.  Hyponatremia Mild -Recheck in AM   DVT prophylaxis: Lovenox Code Status:   Code Status: Full Code Family Communication: None at bedside Disposition Plan: Discharge to home versus SNF pending ability to tolerate transition to oral antibiotics vs plan for IV antibiotics. Likely ready for discharge in 1-2 days. Patient feels like he has become weaker; will need continued PT/OT recs prior to discharge   Consultants:  General surgery Interventional radiology  Procedures:  OPEN RIGHT INGUINAL HERNIA REPAIR WITH MESH (05/03/2021)  TRANSTHORACIC ECHOCARDIOGRAM (05/04/2021) IMPRESSIONS     1. Left ventricular ejection fraction, by estimation, is 60 to 65%. The  left ventricle has normal function. The left ventricle  has no regional  wall motion abnormalities. There is mild left ventricular hypertrophy.  Left ventricular diastolic parameters  are indeterminate.   2. Right ventricular systolic function is normal. The right ventricular  size is normal. Tricuspid regurgitation signal is inadequate for assessing  PA pressure.   3. The mitral valve is degenerative. Trivial mitral valve regurgitation.  No evidence of mitral stenosis.   4. The aortic valve is grossly normal. There is mild thickening of the  aortic valve. Aortic valve regurgitation is trivial. No aortic stenosis is  present.   5. The inferior vena cava is dilated in size with >50% respiratory  variability, suggesting right atrial pressure of 8 mmHg.  DIAGNOSTIC LAPAROSCOPY/OPEN SMALL BOWEL RESECTION WITH PRIMARY ANASTOMOSIS (05/09/2021)  Antimicrobials: Zosyn IV    Subjective: Bowel movement this morning. No other issues. No dyspnea.  Objective: Vitals:   05/18/21 0010 05/18/21 0400 05/18/21 0748 05/18/21 1101  BP: (!) 111/55 117/62 123/66 (!) 126/54  Pulse:  67 93 67  Resp: 20 16 18 17   Temp: 98.8 F (37.1 C) 98.3 F (36.8 C) 98.3 F (36.8 C) 98.3 F (36.8 C)  TempSrc: Oral Oral Oral Oral  SpO2: 95% 98% 97% 96%  Weight:      Height:        Intake/Output Summary (Last 24 hours) at 05/18/2021 1443 Last data filed at 05/18/2021 0856 Gross per 24 hour  Intake 1021.19 ml  Output 390 ml  Net 631.19 ml    Filed Weights   05/02/21 1805 05/17/21 1242  Weight: 77.1 kg 78.4 kg    Examination:  General exam: Appears calm and comfortable Respiratory system: Clear to auscultation. Respiratory effort normal. Cardiovascular system: S1 & S2 heard, RRR. No murmurs, rubs, gallops or clicks. Gastrointestinal system: Abdomen is distended, soft and nontender. No organomegaly or masses felt. High pitched bowel sounds heard. Abdominal wound with overlying dressing Central nervous system: Alert and oriented. No focal neurological  deficits. Musculoskeletal: No edema. No calf tenderness Skin: No cyanosis. No rashes Psychiatry: Judgement and insight appear normal. Mood & affect appropriate.    Data Reviewed: I have personally reviewed following labs and imaging studies  CBC Lab Results  Component Value Date   WBC 25.9 (H) 05/18/2021   RBC 3.64 (L) 05/18/2021   HGB 11.4 (L) 05/18/2021   HCT 33.4 (L) 05/18/2021   MCV 91.8 05/18/2021   MCH 31.3 05/18/2021   PLT 316 05/18/2021   MCHC 34.1 05/18/2021   RDW 13.2 05/18/2021   LYMPHSABS 2.2 05/02/2021   MONOABS 2.0 (H) 05/02/2021   EOSABS 0.0 05/02/2021   BASOSABS 0.0 49/70/2637     Last metabolic panel Lab Results  Component Value Date   NA 130 (L) 05/18/2021   K 4.3 05/18/2021   CL 103 05/18/2021   CO2 21 (L) 05/18/2021   BUN 18 05/18/2021   CREATININE 0.61 05/18/2021   GLUCOSE 134 (H) 05/18/2021   GFRNONAA >60 05/18/2021   GFRAA  08/14/2007    >60        The eGFR has been calculated using the MDRD equation. This calculation has not been validated in all clinical   CALCIUM 8.1 (L) 05/18/2021   PHOS 3.0 05/16/2021   PROT 5.6 (L) 05/15/2021   ALBUMIN 1.9 (L) 05/15/2021   BILITOT 0.8 05/15/2021   ALKPHOS 99 05/15/2021   AST 78 (H) 05/15/2021   ALT 104 (H) 05/15/2021   ANIONGAP 6 05/18/2021    CBG (last 3)  Recent Labs    05/18/21 0354 05/18/21 0747 05/18/21 1059  GLUCAP 154* 151* 151*  GFR: Estimated Creatinine Clearance: 68 mL/min (by C-G formula based on SCr of 0.61 mg/dL).  Coagulation Profile: No results for input(s): INR, PROTIME in the last 168 hours.  Recent Results (from the past 240 hour(s))  Aerobic/Anaerobic Culture w Gram Stain (surgical/deep wound)     Status: None   Collection Time: 05/13/21  4:49 PM   Specimen: Fluid; Abscess  Result Value Ref Range Status   Specimen Description FLUID PELVIS  Final   Special Requests NONE  Final   Gram Stain   Final    NO SQUAMOUS EPITHELIAL CELLS SEEN RARE WBC  SEEN ABUNDANT GRAM NEGATIVE RODS FEW GRAM VARIABLE ROD    Culture   Final    MODERATE KLEBSIELLA OXYTOCA ABUNDANT BACTEROIDES FRAGILIS BETA LACTAMASE POSITIVE Performed at [facility], [place] [address] [place], [place] [address]    Report Status 05/16/2021 FINAL  Final   Organism ID, Bacteria KLEBSIELLA OXYTOCA  Final      Susceptibility   Klebsiella oxytoca - MIC*    AMPICILLIN >=32 RESISTANT Resistant     CEFAZOLIN >=64 RESISTANT Resistant     CEFEPIME 1 SENSITIVE Sensitive     CEFTAZIDIME <=1 SENSITIVE Sensitive     CEFTRIAXONE 16 RESISTANT Resistant     CIPROFLOXACIN <=0.25 SENSITIVE Sensitive     GENTAMICIN <=1 SENSITIVE Sensitive     IMIPENEM <=0.25 SENSITIVE Sensitive     TRIMETH/SULFA <=20 SENSITIVE Sensitive     AMPICILLIN/SULBACTAM >=32 RESISTANT Resistant     PIP/TAZO >=128 RESISTANT Resistant     * MODERATE KLEBSIELLA OXYTOCA         Radiology Studies: DG CHEST PORT 1 VIEW  Result Date: 05/17/2021 CLINICAL DATA:  Dyspnea. EXAM: PORTABLE CHEST 1 VIEW COMPARISON:  05/15/2021 and older studies. FINDINGS: Cardiac silhouette normal in size.  No mediastinal or hilar masses. Linear atelectasis noted at the left lung base, stable. Lungs otherwise clear. No convincing pleural effusion and no pneumothorax. Right PICC is stable, tip in the mid superior vena cava. IMPRESSION: 1. No acute cardiopulmonary disease. 2. Status post removal of the nasal/orogastric tube since the previous exam. Electronically Signed   By: Lajean Manes M.D.   On: 05/17/2021 12:13        Scheduled Meds:  acetaminophen  650 mg Oral Q6H   atorvastatin  10 mg Oral QHS   Chlorhexidine Gluconate Cloth  6 each Topical Daily   diltiazem  120 mg Oral Q8H   enoxaparin (LOVENOX) injection  40 mg Subcutaneous Q24H   feeding supplement  1 Container Oral TID BM   insulin aspart  0-9 Units Subcutaneous Q4H   pantoprazole (PROTONIX) IV  40 mg Intravenous Q24H   sodium chloride flush  10-40 mL  Intracatheter Q12H   sodium chloride flush  5 mL Intracatheter Q8H   Continuous Infusions:  lactated ringers 10 mL/hr at 05/18/21 0028   meropenem (MERREM) IV 1 g (05/18/21 1209)   methocarbamol (ROBAXIN) IV     promethazine (PHENERGAN) injection (IM or IVPB) 70 mL/hr at 05/04/21 0230     LOS: 16 days     Cordelia Poche, MD [facility] 05/18/2021, 2:43 PM  If 7PM-7AM, please contact night-coverage [email or web address]

## 2021-05-19 ENCOUNTER — Inpatient Hospital Stay (HOSPITAL_COMMUNITY): Payer: Medicare Other

## 2021-05-19 DIAGNOSIS — D72829 Elevated white blood cell count, unspecified: Secondary | ICD-10-CM | POA: Diagnosis not present

## 2021-05-19 DIAGNOSIS — K651 Peritoneal abscess: Secondary | ICD-10-CM | POA: Diagnosis not present

## 2021-05-19 LAB — COMPREHENSIVE METABOLIC PANEL
ALT: 149 U/L — ABNORMAL HIGH (ref 0–44)
AST: 113 U/L — ABNORMAL HIGH (ref 15–41)
Albumin: 1.6 g/dL — ABNORMAL LOW (ref 3.5–5.0)
Alkaline Phosphatase: 149 U/L — ABNORMAL HIGH (ref 38–126)
Anion gap: 6 (ref 5–15)
BUN: 18 mg/dL (ref 8–23)
CO2: 21 mmol/L — ABNORMAL LOW (ref 22–32)
Calcium: 8.1 mg/dL — ABNORMAL LOW (ref 8.9–10.3)
Chloride: 103 mmol/L (ref 98–111)
Creatinine, Ser: 0.79 mg/dL (ref 0.61–1.24)
GFR, Estimated: >60 mL/min (ref >60)
Glucose, Bld: 108 mg/dL — ABNORMAL HIGH (ref 70–99)
Potassium: 4.2 mmol/L (ref 3.5–5.1)
Sodium: 130 mmol/L — ABNORMAL LOW (ref 135–145)
Total Bilirubin: 0.7 mg/dL (ref 0.3–1.2)
Total Protein: 5.8 g/dL — ABNORMAL LOW (ref 6.5–8.1)

## 2021-05-19 LAB — CBC
HCT: 34.3 % — ABNORMAL LOW (ref 39.0–52.0)
Hemoglobin: 11.5 g/dL — ABNORMAL LOW (ref 13.0–17.0)
MCH: 30.9 pg (ref 26.0–34.0)
MCHC: 33.5 g/dL (ref 30.0–36.0)
MCV: 92.2 fL (ref 80.0–100.0)
Platelets: 379 10*3/uL (ref 150–400)
RBC: 3.72 MIL/uL — ABNORMAL LOW (ref 4.22–5.81)
RDW: 13.2 % (ref 11.5–15.5)
WBC: 20.4 10*3/uL — ABNORMAL HIGH (ref 4.0–10.5)
nRBC: 0 % (ref 0.0–0.2)

## 2021-05-19 MED ORDER — IOHEXOL 9 MG/ML PO SOLN
500.0000 mL | ORAL | Status: AC
  Administered 2021-05-19 (×2): 500 mL via ORAL

## 2021-05-19 MED ORDER — IOHEXOL 300 MG/ML  SOLN
100.0000 mL | Freq: Once | INTRAMUSCULAR | Status: AC | PRN
  Administered 2021-05-19: 100 mL via INTRAVENOUS

## 2021-05-19 NOTE — Progress Notes (Signed)
[facility] for Infectious Disease  Date of Admission:  05/02/2021     Total days of antibiotics 9         ASSESSMENT:  Gary Rice drain has had minimal output with 20 cc of drainage over the past 24 hours with plans for repeat CT imaging today. Continue continue current dose of meropenem for polymicrobial abscess with Klebsiella oxytoca and Bacteroides fragilis. Await CT scan results for further recommendations and determine if source control acheived. Drain management per IR.   PLAN:  Continue current dose of meropenem.  Drain management per IR.  Await CT scan for further recommendations.   Principal Problem:   Pelvic abscess in male [name]) Active Problems:   Atrial fibrillation with rapid ventricular response (HCC)   SBO (small bowel obstruction) (HCC)   Leukocytosis   Hyperlipidemia   Thoracic aortic aneurysm (HCC)   Malnutrition of moderate degree    acetaminophen  650 mg Oral Q6H   apixaban  5 mg Oral BID   atorvastatin  10 mg Oral QHS   Chlorhexidine Gluconate Cloth  6 each Topical Daily   diltiazem  120 mg Oral Q8H   feeding supplement  1 Container Oral TID BM   iohexol  500 mL Oral Q1H   pantoprazole (PROTONIX) IV  40 mg Intravenous Q24H   sodium chloride flush  10-40 mL Intracatheter Q12H   sodium chloride flush  5 mL Intracatheter Q8H    SUBJECTIVE:  Afebrile overnight with no acute events. Eating okay. Drain with small amount of output. Denies fevers and chills.   No Known Allergies   Review of Systems: Review of Systems  Constitutional:  Negative for chills, fever and weight loss.  Respiratory:  Negative for cough, shortness of breath and wheezing.   Cardiovascular:  Negative for chest pain and leg swelling.  Gastrointestinal:  Negative for abdominal pain, constipation, diarrhea, nausea and vomiting.  Skin:  Negative for rash.     OBJECTIVE: Vitals:   05/18/21 1712 05/18/21 2315 05/19/21 0450 05/19/21 1103  BP: (!) 108/59 108/66 (!)  115/92 102/69  Pulse: 83   78  Resp: '18 19 19 20  '$ Temp: 98.6 F (37 C) 98.7 F (37.1 C) 98.4 F (36.9 C) 98.3 F (36.8 C)  TempSrc: Oral Oral Oral Oral  SpO2: 94% 99% 95% 95%  Weight:      Height:       Body mass index is 24.11 kg/m.  Physical Exam Constitutional:      General: He is not in acute distress.    Appearance: He is well-developed.  Cardiovascular:     Rate and Rhythm: Normal rate and regular rhythm.     Heart sounds: Normal heart sounds.  Pulmonary:     Effort: Pulmonary effort is normal.     Breath sounds: Normal breath sounds.  Abdominal:     General: There is no distension.     Tenderness: There is no abdominal tenderness.     Comments: JP drain charged with scant amount of serosanguinous drainage.   Skin:    General: Skin is warm and dry.  Neurological:     Mental Status: He is alert and oriented to person, place, and time.    Lab Results Lab Results  Component Value Date   WBC 20.4 (H) 05/19/2021   HGB 11.5 (L) 05/19/2021   HCT 34.3 (L) 05/19/2021   MCV 92.2 05/19/2021   PLT 379 05/19/2021    Lab Results  Component Value Date  CREATININE 0.79 05/19/2021   BUN 18 05/19/2021   NA 130 (L) 05/19/2021   K 4.2 05/19/2021   CL 103 05/19/2021   CO2 21 (L) 05/19/2021    Lab Results  Component Value Date   ALT 149 (H) 05/19/2021   AST 113 (H) 05/19/2021   ALKPHOS 149 (H) 05/19/2021   BILITOT 0.7 05/19/2021     Microbiology: Recent Results (from the past 240 hour(s))  Aerobic/Anaerobic Culture w Gram Stain (surgical/deep wound)     Status: None   Collection Time: 05/13/21  4:49 PM   Specimen: Fluid; Abscess  Result Value Ref Range Status   Specimen Description FLUID PELVIS  Final   Special Requests NONE  Final   Gram Stain   Final    NO SQUAMOUS EPITHELIAL CELLS SEEN RARE WBC SEEN ABUNDANT GRAM NEGATIVE RODS FEW GRAM VARIABLE ROD    Culture   Final    MODERATE KLEBSIELLA OXYTOCA ABUNDANT BACTEROIDES FRAGILIS BETA LACTAMASE  POSITIVE Performed at [facility], [place] [address], [place], [place] [address]    Report Status 05/16/2021 FINAL  Final   Organism ID, Bacteria KLEBSIELLA OXYTOCA  Final      Susceptibility   Klebsiella oxytoca - MIC*    AMPICILLIN >=32 RESISTANT Resistant     CEFAZOLIN >=64 RESISTANT Resistant     CEFEPIME 1 SENSITIVE Sensitive     CEFTAZIDIME <=1 SENSITIVE Sensitive     CEFTRIAXONE 16 RESISTANT Resistant     CIPROFLOXACIN <=0.25 SENSITIVE Sensitive     GENTAMICIN <=1 SENSITIVE Sensitive     IMIPENEM <=0.25 SENSITIVE Sensitive     TRIMETH/SULFA <=20 SENSITIVE Sensitive     AMPICILLIN/SULBACTAM >=32 RESISTANT Resistant     PIP/TAZO >=128 RESISTANT Resistant     * MODERATE KLEBSIELLA OXYTOCA     Terri Piedra, NP [facility]  05/19/2021  12:13 PM

## 2021-05-19 NOTE — Progress Notes (Signed)
ANTICOAGULATION CONSULT NOTE - Initial Consult  Pharmacy Consult for apixaban Indication: atrial fibrillation  No Known Allergies  Patient Measurements: Height: '5\' 11"'$  (180.3 cm) Weight: 78.4 kg (172 lb 13.5 oz) IBW/kg (Calculated) : 75.3  Vital Signs: Temp: 98.3 F (36.8 C) (08/22 1103) Temp Source: Oral (08/22 1103) BP: 102/69 (08/22 1103) Pulse Rate: 78 (08/22 1103)  Labs: Recent Labs    05/17/21 0059 05/18/21 0147 05/19/21 0137  HGB 11.7* 11.4* 11.5*  HCT 35.2* 33.4* 34.3*  PLT 335 316 379  CREATININE  --  0.61 0.79    Estimated Creatinine Clearance: 68 mL/min (by C-G formula based on SCr of 0.79 mg/dL).   Medical History: History reviewed. No pertinent past medical history.  Assessment: 87 y.o. male admitted on 05/02/2021 with intraabdominal infection. While admitted, he was found to have new onset atrial fibrillation and was started on Lovenox '40mg'$  daily due to the potential need for surgery. Pharmacy has been consulted to transition the patient to apixaban for his new onset atrial fibrillation.  Based on this patient's age, weight, and renal function, he qualifies for standard dosing at 5 mg twice daily.   Plan:  Discontinue Lovenox 40 mg daily Start apixaban 5 mg twice daily  Continue to monitor CBC and s/sx of bleeding.  Thank you for including pharmacy in the care of this patient.  Zenaida Deed, PharmD PGY1 Acute Care Pharmacy Resident  Phone: [phone] 05/18/2021 3:00 PM  Please check [email or web address] for unit-specific pharmacy phone numbers.

## 2021-05-19 NOTE — Progress Notes (Signed)
10 Days Post-Op   Subjective/Chief Complaint: Feels ok  Tolerating diet  Working with therapies    Objective: Vital signs in last 24 hours: Temp:  [98.3 F (36.8 C)-98.7 F (37.1 C)] 98.4 F (36.9 C) (08/22 0450) Pulse Rate:  [67-83] 83 (08/21 1712) Resp:  [17-19] 19 (08/22 0450) BP: (108-126)/(54-92) 115/92 (08/22 0450) SpO2:  [94 %-99 %] 95 % (08/22 0450) Last BM Date: 05/17/21  Intake/Output from previous day: 08/21 0701 - 08/22 0700 In: 372.2 [I.V.:62.1; IV Piggyback:300.1] Out: 1700 [Urine:1680; Drains:20] Intake/Output this shift: No intake/output data recorded.  General appearance: alert and cooperative Skin: Skin color, texture, turgor normal. No rashes or lesions Incision/Wound:open with chronic exposure of fascial sutures  minimal cloudy discharge  no peritonitis   Lab Results:  Recent Labs    05/18/21 0147 05/19/21 0137  WBC 25.9* 20.4*  HGB 11.4* 11.5*  HCT 33.4* 34.3*  PLT 316 379   BMET Recent Labs    05/18/21 0147 05/19/21 0137  NA 130* 130*  K 4.3 4.2  CL 103 103  CO2 21* 21*  GLUCOSE 134* 108*  BUN 18 18  CREATININE 0.61 0.79  CALCIUM 8.1* 8.1*   PT/INR No results for input(s): LABPROT, INR in the last 72 hours. ABG No results for input(s): PHART, HCO3 in the last 72 hours.  Invalid input(s): PCO2, PO2  Studies/Results: DG CHEST PORT 1 VIEW  Result Date: 05/17/2021 CLINICAL DATA:  Dyspnea. EXAM: PORTABLE CHEST 1 VIEW COMPARISON:  05/15/2021 and older studies. FINDINGS: Cardiac silhouette normal in size.  No mediastinal or hilar masses. Linear atelectasis noted at the left lung base, stable. Lungs otherwise clear. No convincing pleural effusion and no pneumothorax. Right PICC is stable, tip in the mid superior vena cava. IMPRESSION: 1. No acute cardiopulmonary disease. 2. Status post removal of the nasal/orogastric tube since the previous exam. Electronically Signed   By: Lajean Manes M.D.   On: 05/17/2021 12:13     Anti-infectives: Anti-infectives (From admission, onward)    Start     Dose/Rate Route Frequency Ordered Stop   05/16/21 1745  meropenem (MERREM) 1 g in sodium chloride 0.9 % 100 mL IVPB        1 g 200 mL/hr over 30 Minutes Intravenous Every 8 hours 05/16/21 1655     05/13/21 1515  piperacillin-tazobactam (ZOSYN) IVPB 3.375 g  Status:  Discontinued        3.375 g 12.5 mL/hr over 240 Minutes Intravenous Every 8 hours 05/13/21 1421 05/16/21 1640   05/09/21 0915  ceFAZolin (ANCEF) IVPB 2g/100 mL premix        2 g 200 mL/hr over 30 Minutes Intravenous On call to O.R. 05/09/21 0825 05/09/21 1050       Assessment/Plan: s/p Procedure(s): LAPAROSCOPY DIAGNOSTIC (N/A) EXPLORATORY LAPAROTOMY SMALL BOWEL RESECTION POD 17 s/p open right inguinal hernia repair with mesh by Dr. Bobbye Morton for incarcerated RIH with persistent ileus vs SBO POD 9 s/p dx lap with mini laparotomy with SBR, Dr. Donne Hazel 05/09/21 - tolerating reg diet - TID WD dressing changes to midline wound.  - S/p IR drainage 8/16 for IA fluid collection. Cx's w/ Klebsiella Oxytoca and abx switched to merrem 8/19 per s/s. Recheck CT   - IS/pulm toilet - Mobilize TID in halls     FEN - DC TPN/RD/IVF per [facility] VTE - SCDs, Lovenox ID - none   New onset a fib Ascending thoracic aneurysm HLD  LOS: 17 days    Turner Daniels MD  05/19/2021  

## 2021-05-19 NOTE — Progress Notes (Addendum)
OT Cancellation Note  Patient Details Name: Gary Rice MRN: [id number] DOB: Jun 01, 1931   Cancelled Treatment:    Reason Eval/Treat Not Completed: Patient declined, no reason specified Pt declined OT session due to working with PT earlier and currently preparing for CT this afternoon. Will follow-up for OT session tomorrow.   Layla Maw 05/19/2021, 1:19 PM

## 2021-05-19 NOTE — Progress Notes (Signed)
PROGRESS NOTE    [facility]  KCM:[id number] DOB: 08-15-1931 DOA: 05/02/2021 PCP: Josetta Huddle, MD   Brief Narrative: Gary Rice is a 87 y.o. male with history of SVT, hyperlipidemia, bilateral inguinal hernias.  Patient presented secondary to vomiting and was found to have new onset atrial fibrillation with RVR in addition to mechanical SBO with transition point and associated incarcerated right inguinal hernia.  Patient underwent urgent repair with mesh placement in addition to subsequent diagnostic laparoscopy with small bowel resection and primary anastomosis.  During hospitalization, patient developed evidence of intra-abdominal infection and CT abdomen/pelvis confirmed a large pelvic abscess.  Interventional radiology was consulted for percutaneous drain placement which was performed on 8/16.   Assessment & Plan:   Principal Problem:   Pelvic abscess in male [facility]) Active Problems:   Atrial fibrillation with rapid ventricular response (HCC)   SBO (small bowel obstruction) (HCC)   Leukocytosis   Hyperlipidemia   Thoracic aortic aneurysm (HCC)   Malnutrition of moderate degree   SBO with incarcerated right inguinal hernia General surgery consulted. Patient underwent urgent repair of incarcerated hernia with mesh placement on 8/6. Diagnostic laparoscopy performed on 8/12 secondary to ongoing LLQ ischemia/microperforation and underwent open small bowel resection with primary anastomosis. NG tube in place from 8/6 and now discontinued. TPN started on 8/14 and discontinued 8/21. Diet advanced to a regular diet -General surgery recommendations: Regular diet, repeat CT scan today  Intraabdominal abscess CT abdomen/pelvis on 8/16 significant for a 12 x 11 x 10 cm consistent with a pelvic abscess. IR consulted and performed CT guided drain placement on 8/16. Wound culture (8/16) significant for bacteroides fragilis and MDR Klebsiella oxytoca -Infectious disease  recommendations: meropenem; pending today -Continue meropenem IV  Intractable nausea/vomiting In setting of above problems. NG tube placed with intermittent suction.  Resolved.  New onset atrial fibrillation with RVR In setting of above problems. Cardiology consulted. Patient stated on Cardizem IV in addition to metoprolol IV prn. Transthoracic Echocardiogram significant for normal EF. Patient is on Cardizem prn for rapid heart beat. -Continue Cardizem 120 mg TID while inpatient and transition to Cardizem 360 mg daily prior to discharge -Continue Eliquis  Dyspnea Episode last night. No current symptoms. Improved with change in position and incentive spirometry use. Some RLL rales on exam. Chest x-ray unremarkable. Resolved. Had some dyspnea on exertion while ambulating on 8/21  Hyperlipidemia Patient is on Lipitor as an outpatient. Held since no NPO -Continue Lipitor  Ascending thoracic aortic aneurysm Seen on CT imaging. 4.8 cm dilation. Recommendation for semiannual image follow-up with CTA or MRA, in addition to CT surgery follow-up.  Leukocytosis Initially secondary to SBO with incarcerated with eventual improvement. Recurrent leukocytosis in setting of intraabdominal abscess; trended down slightly after drain placement and now continues to trend down.  Hyponatremia Mild. Stable. Asymptomatic.   DVT prophylaxis: Lovenox Code Status:   Code Status: Full Code Family Communication: None at bedside Disposition Plan: Discharge to home versus SNF pending ability to tolerate transition to oral antibiotics vs plan for IV antibiotics. Likely ready for discharge in 1-2 days. Possible SNF as patient feels he has become weaker; will need continued PT/OT recs prior to discharge   Consultants:  General surgery Interventional radiology  Procedures:  OPEN RIGHT INGUINAL HERNIA REPAIR WITH MESH (05/03/2021)  TRANSTHORACIC ECHOCARDIOGRAM (05/04/2021) IMPRESSIONS     1. Left ventricular  ejection fraction, by estimation, is 60 to 65%. The  left ventricle has normal function. The left ventricle has no  regional  wall motion abnormalities. There is mild left ventricular hypertrophy.  Left ventricular diastolic parameters  are indeterminate.   2. Right ventricular systolic function is normal. The right ventricular  size is normal. Tricuspid regurgitation signal is inadequate for assessing  PA pressure.   3. The mitral valve is degenerative. Trivial mitral valve regurgitation.  No evidence of mitral stenosis.   4. The aortic valve is grossly normal. There is mild thickening of the  aortic valve. Aortic valve regurgitation is trivial. No aortic stenosis is  present.   5. The inferior vena cava is dilated in size with >50% respiratory  variability, suggesting right atrial pressure of 8 mmHg.  DIAGNOSTIC LAPAROSCOPY/OPEN SMALL BOWEL RESECTION WITH PRIMARY ANASTOMOSIS (05/09/2021)  Antimicrobials: Zosyn IV    Subjective: Some dyspnea after ambulating yesterday but otherwise no issues.  Objective: Vitals:   05/18/21 1101 05/18/21 1712 05/18/21 2315 05/19/21 0450  BP: (!) 126/54 (!) 108/59 108/66 (!) 115/92  Pulse: 67 83    Resp: 17 18 19 19   Temp: 98.3 F (36.8 C) 98.6 F (37 C) 98.7 F (37.1 C) 98.4 F (36.9 C)  TempSrc: Oral Oral Oral Oral  SpO2: 96% 94% 99% 95%  Weight:      Height:        Intake/Output Summary (Last 24 hours) at 05/19/2021 0958 Last data filed at 05/19/2021 0640 Gross per 24 hour  Intake 372.24 ml  Output 1500 ml  Net -1127.76 ml    Filed Weights   05/02/21 1805 05/17/21 1242  Weight: 77.1 kg 78.4 kg    Examination:  General exam: Appears calm and comfortable Respiratory system: Clear to auscultation. Respiratory effort normal. Cardiovascular system: S1 & S2 heard, RRR. No murmurs, rubs, gallops or clicks. Gastrointestinal system: Abdomen is distended, soft and nontender. No organomegaly or masses felt. Normal bowel sounds  heard. Central nervous system: Alert and oriented. No focal neurological deficits. Musculoskeletal: No edema. No calf tenderness Skin: No cyanosis. No rashes Psychiatry: Judgement and insight appear normal. Mood & affect appropriate.    Data Reviewed: I have personally reviewed following labs and imaging studies  CBC Lab Results  Component Value Date   WBC 20.4 (H) 05/19/2021   RBC 3.72 (L) 05/19/2021   HGB 11.5 (L) 05/19/2021   HCT 34.3 (L) 05/19/2021   MCV 92.2 05/19/2021   MCH 30.9 05/19/2021   PLT 379 05/19/2021   MCHC 33.5 05/19/2021   RDW 13.2 05/19/2021   LYMPHSABS 2.2 05/02/2021   MONOABS 2.0 (H) 05/02/2021   EOSABS 0.0 05/02/2021   BASOSABS 0.0 93/90/3009     Last metabolic panel Lab Results  Component Value Date   NA 130 (L) 05/19/2021   K 4.2 05/19/2021   CL 103 05/19/2021   CO2 21 (L) 05/19/2021   BUN 18 05/19/2021   CREATININE 0.79 05/19/2021   GLUCOSE 108 (H) 05/19/2021   GFRNONAA >60 05/19/2021   GFRAA  08/14/2007    >60        The eGFR has been calculated using the MDRD equation. This calculation has not been validated in all clinical   CALCIUM 8.1 (L) 05/19/2021   PHOS 3.0 05/16/2021   PROT 5.8 (L) 05/19/2021   ALBUMIN 1.6 (L) 05/19/2021   BILITOT 0.7 05/19/2021   ALKPHOS 149 (H) 05/19/2021   AST 113 (H) 05/19/2021   ALT 149 (H) 05/19/2021   ANIONGAP 6 05/19/2021    CBG (last 3)  Recent Labs    05/18/21 0747 05/18/21 1059 05/18/21 1714  GLUCAP 151* 151* 149*      GFR: Estimated Creatinine Clearance: 68 mL/min (by C-G formula based on SCr of 0.79 mg/dL).  Coagulation Profile: No results for input(s): INR, PROTIME in the last 168 hours.  Recent Results (from the past 240 hour(s))  Aerobic/Anaerobic Culture w Gram Stain (surgical/deep wound)     Status: None   Collection Time: 05/13/21  4:49 PM   Specimen: Fluid; Abscess  Result Value Ref Range Status   Specimen Description FLUID PELVIS  Final   Special Requests NONE  Final    Gram Stain   Final    NO SQUAMOUS EPITHELIAL CELLS SEEN RARE WBC SEEN ABUNDANT GRAM NEGATIVE RODS FEW GRAM VARIABLE ROD    Culture   Final    MODERATE KLEBSIELLA OXYTOCA ABUNDANT BACTEROIDES FRAGILIS BETA LACTAMASE POSITIVE Performed at [facility], [place] [address], [place], [place] [address]    Report Status 05/16/2021 FINAL  Final   Organism ID, Bacteria KLEBSIELLA OXYTOCA  Final      Susceptibility   Klebsiella oxytoca - MIC*    AMPICILLIN >=32 RESISTANT Resistant     CEFAZOLIN >=64 RESISTANT Resistant     CEFEPIME 1 SENSITIVE Sensitive     CEFTAZIDIME <=1 SENSITIVE Sensitive     CEFTRIAXONE 16 RESISTANT Resistant     CIPROFLOXACIN <=0.25 SENSITIVE Sensitive     GENTAMICIN <=1 SENSITIVE Sensitive     IMIPENEM <=0.25 SENSITIVE Sensitive     TRIMETH/SULFA <=20 SENSITIVE Sensitive     AMPICILLIN/SULBACTAM >=32 RESISTANT Resistant     PIP/TAZO >=128 RESISTANT Resistant     * MODERATE KLEBSIELLA OXYTOCA         Radiology Studies: DG CHEST PORT 1 VIEW  Result Date: 05/17/2021 CLINICAL DATA:  Dyspnea. EXAM: PORTABLE CHEST 1 VIEW COMPARISON:  05/15/2021 and older studies. FINDINGS: Cardiac silhouette normal in size.  No mediastinal or hilar masses. Linear atelectasis noted at the left lung base, stable. Lungs otherwise clear. No convincing pleural effusion and no pneumothorax. Right PICC is stable, tip in the mid superior vena cava. IMPRESSION: 1. No acute cardiopulmonary disease. 2. Status post removal of the nasal/orogastric tube since the previous exam. Electronically Signed   By: Lajean Manes M.D.   On: 05/17/2021 12:13        Scheduled Meds:  acetaminophen  650 mg Oral Q6H   apixaban  5 mg Oral BID   atorvastatin  10 mg Oral QHS   Chlorhexidine Gluconate Cloth  6 each Topical Daily   diltiazem  120 mg Oral Q8H   feeding supplement  1 Container Oral TID BM   pantoprazole (PROTONIX) IV  40 mg Intravenous Q24H   sodium chloride flush  10-40 mL Intracatheter  Q12H   sodium chloride flush  5 mL Intracatheter Q8H   Continuous Infusions:  lactated ringers Stopped (05/18/21 0640)   meropenem (MERREM) IV 1 g (05/19/21 0947)   methocarbamol (ROBAXIN) IV     promethazine (PHENERGAN) injection (IM or IVPB) 70 mL/hr at 05/04/21 0230     LOS: 17 days     Cordelia Poche, MD [facility] 05/19/2021, 9:58 AM  If 7PM-7AM, please contact night-coverage [email or web address]

## 2021-05-19 NOTE — Progress Notes (Signed)
Physical Therapy Treatment Patient Details Name: Gary Rice MRN: [id number] DOB: 06-09-1931 Today's Date: 05/19/2021    History of Present Illness 87 yo male admitted 8/522 after being referred to ED by PCP secondary to incarcerated inguinal hernia causing SBO, also found to be in Afib with RVR in the ED. Received surgical repair of incarcerated inguinal hernia 05/02/21. NGT placed to wall suction 8/6. Drain placement 05/13/21. PMH hernia repair joint replacement.    PT Comments    Pt received in supine, motivated to participate in PT session and with good participation and tolerance for household distance gait task using RW. Pt VSS on RA with exertion and making good progress with increased endurance/activity tolerance this date, needing up to min guard for gait/transfers. Pt too fatigued to attempt stairs this date, will plan to assess next session. Pt continues to benefit from PT services to progress toward functional mobility goals.    Follow Up Recommendations  Home health PT     Equipment Recommendations  Rolling walker with 5" wheels    Recommendations for Other Services       Precautions / Restrictions Precautions Precautions: Fall Restrictions Weight Bearing Restrictions: No    Mobility  Bed Mobility Overal bed mobility: Needs Assistance Bed Mobility: Supine to Sit     Supine to sit: Supervision     General bed mobility comments: Sup for increased time, use of rails, and safety. Encouraged log rolling but pt ignoring cues for this technique and instead pulling on B bed rails to achieve long sit then rotating hips to sit EOB    Transfers Overall transfer level: Needs assistance Equipment used: Rolling walker (2 wheeled) Transfers: Sit to/from Stand Sit to Stand: Min guard;Supervision         General transfer comment: Supervision to stand from EOB, needs 2 attempts and momentum to achieve upright; pt needs min guard to stand from lower bed height to  RW  Ambulation/Gait Ambulation/Gait assistance: Min guard Gait Distance (Feet): 200 Feet Assistive device: Rolling walker (2 wheeled) Gait Pattern/deviations: Step-through pattern;Decreased step length - right;Decreased step length - left;Decreased stride length;Trunk flexed     General Gait Details: patient with slow, steady cadence. No lob. Several brief standing rest breaks due to fatigue. Pt RW slightly higher than PTA recommends but pt refusing to have it adjusted lower, min cues for upright posture needed.   Stairs         General stair comments: too fatigued to attempt today   Wheelchair Mobility    Modified Rankin (Stroke Patients Only)       Balance Overall balance assessment: Needs assistance Sitting-balance support: Feet supported Sitting balance-Leahy Scale: Normal     Standing balance support: Bilateral upper extremity supported;During functional activity Standing balance-Leahy Scale: Fair Standing balance comment: Reliant on B UE support for steadying, Supervision for static standing and up to min guard for dynamic (when pt performs his own peri-care unsupported, etc)                            Cognition Arousal/Alertness: Awake/alert Behavior During Therapy: WFL for tasks assessed/performed Overall Cognitive Status: Within Functional Limits for tasks assessed                                 General Comments: Pt self-directed but motivated to mobilize      Exercises Other Exercises Other Exercises:  standing BLE AROM: calf stretch 30 sec ea leg Other Exercises: supine BLE AROM: ankle pumps, glute sets, quad sets, heel slides, hip abduction x10 reps ea    General Comments General comments (skin integrity, edema, etc.): VSS on RA, SpO2 92-96% with exertion      Pertinent Vitals/Pain Pain Assessment: Faces Faces Pain Scale: Hurts a little bit Pain Location: abdominal discomfort, general discomfort, left knee stiffness Pain  Descriptors / Indicators: Discomfort;Sore Pain Intervention(s): Monitored during session;Repositioned     PT Goals (current goals can now be found in the care plan section) Acute Rehab PT Goals Patient Stated Goal: To get walking more. PT Goal Formulation: With patient Time For Goal Achievement: 05/28/21 Progress towards PT goals: Progressing toward goals    Frequency    Min 3X/week      PT Plan Current plan remains appropriate       AM-PAC PT "6 Clicks" Mobility   Outcome Measure  Help needed turning from your back to your side while in a flat bed without using bedrails?: A Little Help needed moving from lying on your back to sitting on the side of a flat bed without using bedrails?: A Little Help needed moving to and from a bed to a chair (including a wheelchair)?: A Little Help needed standing up from a chair using your arms (e.g., wheelchair or bedside chair)?: A Little Help needed to walk in hospital room?: A Little Help needed climbing 3-5 steps with a railing? : A Little 6 Click Score: 18    End of Session Equipment Utilized During Treatment: Gait belt Activity Tolerance: Patient tolerated treatment well Patient left: in chair;with call bell/phone within reach;with chair alarm set Nurse Communication: Mobility status PT Visit Diagnosis: Muscle weakness (generalized) (M62.81);Difficulty in walking, not elsewhere classified (R26.2);Other abnormalities of gait and mobility (R26.89)     Time: 1121-1200 PT Time Calculation (min) (ACUTE ONLY): 39 min  Charges:  $Gait Training: 23-37 mins $Therapeutic Exercise: 8-22 mins                     Nason Conradt P., PTA Acute Rehabilitation Services Pager: [phone] Office: [place] 05/19/2021, 12:41 PM

## 2021-05-19 NOTE — Progress Notes (Signed)
Mobility Specialist: Progress Note   05/19/21 1700  Mobility  Activity Ambulated in hall  Level of Assistance Standby assist, set-up cues, supervision of patient - no hands on  Assistive Device Front wheel walker  Distance Ambulated (ft) 180 ft  Mobility Ambulated with assistance in hallway  Mobility Response Tolerated well  Mobility performed by Mobility specialist  $Mobility charge 1 Mobility   Pre-Mobility: 102 HR, 139/84 BP, 94% SpO2 Post-Mobility: 90 HR, 133/68 BP, 94% SpO2  Pt mod independent with bed mobility and contact guard during ambulation. Pt stopped for frequent brief standing breaks d/t general weakness. Pt back to bed after walk per request with call bell and phone at his side.   [facility] Jalin Erpelding Mobility Specialist Mobility Specialist Phone: [phone]

## 2021-05-19 NOTE — Progress Notes (Signed)
[facility] night shift PCU coverage note.  The nursing staff asked [place] to review CT abdomen/pelvis results.  She does show gallbladder distention with multiple gallstones with gallbladder wall thickening, edema and pericholecystic stranding.  There is bile duct dilatation which may reflect acute cholecystitis.  Tennis Must, MD.

## 2021-05-19 NOTE — Care Management Important Message (Signed)
Important Message  Patient Details  Name: Gary Rice MRN: [id number] Date of Birth: Mar 23, 1931   Medicare Important Message Given:  Yes     Shelda Altes 05/19/2021, 1:00 PM

## 2021-05-19 NOTE — Progress Notes (Signed)
Referring Physician(s):  Dr Georgena Spurling  Supervising Physician: Ruthann Cancer  Patient Status:  [facility] - In-pt  Chief Complaint:  Pelvic fluid collection Drain placed 8/16 in IR  Subjective:  Pt is up in bed reading paper Feeling better OP is minimal-- bloody fluid Flushes easily-- aspirates easily   Allergies: Patient has no known allergies.  Medications: Prior to Admission medications   Medication Sig Start Date End Date Taking? Authorizing Provider  aspirin EC 81 MG tablet Take 81 mg by mouth at bedtime.   Yes [provider]  atorvastatin (LIPITOR) 10 MG tablet Take 10 mg by mouth at bedtime. 08/14/15  Yes [provider]  diltiazem (CARDIZEM) 30 MG tablet TAKE 1 TABLET AS NEEDED FOR RAPID HEART BEAT Patient taking differently: Take 30 mg by mouth daily as needed (rapid heart beat). 10/06/17  Yes Isaiah Serge, NP  fluticasone (FLONASE) 50 MCG/ACT nasal spray Place 1 spray into both nostrils daily. 08/14/15  Yes [provider]  Multiple Vitamin (MULTIVITAMIN WITH MINERALS) TABS tablet Take 1 tablet by mouth daily.   Yes [provider]  Multiple Vitamins-Minerals (PRESERVISION AREDS 2) CAPS Take 1 capsule 2 (two) times daily by mouth.   Yes [provider]  VITAMIN E PO Take 1 capsule by mouth daily.    Yes [provider]     Vital Signs: BP (!) 115/92 (BP Location: Left Arm)   Pulse 83   Temp 98.4 F (36.9 C) (Oral)   Resp 19   Ht '5\' 11"'$  (1.803 m)   Wt 172 lb 13.5 oz (78.4 kg)   SpO2 95%   BMI 24.11 kg/m   Physical Exam Skin:    Comments: Site is clean and dry NT no bleeding OP bloody Flushes/aspirates easily  Report Status 05/16/2021 FINAL  Organism ID, Bacteria KLEBSIELLA OXYTOCA       Imaging: DG CHEST PORT 1 VIEW  Result Date: 05/17/2021 CLINICAL DATA:  Dyspnea. EXAM: PORTABLE CHEST 1 VIEW COMPARISON:  05/15/2021 and older studies. FINDINGS: Cardiac silhouette normal in size.  No  mediastinal or hilar masses. Linear atelectasis noted at the left lung base, stable. Lungs otherwise clear. No convincing pleural effusion and no pneumothorax. Right PICC is stable, tip in the mid superior vena cava. IMPRESSION: 1. No acute cardiopulmonary disease. 2. Status post removal of the nasal/orogastric tube since the previous exam. Electronically Signed   By: Lajean Manes M.D.   On: 05/17/2021 12:13    Labs:  CBC: Recent Labs    05/16/21 0241 05/17/21 0059 05/18/21 0147 05/19/21 0137  WBC 19.5* 26.5* 25.9* 20.4*  HGB 12.2* 11.7* 11.4* 11.5*  HCT 35.8* 35.2* 33.4* 34.3*  PLT 347 335 316 379    COAGS: No results for input(s): INR, APTT in the last 8760 hours.  BMP: Recent Labs    05/15/21 1027 05/16/21 0241 05/18/21 0147 05/19/21 0137  NA 133* 131* 130* 130*  K 4.0 4.0 4.3 4.2  CL 106 103 103 103  CO2 22 21* 21* 21*  GLUCOSE 134* 157* 134* 108*  BUN '14 14 18 18  '$ CALCIUM 8.1* 8.3* 8.1* 8.1*  CREATININE 0.58* 0.59* 0.61 0.79  GFRNONAA >60 >60 >60 >60    LIVER FUNCTION TESTS: Recent Labs    05/11/21 0032 05/12/21 0329 05/15/21 1027 05/19/21 0137  BILITOT 0.4 0.6 0.8 0.7  AST 20 21 78* 113*  ALT 21 19 104* 149*  ALKPHOS 41 51 99 149*  PROT 5.7* 5.5* 5.6* 5.8*  ALBUMIN 2.3* 2.0* 1.9* 1.6*    Assessment and Plan:  Pelvic absc drain intact C/d Flushes/asp easily OP bloody OP 20 cc daily For CT per [facility] today Will follow  Electronically Signed: Lavonia Drafts, PA-C 05/19/2021, 11:04 AM   I spent a total of 15 Minutes at the the patient's bedside AND on the patient's hospital floor or unit, greater than 50% of which was counseling/coordinating care for pelvic absc drain

## 2021-05-20 ENCOUNTER — Inpatient Hospital Stay (HOSPITAL_COMMUNITY): Payer: Medicare Other

## 2021-05-20 DIAGNOSIS — K56609 Unspecified intestinal obstruction, unspecified as to partial versus complete obstruction: Secondary | ICD-10-CM | POA: Diagnosis not present

## 2021-05-20 DIAGNOSIS — K829 Disease of gallbladder, unspecified: Secondary | ICD-10-CM

## 2021-05-20 DIAGNOSIS — K651 Peritoneal abscess: Secondary | ICD-10-CM | POA: Diagnosis not present

## 2021-05-20 DIAGNOSIS — K460 Unspecified abdominal hernia with obstruction, without gangrene: Secondary | ICD-10-CM | POA: Diagnosis not present

## 2021-05-20 LAB — CBC
HCT: 34.3 % — ABNORMAL LOW (ref 39.0–52.0)
Hemoglobin: 11.9 g/dL — ABNORMAL LOW (ref 13.0–17.0)
MCH: 31.6 pg (ref 26.0–34.0)
MCHC: 34.7 g/dL (ref 30.0–36.0)
MCV: 91 fL (ref 80.0–100.0)
Platelets: 389 10*3/uL (ref 150–400)
RBC: 3.77 MIL/uL — ABNORMAL LOW (ref 4.22–5.81)
RDW: 13 % (ref 11.5–15.5)
WBC: 20.2 10*3/uL — ABNORMAL HIGH (ref 4.0–10.5)
nRBC: 0 % (ref 0.0–0.2)

## 2021-05-20 LAB — COMPREHENSIVE METABOLIC PANEL
ALT: 380 U/L — ABNORMAL HIGH (ref 0–44)
AST: 354 U/L — ABNORMAL HIGH (ref 15–41)
Albumin: 1.6 g/dL — ABNORMAL LOW (ref 3.5–5.0)
Alkaline Phosphatase: 402 U/L — ABNORMAL HIGH (ref 38–126)
Anion gap: 8 (ref 5–15)
BUN: 16 mg/dL (ref 8–23)
CO2: 21 mmol/L — ABNORMAL LOW (ref 22–32)
Calcium: 8.1 mg/dL — ABNORMAL LOW (ref 8.9–10.3)
Chloride: 98 mmol/L (ref 98–111)
Creatinine, Ser: 0.67 mg/dL (ref 0.61–1.24)
GFR, Estimated: >60 mL/min (ref >60)
Glucose, Bld: 112 mg/dL — ABNORMAL HIGH (ref 70–99)
Potassium: 3.5 mmol/L (ref 3.5–5.1)
Sodium: 127 mmol/L — ABNORMAL LOW (ref 135–145)
Total Bilirubin: 3.2 mg/dL — ABNORMAL HIGH (ref 0.3–1.2)
Total Protein: 5.8 g/dL — ABNORMAL LOW (ref 6.5–8.1)

## 2021-05-20 LAB — OSMOLALITY, URINE: Osmolality, Ur: 568 mOsm/kg (ref 300–900)

## 2021-05-20 LAB — SODIUM, URINE, RANDOM: Sodium, Ur: 41 mmol/L

## 2021-05-20 LAB — CREATININE, URINE, RANDOM: Creatinine, Urine: 97.94 mg/dL

## 2021-05-20 MED ORDER — ENSURE ENLIVE PO LIQD
237.0000 mL | Freq: Three times a day (TID) | ORAL | Status: DC
  Administered 2021-05-20 – 2021-05-25 (×10): 237 mL via ORAL

## 2021-05-20 NOTE — NC FL2 (Signed)
[place] LEVEL OF CARE SCREENING TOOL     IDENTIFICATION  Patient Name: Gary Rice [facility] Birthdate: Oct 20, 1930 Sex: male Admission Date (Current Location): 05/02/2021  [facility] and [place] Number:  Herbalist and Address:  [facility], [place]on [address] [place], [place] [address]      Provider Number: [id number]  Attending Physician Name and Address:  Mariel Aloe, MD  Relative Name and Phone Number:  RAY, [name] [facility])   [id number] [facility] Phone)    Current Level of Care: Hospital Recommended Level of Care: [place] Prior Approval Number:    Date Approved/Denied:   PASRR Number: [id number]  Discharge Plan: SNF    Current Diagnoses: Patient Active Problem List   Diagnosis Date Noted   Pelvic abscess in male [facility]) 05/14/2021   Malnutrition of moderate degree 05/12/2021   Atrial fibrillation with rapid ventricular response ([place]) 05/02/2021   SBO (small bowel obstruction) ([place]) 05/02/2021   Leukocytosis 05/02/2021   Hyperlipidemia 05/02/2021   Thoracic aortic aneurysm ([place]) 05/02/2021   Incarcerated hernia     Orientation RESPIRATION BLADDER Height & Weight     Self, Time, Situation, Place  Normal Continent Weight: 172 lb 13.5 oz (78.4 kg) Height:  '5\' 11"'$  (180.3 cm)  BEHAVIORAL SYMPTOMS/MOOD NEUROLOGICAL BOWEL NUTRITION STATUS      Continent Diet (See d/c summary)  AMBULATORY STATUS COMMUNICATION OF NEEDS Skin   Extensive Assist Verbally Surgical wounds (JP Drain pelvis; incision right groin; incision abdomen)                       Personal Care Assistance Level of Assistance  Bathing, Feeding, Dressing Bathing Assistance: Maximum assistance Feeding assistance: Independent Dressing Assistance: Maximum assistance     Functional Limitations Info  Sight, Hearing, Speech Sight Info: Adequate Hearing Info: Adequate Speech Info: Adequate    SPECIAL CARE FACTORS FREQUENCY  PT (By licensed  PT), OT (By licensed OT)     PT Frequency: 5x/week OT Frequency: 5x/week            Contractures Contractures Info: Not present    Additional Factors Info  Code Status, Allergies Code Status Info: Full code Allergies Info: no known allergies           Current Medications (05/20/2021):  This is the current hospital active medication list Current Facility-Administered Medications  Medication Dose Route Frequency Provider Last Rate Last Admin   acetaminophen (TYLENOL) tablet 650 mg  650 mg Oral Q6H Mariel Aloe, MD   325 mg at 05/19/21 2207   apixaban (ELIQUIS) tablet 5 mg  5 mg Oral BID Liz Beach, RPH   5 mg at 05/20/21 1042   atorvastatin (LIPITOR) tablet 10 mg  10 mg Oral QHS Mariel Aloe, MD   10 mg at 05/19/21 2207   Chlorhexidine Gluconate Cloth 2 % PADS 6 each  6 each Topical Daily Little Ishikawa, MD   6 each at 05/20/21 1044   diltiazem (CARDIZEM) tablet 120 mg  120 mg Oral Q8H Mariel Aloe, MD   120 mg at 05/20/21 1521   feeding supplement (ENSURE ENLIVE / ENSURE PLUS) liquid 237 mL  237 mL Oral TID BM Mariel Aloe, MD   237 mL at 05/20/21 1357   lactated ringers infusion   Intravenous Continuous Bertis Ruddy, [place] [facility]   Stopped at 05/18/21 [id number]   lip balm (CARMEX) ointment   Topical PRN Little Ishikawa, MD  Given at 05/09/21 2249   meropenem (MERREM) 1 g in sodium chloride 0.9 % 100 mL IVPB  1 g Intravenous Q8H Carney, Jessica C, RPH 200 mL/hr at 05/20/21 1402 1 g at 05/20/21 1402   methocarbamol (ROBAXIN) 1,000 mg in dextrose 5 % 100 mL IVPB  1,000 mg Intravenous Q8H PRN Richard Miu H, PA-C       metoprolol tartrate (LOPRESSOR) injection 5 mg  5 mg Intravenous Q4H PRN Richard Miu H, PA-C   5 mg at 05/03/21 1334   morphine 2 MG/ML injection 1-2 mg  1-2 mg Intravenous Q2H PRN Winferd Humphrey, PA-C   2 mg at 05/12/21 0204   ondansetron (ZOFRAN) injection 4 mg  4 mg Intravenous Q6H PRN Winferd Humphrey, PA-C   4 mg at 05/08/21 2119    pantoprazole (PROTONIX) injection 40 mg  40 mg Intravenous Q24H Winferd Humphrey, PA-C   40 mg at 05/20/21 1042   phenol (CHLORASEPTIC) mouth spray 1 spray  1 spray Mouth/Throat PRN Winferd Humphrey, PA-C   1 spray at 05/03/21 1016   promethazine (PHENERGAN) 6.25 mg in sodium chloride 0.9 % 50 mL IVPB  6.25 mg Intravenous Q6H PRN Winferd Humphrey, PA-C 70 mL/hr at 05/04/21 0230 Infusion Verify at 05/04/21 0230   sodium chloride flush (NS) 0.9 % injection 10-40 mL  10-40 mL Intracatheter Q12H Little Ishikawa, MD   10 mL at 05/20/21 1044   sodium chloride flush (NS) 0.9 % injection 10-40 mL  10-40 mL Intracatheter PRN Little Ishikawa, MD       sodium chloride flush (NS) 0.9 % injection 5 mL  5 mL Intracatheter Q8H Mir, Paula Libra, MD   5 mL at 05/20/21 1357     Discharge Medications: Please see discharge summary for a list of discharge medications.  Relevant Imaging Results:  Relevant Lab Results:   Additional Information SSN [place] [phone] [place], [place]

## 2021-05-20 NOTE — Therapy (Signed)
Occupational Therapy Treatment Patient Details Name: Gary Rice MRN: [id number] DOB: 04-28-1931 Today's Date: 05/20/2021    History of present illness 87 yo male admitted 05/02/21 after being referred to ED by PCP secondary to incarcerated inguinal hernia causing SBO, also found to be in Afib with RVR in the ED. Received surgical repair of incarcerated inguinal hernia 05/02/21. NGT placed to wall suction 8/6. Drain placement 05/13/21. PMH hernia repair joint replacement.   OT comments  Pt was seen for instruction in bilateral UE strengthening HEP today. Pt was issued theraband and educated in horizontal ABD, shoulder flexion, elbow flexion/extension (biceps/triceps) to assist with overall strengthening, conditioning and overall increased activity tolerance. Pt is making slow gains in OT, goals have been extended w/ overall goal(s) of Mod I for ADL/self care and functional mobility/transfers. Will cont to follow acutely.   Follow Up Recommendations  Home health OT;Supervision - Intermittent    Equipment Recommendations  Other (comment) (RW)    Recommendations for Other Services      Precautions / Restrictions Precautions Precautions: Fall Precaution Comments: NG tube removed 05/15/21 Restrictions Weight Bearing Restrictions: No       Mobility Bed Mobility   Transfers   General transfer comment: Pt declined OOB transfers today including sitting up in chair for lunch as his lunch had just arrived.        ADL either performed or assessed with clinical judgement   ADL Overall ADL's : Needs assistance/impaired Eating/Feeding: Independent;Bed level;Sitting     General ADL Comments: Pt declined OOB activity today including transfer to chair for lunch. Pt was agreeable to instruction in HEP for bilateral UE. He was instructed in HEP using green theraband for horizontal ABD, shoulder flexion, elbow flexion and extension (biceps/triceps). Pt was educated to perform exercises  1-2x/day x10 reps each sitting up in bed, in chair or sitting EOB. Handouts were issued and reviewed. He verbalized understanding.    Cognition Arousal/Alertness: Awake/alert Behavior During Therapy: WFL for tasks assessed/performed Overall Cognitive Status: Within Functional Limits for tasks assessed     General Comments: Pt is self directed but motivated        Exercises Exercises: General Upper Extremity (Theraband HEP was issued and reviewed using green theraband sitting up at bed level.)           Pertinent Vitals/ Pain       Pain Assessment: No/denies pain   Frequency  Min 2X/week        Progress Toward Goals  OT Goals(current goals can now be found in the care plan section)  Progress towards OT goals: Progressing toward goals (Pt is making slow steady gains. Extend current goals to 06/03/21)  Acute Rehab OT Goals Patient Stated Goal: Get moving more OT Goal Formulation: With patient Time For Goal Achievement: 06/03/21 Potential to Achieve Goals: Good  Plan Discharge plan remains appropriate;Frequency remains appropriate       AM-PAC OT "6 Clicks" Daily Activity     Outcome Measure   Help from another person eating meals?: None Help from another person taking care of personal grooming?: A Little Help from another person toileting, which includes using toliet, bedpan, or urinal?: A Little Help from another person bathing (including washing, rinsing, drying)?: A Little Help from another person to put on and taking off regular upper body clothing?: A Little Help from another person to put on and taking off regular lower body clothing?: A Little 6 Click Score: 19    End of Session  OT Visit Diagnosis: Unsteadiness on feet (R26.81);Other abnormalities of gait and mobility (R26.89);Muscle weakness (generalized) (M62.81)   Activity Tolerance Patient tolerated treatment well   Patient Left in bed;with call bell/phone within reach;with bed alarm set   Nurse  Communication          Time: [id number] OT Time Calculation (min): 12 min  Charges: OT General Charges $OT Visit: 1 Visit OT Treatments $Therapeutic Exercise: 8-22 mins   Almyra Deforest, OTR/L 05/20/2021, 12:26 PM

## 2021-05-20 NOTE — Progress Notes (Signed)
11 Days Post-Op  Subjective: CC: Patient reports that yesterday after breakfast he began having pressure/pain in his upper abdomen that was worse on the right. The pain was constant. No assoc n/v. He is unsure if his pain was worse after drinking contrast. He did not eat lunch. He reports the pain resolved last night and then he tolerated liquids for dinner without abdominal pain, n/v. He has not eaten breakfast. Denies flatus or bm yesterday.   Objective: Vital signs in last 24 hours: Temp:  [97.7 F (36.5 C)-98.6 F (37 C)] 98.6 F (37 C) (08/23 0735) Pulse Rate:  [70-100] 70 (08/23 0735) Resp:  [16-20] 18 (08/23 0735) BP: (102-136)/(62-87) 122/70 (08/23 0735) SpO2:  [95 %-96 %] 96 % (08/23 0735) Last BM Date: 05/17/21  Intake/Output from previous day: 08/22 0701 - 08/23 0700 In: 355 [P.O.:240; IV Piggyback:110] Out: 2836 [Urine:2775; Drains:20; Stool:250] Intake/Output this shift: Total I/O In: -  Out: 250 [Urine:250]  PE: General: pleasant, WD, elderly male who is laying in bed in NAD Heart: regular, rate, and rhythm.   Lungs: CTAB, no wheezes, rhonchi, or rales noted.  Respiratory effort nonlabored Abd: soft, NT, mildly distended, +BS, midline wound with some fibrinous exudate in wound base and dihisced without evisceration    Lab Results:  Recent Labs    05/19/21 0137 05/20/21 0123  WBC 20.4* 20.2*  HGB 11.5* 11.9*  HCT 34.3* 34.3*  PLT 379 389   BMET Recent Labs    05/19/21 0137 05/20/21 0123  NA 130* 127*  K 4.2 3.5  CL 103 98  CO2 21* 21*  GLUCOSE 108* 112*  BUN 18 16  CREATININE 0.79 0.67  CALCIUM 8.1* 8.1*   PT/INR No results for input(s): LABPROT, INR in the last 72 hours. CMP     Component Value Date/Time   NA 127 (L) 05/20/2021 0123   K 3.5 05/20/2021 0123   CL 98 05/20/2021 0123   CO2 21 (L) 05/20/2021 0123   GLUCOSE 112 (H) 05/20/2021 0123   BUN 16 05/20/2021 0123   CREATININE 0.67 05/20/2021 0123   CALCIUM 8.1 (L) 05/20/2021  0123   PROT 5.8 (L) 05/20/2021 0123   ALBUMIN 1.6 (L) 05/20/2021 0123   AST 354 (H) 05/20/2021 0123   ALT 380 (H) 05/20/2021 0123   ALKPHOS 402 (H) 05/20/2021 0123   BILITOT 3.2 (H) 05/20/2021 0123   GFRNONAA >60 05/20/2021 0123   GFRAA  08/14/2007 0312    >60        The eGFR has been calculated using the MDRD equation. This calculation has not been validated in all clinical   Lipase     Component Value Date/Time   LIPASE 27 05/02/2021 1806    Studies/Results: CT ABDOMEN PELVIS W CONTRAST  Result Date: 05/19/2021 CLINICAL DATA:  Acute nonlocalized abdominal pain. Postop day 17 after open right inguinal hernia repair with mesh. EXAM: CT ABDOMEN AND PELVIS WITH CONTRAST TECHNIQUE: Multidetector CT imaging of the abdomen and pelvis was performed using the standard protocol following bolus administration of intravenous contrast. CONTRAST:  148m OMNIPAQUE IOHEXOL 300 MG/ML  SOLN COMPARISON:  05/13/2021 FINDINGS: Lower chest: Small right pleural effusion with basilar atelectasis, similar to prior study. Hepatobiliary: No focal liver lesions demonstrated. The gallbladder is distended with multiple stones. Gallbladder wall thickening and edema with pericholecystic stranding likely indicates acute cholecystitis. Intra and extrahepatic bile duct dilatation is present. Pancreas: Unremarkable. No pancreatic ductal dilatation or surrounding inflammatory changes. Spleen: Normal in size without focal  abnormality. Adrenals/Urinary Tract: No adrenal gland nodules. Large cyst in the upper pole left kidney. Nephrograms are otherwise homogeneous and symmetrical. No hydronephrosis or hydroureter. Bladder is unremarkable. Stomach/Bowel: The stomach, small bowel, and colon are not abnormally distended. Scattered stool throughout the colon. Left lower quadrant bowel anastomosis again demonstrated with mild hazy infiltration in the adjacent mesenteric fat. Small collection adjacent to the anastomosis measures 2.3  cm. This collection is decreased in size since the previous study and the inflammatory infiltration is decreased. Loculated collection in the pelvis anterior to the rectum is again demonstrated although significantly decreased in size since prior study, today measuring 3.4 x 6.6 cm. There has been interval placement of a pigtail drainage catheter into this collection via trans sciatic approach. The catheter pigtail appears to be centrally positioned within the residual collection. Additional small collection adjacent to the descending colon measuring 1.8 cm diameter is unchanged. No new collections are identified. Left inguinal hernia containing small bowel without proximal obstruction. No change. Vascular/Lymphatic: Aortic atherosclerosis. No enlarged abdominal or pelvic lymph nodes. Reproductive: Prostate gland is enlarged. Other: Small amount of free fluid in the abdomen, likely reactive. No free air identified. Anterior abdominal wall subcutaneous defect is likely postoperative and unchanged. Subcutaneous edema in the pelvis. Musculoskeletal: Lumbar scoliosis convex towards the right with diffuse degenerative change. Right hip arthroplasty. IMPRESSION: 1. Small right pleural effusion with basilar atelectasis. 2. Gallbladder distention. Multiple gallstones with gallbladder wall thickening and edema and pericholecystic stranding. Bile duct dilatation. Changes may indicate acute cholecystitis. 3. Pigtail drainage catheter placed in a pelvic collection. Significant interval decrease in the size of this collection with additional smaller collections unchanged. Decreased infiltrative change in the mesentery. 4. Left inguinal hernia containing small bowel without proximal obstruction, unchanged. 5. Aortic atherosclerosis. 6. Enlarged prostate gland. Electronically Signed   By: Lucienne Capers M.D.   On: 05/19/2021 18:42    Anti-infectives: Anti-infectives (From admission, onward)    Start     Dose/Rate Route  Frequency Ordered Stop   05/16/21 1745  meropenem (MERREM) 1 g in sodium chloride 0.9 % 100 mL IVPB        1 g 200 mL/hr over 30 Minutes Intravenous Every 8 hours 05/16/21 1655     05/13/21 1515  piperacillin-tazobactam (ZOSYN) IVPB 3.375 g  Status:  Discontinued        3.375 g 12.5 mL/hr over 240 Minutes Intravenous Every 8 hours 05/13/21 1421 05/16/21 1640   05/09/21 0915  ceFAZolin (ANCEF) IVPB 2g/100 mL premix        2 g 200 mL/hr over 30 Minutes Intravenous On call to O.R. 05/09/21 0825 05/09/21 1050        Assessment/Plan POD 18, s/p open right inguinal hernia repair with mesh by Dr. Bobbye Morton for incarcerated RIH with persistent ileus vs SBO POD 10, s/p dx lap with mini laparotomy with SBR, Dr. Donne Hazel 05/09/21 - S/p IR drainage 8/16 for IA fluid collection. Cx's w/ Klebsiella Oxytoca and abx switched to merrem 8/19 per s/s. CT 8/22 w/ decrease in collection size, now 3.4 x 6.6 - TID WTD for midline wound  - IS/pulm toilet - Mobilize TID in halls - Remove staples today  Elevated LFT's with possible acute cholecystitis - CT 8/22 w/ findings concerning for acute cholecystitis. He complained of new upper abdominal pain yesterday that was worse on the right but no assc n/v and he was unsure if worsened after eating. He has no pain or tenderness currently. He went for an [place]  this am that is pending. CT did show bile duct dilation that makes me concerned for choledocholithiasis with uptrending LFT's (T. Bili of 3.2.). Will touch base with GI to see if they would like to evaluate vs getting MRCP first. He is already on Merrem. If patient truly has acute cholecystitis would favor tx w/ abx alone vs perc chole drain given how close he is out from 2 recent surgeries listed above.   FEN - NPO for GI eval.  VTE - SCDs, Eliquis  ID - Merrem   New onset a fib - on eliquis and cardizem Ascending thoracic aneurysm HLD   LOS: 18 days    Jillyn Ledger , [facility]  Surgery 05/20/2021, 10:18 AM Please see Amion for pager number during day hours 7:00am-4:30pm

## 2021-05-20 NOTE — Progress Notes (Signed)
[facility]  Date of Admission:  05/02/2021     Total days of antibiotics 9         ASSESSMENT:  Mr. Walt CT abdomen/pelvis from 05/19/21 with significant decrease in the size of abscess with smaller collections remaining unchanged. There was concern for gall bladder distention and possibility of acute cholecystitis which was confirmed on ultrasound this morning. General Surgery recommending gall bladder drain as he is not a good surgical candidate due to overall poor health and debilitated state. Discussed plan of care to continue current dose of meropenem. Treatment for cholecystitis per GI/General Surgery recommendations.   PLAN:  Continue current dose of meropenem.  Drain management per IR. Acute cholecystitis management per GI/General Surgery recommendations. Remaining supportive care per primary team.   Principal Problem:   Pelvic abscess in male [facility]) Active Problems:   Atrial fibrillation with rapid ventricular response (HCC)   SBO (small bowel obstruction) (HCC)   Leukocytosis   Hyperlipidemia   Thoracic aortic aneurysm (HCC)   Malnutrition of moderate degree    acetaminophen  650 mg Oral Q6H   apixaban  5 mg Oral BID   atorvastatin  10 mg Oral QHS   Chlorhexidine Gluconate Cloth  6 each Topical Daily   diltiazem  120 mg Oral Q8H   feeding supplement  1 Container Oral TID BM   pantoprazole (PROTONIX) IV  40 mg Intravenous Q24H   sodium chloride flush  10-40 mL Intracatheter Q12H   sodium chloride flush  5 mL Intracatheter Q8H    SUBJECTIVE:  Afebrile overnight. CT abdomen/pelvis with concern for acute cholecystitis. Initially having increased gas pain and decreased urinary output. Increased oral intake and now having improved symptoms with good urine output. Denies fevers/chills.   No Known Allergies   Review of Systems: Review of Systems  Constitutional:  Negative for chills, fever and weight loss.  Respiratory:  Negative  for cough, shortness of breath and wheezing.   Cardiovascular:  Negative for chest pain and leg swelling.  Gastrointestinal:  Negative for abdominal pain, constipation, diarrhea, nausea and vomiting.  Skin:  Negative for rash.     OBJECTIVE: Vitals:   05/19/21 2104 05/19/21 2359 05/20/21 0351 05/20/21 0735  BP: 136/87 122/79 120/62 122/70  Pulse: 85 100 80 70  Resp: '20 16 20 18  '$ Temp: 98.3 F (36.8 C) 97.7 F (36.5 C) 97.7 F (36.5 C) 98.6 F (37 C)  TempSrc: Oral Oral Oral Oral  SpO2: 96% 95% 96% 96%  Weight:      Height:       Body mass index is 24.11 kg/m.  Physical Exam Constitutional:      General: He is not in acute distress.    Appearance: He is well-developed.  Cardiovascular:     Rate and Rhythm: Normal rate and regular rhythm.     Heart sounds: Normal heart sounds.  Pulmonary:     Effort: Pulmonary effort is normal.     Breath sounds: Normal breath sounds.  Abdominal:     General: There is no distension.     Tenderness: There is no abdominal tenderness. There is no guarding or rebound.     Comments: JP drain with scant amount of serosanguinous drainage.   Skin:    General: Skin is warm and dry.  Neurological:     Mental Status: He is alert and oriented to person, place, and time.  Psychiatric:        Behavior: Behavior normal.  Thought Content: Thought content normal.        Judgment: Judgment normal.    Lab Results Lab Results  Component Value Date   WBC 20.2 (H) 05/20/2021   HGB 11.9 (L) 05/20/2021   HCT 34.3 (L) 05/20/2021   MCV 91.0 05/20/2021   PLT 389 05/20/2021    Lab Results  Component Value Date   CREATININE 0.67 05/20/2021   BUN 16 05/20/2021   NA 127 (L) 05/20/2021   K 3.5 05/20/2021   CL 98 05/20/2021   CO2 21 (L) 05/20/2021    Lab Results  Component Value Date   ALT 380 (H) 05/20/2021   AST 354 (H) 05/20/2021   ALKPHOS 402 (H) 05/20/2021   BILITOT 3.2 (H) 05/20/2021     Microbiology: Recent Results (from the  past 240 hour(s))  Aerobic/Anaerobic Culture w Gram Stain (surgical/deep wound)     Status: None   Collection Time: 05/13/21  4:49 PM   Specimen: Fluid; Abscess  Result Value Ref Range Status   Specimen Description FLUID PELVIS  Final   Special Requests NONE  Final   Gram Stain   Final    NO SQUAMOUS EPITHELIAL CELLS SEEN RARE WBC SEEN ABUNDANT GRAM NEGATIVE RODS FEW GRAM VARIABLE ROD    Culture   Final    MODERATE KLEBSIELLA OXYTOCA ABUNDANT BACTEROIDES FRAGILIS BETA LACTAMASE POSITIVE Performed at [facility], [place] [address] [place], [place] [address]    Report Status 05/16/2021 FINAL  Final   Organism ID, Bacteria KLEBSIELLA OXYTOCA  Final      Susceptibility   Klebsiella oxytoca - MIC*    AMPICILLIN >=32 RESISTANT Resistant     CEFAZOLIN >=64 RESISTANT Resistant     CEFEPIME 1 SENSITIVE Sensitive     CEFTAZIDIME <=1 SENSITIVE Sensitive     CEFTRIAXONE 16 RESISTANT Resistant     CIPROFLOXACIN <=0.25 SENSITIVE Sensitive     GENTAMICIN <=1 SENSITIVE Sensitive     IMIPENEM <=0.25 SENSITIVE Sensitive     TRIMETH/SULFA <=20 SENSITIVE Sensitive     AMPICILLIN/SULBACTAM >=32 RESISTANT Resistant     PIP/TAZO >=128 RESISTANT Resistant     * MODERATE KLEBSIELLA OXYTOCA     Terri Piedra, NP [facility]  05/20/2021  11:33 AM

## 2021-05-20 NOTE — Progress Notes (Signed)
1 staple removed from right abdomen. Site clean and dry.

## 2021-05-20 NOTE — Progress Notes (Signed)
Physical Therapy Treatment Patient Details Name: Gary Rice MRN: [id number] DOB: 05-17-1931 Today's Date: 05/20/2021    History of Present Illness 87 yo male admitted 8/522 after being referred to ED by PCP secondary to incarcerated inguinal hernia causing SBO, also found to be in Afib with RVR in the ED. Received surgical repair of incarcerated inguinal hernia 05/02/21. NGT placed to wall suction 8/6. Drain placement 05/13/21. PMH hernia repair joint replacement.    PT Comments    Pt supine in bed on entry, reports feeling better after eating and agreeable to work on steps. Pt making good progress towards his goals however in limited in safe mobility by DoE, in presence of decreased strength, and endurance. Pt is currently supervision for bed mobility, and min guard for transfers, short distance ambulation and stepping up and down single step. D/c plan remains appropriate at this time. PT will continue to follow acutely.   Follow Up Recommendations  Home health PT     Equipment Recommendations  Rolling walker with 5" wheels       Precautions / Restrictions Precautions Precautions: Fall Precaution Comments: NG tube removed 05/15/21 Restrictions Weight Bearing Restrictions: No    Mobility  Bed Mobility Overal bed mobility: Needs Assistance Bed Mobility: Supine to Sit     Supine to sit: Supervision     General bed mobility comments: supervision for safety, HoB elevated, minimal use of rails    Transfers Overall transfer level: Needs assistance Equipment used: Rolling walker (2 wheeled) Transfers: Sit to/from Stand Sit to Stand: Min guard         General transfer comment: min guard for safety, requires 2x attempts due to hips too far back in the bed, good power up and self steady, heavy use of handrails to get up from toilet.  Ambulation/Gait Ambulation/Gait assistance: Min guard Gait Distance (Feet): 20 Feet Assistive device: Rolling walker (2 wheeled) Gait  Pattern/deviations: Step-through pattern;Decreased step length - right;Decreased step length - left;Decreased stride length;Trunk flexed Gait velocity: decr Gait velocity interpretation: <1.31 ft/sec, indicative of household ambulator General Gait Details: slow, steady gait to and from bathroom with RW,   Stairs Stairs: Yes Stairs assistance: Min guard Stair Management: Backwards;Forwards;With walker Number of Stairs: 1 (x6) General stair comments: able to step up x 6 to RW over single step leading with RLE,         Balance Overall balance assessment: Needs assistance Sitting-balance support: Feet supported Sitting balance-Leahy Scale: Normal     Standing balance support: Bilateral upper extremity supported;During functional activity Standing balance-Leahy Scale: Fair Standing balance comment: Reliant on B UE support for steadying, Supervision for static standing and up to min guard for dynamic (when pt performs his own peri-care unsupported, etc)                            Cognition Arousal/Alertness: Awake/alert Behavior During Therapy: WFL for tasks assessed/performed Overall Cognitive Status: Within Functional Limits for tasks assessed                                 General Comments: Pt is self directed but motivated         General Comments General comments (skin integrity, edema, etc.): VSS on RA      Pertinent Vitals/Pain Pain Assessment: No/denies pain     PT Goals (current goals can now be found in the care  plan section) Acute Rehab PT Goals Patient Stated Goal: To get walking more. PT Goal Formulation: With patient Time For Goal Achievement: 05/28/21 Progress towards PT goals: Progressing toward goals    Frequency    Min 3X/week      PT Plan Current plan remains appropriate       AM-PAC PT "6 Clicks" Mobility   Outcome Measure  Help needed turning from your back to your side while in a flat bed without using  bedrails?: A Little Help needed moving from lying on your back to sitting on the side of a flat bed without using bedrails?: A Little Help needed moving to and from a bed to a chair (including a wheelchair)?: A Little Help needed standing up from a chair using your arms (e.g., wheelchair or bedside chair)?: A Little Help needed to walk in hospital room?: A Little Help needed climbing 3-5 steps with a railing? : A Little 6 Click Score: 18    End of Session Equipment Utilized During Treatment: Gait belt Activity Tolerance: Patient tolerated treatment well Patient left: in chair;with call bell/phone within reach;with chair alarm set Nurse Communication: Mobility status PT Visit Diagnosis: Muscle weakness (generalized) (M62.81);Difficulty in walking, not elsewhere classified (R26.2);Other abnormalities of gait and mobility (R26.89)     Time: 1315-1350 PT Time Calculation (min) (ACUTE ONLY): 35 min  Charges:  $Gait Training: 8-22 mins $Therapeutic Activity: 8-22 mins                     Danarius Mcconathy B. Migdalia Dk PT, DPT Acute Rehabilitation Services Pager [phone] Office [phone]    [place] 05/20/2021, 3:37 PM

## 2021-05-20 NOTE — Progress Notes (Signed)
PROGRESS NOTE    [name] [facility]  HDQ:[id number] DOB: Apr 23, 1931 DOA: 05/02/2021 PCP: Josetta Huddle, MD   Brief Narrative: Gary Rice is a 87 y.o. male with history of SVT, hyperlipidemia, bilateral inguinal hernias.  Patient presented secondary to vomiting and was found to have new onset atrial fibrillation with RVR in addition to mechanical SBO with transition point and associated incarcerated right inguinal hernia.  Patient underwent urgent repair with mesh placement in addition to subsequent diagnostic laparoscopy with small bowel resection and primary anastomosis.  During hospitalization, patient developed evidence of intra-abdominal infection and CT abdomen/pelvis confirmed a large pelvic abscess.  Interventional radiology was consulted for percutaneous drain placement which was performed on 8/16.   Assessment & Plan:   Principal Problem:   Pelvic abscess in male [facility]) Active Problems:   Atrial fibrillation with rapid ventricular response (HCC)   SBO (small bowel obstruction) (HCC)   Leukocytosis   Hyperlipidemia   Thoracic aortic aneurysm (HCC)   Malnutrition of moderate degree   SBO with incarcerated right inguinal hernia General surgery consulted. Patient underwent urgent repair of incarcerated hernia with mesh placement on 8/6. Diagnostic laparoscopy performed on 8/12 secondary to ongoing LLQ ischemia/microperforation and underwent open small bowel resection with primary anastomosis. NG tube in place from 8/6 and now discontinued. TPN started on 8/14 and discontinued 8/21. Diet advanced to a regular diet -General surgery recommendations: Regular diet  Acute cholecystitis Gallbladder distension with sludge/stones noted on CT/US; also concern for possible choledocholithiasis as evidenced by biliary ductal dilation. General surgery have consulted GI. Will likely require a percutaneous drain placed as unlikely patient will be a surgical candidate at this time for  cholecystectomy -GI recommendations: pending -Already on meropenem as mentioned below  Intraabdominal abscess CT abdomen/pelvis on 8/16 significant for a 12 x 11 x 10 cm consistent with a pelvic abscess. IR consulted and performed CT guided drain placement on 8/16. Wound culture (8/16) significant for bacteroides fragilis and MDR Klebsiella oxytoca. Repeat CT scan with decrease in size of abdominal abscess but with additional smaller collections unchanged. -Infectious disease recommendations: meropenem IV -Continue meropenem IV  Intractable nausea/vomiting In setting of above problems. NG tube placed with intermittent suction.  Resolved.  New onset atrial fibrillation with RVR In setting of above problems. Cardiology consulted. Patient stated on Cardizem IV in addition to metoprolol IV prn. Transthoracic Echocardiogram significant for normal EF. Patient is on Cardizem prn for rapid heart beat. -Continue Cardizem 120 mg TID while inpatient and transition to Cardizem 360 mg daily prior to discharge -Continue Eliquis  Dyspnea Episode last night. No current symptoms. Improved with change in position and incentive spirometry use. Some RLL rales on exam. Chest x-ray unremarkable. Resolved. Had some dyspnea on exertion while ambulating on 8/21  Hyperlipidemia Patient is on Lipitor as an outpatient. Held since no NPO -Continue Lipitor  Ascending thoracic aortic aneurysm Seen on CT imaging. 4.8 cm dilation. Recommendation for semiannual image follow-up with CTA or MRA, in addition to CT surgery follow-up.  Leukocytosis Initially secondary to SBO with incarcerated with eventual improvement. Recurrent leukocytosis in setting of intraabdominal abscess; trended down slightly after drain placement and now continues to trend down.  Hyponatremia Mild. Stable. Asymptomatic.   DVT prophylaxis: Lovenox Code Status:   Code Status: Full Code Family Communication: None at bedside Disposition Plan:  Discharge likely to SNF. PT/OT recommending home health but patient will likely need SNF for complex wound care, drain management, likely IV antibiotics. Discharge not for several  days as patient has no cholecystitis.   Consultants:  General surgery Interventional radiology  Procedures:  OPEN RIGHT INGUINAL HERNIA REPAIR WITH MESH (05/03/2021)  TRANSTHORACIC ECHOCARDIOGRAM (05/04/2021) IMPRESSIONS     1. Left ventricular ejection fraction, by estimation, is 60 to 65%. The  left ventricle has normal function. The left ventricle has no regional  wall motion abnormalities. There is mild left ventricular hypertrophy.  Left ventricular diastolic parameters  are indeterminate.   2. Right ventricular systolic function is normal. The right ventricular  size is normal. Tricuspid regurgitation signal is inadequate for assessing  PA pressure.   3. The mitral valve is degenerative. Trivial mitral valve regurgitation.  No evidence of mitral stenosis.   4. The aortic valve is grossly normal. There is mild thickening of the  aortic valve. Aortic valve regurgitation is trivial. No aortic stenosis is  present.   5. The inferior vena cava is dilated in size with >50% respiratory  variability, suggesting right atrial pressure of 8 mmHg.  DIAGNOSTIC LAPAROSCOPY/OPEN SMALL BOWEL RESECTION WITH PRIMARY ANASTOMOSIS (05/09/2021)  Antimicrobials: Zosyn IV    Subjective: Patient reports RUQ pain but states this is "gas pain." No other concerns.  Objective: Vitals:   05/19/21 2104 05/19/21 2359 05/20/21 0351 05/20/21 0735  BP: 136/87 122/79 120/62 122/70  Pulse: 85 100 80 70  Resp: _0 Temp: 98.3 F (36.8 C) 97.7 F (36.5 C) 97.7 F (36.5 C) 98.6 F (37 C)  TempSrc: Oral Oral Oral Oral  SpO2: 96% 95% 96% 96%  Weight:      Height:        Intake/Output Summary (Last 24 hours) at 05/20/2021 0744 Last data filed at 05/20/2021 0735 Gross per 24 hour  Intake 355 ml  Output 3295 ml  Net  -2940 ml    Filed Weights   05/02/21 1805 05/17/21 1242  Weight: 77.1 kg 78.4 kg    Examination:  General exam: Appears calm and comfortable Respiratory system: Clear to auscultation. Respiratory effort normal. Cardiovascular system: S1 & S2 heard Gastrointestinal system: Abdomen is slightly distended, soft and nontender. No organomegaly or masses felt. Normal bowel sounds heard. Central nervous system: Alert and oriented. No focal neurological deficits. Musculoskeletal: No edema. No calf tenderness Skin: No cyanosis. No rashes Psychiatry: Judgement and insight appear normal. Mood & affect appropriate.    Data Reviewed: I have personally reviewed following labs and imaging studies  CBC Lab Results  Component Value Date   WBC 20.2 (H) 05/20/2021   RBC 3.77 (L) 05/20/2021   HGB 11.9 (L) 05/20/2021   HCT 34.3 (L) 05/20/2021   MCV 91.0 05/20/2021   MCH 31.6 05/20/2021   PLT 389 05/20/2021   MCHC 34.7 05/20/2021   RDW 13.0 05/20/2021   LYMPHSABS 2.2 05/02/2021   MONOABS 2.0 (H) 05/02/2021   EOSABS 0.0 05/02/2021   BASOSABS 0.0 00/86/7619     Last metabolic panel Lab Results  Component Value Date   NA 127 (L) 05/20/2021   K 3.5 05/20/2021   CL 98 05/20/2021   CO2 21 (L) 05/20/2021   BUN 16 05/20/2021   CREATININE 0.67 05/20/2021   GLUCOSE 112 (H) 05/20/2021   GFRNONAA >60 05/20/2021   GFRAA  08/14/2007    >60        The eGFR has been calculated using the MDRD equation. This calculation has not been validated in all clinical   CALCIUM 8.1 (L) 05/20/2021   PHOS 3.0 05/16/2021   PROT 5.8 (L) 05/20/2021  ALBUMIN 1.6 (L) 05/20/2021   BILITOT 3.2 (H) 05/20/2021   ALKPHOS 402 (H) 05/20/2021   AST 354 (H) 05/20/2021   ALT 380 (H) 05/20/2021   ANIONGAP 8 05/20/2021    CBG (last 3)  Recent Labs    05/18/21 0747 05/18/21 1059 05/18/21 1714  GLUCAP 151* 151* 149*      GFR: Estimated Creatinine Clearance: 68 mL/min (by C-G formula based on SCr of 0.67  mg/dL).  Coagulation Profile: No results for input(s): INR, PROTIME in the last 168 hours.  Recent Results (from the past 240 hour(s))  Aerobic/Anaerobic Culture w Gram Stain (surgical/deep wound)     Status: None   Collection Time: 05/13/21  4:49 PM   Specimen: Fluid; Abscess  Result Value Ref Range Status   Specimen Description FLUID PELVIS  Final   Special Requests NONE  Final   Gram Stain   Final    NO SQUAMOUS EPITHELIAL CELLS SEEN RARE WBC SEEN ABUNDANT GRAM NEGATIVE RODS FEW GRAM VARIABLE ROD    Culture   Final    MODERATE KLEBSIELLA OXYTOCA ABUNDANT BACTEROIDES FRAGILIS BETA LACTAMASE POSITIVE Performed at [facility], [place] [address], [place], [place] [address]    Report Status 05/16/2021 FINAL  Final   Organism ID, Bacteria KLEBSIELLA OXYTOCA  Final      Susceptibility   Klebsiella oxytoca - MIC*    AMPICILLIN >=32 RESISTANT Resistant     CEFAZOLIN >=64 RESISTANT Resistant     CEFEPIME 1 SENSITIVE Sensitive     CEFTAZIDIME <=1 SENSITIVE Sensitive     CEFTRIAXONE 16 RESISTANT Resistant     CIPROFLOXACIN <=0.25 SENSITIVE Sensitive     GENTAMICIN <=1 SENSITIVE Sensitive     IMIPENEM <=0.25 SENSITIVE Sensitive     TRIMETH/SULFA <=20 SENSITIVE Sensitive     AMPICILLIN/SULBACTAM >=32 RESISTANT Resistant     PIP/TAZO >=128 RESISTANT Resistant     * MODERATE KLEBSIELLA OXYTOCA         Radiology Studies: CT ABDOMEN PELVIS W CONTRAST  Result Date: 05/19/2021 CLINICAL DATA:  Acute nonlocalized abdominal pain. Postop day 17 after open right inguinal hernia repair with mesh. EXAM: CT ABDOMEN AND PELVIS WITH CONTRAST TECHNIQUE: Multidetector CT imaging of the abdomen and pelvis was performed using the standard protocol following bolus administration of intravenous contrast. CONTRAST:  162m OMNIPAQUE IOHEXOL 300 MG/ML  SOLN COMPARISON:  05/13/2021 FINDINGS: Lower chest: Small right pleural effusion with basilar atelectasis, similar to prior study. Hepatobiliary: No  focal liver lesions demonstrated. The gallbladder is distended with multiple stones. Gallbladder wall thickening and edema with pericholecystic stranding likely indicates acute cholecystitis. Intra and extrahepatic bile duct dilatation is present. Pancreas: Unremarkable. No pancreatic ductal dilatation or surrounding inflammatory changes. Spleen: Normal in size without focal abnormality. Adrenals/Urinary Tract: No adrenal gland nodules. Large cyst in the upper pole left kidney. Nephrograms are otherwise homogeneous and symmetrical. No hydronephrosis or hydroureter. Bladder is unremarkable. Stomach/Bowel: The stomach, small bowel, and colon are not abnormally distended. Scattered stool throughout the colon. Left lower quadrant bowel anastomosis again demonstrated with mild hazy infiltration in the adjacent mesenteric fat. Small collection adjacent to the anastomosis measures 2.3 cm. This collection is decreased in size since the previous study and the inflammatory infiltration is decreased. Loculated collection in the pelvis anterior to the rectum is again demonstrated although significantly decreased in size since prior study, today measuring 3.4 x 6.6 cm. There has been interval placement of a pigtail drainage catheter into this collection via trans sciatic approach. The  catheter pigtail appears to be centrally positioned within the residual collection. Additional small collection adjacent to the descending colon measuring 1.8 cm diameter is unchanged. No new collections are identified. Left inguinal hernia containing small bowel without proximal obstruction. No change. Vascular/Lymphatic: Aortic atherosclerosis. No enlarged abdominal or pelvic lymph nodes. Reproductive: Prostate gland is enlarged. Other: Small amount of free fluid in the abdomen, likely reactive. No free air identified. Anterior abdominal wall subcutaneous defect is likely postoperative and unchanged. Subcutaneous edema in the pelvis.  Musculoskeletal: Lumbar scoliosis convex towards the right with diffuse degenerative change. Right hip arthroplasty. IMPRESSION: 1. Small right pleural effusion with basilar atelectasis. 2. Gallbladder distention. Multiple gallstones with gallbladder wall thickening and edema and pericholecystic stranding. Bile duct dilatation. Changes may indicate acute cholecystitis. 3. Pigtail drainage catheter placed in a pelvic collection. Significant interval decrease in the size of this collection with additional smaller collections unchanged. Decreased infiltrative change in the mesentery. 4. Left inguinal hernia containing small bowel without proximal obstruction, unchanged. 5. Aortic atherosclerosis. 6. Enlarged prostate gland. Electronically Signed   By: Lucienne Capers M.D.   On: 05/19/2021 18:42        Scheduled Meds:  acetaminophen  650 mg Oral Q6H   apixaban  5 mg Oral BID   atorvastatin  10 mg Oral QHS   Chlorhexidine Gluconate Cloth  6 each Topical Daily   diltiazem  120 mg Oral Q8H   feeding supplement  1 Container Oral TID BM   pantoprazole (PROTONIX) IV  40 mg Intravenous Q24H   sodium chloride flush  10-40 mL Intracatheter Q12H   sodium chloride flush  5 mL Intracatheter Q8H   Continuous Infusions:  lactated ringers Stopped (05/18/21 0640)   meropenem (MERREM) IV 1 g (05/20/21 0528)   methocarbamol (ROBAXIN) IV     promethazine (PHENERGAN) injection (IM or IVPB) 70 mL/hr at 05/04/21 0230     LOS: 18 days     Cordelia Poche, MD [facility] 05/20/2021, 7:44 AM  If 7PM-7AM, please contact night-coverage [email or web address]

## 2021-05-20 NOTE — Consult Note (Signed)
[facility] Consultation Note  Referring Provider: [facility] Primary Care Physician:  Josetta Huddle, MD  Reason for Consultation:  gallstones, elevated LFTs  HPI: Gary Rice is a 87 y.o. male who is about 3 weeks' out from initial surgery for incarcerated right inguinal hernia, complicated by SBO vs ileus as well as repeat laparotomy.  Developed pelvic abscess which is being drained.  Now with new upper abdominal pain and elevated LFTs and dilated bile duct.  Imaging and clinical features suggestive of acute cholecystitis.  Pain has resolved after passage of lots of flatus.  Is on regular diet without issues.   History reviewed. No pertinent past medical history.  Past Surgical History:  Procedure Laterality Date   APPENDECTOMY     age 48   BOWEL RESECTION  05/09/2021   Procedure: SMALL BOWEL RESECTION;  Surgeon: Rolm Bookbinder, MD;  Location: [place];  Service: General;;   CATARACT EXTRACTION, BILATERAL     HERNIA REPAIR     RIH   INGUINAL HERNIA REPAIR Right 05/02/2021   Procedure: REPAIR  INCARCERATED INGUINAL HERNIA  WITH MESH;  Surgeon: Jesusita Oka, MD;  Location: [place];  Service: General;  Laterality: Right;   JOINT REPLACEMENT Right    RTHA   LAPAROSCOPY N/A 05/09/2021   Procedure: LAPAROSCOPY DIAGNOSTIC;  Surgeon: Rolm Bookbinder, MD;  Location: [place];  Service: General;  Laterality: N/A;   LAPAROTOMY  05/09/2021   Procedure: EXPLORATORY LAPAROTOMY;  Surgeon: Rolm Bookbinder, MD;  Location: [place];  Service: General;;   TONSILLECTOMY      Prior to Admission medications   Medication Sig Start Date End Date Taking? Authorizing Provider  aspirin EC 81 MG tablet Take 81 mg by mouth at bedtime.   Yes [provider]  atorvastatin (LIPITOR) 10 MG tablet Take 10 mg by mouth at bedtime. 08/14/15  Yes [provider]  diltiazem (CARDIZEM) 30 MG tablet TAKE 1 TABLET AS NEEDED FOR RAPID HEART BEAT Patient taking  differently: Take 30 mg by mouth daily as needed (rapid heart beat). 10/06/17  Yes Isaiah Serge, NP  fluticasone (FLONASE) 50 MCG/ACT nasal spray Place 1 spray into both nostrils daily. 08/14/15  Yes [provider]  Multiple Vitamin (MULTIVITAMIN WITH MINERALS) TABS tablet Take 1 tablet by mouth daily.   Yes [provider]  Multiple Vitamins-Minerals (PRESERVISION AREDS 2) CAPS Take 1 capsule 2 (two) times daily by mouth.   Yes [provider]  VITAMIN E PO Take 1 capsule by mouth daily.    Yes [provider]    Current Facility-Administered Medications  Medication Dose Route Frequency Provider Last Rate Last Admin   acetaminophen (TYLENOL) tablet 650 mg  650 mg Oral Q6H Mariel Aloe, MD   325 mg at 05/19/21 2207   apixaban (ELIQUIS) tablet 5 mg  5 mg Oral BID Liz Beach, RPH   5 mg at 05/20/21 1042   atorvastatin (LIPITOR) tablet 10 mg  10 mg Oral QHS Mariel Aloe, MD   10 mg at 05/19/21 2207   Chlorhexidine Gluconate Cloth 2 % PADS 6 each  6 each Topical Daily Little Ishikawa, MD   6 each at 05/20/21 1044   diltiazem (CARDIZEM) tablet 120 mg  120 mg Oral Q8H Mariel Aloe, MD   120 mg at 05/20/21 0529   feeding supplement (ENSURE ENLIVE / ENSURE PLUS) liquid 237 mL  237 mL Oral TID BM Mariel Aloe, MD   237 mL  at 05/20/21 1357   lactated ringers infusion   Intravenous Continuous Bertis Ruddy, [facility]   Stopped at 05/18/21 [id number]   lip balm (CARMEX) ointment   Topical PRN Little Ishikawa, MD   Given at 05/09/21 2249   meropenem (MERREM) 1 g in sodium chloride 0.9 % 100 mL IVPB  1 g Intravenous Q8H Carney, Jessica C, RPH 200 mL/hr at 05/20/21 1402 1 g at 05/20/21 1402   methocarbamol (ROBAXIN) 1,000 mg in dextrose 5 % 100 mL IVPB  1,000 mg Intravenous Q8H PRN Winferd Humphrey, PA-C       metoprolol tartrate (LOPRESSOR) injection 5 mg  5 mg Intravenous Q4H PRN Winferd Humphrey, PA-C   5 mg at 05/03/21 1334   morphine 2 MG/ML  injection 1-2 mg  1-2 mg Intravenous Q2H PRN Winferd Humphrey, PA-C   2 mg at 05/12/21 [id number]   ondansetron (ZOFRAN) injection 4 mg  4 mg Intravenous Q6H PRN Winferd Humphrey, PA-C   4 mg at 05/08/21 2119   pantoprazole (PROTONIX) injection 40 mg  40 mg Intravenous Q24H Winferd Humphrey, PA-C   40 mg at 05/20/21 1042   phenol (CHLORASEPTIC) mouth spray 1 spray  1 spray Mouth/Throat PRN Winferd Humphrey, PA-C   1 spray at 05/03/21 1016   promethazine (PHENERGAN) 6.25 mg in sodium chloride 0.9 % 50 mL IVPB  6.25 mg Intravenous Q6H PRN Winferd Humphrey, PA-C 70 mL/hr at 05/04/21 0230 Infusion Verify at 05/04/21 0230   sodium chloride flush (NS) 0.9 % injection 10-40 mL  10-40 mL Intracatheter Q12H Little Ishikawa, MD   10 mL at 05/20/21 1044   sodium chloride flush (NS) 0.9 % injection 10-40 mL  10-40 mL Intracatheter PRN Little Ishikawa, MD       sodium chloride flush (NS) 0.9 % injection 5 mL  5 mL Intracatheter Q8H Mir, Paula Libra, MD   5 mL at 05/20/21 1357    Allergies as of 05/02/2021   (No Known Allergies)    Family History  Problem Relation Age of Onset   Heart disease Father    Heart attack Father    Heart attack Son    Sleep apnea Son     Social History   Socioeconomic History   Marital status: Married    Spouse name: Not on file   Number of children: Not on file   Years of education: Not on file   Highest education level: Not on file  Occupational History   Not on file  Tobacco Use   Smoking status: Former    Types: Cigarettes    Quit date: 10/24/1950    Years since quitting: 70.6   Smokeless tobacco: Never  Vaping Use   Vaping Use: Never used  Substance and Sexual Activity   Alcohol use: No   Drug use: No   Sexual activity: Not on file  Other Topics Concern   Not on file  Social History Narrative   Not on file   Social Determinants of Health   Financial Resource Strain: Not on file  Food Insecurity: Not on file  Transportation Needs: Not on  file  Physical Activity: Not on file  Stress: Not on file  Social Connections: Not on file  Intimate Partner Violence: Not on file    Review of Systems: As per HPI, all others negative  Physical Exam: Vital signs in last 24 hours: Temp:  [97.7 F (36.5 C)-98.6 F (37 C)] 97.7 F (36.5  C) (08/23 1400) Pulse Rate:  [70-100] 80 (08/23 1400) Resp:  [16-20] 18 (08/23 1400) BP: (95-136)/(60-87) 95/60 (08/23 1400) SpO2:  [95 %-96 %] 96 % (08/23 0735) Last BM Date: 05/18/21 General:   Alert,  Well-developed, well-nourished, pleasant and cooperative in NAD Head:  Normocephalic and atraumatic. Eyes:  Sclera clear, no icterus.   Conjunctiva pink. Ears:  Normal auditory acuity. Nose:  No deformity, discharge,  or lesions. Mouth:  No deformity or lesions.  Oropharynx pink & moist. Neck:  Supple; no masses or thyromegaly. Lungs:  Clear throughout to auscultation.   No wheezes, crackles, or rhonchi. No acute distress. Heart:  Regular rate and rhythm; no murmurs, clicks, rubs,  or gallops. Abdomen:  Soft, nontender and nondistended. No masses, hepatosplenomegaly or hernias noted. Normal bowel sounds, without guarding, and without rebound.     Msk:  Symmetrical without gross deformities. Normal posture. Pulses:  Normal pulses noted. Extremities:  Without clubbing or edema. Neurologic:  Alert and  oriented x4;  grossly normal neurologically. Skin:  Intact without significant lesions or rashes. Cervical Nodes:  No significant cervical adenopathy. Psych:  Alert and cooperative. Normal mood and affect.   Lab Results: Recent Labs    05/18/21 0147 05/19/21 0137 05/20/21 0123  WBC 25.9* 20.4* 20.2*  HGB 11.4* 11.5* 11.9*  HCT 33.4* 34.3* 34.3*  PLT 316 379 389   BMET Recent Labs    05/18/21 0147 05/19/21 0137 05/20/21 0123  NA 130* 130* 127*  K 4.3 4.2 3.5  CL 103 103 98  CO2 21* 21* 21*  GLUCOSE 134* 108* 112*  BUN '18 18 16  '$ CREATININE 0.61 0.79 0.67  CALCIUM 8.1* 8.1* 8.1*    LFT Recent Labs    05/20/21 0123  PROT 5.8*  ALBUMIN 1.6*  AST 354*  ALT 380*  ALKPHOS 402*  BILITOT 3.2*   PT/INR No results for input(s): LABPROT, INR in the last 72 hours.  Studies/Results: CT ABDOMEN PELVIS W CONTRAST  Result Date: 05/19/2021 CLINICAL DATA:  Acute nonlocalized abdominal pain. Postop day 17 after open right inguinal hernia repair with mesh. EXAM: CT ABDOMEN AND PELVIS WITH CONTRAST TECHNIQUE: Multidetector CT imaging of the abdomen and pelvis was performed using the standard protocol following bolus administration of intravenous contrast. CONTRAST:  161m OMNIPAQUE IOHEXOL 300 MG/ML  SOLN COMPARISON:  05/13/2021 FINDINGS: Lower chest: Small right pleural effusion with basilar atelectasis, similar to prior study. Hepatobiliary: No focal liver lesions demonstrated. The gallbladder is distended with multiple stones. Gallbladder wall thickening and edema with pericholecystic stranding likely indicates acute cholecystitis. Intra and extrahepatic bile duct dilatation is present. Pancreas: Unremarkable. No pancreatic ductal dilatation or surrounding inflammatory changes. Spleen: Normal in size without focal abnormality. Adrenals/Urinary Tract: No adrenal gland nodules. Large cyst in the upper pole left kidney. Nephrograms are otherwise homogeneous and symmetrical. No hydronephrosis or hydroureter. Bladder is unremarkable. Stomach/Bowel: The stomach, small bowel, and colon are not abnormally distended. Scattered stool throughout the colon. Left lower quadrant bowel anastomosis again demonstrated with mild hazy infiltration in the adjacent mesenteric fat. Small collection adjacent to the anastomosis measures 2.3 cm. This collection is decreased in size since the previous study and the inflammatory infiltration is decreased. Loculated collection in the pelvis anterior to the rectum is again demonstrated although significantly decreased in size since prior study, today measuring 3.4 x  6.6 cm. There has been interval placement of a pigtail drainage catheter into this collection via trans sciatic approach. The catheter pigtail appears to be centrally positioned  within the residual collection. Additional small collection adjacent to the descending colon measuring 1.8 cm diameter is unchanged. No new collections are identified. Left inguinal hernia containing small bowel without proximal obstruction. No change. Vascular/Lymphatic: Aortic atherosclerosis. No enlarged abdominal or pelvic lymph nodes. Reproductive: Prostate gland is enlarged. Other: Small amount of free fluid in the abdomen, likely reactive. No free air identified. Anterior abdominal wall subcutaneous defect is likely postoperative and unchanged. Subcutaneous edema in the pelvis. Musculoskeletal: Lumbar scoliosis convex towards the right with diffuse degenerative change. Right hip arthroplasty. IMPRESSION: 1. Small right pleural effusion with basilar atelectasis. 2. Gallbladder distention. Multiple gallstones with gallbladder wall thickening and edema and pericholecystic stranding. Bile duct dilatation. Changes may indicate acute cholecystitis. 3. Pigtail drainage catheter placed in a pelvic collection. Significant interval decrease in the size of this collection with additional smaller collections unchanged. Decreased infiltrative change in the mesentery. 4. Left inguinal hernia containing small bowel without proximal obstruction, unchanged. 5. Aortic atherosclerosis. 6. Enlarged prostate gland. Electronically Signed   By: Lucienne Capers M.D.   On: 05/19/2021 18:42   US Abdomen Limited RUQ (LIVER/GB)  Result Date: 05/20/2021 CLINICAL DATA:  87 year old male with abnormal gallbladder on CT Abdomen and Pelvis. Abdominal pain, postop day 18 inguinal hernia repair with mesh. EXAM: ULTRASOUND ABDOMEN LIMITED RIGHT UPPER QUADRANT COMPARISON:  CT Abdomen and Pelvis 05/19/2021. FINDINGS: Gallbladder: Gallbladder distended with sludge  (image 7) and there is abnormal gallbladder wall thickening up to 6 mm (image 10). Dependent shadowing stones, gravel-like on image 16. Positive sonographic Murphy sign. Common bile duct: Diameter: 8 mm, mildly dilated. Liver: No focal lesion identified. Within normal limits in parenchymal echogenicity. Mild intrahepatic biliary ductal dilatation was better demonstrated by CT. Portal vein is patent on color Doppler imaging with normal direction of blood flow towards the liver. Other: Negative visible right kidney. IMPRESSION: 1. Acute Cholecystitis: gallbladder distended with sludge and stones, thickened wall, and positive sonographic Murphy sign. 2. Mild intra- and extrahepatic biliary ductal dilatation suspicious for associated choledocholithiasis. Electronically Signed   By: Genevie Ann M.D.   On: 05/20/2021 11:25    Impression:   Incarcerated hernia inguinal s/p laparotomy/mesh and reoperated, ~ 2 and ~1 week ago.  Persistent SBO vs ileus, post-operatively. Pelvic abscess s/p drainage.  Open healing anterior abdominal wall wound. Elevated LFTs. Bile duct stone(?). Cholecystitis by imaging and clinical exam.  Gallstones. Chronic anticoagulation, apixaban.  Plan:   Patient very poor candidate for ERCP at this time (two recent surgeries, ileus/SBO, open abdominal wound, debilitated state).  I am very reluctant to pursue ERCP.  Would not do MRCP at this time, as regardless of results I would be very reluctant to perform ERCP in patient's current state.  At this point his abdominal pain resolved with passage of flatus and his WBC has improved, though LFTs have increased. Treat medically with antibiotics and follow LFTs. If LFTs worsen or clinical exam worsens, defer management to CCS regarding medical management versus cholecystostomy tube.  Would not consider ERCP unless patient clinically deteriorates (which fortunately at this point he has improved) despite cholecystostomy tube (which hopefully he  doesn't require).   If patient can make improvement in his overall clinical status and current debility, could consider MRCP in a week or so, but, again, would not do now because regardless of results I would not consider ERCP at the present time.  Patient is also not inclined to have any further invasive studies unless emergently necessary. [facility] will follow along.  LOS: 18 days   Corneilus Heggie M  05/20/2021, 3:00 PM  Cell [phone] If no answer or after 5 PM call [phone]

## 2021-05-20 NOTE — Progress Notes (Signed)
Nutrition Follow Up  DOCUMENTATION CODES:   Non-severe (moderate) malnutrition in context of chronic illness  INTERVENTION:   Ensure Enlive po TID, each supplement provides 350 kcal and 20 grams of protein Magic cup TID with meals, each supplement provides 290 kcal and 9 grams of protein MVI with minerals daily   NUTRITION DIAGNOSIS:   Moderate Malnutrition related to chronic illness (recurrent hernias) as evidenced by mild fat depletion, mild muscle depletion, moderate fat depletion, moderate muscle depletion.  Ongoing  GOAL:   Patient will meet greater than or equal to 90% of their needs  Progressing slowly  MONITOR:   Diet advancement, PO intake, Supplement acceptance, Labs, Weight trends, I & O's  REASON FOR ASSESSMENT:   Consult New TPN/TNA  ASSESSMENT:   87 yo male with a PMH of SVT, HLD, appendectomy, and bilateral inguinal hernias with previous hernia repair who presents with SBO with incarcerated R inguinal hernia.  8/05 - s/p R inguinal hernia repair; NGT placed 8/06 - NGT replaced 8/12 - s/p ex lap, open small bowel resection with primary anastomosis  8/18 - diet advanced to clears  8/20 - diet advanced to regular  8/21 - TPN stopped   Showing signs of gallbladder disease. May need MRCP.   Intake declined over the last few days due to worsening abdominal pain. States he didn't consume lunch or dinner yesterday. RD observed breakfast tray at bedside untouched. Lunch was served during RD visit and patient able to eat roast while speaking with RD. Discussed the importance of protein intake for preservation of lean body mass and to promote wound healing. Patient willing to drink Ensure.   If intake unable to progress recommend placing Cortrak for EN or restarting TPN.   Admission weight: 77.1 kg  Current weight: 78.4 kg (taken 8/20)  UOP: 2775 ml x 24 hrs  JP drain: 20 ml x 24 hrs   Medications: reviewed  Labs: Na 127 (L) LFTs elevated CBG  149-154  Diet Order:   Diet Order             Diet regular Room service appropriate? Yes with Assist; Fluid consistency: Thin  Diet effective now                  EDUCATION NEEDS:   Not appropriate for education at this time  Skin:  Skin Assessment: Skin Integrity Issues: Skin Integrity Issues:: Incisions Incisions: R groin, closed Wound: dehisced abdominal wound   Last BM:  8/21  Height:  Ht Readings from Last 1 Encounters:  05/02/21 '5\' 11"'$  (1.803 m)   Weight:  Wt Readings from Last 1 Encounters:  05/17/21 78.4 kg   BMI:  Body mass index is 24.11 kg/m.  Estimated Nutritional Needs:   Kcal:  2000-2200 kcal  Protein:  95-115 grams  Fluid:  >/= 2 L/day  Gary Single MS, RD, LDN, CNSC Clinical Nutrition Pager listed in [place]

## 2021-05-20 NOTE — TOC Initial Note (Addendum)
Transition of Care [facility]) - Initial/Assessment Note    Patient Details  Name: Gary Rice MRN: [id number] Date of Birth: [birth year]  [facility]) CM/SW Contact:    Vinie Sill, LCSW Phone Number: 05/20/2021, 4:28 PM  Clinical Narrative:                  CSW met with patient. CSW introduced self and explained role. CSW discussed with patient MD reported concerns, such as; wound care, drain management and possible IV antibiotics  and managing those things at home. Patient states he lives alone with his wife and she unable to provide the level of care needed at this time. Patient states he is agreeable to short term rehab at [facility].  CSW explained the SNF process. Patient states no preferred SNF. All questions answered.  CSW will provide bed offers once available. CSW will continue to follow and assist with discharge planning.  Thurmond Butts, MSW, LCSW Clinical Social Worker    Expected Discharge Plan: Skilled Nursing Facility Barriers to Discharge: Continued Medical Work up, SNF Pending bed offer   Patient Goals and CMS Choice Patient states their goals for this hospitalization and ongoing recovery are:: To get stronger and get out of here CMS Medicare.gov Compare Post Acute Care list provided to:: Patient Choice offered to / list presented to : Patient  Expected Discharge Plan and Services Expected Discharge Plan: [place] In-house Referral: Clinical Social Work Discharge Planning Services: CM Consult Post Acute Care Choice: NA Living arrangements for the past 2 months: Single Family Home                   DME Agency: NA       HH Arranged: RN, PT, OT HH Agency: [place] Date [facility] Agency Contacted: 05/07/21 Time HH Agency Contacted: 80 Representative spoke with at [place]: Malachy Mood  Prior Living Arrangements/Services Living arrangements for the past 2 months: [place] with:: Self,  Spouse Patient language and need for interpreter reviewed:: No Do you feel safe going back to the place where you live?: Yes      Need for Family Participation in Patient Care: Yes (Comment) Care giver support system in place?: Yes (comment)   Criminal Activity/Legal Involvement Pertinent to Current Situation/Hospitalization: No - Comment as needed  Activities of Daily Living Home Assistive Devices/Equipment: None ADL Screening (condition at time of admission) Patient's cognitive ability adequate to safely complete daily activities?: Yes Is the patient deaf or have difficulty hearing?: Yes Does the patient have difficulty seeing, even when wearing glasses/contacts?: No Does the patient have difficulty concentrating, remembering, or making decisions?: Yes Patient able to express need for assistance with ADLs?: No Does the patient have difficulty dressing or bathing?: No Independently performs ADLs?: Yes (appropriate for developmental age) Does the patient have difficulty walking or climbing stairs?: Yes Weakness of Legs: None Weakness of Arms/Hands: None  Permission Sought/Granted Permission sought to share information with : Family Supports Permission granted to share information with : Yes, Verbal Permission Granted  Share Information with NAME: Bladen Umar  Permission granted to share info w AGENCY: SNFs  Permission granted to share info w Relationship: spouse  Permission granted to share info w Contact Information: [phone]  Emotional Assessment Appearance:: Appears younger than stated age Attitude/Demeanor/Rapport: Engaged Affect (typically observed): Pleasant, Appropriate Orientation: : Oriented to Self, Oriented to Place, Oriented to  Time, Oriented to Situation Alcohol / Substance Use: Not Applicable Psych Involvement:  No (comment)  Admission diagnosis:  SBO (small bowel obstruction) (HCC) [K56.609] Atrial fibrillation with rapid ventricular response ([place])  [I48.91] Incarcerated hernia [K46.0] Patient Active Problem List   Diagnosis Date Noted   Pelvic abscess in male [facility]) 05/14/2021   Malnutrition of moderate degree 05/12/2021   Atrial fibrillation with rapid ventricular response ([place]) 05/02/2021   SBO (small bowel obstruction) ([place]) 05/02/2021   Leukocytosis 05/02/2021   Hyperlipidemia 05/02/2021   Thoracic aortic aneurysm ([place]) 05/02/2021   Incarcerated hernia    PCP:  Josetta Huddle, MD Pharmacy:   [id number] [place] [place] [place]Phone: [phone]: [phone]    Social Determinants of Health (SDOH) Interventions    Readmission Risk Interventions No flowsheet data found.

## 2021-05-20 NOTE — Progress Notes (Signed)
   Pelvic abscess drain placed in IR 05/13/21  +Klebsiella-- on Meropenem OP minimal-- bloody Flushes easily Afeb Wbc 20 today  CT 8/22:  IMPRESSION: 1. Small right pleural effusion with basilar atelectasis. 2. Gallbladder distention. Multiple gallstones with gallbladder wall thickening and edema and pericholecystic stranding. Bile duct dilatation. Changes may indicate acute cholecystitis. 3. Pigtail drainage catheter placed in a pelvic collection. Significant interval decrease in the size of this collection with additional smaller collections unchanged. Decreased infiltrative change in the mesentery. 4. Left inguinal hernia containing small bowel without proximal obstruction, unchanged.   I have reviewed imaging with Dr Laurence Ferrari Collection is still significant; although smaller  Increase flushes  Will continue drain for now If no significant OP --- consider upsize of drain catheter  Will continue to follow

## 2021-05-20 NOTE — Progress Notes (Signed)
Pharmacy Antibiotic Note  Gary Rice is a 87 y.o. male on day #5 Meropenem for intra-abdominal abscess.  Cultures grew Klebsiella and Bacteroides. ID following. Drain placed 05/13/21.  Elevated LFTs and possible acute cholecystitis, currently planning medical treatment.   Renal function stable  Plan: Continue Meropenem 1gm IV q8h. Follow renal function for any need to adjust. Follow clinical progress, length of therapy.   Height: '5\' 11"'$  (180.3 cm) Weight: 78.4 kg (172 lb 13.5 oz) IBW/kg (Calculated) : 75.3  Temp (24hrs), Avg:98 F (36.7 C), Min:97.7 F (36.5 C), Max:98.6 F (37 C)  Recent Labs  Lab 05/15/21 1027 05/16/21 0241 05/17/21 0059 05/18/21 0147 05/19/21 0137 05/20/21 0123  WBC 17.4* 19.5* 26.5* 25.9* 20.4* 20.2*  CREATININE 0.58* 0.59*  --  0.61 0.79 0.67    Estimated Creatinine Clearance: 68 mL/min (by C-G formula based on SCr of 0.67 mg/dL).    No Known Allergies  Antimicrobials this admission: Meropenem 8/19>> Zosyn 8/16 >> 8/19  Dose adjustments this admission: none  Microbiology results: 8/16 pelvic abscess: moderate Klebsiella oxytoca and abundant Bacteroides fragilis - sensitive to Cipro, Cefepime, Ceftazidime, Imipenem, Septra.  Resistant to ampicillin, cefazolin, unasyn, zosyn 8/5 COVID and flu: negative  Thank you for allowing pharmacy to be a part of this patient's care.  Arty Baumgartner, [place] 05/20/2021 4:26 PM

## 2021-05-21 ENCOUNTER — Inpatient Hospital Stay (HOSPITAL_COMMUNITY): Payer: Medicare Other

## 2021-05-21 DIAGNOSIS — K651 Peritoneal abscess: Secondary | ICD-10-CM | POA: Diagnosis not present

## 2021-05-21 DIAGNOSIS — D72829 Elevated white blood cell count, unspecified: Secondary | ICD-10-CM | POA: Diagnosis not present

## 2021-05-21 DIAGNOSIS — K829 Disease of gallbladder, unspecified: Secondary | ICD-10-CM | POA: Diagnosis not present

## 2021-05-21 DIAGNOSIS — K460 Unspecified abdominal hernia with obstruction, without gangrene: Secondary | ICD-10-CM | POA: Diagnosis not present

## 2021-05-21 LAB — COMPREHENSIVE METABOLIC PANEL
ALT: 467 U/L — ABNORMAL HIGH (ref 0–44)
AST: 430 U/L — ABNORMAL HIGH (ref 15–41)
Albumin: 1.7 g/dL — ABNORMAL LOW (ref 3.5–5.0)
Alkaline Phosphatase: 453 U/L — ABNORMAL HIGH (ref 38–126)
Anion gap: 7 (ref 5–15)
BUN: 17 mg/dL (ref 8–23)
CO2: 23 mmol/L (ref 22–32)
Calcium: 8.2 mg/dL — ABNORMAL LOW (ref 8.9–10.3)
Chloride: 99 mmol/L (ref 98–111)
Creatinine, Ser: 0.75 mg/dL (ref 0.61–1.24)
GFR, Estimated: >60 mL/min (ref >60)
Glucose, Bld: 145 mg/dL — ABNORMAL HIGH (ref 70–99)
Potassium: 3.6 mmol/L (ref 3.5–5.1)
Sodium: 129 mmol/L — ABNORMAL LOW (ref 135–145)
Total Bilirubin: 4 mg/dL — ABNORMAL HIGH (ref 0.3–1.2)
Total Protein: 6.1 g/dL — ABNORMAL LOW (ref 6.5–8.1)

## 2021-05-21 LAB — DIFFERENTIAL
Abs Immature Granulocytes: 0.31 10*3/uL — ABNORMAL HIGH (ref 0.00–0.07)
Basophils Absolute: 0.1 10*3/uL (ref 0.0–0.1)
Basophils Relative: 1 %
Eosinophils Absolute: 0.4 10*3/uL (ref 0.0–0.5)
Eosinophils Relative: 3 %
Immature Granulocytes: 2 %
Lymphocytes Relative: 8 %
Lymphs Abs: 1.1 10*3/uL (ref 0.7–4.0)
Monocytes Absolute: 1 10*3/uL (ref 0.1–1.0)
Monocytes Relative: 7 %
Neutro Abs: 11 10*3/uL — ABNORMAL HIGH (ref 1.7–7.7)
Neutrophils Relative %: 79 %

## 2021-05-21 LAB — CBC
HCT: 33.7 % — ABNORMAL LOW (ref 39.0–52.0)
Hemoglobin: 11.7 g/dL — ABNORMAL LOW (ref 13.0–17.0)
MCH: 31.5 pg (ref 26.0–34.0)
MCHC: 34.7 g/dL (ref 30.0–36.0)
MCV: 90.6 fL (ref 80.0–100.0)
Platelets: 406 10*3/uL — ABNORMAL HIGH (ref 150–400)
RBC: 3.72 MIL/uL — ABNORMAL LOW (ref 4.22–5.81)
RDW: 13.2 % (ref 11.5–15.5)
WBC: 13.6 10*3/uL — ABNORMAL HIGH (ref 4.0–10.5)
nRBC: 0 % (ref 0.0–0.2)

## 2021-05-21 MED ORDER — SODIUM CHLORIDE 0.9 % IV SOLN: INTRAVENOUS | Status: AC

## 2021-05-21 MED ORDER — GADOBUTROL 1 MMOL/ML IV SOLN
7.5000 mL | Freq: Once | INTRAVENOUS | Status: AC | PRN
  Administered 2021-05-21: 7.5 mL via INTRAVENOUS

## 2021-05-21 NOTE — Progress Notes (Addendum)
12 Days Post-Op  Subjective: CC: No abdominal pain, n/v. Tolerating diet. Passing flatus. BM yesterday.   Objective: Vital signs in last 24 hours: Temp:  [97.7 F (36.5 C)-98.5 F (36.9 C)] 98.5 F (36.9 C) (08/24 0757) Pulse Rate:  [70-80] 76 (08/24 0757) Resp:  [15-20] 15 (08/24 0757) BP: (95-118)/(59-67) 107/59 (08/24 0757) SpO2:  [95 %] 95 % (08/24 0757) Last BM Date: 05/18/21  Intake/Output from previous day: 08/23 0701 - 08/24 0700 In: 240 [P.O.:120; I.V.:15; IV Piggyback:100] Out: 1779 [Urine:1750; Drains:23] Intake/Output this shift: Total I/O In: 20 [I.V.:20] Out: 500 [Urine:500]  PE: General: pleasant, WD, elderly male who is laying in bed in NAD Heart: regular  Lungs: CTAB, no wheezes, rhonchi, or rales noted.  Respiratory effort nonlabored Abd: soft, NT, mildly distended, +BS, midline wound with some fibrinous exudate in wound base and dihisced without evisceration. RLQ incision c/d/I. L inguinal hernia partially reducible and NT w/ no skin changes. Picture of wound below.  Ext:  No LE edema or calf tenderness Psych: A&Ox3  Skin: Blanchable MP rash on chest and abdomen as noted below in picture       Lab Results:  Recent Labs    05/20/21 0123 05/21/21 0350  WBC 20.2* 13.6*  HGB 11.9* 11.7*  HCT 34.3* 33.7*  PLT 389 406*   BMET Recent Labs    05/20/21 0123 05/21/21 0350  NA 127* 129*  K 3.5 3.6  CL 98 99  CO2 21* 23  GLUCOSE 112* 145*  BUN 16 17  CREATININE 0.67 0.75  CALCIUM 8.1* 8.2*   PT/INR No results for input(s): LABPROT, INR in the last 72 hours. CMP     Component Value Date/Time   NA 129 (L) 05/21/2021 0350   K 3.6 05/21/2021 0350   CL 99 05/21/2021 0350   CO2 23 05/21/2021 0350   GLUCOSE 145 (H) 05/21/2021 0350   BUN 17 05/21/2021 0350   CREATININE 0.75 05/21/2021 0350   CALCIUM 8.2 (L) 05/21/2021 0350   PROT 6.1 (L) 05/21/2021 0350   ALBUMIN 1.7 (L) 05/21/2021 0350   AST 430 (H) 05/21/2021 0350   ALT 467 (H)  05/21/2021 0350   ALKPHOS 453 (H) 05/21/2021 0350   BILITOT 4.0 (H) 05/21/2021 0350   GFRNONAA >60 05/21/2021 0350   GFRAA  08/14/2007 0312    >60        The eGFR has been calculated using the MDRD equation. This calculation has not been validated in all clinical   Lipase     Component Value Date/Time   LIPASE 27 05/02/2021 1806    Studies/Results: CT ABDOMEN PELVIS W CONTRAST  Result Date: 05/19/2021 CLINICAL DATA:  Acute nonlocalized abdominal pain. Postop day 17 after open right inguinal hernia repair with mesh. EXAM: CT ABDOMEN AND PELVIS WITH CONTRAST TECHNIQUE: Multidetector CT imaging of the abdomen and pelvis was performed using the standard protocol following bolus administration of intravenous contrast. CONTRAST:  186m OMNIPAQUE IOHEXOL 300 MG/ML  SOLN COMPARISON:  05/13/2021 FINDINGS: Lower chest: Small right pleural effusion with basilar atelectasis, similar to prior study. Hepatobiliary: No focal liver lesions demonstrated. The gallbladder is distended with multiple stones. Gallbladder wall thickening and edema with pericholecystic stranding likely indicates acute cholecystitis. Intra and extrahepatic bile duct dilatation is present. Pancreas: Unremarkable. No pancreatic ductal dilatation or surrounding inflammatory changes. Spleen: Normal in size without focal abnormality. Adrenals/Urinary Tract: No adrenal gland nodules. Large cyst in the upper pole left kidney. Nephrograms are otherwise homogeneous and symmetrical.  No hydronephrosis or hydroureter. Bladder is unremarkable. Stomach/Bowel: The stomach, small bowel, and colon are not abnormally distended. Scattered stool throughout the colon. Left lower quadrant bowel anastomosis again demonstrated with mild hazy infiltration in the adjacent mesenteric fat. Small collection adjacent to the anastomosis measures 2.3 cm. This collection is decreased in size since the previous study and the inflammatory infiltration is decreased.  Loculated collection in the pelvis anterior to the rectum is again demonstrated although significantly decreased in size since prior study, today measuring 3.4 x 6.6 cm. There has been interval placement of a pigtail drainage catheter into this collection via trans sciatic approach. The catheter pigtail appears to be centrally positioned within the residual collection. Additional small collection adjacent to the descending colon measuring 1.8 cm diameter is unchanged. No new collections are identified. Left inguinal hernia containing small bowel without proximal obstruction. No change. Vascular/Lymphatic: Aortic atherosclerosis. No enlarged abdominal or pelvic lymph nodes. Reproductive: Prostate gland is enlarged. Other: Small amount of free fluid in the abdomen, likely reactive. No free air identified. Anterior abdominal wall subcutaneous defect is likely postoperative and unchanged. Subcutaneous edema in the pelvis. Musculoskeletal: Lumbar scoliosis convex towards the right with diffuse degenerative change. Right hip arthroplasty. IMPRESSION: 1. Small right pleural effusion with basilar atelectasis. 2. Gallbladder distention. Multiple gallstones with gallbladder wall thickening and edema and pericholecystic stranding. Bile duct dilatation. Changes may indicate acute cholecystitis. 3. Pigtail drainage catheter placed in a pelvic collection. Significant interval decrease in the size of this collection with additional smaller collections unchanged. Decreased infiltrative change in the mesentery. 4. Left inguinal hernia containing small bowel without proximal obstruction, unchanged. 5. Aortic atherosclerosis. 6. Enlarged prostate gland. Electronically Signed   By: Lucienne Capers M.D.   On: 05/19/2021 18:42   US Abdomen Limited RUQ (LIVER/GB)  Result Date: 05/20/2021 CLINICAL DATA:  87 year old male with abnormal gallbladder on CT Abdomen and Pelvis. Abdominal pain, postop day 18 inguinal hernia repair with mesh.  EXAM: ULTRASOUND ABDOMEN LIMITED RIGHT UPPER QUADRANT COMPARISON:  CT Abdomen and Pelvis 05/19/2021. FINDINGS: Gallbladder: Gallbladder distended with sludge (image 7) and there is abnormal gallbladder wall thickening up to 6 mm (image 10). Dependent shadowing stones, gravel-like on image 16. Positive sonographic Murphy sign. Common bile duct: Diameter: 8 mm, mildly dilated. Liver: No focal lesion identified. Within normal limits in parenchymal echogenicity. Mild intrahepatic biliary ductal dilatation was better demonstrated by CT. Portal vein is patent on color Doppler imaging with normal direction of blood flow towards the liver. Other: Negative visible right kidney. IMPRESSION: 1. Acute Cholecystitis: gallbladder distended with sludge and stones, thickened wall, and positive sonographic Murphy sign. 2. Mild intra- and extrahepatic biliary ductal dilatation suspicious for associated choledocholithiasis. Electronically Signed   By: Genevie Ann M.D.   On: 05/20/2021 11:25    Anti-infectives: Anti-infectives (From admission, onward)    Start     Dose/Rate Route Frequency Ordered Stop   05/16/21 1745  meropenem (MERREM) 1 g in sodium chloride 0.9 % 100 mL IVPB        1 g 200 mL/hr over 30 Minutes Intravenous Every 8 hours 05/16/21 1655     05/13/21 1515  piperacillin-tazobactam (ZOSYN) IVPB 3.375 g  Status:  Discontinued        3.375 g 12.5 mL/hr over 240 Minutes Intravenous Every 8 hours 05/13/21 1421 05/16/21 1640   05/09/21 0915  ceFAZolin (ANCEF) IVPB 2g/100 mL premix        2 g 200 mL/hr over 30 Minutes Intravenous On call  to O.R. 05/09/21 0825 05/09/21 1050        Assessment/Plan POD 19, s/p open right inguinal hernia repair with mesh by Dr. Bobbye Morton for incarcerated RIH with persistent ileus vs SBO POD 11, s/p dx lap with mini laparotomy with SBR, Dr. Donne Hazel 05/09/21 - S/p IR drainage 8/16 for IA fluid collection. Cx's w/ Klebsiella Oxytoca and abx switched to merrem 8/19 per s/s. CT 8/22 w/  decrease in collection size, now 3.4 x 6.6. IR increasing flushes and considering upsizing  - TID WTD for midline wound  - IS/pulm toilet - Mobilize TID in halls - Staples out    Elevated LFT's with possible acute cholecystitis - CT 8/22 and [place] 8/23 w/ findings concerning for acute cholecystitis and dilated CBD. Had upper abdominal pain 8/22 that was worse on the right but no assc n/v and he was unsure if worsened after eating. He has no pain or tenderness currently and is tolerating a diet. Concerned for choledocholithiasis with uptrending LFT's (T. Bili of 4.). MRCP pending. GI, Dr. Paulita Fujita has seen. He is already on Merrem. If patient truly has acute cholecystitis on MRCP would favor tx w/ abx alone vs perc chole drain given how close he is out from 2 recent surgeries listed above. Will ask [facility] to hold Eliquis.   FEN - On reg diet   VTE - SCDs, please hold eliquis (will reach out to [facility]). Okay for heparin gtt ID - Merrem (ID following)   MP Rash - will reach out to [place] onset a fib - on cardizem Ascending thoracic aneurysm HLD   LOS: 19 days    Jillyn Ledger , [facility] 05/21/2021, 10:46 AM Please see Amion for pager number during day hours 7:00am-4:30pm

## 2021-05-21 NOTE — Progress Notes (Signed)
[facility] PROGRESS NOTE    Progress Note  Gary Rice [facility]  [id number] DOB: December 05, 1930 DOA: 05/02/2021 PCP: Josetta Huddle, MD     Brief Narrative:   Gary Rice is an 87 y.o. male past medical history of SVT, hyperlipidemia bilateral ankle hernias came into the hospital not feeling well found in new onset atrial fibrillation and SBL with incarcerated right inguinal hernia underwent urgent repair with mesh placement with small bowel resection and primary anastomosis.  During his hospitalization he did develop intra-abdominal infection with a CT scan of the abdomen and pelvis showing a large pelvic abscess, IR was consulted who placed a drain on 05/13/2021    Assessment/Plan:  Incarcerated right inguinal hernia with SBO: Status post surgical intervention on 05/03/2021 with mesh placement. Diagnostic laparoscopy performed on 05/09/2021 secondary to ongoing left lower quadrant abdominal pain, imaging show perforation status post bowel resection with primary anastomosis. Was briefly on TPN until 05/18/2021. Currently advancing his diet. Further management per surgery. Continue to work with physical therapy. Out of bed to chair.  Acute cholecystitis: General surgery was consulted recommended to consult GI for possible MRCP. Patient was deemed a poor surgical candidate.  GI is reluctant to pursue an ERCP unless he clinically deteriorates..  GI recommended to continue empiric antibiotics He is currently on IV meropenem. GI recommended not to pursue MRCP at this point in time as his abdominal pain is improving as well as his white blood cell count. Recommended to monitor his LFTs and if clinically worsen, further management deferred to [facility] regarding medical versus percutaneous biliary drain. LFTs continue to trend up further management per general surgery  Intra-abdominal abscess: CT abdomen and pelvis on 03/18/2019 consistent with pelvic abscess. IR was consulted, performed  CT-guided drainage from 04/13/2019 Wound culture grew bacteroids and MDR Klebsiella oxytoca repeated CT scan showed decrease in size and pelvic abscess, additional smaller collection shows no change. Continue IV meropenem.  Intractable nausea and vomiting: Required NG tube placement temporarily now resolved.  New onset atrial fibrillation with RVR: Cardiology was consulted and he was treated with IV Cardizem and metoprolol transesophageal echo showed preserved EF. Generally rate controlled on Cardizem 120 g 3 times daily. Continue Eliquis.  Dyspnea: Improved with position and incentive spirometry. Chest x-ray unremarkable. Ambulated Feb 15, 2021 dyspnea on exertion but saturations remain above 90.  Hyponatremia: Asymptomatic mild slowly improving.  Start on gentle IV fluid hydration.  Hyperlipidemia: Continue statins.  Ascending aortic aneurysm: Continue follow-up with CT surgery as an outpatient.  Leukocytosis: Likely secondary to SBO incarceration.   DVT prophylaxis: Eliquis Family Communication:none Status is: Inpatient  Remains inpatient appropriate because:Hemodynamically unstable  Dispo: The patient is from: Home              Anticipated d/c is to: Home              Patient currently is not medically stable to d/c.   Difficult to place patient No   Code Status:     Code Status Orders  (From admission, onward)           Start     Ordered   05/03/21 0017  Full code  Continuous        05/03/21 0016           Code Status History     This patient has a current code status but no historical code status.         IV Access:   Peripheral IV  Procedures and diagnostic studies:   CT ABDOMEN PELVIS W CONTRAST  Result Date: 05/19/2021 CLINICAL DATA:  Acute nonlocalized abdominal pain. Postop day 17 after open right inguinal hernia repair with mesh. EXAM: CT ABDOMEN AND PELVIS WITH CONTRAST TECHNIQUE: Multidetector CT imaging of the abdomen and  pelvis was performed using the standard protocol following bolus administration of intravenous contrast. CONTRAST:  174m OMNIPAQUE IOHEXOL 300 MG/ML  SOLN COMPARISON:  05/13/2021 FINDINGS: Lower chest: Small right pleural effusion with basilar atelectasis, similar to prior study. Hepatobiliary: No focal liver lesions demonstrated. The gallbladder is distended with multiple stones. Gallbladder wall thickening and edema with pericholecystic stranding likely indicates acute cholecystitis. Intra and extrahepatic bile duct dilatation is present. Pancreas: Unremarkable. No pancreatic ductal dilatation or surrounding inflammatory changes. Spleen: Normal in size without focal abnormality. Adrenals/Urinary Tract: No adrenal gland nodules. Large cyst in the upper pole left kidney. Nephrograms are otherwise homogeneous and symmetrical. No hydronephrosis or hydroureter. Bladder is unremarkable. Stomach/Bowel: The stomach, small bowel, and colon are not abnormally distended. Scattered stool throughout the colon. Left lower quadrant bowel anastomosis again demonstrated with mild hazy infiltration in the adjacent mesenteric fat. Small collection adjacent to the anastomosis measures 2.3 cm. This collection is decreased in size since the previous study and the inflammatory infiltration is decreased. Loculated collection in the pelvis anterior to the rectum is again demonstrated although significantly decreased in size since prior study, today measuring 3.4 x 6.6 cm. There has been interval placement of a pigtail drainage catheter into this collection via trans sciatic approach. The catheter pigtail appears to be centrally positioned within the residual collection. Additional small collection adjacent to the descending colon measuring 1.8 cm diameter is unchanged. No new collections are identified. Left inguinal hernia containing small bowel without proximal obstruction. No change. Vascular/Lymphatic: Aortic atherosclerosis. No  enlarged abdominal or pelvic lymph nodes. Reproductive: Prostate gland is enlarged. Other: Small amount of free fluid in the abdomen, likely reactive. No free air identified. Anterior abdominal wall subcutaneous defect is likely postoperative and unchanged. Subcutaneous edema in the pelvis. Musculoskeletal: Lumbar scoliosis convex towards the right with diffuse degenerative change. Right hip arthroplasty. IMPRESSION: 1. Small right pleural effusion with basilar atelectasis. 2. Gallbladder distention. Multiple gallstones with gallbladder wall thickening and edema and pericholecystic stranding. Bile duct dilatation. Changes may indicate acute cholecystitis. 3. Pigtail drainage catheter placed in a pelvic collection. Significant interval decrease in the size of this collection with additional smaller collections unchanged. Decreased infiltrative change in the mesentery. 4. Left inguinal hernia containing small bowel without proximal obstruction, unchanged. 5. Aortic atherosclerosis. 6. Enlarged prostate gland. Electronically Signed   By: WLucienne CapersM.D.   On: 05/19/2021 18:42   [place]Abdomen Limited RUQ (LIVER/GB)  Result Date: 05/20/2021 CLINICAL DATA:  87year old male with abnormal gallbladder on CT Abdomen and Pelvis. Abdominal pain, postop day 18 inguinal hernia repair with mesh. EXAM: ULTRASOUND ABDOMEN LIMITED RIGHT UPPER QUADRANT COMPARISON:  CT Abdomen and Pelvis 05/19/2021. FINDINGS: Gallbladder: Gallbladder distended with sludge (image 7) and there is abnormal gallbladder wall thickening up to 6 mm (image 10). Dependent shadowing stones, gravel-like on image 16. Positive sonographic Murphy sign. Common bile duct: Diameter: 8 mm, mildly dilated. Liver: No focal lesion identified. Within normal limits in parenchymal echogenicity. Mild intrahepatic biliary ductal dilatation was better demonstrated by CT. Portal vein is patent on color Doppler imaging with normal direction of blood flow towards the liver.  Other: Negative visible right kidney. IMPRESSION: 1. Acute Cholecystitis: gallbladder distended with sludge and  stones, thickened wall, and positive sonographic Murphy sign. 2. Mild intra- and extrahepatic biliary ductal dilatation suspicious for associated choledocholithiasis. Electronically Signed   By: Genevie Ann M.D.   On: 05/20/2021 11:25     Medical Consultants:   None.   Subjective:    Jourdyn Spaw [facility] denies any abdominal pain anorexic.  Objective:    Vitals:   05/20/21 1400 05/20/21 1521 05/20/21 2021 05/21/21 0315  BP: 95/60 104/67 107/66 118/61  Pulse: 80 75 80 70  Resp: '18  19 20  '$ Temp: 97.7 F (36.5 C)  97.7 F (36.5 C) 97.7 F (36.5 C)  TempSrc: Oral  Oral Oral  SpO2:      Weight:      Height:       SpO2: 96 % O2 Flow Rate (L/min): 2 L/min   Intake/Output Summary (Last 24 hours) at 05/21/2021 0741 Last data filed at 05/21/2021 [id number] Gross per 24 hour  Intake 240 ml  Output 1523 ml  Net -1283 ml   Filed Weights   05/02/21 1805 05/17/21 1242  Weight: 77.1 kg 78.4 kg    Exam: General exam: In no acute distress. Respiratory system: Good air movement and clear to auscultation. Cardiovascular system: S1 & S2 heard, RRR. No JVD,. Gastrointestinal system: Abdomen is nondistended, soft and nontender.  Drain in place. Extremities: No pedal edema. Skin: No rashes, lesions or ulcers    Data Reviewed:    Labs: Basic Metabolic Panel: Recent Labs  Lab 05/15/21 1027 05/16/21 0241 05/18/21 0147 05/19/21 0137 05/20/21 0123 05/21/21 0350  NA 133* 131* 130* 130* 127* 129*  K 4.0 4.0 4.3 4.2 3.5 3.6  CL 106 103 103 103 98 99  CO2 22 21* 21* 21* 21* 23  GLUCOSE 134* 157* 134* 108* 112* 145*  BUN '14 14 18 18 16 17  '$ CREATININE 0.58* 0.59* 0.61 0.79 0.67 0.75  CALCIUM 8.1* 8.3* 8.1* 8.1* 8.1* 8.2*  MG 2.0 2.0  --   --   --   --   PHOS 3.1 3.0  --   --   --   --    GFR Estimated Creatinine Clearance: 68 mL/min (by C-G formula based on SCr of 0.75  mg/dL). Liver Function Tests: Recent Labs  Lab 05/15/21 1027 05/19/21 0137 05/20/21 0123 05/21/21 0350  AST 78* 113* 354* 430*  ALT 104* 149* 380* 467*  ALKPHOS 99 149* 402* 453*  BILITOT 0.8 0.7 3.2* 4.0*  PROT 5.6* 5.8* 5.8* 6.1*  ALBUMIN 1.9* 1.6* 1.6* 1.7*   No results for input(s): LIPASE, AMYLASE in the last 168 hours. No results for input(s): AMMONIA in the last 168 hours. Coagulation profile No results for input(s): INR, PROTIME in the last 168 hours. COVID-19 Labs  No results for input(s): DDIMER, FERRITIN, LDH, CRP in the last 72 hours.  Lab Results  Component Value Date   [place] NEGATIVE 05/02/2021    CBC: Recent Labs  Lab 05/17/21 0059 05/18/21 0147 05/19/21 0137 05/20/21 0123 05/21/21 0350  WBC 26.5* 25.9* 20.4* 20.2* 13.6*  HGB 11.7* 11.4* 11.5* 11.9* 11.7*  HCT 35.2* 33.4* 34.3* 34.3* 33.7*  MCV 92.6 91.8 92.2 91.0 90.6  PLT 335 316 379 389 406*   Cardiac Enzymes: No results for input(s): CKTOTAL, CKMB, CKMBINDEX, TROPONINI in the last 168 hours. BNP (last 3 results) No results for input(s): PROBNP in the last 8760 hours. CBG: Recent Labs  Lab 05/18/21 0000 05/18/21 0354 05/18/21 0747 05/18/21 1059 05/18/21 1714  GLUCAP 134* 154* 151*  151* 149*   D-Dimer: No results for input(s): DDIMER in the last 72 hours. Hgb A1c: No results for input(s): HGBA1C in the last 72 hours. Lipid Profile: No results for input(s): CHOL, HDL, LDLCALC, TRIG, CHOLHDL, LDLDIRECT in the last 72 hours. Thyroid function studies: No results for input(s): TSH, T4TOTAL, T3FREE, THYROIDAB in the last 72 hours.  Invalid input(s): FREET3 Anemia work up: No results for input(s): VITAMINB12, FOLATE, FERRITIN, TIBC, IRON, RETICCTPCT in the last 72 hours. Sepsis Labs: Recent Labs  Lab 05/18/21 0147 05/19/21 0137 05/20/21 0123 05/21/21 0350  WBC 25.9* 20.4* 20.2* 13.6*   Microbiology Recent Results (from the past 240 hour(s))  Aerobic/Anaerobic Culture w  Gram Stain (surgical/deep wound)     Status: None   Collection Time: 05/13/21  4:49 PM   Specimen: Fluid; Abscess  Result Value Ref Range Status   Specimen Description FLUID PELVIS  Final   Special Requests NONE  Final   Gram Stain   Final    NO SQUAMOUS EPITHELIAL CELLS SEEN RARE WBC SEEN ABUNDANT GRAM NEGATIVE RODS FEW GRAM VARIABLE ROD    Culture   Final    MODERATE KLEBSIELLA OXYTOCA ABUNDANT BACTEROIDES FRAGILIS BETA LACTAMASE POSITIVE Performed at [facility], [place] [address] [place], [place] [address]    Report Status 05/16/2021 FINAL  Final   Organism ID, Bacteria KLEBSIELLA OXYTOCA  Final      Susceptibility   Klebsiella oxytoca - MIC*    AMPICILLIN >=32 RESISTANT Resistant     CEFAZOLIN >=64 RESISTANT Resistant     CEFEPIME 1 SENSITIVE Sensitive     CEFTAZIDIME <=1 SENSITIVE Sensitive     CEFTRIAXONE 16 RESISTANT Resistant     CIPROFLOXACIN <=0.25 SENSITIVE Sensitive     GENTAMICIN <=1 SENSITIVE Sensitive     IMIPENEM <=0.25 SENSITIVE Sensitive     TRIMETH/SULFA <=20 SENSITIVE Sensitive     AMPICILLIN/SULBACTAM >=32 RESISTANT Resistant     PIP/TAZO >=128 RESISTANT Resistant     * MODERATE KLEBSIELLA OXYTOCA     Medications:    acetaminophen  650 mg Oral Q6H   apixaban  5 mg Oral BID   atorvastatin  10 mg Oral QHS   Chlorhexidine Gluconate Cloth  6 each Topical Daily   diltiazem  120 mg Oral Q8H   feeding supplement  237 mL Oral TID BM   pantoprazole (PROTONIX) IV  40 mg Intravenous Q24H   sodium chloride flush  10-40 mL Intracatheter Q12H   sodium chloride flush  5 mL Intracatheter Q8H   Continuous Infusions:  lactated ringers Stopped (05/18/21 0640)   meropenem (MERREM) IV 1 g (05/21/21 0607)   methocarbamol (ROBAXIN) IV     promethazine (PHENERGAN) injection (IM or IVPB) 70 mL/hr at 05/04/21 0230      LOS: 19 days   [facility]  05/21/2021, 7:41 AM

## 2021-05-21 NOTE — Progress Notes (Signed)
[facility]  Date of Admission:  05/02/2021     Total days of antibiotics 10         ASSESSMENT:  Mr. Lacour continues to remain clinically stable with no pain despite complications with acute cholecystitis with General Surgery ordering MRCP with slightly worsening transaminitis. Discussed that he is on appropriate antimicrobial therapy with meropenem. He would like to avoid surgery and is amenable to percutaneous cholecystostomy drain if needed. Continue JP drain and meropenem. Surgical intervention General Surgery/GI with supportive care as needed per primary team.   PLAN:  Continue meropenem.  MRCP and surgical intervention per General Surgery / GI.  Supportive care as needed per primary team.   Principal Problem:   Pelvic abscess in male [facility]) Active Problems:   Atrial fibrillation with rapid ventricular response (HCC)   SBO (small bowel obstruction) (HCC)   Leukocytosis   Hyperlipidemia   Thoracic aortic aneurysm (HCC)   Malnutrition of moderate degree    acetaminophen  650 mg Oral Q6H   Chlorhexidine Gluconate Cloth  6 each Topical Daily   diltiazem  120 mg Oral Q8H   feeding supplement  237 mL Oral TID BM   pantoprazole (PROTONIX) IV  40 mg Intravenous Q24H   sodium chloride flush  10-40 mL Intracatheter Q12H   sodium chloride flush  5 mL Intracatheter Q8H    SUBJECTIVE:   Afebrile overnight with no acute events. Feeling better since yesterday. No abdominal pain. No new concerns/complaints.   No Known Allergies   Review of Systems: Review of Systems  Constitutional:  Negative for chills, fever and weight loss.  Respiratory:  Negative for cough, shortness of breath and wheezing.   Cardiovascular:  Negative for chest pain and leg swelling.  Gastrointestinal:  Negative for abdominal pain, constipation, diarrhea, nausea and vomiting.  Skin:  Negative for rash.     OBJECTIVE: Vitals:   05/20/21 1521 05/20/21 2021 05/21/21 0315  05/21/21 0757  BP: 104/67 107/66 118/61 (!) 107/59  Pulse: 75 80 70 76  Resp:  '19 20 15  '$ Temp:  97.7 F (36.5 C) 97.7 F (36.5 C) 98.5 F (36.9 C)  TempSrc:  Oral Oral Oral  SpO2:    95%  Weight:      Height:       Body mass index is 24.11 kg/m.  Physical Exam Constitutional:      General: He is not in acute distress.    Appearance: He is well-developed.  Cardiovascular:     Rate and Rhythm: Normal rate and regular rhythm.     Heart sounds: Normal heart sounds.  Pulmonary:     Effort: Pulmonary effort is normal.     Breath sounds: Normal breath sounds.  Abdominal:     Comments: JP drain patent with scan drainage.   Skin:    General: Skin is warm and dry.  Neurological:     Mental Status: He is alert and oriented to person, place, and time.  Psychiatric:        Mood and Affect: Mood normal.    Lab Results Lab Results  Component Value Date   WBC 13.6 (H) 05/21/2021   HGB 11.7 (L) 05/21/2021   HCT 33.7 (L) 05/21/2021   MCV 90.6 05/21/2021   PLT 406 (H) 05/21/2021    Lab Results  Component Value Date   CREATININE 0.75 05/21/2021   BUN 17 05/21/2021   NA 129 (L) 05/21/2021   K 3.6 05/21/2021   CL  99 05/21/2021   CO2 23 05/21/2021    Lab Results  Component Value Date   ALT 467 (H) 05/21/2021   AST 430 (H) 05/21/2021   ALKPHOS 453 (H) 05/21/2021   BILITOT 4.0 (H) 05/21/2021     Microbiology: Recent Results (from the past 240 hour(s))  Aerobic/Anaerobic Culture w Gram Stain (surgical/deep wound)     Status: None   Collection Time: 05/13/21  4:49 PM   Specimen: Fluid; Abscess  Result Value Ref Range Status   Specimen Description FLUID PELVIS  Final   Special Requests NONE  Final   Gram Stain   Final    NO SQUAMOUS EPITHELIAL CELLS SEEN RARE WBC SEEN ABUNDANT GRAM NEGATIVE RODS FEW GRAM VARIABLE ROD    Culture   Final    MODERATE KLEBSIELLA OXYTOCA ABUNDANT BACTEROIDES FRAGILIS BETA LACTAMASE POSITIVE Performed at [facility], [place]  [address], [place], [place] [address]    Report Status 05/16/2021 FINAL  Final   Organism ID, Bacteria KLEBSIELLA OXYTOCA  Final      Susceptibility   Klebsiella oxytoca - MIC*    AMPICILLIN >=32 RESISTANT Resistant     CEFAZOLIN >=64 RESISTANT Resistant     CEFEPIME 1 SENSITIVE Sensitive     CEFTAZIDIME <=1 SENSITIVE Sensitive     CEFTRIAXONE 16 RESISTANT Resistant     CIPROFLOXACIN <=0.25 SENSITIVE Sensitive     GENTAMICIN <=1 SENSITIVE Sensitive     IMIPENEM <=0.25 SENSITIVE Sensitive     TRIMETH/SULFA <=20 SENSITIVE Sensitive     AMPICILLIN/SULBACTAM >=32 RESISTANT Resistant     PIP/TAZO >=128 RESISTANT Resistant     * MODERATE KLEBSIELLA OXYTOCA     Terri Piedra, NP [facility]  05/21/2021  11:39 AM

## 2021-05-21 NOTE — Progress Notes (Signed)
Referring Physician(s): Saverio Danker PA-C/CCS   Supervising Physician: Aletta Edouard  Patient Status:  [facility] - In-pt  Chief Complaint:  S/p pelvic abscess drain placement with Dr. Dwaine Gale on 05/14/21   Subjective:  Patient laying in bed, not in acute distress. States that he was told that he might have issue with the gallbladder, it seems like he is not making good progress.  Allergies: Patient has no known allergies.  Medications: Prior to Admission medications   Medication Sig Start Date End Date Taking? Authorizing Provider  aspirin EC 81 MG tablet Take 81 mg by mouth at bedtime.   Yes [provider]  atorvastatin (LIPITOR) 10 MG tablet Take 10 mg by mouth at bedtime. 08/14/15  Yes [provider]  diltiazem (CARDIZEM) 30 MG tablet TAKE 1 TABLET AS NEEDED FOR RAPID HEART BEAT Patient taking differently: Take 30 mg by mouth daily as needed (rapid heart beat). 10/06/17  Yes Isaiah Serge, NP  fluticasone (FLONASE) 50 MCG/ACT nasal spray Place 1 spray into both nostrils daily. 08/14/15  Yes [provider]  Multiple Vitamin (MULTIVITAMIN WITH MINERALS) TABS tablet Take 1 tablet by mouth daily.   Yes [provider]  Multiple Vitamins-Minerals (PRESERVISION AREDS 2) CAPS Take 1 capsule 2 (two) times daily by mouth.   Yes [provider]  VITAMIN E PO Take 1 capsule by mouth daily.    Yes [provider]     Vital Signs: BP (!) 107/59 (BP Location: Left Arm)   Pulse 76   Temp 98.5 F (36.9 C) (Oral)   Resp 15   Ht '5\' 11"'$  (1.803 m)   Wt 172 lb 13.5 oz (78.4 kg)   SpO2 95%   BMI 24.11 kg/m   Physical Exam Constitutional:      General: He is not in acute distress.    Appearance: Normal appearance. He is not ill-appearing.  HENT:     Head: Normocephalic and atraumatic.  Pulmonary:     Effort: Pulmonary effort is normal.  Abdominal:     General: Abdomen is flat.     Palpations: Abdomen is soft.  Skin:     General: Skin is warm and dry.     Coloration: Skin is not jaundiced or pale.     Comments: Positive R TG drain to a suction bulb. Site is unremarkable with no erythema, edema, tenderness, bleeding or drainage. Suture and stat lock in place. Dressing is clean, dry, and intact. 10 ml of  purulent tan colored fluid noted in the suction bulb. Drain aspirates and flushes well.    Neurological:     Mental Status: He is alert and oriented to person, place, and time.  Psychiatric:        Behavior: Behavior normal.    Imaging: CT ABDOMEN PELVIS W CONTRAST  Result Date: 05/19/2021 CLINICAL DATA:  Acute nonlocalized abdominal pain. Postop day 17 after open right inguinal hernia repair with mesh. EXAM: CT ABDOMEN AND PELVIS WITH CONTRAST TECHNIQUE: Multidetector CT imaging of the abdomen and pelvis was performed using the standard protocol following bolus administration of intravenous contrast. CONTRAST:  138m OMNIPAQUE IOHEXOL 300 MG/ML  SOLN COMPARISON:  05/13/2021 FINDINGS: Lower chest: Small right pleural effusion with basilar atelectasis, similar to prior study. Hepatobiliary: No focal liver lesions demonstrated. The gallbladder is distended with multiple stones. Gallbladder wall thickening and edema with pericholecystic stranding likely indicates acute cholecystitis. Intra and extrahepatic bile duct dilatation is present. Pancreas: Unremarkable. No pancreatic ductal dilatation or surrounding  inflammatory changes. Spleen: Normal in size without focal abnormality. Adrenals/Urinary Tract: No adrenal gland nodules. Large cyst in the upper pole left kidney. Nephrograms are otherwise homogeneous and symmetrical. No hydronephrosis or hydroureter. Bladder is unremarkable. Stomach/Bowel: The stomach, small bowel, and colon are not abnormally distended. Scattered stool throughout the colon. Left lower quadrant bowel anastomosis again demonstrated with mild hazy infiltration in the adjacent mesenteric fat. Small  collection adjacent to the anastomosis measures 2.3 cm. This collection is decreased in size since the previous study and the inflammatory infiltration is decreased. Loculated collection in the pelvis anterior to the rectum is again demonstrated although significantly decreased in size since prior study, today measuring 3.4 x 6.6 cm. There has been interval placement of a pigtail drainage catheter into this collection via trans sciatic approach. The catheter pigtail appears to be centrally positioned within the residual collection. Additional small collection adjacent to the descending colon measuring 1.8 cm diameter is unchanged. No new collections are identified. Left inguinal hernia containing small bowel without proximal obstruction. No change. Vascular/Lymphatic: Aortic atherosclerosis. No enlarged abdominal or pelvic lymph nodes. Reproductive: Prostate gland is enlarged. Other: Small amount of free fluid in the abdomen, likely reactive. No free air identified. Anterior abdominal wall subcutaneous defect is likely postoperative and unchanged. Subcutaneous edema in the pelvis. Musculoskeletal: Lumbar scoliosis convex towards the right with diffuse degenerative change. Right hip arthroplasty. IMPRESSION: 1. Small right pleural effusion with basilar atelectasis. 2. Gallbladder distention. Multiple gallstones with gallbladder wall thickening and edema and pericholecystic stranding. Bile duct dilatation. Changes may indicate acute cholecystitis. 3. Pigtail drainage catheter placed in a pelvic collection. Significant interval decrease in the size of this collection with additional smaller collections unchanged. Decreased infiltrative change in the mesentery. 4. Left inguinal hernia containing small bowel without proximal obstruction, unchanged. 5. Aortic atherosclerosis. 6. Enlarged prostate gland. Electronically Signed   By: Lucienne Capers M.D.   On: 05/19/2021 18:42   US Abdomen Limited RUQ (LIVER/GB)  Result  Date: 05/20/2021 CLINICAL DATA:  87 year old male with abnormal gallbladder on CT Abdomen and Pelvis. Abdominal pain, postop day 18 inguinal hernia repair with mesh. EXAM: ULTRASOUND ABDOMEN LIMITED RIGHT UPPER QUADRANT COMPARISON:  CT Abdomen and Pelvis 05/19/2021. FINDINGS: Gallbladder: Gallbladder distended with sludge (image 7) and there is abnormal gallbladder wall thickening up to 6 mm (image 10). Dependent shadowing stones, gravel-like on image 16. Positive sonographic Murphy sign. Common bile duct: Diameter: 8 mm, mildly dilated. Liver: No focal lesion identified. Within normal limits in parenchymal echogenicity. Mild intrahepatic biliary ductal dilatation was better demonstrated by CT. Portal vein is patent on color Doppler imaging with normal direction of blood flow towards the liver. Other: Negative visible right kidney. IMPRESSION: 1. Acute Cholecystitis: gallbladder distended with sludge and stones, thickened wall, and positive sonographic Murphy sign. 2. Mild intra- and extrahepatic biliary ductal dilatation suspicious for associated choledocholithiasis. Electronically Signed   By: Genevie Ann M.D.   On: 05/20/2021 11:25    Labs:  CBC: Recent Labs    05/18/21 0147 05/19/21 0137 05/20/21 0123 05/21/21 0350  WBC 25.9* 20.4* 20.2* 13.6*  HGB 11.4* 11.5* 11.9* 11.7*  HCT 33.4* 34.3* 34.3* 33.7*  PLT 316 379 389 406*    COAGS: No results for input(s): INR, APTT in the last 8760 hours.  BMP: Recent Labs    05/18/21 0147 05/19/21 0137 05/20/21 0123 05/21/21 0350  NA 130* 130* 127* 129*  K 4.3 4.2 3.5 3.6  CL 103 103 98 99  CO2 21* 21*  21* 23  GLUCOSE 134* 108* 112* 145*  BUN '18 18 16 17  '$ CALCIUM 8.1* 8.1* 8.1* 8.2*  CREATININE 0.61 0.79 0.67 0.75  GFRNONAA >60 >60 >60 >60    LIVER FUNCTION TESTS: Recent Labs    05/15/21 1027 05/19/21 0137 05/20/21 0123 05/21/21 0350  BILITOT 0.8 0.7 3.2* 4.0*  AST 78* 113* 354* 430*  ALT 104* 149* 380* 467*  ALKPHOS 99 149* 402*  453*  PROT 5.6* 5.8* 5.8* 6.1*  ALBUMIN 1.9* 1.6* 1.6* 1.7*    Assessment and Plan:  87 y.o. male s/p right inguinal hernia repair on 05/02/21 and small bowel resection on 8.12.22. CT abd from 05/13/21 showed  large rim enhancing fluid collection in the pelvis consistent with a pelvic abscess. S/p pelvic drain placement with Dr. Dwaine Gale on 05/13/21.   Patient underwent CT A/P on 8/22 due to minimal OP and reviewed with Dr. Laurence Ferrari who states that the collection is  smaller but still significant.  Recommended increase flushes for better irrigation, will consider drain upsize if OP remains minimal.   Pt stable, drain intact, flushes and aspirates well. Puncture site unremarkable, no s/s of bleeding or infection.  OP 23 cc on 8/23 ( 20 cc on 8/22) VSS WBC 13.6 today (20.2 yesterday) Cx Klebsiella Oxytoca   PLAN Continue with flushing TID with 10 cc, output recording q shift and dressing changes as needed. Would consider additional imaging when output is less than 10 ml for 24 hours not including flush material.    Further treatment plan per TRH/CCS/  Appreciate and agree with the plan.  IR to follow.    Electronically Signed: Tera Mater, PA-C 05/21/2021, 10:44 AM   I spent a total of 15 Minutes at the the patient's bedside AND on the patient's hospital floor or unit, greater than 50% of which was counseling/coordinating care for R TG drain

## 2021-05-21 NOTE — Progress Notes (Signed)
PT Cancellation Note  Patient Details Name: Gary Rice MRN: [id number] DOB: 26-Apr-1931   Cancelled Treatment:    Reason Eval/Treat Not Completed: (P) Patient at procedure or test/unavailable (pt off unit at MRI dept.) Attempted 2:30 but pt was plan to leave at any time for MRI, attempted again 5:25pm but pt still off unit. Will continue efforts per PT POC as schedule permits.   Kara Pacer Darrik Richman 05/21/2021, 5:27 PM

## 2021-05-21 NOTE — Progress Notes (Signed)
Patient's LFTs rising, WBC improving.  No abdominal pain.  Defer to surgery whether they feel MRCP is helpful, but whether or not choledocholithiasis is seen on an MRCP, I will not be pursuing ERCP at the present time for reasons outlined yesterday.  Defer to surgery whether to continue medical management alone vs gallbladder drain, but (despite increase LFTs), with no abdominal pain and downtrending leukocytosis, and in light of all patient has been through this admission, an argument could be made to pursue medical management alone for the present time.  Difficult case.  [facility] will follow.

## 2021-05-22 DIAGNOSIS — K651 Peritoneal abscess: Secondary | ICD-10-CM | POA: Diagnosis not present

## 2021-05-22 DIAGNOSIS — D72829 Elevated white blood cell count, unspecified: Secondary | ICD-10-CM | POA: Diagnosis not present

## 2021-05-22 DIAGNOSIS — K829 Disease of gallbladder, unspecified: Secondary | ICD-10-CM | POA: Diagnosis not present

## 2021-05-22 DIAGNOSIS — K460 Unspecified abdominal hernia with obstruction, without gangrene: Secondary | ICD-10-CM | POA: Diagnosis not present

## 2021-05-22 LAB — COMPREHENSIVE METABOLIC PANEL
ALT: 358 U/L — ABNORMAL HIGH (ref 0–44)
AST: 219 U/L — ABNORMAL HIGH (ref 15–41)
Albumin: 1.6 g/dL — ABNORMAL LOW (ref 3.5–5.0)
Alkaline Phosphatase: 364 U/L — ABNORMAL HIGH (ref 38–126)
Anion gap: 6 (ref 5–15)
BUN: 15 mg/dL (ref 8–23)
CO2: 23 mmol/L (ref 22–32)
Calcium: 8.2 mg/dL — ABNORMAL LOW (ref 8.9–10.3)
Chloride: 103 mmol/L (ref 98–111)
Creatinine, Ser: 0.74 mg/dL (ref 0.61–1.24)
GFR, Estimated: >60 mL/min (ref >60)
Glucose, Bld: 134 mg/dL — ABNORMAL HIGH (ref 70–99)
Potassium: 3.7 mmol/L (ref 3.5–5.1)
Sodium: 132 mmol/L — ABNORMAL LOW (ref 135–145)
Total Bilirubin: 1.4 mg/dL — ABNORMAL HIGH (ref 0.3–1.2)
Total Protein: 5.8 g/dL — ABNORMAL LOW (ref 6.5–8.1)

## 2021-05-22 LAB — CBC
HCT: 32.7 % — ABNORMAL LOW (ref 39.0–52.0)
Hemoglobin: 11.2 g/dL — ABNORMAL LOW (ref 13.0–17.0)
MCH: 31.1 pg (ref 26.0–34.0)
MCHC: 34.3 g/dL (ref 30.0–36.0)
MCV: 90.8 fL (ref 80.0–100.0)
Platelets: 403 10*3/uL — ABNORMAL HIGH (ref 150–400)
RBC: 3.6 MIL/uL — ABNORMAL LOW (ref 4.22–5.81)
RDW: 13.2 % (ref 11.5–15.5)
WBC: 14.1 10*3/uL — ABNORMAL HIGH (ref 4.0–10.5)
nRBC: 0 % (ref 0.0–0.2)

## 2021-05-22 LAB — GLUCOSE, CAPILLARY: Glucose-Capillary: 164 mg/dL — ABNORMAL HIGH (ref 70–99)

## 2021-05-22 MED ORDER — DIPHENHYDRAMINE HCL 25 MG PO CAPS
25.0000 mg | ORAL_CAPSULE | Freq: Once | ORAL | Status: AC | PRN
  Administered 2021-05-25: 25 mg via ORAL
  Filled 2021-05-22 (×2): qty 1

## 2021-05-22 NOTE — Therapy (Addendum)
Occupational Therapy Treatment Patient Details Name: Gary Rice MRN: [id number] DOB: Jul 31, 1931 Today's Date: 05/22/2021    History of present illness 87 yo male admitted 8/522 after being referred to ED by PCP secondary to incarcerated inguinal hernia causing SBO, also found to be in Afib with RVR in the ED. Received surgical repair of incarcerated inguinal hernia 05/02/21. NGT placed to wall suction 8/6. Drain placement 05/13/21. PMH hernia repair joint replacement.   OT comments  Pt states "Things are looking up". [id number] testing and he also reports "feeling better today". He was agreeable to bilateral UE exercises today using green/level 3 theraband. Performance of HEP should assist with increased activity tolerance, strength and conditioning in preparation for increased performance in ADL's and functional transfers. Pt required 1 vc for proper positioning during shoulder flexion ex. He is progressing toward acute OT goals.   Follow Up Recommendations  Home health OT;Supervision - Intermittent    Equipment Recommendations  Other (comment) (Rw)    Recommendations for Other Services      Precautions / Restrictions Precautions Precautions: Fall Precaution Comments: NG tube removed 05/15/21 Restrictions Weight Bearing Restrictions: No       Mobility Bed Mobility Overal bed mobility: Needs Assistance Bed Mobility:  (Reposition in bed)           General bed mobility comments: Pt was min A to scoot up to head of bed using overhead rails. Min assist using pad at hips.    Transfers   General transfer comment: Pt declined transfers OOB secondary to being up in recliner chair "for too long" yesterday"        ADL either performed or assessed with clinical judgement   ADL     General ADL Comments: Pt was agreeable to bilateral UE strengthening HEP using level 3 theraband sitting up in bed. Pt reports that he sat up for "too long" yesterday and is waiting for lunch to  arrive.    Cognition Arousal/Alertness: Awake/alert Behavior During Therapy: WFL for tasks assessed/performed Overall Cognitive Status: Within Functional Limits for tasks assessed        Exercises Exercises: General Upper Extremity (Theraband HEP was reviewed and performed using green theraband sitting up at bed level) General Exercises - Upper Extremity Shoulder Flexion: Strengthening;Both;10 reps;Other (comment);Theraband (Sitting up at bed level. VC/demonstration for proper positioning during performance "thumb up" facing ceiling.) Theraband Level (Shoulder Flexion): Level 3 (Green) Shoulder Extension: Strengthening;Both;10 reps;Theraband;Other (comment) (Sitting up HOB elevated) Theraband Level (Shoulder Extension): Level 3 (Green) Shoulder Horizontal ABduction: Strengthening;Both;10 reps;Other (comment);Theraband (Sitting up in bed) Theraband Level (Shoulder Horizontal Abduction): Level 3 (Green) Elbow Flexion: Strengthening;Both;10 reps;Theraband;Other (comment) (Sitting up HOB elevated) Theraband Level (Elbow Flexion): Level 3 (Green) Elbow Extension: Strengthening;Both;10 reps;Theraband;Other (comment) (Sitting up in bed HOB elevated) Theraband Level (Elbow Extension): Level 3 (Green) Other Exercises Other Exercises: Theraband use and precautions were reviewed (decrease reps or times per day PRN and let OT know). Pt verbalized understanding.           Pertinent Vitals/ Pain       Pain Assessment: No/denies pain Pain Score: 0-No pain   Frequency  Min 2X/week     Progress Toward Goals  OT Goals(current goals can now be found in the care plan section)  Progress towards OT goals: Progressing toward goals  Acute Rehab OT Goals Patient Stated Goal: To get better OT Goal Formulation: With patient Time For Goal Achievement: 06/03/21 Potential to Achieve Goals: Good  Plan Discharge plan remains appropriate;Frequency remains appropriate  AM-PAC OT "6 Clicks" Daily  Activity     Outcome Measure   Help from another person eating meals?: None Help from another person taking care of personal grooming?: A Little Help from another person toileting, which includes using toliet, bedpan, or urinal?: A Little Help from another person bathing (including washing, rinsing, drying)?: A Little Help from another person to put on and taking off regular upper body clothing?: A Little Help from another person to put on and taking off regular lower body clothing?: A Little 6 Click Score: 19    End of Session Equipment Utilized During Treatment: Other (comment) (Green theraband level 3)  OT Visit Diagnosis: Unsteadiness on feet (R26.81);Other abnormalities of gait and mobility (R26.89);Muscle weakness (generalized) (M62.81)   Activity Tolerance Patient tolerated treatment well   Patient Left in bed;with call bell/phone within reach;with bed alarm set    Time: [id number] OT Time Calculation (min): 27 min  Charges: OT General Charges $OT Visit: 1 Visit OT Treatments $Therapeutic Exercise: 23-37 mins   Almyra Deforest, OTR/L 05/22/2021, 12:29 PM

## 2021-05-22 NOTE — Progress Notes (Signed)
[facility]enterology Progress Note  [facility] y.o. Aug 01, 1931  CC:  Abnormal LFTs   Subjective: Patient denies any current abdominal pain.  Denies nausea or vomiting and is tolerating a diet.  Had a brown BM last night.  ROS : Review of Systems  Cardiovascular:  Negative for chest pain and palpitations.  Gastrointestinal:  Negative for abdominal pain, blood in stool, constipation, diarrhea, heartburn, melena, nausea and vomiting.     Objective: Vital signs in last 24 hours: Vitals:   05/22/21 0334 05/22/21 0725  BP: 115/67 138/82  Pulse: 75 89  Resp: 20 19  Temp: 98.1 F (36.7 C) 97.9 F (36.6 C)  SpO2: 97% 97%    Physical Exam:  General:  Alert, cooperative, no distress  Head:  Normocephalic, without obvious abnormality, atraumatic  Eyes:  Anicteric sclera, EOMs intact  Lungs:   Clear to auscultation bilaterally, respirations unlabored  Heart:  Regular rate and rhythm, S1, S2 normal  Abdomen:   Soft, non-tender, non-distended, normoactive bowel sounds; midline wound present  Extremities: No  edema    Lab Results: Recent Labs    05/21/21 0350 05/22/21 0226  NA 129* 132*  K 3.6 3.7  CL 99 103  CO2 23 23  GLUCOSE 145* 134*  BUN 17 15  CREATININE 0.75 0.74  CALCIUM 8.2* 8.2*   Recent Labs    05/21/21 0350 05/22/21 0226  AST 430* 219*  ALT 467* 358*  ALKPHOS 453* 364*  BILITOT 4.0* 1.4*  PROT 6.1* 5.8*  ALBUMIN 1.7* 1.6*   Recent Labs    05/21/21 0350 05/22/21 0226  WBC 13.6* 14.1*  NEUTROABS 11.0*  --   HGB 11.7* 11.2*  HCT 33.7* 32.7*  MCV 90.6 90.8  PLT 406* 403*   No results for input(s): LABPROT, INR in the last 72 hours.    Assessment: Abnormal LFTs: -T. Bili now 1.4, decreased from 4.0 yesterday -AST 219/ ALT 358/ ALP 364, downtrending -MRI/MRCP showed CBD 31m, without evidence of choledocholithiasis or significant intrahepatic biliary ductal dilation  Acute cholecystitis: MRI/MRCP yesterday revealed cholelithiasis,  layering sludge, gallbladder distension, moderate diffuse gallbladder wall thickening and small amount of pericholecystic fluid. Findings are compatible with acute cholecystitis.  Incarcerated inguinal hernia s/p laparotomy, post-operative ileus  Plan: Discussed with Dr. OPaulita Fujita Given down-trending T. Bili and LFTs, as well as normal MRI/MRCP without evidence of choledocholithiasis, as well as other ongoing active medical problems, defer ERCP at this time.  Further management as per surgical team.  [facility] will sign off. Please contact u[place]if we can be of any further assistance during this hospital stay.   ASalley SlaughterPA-C 05/22/2021, 10:44 AM  Contact #  [phone]

## 2021-05-22 NOTE — Progress Notes (Signed)
Physical Therapy Treatment Patient Details Name: Gary Rice MRN: [id number] DOB: [birth year] Today's Date: 05/22/2021    History of Present Illness 87 yo male admitted 8/522 after being referred to ED by PCP secondary to incarcerated inguinal hernia causing SBO, also found to be in Afib with RVR in the ED. Received surgical repair of incarcerated inguinal hernia 05/02/21. NGT placed to wall suction 8/6. Drain placement 05/13/21. PMH hernia repair joint replacement.    PT Comments    Pt received in supine, agreeable to therapy session and with good participation and tolerance for transfer and gait training. Pt needs increased assist for stability when standing from low surface (toilet) and continues to need support from hospital bed rails with bed mobility. Pt able to progress gait to household distances but needs increased time and gait speed remains slow <0.4 m/s indicating an increased risk of falls for household ambulation tasks. Pt slow to initiate/perform mobility tasks and reports he will not have any physical assist/supervision at home and was too fatigued to attempt stair trial this date. Pt continues to benefit from PT services to progress toward functional mobility goals.  Per discussion with pt and supervising PT Beth V, updated pt discharge recommendation to SNF.  Follow Up Recommendations  SNF;Supervision for mobility/OOB     Equipment Recommendations  Rolling walker with 5" wheels    Recommendations for Other Services       Precautions / Restrictions Precautions Precautions: Fall Precaution Comments: JP drain R abdomen Restrictions Weight Bearing Restrictions: No    Mobility  Bed Mobility Overal bed mobility: Needs Assistance Bed Mobility: Supine to Sit     Supine to sit: Min guard     General bed mobility comments: Pt was min A to scoot up to head of bed using overhead rails. Min assist using pad at hips. Min guard from supine with HOB elevated and cues for  line mgmt/awareness; heavy use of bed features/rails.    Transfers Overall transfer level: Needs assistance Equipment used: Rolling walker (2 wheeled) Transfers: Sit to/from Stand Sit to Stand: Min guard;Min assist         General transfer comment: from bed to RW with min guard but needs mod cues for safety/sequencing and x2 attempts to achieve upright; heavy reliance on wall railing when standing from toilet and minA from low toilet height to rise/steady  Ambulation/Gait Ambulation/Gait assistance: Min guard Gait Distance (Feet): 55 Feet (x3 with 2 rest breaks (leaning against wall), then 35f from toilet to chair) Assistive device: Rolling walker (2 wheeled) Gait Pattern/deviations: Step-through pattern;Decreased step length - right;Decreased step length - left;Decreased stride length;Trunk flexed     General Gait Details: slow, steady gait, min cues for posture and activity pacing, VSS on RA   Stairs Stairs:  (pt defers to attempt due to fatigue)           Balance Overall balance assessment: Needs assistance Sitting-balance support: Feet supported Sitting balance-Leahy Scale: Normal     Standing balance support: Bilateral upper extremity supported;During functional activity Standing balance-Leahy Scale: Poor Standing balance comment: Reliant on B UE support for steadying, Supervision for static standing and up to min guard for dynamic (when pt performs his own peri-care unsupported, etc); pt able to don face mask with min guard; Attempted to encourage gait trial with cane as pt used this previously but pt not agreeable to attempt.          Cognition Arousal/Alertness: Awake/alert Behavior During Therapy: WFL for tasks assessed/performed Overall  Cognitive Status: Within Functional Limits for tasks assessed          Exercises  Other Exercises Other Exercises: supine BLE AROM: ankle pumps, heel slides, hip abduction x5-10 reps ea Other Exercises: chair push-up  x3 reps from EOB (tricep strengthening) Other Exercises: IS x5 reps 1500-1750 mL    General Comments General comments (skin integrity, edema, etc.): pt HR 95-110 bpm with exertion (mostly 90's); HR 81 bpm resting and RR 21 rpm with exertion; no dizziness and BP stable during previous hour, not further assessed.      Pertinent Vitals/Pain Pain Assessment: Faces Pain Score: 0-No pain Faces Pain Scale: No hurt Pain Location: pt denies abdominal discomfort Pain Intervention(s): Monitored during session;Repositioned     PT Goals (current goals can now be found in the care plan section) Acute Rehab PT Goals Patient Stated Goal: To get better PT Goal Formulation: With patient Time For Goal Achievement: 05/28/21 Potential to Achieve Goals: Good Progress towards PT goals: Progressing toward goals    Frequency    Min 3X/week      PT Plan Discharge plan needs to be updated       AM-PAC PT "6 Clicks" Mobility   Outcome Measure  Help needed turning from your back to your side while in a flat bed without using bedrails?: None Help needed moving from lying on your back to sitting on the side of a flat bed without using bedrails?: A Little Help needed moving to and from a bed to a chair (including a wheelchair)?: A Little Help needed standing up from a chair using your arms (e.g., wheelchair or bedside chair)?: A Little Help needed to walk in hospital room?: A Little Help needed climbing 3-5 steps with a railing? : A Lot 6 Click Score: 18    End of Session Equipment Utilized During Treatment: Gait belt Activity Tolerance: Patient tolerated treatment well Patient left: in chair;with call bell/phone within reach;with chair alarm set Nurse Communication: Mobility status PT Visit Diagnosis: Muscle weakness (generalized) (M62.81);Difficulty in walking, not elsewhere classified (R26.2);Other abnormalities of gait and mobility (R26.89)     Time: [id number] PT Time Calculation (min)  (ACUTE ONLY): 36 min  Charges:  $Gait Training: 8-22 mins $Therapeutic Exercise: 8-22 mins                     Breleigh Carpino P., PTA Acute Rehabilitation Services Pager: [phone] Office: [place] 05/22/2021, 2:14 PM

## 2021-05-22 NOTE — TOC Progression Note (Signed)
Transition of Care [facility]) - Progression Note    Patient Details  Name: Gary Rice MRN: [id number] Date of Birth: 1930-10-31  Transition of Care [facility]) CM/SW [place], [place] Phone Number: 05/22/2021, 4:49 PM  Clinical Narrative:     CSW visit with patient and gave patient medicare.gov listing w/ bed offers. CSW advised the patient to select at least SNFs of interest. Patient states understanding. CSW explained unable to reach his wife and has left voice message. He requested CSW to try and call again.  CSW called patient's wife and left voice message.  Thurmond Butts, MSW, LCSW Clinical Social Worker    Expected Discharge Plan: Skilled Nursing Facility Barriers to Discharge: Continued Medical Work up, SNF Pending bed offer  Expected Discharge Plan and Services Expected Discharge Plan: [place] In-house Referral: Clinical Social Work Discharge Planning Services: CM Consult Post Acute Care Choice: NA Living arrangements for the past 2 months: Single Family Home                   DME Agency: NA       HH Arranged: RN, PT, OT HH Agency: [place] Date [place]: 05/07/21 Time [place]: 43 Representative spoke with at [place]: [place] ([place]) Interventions    Readmission Risk Interventions No flowsheet data found.

## 2021-05-22 NOTE — Progress Notes (Addendum)
[facility] PROGRESS NOTE    Progress Note  Gary Rice  [id number] DOB: 23-Jul-1931 DOA: 05/02/2021 PCP: Josetta Huddle, MD     Brief Narrative:   Gary Rice is an 87 y.o. male past medical history of SVT, hyperlipidemia bilateral ankle hernias came into the [facility] not feeling well found in new onset atrial fibrillation and SBL with incarcerated right inguinal hernia underwent urgent repair with mesh placement with small bowel resection and primary anastomosis.  During his hospitalization he did develop intra-abdominal infection with a CT scan of the abdomen and pelvis showing a large pelvic abscess, IR was consulted who placed a drain on 05/13/2021    Assessment/Plan:  Incarcerated right inguinal hernia with SBO: Status post surgical intervention on 05/03/2021 with mesh placement. Diagnostic laparoscopy performed on 05/09/2021 secondary to ongoing left lower quadrant abdominal pain, imaging show perforation status post bowel resection with primary anastomosis. Was briefly on TPN until 05/18/2021. Currently advancing his diet. Further management per surgery. Continue to work with physical therapy. Out of bed to chair.  Acute cholecystitis: General surgery was consulted recommended to consult GI for possible MRCP. Patient was deemed a poor surgical candidate.  GI is reluctant to pursue an ERCP unless he clinically deteriorates..  GI recommended to continue empiric antibiotics He is currently on IV meropenem. LFTs are improving today, RCP showed acute cholecystitis.   Intra-abdominal abscess: CT abdomen and pelvis on 03/18/2019 consistent with pelvic abscess. IR was consulted, performed CT-guided drainage from 04/13/2019 Wound culture grew bacteroids and MDR Klebsiella oxytoca repeated CT scan showed decrease in size and pelvic abscess, additional smaller collection shows no change. Continue IV meropenem.  Intractable nausea and vomiting: Required NG tube  placement temporarily now resolved.  New onset atrial fibrillation with RVR: Cardiology was consulted and he was treated with IV Cardizem and metoprolol transesophageal echo showed preserved EF. Currently rate controlled on Cardizem 120, hold Eliquis.  Dyspnea: Improved with position and incentive spirometry. Chest x-ray unremarkable. Ambulated Feb 15, 2021 dyspnea on exertion but saturations remain above 90.  Hyponatremia: Asymptomatic mild slowly improving.  Improved with IV fluids.  Hyperlipidemia: Continue statins.  Ascending aortic aneurysm: Continue follow-up with CT surgery as an outpatient.  Leukocytosis: Likely secondary to SBO incarceration.  Maculopapular rash:  question medication we will discuss with ID to see if we can change his antibiotics.  DVT prophylaxis: Eliquis Family Communication:none Status is: Inpatient  Remains inpatient appropriate because:Hemodynamically unstable  Dispo: The patient is from: Home              Anticipated d/c is to: Home              Patient currently is not medically stable to d/c.   Difficult to place patient No   Code Status:     Code Status Orders  (From admission, onward)           Start     Ordered   05/03/21 0017  Full code  Continuous        05/03/21 0016           Code Status History     This patient has a current code status but no historical code status.         IV Access:   Peripheral IV   Procedures and diagnostic studies:   MR ABDOMEN MRCP W WO CONTAST  Result Date: 05/21/2021 CLINICAL DATA:  Inpatient. Hyperbilirubinemia. Acute cholecystitis and mild biliary ductal dilatation on sonogram. Abdominal pain. Recent  right inguinal hernia mesh repair. Pelvic abscess status post percutaneous drainage. EXAM: MRI ABDOMEN WITHOUT AND WITH CONTRAST (INCLUDING MRCP) TECHNIQUE: Multiplanar multisequence MR imaging of the abdomen was performed both before and after the administration of intravenous  contrast. Heavily T2-weighted images of the biliary and pancreatic ducts were obtained, and three-dimensional MRCP images were rendered by post processing. CONTRAST:  7.11m GADAVIST GADOBUTROL 1 MMOL/ML IV SOLN COMPARISON:  05/19/2021 CT abdomen/pelvis and 05/20/2021 abdominal sonogram. FINDINGS: Severely motion degraded scan, limiting assessment. Lower chest: Small dependent right pleural effusion. Hepatobiliary: Relative atrophy of the left liver lobe, particularly the lateral segment left liver. Normal configuration of the right liver and caudate lobe. No hepatic steatosis. Scattered simple subcentimeter liver cysts. Numerous layering subcentimeter gallstones and layering sludge in the distended gallbladder with moderate diffuse gallbladder wall thickening. Small amount of pericholecystic fluid. No significant intrahepatic biliary ductal dilatation. The intrahepatic bile ducts in the atrophic lateral segment left liver lobe are irregular and beaded in contour on the MRCP sequence (series 8/image 121). Top-normal caliber common bile duct with diameter 6 mm. No evidence of a centrally obstructing left liver mass. No discrete biliary filling defects to suggest choledocholithiasis. Pancreas: No pancreatic mass or duct dilation.  No pancreas divisum. Spleen: Normal size. No mass. Adrenals/Urinary Tract: Normal adrenals. No hydronephrosis. Simple bilateral renal cysts, largest 9.7 cm in the posterior upper left kidney. No suspicious renal masses. Stomach/Bowel: Normal non-distended stomach. Visualized small and large bowel is normal caliber, with no bowel wall thickening. Vascular/Lymphatic: Atherosclerotic nonaneurysmal abdominal aorta. Patent portal, splenic, hepatic and renal veins. Retroaortic left renal vein. No pathologically enlarged lymph nodes in the abdomen. Other: No abdominal ascites or focal fluid collection. Musculoskeletal: No aggressive appearing focal osseous lesions. Moderate dextrocurvature of the  lumbar spine with associated advanced degenerative disc disease. IMPRESSION: 1. Limited motion degraded scan. 2. Cholelithiasis, layering sludge, gallbladder distension, moderate diffuse gallbladder wall thickening and small amount of pericholecystic fluid. Findings are compatible with acute cholecystitis. 3. Top-normal caliber common bile duct measuring 6 mm. No evidence of choledocholithiasis. No significant intrahepatic biliary ductal dilatation. 4. Relative atrophy of the lateral segment left liver lobe with associated irregular and beaded contour of the intrahepatic bile ducts in the lateral segment left liver lobe. No evidence of a centrally obstructing left liver mass. 5. Small dependent right pleural effusion. Electronically Signed   By: JIlona SorrelM.D.   On: 05/21/2021 21:29   [place]Abdomen Limited RUQ (LIVER/GB)  Result Date: 05/20/2021 CLINICAL DATA:  87year old male with abnormal gallbladder on CT Abdomen and Pelvis. Abdominal pain, postop day 18 inguinal hernia repair with mesh. EXAM: ULTRASOUND ABDOMEN LIMITED RIGHT UPPER QUADRANT COMPARISON:  CT Abdomen and Pelvis 05/19/2021. FINDINGS: Gallbladder: Gallbladder distended with sludge (image 7) and there is abnormal gallbladder wall thickening up to 6 mm (image 10). Dependent shadowing stones, gravel-like on image 16. Positive sonographic Murphy sign. Common bile duct: Diameter: 8 mm, mildly dilated. Liver: No focal lesion identified. Within normal limits in parenchymal echogenicity. Mild intrahepatic biliary ductal dilatation was better demonstrated by CT. Portal vein is patent on color Doppler imaging with normal direction of blood flow towards the liver. Other: Negative visible right kidney. IMPRESSION: 1. Acute Cholecystitis: gallbladder distended with sludge and stones, thickened wall, and positive sonographic Murphy sign. 2. Mild intra- and extrahepatic biliary ductal dilatation suspicious for associated choledocholithiasis. Electronically  Signed   By: HGenevie AnnM.D.   On: 05/20/2021 11:25     Medical Consultants:   None.  Subjective:    Gary Rice no complaints tolerating his diet.  Objective:    Vitals:   05/21/21 2103 05/21/21 2314 05/22/21 0334 05/22/21 0725  BP: 94/65 120/75 115/67 138/82  Pulse: 75 70 75 89  Resp: '17 18 20 19  '$ Temp: 98.1 F (36.7 C) 98.1 F (36.7 C) 98.1 F (36.7 C) 97.9 F (36.6 C)  TempSrc: Oral Oral Oral Oral  SpO2: 97% 97% 97% 97%  Weight:      Height:       SpO2: 97 % O2 Flow Rate (L/min): 2 L/min   Intake/Output Summary (Last 24 hours) at 05/22/2021 0808 Last data filed at 05/22/2021 0641 Gross per 24 hour  Intake 493.01 ml  Output 1420 ml  Net -926.99 ml    Filed Weights   05/02/21 1805 05/17/21 1242  Weight: 77.1 kg 78.4 kg    Exam: General exam: In no acute distress. Respiratory system: Good air movement and clear to auscultation. Cardiovascular system: S1 & S2 heard, RRR. No JVD. Gastrointestinal system: Abdomen is nondistended, soft and nontender.  Extremities: No pedal edema. Skin: No rashes, lesions or ulcers  Data Reviewed:    Labs: Basic Metabolic Panel: Recent Labs  Lab 05/15/21 1027 05/16/21 0241 05/18/21 0147 05/19/21 0137 05/20/21 0123 05/21/21 0350 05/22/21 0226  NA 133* 131* 130* 130* 127* 129* 132*  K 4.0 4.0 4.3 4.2 3.5 3.6 3.7  CL 106 103 103 103 98 99 103  CO2 22 21* 21* 21* 21* 23 23  GLUCOSE 134* 157* 134* 108* 112* 145* 134*  BUN '14 14 18 18 16 17 15  '$ CREATININE 0.58* 0.59* 0.61 0.79 0.67 0.75 0.74  CALCIUM 8.1* 8.3* 8.1* 8.1* 8.1* 8.2* 8.2*  MG 2.0 2.0  --   --   --   --   --   PHOS 3.1 3.0  --   --   --   --   --     GFR Estimated Creatinine Clearance: 68 mL/min (by C-G formula based on SCr of 0.74 mg/dL). Liver Function Tests: Recent Labs  Lab 05/15/21 1027 05/19/21 0137 05/20/21 0123 05/21/21 0350 05/22/21 0226  AST 78* 113* 354* 430* 219*  ALT 104* 149* 380* 467* 358*  ALKPHOS 99 149* 402* 453* 364*   BILITOT 0.8 0.7 3.2* 4.0* 1.4*  PROT 5.6* 5.8* 5.8* 6.1* 5.8*  ALBUMIN 1.9* 1.6* 1.6* 1.7* 1.6*    No results for input(s): LIPASE, AMYLASE in the last 168 hours. No results for input(s): AMMONIA in the last 168 hours. Coagulation profile No results for input(s): INR, PROTIME in the last 168 hours. COVID-19 Labs  No results for input(s): DDIMER, FERRITIN, LDH, CRP in the last 72 hours.  Lab Results  Component Value Date   Center Line NEGATIVE 05/02/2021    CBC: Recent Labs  Lab 05/18/21 0147 05/19/21 0137 05/20/21 0123 05/21/21 0350 05/22/21 0226  WBC 25.9* 20.4* 20.2* 13.6* 14.1*  NEUTROABS  --   --   --  11.0*  --   HGB 11.4* 11.5* 11.9* 11.7* 11.2*  HCT 33.4* 34.3* 34.3* 33.7* 32.7*  MCV 91.8 92.2 91.0 90.6 90.8  PLT 316 379 389 406* 403*    Cardiac Enzymes: No results for input(s): CKTOTAL, CKMB, CKMBINDEX, TROPONINI in the last 168 hours. BNP (last 3 results) No results for input(s): PROBNP in the last 8760 hours. CBG: Recent Labs  Lab 05/18/21 0000 05/18/21 0354 05/18/21 0747 05/18/21 1059 05/18/21 1714  GLUCAP 134* 154* 151* 151* 149*  D-Dimer: No results for input(s): DDIMER in the last 72 hours. Hgb A1c: No results for input(s): HGBA1C in the last 72 hours. Lipid Profile: No results for input(s): CHOL, HDL, LDLCALC, TRIG, CHOLHDL, LDLDIRECT in the last 72 hours. Thyroid function studies: No results for input(s): TSH, T4TOTAL, T3FREE, THYROIDAB in the last 72 hours.  Invalid input(s): FREET3 Anemia work up: No results for input(s): VITAMINB12, FOLATE, FERRITIN, TIBC, IRON, RETICCTPCT in the last 72 hours. Sepsis Labs: Recent Labs  Lab 05/19/21 0137 05/20/21 0123 05/21/21 0350 05/22/21 0226  WBC 20.4* 20.2* 13.6* 14.1*    Microbiology Recent Results (from the past 240 hour(s))  Aerobic/Anaerobic Culture w Gram Stain (surgical/deep wound)     Status: None (Preliminary result)   Collection Time: 05/13/21  4:49 PM   Specimen: Fluid;  Abscess  Result Value Ref Range Status   Specimen Description FLUID PELVIS  Final   Special Requests NONE  Final   Gram Stain   Final    NO SQUAMOUS EPITHELIAL CELLS SEEN RARE WBC SEEN ABUNDANT GRAM NEGATIVE RODS FEW GRAM VARIABLE ROD    Culture   Final    MODERATE KLEBSIELLA OXYTOCA ABUNDANT BACTEROIDES FRAGILIS BETA LACTAMASE POSITIVE CULTURE REINCUBATED FOR BETTER GROWTH Performed at [facility], [place] [address], [place], [place] [address]    Report Status PENDING  Incomplete   Organism ID, Bacteria KLEBSIELLA OXYTOCA  Final      Susceptibility   Klebsiella oxytoca - MIC*    AMPICILLIN >=32 RESISTANT Resistant     CEFAZOLIN >=64 RESISTANT Resistant     CEFEPIME 1 SENSITIVE Sensitive     CEFTAZIDIME <=1 SENSITIVE Sensitive     CEFTRIAXONE 16 RESISTANT Resistant     CIPROFLOXACIN <=0.25 SENSITIVE Sensitive     GENTAMICIN <=1 SENSITIVE Sensitive     IMIPENEM <=0.25 SENSITIVE Sensitive     TRIMETH/SULFA <=20 SENSITIVE Sensitive     AMPICILLIN/SULBACTAM >=32 RESISTANT Resistant     PIP/TAZO >=128 RESISTANT Resistant     * MODERATE KLEBSIELLA OXYTOCA     Medications:    acetaminophen  650 mg Oral Q6H   Chlorhexidine Gluconate Cloth  6 each Topical Daily   diltiazem  120 mg Oral Q8H   feeding supplement  237 mL Oral TID BM   pantoprazole (PROTONIX) IV  40 mg Intravenous Q24H   sodium chloride flush  10-40 mL Intracatheter Q12H   sodium chloride flush  5 mL Intracatheter Q8H   Continuous Infusions:  lactated ringers Stopped (05/18/21 0640)   meropenem (MERREM) IV 1 g (05/22/21 0640)   methocarbamol (ROBAXIN) IV     promethazine (PHENERGAN) injection (IM or IVPB) 70 mL/hr at 05/04/21 0230      LOS: 20 days   [facility]  05/22/2021, 8:08 AM

## 2021-05-22 NOTE — Progress Notes (Signed)
13 Days Post-Op  Subjective: CC: He reports no abdominal pain, n/v. Tolerating diet. Reports a solid, normal bm yesterday. Continues to pass flatus.   Objective: Vital signs in last 24 hours: Temp:  [97.7 F (36.5 C)-98.4 F (36.9 C)] 97.8 F (36.6 C) (08/25 1100) Pulse Rate:  [70-89] 78 (08/25 1100) Resp:  [17-20] 18 (08/25 1100) BP: (94-138)/(62-82) 106/70 (08/25 1100) SpO2:  [95 %-97 %] 95 % (08/25 1100) Last BM Date: 05/18/21  Intake/Output from previous day: 08/24 0701 - 08/25 0700 In: 613 [P.O.:120; I.V.:483] Out: 1920 [EYCXK:4818; Drains:5] Intake/Output this shift: Total I/O In: 240 [P.O.:240] Out: 300 [Urine:300]  PE: General: pleasant, WD, elderly male who is laying in bed in NAD Heart: regular  Lungs: Respiratory effort normal and nonlabored Abd: Soft, NT, mildly distended, +BS, midline wound with some fibrinous exudate in wound base and dehisced without evisceration. RLQ incision c/d/I. L inguinal hernia partially reducible and NT w/ no skin changes.  Ext:  No LE edema or calf tenderness Psych: A&Ox3  Skin: Blanchable MP rash on chest and abdomen as noted below in picture that is stable from picture yesterday.   Lab Results:  Recent Labs    05/21/21 0350 05/22/21 0226  WBC 13.6* 14.1*  HGB 11.7* 11.2*  HCT 33.7* 32.7*  PLT 406* 403*   BMET Recent Labs    05/21/21 0350 05/22/21 0226  NA 129* 132*  K 3.6 3.7  CL 99 103  CO2 23 23  GLUCOSE 145* 134*  BUN 17 15  CREATININE 0.75 0.74  CALCIUM 8.2* 8.2*   PT/INR No results for input(s): LABPROT, INR in the last 72 hours. CMP     Component Value Date/Time   NA 132 (L) 05/22/2021 0226   K 3.7 05/22/2021 0226   CL 103 05/22/2021 0226   CO2 23 05/22/2021 0226   GLUCOSE 134 (H) 05/22/2021 0226   BUN 15 05/22/2021 0226   CREATININE 0.74 05/22/2021 0226   CALCIUM 8.2 (L) 05/22/2021 0226   PROT 5.8 (L) 05/22/2021 0226   ALBUMIN 1.6 (L) 05/22/2021 0226   AST 219 (H) 05/22/2021 0226   ALT  358 (H) 05/22/2021 0226   ALKPHOS 364 (H) 05/22/2021 0226   BILITOT 1.4 (H) 05/22/2021 0226   GFRNONAA >60 05/22/2021 0226   GFRAA  08/14/2007 0312    >60        The eGFR has been calculated using the MDRD equation. This calculation has not been validated in all clinical   Lipase     Component Value Date/Time   LIPASE 27 05/02/2021 1806    Studies/Results: MR ABDOMEN MRCP W WO CONTAST  Result Date: 05/21/2021 CLINICAL DATA:  Inpatient. Hyperbilirubinemia. Acute cholecystitis and mild biliary ductal dilatation on sonogram. Abdominal pain. Recent right inguinal hernia mesh repair. Pelvic abscess status post percutaneous drainage. EXAM: MRI ABDOMEN WITHOUT AND WITH CONTRAST (INCLUDING MRCP) TECHNIQUE: Multiplanar multisequence MR imaging of the abdomen was performed both before and after the administration of intravenous contrast. Heavily T2-weighted images of the biliary and pancreatic ducts were obtained, and three-dimensional MRCP images were rendered by post processing. CONTRAST:  7.50m GADAVIST GADOBUTROL 1 MMOL/ML IV SOLN COMPARISON:  05/19/2021 CT abdomen/pelvis and 05/20/2021 abdominal sonogram. FINDINGS: Severely motion degraded scan, limiting assessment. Lower chest: Small dependent right pleural effusion. Hepatobiliary: Relative atrophy of the left liver lobe, particularly the lateral segment left liver. Normal configuration of the right liver and caudate lobe. No hepatic steatosis. Scattered simple subcentimeter liver cysts. Numerous layering subcentimeter  gallstones and layering sludge in the distended gallbladder with moderate diffuse gallbladder wall thickening. Small amount of pericholecystic fluid. No significant intrahepatic biliary ductal dilatation. The intrahepatic bile ducts in the atrophic lateral segment left liver lobe are irregular and beaded in contour on the MRCP sequence (series 8/image 121). Top-normal caliber common bile duct with diameter 6 mm. No evidence of a  centrally obstructing left liver mass. No discrete biliary filling defects to suggest choledocholithiasis. Pancreas: No pancreatic mass or duct dilation.  No pancreas divisum. Spleen: Normal size. No mass. Adrenals/Urinary Tract: Normal adrenals. No hydronephrosis. Simple bilateral renal cysts, largest 9.7 cm in the posterior upper left kidney. No suspicious renal masses. Stomach/Bowel: Normal non-distended stomach. Visualized small and large bowel is normal caliber, with no bowel wall thickening. Vascular/Lymphatic: Atherosclerotic nonaneurysmal abdominal aorta. Patent portal, splenic, hepatic and renal veins. Retroaortic left renal vein. No pathologically enlarged lymph nodes in the abdomen. Other: No abdominal ascites or focal fluid collection. Musculoskeletal: No aggressive appearing focal osseous lesions. Moderate dextrocurvature of the lumbar spine with associated advanced degenerative disc disease. IMPRESSION: 1. Limited motion degraded scan. 2. Cholelithiasis, layering sludge, gallbladder distension, moderate diffuse gallbladder wall thickening and small amount of pericholecystic fluid. Findings are compatible with acute cholecystitis. 3. Top-normal caliber common bile duct measuring 6 mm. No evidence of choledocholithiasis. No significant intrahepatic biliary ductal dilatation. 4. Relative atrophy of the lateral segment left liver lobe with associated irregular and beaded contour of the intrahepatic bile ducts in the lateral segment left liver lobe. No evidence of a centrally obstructing left liver mass. 5. Small dependent right pleural effusion. Electronically Signed   By: Ilona Sorrel M.D.   On: 05/21/2021 21:29    Anti-infectives: Anti-infectives (From admission, onward)    Start     Dose/Rate Route Frequency Ordered Stop   05/16/21 1745  meropenem (MERREM) 1 g in sodium chloride 0.9 % 100 mL IVPB        1 g 200 mL/hr over 30 Minutes Intravenous Every 8 hours 05/16/21 1655     05/13/21 1515   piperacillin-tazobactam (ZOSYN) IVPB 3.375 g  Status:  Discontinued        3.375 g 12.5 mL/hr over 240 Minutes Intravenous Every 8 hours 05/13/21 1421 05/16/21 1640   05/09/21 0915  ceFAZolin (ANCEF) IVPB 2g/100 mL premix        2 g 200 mL/hr over 30 Minutes Intravenous On call to O.R. 05/09/21 0825 05/09/21 1050        Assessment/Plan POD 20, s/p open right inguinal hernia repair with mesh by Dr. Bobbye Morton for incarcerated RIH with persistent ileus vs SBO POD 12, s/p dx lap with mini laparotomy with SBR, Dr. Donne Hazel 05/09/21 - S/p IR drainage 8/16 for IA fluid collection. Cx's w/ Klebsiella Oxytoca and abx switched to merrem 8/19 per s/s. CT 8/22 w/ decrease in collection size, now 3.4 x 6.6. IR increasing flushes and considering upsizing  - TID WTD for midline wound  - IS/pulm toilet - Mobilize TID in halls. HH vs SNF   Acute cholecystitis - GI following. MRCP negative for choledocholithiasis. LFT's downtrending - WBC improved yesterday and stable today. Patient with no pain on exam and tolerating a diet without n/v. LFT's downtrending as noted below. Continue to treat conservatively w/ abx. If patient has worsening wbc, lft's or develops pain would recommend perc chole drainage.    FEN - On reg diet   VTE - SCDs, ok for heparin gtt while oral anticoagulation is on hold  ID - Merrem (ID following)   MP Rash - discussed with TRH who is evaluating  New onset a fib - on cardizem Ascending thoracic aneurysm HLD   LOS: 20 days    Jillyn Ledger , [facility] 05/22/2021, 11:12 AM Please see Amion for pager number during day hours 7:00am-4:30pm

## 2021-05-22 NOTE — Progress Notes (Addendum)
Gary Rice for Infectious Disease   I have seen and examined the patient. I have personally reviewed the clinical findings, laboratory findings, microbiological data and imaging studies. The assessment and treatment plan was discussed with the  APP, Terri Piedra.   I agree with their recommendations with the following comments:  Patient had MRCP done yesterday which showed acute cholecystitis but no choledocholithiasis.  Lab work this morning reassuring with decreased LFTs and total bilirubin.  Remains afebrile over the last 24 hours with T-max 98.4.  WBC stable at 14.1.  No new micro available.  His differential obtained yesterday was without significant peripheral eosinophilia, however, continues to have rash which has raised the concern for drug rash although not totally consistent with that.  Query whether this could be related to dermatitis from his gown which was recently changed to cotton versus from chlorhexidine wipes.  Plan to continue meropenem for now. Monitor rash and consider holding chlorhexidine wipes to see if this results in any improvement.   If rash worsens and we are forced to make an antibiotic change options include ciprofloxacin or TMP SMX which present their own risks and side effects given age and comorbidity.   Microbiology is also working on susceptibilities to additional antibiotics such as Eravacycline or Omadacycline.    Raynelle Highland for Infectious Disease [facility] 05/22/2021, 1:15 PM  Date of Admission:  05/02/2021     Total days of antibiotics 11         ASSESSMENT:  Gary Rice MRI showed acute cholecystitis and lab work with decreasing LFTs.  Clinically appears to be making positive progress however continues to have a maculopapular rash on his anterior trunk/pelvis and axillas. Source of rash unclear. Does not have any eosinophilia on differential. Nursing recently changed gown to cotton. Appears unlikely this  is related to his meropenem although it cannot be ruled out. Would anticipate face, arm and legs to be included. Alternative source may chlorhexidine wipes or previous gown. Would ideally hold chlorhexidine wipes to see if there is any improvement if any changes made. Alternative antibiotics include flouroquinolone or sulfamethoxazole/trimethoprim however these may come with increased renal and other side effects given age. Will continue current dose of meropenem for now.   PLAN:  Continue current dose of meropenem.  Monitor rash and consider holding chlorhexadine or simply monitoring for improvement/resolution. Drain management per IR.  Cholecystis management per General Surgery. Remaining supportive care per primary team.   Principal Problem:   Pelvic abscess in male [facility]) Active Problems:   Atrial fibrillation with rapid ventricular response (HCC)   SBO (small bowel obstruction) (HCC)   Leukocytosis   Hyperlipidemia   Thoracic aortic aneurysm (HCC)   Malnutrition of moderate degree    acetaminophen  650 mg Oral Q6H   Chlorhexidine Gluconate Cloth  6 each Topical Daily   diltiazem  120 mg Oral Q8H   feeding supplement  237 mL Oral TID BM   pantoprazole (PROTONIX) IV  40 mg Intravenous Q24H   sodium chloride flush  10-40 mL Intracatheter Q12H   sodium chloride flush  5 mL Intracatheter Q8H    SUBJECTIVE:  Afebrile overnight with no acute events. Continues to have rash located primarily on anterior trunk and axilla. Felt better after bath and lotion last night. No abdominal pain.  No Known Allergies   Review of Systems: Review of Systems  Constitutional:  Negative for chills, fever and weight loss.  Respiratory:  Negative for  cough, shortness of breath and wheezing.   Cardiovascular:  Negative for chest pain and leg swelling.  Gastrointestinal:  Negative for abdominal pain, constipation, diarrhea, nausea and vomiting.  Skin:  Positive for rash.     OBJECTIVE: Vitals:    05/21/21 2314 05/22/21 0334 05/22/21 0725 05/22/21 1100  BP: 120/75 115/67 138/82 106/70  Pulse: 70 75 89 78  Resp: '18 20 19 18  '$ Temp: 98.1 F (36.7 C) 98.1 F (36.7 C) 97.9 F (36.6 C) 97.8 F (36.6 C)  TempSrc: Oral Oral Oral Oral  SpO2: 97% 97% 97% 95%  Weight:      Height:       Body mass index is 24.11 kg/m.  Physical Exam Constitutional:      General: Gary Rice is not in acute distress.    Appearance: Gary Rice is well-developed.     Comments: Lying in bed with head of bed elevated; pleasant.   Cardiovascular:     Rate and Rhythm: Normal rate and regular rhythm.     Heart sounds: Normal heart sounds.  Pulmonary:     Effort: Pulmonary effort is normal.     Breath sounds: Normal breath sounds.  Skin:    General: Skin is warm and dry.     Comments: Maculopapular rash located on anterior trunk/pelvis and bilateral axillas.   Neurological:     Mental Status: Gary Rice is alert and oriented to person, place, and time.  Psychiatric:        Behavior: Behavior normal.        Thought Content: Thought content normal.        Judgment: Judgment normal.    Lab Results Lab Results  Component Value Date   WBC 14.1 (H) 05/22/2021   HGB 11.2 (L) 05/22/2021   HCT 32.7 (L) 05/22/2021   MCV 90.8 05/22/2021   PLT 403 (H) 05/22/2021    Lab Results  Component Value Date   CREATININE 0.74 05/22/2021   BUN 15 05/22/2021   NA 132 (L) 05/22/2021   K 3.7 05/22/2021   CL 103 05/22/2021   CO2 23 05/22/2021    Lab Results  Component Value Date   ALT 358 (H) 05/22/2021   AST 219 (H) 05/22/2021   ALKPHOS 364 (H) 05/22/2021   BILITOT 1.4 (H) 05/22/2021     Microbiology: Recent Results (from the past 240 hour(s))  Aerobic/Anaerobic Culture w Gram Stain (surgical/deep wound)     Status: None (Preliminary result)   Collection Time: 05/13/21  4:49 PM   Specimen: Fluid; Abscess  Result Value Ref Range Status   Specimen Description FLUID PELVIS  Final   Special Requests NONE  Final   Gram Stain    Final    NO SQUAMOUS EPITHELIAL CELLS SEEN RARE WBC SEEN ABUNDANT GRAM NEGATIVE RODS FEW GRAM VARIABLE ROD    Culture   Final    MODERATE KLEBSIELLA OXYTOCA ABUNDANT BACTEROIDES FRAGILIS BETA LACTAMASE POSITIVE CULTURE REINCUBATED FOR BETTER GROWTH Performed at [facility], [place] [address], [place], [place] [address]    Report Status PENDING  Incomplete   Organism ID, Bacteria KLEBSIELLA OXYTOCA  Final      Susceptibility   Klebsiella oxytoca - MIC*    AMPICILLIN >=32 RESISTANT Resistant     CEFAZOLIN >=64 RESISTANT Resistant     CEFEPIME 1 SENSITIVE Sensitive     CEFTAZIDIME <=1 SENSITIVE Sensitive     CEFTRIAXONE 16 RESISTANT Resistant     CIPROFLOXACIN <=0.25 SENSITIVE Sensitive     GENTAMICIN <=1  SENSITIVE Sensitive     IMIPENEM <=0.25 SENSITIVE Sensitive     TRIMETH/SULFA <=20 SENSITIVE Sensitive     AMPICILLIN/SULBACTAM >=32 RESISTANT Resistant     PIP/TAZO >=128 RESISTANT Resistant     * MODERATE KLEBSIELLA OXYTOCA     Terri Piedra, NP [facility]  05/22/2021  11:54 AM

## 2021-05-23 ENCOUNTER — Ambulatory Visit (HOSPITAL_COMMUNITY): Payer: Medicare Other | Admitting: Physician Assistant

## 2021-05-23 DIAGNOSIS — K829 Disease of gallbladder, unspecified: Secondary | ICD-10-CM | POA: Diagnosis not present

## 2021-05-23 DIAGNOSIS — K651 Peritoneal abscess: Secondary | ICD-10-CM | POA: Diagnosis not present

## 2021-05-23 LAB — COMPREHENSIVE METABOLIC PANEL
ALT: 243 U/L — ABNORMAL HIGH (ref 0–44)
AST: 85 U/L — ABNORMAL HIGH (ref 15–41)
Albumin: 1.8 g/dL — ABNORMAL LOW (ref 3.5–5.0)
Alkaline Phosphatase: 366 U/L — ABNORMAL HIGH (ref 38–126)
Anion gap: 7 (ref 5–15)
BUN: 16 mg/dL (ref 8–23)
CO2: 23 mmol/L (ref 22–32)
Calcium: 8.4 mg/dL — ABNORMAL LOW (ref 8.9–10.3)
Chloride: 102 mmol/L (ref 98–111)
Creatinine, Ser: 0.68 mg/dL (ref 0.61–1.24)
GFR, Estimated: >60 mL/min (ref >60)
Glucose, Bld: 122 mg/dL — ABNORMAL HIGH (ref 70–99)
Potassium: 3.9 mmol/L (ref 3.5–5.1)
Sodium: 132 mmol/L — ABNORMAL LOW (ref 135–145)
Total Bilirubin: 1.2 mg/dL (ref 0.3–1.2)
Total Protein: 6 g/dL — ABNORMAL LOW (ref 6.5–8.1)

## 2021-05-23 LAB — CBC
HCT: 33.5 % — ABNORMAL LOW (ref 39.0–52.0)
Hemoglobin: 11.4 g/dL — ABNORMAL LOW (ref 13.0–17.0)
MCH: 30.8 pg (ref 26.0–34.0)
MCHC: 34 g/dL (ref 30.0–36.0)
MCV: 90.5 fL (ref 80.0–100.0)
Platelets: 421 10*3/uL — ABNORMAL HIGH (ref 150–400)
RBC: 3.7 MIL/uL — ABNORMAL LOW (ref 4.22–5.81)
RDW: 13.4 % (ref 11.5–15.5)
WBC: 14.7 10*3/uL — ABNORMAL HIGH (ref 4.0–10.5)
nRBC: 0 % (ref 0.0–0.2)

## 2021-05-23 NOTE — TOC Progression Note (Signed)
Transition of Care (TOC) - Progression Note  Marvetta Gibbons RN, BSN Transitions of Care Unit 4E- RN Case Manager See Treatment Team for direct phone #    Patient Details  Name: Gary Rice MRN: [id number] Date of Birth: 1931/09/07  Transition of Care [facility]) CM/SW Contact  Dahlia Client, Romeo Rabon, RN Phone Number: 05/23/2021, 4:48 PM  Clinical Narrative:    Received call from [facility] with [place] rehab regarding pt being potential candidate as well as Lauren with [facility]- CM spoke with pt at bedside regarding transition plan. Per pt he was been speaking with CSW for SNF rehab options- Discussed with pt his potential rehab options and what he might like to explore- at this time pt voiced that he would like to look at CIR to see if he may be a candidate for that rehab option vs SNF.  Call made back to [facility] with [facility] for them to f/u with pt and CIR consult.  TOC to follow (CIR vs SNF at this time)   Expected Discharge Plan: [place] Barriers to Discharge: Continued Medical Work up, SNF Pending bed offer  Expected Discharge Plan and Services Expected Discharge Plan: [place] In-house Referral: Clinical Social Work Discharge Planning Services: CM Consult Post Acute Care Choice: NA Living arrangements for the past 2 months: Single Family Home                   DME Agency: NA       HH Arranged: RN, PT, OT HH Agency: [place] Date [place]: 05/07/21 Time [place]: 74 Representative spoke with at [place]: [place] ([place]) Interventions    Readmission Risk Interventions No flowsheet data found.

## 2021-05-23 NOTE — Progress Notes (Signed)
Progress Note  14 Days Post-Op  Subjective: Patient denies abdominal pain. He is tolerating diet without n/v. He is having bowel function. He is hopeful to get to inpatient rehab soon.   Objective: Vital signs in last 24 hours: Temp:  [97.5 F (36.4 C)-98.6 F (37 C)] 98.6 F (37 C) (08/26 0841) Pulse Rate:  [78-82] 82 (08/26 0841) Resp:  [13-18] 13 (08/26 0841) BP: (106-133)/(62-80) 108/62 (08/26 0841) SpO2:  [93 %-96 %] 93 % (08/26 0841) Last BM Date: 05/21/21  Intake/Output from previous day: 08/25 0701 - 08/26 0700 In: 1277 [P.O.:477; IV Piggyback:800] Out: 1800 [Urine:1775; Drains:25] Intake/Output this shift: Total I/O In: 240 [P.O.:240] Out: 300 [Urine:300]  PE: General: pleasant, WD, elderly male who is laying in bed in NAD Heart: regular  Lungs: Respiratory effort normal and nonlabored Abd: Soft, NT, mildly distended, +BS, midline wound with some fibrinous exudate in wound base and dehisced without evisceration. RLQ incision c/d/I. L inguinal hernia partially reducible and NT w/ no skin changes.  Ext:  No LE edema or calf tenderness Psych: A&Ox3    Lab Results:  Recent Labs    05/22/21 0226 05/23/21 0302  WBC 14.1* 14.7*  HGB 11.2* 11.4*  HCT 32.7* 33.5*  PLT 403* 421*   BMET Recent Labs    05/22/21 0226 05/23/21 0302  NA 132* 132*  K 3.7 3.9  CL 103 102  CO2 23 23  GLUCOSE 134* 122*  BUN 15 16  CREATININE 0.74 0.68  CALCIUM 8.2* 8.4*   PT/INR No results for input(s): LABPROT, INR in the last 72 hours. CMP     Component Value Date/Time   NA 132 (L) 05/23/2021 0302   K 3.9 05/23/2021 0302   CL 102 05/23/2021 0302   CO2 23 05/23/2021 0302   GLUCOSE 122 (H) 05/23/2021 0302   BUN 16 05/23/2021 0302   CREATININE 0.68 05/23/2021 0302   CALCIUM 8.4 (L) 05/23/2021 0302   PROT 6.0 (L) 05/23/2021 0302   ALBUMIN 1.8 (L) 05/23/2021 0302   AST 85 (H) 05/23/2021 0302   ALT 243 (H) 05/23/2021 0302   ALKPHOS 366 (H) 05/23/2021 0302   BILITOT  1.2 05/23/2021 0302   GFRNONAA >60 05/23/2021 0302   GFRAA  08/14/2007 0312    >60        The eGFR has been calculated using the MDRD equation. This calculation has not been validated in all clinical   Lipase     Component Value Date/Time   LIPASE 27 05/02/2021 1806       Studies/Results: MR ABDOMEN MRCP W WO CONTAST  Result Date: 05/21/2021 CLINICAL DATA:  Inpatient. Hyperbilirubinemia. Acute cholecystitis and mild biliary ductal dilatation on sonogram. Abdominal pain. Recent right inguinal hernia mesh repair. Pelvic abscess status post percutaneous drainage. EXAM: MRI ABDOMEN WITHOUT AND WITH CONTRAST (INCLUDING MRCP) TECHNIQUE: Multiplanar multisequence MR imaging of the abdomen was performed both before and after the administration of intravenous contrast. Heavily T2-weighted images of the biliary and pancreatic ducts were obtained, and three-dimensional MRCP images were rendered by post processing. CONTRAST:  7.72m GADAVIST GADOBUTROL 1 MMOL/ML IV SOLN COMPARISON:  05/19/2021 CT abdomen/pelvis and 05/20/2021 abdominal sonogram. FINDINGS: Severely motion degraded scan, limiting assessment. Lower chest: Small dependent right pleural effusion. Hepatobiliary: Relative atrophy of the left liver lobe, particularly the lateral segment left liver. Normal configuration of the right liver and caudate lobe. No hepatic steatosis. Scattered simple subcentimeter liver cysts. Numerous layering subcentimeter gallstones and layering sludge in the distended gallbladder with  moderate diffuse gallbladder wall thickening. Small amount of pericholecystic fluid. No significant intrahepatic biliary ductal dilatation. The intrahepatic bile ducts in the atrophic lateral segment left liver lobe are irregular and beaded in contour on the MRCP sequence (series 8/image 121). Top-normal caliber common bile duct with diameter 6 mm. No evidence of a centrally obstructing left liver mass. No discrete biliary filling defects  to suggest choledocholithiasis. Pancreas: No pancreatic mass or duct dilation.  No pancreas divisum. Spleen: Normal size. No mass. Adrenals/Urinary Tract: Normal adrenals. No hydronephrosis. Simple bilateral renal cysts, largest 9.7 cm in the posterior upper left kidney. No suspicious renal masses. Stomach/Bowel: Normal non-distended stomach. Visualized small and large bowel is normal caliber, with no bowel wall thickening. Vascular/Lymphatic: Atherosclerotic nonaneurysmal abdominal aorta. Patent portal, splenic, hepatic and renal veins. Retroaortic left renal vein. No pathologically enlarged lymph nodes in the abdomen. Other: No abdominal ascites or focal fluid collection. Musculoskeletal: No aggressive appearing focal osseous lesions. Moderate dextrocurvature of the lumbar spine with associated advanced degenerative disc disease. IMPRESSION: 1. Limited motion degraded scan. 2. Cholelithiasis, layering sludge, gallbladder distension, moderate diffuse gallbladder wall thickening and small amount of pericholecystic fluid. Findings are compatible with acute cholecystitis. 3. Top-normal caliber common bile duct measuring 6 mm. No evidence of choledocholithiasis. No significant intrahepatic biliary ductal dilatation. 4. Relative atrophy of the lateral segment left liver lobe with associated irregular and beaded contour of the intrahepatic bile ducts in the lateral segment left liver lobe. No evidence of a centrally obstructing left liver mass. 5. Small dependent right pleural effusion. Electronically Signed   By: Ilona Sorrel M.D.   On: 05/21/2021 21:29    Anti-infectives: Anti-infectives (From admission, onward)    Start     Dose/Rate Route Frequency Ordered Stop   05/16/21 1745  meropenem (MERREM) 1 g in sodium chloride 0.9 % 100 mL IVPB        1 g 200 mL/hr over 30 Minutes Intravenous Every 8 hours 05/16/21 1655     05/13/21 1515  piperacillin-tazobactam (ZOSYN) IVPB 3.375 g  Status:  Discontinued         3.375 g 12.5 mL/hr over 240 Minutes Intravenous Every 8 hours 05/13/21 1421 05/16/21 1640   05/09/21 0915  ceFAZolin (ANCEF) IVPB 2g/100 mL premix        2 g 200 mL/hr over 30 Minutes Intravenous On call to O.R. 05/09/21 0825 05/09/21 1050        Assessment/Plan POD 21, s/p open right inguinal hernia repair with mesh by Dr. Bobbye Morton for incarcerated RIH with persistent ileus vs SBO POD 13, s/p dx lap with mini laparotomy with SBR, Dr. Donne Hazel 05/09/21 - S/p IR drainage 8/16 for IA fluid collection. Cx's w/ Klebsiella Oxytoca and abx switched to merrem 8/19 per s/s. CT 8/22 w/ decrease in collection size, now 3.4 x 6.6. IR increasing flushes and considering upsizing  - TID WTD for midline wound  - IS/pulm toilet - Mobilize TID in halls. HH vs SNF   Acute cholecystitis - GI following. MRCP negative for choledocholithiasis. LFT's downtrending - WBC stable. Patient with no pain on exam and tolerating a diet without n/v. LFT's downtrending as noted below. Continue to treat conservatively w/ abx. If patient has worsening wbc, lft's or develops pain would recommend perc chole drainage.    FEN - On reg diet   VTE - SCDs, ok for heparin gtt while oral anticoagulation is on hold  ID - Merrem (ID following)   MP Rash - per [facility] New  onset a fib - on cardizem Ascending thoracic aneurysm HLD  LOS: 21 days    Norm Parcel, [facility] Surgery 05/23/2021, 9:58 AM Please see Amion for pager number during day hours 7:00am-4:30pm

## 2021-05-23 NOTE — Progress Notes (Signed)
[facility] PROGRESS NOTE    Progress Note  Stehen Donati Ku [facility]  [id number] DOB: August 12, 1931 DOA: 05/02/2021 PCP: Josetta Huddle, MD     Brief Narrative:   Gary Rice is an 87 y.o. male past medical history of SVT, hyperlipidemia bilateral ankle hernias came into the hospital not feeling well found in new onset atrial fibrillation and  SBO. Status post surgical intervention on 05/03/2021 with mesh placement.  He had ongoing abdominal pain so CT scan of the abdomen pelvis was done that showed perforation, general surgery proceeded with diagnostic laparoscopy performed on 05/09/2021 and he is status post bowel resection with primary anastomosis. Was briefly on TPN until 05/18/2021. During his hospitalization he did develop intra-abdominal infection with a CT scan of the abdomen and pelvis showing a large pelvic abscess, IR was consulted who placed a drain on 05/13/2021   Assessment/Plan:  Incarcerated right inguinal hernia with SBO: He is currently on a regular diet further management per surgery. Continue to work with physical therapy. Out of bed to chair.  Acute cholecystitis: General surgery was consulted recommended to consult GI for possible ERCP. MRCP show acute cholecystitis without choledocholithiasis. His LFTs are improving his abdominal pain is improve surgery recommended to continue empiric antibiotics He is currently on IV meropenem. LFT's continues to improve. General surgery to dictate length of antibiotic regimen.  Intra-abdominal abscess: Wound culture grew bacteroids and MDR Klebsiella oxytoca repeated CT scan showed decrease in size and pelvic abscess, additional smaller collection shows no change. Continue IV meropenem.  Intractable nausea and vomiting: Required NG tube placement temporarily now resolved.  New onset atrial fibrillation with RVR: Cardiology was consulted and he was treated with IV Cardizem and metoprolol transesophageal echo showed  preserved EF. Currently rate controlled on Cardizem 120, hold Eliquis.  Dyspnea: Improved with position and incentive spirometry. Chest x-ray unremarkable. Ambulated Feb 15, 2021 dyspnea on exertion but saturations remain above 90.  Hyponatremia: Asymptomatic mild slowly improving.  Improved with IV fluids.  Hyperlipidemia: Continue statins.  Ascending aortic aneurysm: Continue follow-up with CT surgery as an outpatient.  Leukocytosis: Continues to have persistent leukocytosis likely due to acute cholecystitis  Maculopapular rash:  question medication we will discuss with ID to see if we can change his antibiotics.  DVT prophylaxis: Eliquis Family Communication:none Status is: Inpatient  Remains inpatient appropriate because:Hemodynamically unstable  Dispo: The patient is from: Home              Anticipated d/c is to: Home              Patient currently is not medically stable to d/c.   Difficult to place patient No   Code Status:     Code Status Orders  (From admission, onward)           Start     Ordered   05/03/21 0017  Full code  Continuous        05/03/21 0016           Code Status History     This patient has a current code status but no historical code status.         IV Access:   Peripheral IV   Procedures and diagnostic studies:   MR ABDOMEN MRCP W WO CONTAST  Result Date: 05/21/2021 CLINICAL DATA:  Inpatient. Hyperbilirubinemia. Acute cholecystitis and mild biliary ductal dilatation on sonogram. Abdominal pain. Recent right inguinal hernia mesh repair. Pelvic abscess status post percutaneous drainage. EXAM: MRI ABDOMEN WITHOUT AND WITH  CONTRAST (INCLUDING MRCP) TECHNIQUE: Multiplanar multisequence MR imaging of the abdomen was performed both before and after the administration of intravenous contrast. Heavily T2-weighted images of the biliary and pancreatic ducts were obtained, and three-dimensional MRCP images were rendered by post  processing. CONTRAST:  7.69m GADAVIST GADOBUTROL 1 MMOL/ML IV SOLN COMPARISON:  05/19/2021 CT abdomen/pelvis and 05/20/2021 abdominal sonogram. FINDINGS: Severely motion degraded scan, limiting assessment. Lower chest: Small dependent right pleural effusion. Hepatobiliary: Relative atrophy of the left liver lobe, particularly the lateral segment left liver. Normal configuration of the right liver and caudate lobe. No hepatic steatosis. Scattered simple subcentimeter liver cysts. Numerous layering subcentimeter gallstones and layering sludge in the distended gallbladder with moderate diffuse gallbladder wall thickening. Small amount of pericholecystic fluid. No significant intrahepatic biliary ductal dilatation. The intrahepatic bile ducts in the atrophic lateral segment left liver lobe are irregular and beaded in contour on the MRCP sequence (series 8/image 121). Top-normal caliber common bile duct with diameter 6 mm. No evidence of a centrally obstructing left liver mass. No discrete biliary filling defects to suggest choledocholithiasis. Pancreas: No pancreatic mass or duct dilation.  No pancreas divisum. Spleen: Normal size. No mass. Adrenals/Urinary Tract: Normal adrenals. No hydronephrosis. Simple bilateral renal cysts, largest 9.7 cm in the posterior upper left kidney. No suspicious renal masses. Stomach/Bowel: Normal non-distended stomach. Visualized small and large bowel is normal caliber, with no bowel wall thickening. Vascular/Lymphatic: Atherosclerotic nonaneurysmal abdominal aorta. Patent portal, splenic, hepatic and renal veins. Retroaortic left renal vein. No pathologically enlarged lymph nodes in the abdomen. Other: No abdominal ascites or focal fluid collection. Musculoskeletal: No aggressive appearing focal osseous lesions. Moderate dextrocurvature of the lumbar spine with associated advanced degenerative disc disease. IMPRESSION: 1. Limited motion degraded scan. 2. Cholelithiasis, layering sludge,  gallbladder distension, moderate diffuse gallbladder wall thickening and small amount of pericholecystic fluid. Findings are compatible with acute cholecystitis. 3. Top-normal caliber common bile duct measuring 6 mm. No evidence of choledocholithiasis. No significant intrahepatic biliary ductal dilatation. 4. Relative atrophy of the lateral segment left liver lobe with associated irregular and beaded contour of the intrahepatic bile ducts in the lateral segment left liver lobe. No evidence of a centrally obstructing left liver mass. 5. Small dependent right pleural effusion. Electronically Signed   By: JIlona SorrelM.D.   On: 05/21/2021 21:29     Medical Consultants:   None.   Subjective:    JEarnest BaileyBlack tolerating his diet no complaints.  Objective:    Vitals:   05/22/21 1607 05/22/21 2005 05/22/21 2323 05/23/21 0316  BP: 115/65 133/77 132/80 107/63  Pulse: 79 80 82 78  Resp: '16 17 16 16  '$ Temp: 97.9 F (36.6 C) 97.6 F (36.4 C) 98.5 F (36.9 C) 97.9 F (36.6 C)  TempSrc: Oral Oral Oral Oral  SpO2: 96% 96% 93% 93%  Weight:      Height:       SpO2: 93 % O2 Flow Rate (L/min): 2 L/min   Intake/Output Summary (Last 24 hours) at 05/23/2021 0824 Last data filed at 05/23/2021 [id number] per 24 hour  Intake 1277 ml  Output 1800 ml  Net -523 ml    Filed Weights   05/02/21 1805 05/17/21 1242  Weight: 77.1 kg 78.4 kg    Exam: General exam: In no acute distress. Respiratory system: Good air movement and clear to auscultation. Cardiovascular system: S1 & S2 heard, RRR. No JVD. Gastrointestinal system: Abdomen is nondistended, soft and nontender.  Extremities: No pedal edema. Skin: No  rashes, lesions or ulcers Psychiatry: Judgement and insight appear normal. Mood & affect appropriate.   Data Reviewed:    Labs: Basic Metabolic Panel: Recent Labs  Lab 05/19/21 0137 05/20/21 0123 05/21/21 0350 05/22/21 0226 05/23/21 0302  NA 130* 127* 129* 132* 132*  K 4.2  3.5 3.6 3.7 3.9  CL 103 98 99 103 102  CO2 21* 21* '23 23 23  '$ GLUCOSE 108* 112* 145* 134* 122*  BUN '18 16 17 15 16  '$ CREATININE 0.79 0.67 0.75 0.74 0.68  CALCIUM 8.1* 8.1* 8.2* 8.2* 8.4*    GFR Estimated Creatinine Clearance: 68 mL/min (by C-G formula based on SCr of 0.68 mg/dL). Liver Function Tests: Recent Labs  Lab 05/19/21 0137 05/20/21 0123 05/21/21 0350 05/22/21 0226 05/23/21 0302  AST 113* 354* 430* 219* 85*  ALT 149* 380* 467* 358* 243*  ALKPHOS 149* 402* 453* 364* 366*  BILITOT 0.7 3.2* 4.0* 1.4* 1.2  PROT 5.8* 5.8* 6.1* 5.8* 6.0*  ALBUMIN 1.6* 1.6* 1.7* 1.6* 1.8*    No results for input(s): LIPASE, AMYLASE in the last 168 hours. No results for input(s): AMMONIA in the last 168 hours. Coagulation profile No results for input(s): INR, PROTIME in the last 168 hours. COVID-19 Labs  No results for input(s): DDIMER, FERRITIN, LDH, CRP in the last 72 hours.  Lab Results  Component Value Date   [place] NEGATIVE 05/02/2021    CBC: Recent Labs  Lab 05/19/21 0137 05/20/21 0123 05/21/21 0350 05/22/21 0226 05/23/21 0302  WBC 20.4* 20.2* 13.6* 14.1* 14.7*  NEUTROABS  --   --  11.0*  --   --   HGB 11.5* 11.9* 11.7* 11.2* 11.4*  HCT 34.3* 34.3* 33.7* 32.7* 33.5*  MCV 92.2 91.0 90.6 90.8 90.5  PLT 379 389 406* 403* 421*    Cardiac Enzymes: No results for input(s): CKTOTAL, CKMB, CKMBINDEX, TROPONINI in the last 168 hours. BNP (last 3 results) No results for input(s): PROBNP in the last 8760 hours. CBG: Recent Labs  Lab 05/18/21 0354 05/18/21 0747 05/18/21 1059 05/18/21 1714 05/22/21 1609  GLUCAP 154* 151* 151* 149* 164*    D-Dimer: No results for input(s): DDIMER in the last 72 hours. Hgb A1c: No results for input(s): HGBA1C in the last 72 hours. Lipid Profile: No results for input(s): CHOL, HDL, LDLCALC, TRIG, CHOLHDL, LDLDIRECT in the last 72 hours. Thyroid function studies: No results for input(s): TSH, T4TOTAL, T3FREE, THYROIDAB in the last  72 hours.  Invalid input(s): FREET3 Anemia work up: No results for input(s): VITAMINB12, FOLATE, FERRITIN, TIBC, IRON, RETICCTPCT in the last 72 hours. Sepsis Labs: Recent Labs  Lab 05/20/21 0123 05/21/21 0350 05/22/21 0226 05/23/21 0302  WBC 20.2* 13.6* 14.1* 14.7*    Microbiology Recent Results (from the past 240 hour(s))  Aerobic/Anaerobic Culture w Gram Stain (surgical/deep wound)     Status: None (Preliminary result)   Collection Time: 05/13/21  4:49 PM   Specimen: Fluid; Abscess  Result Value Ref Range Status   Specimen Description FLUID PELVIS  Final   Special Requests NONE  Final   Gram Stain   Final    NO SQUAMOUS EPITHELIAL CELLS SEEN RARE WBC SEEN ABUNDANT GRAM NEGATIVE RODS FEW GRAM VARIABLE ROD    Culture   Final    MODERATE KLEBSIELLA OXYTOCA ABUNDANT BACTEROIDES FRAGILIS BETA LACTAMASE POSITIVE Sent to [place] for further susceptibility testing. Performed at [facility], [place] [address] [place], [place] [address]    Report Status PENDING  Incomplete   Organism ID,  Bacteria KLEBSIELLA OXYTOCA  Final      Susceptibility   Klebsiella oxytoca - MIC*    AMPICILLIN >=32 RESISTANT Resistant     CEFAZOLIN >=64 RESISTANT Resistant     CEFEPIME 1 SENSITIVE Sensitive     CEFTAZIDIME <=1 SENSITIVE Sensitive     CEFTRIAXONE 16 RESISTANT Resistant     CIPROFLOXACIN <=0.25 SENSITIVE Sensitive     GENTAMICIN <=1 SENSITIVE Sensitive     IMIPENEM <=0.25 SENSITIVE Sensitive     TRIMETH/SULFA <=20 SENSITIVE Sensitive     AMPICILLIN/SULBACTAM >=32 RESISTANT Resistant     PIP/TAZO >=128 RESISTANT Resistant     * MODERATE KLEBSIELLA OXYTOCA     Medications:    acetaminophen  650 mg Oral Q6H   Chlorhexidine Gluconate Cloth  6 each Topical Daily   diltiazem  120 mg Oral Q8H   feeding supplement  237 mL Oral TID BM   pantoprazole (PROTONIX) IV  40 mg Intravenous Q24H   sodium chloride flush  10-40 mL Intracatheter Q12H   sodium chloride flush  5 mL  Intracatheter Q8H   Continuous Infusions:  lactated ringers Stopped (05/18/21 0640)   meropenem (MERREM) IV Stopped (05/23/21 0617)   methocarbamol (ROBAXIN) IV     promethazine (PHENERGAN) injection (IM or IVPB) 70 mL/hr at 05/04/21 0230      LOS: 21 days   [facility]  05/23/2021, 8:24 AM

## 2021-05-23 NOTE — Progress Notes (Signed)
[facility] Infect[facility]  Date of Admission:  05/02/2021     Total days of antibiotics 12         ASSESSMENT:  Mr. Brogden maculopapular rash appears to be mildly improved today. Source of the rash remains unclear. May consider benedryl to help. Ideally will be able to continue with meropenem as I worry about the potential for side effects with the alternatives. Clinically appears to be doing okay. LFTs continue to trend down. Will continue current dose of meropenem. Continue to monitor rash. Drain management per IR. Continue supportive care per primary team.   PLAN:  Continue meropenem. Consider benadryl as needed for itching. Drain management per IR.  Continue supportive care as needed per primary team.   Dr. Linus Salmons is available for ID questions/concerns over the weekend and ID will follow up on Monday.   Principal Problem:   Pelvic abscess in male [facility]) Active Problems:   Atrial fibrillation with rapid ventricular response (HCC)   SBO (small bowel obstruction) (HCC)   Leukocytosis   Hyperlipidemia   Thoracic aortic aneurysm (HCC)   Malnutrition of moderate degree    acetaminophen  650 mg Oral Q6H   Chlorhexidine Gluconate Cloth  6 each Topical Daily   diltiazem  120 mg Oral Q8H   feeding supplement  237 mL Oral TID BM   pantoprazole (PROTONIX) IV  40 mg Intravenous Q24H   sodium chloride flush  10-40 mL Intracatheter Q12H   sodium chloride flush  5 mL Intracatheter Q8H    SUBJECTIVE:  Afebrile overnight. Had increased itching last night following antibiotic however no itching after morning dose. No current pain or fevers.   No Known Allergies   Review of Systems: Review of Systems  Constitutional:  Negative for chills, fever and weight loss.  Respiratory:  Negative for cough, shortness of breath and wheezing.   Cardiovascular:  Negative for chest pain and leg swelling.  Gastrointestinal:  Negative for abdominal pain, constipation, diarrhea,  nausea and vomiting.  Skin:  Negative for rash.     OBJECTIVE: Vitals:   05/22/21 2323 05/23/21 0316 05/23/21 0841 05/23/21 1250  BP: 132/80 107/63 108/62 118/69  Pulse: 82 78 82   Resp: '16 16 13 20  '$ Temp: 98.5 F (36.9 C) 97.9 F (36.6 C) 98.6 F (37 C) 98 F (36.7 C)  TempSrc: Oral Oral Oral Oral  SpO2: 93% 93% 93% 95%  Weight:      Height:       Body mass index is 24.11 kg/m.  Physical Exam Constitutional:      General: He is not in acute distress.    Appearance: He is well-developed.  Cardiovascular:     Rate and Rhythm: Normal rate and regular rhythm.     Heart sounds: Normal heart sounds.  Pulmonary:     Effort: Pulmonary effort is normal.     Breath sounds: Normal breath sounds.  Skin:    General: Skin is warm and dry.     Comments: Maculopapular rash on trunk appears less compared to previous. Possible some increase in rash on proximal lower extremities.   Neurological:     Mental Status: He is alert and oriented to person, place, and time.  Psychiatric:        Behavior: Behavior normal.        Thought Content: Thought content normal.        Judgment: Judgment normal.    Lab Results Lab Results  Component Value Date  WBC 14.7 (H) 05/23/2021   HGB 11.4 (L) 05/23/2021   HCT 33.5 (L) 05/23/2021   MCV 90.5 05/23/2021   PLT 421 (H) 05/23/2021    Lab Results  Component Value Date   CREATININE 0.68 05/23/2021   BUN 16 05/23/2021   NA 132 (L) 05/23/2021   K 3.9 05/23/2021   CL 102 05/23/2021   CO2 23 05/23/2021    Lab Results  Component Value Date   ALT 243 (H) 05/23/2021   AST 85 (H) 05/23/2021   ALKPHOS 366 (H) 05/23/2021   BILITOT 1.2 05/23/2021     Microbiology: Recent Results (from the past 240 hour(s))  Aerobic/Anaerobic Culture w Gram Stain (surgical/deep wound)     Status: None (Preliminary result)   Collection Time: 05/13/21  4:49 PM   Specimen: Fluid; Abscess  Result Value Ref Range Status   Specimen Description FLUID PELVIS   Final   Special Requests NONE  Final   Gram Stain   Final    NO SQUAMOUS EPITHELIAL CELLS SEEN RARE WBC SEEN ABUNDANT GRAM NEGATIVE RODS FEW GRAM VARIABLE ROD    Culture   Final    MODERATE KLEBSIELLA OXYTOCA ABUNDANT BACTEROIDES FRAGILIS BETA LACTAMASE POSITIVE Sent to [place] for further susceptibility testing. Performed at [facility], [place] [address], [place], [place] [address]    Report Status PENDING  Incomplete   Organism ID, Bacteria KLEBSIELLA OXYTOCA  Final      Susceptibility   Klebsiella oxytoca - MIC*    AMPICILLIN >=32 RESISTANT Resistant     CEFAZOLIN >=64 RESISTANT Resistant     CEFEPIME 1 SENSITIVE Sensitive     CEFTAZIDIME <=1 SENSITIVE Sensitive     CEFTRIAXONE 16 RESISTANT Resistant     CIPROFLOXACIN <=0.25 SENSITIVE Sensitive     GENTAMICIN <=1 SENSITIVE Sensitive     IMIPENEM <=0.25 SENSITIVE Sensitive     TRIMETH/SULFA <=20 SENSITIVE Sensitive     AMPICILLIN/SULBACTAM >=32 RESISTANT Resistant     PIP/TAZO >=128 RESISTANT Resistant     * MODERATE KLEBSIELLA OXYTOCA     Terri Piedra, NP [facility]  05/23/2021  1:20 PM

## 2021-05-23 NOTE — Progress Notes (Signed)
Inpatient Rehab Admissions Coordinator Note:   Patient was screened for CIR candidacy by Michel Santee, PT. At this time, pt appears to be a potential candidate for CIR. I will place an order for rehab consult for full assessment, per our protocol.  Please contact me any with questions.Shann Medal, PT, DPT [phone] 05/23/21 3:10 PM

## 2021-05-24 LAB — COMPREHENSIVE METABOLIC PANEL
ALT: 174 U/L — ABNORMAL HIGH (ref 0–44)
AST: 50 U/L — ABNORMAL HIGH (ref 15–41)
Albumin: 1.9 g/dL — ABNORMAL LOW (ref 3.5–5.0)
Alkaline Phosphatase: 319 U/L — ABNORMAL HIGH (ref 38–126)
Anion gap: 6 (ref 5–15)
BUN: 15 mg/dL (ref 8–23)
CO2: 27 mmol/L (ref 22–32)
Calcium: 8.7 mg/dL — ABNORMAL LOW (ref 8.9–10.3)
Chloride: 100 mmol/L (ref 98–111)
Creatinine, Ser: 0.74 mg/dL (ref 0.61–1.24)
GFR, Estimated: >60 mL/min (ref >60)
Glucose, Bld: 112 mg/dL — ABNORMAL HIGH (ref 70–99)
Potassium: 3.9 mmol/L (ref 3.5–5.1)
Sodium: 133 mmol/L — ABNORMAL LOW (ref 135–145)
Total Bilirubin: 0.9 mg/dL (ref 0.3–1.2)
Total Protein: 6.1 g/dL — ABNORMAL LOW (ref 6.5–8.1)

## 2021-05-24 LAB — CBC
HCT: 32.7 % — ABNORMAL LOW (ref 39.0–52.0)
Hemoglobin: 11 g/dL — ABNORMAL LOW (ref 13.0–17.0)
MCH: 31 pg (ref 26.0–34.0)
MCHC: 33.6 g/dL (ref 30.0–36.0)
MCV: 92.1 fL (ref 80.0–100.0)
Platelets: 403 10*3/uL — ABNORMAL HIGH (ref 150–400)
RBC: 3.55 MIL/uL — ABNORMAL LOW (ref 4.22–5.81)
RDW: 13.5 % (ref 11.5–15.5)
WBC: 14.2 10*3/uL — ABNORMAL HIGH (ref 4.0–10.5)
nRBC: 0 % (ref 0.0–0.2)

## 2021-05-24 MED ORDER — ENOXAPARIN SODIUM 40 MG/0.4ML IJ SOSY
40.0000 mg | PREFILLED_SYRINGE | INTRAMUSCULAR | Status: DC
  Administered 2021-05-24 – 2021-05-26 (×3): 40 mg via SUBCUTANEOUS
  Filled 2021-05-24 (×3): qty 0.4

## 2021-05-24 NOTE — Progress Notes (Signed)
15 Days Post-Op  Subjective: CC: Afebrile overnight. Tolerating reg diet without abdominal pain, n/v. BM yesterday per patient. Passing flatus. Voiding. Working with therapies. CIR evaluating.   Objective: Vital signs in last 24 hours: Temp:  [97.8 F (36.6 C)-98.4 F (36.9 C)] 97.8 F (36.6 C) (08/27 0715) Pulse Rate:  [77-100] 77 (08/27 0715) Resp:  [17-20] 19 (08/27 0715) BP: (114-136)/(53-93) 117/77 (08/27 0715) SpO2:  [95 %-100 %] 96 % (08/27 0715) Last BM Date: 05/21/21  Intake/Output from previous day: 08/26 0701 - 08/27 0700 In: 716.2 [P.O.:600; IV Piggyback:106.2] Out: 3185 [Urine:3175; Drains:10] Intake/Output this shift: No intake/output data recorded.  PE: General: pleasant, WD, elderly male who is laying in bed in NAD Heart: regular  Lungs: Respiratory effort normal and nonlabored Abd: Soft, NT, mildly distended, +BS, midline wound with some fibrinous exudate in wound base and dehisced without evisceration. RLQ incision c/d/I. L inguinal hernia partially reducible and NT w/ no skin changes.  Ext:  No LE edema or calf tenderness Psych: A&Ox3   Lab Results:  Recent Labs    05/22/21 0226 05/23/21 0302  WBC 14.1* 14.7*  HGB 11.2* 11.4*  HCT 32.7* 33.5*  PLT 403* 421*   BMET Recent Labs    05/23/21 0302 05/24/21 0440  NA 132* 133*  K 3.9 3.9  CL 102 100  CO2 23 27  GLUCOSE 122* 112*  BUN 16 15  CREATININE 0.68 0.74  CALCIUM 8.4* 8.7*   PT/INR No results for input(s): LABPROT, INR in the last 72 hours. CMP     Component Value Date/Time   NA 133 (L) 05/24/2021 0440   K 3.9 05/24/2021 0440   CL 100 05/24/2021 0440   CO2 27 05/24/2021 0440   GLUCOSE 112 (H) 05/24/2021 0440   BUN 15 05/24/2021 0440   CREATININE 0.74 05/24/2021 0440   CALCIUM 8.7 (L) 05/24/2021 0440   PROT 6.1 (L) 05/24/2021 0440   ALBUMIN 1.9 (L) 05/24/2021 0440   AST 50 (H) 05/24/2021 0440   ALT 174 (H) 05/24/2021 0440   ALKPHOS 319 (H) 05/24/2021 0440   BILITOT 0.9  05/24/2021 0440   GFRNONAA >60 05/24/2021 0440   GFRAA  08/14/2007 0312    >60        The eGFR has been calculated using the MDRD equation. This calculation has not been validated in all clinical   Lipase     Component Value Date/Time   LIPASE 27 05/02/2021 1806    Studies/Results: No results found.  Anti-infectives: Anti-infectives (From admission, onward)    Start     Dose/Rate Route Frequency Ordered Stop   05/16/21 1745  meropenem (MERREM) 1 g in sodium chloride 0.9 % 100 mL IVPB        1 g 200 mL/hr over 30 Minutes Intravenous Every 8 hours 05/16/21 1655     05/13/21 1515  piperacillin-tazobactam (ZOSYN) IVPB 3.375 g  Status:  Discontinued        3.375 g 12.5 mL/hr over 240 Minutes Intravenous Every 8 hours 05/13/21 1421 05/16/21 1640   05/09/21 0915  ceFAZolin (ANCEF) IVPB 2g/100 mL premix        2 g 200 mL/hr over 30 Minutes Intravenous On call to O.R. 05/09/21 0825 05/09/21 1050        Assessment/Plan POD 22, s/p open right inguinal hernia repair with mesh by Dr. Bobbye Morton for incarcerated RIH with persistent ileus vs SBO POD 14, s/p dx lap with mini laparotomy with SBR, Dr. Donne Hazel 05/09/21 -  S/p IR drainage 8/16 for IA fluid collection. Cx's w/ Klebsiella Oxytoca and abx switched to merrem 8/19 per s/s. CT 8/22 w/ decrease in collection size, now 3.4 x 6.6. IR increasing flushes and considering upsizing  - TID WTD for midline wound  - IS/pulm toilet - Mobilize TID in halls. SNF vs CIR   Acute cholecystitis - GI following. MRCP negative for choledocholithiasis. LFT's downtrending - WBC pending. Patient with no pain on exam and tolerating a diet without n/v. LFT's downtrending as noted below. Continue to treat conservatively w/ abx. If patient has worsening wbc, lft's or develops pain would recommend perc chole drainage.    FEN - On reg diet   VTE - SCDs, ok for heparin gtt while oral anticoagulation is on hold  ID - Merrem (ID following)   MP Rash - per TRH  and ID New onset a fib - on cardizem Ascending thoracic aneurysm HLD   LOS: 22 days    Jillyn Ledger , [facility] Surgery 05/24/2021, 9:18 AM Please see Amion for pager number during day hours 7:00am-4:30pm

## 2021-05-24 NOTE — Progress Notes (Signed)
ANTICOAGULATION CONSULT NOTE - Initial Consult  Pharmacy Consult for enoxaparin dosing Indication: VTE prophylaxis  No Known Allergies  Patient Measurements: Height: '5\' 11"'$  (180.3 cm) Weight: 78.4 kg (172 lb 13.5 oz) IBW/kg (Calculated) : 75.3  Vital Signs: Temp: 97.8 F (36.6 C) (08/27 0715) Temp Source: Oral (08/27 0715) BP: 117/77 (08/27 0715) Pulse Rate: 77 (08/27 0715)  Labs: Recent Labs    05/22/21 0226 05/23/21 0302 05/24/21 0440  HGB 11.2* 11.4*  --   HCT 32.7* 33.5*  --   PLT 403* 421*  --   CREATININE 0.74 0.68 0.74    Estimated Creatinine Clearance: 68 mL/min (by C-G formula based on SCr of 0.74 mg/dL).   Medical History: History reviewed. No pertinent past medical history.   Assessment: 87 y.o. male admitted on 05/02/2021 with intraabdominal infection. While admitted, he was found to have new onset atrial fibrillation. Patient was initially started on apixaban but was held for a MRCP done 8/24. Per MD note, surgery to dictate when to re-start apixaban. Last CBC done 8/26, H/H stable. Renal function stable.   Goal of Therapy:  Monitor platelets by anticoagulation protocol: Yes   Plan:  Start enoxaparin (Lovenox) 40 mg SQ daily F/up long term AC plan [place] CBC, renal function, s/sx of bleeding  Pauletta Browns, Pharm.D. PGY-1 Ambulatory Care Resident [id number] 05/24/2021 8:50 AM

## 2021-05-24 NOTE — Progress Notes (Signed)
Inpatient Rehab Admissions:  Inpatient Rehab Consult received.  I met with patient at the bedside for rehabilitation assessment and to discuss goals and expectations of an inpatient rehab admission.  Pt acknowledged understanding of CIR goals and expectations. Pt interested in pursuing CIR. Pt requested I contact his wife, Romie Minus to explain CIR to her as well. Spoke with Romie Minus on the telephone.  She acknowledged understanding of CIR goals and expectations. She is also interested in pt pursuing CIR.  Will continue to follow.  Signed: Gayland Curry, [place], [place] Admissions Coordinator [phone]

## 2021-05-24 NOTE — Progress Notes (Signed)
Pharmacy Antibiotic Note  Gary Rice is a 87 y.o. male on day #9 Meropenem for intra-abdominal abscess.  Cultures grew Klebsiella and Bacteroides. ID following. Drain placed 05/13/21.  Elevated LFTs and possible acute cholecystitis, currently planning medical treatment.  Per ID note, patient has developed a rash with an unclear source. Drug induced rash remains a consideration. Recommend considering benadryl 25 mg PO 30 to 60 minutes prior to infusion. Alternative antibiotics options include ciprofloxacin or TMP-SMX which would be less than ideal given his age.  Renal function stable.  Plan: Continue Meropenem 1gm IV q8h. Follow renal function for any need to adjust. Follow clinical progress, length of therapy.   Height: '5\' 11"'$  (180.3 cm) Weight: 78.4 kg (172 lb 13.5 oz) IBW/kg (Calculated) : 75.3  Temp (24hrs), Avg:98 F (36.7 C), Min:97.8 F (36.6 C), Max:98.4 F (36.9 C)  Recent Labs  Lab 05/19/21 0137 05/20/21 0123 05/21/21 0350 05/22/21 0226 05/23/21 0302 05/24/21 0440  WBC 20.4* 20.2* 13.6* 14.1* 14.7*  --   CREATININE 0.79 0.67 0.75 0.74 0.68 0.74     Estimated Creatinine Clearance: 68 mL/min (by C-G formula based on SCr of 0.74 mg/dL).    No Known Allergies  Antimicrobials this admission: Meropenem 8/19>> Zosyn 8/16 >> 8/19  Microbiology results: 8/16 pelvic abscess: moderate Klebsiella oxytoca and abundant Bacteroides fragilis - sensitive to Cipro, Cefepime, Ceftazidime, Imipenem, Septra.  Resistant to ampicillin, cefazolin, unasyn, zosyn 8/5 COVID and flu: negative  Thank you for allowing pharmacy to be a part of this patient's care.  Pauletta Browns, Pharm.D. PGY-1 Ambulatory Care Resident [id number] 05/24/2021 8:59 AM

## 2021-05-24 NOTE — Progress Notes (Signed)
[facility] PROGRESS NOTE    Progress Note  Gary Rice  [id number] DOB: 23-May-1931 DOA: 05/02/2021 PCP: Josetta Huddle, MD     Brief Narrative:   Gary Rice is an 87 y.o. male past medical history of SVT, hyperlipidemia bilateral ankle hernias came into the hospital not feeling well found in new onset atrial fibrillation and  SBO. Status post surgical intervention on 05/03/2021 with mesh placement.  He had ongoing abdominal pain so CT scan of the abdomen pelvis was done that showed perforation, general surgery proceeded with diagnostic laparoscopy performed on 05/09/2021 and he is status post bowel resection with primary anastomosis. Was briefly on TPN until 05/18/2021. During his hospitalization he did develop intra-abdominal infection with a CT scan of the abdomen and pelvis showing a large pelvic abscess, IR was consulted who placed a drain on 05/13/2021.  Repeated CT scan of the abdomen showed decreasing size and pelvic abscess but no change in smaller collections.   Assessment/Plan:  Incarcerated right inguinal hernia with SBO: He is currently on a regular diet further management per surgery. Continue to work with physical therapy. Out of bed to chair.  Acute cholecystitis: General surgery on board and recommended conservative management for acute cholecystitis. MRCP show acute cholecystitis without choledocholithiasis. LFTs continue to improve any he has no abdominal pain so we will continue empiric antibiotics. Continue IV meropenem. General surgery to dictate length of antibiotic regimen.  Intra-abdominal abscess: Wound culture grew bacteroids and MDR Klebsiella oxytoca. Continue IV meropenem.  Intractable nausea and vomiting: Required NG tube placement temporarily now resolved.  New onset atrial fibrillation with RVR: Cardiology was consulted and he was treated with IV Cardizem and metoprolol transesophageal echo showed preserved EF. Colitis start him on  DVT prophylaxis with Lovenox.  Surgery to dictate when to start anticoagulation (Eliquis). Currently rate controlled on Cardizem 120, hold Eliquis.  Dyspnea: Improved with position and incentive spirometry. Chest x-ray unremarkable. Ambulated Feb 15, 2021 dyspnea on exertion but saturations remain above 90.  Hypovolemic hyponatremia: Resolved with IV fluid hydration.  Hyperlipidemia: Continue statins.  Ascending aortic aneurysm: Continue follow-up with CT surgery as an outpatient.  Leukocytosis: Continues to have persistent leukocytosis likely due to acute cholecystitis  Maculopapular rash:  question medication we will discuss with ID to see if we can change his antibiotics.  DVT prophylaxis: Lovenox Family Communication:none Status is: Inpatient  Remains inpatient appropriate because:Hemodynamically unstable  Dispo: The patient is from: Home              Anticipated d/c is to: Home              Patient currently is not medically stable to d/c.   Difficult to place patient No   Code Status:     Code Status Orders  (From admission, onward)           Start     Ordered   05/03/21 0017  Full code  Continuous        05/03/21 0016           Code Status History     This patient has a current code status but no historical code status.         IV Access:   Peripheral IV   Procedures and diagnostic studies:   No results found.   Medical Consultants:   None.   Subjective:    Gary Rice tolerating his diet having bowel movements  Objective:    Vitals:  05/23/21 2314 05/24/21 0300 05/24/21 0352 05/24/21 0715  BP: (!) 127/93  (!) 117/53 117/77  Pulse: 100   77  Resp: '20 18 20 19  '$ Temp: 98.4 F (36.9 C)  97.9 F (36.6 C) 97.8 F (36.6 C)  TempSrc: Oral  Oral Oral  SpO2: 100%  98% 96%  Weight:      Height:       SpO2: 96 % O2 Flow Rate (L/min): 2 L/min   Intake/Output Summary (Last 24 hours) at 05/24/2021 0832 Last data  filed at 05/24/2021 [id number] Gross per 24 hour  Intake 716.17 ml  Output 3185 ml  Net -2468.83 ml    Filed Weights   05/02/21 1805 05/17/21 1242  Weight: 77.1 kg 78.4 kg    Exam: General exam: In no acute distress. Respiratory system: Good air movement and clear to auscultation. Cardiovascular system: S1 & S2 heard, RRR. No JVD. Gastrointestinal system: Abdomen is nondistended, soft and nontender.  Extremities: No pedal edema. Skin: No rashes, lesions or ulcers   Data Reviewed:    Labs: Basic Metabolic Panel: Recent Labs  Lab 05/20/21 0123 05/21/21 0350 05/22/21 0226 05/23/21 0302 05/24/21 0440  NA 127* 129* 132* 132* 133*  K 3.5 3.6 3.7 3.9 3.9  CL 98 99 103 102 100  CO2 21* '23 23 23 27  '$ GLUCOSE 112* 145* 134* 122* 112*  BUN '16 17 15 16 15  '$ CREATININE 0.67 0.75 0.74 0.68 0.74  CALCIUM 8.1* 8.2* 8.2* 8.4* 8.7*    GFR Estimated Creatinine Clearance: 68 mL/min (by C-G formula based on SCr of 0.74 mg/dL). Liver Function Tests: Recent Labs  Lab 05/20/21 0123 05/21/21 0350 05/22/21 0226 05/23/21 0302 05/24/21 0440  AST 354* 430* 219* 85* 50*  ALT 380* 467* 358* 243* 174*  ALKPHOS 402* 453* 364* 366* 319*  BILITOT 3.2* 4.0* 1.4* 1.2 0.9  PROT 5.8* 6.1* 5.8* 6.0* 6.1*  ALBUMIN 1.6* 1.7* 1.6* 1.8* 1.9*    No results for input(s): LIPASE, AMYLASE in the last 168 hours. No results for input(s): AMMONIA in the last 168 hours. Coagulation profile No results for input(s): INR, PROTIME in the last 168 hours. COVID-19 Labs  No results for input(s): DDIMER, FERRITIN, LDH, CRP in the last 72 hours.  Lab Results  Component Value Date   [place] NEGATIVE 05/02/2021    CBC: Recent Labs  Lab 05/19/21 0137 05/20/21 0123 05/21/21 0350 05/22/21 0226 05/23/21 0302  WBC 20.4* 20.2* 13.6* 14.1* 14.7*  NEUTROABS  --   --  11.0*  --   --   HGB 11.5* 11.9* 11.7* 11.2* 11.4*  HCT 34.3* 34.3* 33.7* 32.7* 33.5*  MCV 92.2 91.0 90.6 90.8 90.5  PLT 379 389 406* 403*  421*    Cardiac Enzymes: No results for input(s): CKTOTAL, CKMB, CKMBINDEX, TROPONINI in the last 168 hours. BNP (last 3 results) No results for input(s): PROBNP in the last 8760 hours. CBG: Recent Labs  Lab 05/18/21 0354 05/18/21 0747 05/18/21 1059 05/18/21 1714 05/22/21 1609  GLUCAP 154* 151* 151* 149* 164*    D-Dimer: No results for input(s): DDIMER in the last 72 hours. Hgb A1c: No results for input(s): HGBA1C in the last 72 hours. Lipid Profile: No results for input(s): CHOL, HDL, LDLCALC, TRIG, CHOLHDL, LDLDIRECT in the last 72 hours. Thyroid function studies: No results for input(s): TSH, T4TOTAL, T3FREE, THYROIDAB in the last 72 hours.  Invalid input(s): FREET3 Anemia work up: No results for input(s): VITAMINB12, FOLATE, FERRITIN, TIBC, IRON, RETICCTPCT in the last  72 hours. Sepsis Labs: Recent Labs  Lab 05/20/21 0123 05/21/21 0350 05/22/21 0226 05/23/21 0302  WBC 20.2* 13.6* 14.1* 14.7*    Microbiology No results found for this or any previous visit (from the past 240 hour(s)).    Medications:    acetaminophen  650 mg Oral Q6H   Chlorhexidine Gluconate Cloth  6 each Topical Daily   diltiazem  120 mg Oral Q8H   feeding supplement  237 mL Oral TID BM   pantoprazole (PROTONIX) IV  40 mg Intravenous Q24H   sodium chloride flush  10-40 mL Intracatheter Q12H   sodium chloride flush  5 mL Intracatheter Q8H   Continuous Infusions:  lactated ringers Stopped (05/18/21 0640)   meropenem (MERREM) IV 1 g (05/24/21 0526)   methocarbamol (ROBAXIN) IV     promethazine (PHENERGAN) injection (IM or IVPB) 70 mL/hr at 05/04/21 0230      LOS: 40 days   Charlynne Cousins  [facility]  05/24/2021, 8:32 AM

## 2021-05-25 LAB — COMPREHENSIVE METABOLIC PANEL
ALT: 129 U/L — ABNORMAL HIGH (ref 0–44)
AST: 36 U/L (ref 15–41)
Albumin: 2 g/dL — ABNORMAL LOW (ref 3.5–5.0)
Alkaline Phosphatase: 274 U/L — ABNORMAL HIGH (ref 38–126)
Anion gap: 7 (ref 5–15)
BUN: 18 mg/dL (ref 8–23)
CO2: 27 mmol/L (ref 22–32)
Calcium: 8.9 mg/dL (ref 8.9–10.3)
Chloride: 101 mmol/L (ref 98–111)
Creatinine, Ser: 0.81 mg/dL (ref 0.61–1.24)
GFR, Estimated: >60 mL/min (ref >60)
Glucose, Bld: 124 mg/dL — ABNORMAL HIGH (ref 70–99)
Potassium: 4.1 mmol/L (ref 3.5–5.1)
Sodium: 135 mmol/L (ref 135–145)
Total Bilirubin: 0.9 mg/dL (ref 0.3–1.2)
Total Protein: 6.6 g/dL (ref 6.5–8.1)

## 2021-05-25 NOTE — Progress Notes (Signed)
16 Days Post-Op  Subjective: CC: Afebrile overnight. Tolerating reg diet without abdominal pain, n/v. BM yesterday per patient. Passing flatus. Voiding. Working with therapies. CIR evaluating.   Objective: Vital signs in last 24 hours: Temp:  [97.6 F (36.4 C)-98 F (36.7 C)] 97.6 F (36.4 C) (08/28 0719) Pulse Rate:  [69-91] 75 (08/28 0719) Resp:  [16-21] 21 (08/28 0800) BP: (107-132)/(54-81) 107/54 (08/28 0719) SpO2:  [96 %-97 %] 96 % (08/28 0719) Last BM Date: 05/21/21  Intake/Output from previous day: 08/27 0701 - 08/28 0700 In: 240 [P.O.:240] Out: 7017 [Urine:1775] Intake/Output this shift: Total I/O In: -  Out: 300 [Urine:300]  PE: General: pleasant, WD, elderly male who is laying in bed in NAD Heart: regular  Lungs: Respiratory effort normal and nonlabored Abd: Soft, NT, mildly distended, +BS, midline wound with some fibrinous exudate in wound base and dehisced without evisceration. RLQ incision c/d/I. L inguinal hernia partially reducible and NT w/ no skin changes.  Ext:  No LE edema or calf tenderness Psych: A&Ox3   Lab Results:  Recent Labs    05/23/21 0302 05/24/21 0945  WBC 14.7* 14.2*  HGB 11.4* 11.0*  HCT 33.5* 32.7*  PLT 421* 403*   BMET Recent Labs    05/23/21 0302 05/24/21 0440  NA 132* 133*  K 3.9 3.9  CL 102 100  CO2 23 27  GLUCOSE 122* 112*  BUN 16 15  CREATININE 0.68 0.74  CALCIUM 8.4* 8.7*   PT/INR No results for input(s): LABPROT, INR in the last 72 hours. CMP     Component Value Date/Time   NA 133 (L) 05/24/2021 0440   K 3.9 05/24/2021 0440   CL 100 05/24/2021 0440   CO2 27 05/24/2021 0440   GLUCOSE 112 (H) 05/24/2021 0440   BUN 15 05/24/2021 0440   CREATININE 0.74 05/24/2021 0440   CALCIUM 8.7 (L) 05/24/2021 0440   PROT 6.1 (L) 05/24/2021 0440   ALBUMIN 1.9 (L) 05/24/2021 0440   AST 50 (H) 05/24/2021 0440   ALT 174 (H) 05/24/2021 0440   ALKPHOS 319 (H) 05/24/2021 0440   BILITOT 0.9 05/24/2021 0440   GFRNONAA  >60 05/24/2021 0440   GFRAA  08/14/2007 0312    >60        The eGFR has been calculated using the MDRD equation. This calculation has not been validated in all clinical   Lipase     Component Value Date/Time   LIPASE 27 05/02/2021 1806    Studies/Results: No results found.  Anti-infectives: Anti-infectives (From admission, onward)    Start     Dose/Rate Route Frequency Ordered Stop   05/16/21 1745  meropenem (MERREM) 1 g in sodium chloride 0.9 % 100 mL IVPB        1 g 200 mL/hr over 30 Minutes Intravenous Every 8 hours 05/16/21 1655     05/13/21 1515  piperacillin-tazobactam (ZOSYN) IVPB 3.375 g  Status:  Discontinued        3.375 g 12.5 mL/hr over 240 Minutes Intravenous Every 8 hours 05/13/21 1421 05/16/21 1640   05/09/21 0915  ceFAZolin (ANCEF) IVPB 2g/100 mL premix        2 g 200 mL/hr over 30 Minutes Intravenous On call to O.R. 05/09/21 0825 05/09/21 1050        Assessment/Plan POD 23, s/p open right inguinal hernia repair with mesh by Dr. Bobbye Morton for incarcerated RIH with persistent ileus vs SBO POD 15, s/p dx lap with mini laparotomy with SBR, Dr. Donne Hazel  05/09/21 - S/p IR drainage 8/16 for IA fluid collection. Cx's w/ Klebsiella Oxytoca and abx switched to merrem 8/19 per s/s. CT 8/22 w/ decrease in collection size, now 3.4 x 6.6. IR increasing flushes and considering upsizing depending on output. No output recorded in last 24 hours. Will follow up on IR recs.  - TID WTD for midline wound  - IS/pulm toilet - Mobilize TID in halls. SNF vs CIR   Acute cholecystitis - MRCP negative for choledocholithiasis. LFT's downtrending. CMP in process this am.  - WBC pending. Patient with no pain on exam and tolerating a diet without n/v. LFT's downtrending as noted below. Continue to treat conservatively w/ abx. If patient has worsening wbc, lft's or develops pain would recommend perc chole drainage.  - Repeat labs in am   FEN - Reg diet   VTE - SCDs, ok for heparin gtt  while oral anticoagulation is on hold, currently on prophylactic lovenox  ID - Merrem (ID following and appreciate their recommendations on abx selection and duration).    MP Rash - per [facility] and ID. They have ordered benadryl  New onset a fib - on cardizem Ascending thoracic aneurysm HLD   LOS: 23 days    Jillyn Ledger , [facility] 05/25/2021, 10:33 AM Please see Amion for pager number during day hours 7:00am-4:30pm

## 2021-05-25 NOTE — Progress Notes (Addendum)
[facility] PROGRESS NOTE    Progress Note  Gary Rice [facility]  [id number] DOB: Jul 01, 1931 DOA: 05/02/2021 PCP: Josetta Huddle, MD     Brief Narrative:   Gary Rice is an 87 y.o. male past medical history of SVT, hyperlipidemia bilateral ankle hernias came into the hospital not feeling well found in new onset atrial fibrillation and  SBO. Status post surgical intervention on 05/03/2021 with mesh placement.  He had ongoing abdominal pain so CT scan of the abdomen pelvis was done that showed perforation, general surgery proceeded with diagnostic laparoscopy performed on 05/09/2021 and he is status post bowel resection with primary anastomosis. Was briefly on TPN until 05/18/2021. During his hospitalization he did develop intra-abdominal infection with a CT scan of the abdomen and pelvis showing a large pelvic abscess, IR was consulted who placed a drain on 05/13/2021.  Repeated CT scan of the abdomen showed decreasing size and pelvic abscess but no change in smaller collections.   Assessment/Plan:  Incarcerated right inguinal hernia with SBO: He is currently on a regular diet, tolerating it well. Further management per surgery. Continue to work with physical therapy. Out of bed to chair.  Acute cholecystitis: General surgery on board and recommended conservative management for acute cholecystitis. MRCP show acute cholecystitis without choledocholithiasis. LFTs are pending this morning. He denies any abdominal pain.  Platelets continue to improve which are acting as an acute phase reactant. Continue IV meropenem. General surgery to dictate length of antibiotic regimen. He is tolerating his diet he is eating close to 100% of his meals albumin is slowly trending up. But he continues to have persistent elevated white blood cell count  Intra-abdominal abscess: Wound culture grew bacteroids and MDR Klebsiella oxytoca. Continue IV meropenem.  Intractable nausea and  vomiting: Required NG tube placement temporarily. Now resolved.  New onset atrial fibrillation with RVR: Cardiology was consulted and he was treated with IV Cardizem and metoprolol transesophageal echo showed preserved EF. Colitis start him on DVT prophylaxis with Lovenox.  Surgery to dictate when to start anticoagulation (Eliquis). Currently rate controlled on Cardizem 120, hold Eliquis.  Dyspnea: Improved with position and incentive spirometry. Chest x-ray unremarkable. Ambulated Feb 15, 2021 dyspnea on exertion but saturations remain above 90.  Hypovolemic hyponatremia: Resolved with IV fluid hydration.  Hyperlipidemia: Continue statins.  Ascending aortic aneurysm: Continue follow-up with CT surgery as an outpatient.  Leukocytosis: Continues to have persistent leukocytosis likely due to acute cholecystitis  Maculopapular rash:  question medication we will discuss with ID to see if we can change his antibiotics.  DVT prophylaxis: Lovenox Family Communication:none Status is: Inpatient  Remains inpatient appropriate because:Hemodynamically unstable  Dispo: The patient is from: Home              Anticipated d/c is to: Home              Patient currently is not medically stable to d/c.   Difficult to place patient No   Code Status:     Code Status Orders  (From admission, onward)           Start     Ordered   05/03/21 0017  Full code  Continuous        05/03/21 0016           Code Status History     This patient has a current code status but no historical code status.         IV Access:   Peripheral IV  Procedures and diagnostic studies:   No results found.   Medical Consultants:   None.   Subjective:    Gary Rice tolerating his diet had a bowel movement.  Objective:    Vitals:   05/24/21 2015 05/24/21 2341 05/25/21 0400 05/25/21 0719  BP: 132/81 132/81 131/81 (!) 107/54  Pulse:  88 89 75  Resp: '16 20 20 19   '$ Temp: 97.7 F (36.5 C) 98 F (36.7 C) 97.6 F (36.4 C) 97.6 F (36.4 C)  TempSrc: Oral Oral Oral Oral  SpO2: 97% 96% 97% 96%  Weight:      Height:       SpO2: 96 % O2 Flow Rate (L/min): 2 L/min   Intake/Output Summary (Last 24 hours) at 05/25/2021 0847 Last data filed at 05/25/2021 0553 Gross per 24 hour  Intake 240 ml  Output 1775 ml  Net -1535 ml    Filed Weights   05/02/21 1805 05/17/21 1242  Weight: 77.1 kg 78.4 kg    Exam: General exam: In no acute distress. Respiratory system: Good air movement and clear to auscultation. Cardiovascular system: S1 & S2 heard, RRR. No JVD. Gastrointestinal system: Abdomen is nondistended, soft and nontender.  Extremities: No pedal edema. Skin: No rashes, lesions or ulcers  Data Reviewed:    Labs: Basic Metabolic Panel: Recent Labs  Lab 05/20/21 0123 05/21/21 0350 05/22/21 0226 05/23/21 0302 05/24/21 0440  NA 127* 129* 132* 132* 133*  K 3.5 3.6 3.7 3.9 3.9  CL 98 99 103 102 100  CO2 21* '23 23 23 27  '$ GLUCOSE 112* 145* 134* 122* 112*  BUN '16 17 15 16 15  '$ CREATININE 0.67 0.75 0.74 0.68 0.74  CALCIUM 8.1* 8.2* 8.2* 8.4* 8.7*    GFR Estimated Creatinine Clearance: 68 mL/min (by C-G formula based on SCr of 0.74 mg/dL). Liver Function Tests: Recent Labs  Lab 05/20/21 0123 05/21/21 0350 05/22/21 0226 05/23/21 0302 05/24/21 0440  AST 354* 430* 219* 85* 50*  ALT 380* 467* 358* 243* 174*  ALKPHOS 402* 453* 364* 366* 319*  BILITOT 3.2* 4.0* 1.4* 1.2 0.9  PROT 5.8* 6.1* 5.8* 6.0* 6.1*  ALBUMIN 1.6* 1.7* 1.6* 1.8* 1.9*    No results for input(s): LIPASE, AMYLASE in the last 168 hours. No results for input(s): AMMONIA in the last 168 hours. Coagulation profile No results for input(s): INR, PROTIME in the last 168 hours. COVID-19 Labs  No results for input(s): DDIMER, FERRITIN, LDH, CRP in the last 72 hours.  Lab Results  Component Value Date   [place] NEGATIVE 05/02/2021    CBC: Recent Labs  Lab  05/20/21 0123 05/21/21 0350 05/22/21 0226 05/23/21 0302 05/24/21 0945  WBC 20.2* 13.6* 14.1* 14.7* 14.2*  NEUTROABS  --  11.0*  --   --   --   HGB 11.9* 11.7* 11.2* 11.4* 11.0*  HCT 34.3* 33.7* 32.7* 33.5* 32.7*  MCV 91.0 90.6 90.8 90.5 92.1  PLT 389 406* 403* 421* 403*    Cardiac Enzymes: No results for input(s): CKTOTAL, CKMB, CKMBINDEX, TROPONINI in the last 168 hours. BNP (last 3 results) No results for input(s): PROBNP in the last 8760 hours. CBG: Recent Labs  Lab 05/18/21 1059 05/18/21 1714 05/22/21 1609  GLUCAP 151* 149* 164*    D-Dimer: No results for input(s): DDIMER in the last 72 hours. Hgb A1c: No results for input(s): HGBA1C in the last 72 hours. Lipid Profile: No results for input(s): CHOL, HDL, LDLCALC, TRIG, CHOLHDL, LDLDIRECT in the last 72 hours. Thyroid  function studies: No results for input(s): TSH, T4TOTAL, T3FREE, THYROIDAB in the last 72 hours.  Invalid input(s): FREET3 Anemia work up: No results for input(s): VITAMINB12, FOLATE, FERRITIN, TIBC, IRON, RETICCTPCT in the last 72 hours. Sepsis Labs: Recent Labs  Lab 05/21/21 0350 05/22/21 0226 05/23/21 0302 05/24/21 0945  WBC 13.6* 14.1* 14.7* 14.2*    Microbiology No results found for this or any previous visit (from the past 240 hour(s)).    Medications:    acetaminophen  650 mg Oral Q6H   Chlorhexidine Gluconate Cloth  6 each Topical Daily   diltiazem  120 mg Oral Q8H   enoxaparin (LOVENOX) injection  40 mg Subcutaneous Q24H   feeding supplement  237 mL Oral TID BM   pantoprazole (PROTONIX) IV  40 mg Intravenous Q24H   sodium chloride flush  10-40 mL Intracatheter Q12H   sodium chloride flush  5 mL Intracatheter Q8H   Continuous Infusions:  lactated ringers Stopped (05/18/21 0640)   meropenem (MERREM) IV 1 g (05/25/21 [id number])   methocarbamol (ROBAXIN) IV     promethazine (PHENERGAN) injection (IM or IVPB) 70 mL/hr at 05/04/21 0230      LOS: 23 days   Charlynne Cousins  [facility]  05/25/2021, 8:47 AM

## 2021-05-25 NOTE — PMR Pre-admission (Signed)
PMR Admission Coordinator Pre-Admission Assessment  Patient: Gary Rice is an 87 y.o., male MRN: [id number] DOB: [birth year] Height: _0  (180.3 cm) Weight: 78.4 kg  Insurance Information HMO:     PPO:      PCP:      IPA:      80/20: yes     OTHER:  PRIMARY: Medicare A & B      Policy#: [id number]      Subscriber: patient CM Name:       Phone#:      Fax#:  Pre-Cert#:       Employer:  Benefits:  Phone #: verified eligibility online via [place] on 05/25/21     Name:  Eff. Date: Part A effective 08/28/97 & Part B effective 09/28/97    Deduct: $1,556      Out of Pocket Max: NA Life Max: NA CIR: 100%      SNF: 100% days 1-20, 80% days 21-100 Outpatient: 80%     Co-Pay: 20% Home Health: 100%      Co-Pay:  DME: 80%     Co-Pay: 20% Providers: pt's choice SECONDARY: [employer]      Policy#: [id number]     Phone#: [phone]  Financial Counselor:       Phone#:   The "Data Collection Information Summary" for patients in Inpatient Rehabilitation Facilities with attached "Privacy Act [place] Records" was provided and verbally reviewed with: Patient  Emergency Contact Information Contact Information     Name Relation Home Work Mobile   [place] Spouse [phone]         Current Medical History  Patient Admitting Diagnosis: new onset A-fib with RVR and Small Bowel Obstruction History of Present Illness: Pt is a 87 year old male with medical hx significant for: SVT, bilateral inguinal hernias, hyperlipidemia, HTN, previous hernia repair. Pt presented to ED on 05/02/21 with concern for possible SBO. Pt was vomiting day prior to ED presentation. PCP sent pt to ED. Pt noted to be tachycardic in ED. CTA chest negative for PE. CT showed R inguinal hernia with SBO. Pt had emergent open right inguinal hernia repair with mesh on 05/03/21. Pt developed a post-op ileus. Had NG tube placed. Repeat CT showed ongoing SBO in LLQ. Diagnostic laparoscopy and small bowel resection with primary  anastomosis performed on 05/09/21. Pt found to have ischemic portion of small bowel with microperforation. Pt began TPN. Pt developed intra-abdominal infection.  CT  abdomen/pelvis on 8/16 noted large pelvic abscess. Pelvic abscess drain placed on 8/16 by IR. NG tube removed 8/18. TPN ended on 8/21. Repeat CT abdomen/pelvis on 8/22 showed gallbladder distention with multiple gallstones with gallbladder wall thickening, edema and pericholecystic standing. Also noted bile duct dilatation. Right upper quadrant ultrasound on 8/23 confirmed acute cholecystitis. Decision made to treat medically with antibiotics.  Therapy evaluations completed and note pt has a decline in functional mobility and ability to complete ADLS independently.    Patient's medical record from [facility] has been reviewed by the rehabilitation admission coordinator and physician.  Past Medical History  Past Medical History:  Diagnosis Date   Astigmatism    Basal cell carcinoma    Inguinal hernia    bilateral   Macular degeneration, age related, nonexudative    bilateral   SVT (supraventricular tachycardia) ([place])     Has the patient had major surgery during 100 days prior to admission? Yes  Family History   family history includes Heart attack in his father and  son; Heart disease in his father; Sleep apnea in his son.  Current Medications  Current Facility-Administered Medications:    acetaminophen (TYLENOL) tablet 650 mg, 650 mg, Oral, Q6H, Mariel Aloe, MD, 650 mg at 05/24/21 2223   Chlorhexidine Gluconate Cloth 2 % PADS 6 each, 6 each, Topical, Daily, Little Ishikawa, MD, 6 each at 05/26/21 [phone]   diltiazem (CARDIZEM) tablet 120 mg, 120 mg, Oral, Q8H, Mariel Aloe, MD, 120 mg at 05/26/21 1203   enoxaparin (LOVENOX) injection 40 mg, 40 mg, Subcutaneous, Q24H, Pauletta Browns, RPH, 40 mg at 05/26/21 0840   feeding supplement (ENSURE ENLIVE / ENSURE PLUS) liquid 237 mL, 237 mL, Oral, TID BM, Mariel Aloe, MD, 237 mL at 05/25/21 2115   lactated ringers infusion, , Intravenous, Continuous, Bertis Ruddy, RPH, Stopped at 05/18/21 1062   lip balm (CARMEX) ointment, , Topical, PRN, Little Ishikawa, MD, Given at 05/09/21 2249   meropenem (MERREM) 1 g in sodium chloride 0.9 % 100 mL IVPB, 1 g, Intravenous, Q8H, Manandhar, Collene Mares, MD, Last Rate: 200 mL/hr at 05/26/21 1207, 1 g at 05/26/21 1207   methocarbamol (ROBAXIN) 1,000 mg in dextrose 5 % 100 mL IVPB, 1,000 mg, Intravenous, Q8H PRN, Kabrich, Martha H, PA-C   metoprolol tartrate (LOPRESSOR) injection 5 mg, 5 mg, Intravenous, Q4H PRN, Winferd Humphrey, PA-C, 5 mg at 05/03/21 1334   morphine 2 MG/ML injection 1-2 mg, 1-2 mg, Intravenous, Q2H PRN, Winferd Humphrey, PA-C, 2 mg at 05/12/21 0204   ondansetron [facility]) injection 4 mg, 4 mg, Intravenous, Q6H PRN, Winferd Humphrey, PA-C, 4 mg at 05/08/21 2119   pantoprazole (PROTONIX) injection 40 mg, 40 mg, Intravenous, Q24H, Winferd Humphrey, PA-C, 40 mg at 05/26/21 0837   phenol (CHLORASEPTIC) mouth spray 1 spray, 1 spray, Mouth/Throat, PRN, Winferd Humphrey, PA-C, 1 spray at 05/03/21 1016   promethazine (PHENERGAN) 6.25 mg in sodium chloride 0.9 % 50 mL IVPB, 6.25 mg, Intravenous, Q6H PRN, Winferd Humphrey, PA-C, Last Rate: 70 mL/hr at 05/04/21 0230, Infusion Verify at 05/04/21 0230   sodium chloride flush (NS) 0.9 % injection 10-40 mL, 10-40 mL, Intracatheter, Q12H, Little Ishikawa, MD, 10 mL at 05/26/21 0841   sodium chloride flush (NS) 0.9 % injection 10-40 mL, 10-40 mL, Intracatheter, PRN, Little Ishikawa, MD, 10 mL at 05/21/21 2132   sodium chloride flush (NS) 0.9 % injection 5 mL, 5 mL, Intracatheter, Q8H, Mir, Paula Libra, MD, 5 mL at 05/26/21 6948  Patients Current Diet:  Diet Order             Diet - low sodium heart healthy           Diet - low sodium heart healthy           Diet regular Room service appropriate? Yes with Assist; Fluid consistency: Thin  Diet  effective now                   Precautions / Restrictions Precautions Precautions: Fall Precaution Comments: JP drain R abdomen Restrictions Weight Bearing Restrictions: No   Has the patient had 2 or more falls or a fall with injury in the past year? No  Prior Activity Level Community (5-7x/wk): drives, out of house 3-4x/week  Prior Functional Level Self Care: Did the patient need help bathing, dressing, using the toilet or eating? Independent  Indoor Mobility: Did the patient need assistance with walking from room to room (with or without device)? Independent  Stairs: Did the patient need assistance with internal or external stairs (with or without device)? Independent  Functional Cognition: Did the patient need help planning regular tasks such as shopping or remembering to take medications? Independent  Patient Information Are you of Hispanic, Latino/a,or Spanish origin?: A. No, not of Hispanic, Latino/a, or Spanish origin What is your race?: A. White Do you need or want an interpreter to communicate with a doctor or health care staff?: 0. No  Patient's Response To:  Health Literacy and Transportation Is the patient able to respond to health literacy and transportation needs?: Yes Health Literacy - How often do you need to have someone help you when you read instructions, pamphlets, or other written material from your doctor or pharmacy?: Never In the past 12 months, has lack of transportation kept you from medical appointments or from getting medications?: No In the past 12 months, has lack of transportation kept you from meetings, work, or from getting things needed for daily living?: No  Development worker, international aid / [place] Devices/Equipment: None Home Equipment: Grab bars - tub/shower, Radio producer - single point  Prior Device Use: Indicate devices/aids used by the patient prior to current illness, exacerbation or injury?  cane  Current Functional  Level Cognition  Overall Cognitive Status: Within Functional Limits for tasks assessed Orientation Level: Oriented X4 General Comments: Pt is self directed, wants to do things his way, however motivated and paticipatory    Extremity Assessment (includes Sensation/Coordination)  Upper Extremity Assessment: Generalized weakness, Overall WFL for tasks assessed  Lower Extremity Assessment: Defer to PT evaluation    ADLs  Overall ADL's : Needs assistance/impaired Eating/Feeding: Independent, Bed level, Sitting Grooming: Wash/dry hands, Wash/dry face, Min guard, Standing Grooming Details (indicate cue type and reason): completed at sink Upper Body Bathing: Set up, Sitting Lower Body Bathing: Minimal assistance, Sitting/lateral leans, Sit to/from stand Lower Body Bathing Details (indicate cue type and reason): Min A for throroughness due to safety and pt fatigue after ambulating in hallway Upper Body Dressing : Modified independent, Set up, Sitting (Sitting up at EOB (required RN assist to unconnect and reconnect IV while changing hospital gown).) Lower Body Dressing: Min guard, Sit to/from stand, Sitting/lateral leans Toilet Transfer: Min guard, Ambulation Toilet Transfer Details (indicate cue type and reason): Pt in hallway, ambulated back to room and wanted to use BSC this session Toileting- Clothing Manipulation and Hygiene: Minimal assistance, Sitting/lateral lean, Sit to/from stand Toileting - Clothing Manipulation Details (indicate cue type and reason): Min A for thoroughness Tub/ Shower Transfer: Min guard, Ambulation Functional mobility during ADLs: Min guard, Rolling walker General ADL Comments: Pt was agreeable to bilateral UE strengthening HEP using level 3 theraband sitting up in bed. Pt reports that he sat up for "too long" yesterday and is waiting for lunch to arrive.    Mobility  Overal bed mobility: Needs Assistance Bed Mobility: Sit to Supine Rolling: Supervision Sidelying  to sit: Supervision, HOB elevated Supine to sit: Min guard Sit to supine: Supervision General bed mobility comments: pt able to take self from sitting edge of bed to supine in bed with no rails, HOB flat. increased time/effort needed.    Transfers  Overall transfer level: Needs assistance Equipment used: Rolling walker (2 wheeled) Transfers: Sit to/from Stand Sit to Stand: Supervision, Min guard Stand pivot transfers: Min guard General transfer comment: supervision from chair with pt demo'ing good technique. min guard assist from low toilet with grab bar used and increased time/effort needed for full standing.  Ambulation / Gait / Stairs / Wheelchair Mobility  Ambulation/Gait Ambulation/Gait assistance: Counsellor (Feet): 120 Feet (with 2 brief standing rest breaks, then 10 feet from bathroom to bed) Assistive device: Rolling walker (2 wheeled) Gait Pattern/deviations: Step-through pattern, Decreased step length - right, Decreased step length - left, Decreased stride length, Trunk flexed General Gait Details: decreased gait speed with no balance issues noted. cues for posture at times with gait. VSS with gait. Gait velocity: decreased Gait velocity interpretation: <1.31 ft/sec, indicative of household ambulator Stairs:  (pt defers to attempt due to fatigue) Stairs assistance: Min guard Stair Management: Backwards, Forwards, With walker Number of Stairs: 1 (x6) General stair comments: able to step up x 6 to RW over single step leading with RLE,    Posture / Balance Balance Overall balance assessment: Needs assistance Sitting-balance support: Feet supported Sitting balance-Leahy Scale: Normal Standing balance support: Bilateral upper extremity supported, During functional activity Standing balance-Leahy Scale: Poor Standing balance comment: Reliant on B UE support for steadying, Supervision for static standing and up to min guard for dynamic (when pt performs his own  peri-care unsupported, etc); pt able to don face mask with min guard; Attempted to encourage gait trial with cane as pt used this previously but pt not agreeable to attempt.    Special needs/care consideration Continuous Drip IV  lactated ringers infusion, Skin Catheter entry/exit: flank/right, lower; Ecchymosis: abdomen/right, left, lower; Rash: abdomen/bilateral, circumferential; Surgical incision: abdomen; Closed system drain: midline, and Designated visitor Kaseem Vastine, wife   Previous Home Environment (from acute therapy documentation) Living Arrangements: Spouse/significant other  Lives With: Spouse Available Help at Discharge: Family, Available 24 hours/day Type of Home: House Home Layout: Two level, Other (Comment) (There is a bedroom and a bathroom on 1st floor) Alternate Level Stairs-Rails: Can reach both Alternate Level Stairs-Number of Steps: 17 steps Home Access: Stairs to enter Entrance Stairs-Rails: Right Entrance Stairs-Number of Steps: 11 steps in garage Bathroom Shower/Tub: Multimedia programmer: Standard Bathroom Accessibility: Yes How Accessible: Accessible via walker [place]: No Additional Comments: one fall recently- fell making a turn into the bathroom and hit hard on R hip but no injuries besides bruising. only uses [employer] PRN  Discharge Living Setting Plans for Discharge Living Setting: Patient's home Type of Home at Discharge: House Discharge Home Layout: Two level, Other (Comment) (There is a bedroom and a bathroom on 1st floor) Alternate Level Stairs-Rails: Can reach both Alternate Level Stairs-Number of Steps: 17 Discharge Home Access: Stairs to enter Entrance Stairs-Rails: Right Entrance Stairs-Number of Steps: 11 steps in garage Discharge Bathroom Shower/Tub: Walk-in shower Discharge Bathroom Toilet: Standard Discharge Bathroom Accessibility: Yes How Accessible: Accessible via walker Does the patient have any problems obtaining your  medications?: No  Social/Family/Support Systems Patient Roles: Spouse Anticipated Caregiver: Arney Mayabb, wife Anticipated Caregiver's Contact Information: [phone] Ability/Limitations of Caregiver: can provide supervision Caregiver Availability: 24/7 Discharge Plan Discussed with Primary Caregiver: Yes Is Caregiver In Agreement with Plan?: Yes Does Caregiver/Family have Issues with Lodging/Transportation while Pt is in Rehab?: Yes (wife does not drive)  Goals Patient/Family Goal for Rehab: Mod I: PT/OT Expected length of stay: 7-10 days Pt/Family Agrees to Admission and willing to participate: Yes Program Orientation Provided & Reviewed with Pt/Caregiver Including Roles  & Responsibilities: Yes  Decrease burden of Care through IP rehab admission: NA  Possible need for SNF placement upon discharge: Not anticipated  Patient Condition: I have reviewed medical records from [facility], spoken with CM, and  patient and spouse. I met with patient at the bedside and discussed via phone for inpatient rehabilitation assessment.  Patient will benefit from ongoing PT and OT, can actively participate in 3 hours of therapy a day 5 days of the week, and can make measurable gains during the admission.  Patient will also benefit from the coordinated team approach during an Inpatient Acute Rehabilitation admission.  The patient will receive intensive therapy as well as Rehabilitation physician, nursing, social worker, and care management interventions.  Due to safety, skin/wound care, disease management, medication administration, pain management, and patient education the patient requires 24 hour a day rehabilitation nursing.  The patient is currently Supervision-Min G with mobility and Min G-Min A with basic ADLs.  Discharge setting and therapy post discharge at home with home health is anticipated.  Patient has agreed to participate in the Acute Inpatient Rehabilitation Program and will admit  today.  Preadmission Screen Completed By:  Bethel Born, 05/26/2021 12:19 PM ______________________________________________________________________   Discussed status with Dr. Ranell Patrick on 05/26/21  at 12:19 PM and received approval for admission today.  Admission Coordinator:  Bethel Born, CCC-SLP, time 12:19 PM/Date 05/26/21    Assessment/Plan: Diagnosis: Pelvic abscess Does the need for close, 24 hr/day Medical supervision in concert with the patient's rehab needs make it unreasonable for this patient to be served in a less intensive setting? Yes Co-Morbidities requiring supervision/potential complications: Afib with RVR, SBO, leukocytosis, HLD, thoracic aortic aneurysm Due to bladder management, bowel management, safety, skin/wound care, disease management, medication administration, pain management, and patient education, does the patient require 24 hr/day rehab nursing? Yes Does the patient require coordinated care of a physician, rehab nurse, PT, OT to address physical and functional deficits in the context of the above medical diagnosis(es)? Yes Addressing deficits in the following areas: balance, endurance, locomotion, strength, transferring, bowel/bladder control, bathing, dressing, feeding, grooming, toileting, and psychosocial support Can the patient actively participate in an intensive therapy program of at least 3 hrs of therapy 5 days a week? Yes The potential for patient to make measurable gains while on inpatient rehab is excellent Anticipated functional outcomes upon discharge from inpatient rehab: modified independent PT, modified independent OT, independent SLP Estimated rehab length of stay to reach the above functional goals is: 5-7 days Anticipated discharge destination: Home 10. Overall Rehab/Functional Prognosis: excellent   MD Signature: Leeroy Cha, MD

## 2021-05-26 ENCOUNTER — Other Ambulatory Visit: Payer: Self-pay

## 2021-05-26 ENCOUNTER — Encounter (HOSPITAL_COMMUNITY): Payer: Self-pay | Admitting: Internal Medicine

## 2021-05-26 ENCOUNTER — Inpatient Hospital Stay (HOSPITAL_COMMUNITY)
Admission: RE | Admit: 2021-05-26 | Discharge: 2021-06-04 | DRG: 945 | Disposition: A | Discharge status: Home Health | Payer: Medicare Other | Source: Intra-hospital | Attending: Physical Medicine and Rehabilitation | Admitting: Physical Medicine and Rehabilitation

## 2021-05-26 ENCOUNTER — Encounter (HOSPITAL_COMMUNITY): Payer: Self-pay | Admitting: Physical Medicine & Rehabilitation

## 2021-05-26 DIAGNOSIS — K6811 Postprocedural retroperitoneal abscess: Secondary | ICD-10-CM | POA: Diagnosis not present

## 2021-05-26 DIAGNOSIS — Z96641 Presence of right artificial hip joint: Secondary | ICD-10-CM | POA: Diagnosis present

## 2021-05-26 DIAGNOSIS — Y838 Other surgical procedures as the cause of abnormal reaction of the patient, or of later complication, without mention of misadventure at the time of the procedure: Secondary | ICD-10-CM | POA: Diagnosis present

## 2021-05-26 DIAGNOSIS — R490 Dysphonia: Secondary | ICD-10-CM | POA: Diagnosis present

## 2021-05-26 DIAGNOSIS — H9190 Unspecified hearing loss, unspecified ear: Secondary | ICD-10-CM | POA: Diagnosis present

## 2021-05-26 DIAGNOSIS — R031 Nonspecific low blood-pressure reading: Secondary | ICD-10-CM | POA: Diagnosis not present

## 2021-05-26 DIAGNOSIS — K4090 Unilateral inguinal hernia, without obstruction or gangrene, not specified as recurrent: Secondary | ICD-10-CM | POA: Diagnosis present

## 2021-05-26 DIAGNOSIS — K5900 Constipation, unspecified: Secondary | ICD-10-CM | POA: Diagnosis present

## 2021-05-26 DIAGNOSIS — K8000 Calculus of gallbladder with acute cholecystitis without obstruction: Secondary | ICD-10-CM | POA: Diagnosis present

## 2021-05-26 DIAGNOSIS — Z85828 Personal history of other malignant neoplasm of skin: Secondary | ICD-10-CM | POA: Diagnosis not present

## 2021-05-26 DIAGNOSIS — T8143XD Infection following a procedure, organ and space surgical site, subsequent encounter: Secondary | ICD-10-CM

## 2021-05-26 DIAGNOSIS — Z8679 Personal history of other diseases of the circulatory system: Secondary | ICD-10-CM | POA: Diagnosis not present

## 2021-05-26 DIAGNOSIS — K56609 Unspecified intestinal obstruction, unspecified as to partial versus complete obstruction: Secondary | ICD-10-CM | POA: Diagnosis present

## 2021-05-26 DIAGNOSIS — E46 Unspecified protein-calorie malnutrition: Secondary | ICD-10-CM | POA: Diagnosis present

## 2021-05-26 DIAGNOSIS — Z7982 Long term (current) use of aspirin: Secondary | ICD-10-CM

## 2021-05-26 DIAGNOSIS — D62 Acute posthemorrhagic anemia: Secondary | ICD-10-CM | POA: Diagnosis present

## 2021-05-26 DIAGNOSIS — B961 Klebsiella pneumoniae [K. pneumoniae] as the cause of diseases classified elsewhere: Secondary | ICD-10-CM | POA: Diagnosis present

## 2021-05-26 DIAGNOSIS — Z9842 Cataract extraction status, left eye: Secondary | ICD-10-CM

## 2021-05-26 DIAGNOSIS — E871 Hypo-osmolality and hyponatremia: Secondary | ICD-10-CM | POA: Diagnosis present

## 2021-05-26 DIAGNOSIS — K810 Acute cholecystitis: Secondary | ICD-10-CM

## 2021-05-26 DIAGNOSIS — Z7901 Long term (current) use of anticoagulants: Secondary | ICD-10-CM

## 2021-05-26 DIAGNOSIS — L270 Generalized skin eruption due to drugs and medicaments taken internally: Secondary | ICD-10-CM | POA: Diagnosis present

## 2021-05-26 DIAGNOSIS — K651 Peritoneal abscess: Secondary | ICD-10-CM | POA: Diagnosis present

## 2021-05-26 DIAGNOSIS — Z8249 Family history of ischemic heart disease and other diseases of the circulatory system: Secondary | ICD-10-CM

## 2021-05-26 DIAGNOSIS — I4891 Unspecified atrial fibrillation: Secondary | ICD-10-CM | POA: Diagnosis present

## 2021-05-26 DIAGNOSIS — Z978 Presence of other specified devices: Secondary | ICD-10-CM | POA: Diagnosis not present

## 2021-05-26 DIAGNOSIS — K8010 Calculus of gallbladder with chronic cholecystitis without obstruction: Secondary | ICD-10-CM | POA: Diagnosis present

## 2021-05-26 DIAGNOSIS — R5381 Other malaise: Secondary | ICD-10-CM | POA: Diagnosis present

## 2021-05-26 DIAGNOSIS — I712 Thoracic aortic aneurysm, without rupture: Secondary | ICD-10-CM | POA: Diagnosis present

## 2021-05-26 DIAGNOSIS — Z9841 Cataract extraction status, right eye: Secondary | ICD-10-CM | POA: Diagnosis not present

## 2021-05-26 DIAGNOSIS — Z79899 Other long term (current) drug therapy: Secondary | ICD-10-CM

## 2021-05-26 DIAGNOSIS — D72829 Elevated white blood cell count, unspecified: Secondary | ICD-10-CM | POA: Diagnosis present

## 2021-05-26 DIAGNOSIS — R7303 Prediabetes: Secondary | ICD-10-CM | POA: Diagnosis present

## 2021-05-26 DIAGNOSIS — R109 Unspecified abdominal pain: Secondary | ICD-10-CM | POA: Diagnosis not present

## 2021-05-26 DIAGNOSIS — Z5329 Procedure and treatment not carried out because of patient's decision for other reasons: Secondary | ICD-10-CM | POA: Diagnosis not present

## 2021-05-26 DIAGNOSIS — I4892 Unspecified atrial flutter: Secondary | ICD-10-CM | POA: Diagnosis present

## 2021-05-26 DIAGNOSIS — R7989 Other specified abnormal findings of blood chemistry: Secondary | ICD-10-CM | POA: Diagnosis present

## 2021-05-26 DIAGNOSIS — Z87891 Personal history of nicotine dependence: Secondary | ICD-10-CM | POA: Diagnosis not present

## 2021-05-26 DIAGNOSIS — Z9181 History of falling: Secondary | ICD-10-CM

## 2021-05-26 DIAGNOSIS — Z95828 Presence of other vascular implants and grafts: Secondary | ICD-10-CM

## 2021-05-26 DIAGNOSIS — B966 Bacteroides fragilis [B. fragilis] as the cause of diseases classified elsewhere: Secondary | ICD-10-CM | POA: Diagnosis present

## 2021-05-26 LAB — COMPREHENSIVE METABOLIC PANEL
ALT: 102 U/L — ABNORMAL HIGH (ref 0–44)
AST: 29 U/L (ref 15–41)
Albumin: 2.2 g/dL — ABNORMAL LOW (ref 3.5–5.0)
Alkaline Phosphatase: 237 U/L — ABNORMAL HIGH (ref 38–126)
Anion gap: 7 (ref 5–15)
BUN: 17 mg/dL (ref 8–23)
CO2: 28 mmol/L (ref 22–32)
Calcium: 9.4 mg/dL (ref 8.9–10.3)
Chloride: 99 mmol/L (ref 98–111)
Creatinine, Ser: 0.85 mg/dL (ref 0.61–1.24)
GFR, Estimated: >60 mL/min (ref >60)
Glucose, Bld: 113 mg/dL — ABNORMAL HIGH (ref 70–99)
Potassium: 4.3 mmol/L (ref 3.5–5.1)
Sodium: 134 mmol/L — ABNORMAL LOW (ref 135–145)
Total Bilirubin: 0.8 mg/dL (ref 0.3–1.2)
Total Protein: 6.8 g/dL (ref 6.5–8.1)

## 2021-05-26 LAB — CBC
HCT: 36.2 % — ABNORMAL LOW (ref 39.0–52.0)
Hemoglobin: 12.2 g/dL — ABNORMAL LOW (ref 13.0–17.0)
MCH: 31.2 pg (ref 26.0–34.0)
MCHC: 33.7 g/dL (ref 30.0–36.0)
MCV: 92.6 fL (ref 80.0–100.0)
Platelets: 404 10*3/uL — ABNORMAL HIGH (ref 150–400)
RBC: 3.91 MIL/uL — ABNORMAL LOW (ref 4.22–5.81)
RDW: 14 % (ref 11.5–15.5)
WBC: 14.3 10*3/uL — ABNORMAL HIGH (ref 4.0–10.5)
nRBC: 0 % (ref 0.0–0.2)

## 2021-05-26 MED ORDER — ACETAMINOPHEN 325 MG PO TABS
325.0000 mg | ORAL_TABLET | Freq: Three times a day (TID) | ORAL | Status: DC
  Administered 2021-05-28 – 2021-06-02 (×3): 325 mg via ORAL
  Filled 2021-05-26 (×12): qty 1

## 2021-05-26 MED ORDER — SODIUM CHLORIDE 0.9% FLUSH
10.0000 mL | INTRAVENOUS | Status: DC | PRN
  Administered 2021-05-28: 10 mL

## 2021-05-26 MED ORDER — TRAZODONE HCL 50 MG PO TABS: 25.0000 mg | ORAL_TABLET | Freq: Every evening | ORAL | Status: DC | PRN

## 2021-05-26 MED ORDER — ALUM & MAG HYDROXIDE-SIMETH 200-200-20 MG/5ML PO SUSP: 30.0000 mL | ORAL | Status: DC | PRN

## 2021-05-26 MED ORDER — FLEET ENEMA 7-19 GM/118ML RE ENEM: 1.0000 | ENEMA | Freq: Once | RECTAL | Status: DC | PRN

## 2021-05-26 MED ORDER — PANTOPRAZOLE SODIUM 40 MG IV SOLR
40.0000 mg | Freq: Every day | INTRAVENOUS | Status: DC
  Administered 2021-05-27 – 2021-05-28 (×2): 40 mg via INTRAVENOUS
  Filled 2021-05-26 (×2): qty 40

## 2021-05-26 MED ORDER — DILTIAZEM HCL 60 MG PO TABS
120.0000 mg | ORAL_TABLET | Freq: Three times a day (TID) | ORAL | Status: DC
  Administered 2021-05-26 – 2021-06-01 (×15): 120 mg via ORAL
  Filled 2021-05-26 (×21): qty 2

## 2021-05-26 MED ORDER — PROCHLORPERAZINE MALEATE 5 MG PO TABS: 5.0000 mg | ORAL_TABLET | Freq: Four times a day (QID) | ORAL | Status: DC | PRN

## 2021-05-26 MED ORDER — OCUVITE-LUTEIN PO CAPS
1.0000 | ORAL_CAPSULE | Freq: Two times a day (BID) | ORAL | Status: DC
  Filled 2021-05-26: qty 1

## 2021-05-26 MED ORDER — ENSURE MAX PROTEIN PO LIQD
11.0000 [oz_av] | Freq: Three times a day (TID) | ORAL | Status: DC
  Administered 2021-05-31 – 2021-06-04 (×9): 11 [oz_av] via ORAL

## 2021-05-26 MED ORDER — BISACODYL 10 MG RE SUPP: 10.0000 mg | Freq: Every day | RECTAL | Status: DC | PRN

## 2021-05-26 MED ORDER — APIXABAN 5 MG PO TABS
5.0000 mg | ORAL_TABLET | Freq: Two times a day (BID) | ORAL | Status: DC
  Administered 2021-05-26 – 2021-06-04 (×18): 5 mg via ORAL
  Filled 2021-05-26 (×18): qty 1

## 2021-05-26 MED ORDER — GUAIFENESIN-DM 100-10 MG/5ML PO SYRP: 5.0000 mL | ORAL_SOLUTION | Freq: Four times a day (QID) | ORAL | Status: DC | PRN

## 2021-05-26 MED ORDER — JUVEN PO PACK
1.0000 | PACK | Freq: Two times a day (BID) | ORAL | Status: DC
  Administered 2021-05-27 – 2021-06-02 (×2): 1 via ORAL
  Filled 2021-05-26 (×5): qty 1

## 2021-05-26 MED ORDER — APIXABAN 5 MG PO TABS: ORAL_TABLET | ORAL | 0 refills | Status: DC

## 2021-05-26 MED ORDER — PROCHLORPERAZINE 25 MG RE SUPP: 12.5000 mg | Freq: Four times a day (QID) | RECTAL | Status: DC | PRN

## 2021-05-26 MED ORDER — TAB-A-VITE/IRON PO TABS
1.0000 | ORAL_TABLET | Freq: Every day | ORAL | Status: DC
  Filled 2021-05-26: qty 1

## 2021-05-26 MED ORDER — PROCHLORPERAZINE EDISYLATE 10 MG/2ML IJ SOLN: 5.0000 mg | Freq: Four times a day (QID) | INTRAMUSCULAR | Status: DC | PRN

## 2021-05-26 MED ORDER — MEROPENEM IV (FOR PTA / DISCHARGE USE ONLY): 1.0000 g | Freq: Three times a day (TID) | INTRAVENOUS | 0 refills | Status: DC

## 2021-05-26 MED ORDER — ACETAMINOPHEN 325 MG PO TABS: 325.0000 mg | ORAL_TABLET | ORAL | Status: DC | PRN

## 2021-05-26 MED ORDER — SODIUM CHLORIDE 0.9 % IV SOLN
1.0000 g | Freq: Three times a day (TID) | INTRAVENOUS | Status: AC
  Administered 2021-05-26 – 2021-06-02 (×22): 1 g via INTRAVENOUS
  Filled 2021-05-26 (×23): qty 1

## 2021-05-26 MED ORDER — AMOXICILLIN-POT CLAVULANATE 875-125 MG PO TABS: 1.0000 | ORAL_TABLET | Freq: Two times a day (BID) | ORAL | Status: DC

## 2021-05-26 MED ORDER — SODIUM CHLORIDE 0.9% FLUSH
10.0000 mL | Freq: Two times a day (BID) | INTRAVENOUS | Status: DC
  Administered 2021-05-26 – 2021-06-03 (×14): 10 mL

## 2021-05-26 MED ORDER — SODIUM CHLORIDE 0.9 % IV SOLN
1.0000 g | Freq: Three times a day (TID) | INTRAVENOUS | Status: DC
  Administered 2021-05-26: 1 g via INTRAVENOUS
  Filled 2021-05-26 (×3): qty 1

## 2021-05-26 MED ORDER — DIPHENHYDRAMINE HCL 12.5 MG/5ML PO ELIX
12.5000 mg | ORAL_SOLUTION | Freq: Four times a day (QID) | ORAL | Status: DC | PRN
  Filled 2021-05-26: qty 10

## 2021-05-26 MED ORDER — POLYETHYLENE GLYCOL 3350 17 G PO PACK
17.0000 g | PACK | Freq: Every day | ORAL | Status: DC | PRN
Start: 2021-05-26 — End: 2021-06-04

## 2021-05-26 MED ORDER — AMOXICILLIN-POT CLAVULANATE 875-125 MG PO TABS: 1.0000 | ORAL_TABLET | Freq: Two times a day (BID) | ORAL | 0 refills | Status: DC

## 2021-05-26 MED ORDER — PROSIGHT PO TABS
1.0000 | ORAL_TABLET | Freq: Every day | ORAL | Status: DC
  Administered 2021-05-26 – 2021-06-04 (×10): 1 via ORAL
  Filled 2021-05-26 (×10): qty 1

## 2021-05-26 NOTE — Progress Notes (Deleted)
[facility] Infectious Diseases Follow Up Note  Patient Identification: Patient Name: Gary Rice MRN: [id number] [place] Date: 05/26/2021  5:46 PM Age: 87 y.o.Today's Date: 05/26/2021   Reason for Visit: E. coli bacteremia/pelvic abscess  Active Problems:   Debility   Antibiotics:  Zosyn 8/16-8/19 Meropenem 8/19-current  Lines/Tubes: Closed system drain in midline abdomen, right arm PICC line  Interval Events: Continues to be afebrile, WBC has been stable at 14.  No plans for surgical intervention by surgery for acute cholecystitis noted. Very minimal out put from drain in the last 24 hrs    Assessment Intra-abdominopelvic abscess in the setting of incarcerated right inguinal hernia status post open mesh repair [id number] complicated by ongoing left lower quadrant ischemia and microperforation status post open small bowel resection with primary anastomosis and repair of microperforation [id number]; complicated with large intra-abdominopelvic abscess status post IR drain placement on 05/13/2021 along with CT showing postoperative changes in the left lower quadrant with small abscesses.  Cultures from IR drain placement 8/16 growing MDR Klebsiella oxytoca and Bacteroides fragilis.  Repeat CT 8/22 showing significant decrease in the size of abscess with placement of pigtail drainage catheter in the pelvic collection measuring 3.4 by 6.6 cm (previously 12 x 11 x 10 cm) unchanged size of previously seen additional smaller collections.  2.  Acute cholecystitis/Cholelithiasis: Evaluated by GI and surgery, plans for no surgical intervention and recommended medical management. LFTs downtrending.   3.  Rash: seems to have resolved, unlikely to be related to meropenem    Recommendations Continue Meropenem for 10-14 days from 8/22 for acute cholecystitis given no surgical intervention done. This will also cover the MDR Klebsiella oxytoca and  Bacteroides fragilis isolated from the pelvic abscess.  Pelvic  abscess drain management/need to repeat scan prior to removal of drainage catheter per IR Patient already has a PICC line placed, needs to be removed after completion of IV abtx Monitor for worsening signs of sepsis for need to repeat CT abd/pelvis  Monitor CBC and BMP on IV abtx Follow up with Surgery regarding if he will need an interval cholecystectomy  ID will sign off for now.   Plan discussed with patient/ID pharmacy and Primary  Rest of the management as per the primary team. Thank you for the consult. Please page with pertinent questions or concerns.  ______________________________________________________________________ Subjective patient seen and examined at the bedside.  Patient is sitting up in the chair.  He says he ate majority of his breakfast this morning.  Denies any nausea, vomiting or diarrhea and abdominal pain.  Denies any fever, chills and sweats.  He states the rash previously seen in his lower extremities and back seems to have resolved.   Vitals BP 115/70 (BP Location: Left Arm)   Pulse 80   Temp 99.1 F (37.3 C) (Oral)   Resp 16   Ht '5\' 11"'$  (1.803 m)   Wt 75.9 kg   SpO2 96%   BMI 23.34 kg/m     Physical Exam Constitutional: Sitting up in the chair, not in acute distress    Comments:   Cardiovascular:     Rate and Rhythm: Normal rate and regular rhythm.     Heart sounds:   Pulmonary:     Effort: Pulmonary effort is normal.     Comments:   Abdominal:     Palpations: Abdomen is soft.     Tenderness: Midline abdominal wound with a bandage, no strikethrough  Musculoskeletal:        General:  No swelling or tenderness.   Skin:    Comments:        Neurological:     General: No focal deficit present.   Psychiatric:        Mood and Affect: Mood normal.   Pertinent Microbiology Results for orders placed or performed during the hospital encounter of 05/02/21  Resp Panel by  RT-PCR (Flu A&B, Covid) Nasopharyngeal Swab     Status: None   Collection Time: 05/02/21  9:40 PM   Specimen: Nasopharyngeal Swab; Nasopharyngeal(NP) swabs in vial transport medium  Result Value Ref Range Status   SARS Coronavirus 2 by RT PCR NEGATIVE NEGATIVE Final    Comment: (NOTE) SARS-CoV-2 target nucleic acids are NOT DETECTED.  The SARS-CoV-2 RNA is generally detectable in upper respiratory specimens during the acute phase of infection. The lowest concentration of SARS-CoV-2 viral copies this assay can detect is 138 copies/mL. A negative result does not preclude SARS-Cov-2 infection and should not be used as the sole basis for treatment or other patient management decisions. A negative result may occur with  improper specimen collection/handling, submission of specimen other than nasopharyngeal swab, presence of viral mutation(s) within the areas targeted by this assay, and inadequate number of viral copies(<138 copies/mL). A negative result must be combined with clinical observations, patient history, and epidemiological information. The expected result is Negative.  Fact Sheet for Patients:  EntrepreneurPulse.com.au  Fact Sheet for Healthcare Providers:  IncredibleEmployment.be  This test is no t yet approved or cleared by the [place] FDA and  has been authorized for detection and/or diagnosis of SARS-CoV-2 by FDA under an Emergency Use Authorization (EUA). This EUA will remain  in effect (meaning this test can be used) for the duration of the COVID-19 declaration under Section 564(b)(1) of the Act, 21 U.S.C.section 360bbb-3(b)(1), unless the authorization is terminated  or revoked sooner.       Influenza A by PCR NEGATIVE NEGATIVE Final   Influenza B by PCR NEGATIVE NEGATIVE Final    Comment: (NOTE) The Xpert Xpress SARS-CoV-2/FLU/RSV plus assay is intended as an aid in the diagnosis of influenza from Nasopharyngeal swab  specimens and should not be used as a sole basis for treatment. Nasal washings and aspirates are unacceptable for Xpert Xpress SARS-CoV-2/FLU/RSV testing.  Fact Sheet for Patients: EntrepreneurPulse.com.au  Fact Sheet for Healthcare Providers: IncredibleEmployment.be  This test is not yet approved or cleared by the [place] FDA and has been authorized for detection and/or diagnosis of SARS-CoV-2 by FDA under an Emergency Use Authorization (EUA). This EUA will remain in effect (meaning this test can be used) for the duration of the COVID-19 declaration under Section 564(b)(1) of the Act, 21 U.S.C. section 360bbb-3(b)(1), unless the authorization is terminated or revoked.  Performed at [facility], [place] [address] [place], [place] [address]   Aerobic/Anaerobic Culture w Gram Stain (surgical/deep wound)     Status: None (Preliminary result)   Collection Time: 05/13/21  4:49 PM   Specimen: Fluid; Abscess  Result Value Ref Range Status   Specimen Description FLUID PELVIS  Final   Special Requests NONE  Final   Gram Stain   Final    NO SQUAMOUS EPITHELIAL CELLS SEEN RARE WBC SEEN ABUNDANT GRAM NEGATIVE RODS FEW GRAM VARIABLE ROD    Culture   Final    MODERATE KLEBSIELLA OXYTOCA ABUNDANT BACTEROIDES FRAGILIS BETA LACTAMASE POSITIVE Sent to [place] for further susceptibility testing. Performed at [facility], [place] [address], [place], [place]  [address]    Report Status PENDING  Incomplete   Organism ID, Bacteria KLEBSIELLA OXYTOCA  Final      Susceptibility   Klebsiella oxytoca - MIC*    AMPICILLIN >=32 RESISTANT Resistant     CEFAZOLIN >=64 RESISTANT Resistant     CEFEPIME 1 SENSITIVE Sensitive     CEFTAZIDIME <=1 SENSITIVE Sensitive     CEFTRIAXONE 16 RESISTANT Resistant     CIPROFLOXACIN <=0.25 SENSITIVE Sensitive     GENTAMICIN <=1 SENSITIVE Sensitive     IMIPENEM <=0.25 SENSITIVE Sensitive     TRIMETH/SULFA <=20  SENSITIVE Sensitive     AMPICILLIN/SULBACTAM >=32 RESISTANT Resistant     PIP/TAZO >=128 RESISTANT Resistant     * MODERATE KLEBSIELLA OXYTOCA    Pertinent Lab. CBC Latest Ref Rng & Units 05/26/2021 05/24/2021 05/23/2021  WBC 4.0 - 10.5 K/uL 14.3(H) 14.2(H) 14.7(H)  Hemoglobin 13.0 - 17.0 g/dL 12.2(L) 11.0(L) 11.4(L)  Hematocrit 39.0 - 52.0 % 36.2(L) 32.7(L) 33.5(L)  Platelets 150 - 400 K/uL 404(H) 403(H) 421(H)   CMP Latest Ref Rng & Units 05/26/2021 05/25/2021 05/24/2021  Glucose 70 - 99 mg/dL 113(H) 124(H) 112(H)  BUN 8 - 23 mg/dL '17 18 15  '$ Creatinine 0.61 - 1.24 mg/dL 0.85 0.81 0.74  Sodium 135 - 145 mmol/L 134(L) 135 133(L)  Potassium 3.5 - 5.1 mmol/L 4.3 4.1 3.9  Chloride 98 - 111 mmol/L 99 101 100  CO2 22 - 32 mmol/L '28 27 27  '$ Calcium 8.9 - 10.3 mg/dL 9.4 8.9 8.7(L)  Total Protein 6.5 - 8.1 g/dL 6.8 6.6 6.1(L)  Total Bilirubin 0.3 - 1.2 mg/dL 0.8 0.9 0.9  Alkaline Phos 38 - 126 U/L 237(H) 274(H) 319(H)  AST 15 - 41 U/L 29 36 50(H)  ALT 0 - 44 U/L 102(H) 129(H) 174(H)     Pertinent Imaging today Plain films and CT images have been personally visualized and interpreted; radiology reports have been reviewed. Decision making incorporated into the Impression / Recommendations.  I spent more than 35 minutes for this patient encounter including review of prior medical records, coordination of care  with greater than 50% of time being face to face/counseling and discussing diagnostics/treatment plan with the patient/family.  Electronically signed by:   Rosiland Oz, MD Infectious Disease Physician [facility] Pager: [phone]

## 2021-05-26 NOTE — Progress Notes (Signed)
Patient ID: Jerrel Ivory, male   DOB: 10-25-1930, 87 y.o.   MRN: [id number] 17 Days Post-Op   Subjective: No abdominal pain Eating Normal BM yesterday ROS negative except as listed above. Objective: Vital signs in last 24 hours: Temp:  [97.5 F (36.4 C)-98.6 F (37 C)] 97.8 F (36.6 C) (08/29 0817) Pulse Rate:  [71-91] 91 (08/29 0817) Resp:  [16-20] 16 (08/29 0817) BP: (104-125)/(59-80) 104/66 (08/29 0817) SpO2:  [95 %-97 %] 96 % (08/29 0817) Last BM Date: 05/25/21  Intake/Output from previous day: 08/28 0701 - 08/29 0700 In: 10  Out: 1620 [Urine:1600; Drains:20] Intake/Output this shift: Total I/O In: 120 [P.O.:120] Out: -   General appearance: alert and cooperative GI: soft, midline has a little fibrinous exudate, R ing CDI, NT  Lab Results: CBC  Recent Labs    05/24/21 0945 05/26/21 0500  WBC 14.2* 14.3*  HGB 11.0* 12.2*  HCT 32.7* 36.2*  PLT 403* 404*   BMET Recent Labs    05/25/21 1000 05/26/21 0500  NA 135 134*  K 4.1 4.3  CL 101 99  CO2 27 28  GLUCOSE 124* 113*  BUN 18 17  CREATININE 0.81 0.85  CALCIUM 8.9 9.4   PT/INR No results for input(s): LABPROT, INR in the last 72 hours. ABG No results for input(s): PHART, HCO3 in the last 72 hours.  Invalid input(s): PCO2, PO2  Studies/Results: No results found.  Anti-infectives: Anti-infectives (From admission, onward)    Start     Dose/Rate Route Frequency Ordered Stop   05/16/21 1745  meropenem (MERREM) 1 g in sodium chloride 0.9 % 100 mL IVPB        1 g 200 mL/hr over 30 Minutes Intravenous Every 8 hours 05/16/21 1655     05/13/21 1515  piperacillin-tazobactam (ZOSYN) IVPB 3.375 g  Status:  Discontinued        3.375 g 12.5 mL/hr over 240 Minutes Intravenous Every 8 hours 05/13/21 1421 05/16/21 1640   05/09/21 0915  ceFAZolin (ANCEF) IVPB 2g/100 mL premix        2 g 200 mL/hr over 30 Minutes Intravenous On call to O.R. 05/09/21 0825 05/09/21 1050       Assessment/Plan: POD 24,  s/p open right inguinal hernia repair with mesh by Dr. Bobbye Morton for incarcerated RIH with persistent ileus vs SBO POD 16, s/p dx lap with mini laparotomy with SBR, Dr. Donne Hazel 05/09/21 - S/p IR drainage 8/16 for IA fluid collection. Cx's w/ Klebsiella Oxytoca and abx switched to merrem 8/19 per s/s. CT 8/22 w/ decrease in collection size, now 3.4 x 6.6. IR increasing flushes and considering upsizing depending on output. No output recorded in last 24 hours. Will follow up on IR recs.  - TID WTD for midline wound  - IS/pulm toilet - Mobilize TID in halls. He is hoping for CIR   Acute cholecystitis - MRCP negative for choledocholithiasis. LFT's downtrending. - asymptomatic - no intervention needed  FEN - Reg diet   VTE - SCDs, LMWH ID - Merrem (ID following and appreciate their recommendations on abx selection and duration).    MP Rash - per TRH and ID. They have ordered benadryl  New onset a fib - on cardizem Ascending thoracic aneurysm HLD  LOS: 24 days    Georganna Skeans, MD, MPH, FACS Trauma & General Surgery Use [email or web address] to contact on call provider  05/26/2021

## 2021-05-26 NOTE — Care Management Important Message (Signed)
Important Message  Patient Details  Name: Gary Rice MRN: [id number] Date of Birth: Jan 17, 1931   Medicare Important Message Given:  Yes     Shelda Altes 05/26/2021, 11:22 AM

## 2021-05-26 NOTE — Progress Notes (Signed)
Physical Therapy Treatment Patient Details Name: Gary Rice MRN: [id number] DOB: [birth year] Today's Date: 05/26/2021    History of Present Illness 87 yo male admitted 8/522 after being referred to ED by PCP secondary to incarcerated inguinal hernia causing SBO, also found to be in Afib with RVR in the ED. Received surgical repair of incarcerated inguinal hernia 05/02/21. NGT placed to wall suction 8/6. Drain placement 05/13/21. PMH hernia repair joint replacement.    PT Comments    Today's skilled session continued to focus on mobility progression. The pt is making steady progress with increased gait distance this session. Acute PT to continue during pt's hospital stay.     Follow Up Recommendations  SNF;Supervision for mobility/OOB     Equipment Recommendations  Rolling walker with 5" wheels    Recommendations for Other Services       Precautions / Restrictions Precautions Precautions: Fall Precaution Comments: JP drain R abdomen Restrictions Weight Bearing Restrictions: No    Mobility  Bed Mobility Overal bed mobility: Needs Assistance Bed Mobility: Sit to Supine       Sit to supine: Supervision   General bed mobility comments: pt able to take self from sitting edge of bed to supine in bed with no rails, HOB flat. increased time/effort needed.    Transfers Overall transfer level: Needs assistance Equipment used: Rolling walker (2 wheeled) Transfers: Sit to/from Stand Sit to Stand: Supervision;Min guard         General transfer comment: supervision from chair with pt demo'ing good technique. min guard assist from low toilet with grab bar used and increased time/effort needed for full standing.  Ambulation/Gait Ambulation/Gait assistance: Min guard Gait Distance (Feet): 120 Feet (with 2 brief standing rest breaks, then 10 feet from bathroom to bed) Assistive device: Rolling walker (2 wheeled) Gait Pattern/deviations: Step-through pattern;Decreased step  length - right;Decreased step length - left;Decreased stride length;Trunk flexed Gait velocity: decreased   General Gait Details: decreased gait speed with no balance issues noted. cues for posture at times with gait. VSS with gait.          Cognition Arousal/Alertness: Awake/alert Behavior During Therapy: WFL for tasks assessed/performed                   General Comments: Pt is self directed, wants to do things his way, however motivated and paticipatory       Pertinent Vitals/Pain Pain Assessment: No/denies pain     PT Goals (current goals can now be found in the care plan section) Acute Rehab PT Goals Patient Stated Goal: To get better PT Goal Formulation: With patient Time For Goal Achievement: 05/28/21 Potential to Achieve Goals: Good Progress towards PT goals: Progressing toward goals    Frequency    Min 3X/week      PT Plan Current plan remains appropriate    AM-PAC PT "6 Clicks" Mobility   Outcome Measure  Help needed turning from your back to your side while in a flat bed without using bedrails?: None Help needed moving from lying on your back to sitting on the side of a flat bed without using bedrails?: A Little Help needed moving to and from a bed to a chair (including a wheelchair)?: A Little Help needed standing up from a chair using your arms (e.g., wheelchair or bedside chair)?: A Little Help needed to walk in hospital room?: A Little Help needed climbing 3-5 steps with a railing? : A Lot 6 Click Score: 18    End  of Session Equipment Utilized During Treatment: Gait belt Activity Tolerance: Patient tolerated treatment well Patient left: in bed;with call bell/phone within reach Nurse Communication: Mobility status PT Visit Diagnosis: Muscle weakness (generalized) (M62.81);Difficulty in walking, not elsewhere classified (R26.2);Other abnormalities of gait and mobility (R26.89)     Time: [id number] PT Time Calculation (min) (ACUTE  ONLY): 26 min  Charges:  $Gait Training: 8-22 mins $Therapeutic Activity: 8-22 mins                    Willow Ora, PTA, [facility] Acute [employer] [phone] 05/26/21, 9:58 AM   Willow Ora 05/26/2021, 9:58 AM

## 2021-05-26 NOTE — Progress Notes (Addendum)
[facility] Infectious Diseases Follow Up Note  Patient Identification: Patient Name: Gary Rice MRN: [id number] [place] Date: 05/02/2021  5:45 PM Age: 87 y.o.Today's Date: 05/26/2021   Reason for Visit: E. coli bacteremia/pelvic abscess  Principal Problem:   Pelvic abscess in male [facility]) Active Problems:   Atrial fibrillation with rapid ventricular response (HCC)   SBO (small bowel obstruction) (HCC)   Leukocytosis   Hyperlipidemia   Thoracic aortic aneurysm (HCC)   Malnutrition of moderate degree   Antibiotics:  Zosyn 8/16-8/19 Meropenem 8/19-current  Lines/Tubes: Closed system drain in midline abdomen, right arm PICC line  Interval Events: Continues to be afebrile, WBC has been stable at 14.  No plans for surgical intervention by surgery for acute cholecystitis noted. Very minimal out put from drain in the last 24 hrs    Assessment Intra-abdominopelvic abscess in the setting of incarcerated right inguinal hernia status post open mesh repair [id number] complicated by ongoing left lower quadrant ischemia and microperforation status post open small bowel resection with primary anastomosis and repair of microperforation [id number]; complicated with large intra-abdominopelvic abscess status post IR drain placement on 05/13/2021 along with CT showing postoperative changes in the left lower quadrant with small abscesses.  Cultures from IR drain placement 8/16 growing MDR Klebsiella oxytoca and Bacteroides fragilis.  Repeat CT 8/22 showing significant decrease in the size of abscess with placement of pigtail drainage catheter in the pelvic collection measuring 3.4 by 6.6 cm (previously 12 x 11 x 10 cm) unchanged size of previously seen additional smaller collections.  2.  Acute cholecystitis/Cholelithiasis: Evaluated by GI and surgery, plans for no surgical intervention and recommended medical management. LFTs downtrending.   3.  Rash:  seems to have resolved, unlikely to be related to meropenem    Recommendations Continue Meropenem for 2 weeks from 8/22 for acute cholecystitis given no surgical intervention done. This will also cover the MDR Klebsiella oxytoca and Bacteroides fragilis isolated from the pelvic abscess.  Pelvic  abscess drain management/need to repeat scan prior to removal of drainage catheter per IR Patient already has a PICC line placed, needs to be removed after completion of IV abtx Monitor for worsening signs of sepsis for need to repeat CT abd/pelvis  Monitor CBC and BMP on IV abtx Follow up with Surgery regarding if he will need an interval cholecystectomy  ID will sign off for now.   Plan discussed with patient/ID pharmacy and Primary  Rest of the management as per the primary team. Thank you for the consult. Please page with pertinent questions or concerns.  ______________________________________________________________________ Subjective patient seen and examined at the bedside.  Patient is sitting up in the chair.  He says he ate majority of his breakfast this morning.  Denies any nausea, vomiting or diarrhea and abdominal pain.  Denies any fever, chills and sweats.  He states the rash previously seen in his lower extremities and back seems to have resolved.   Vitals BP 104/66 (BP Location: Left Arm)   Pulse 91   Temp 97.8 F (36.6 C) (Oral)   Resp 16   Ht '5\' 11"'$  (1.803 m)   Wt 78.4 kg   SpO2 96%   BMI 24.11 kg/m     Physical Exam Constitutional: Sitting up in the chair, not in acute distress    Comments:   Cardiovascular:     Rate and Rhythm: Normal rate and regular rhythm.     Heart sounds:   Pulmonary:     Effort: Pulmonary effort is  normal.     Comments:   Abdominal:     Palpations: Abdomen is soft.     Tenderness: Midline abdominal wound with a bandage, no strikethrough  Musculoskeletal:        General: No swelling or tenderness.   Skin:    Comments:         Neurological:     General: No focal deficit present.   Psychiatric:        Mood and Affect: Mood normal.   Pertinent Microbiology Results for orders placed or performed during the hospital encounter of 05/02/21  Resp Panel by RT-PCR (Flu A&B, Covid) Nasopharyngeal Swab     Status: None   Collection Time: 05/02/21  9:40 PM   Specimen: Nasopharyngeal Swab; Nasopharyngeal(NP) swabs in vial transport medium  Result Value Ref Range Status   SARS Coronavirus 2 by RT PCR NEGATIVE NEGATIVE Final    Comment: (NOTE) SARS-CoV-2 target nucleic acids are NOT DETECTED.  The SARS-CoV-2 RNA is generally detectable in upper respiratory specimens during the acute phase of infection. The lowest concentration of SARS-CoV-2 viral copies this assay can detect is 138 copies/mL. A negative result does not preclude SARS-Cov-2 infection and should not be used as the sole basis for treatment or other patient management decisions. A negative result may occur with  improper specimen collection/handling, submission of specimen other than nasopharyngeal swab, presence of viral mutation(s) within the areas targeted by this assay, and inadequate number of viral copies(<138 copies/mL). A negative result must be combined with clinical observations, patient history, and epidemiological information. The expected result is Negative.  Fact Sheet for Patients:  EntrepreneurPulse.com.au  Fact Sheet for Healthcare Providers:  IncredibleEmployment.be  This test is no t yet approved or cleared by the [place] FDA and  has been authorized for detection and/or diagnosis of SARS-CoV-2 by FDA under an Emergency Use Authorization (EUA). This EUA will remain  in effect (meaning this test can be used) for the duration of the COVID-19 declaration under Section 564(b)(1) of the Act, 21 U.S.C.section 360bbb-3(b)(1), unless the authorization is terminated  or revoked sooner.        Influenza A by PCR NEGATIVE NEGATIVE Final   Influenza B by PCR NEGATIVE NEGATIVE Final    Comment: (NOTE) The Xpert Xpress SARS-CoV-2/FLU/RSV plus assay is intended as an aid in the diagnosis of influenza from Nasopharyngeal swab specimens and should not be used as a sole basis for treatment. Nasal washings and aspirates are unacceptable for Xpert Xpress SARS-CoV-2/FLU/RSV testing.  Fact Sheet for Patients: EntrepreneurPulse.com.au  Fact Sheet for Healthcare Providers: IncredibleEmployment.be  This test is not yet approved or cleared by the [place] FDA and has been authorized for detection and/or diagnosis of SARS-CoV-2 by FDA under an Emergency Use Authorization (EUA). This EUA will remain in effect (meaning this test can be used) for the duration of the COVID-19 declaration under Section 564(b)(1) of the Act, 21 U.S.C. section 360bbb-3(b)(1), unless the authorization is terminated or revoked.  Performed at [facility], [place] [address], [place], [place] [address]   Aerobic/Anaerobic Culture w Gram Stain (surgical/deep wound)     Status: None (Preliminary result)   Collection Time: 05/13/21  4:49 PM   Specimen: Fluid; Abscess  Result Value Ref Range Status   Specimen Description FLUID PELVIS  Final   Special Requests NONE  Final   Gram Stain   Final    NO SQUAMOUS EPITHELIAL CELLS SEEN RARE WBC SEEN ABUNDANT GRAM NEGATIVE RODS FEW GRAM  VARIABLE ROD    Culture   Final    MODERATE KLEBSIELLA OXYTOCA ABUNDANT BACTEROIDES FRAGILIS BETA LACTAMASE POSITIVE Sent to [place] for further susceptibility testing. Performed at [facility], [place] [address] [place], [place] [address]    Report Status PENDING  Incomplete   Organism ID, Bacteria KLEBSIELLA OXYTOCA  Final      Susceptibility   Klebsiella oxytoca - MIC*    AMPICILLIN >=32 RESISTANT Resistant     CEFAZOLIN >=64 RESISTANT Resistant     CEFEPIME 1 SENSITIVE  Sensitive     CEFTAZIDIME <=1 SENSITIVE Sensitive     CEFTRIAXONE 16 RESISTANT Resistant     CIPROFLOXACIN <=0.25 SENSITIVE Sensitive     GENTAMICIN <=1 SENSITIVE Sensitive     IMIPENEM <=0.25 SENSITIVE Sensitive     TRIMETH/SULFA <=20 SENSITIVE Sensitive     AMPICILLIN/SULBACTAM >=32 RESISTANT Resistant     PIP/TAZO >=128 RESISTANT Resistant     * MODERATE KLEBSIELLA OXYTOCA    Pertinent Lab. CBC Latest Ref Rng & Units 05/26/2021 05/24/2021 05/23/2021  WBC 4.0 - 10.5 K/uL 14.3(H) 14.2(H) 14.7(H)  Hemoglobin 13.0 - 17.0 g/dL 12.2(L) 11.0(L) 11.4(L)  Hematocrit 39.0 - 52.0 % 36.2(L) 32.7(L) 33.5(L)  Platelets 150 - 400 K/uL 404(H) 403(H) 421(H)   CMP Latest Ref Rng & Units 05/26/2021 05/25/2021 05/24/2021  Glucose 70 - 99 mg/dL 113(H) 124(H) 112(H)  BUN 8 - 23 mg/dL '17 18 15  '$ Creatinine 0.61 - 1.24 mg/dL 0.85 0.81 0.74  Sodium 135 - 145 mmol/L 134(L) 135 133(L)  Potassium 3.5 - 5.1 mmol/L 4.3 4.1 3.9  Chloride 98 - 111 mmol/L 99 101 100  CO2 22 - 32 mmol/L '28 27 27  '$ Calcium 8.9 - 10.3 mg/dL 9.4 8.9 8.7(L)  Total Protein 6.5 - 8.1 g/dL 6.8 6.6 6.1(L)  Total Bilirubin 0.3 - 1.2 mg/dL 0.8 0.9 0.9  Alkaline Phos 38 - 126 U/L 237(H) 274(H) 319(H)  AST 15 - 41 U/L 29 36 50(H)  ALT 0 - 44 U/L 102(H) 129(H) 174(H)     Pertinent Imaging today Plain films and CT images have been personally visualized and interpreted; radiology reports have been reviewed. Decision making incorporated into the Impression / Recommendations.  I spent more than 35 minutes for this patient encounter including review of prior medical records, coordination of care  with greater than 50% of time being face to face/counseling and discussing diagnostics/treatment plan with the patient/family.  Electronically signed by:   Rosiland Oz, MD Infectious Disease Physician [facility] Pager: [phone]

## 2021-05-26 NOTE — TOC Transition Note (Signed)
Transition of Care (TOC) - CM/SW Discharge Note Marvetta Gibbons RN, BSN Transitions of Care Unit 4E- RN Case Manager See Treatment Team for direct phone #    Patient Details  Name: Gary Rice MRN: [id number] Date of Birth: [birth year]  Transition of Care [facility]) CM/SW Contact:  Dawayne Patricia, RN Phone Number: 05/26/2021, 11:17 AM   Clinical Narrative:    Pt has been cleared medically stable for transition to [facility] rehab. Have confirmed with CIR AC- [facility] that pt has a bed available today- plan will be for CIR to admit pt to rehab later this afternoon.     Final next level of care: IP Rehab Facility Barriers to Discharge: Barriers Resolved   Patient Goals and CMS Choice Patient states their goals for this hospitalization and ongoing recovery are:: To get stronger and get out of here CMS Medicare.gov Compare Post Acute Care list provided to:: Patient Choice offered to / list presented to : Patient  Discharge Placement                 [facility]      Discharge Plan and Services In-house Referral: Clinical Social Work Discharge Planning Services: CM Consult Post Acute Care Choice: IP Rehab, [place]          DME Arranged: N/A DME Agency: NA       HH Arranged: RN, PT, OT HH Agency: [place] Date [place]: 05/07/21 Time [place]: 28 Representative spoke with at [place]: [place] ([place]) Interventions     Readmission Risk Interventions Readmission Risk Prevention Plan 05/26/2021  Post Dischage Appt Not Complete  Appt Comments pt going to INPT rehab  Medication Screening Complete  Transportation Screening Complete  Some recent data might be hidden

## 2021-05-26 NOTE — H&P (Signed)
Physical Medicine and Rehabilitation Admission H&P    Chief Complaint  Patient presents with   Functional deficits   due to SBO, surgery and multiple medical issues.      HPI:  Gary Rice is an 87 year old male with history of SVT, bilateral inginal hernias who was admitted on 05/02/21 with vomiting due to incarcerated hernia w/SBO as well as new onset of Afib w flutter. He was started on IV diltiazem for rate control per Dr. Rudi Rummage with recommendations to start Eliquis once cleared and was taken to OR emergently for repair of incarcerated hernia by Dr. Bobbye Morton the same day.  He was also found to have incidental ascending aortic aneurysm and to follow up with Dr. Etter Sjogren with f/u in A fib clinic per Dr. Rayann Heman. Hospital course significant for increase in abdominal distension once NGT removed due to SBO with microperforation and was taken to OR 08/12 for open small bowel resection by Dr. Donne Hazel. He was maintained on TNA for nutritional support He started developing rise in WBC on 08/15 due to development of pelvic abscess which was drained and pelvic drain placed by Dr. Dwaine Gale on 08/16.  Cultures grew out Klebsiella Oxytoca/Bacteriodes Fragilis which was initially treated with Zosyn (R).  ID consulted for input  and antibiotics changed to Meropenum. He developed rise in LFTs due to cholecystitis with cholelithiasis and follow up CT showed significant decrease in size abscess. Dr. Paulita Fujita consulted for input due to dilated CBD and recommended MRCP which showed acute cholecystitis but was negative for choledocholithiasis without significant CBD dilatation or obstructing liver mass.  GI/General surgery recommends conservative care with antibiotics for treatment as LFTs improving and patient tolerating diet with pain or N/V. If patient develops symptoms may need perc chole drain. He did develop rash on 08/25 --likely drug eruption which is being monitored (Cipro/Bactrim less ideal given age) with  benadryl prn itching.  Midline incision healing, he is tolerating po's with + BM but continues to have limitations in mobility and ability to carry out ADLs. CIR recommended due to functional decline.  He is eager to return home as soon as physically able- worried that his wife is home alone.   Review of Systems  HENT:  Negative for hearing loss and tinnitus.   Eyes:  Negative for blurred vision and double vision.  Respiratory:  Negative for cough and shortness of breath.   Cardiovascular:  Negative for chest pain and palpitations.  Gastrointestinal:  Negative for abdominal pain, constipation, heartburn and nausea.  Genitourinary:  Negative for dysuria and urgency.  Musculoskeletal:  Negative for back pain and myalgias.  Skin:  Negative for rash.  Neurological:  Positive for speech change, weakness and headaches. Negative for dizziness.  Psychiatric/Behavioral:  The patient is not nervous/anxious and does not have insomnia.     Past Medical History:  Diagnosis Date   Astigmatism    Basal cell carcinoma    Inguinal hernia    bilateral   Macular degeneration, age related, nonexudative    bilateral   SVT (supraventricular tachycardia) [facility])     Past Surgical History:  Procedure Laterality Date   APPENDECTOMY     age 25   BOWEL RESECTION  05/09/2021   Procedure: SMALL BOWEL RESECTION;  Surgeon: Rolm Bookbinder, MD;  Location: [place];  Service: General;;   CATARACT EXTRACTION, BILATERAL     HERNIA REPAIR     RIH   INGUINAL HERNIA REPAIR Right 05/02/2021   Procedure: REPAIR  INCARCERATED INGUINAL HERNIA  WITH MESH;  Surgeon: Jesusita Oka, MD;  Location: [place];  Service: General;  Laterality: Right;   JOINT REPLACEMENT Right    RTHA   LAPAROSCOPY N/A 05/09/2021   Procedure: LAPAROSCOPY DIAGNOSTIC;  Surgeon: Rolm Bookbinder, MD;  Location: [place];  Service: General;  Laterality: N/A;   LAPAROTOMY  05/09/2021   Procedure: EXPLORATORY LAPAROTOMY;  Surgeon: Rolm Bookbinder, MD;   Location: [facility] OR;  Service: General;;   TONSILLECTOMY      Family History  Problem Relation Age of Onset   Heart disease Father    Heart attack Father    Heart attack Son    Sleep apnea Son     Social History:  Married. Retired Pharmacist, hospital. He reports that he quit smoking about 70 years ago. His smoking use included cigarettes. He has never used smokeless tobacco. He reports that he does not drink alcohol and does not use drugs.   Allergies: No Known Allergies   Medications Prior to Admission  Medication Sig Dispense Refill   apixaban (ELIQUIS) 5 MG TABS tablet Take 2 tablets (68m) twice daily for 7 days, then 1 tablet (570m twice daily 60 tablet 0   aspirin EC 81 MG tablet Take 81 mg by mouth at bedtime.     atorvastatin (LIPITOR) 10 MG tablet Take 10 mg by mouth at bedtime.     diltiazem (CARDIZEM) 30 MG tablet TAKE 1 TABLET AS NEEDED FOR RAPID HEART BEAT (Patient taking differently: Take 30 mg by mouth daily as needed (rapid heart beat).) 30 tablet 3   fluticasone (FLONASE) 50 MCG/ACT nasal spray Place 1 spray into both nostrils daily.     meropenem (MERREM) IVPB Inject 1 g into the vein every 8 (eight) hours for 7 days. Indication:  Cholecystitis/pelvic abscess  First Dose: Yes Last Day of Therapy:  06/02/21 Labs - Once weekly:  CBC/D and BMP, Labs - Every other week:  ESR and CRP Method of administration: Mini-Bag Plus / Gravity Method of administration may be changed at the discretion of home infusion pharmacist based upon assessment of the patient and/or caregiver's ability to self-administer the medication ordered. 21 Units 0   Multiple Vitamin (MULTIVITAMIN WITH MINERALS) TABS tablet Take 1 tablet by mouth daily.     Multiple Vitamins-Minerals (PRESERVISION AREDS 2) CAPS Take 1 capsule 2 (two) times daily by mouth.     VITAMIN E PO Take 1 capsule by mouth daily.       Drug Regimen Review  Drug regimen was reviewed and remains appropriate with no significant issues  identified  Home: Home Living Family/patient expects to be discharged to:: Private residence Living Arrangements: Spouse/significant other Available Help at Discharge: Family, Available 24 hours/day Type of Home: House Home Access: Stairs to enter [employer] Steps: 11 steps in garage Entrance Stairs-Rails: Right Home Layout: Two level, Other (Comment) (There is a bedroom and a bathroom on 1st floor) Alternate Level Stairs-Number of Steps: 17 steps Alternate Level Stairs-Rails: Can reach both Bathroom Shower/Tub: [employer] Bathroom Accessibility: Yes Home Equipment: Grab bars - tub/shower, Cane - single point Additional Comments: one fall recently- fell making a turn into the bathroom and hit hard on R hip but no injuries besides bruising. only uses SPC PRN  Lives With: Spouse   Functional History: Prior Function Level of Independence: Independent with assistive device(s) Comments: cuts own lawn, still driving, going to grocery store; still managing bills and meds   Functional Status:  Mobility: Bed Mobility Overal bed mobility: Needs Assistance Bed Mobility: Sit to Supine Rolling: Supervision Sidelying to sit: Supervision, HOB elevated Supine to sit: Min guard Sit to supine: Supervision General bed mobility comments: pt able to take self from sitting edge of bed to supine in bed with no rails, HOB flat. increased time/effort needed. Transfers Overall transfer level: Needs assistance Equipment used: Rolling walker (2 wheeled) Transfers: Sit to/from Stand Sit to Stand: Supervision, Min guard Stand pivot transfers: Min guard General transfer comment: supervision from chair with pt demo'ing good technique. min guard assist from low toilet with grab bar used and increased time/effort needed for full standing. Ambulation/Gait Ambulation/Gait assistance: Min guard Gait Distance (Feet): 120 Feet (with 2 brief standing rest breaks,  then 10 feet from bathroom to bed) Assistive device: Rolling walker (2 wheeled) Gait Pattern/deviations: Step-through pattern, Decreased step length - right, Decreased step length - left, Decreased stride length, Trunk flexed General Gait Details: decreased gait speed with no balance issues noted. cues for posture at times with gait. VSS with gait. Gait velocity: decreased Gait velocity interpretation: <1.31 ft/sec, indicative of household ambulator Stairs:  (pt defers to attempt due to fatigue) Stairs assistance: Min guard Stair Management: Backwards, Forwards, With walker Number of Stairs: 1 (x6) General stair comments: able to step up x 6 to RW over single step leading with RLE,   ADL: ADL Overall ADL's : Needs assistance/impaired Eating/Feeding: Independent, Bed level, Sitting Grooming: Wash/dry hands, Wash/dry face, Min guard, Standing Grooming Details (indicate cue type and reason): completed at sink Upper Body Bathing: Set up, Sitting Lower Body Bathing: Minimal assistance, Sitting/lateral leans, Sit to/from stand Lower Body Bathing Details (indicate cue type and reason): Min A for throroughness due to safety and pt fatigue after ambulating in hallway Upper Body Dressing : Modified independent, Set up, Sitting (Sitting up at EOB (required RN assist to unconnect and reconnect IV while changing hospital gown).) Lower Body Dressing: Min guard, Sit to/from stand, Sitting/lateral leans Toilet Transfer: Min guard, Ambulation Toilet Transfer Details (indicate cue type and reason): Pt in hallway, ambulated back to room and wanted to use BSC this session Toileting- Clothing Manipulation and Hygiene: Minimal assistance, Sitting/lateral lean, Sit to/from stand Toileting - Clothing Manipulation Details (indicate cue type and reason): Min A for thoroughness Tub/ Shower Transfer: Min guard, Ambulation Functional mobility during ADLs: Min guard, Rolling walker General ADL Comments: Pt was  agreeable to bilateral UE strengthening HEP using level 3 theraband sitting up in bed. Pt reports that he sat up for "too long" yesterday and is waiting for lunch to arrive.   Cognition: Cognition Overall Cognitive Status: Within Functional Limits for tasks assessed Orientation Level: Oriented X4 Cognition Arousal/Alertness: Awake/alert Behavior During Therapy: WFL for tasks assessed/performed Overall Cognitive Status: Within Functional Limits for tasks assessed General Comments: Pt is self directed, wants to do things his way, however motivated and paticipatory   Blood pressure 106/74, pulse 86, temperature 98 F (36.7 C), resp. rate 18, height _0  (1.803 m), weight 75.9 kg, SpO2 98 %. Physical Exam Gen: no distress, normal appearing HEENT: oral mucosa pink and moist, NCAT Cardio: Reg rate Chest: normal effort, normal rate of breathing Abd: RLQ pelvic drain with milky drainage. Midline incision with bulky dry dressing. Ext: atrophy of bilateral lower extremities Psych: pleasant, normal affect, joking Skin:Skin is pale.  Neurological:     Mental Status: He is alert and oriented to person, place, and time.     Comments: Dysphonia since surgery but  no swallowing issues. Mild hearing loss. Able to follow commands without difficulty. Strength and sensation intact.    Results for orders placed or performed during the hospital encounter of 05/02/21 (from the past 48 hour(s))  Comprehensive metabolic panel     Status: Abnormal   Collection Time: 05/25/21 10:00 AM  Result Value Ref Range   Sodium 135 135 - 145 mmol/L   Potassium 4.1 3.5 - 5.1 mmol/L   Chloride 101 98 - 111 mmol/L   CO2 27 22 - 32 mmol/L   Glucose, Bld 124 (H) 70 - 99 mg/dL    Comment: Glucose reference range applies only to samples taken after fasting for at least 8 hours.   BUN 18 8 - 23 mg/dL   Creatinine, Ser 0.81 0.61 - 1.24 mg/dL   Calcium 8.9 8.9 - 10.3 mg/dL   Total Protein 6.6 6.5 - 8.1 g/dL   Albumin  2.0 (L) 3.5 - 5.0 g/dL   AST 36 15 - 41 U/L   ALT 129 (H) 0 - 44 U/L   Alkaline Phosphatase 274 (H) 38 - 126 U/L   Total Bilirubin 0.9 0.3 - 1.2 mg/dL   GFR, Estimated >60 >60 mL/min    Comment: (NOTE) Calculated using the CKD-EPI Creatinine Equation (2021)    Anion gap 7 5 - 15    Comment: Performed at [facility], [place] [address] [place], [place] [address]  Comprehensive metabolic panel     Status: Abnormal   Collection Time: 05/26/21  5:00 AM  Result Value Ref Range   Sodium 134 (L) 135 - 145 mmol/L   Potassium 4.3 3.5 - 5.1 mmol/L   Chloride 99 98 - 111 mmol/L   CO2 28 22 - 32 mmol/L   Glucose, Bld 113 (H) 70 - 99 mg/dL    Comment: Glucose reference range applies only to samples taken after fasting for at least 8 hours.   BUN 17 8 - 23 mg/dL   Creatinine, Ser 0.85 0.61 - 1.24 mg/dL   Calcium 9.4 8.9 - 10.3 mg/dL   Total Protein 6.8 6.5 - 8.1 g/dL   Albumin 2.2 (L) 3.5 - 5.0 g/dL   AST 29 15 - 41 U/L   ALT 102 (H) 0 - 44 U/L   Alkaline Phosphatase 237 (H) 38 - 126 U/L   Total Bilirubin 0.8 0.3 - 1.2 mg/dL   GFR, Estimated >60 >60 mL/min    Comment: (NOTE) Calculated using the CKD-EPI Creatinine Equation (2021)    Anion gap 7 5 - 15    Comment: Performed at [facility], [place] [address], [place], [place] [address]  CBC     Status: Abnormal   Collection Time: 05/26/21  5:00 AM  Result Value Ref Range   WBC 14.3 (H) 4.0 - 10.5 K/uL   RBC 3.91 (L) 4.22 - 5.81 MIL/uL   Hemoglobin 12.2 (L) 13.0 - 17.0 g/dL   HCT 36.2 (L) 39.0 - 52.0 %   MCV 92.6 80.0 - 100.0 fL   MCH 31.2 26.0 - 34.0 pg   MCHC 33.7 30.0 - 36.0 g/dL   RDW 14.0 11.5 - 15.5 %   Platelets 404 (H) 150 - 400 K/uL   nRBC 0.0 0.0 - 0.2 %    Comment: Performed at [facility], [place] [address], [place], [place] [address]   No results found.     Medical Problem List and Plan: 1.  Debility secondary to pelvic abscess  -patient may  shower but incision must be covered  -ELOS/Goals: 5-7 days  modI/S  -Admit to CIR 2.  Antithrombotics: -DVT/anticoagulation:  Pharmaceutical: Other (comment) Eliquis started today.  -antiplatelet therapy: N/A 3. Post-operative pain: Will decrese tylenol to 325 mg tid for pain. Check LFTs tomorrow.  4. Mood: LCSW to follow for evaluation and support.   -antipsychotic agents: N/A 5. Neuropsych: This patient is capable of making decisions on his own behalf. 6. Skin/Wound Care: TID dressing changes to midline wound.   --Continue ensure TID to promote wound healing 7. Fluids/Electrolytes/Nutrition: Monitor I/O. Check labs in am 8. Cholecystitis: Monitor for rise in LFTs, rise in WBC or pain --Continue to monitor LFTs twice a week for now --Decrease tylenol to 325 mg TID.  --Continue Meropenum X 2 weeks w/end date 09/05 9. Pelvic abscess a/p drain placement: Cultures +Kleb oxytoca--> Meropenum (S) thorough --monitor for SE/tolerance of antibiotics  10. Hyponatremia: Slowly improving. 11. Leucocytosis: WBC trending down from 26.5-->14.3  --monitor for fevers and other signs of infection.  12. Low calorie malnutrition: Alb-2.2. Will add juven additionally to help promote wound healing.  13.  Pre-diabetes: Hgb A1C-5.8. Fasting BS reasonable. Change Ensure to Ensure Max.  14. H/o  SVT/New onset AFib: Monitor HR TID--continue Diltiazem 120 mg TID with Eliquis bid  --off statin due to Abnormal LFTs.  15. Acute blood loss anemia: Will add MVI with iron supplement   I have personally performed a face to face diagnostic evaluation, including, but not limited to relevant history and physical exam findings, of this patient and developed relevant assessment and plan.  Additionally, I have reviewed and concur with the physician assistant's documentation above.  Izora Ribas, MD 05/26/2021   Bary Leriche, PA-C

## 2021-05-26 NOTE — Progress Notes (Signed)
Patient arrived at 63. Denies pain or discomfort. V/S WNL. States his dinner is to arrive at 1830. Patient up in high back chair eating dinner. Will endorse admission on to [place] nurse

## 2021-05-26 NOTE — H&P (Signed)
Physical Medicine and Rehabilitation Admission H&P    Chief Complaint  Patient presents with   Functional deficits   due to SBO, surgery and multiple medical issues.      HPI:  Gary Rice is an 87 year old male with history of SVT, bilateral inginal hernias who was admitted on 05/02/21 with vomiting due to incarcerated hernia w/SBO as well as new onset of Afib w flutter. He was started on IV diltiazem for rate control per Dr. Rudi Rummage with recommendations to start Eliquis once cleared and was taken to OR emergently for repair of incarcerated hernia by Dr. Bobbye Morton the same day.  He was also found to have incidental ascending aortic aneurysm and to follow up with Dr. Etter Sjogren with f/u in [facility] fib clinic per Dr. Rayann Heman. Hospital course significant for increase in abdominal distension once NGT removed due to SBO with microperforation and was taken to OR 08/12 for open small bowel resection by Dr. Donne Hazel. He was maintained on TNA for nutritional support He started developing rise in WBC on 08/15 due to development of pelvic abscess which was drained and pelvic drain placed by Dr. Dwaine Gale on 08/16.  Cultures grew out Klebsiella Oxytoca/Bacteriodes Fragilis which was initially treated with Zosyn (R).  ID consulted for input  and antibiotics changed to Meropenum. He developed rise in LFTs due to cholecystitis with cholelithiasis and follow up CT showed significant decrease in size abscess. Dr. Paulita Fujita consulted for input due to dilated CBD and recommended MRCP which showed acute cholecystitis but was negative for choledocholithiasis without significant CBD dilatation or obstructing liver mass.  GI/General surgery recommends conservative care with antibiotics for treatment as LFTs improving and patient tolerating diet with pain or N/V. If patient develops symptoms may need perc chole drain. He did develop rash on 08/25 --likely drug eruption which is being monitored (Cipro/Bactrim less ideal given age) with  benadryl prn itching.  Midline incision healing, he is tolerating po's with + BM but continues to have limitations in mobility and ability to carry out ADLs. CIR recommended due to functional decline.  He is eager to return home as soon as physically able- worried that his wife is home alone.   Review of Systems  HENT:  Negative for hearing loss and tinnitus.   Eyes:  Negative for blurred vision and double vision.  Respiratory:  Negative for cough and shortness of breath.   Cardiovascular:  Negative for chest pain and palpitations.  Gastrointestinal:  Negative for abdominal pain, constipation, heartburn and nausea.  Genitourinary:  Negative for dysuria and urgency.  Musculoskeletal:  Negative for back pain and myalgias.  Skin:  Negative for rash.  Neurological:  Positive for speech change, weakness and headaches. Negative for dizziness.  Psychiatric/Behavioral:  The patient is not nervous/anxious and does not have insomnia.     Past Medical History:  Diagnosis Date   Astigmatism    Basal cell carcinoma    Inguinal hernia    bilateral   Macular degeneration, age related, nonexudative    bilateral   SVT (supraventricular tachycardia) [facility])     Past Surgical History:  Procedure Laterality Date   APPENDECTOMY     age 25   BOWEL RESECTION  05/09/2021   Procedure: SMALL BOWEL RESECTION;  Surgeon: Rolm Bookbinder, MD;  Location: [place];  Service: General;;   CATARACT EXTRACTION, BILATERAL     HERNIA REPAIR     RIH   INGUINAL HERNIA REPAIR Right 05/02/2021   Procedure: REPAIR  INCARCERATED INGUINAL HERNIA  WITH MESH;  Surgeon: Jesusita Oka, MD;  Location: [place];  Service: General;  Laterality: Right;   JOINT REPLACEMENT Right    RTHA   LAPAROSCOPY N/A 05/09/2021   Procedure: LAPAROSCOPY DIAGNOSTIC;  Surgeon: Rolm Bookbinder, MD;  Location: [place];  Service: General;  Laterality: N/A;   LAPAROTOMY  05/09/2021   Procedure: EXPLORATORY LAPAROTOMY;  Surgeon: Rolm Bookbinder, MD;   Location: [facility] OR;  Service: General;;   TONSILLECTOMY      Family History  Problem Relation Age of Onset   Heart disease Father    Heart attack Father    Heart attack Son    Sleep apnea Son     Social History:  Married. Retired Pharmacist, hospital. He reports that he quit smoking about 70 years ago. His smoking use included cigarettes. He has never used smokeless tobacco. He reports that he does not drink alcohol and does not use drugs.   Allergies: No Known Allergies   Medications Prior to Admission  Medication Sig Dispense Refill   aspirin EC 81 MG tablet Take 81 mg by mouth at bedtime.     atorvastatin (LIPITOR) 10 MG tablet Take 10 mg by mouth at bedtime.     diltiazem (CARDIZEM) 30 MG tablet TAKE 1 TABLET AS NEEDED FOR RAPID HEART BEAT (Patient taking differently: Take 30 mg by mouth daily as needed (rapid heart beat).) 30 tablet 3   fluticasone (FLONASE) 50 MCG/ACT nasal spray Place 1 spray into both nostrils daily.     Multiple Vitamin (MULTIVITAMIN WITH MINERALS) TABS tablet Take 1 tablet by mouth daily.     Multiple Vitamins-Minerals (PRESERVISION AREDS 2) CAPS Take 1 capsule 2 (two) times daily by mouth.     VITAMIN E PO Take 1 capsule by mouth daily.       Drug Regimen Review  Drug regimen was reviewed and remains appropriate with no significant issues identified  Home: Home Living Family/patient expects to be discharged to:: Private residence Living Arrangements: Spouse/significant other Available Help at Discharge: Family, Available 24 hours/day Type of Home: House Home Access: Stairs to enter [employer] of Steps: 11 steps in garage Entrance Stairs-Rails: Right Home Layout: Two level, Other (Comment) (There is a bedroom and a bathroom on 1st floor) Alternate Level Stairs-Number of Steps: 17 steps Alternate Level Stairs-Rails: Can reach both Bathroom Shower/Tub: Multimedia programmer: Standard Bathroom Accessibility: Yes Home Equipment:  Grab bars - tub/shower, Cane - single point Additional Comments: one fall recently- fell making a turn into the bathroom and hit hard on R hip but no injuries besides bruising. only uses SPC PRN  Lives With: Spouse   Functional History: Prior Function Level of Independence: Independent with assistive device(s) Comments: cuts own lawn, still driving, going to grocery store; still managing bills and meds  Functional Status:  Mobility: Bed Mobility Overal bed mobility: Needs Assistance Bed Mobility: Sit to Supine Rolling: Supervision Sidelying to sit: Supervision, HOB elevated Supine to sit: Min guard Sit to supine: Supervision General bed mobility comments: pt able to take self from sitting edge of bed to supine in bed with no rails, HOB flat. increased time/effort needed. Transfers Overall transfer level: Needs assistance Equipment used: Rolling walker (2 wheeled) Transfers: Sit to/from Stand Sit to Stand: Supervision, Min guard Stand pivot transfers: Min guard General transfer comment: supervision from chair with pt demo'ing good technique. min guard assist from low toilet with grab bar used and increased time/effort needed for full standing. Ambulation/Gait  Ambulation/Gait assistance: Min guard Gait Distance (Feet): 120 Feet (with 2 brief standing rest breaks, then 10 feet from bathroom to bed) Assistive device: Rolling walker (2 wheeled) Gait Pattern/deviations: Step-through pattern, Decreased step length - right, Decreased step length - left, Decreased stride length, Trunk flexed General Gait Details: decreased gait speed with no balance issues noted. cues for posture at times with gait. VSS with gait. Gait velocity: decreased Gait velocity interpretation: <1.31 ft/sec, indicative of household ambulator Stairs:  (pt defers to attempt due to fatigue) Stairs assistance: Min guard Stair Management: Backwards, Forwards, With walker Number of Stairs: 1 (x6) General stair comments:  able to step up x 6 to RW over single step leading with RLE,    ADL: ADL Overall ADL's : Needs assistance/impaired Eating/Feeding: Independent, Bed level, Sitting Grooming: Wash/dry hands, Wash/dry face, Min guard, Standing Grooming Details (indicate cue type and reason): completed at sink Upper Body Bathing: Set up, Sitting Lower Body Bathing: Minimal assistance, Sitting/lateral leans, Sit to/from stand Lower Body Bathing Details (indicate cue type and reason): Min A for throroughness due to safety and pt fatigue after ambulating in hallway Upper Body Dressing : Modified independent, Set up, Sitting (Sitting up at EOB (required RN assist to unconnect and reconnect IV while changing hospital gown).) Lower Body Dressing: Min guard, Sit to/from stand, Sitting/lateral leans Toilet Transfer: Min guard, Ambulation Toilet Transfer Details (indicate cue type and reason): Pt in hallway, ambulated back to room and wanted to use BSC this session Toileting- Clothing Manipulation and Hygiene: Minimal assistance, Sitting/lateral lean, Sit to/from stand Toileting - Clothing Manipulation Details (indicate cue type and reason): Min A for thoroughness Tub/ Shower Transfer: Min guard, Ambulation Functional mobility during ADLs: Min guard, Rolling walker General ADL Comments: Pt was agreeable to bilateral UE strengthening HEP using level 3 theraband sitting up in bed. Pt reports that he sat up for "too long" yesterday and is waiting for lunch to arrive.  Cognition: Cognition Overall Cognitive Status: Within Functional Limits for tasks assessed Orientation Level: Oriented X4 Cognition Arousal/Alertness: Awake/alert Behavior During Therapy: WFL for tasks assessed/performed Overall Cognitive Status: Within Functional Limits for tasks assessed General Comments: Pt is self directed, wants to do things his way, however motivated and paticipatory   Blood pressure 112/72, pulse 85, temperature 97.8 F (36.6  C), temperature source Oral, resp. rate 20, height '5\' 11"'$  (1.803 m), weight 78.4 kg, SpO2 97 %. Physical Exam Gen: no distress, normal appearing HEENT: oral mucosa pink and moist, NCAT Cardio: Reg rate Chest: normal effort, normal rate of breathing Abd: RLQ pelvic drain with milky drainage. Midline incision with bulky dry dressing. Ext: no edema Psych: pleasant, normal affect, joking Skin:Skin is pale.  Neurological:     Mental Status: He is alert and oriented to person, place, and time.     Comments: Dysphonia since surgery but no swallowing issues. Mild hearing loss. Able to follow commands without difficulty.      Results for orders placed or performed during the hospital encounter of 05/02/21 (from the past 48 hour(s))  Comprehensive metabolic panel     Status: Abnormal   Collection Time: 05/25/21 10:00 AM  Result Value Ref Range   Sodium 135 135 - 145 mmol/L   Potassium 4.1 3.5 - 5.1 mmol/L   Chloride 101 98 - 111 mmol/L   CO2 27 22 - 32 mmol/L   Glucose, Bld 124 (H) 70 - 99 mg/dL    Comment: Glucose reference range applies only to samples taken after fasting  for at least 8 hours.   BUN 18 8 - 23 mg/dL   Creatinine, Ser 0.81 0.61 - 1.24 mg/dL   Calcium 8.9 8.9 - 10.3 mg/dL   Total Protein 6.6 6.5 - 8.1 g/dL   Albumin 2.0 (L) 3.5 - 5.0 g/dL   AST 36 15 - 41 U/L   ALT 129 (H) 0 - 44 U/L   Alkaline Phosphatase 274 (H) 38 - 126 U/L   Total Bilirubin 0.9 0.3 - 1.2 mg/dL   GFR, Estimated >60 >60 mL/min    Comment: (NOTE) Calculated using the CKD-EPI Creatinine Equation (2021)    Anion gap 7 5 - 15    Comment: Performed at [facility], [place] [address], [place], [place] [address]  Comprehensive metabolic panel     Status: Abnormal   Collection Time: 05/26/21  5:00 AM  Result Value Ref Range   Sodium 134 (L) 135 - 145 mmol/L   Potassium 4.3 3.5 - 5.1 mmol/L   Chloride 99 98 - 111 mmol/L   CO2 28 22 - 32 mmol/L   Glucose, Bld 113 (H) 70 - 99 mg/dL    Comment:  Glucose reference range applies only to samples taken after fasting for at least 8 hours.   BUN 17 8 - 23 mg/dL   Creatinine, Ser 0.85 0.61 - 1.24 mg/dL   Calcium 9.4 8.9 - 10.3 mg/dL   Total Protein 6.8 6.5 - 8.1 g/dL   Albumin 2.2 (L) 3.5 - 5.0 g/dL   AST 29 15 - 41 U/L   ALT 102 (H) 0 - 44 U/L   Alkaline Phosphatase 237 (H) 38 - 126 U/L   Total Bilirubin 0.8 0.3 - 1.2 mg/dL   GFR, Estimated >60 >60 mL/min    Comment: (NOTE) Calculated using the CKD-EPI Creatinine Equation (2021)    Anion gap 7 5 - 15    Comment: Performed at [facility], [place] [address], [place], [place] [address]  CBC     Status: Abnormal   Collection Time: 05/26/21  5:00 AM  Result Value Ref Range   WBC 14.3 (H) 4.0 - 10.5 K/uL   RBC 3.91 (L) 4.22 - 5.81 MIL/uL   Hemoglobin 12.2 (L) 13.0 - 17.0 g/dL   HCT 36.2 (L) 39.0 - 52.0 %   MCV 92.6 80.0 - 100.0 fL   MCH 31.2 26.0 - 34.0 pg   MCHC 33.7 30.0 - 36.0 g/dL   RDW 14.0 11.5 - 15.5 %   Platelets 404 (H) 150 - 400 K/uL   nRBC 0.0 0.0 - 0.2 %    Comment: Performed at [facility], [place] [address], [place], [place] [address]   No results found.     Medical Problem List and Plan: 1.  Debility secondary to pelvic abscess  -patient may shower but incision must be covered  -ELOS/Goals: 5-7 days modI/S  -Admit to CIR 2.  Antithrombotics: -DVT/anticoagulation:  Pharmaceutical: Other (comment) Eliquis started today.  -antiplatelet therapy: N/A 3. Post-operative pain: Will decrese tylenol to 325 mg tid for pain. Check LFTs tomorrow.  4. Mood: LCSW to follow for evaluation and support.   -antipsychotic agents: N/A 5. Neuropsych: This patient is capable of making decisions on his own behalf. 6. Skin/Wound Care: TID dressing changes to midline wound.   --Continue ensure TID to promote wound healing 7. Fluids/Electrolytes/Nutrition: Monitor I/O. Check labs in am 8. Cholecystitis: Monitor for rise in LFTs, rise in WBC or pain --Continue to monitor  LFTs twice a week for now --Decrease tylenol to 325 mg TID.  --Continue Meropenum X 2 weeks w/end date 09/05 9. Pelvic abscess a/p drain placement: Cultures +Kleb oxytoca--> Meropenum (S) thorough --monitor for SE/tolerance of antibiotics  10. Hyponatremia: Slowly improving. 11. Leucocytosis: WBC trending down from 26.5-->14.3  --monitor for fevers and other signs of infection.  12. Low calorie malnutrition: Alb-2.2. Will add juven additionally to help promote wound healing.  13.  Pre-diabetes: Hgb A1C-5.8. Fasting BS reasonable. Change Ensure to Ensure Max.  14. H/o  SVT/New onset AFib: Monitor HR TID--continue Diltiazem 120 mg TID with Eliquis bid  --off statin due to Abnormal LFTs.  15. Acute blood loss anemia: Will add MVI with iron supplement   I have personally performed a face to face diagnostic evaluation, including, but not limited to relevant history and physical exam findings, of this patient and developed relevant assessment and plan.  Additionally, I have reviewed and concur with the physician assistant's documentation above.  Izora Ribas, MD 05/26/2021   Bary Leriche, PA-C

## 2021-05-26 NOTE — Progress Notes (Signed)
Signed                                                                                                                                                                                                                                                                                                                                                                                                                                                                          PMR Admission Coordinator Pre-Admission Assessment   Patient: Gary Rice is an 87 y.o., male MRN: [id number] DOB: 1931-03-18 Height: 5' 11"  (180.3 cm) Weight: 78.4 kg   Insurance Information HMO:     PPO:      PCP:      IPA:      80/20: yes     OTHER:  PRIMARY: Medicare A & B      Policy#: [id number]      Subscriber: patient CM Name:       Phone#:      Fax#:  Pre-Cert#:       Employer:  Benefits:  Phone #: verified eligibility online via [place] on 05/25/21     Name:  Eff. Date: Part A effective 08/28/97 & Part B effective 09/28/97    Deduct: $1,556  Out of Pocket Max: NA Life Max: NA CIR: 100%      SNF: 100% days 1-20, 80% days 21-100 Outpatient: 80%     Co-Pay: 20% Home Health: 100%      Co-Pay:  DME: 80%     Co-Pay: 20% Providers: pt's choice SECONDARY: [employer]      Policy#: [id number]     Phone#: [phone]   Financial Counselor:       Phone#:    The "Data Collection Information Summary" for patients in Inpatient Rehabilitation Facilities with attached "Privacy Act [place] Records" was provided and verbally reviewed with: Patient   Emergency Contact Information Contact Information       Name Relation Home Work Mobile    [place] Spouse [phone]               Current Medical History  Patient Admitting Diagnosis: new onset A-fib with RVR and  Small Bowel Obstruction History of Present Illness: Pt is a 87 year old male with medical hx significant for: SVT, bilateral inguinal hernias, hyperlipidemia, HTN, previous hernia repair. Pt presented to ED on 05/02/21 with concern for possible SBO. Pt was vomiting day prior to ED presentation. PCP sent pt to ED. Pt noted to be tachycardic in ED. CTA chest negative for PE. CT showed R inguinal hernia with SBO. Pt had emergent open right inguinal hernia repair with mesh on 05/03/21. Pt developed a post-op ileus. Had NG tube placed. Repeat CT showed ongoing SBO in LLQ. Diagnostic laparoscopy and small bowel resection with primary anastomosis performed on 05/09/21. Pt found to have ischemic portion of small bowel with microperforation. Pt began TPN. Pt developed intra-abdominal infection.  CT  abdomen/pelvis on 8/16 noted large pelvic abscess. Pelvic abscess drain placed on 8/16 by IR. NG tube removed 8/18. TPN ended on 8/21. Repeat CT abdomen/pelvis on 8/22 showed gallbladder distention with multiple gallstones with gallbladder wall thickening, edema and pericholecystic standing. Also noted bile duct dilatation. Right upper quadrant ultrasound on 8/23 confirmed acute cholecystitis. Decision made to treat medically with antibiotics.  Therapy evaluations completed and note pt has a decline in functional mobility and ability to complete ADLS independently.     Patient's medical record from [facility] has been reviewed by the rehabilitation admission coordinator and physician.   Past Medical History      Past Medical History:  Diagnosis Date   Astigmatism     Basal cell carcinoma     Inguinal hernia      bilateral   Macular degeneration, age related, nonexudative      bilateral   SVT (supraventricular tachycardia) ([place])        Has the patient had major surgery during 100 days prior to admission? Yes   Family History   family history includes Heart attack in his father and son; Heart disease in his  father; Sleep apnea in his son.   Current Medications   Current Facility-Administered Medications:    acetaminophen (TYLENOL) tablet 650 mg, 650 mg, Oral, Q6H, Mariel Aloe, MD, 650 mg at 05/24/21 2223   Chlorhexidine Gluconate Cloth 2 % PADS 6 each, 6 each, Topical, Daily, Little Ishikawa, MD, 6 each at 05/26/21 0842   diltiazem (CARDIZEM) tablet 120 mg, 120 mg, Oral, Q8H, Mariel Aloe, MD, 120 mg at 05/26/21 1203   enoxaparin (LOVENOX) injection 40 mg, 40 mg, Subcutaneous, Q24H, Pauletta Browns, RPH, 40 mg at 05/26/21 0840   feeding supplement (ENSURE ENLIVE / ENSURE PLUS) liquid  237 mL, 237 mL, Oral, TID BM, Mariel Aloe, MD, 237 mL at 05/25/21 2115   lactated ringers infusion, , Intravenous, Continuous, Bertis Ruddy, [facility], Stopped at 05/18/21 5462   lip balm (CARMEX) ointment, , Topical, PRN, Little Ishikawa, MD, Given at 05/09/21 2249   meropenem (MERREM) 1 g in sodium chloride 0.9 % 100 mL IVPB, 1 g, Intravenous, Q8H, Manandhar, Collene Mares, MD, Last Rate: 200 mL/hr at 05/26/21 1207, 1 g at 05/26/21 1207   methocarbamol (ROBAXIN) 1,000 mg in dextrose 5 % 100 mL IVPB, 1,000 mg, Intravenous, Q8H PRN, Kabrich, Martha H, PA-C   metoprolol tartrate (LOPRESSOR) injection 5 mg, 5 mg, Intravenous, Q4H PRN, Winferd Humphrey, PA-C, 5 mg at 05/03/21 1334   morphine 2 MG/ML injection 1-2 mg, 1-2 mg, Intravenous, Q2H PRN, Winferd Humphrey, PA-C, 2 mg at 05/12/21 0204   ondansetron [facility]) injection 4 mg, 4 mg, Intravenous, Q6H PRN, Winferd Humphrey, PA-C, 4 mg at 05/08/21 2119   pantoprazole (PROTONIX) injection 40 mg, 40 mg, Intravenous, Q24H, Winferd Humphrey, PA-C, 40 mg at 05/26/21 0837   phenol (CHLORASEPTIC) mouth spray 1 spray, 1 spray, Mouth/Throat, PRN, Winferd Humphrey, PA-C, 1 spray at 05/03/21 1016   promethazine (PHENERGAN) 6.25 mg in sodium chloride 0.9 % 50 mL IVPB, 6.25 mg, Intravenous, Q6H PRN, Winferd Humphrey, PA-C, Last Rate: 70 mL/hr at 05/04/21 0230,  Infusion Verify at 05/04/21 0230   sodium chloride flush (NS) 0.9 % injection 10-40 mL, 10-40 mL, Intracatheter, Q12H, Little Ishikawa, MD, 10 mL at 05/26/21 0841   sodium chloride flush (NS) 0.9 % injection 10-40 mL, 10-40 mL, Intracatheter, PRN, Little Ishikawa, MD, 10 mL at 05/21/21 2132   sodium chloride flush (NS) 0.9 % injection 5 mL, 5 mL, Intracatheter, Q8H, Mir, Paula Libra, MD, 5 mL at 05/26/21 7035   Patients Current Diet:  Diet Order                  Diet - low sodium heart healthy             Diet - low sodium heart healthy             Diet regular Room service appropriate? Yes with Assist; Fluid consistency: Thin  Diet effective now                         Precautions / Restrictions Precautions Precautions: Fall Precaution Comments: JP drain R abdomen Restrictions Weight Bearing Restrictions: No    Has the patient had 2 or more falls or a fall with injury in the past year? No   Prior Activity Level Community (5-7x/wk): drives, out of house 3-4x/week   Prior Functional Level Self Care: Did the patient need help bathing, dressing, using the toilet or eating? Independent   Indoor Mobility: Did the patient need assistance with walking from room to room (with or without device)? Independent   Stairs: Did the patient need assistance with internal or external stairs (with or without device)? Independent   Functional Cognition: Did the patient need help planning regular tasks such as shopping or remembering to take medications? Independent   Patient Information Are you of Hispanic, Latino/a,or Spanish origin?: A. No, not of Hispanic, Latino/a, or Spanish origin What is your race?: A. White Do you need or want an interpreter to communicate with a doctor or health care staff?: 0. No   Patient's Response To:  Health Literacy and Transportation  Is the patient able to respond to health literacy and transportation needs?: Yes Health Literacy - How often do  you need to have someone help you when you read instructions, pamphlets, or other written material from your doctor or pharmacy?: Never In the past 12 months, has lack of transportation kept you from medical appointments or from getting medications?: No In the past 12 months, has lack of transportation kept you from meetings, work, or from getting things needed for daily living?: No   Development worker, international aid / [place] Devices/Equipment: None Home Equipment: Grab bars - tub/shower, Radio producer - single point   Prior Device Use: Indicate devices/aids used by the patient prior to current illness, exacerbation or injury?  cane   Current Functional Level Cognition   Overall Cognitive Status: Within Functional Limits for tasks assessed Orientation Level: Oriented X4 General Comments: Pt is self directed, wants to do things his way, however motivated and paticipatory    Extremity Assessment (includes Sensation/Coordination)   Upper Extremity Assessment: Generalized weakness, Overall WFL for tasks assessed  Lower Extremity Assessment: Defer to PT evaluation     ADLs   Overall ADL's : Needs assistance/impaired Eating/Feeding: Independent, Bed level, Sitting Grooming: Wash/dry hands, Wash/dry face, Min guard, Standing Grooming Details (indicate cue type and reason): completed at sink Upper Body Bathing: Set up, Sitting Lower Body Bathing: Minimal assistance, Sitting/lateral leans, Sit to/from stand Lower Body Bathing Details (indicate cue type and reason): Min A for throroughness due to safety and pt fatigue after ambulating in hallway Upper Body Dressing : Modified independent, Set up, Sitting (Sitting up at EOB (required RN assist to unconnect and reconnect IV while changing hospital gown).) Lower Body Dressing: Min guard, Sit to/from stand, Sitting/lateral leans Toilet Transfer: Min guard, Ambulation Toilet Transfer Details (indicate cue type and reason): Pt in hallway, ambulated  back to room and wanted to use BSC this session Toileting- Clothing Manipulation and Hygiene: Minimal assistance, Sitting/lateral lean, Sit to/from stand Toileting - Clothing Manipulation Details (indicate cue type and reason): Min A for thoroughness Tub/ Shower Transfer: Min guard, Ambulation Functional mobility during ADLs: Min guard, Rolling walker General ADL Comments: Pt was agreeable to bilateral UE strengthening HEP using level 3 theraband sitting up in bed. Pt reports that he sat up for "too long" yesterday and is waiting for lunch to arrive.     Mobility   Overal bed mobility: Needs Assistance Bed Mobility: Sit to Supine Rolling: Supervision Sidelying to sit: Supervision, HOB elevated Supine to sit: Min guard Sit to supine: Supervision General bed mobility comments: pt able to take self from sitting edge of bed to supine in bed with no rails, HOB flat. increased time/effort needed.     Transfers   Overall transfer level: Needs assistance Equipment used: Rolling walker (2 wheeled) Transfers: Sit to/from Stand Sit to Stand: Supervision, Min guard Stand pivot transfers: Min guard General transfer comment: supervision from chair with pt demo'ing good technique. min guard assist from low toilet with grab bar used and increased time/effort needed for full standing.     Ambulation / Gait / Stairs / Wheelchair Mobility   Ambulation/Gait Ambulation/Gait assistance: Counsellor (Feet): 120 Feet (with 2 brief standing rest breaks, then 10 feet from bathroom to bed) Assistive device: Rolling walker (2 wheeled) Gait Pattern/deviations: Step-through pattern, Decreased step length - right, Decreased step length - left, Decreased stride length, Trunk flexed General Gait Details: decreased gait speed with no balance issues noted. cues for posture at  times with gait. VSS with gait. Gait velocity: decreased Gait velocity interpretation: <1.31 ft/sec, indicative of household  ambulator Stairs:  (pt defers to attempt due to fatigue) Stairs assistance: Min guard Stair Management: Backwards, Forwards, With walker Number of Stairs: 1 (x6) General stair comments: able to step up x 6 to RW over single step leading with RLE,     Posture / Balance Balance Overall balance assessment: Needs assistance Sitting-balance support: Feet supported Sitting balance-Leahy Scale: Normal Standing balance support: Bilateral upper extremity supported, During functional activity Standing balance-Leahy Scale: Poor Standing balance comment: Reliant on B UE support for steadying, Supervision for static standing and up to min guard for dynamic (when pt performs his own peri-care unsupported, etc); pt able to don face mask with min guard; Attempted to encourage gait trial with cane as pt used this previously but pt not agreeable to attempt.     Special needs/care consideration Continuous Drip IV  lactated ringers infusion, Skin Catheter entry/exit: flank/right, lower; Ecchymosis: abdomen/right, left, lower; Rash: abdomen/bilateral, circumferential; Surgical incision: abdomen; Closed system drain: midline, and Designated visitor Gary Rice, wife    Previous Home Environment (from acute therapy documentation) Living Arrangements: Spouse/significant other  Lives With: Spouse Available Help at Discharge: Family, Available 24 hours/day Type of Home: House Home Layout: Two level, Other (Comment) (There is a bedroom and a bathroom on 1st floor) Alternate Level Stairs-Rails: Can reach both Alternate Level Stairs-Number of Steps: 17 steps Home Access: Stairs to enter Entrance Stairs-Rails: Right Entrance Stairs-Number of Steps: 11 steps in garage Bathroom Shower/Tub: Multimedia programmer: Standard Bathroom Accessibility: Yes How Accessible: Accessible via walker [place]: No Additional Comments: one fall recently- fell making a turn into the bathroom and hit hard on R hip  but no injuries besides bruising. only uses [facility] PRN   Discharge Living Setting Plans for Discharge Living Setting: Patient's home Type of Home at Discharge: House Discharge Home Layout: Two level, Other (Comment) (There is a bedroom and a bathroom on 1st floor) Alternate Level Stairs-Rails: Can reach both Alternate Level Stairs-Number of Steps: 17 Discharge Home Access: Stairs to enter Entrance Stairs-Rails: Right Entrance Stairs-Number of Steps: 11 steps in garage Discharge Bathroom Shower/Tub: Walk-in shower Discharge Bathroom Toilet: Standard Discharge Bathroom Accessibility: Yes How Accessible: Accessible via walker Does the patient have any problems obtaining your medications?: No   Social/Family/Support Systems Patient Roles: Spouse Anticipated Caregiver: Gary Rice, wife Anticipated Caregiver's Contact Information: [phone] Ability/Limitations of Caregiver: can provide supervision Caregiver Availability: 24/7 Discharge Plan Discussed with Primary Caregiver: Yes Is Caregiver In Agreement with Plan?: Yes Does Caregiver/Family have Issues with Lodging/Transportation while Pt is in Rehab?: Yes (wife does not drive)   Goals Patient/Family Goal for Rehab: Mod I: PT/OT Expected length of stay: 7-10 days Pt/Family Agrees to Admission and willing to participate: Yes Program Orientation Provided & Reviewed with Pt/Caregiver Including Roles  & Responsibilities: Yes   Decrease burden of Care through IP rehab admission: NA   Possible need for SNF placement upon discharge: Not anticipated   Patient Condition: I have reviewed medical records from [facility], spoken with CM, and patient and spouse. I met with patient at the bedside and discussed via phone for inpatient rehabilitation assessment.  Patient will benefit from ongoing PT and OT, can actively participate in 3 hours of therapy a day 5 days of the week, and can make measurable gains during the admission.  Patient  will also benefit from the coordinated team approach during an  Inpatient Acute Rehabilitation admission.  The patient will receive intensive therapy as well as Rehabilitation physician, nursing, social worker, and care management interventions.  Due to safety, skin/wound care, disease management, medication administration, pain management, and patient education the patient requires 24 hour a day rehabilitation nursing.  The patient is currently Supervision-Min G with mobility and Min G-Min A with basic ADLs.  Discharge setting and therapy post discharge at home with home health is anticipated.  Patient has agreed to participate in the Acute Inpatient Rehabilitation Program and will admit today.   Preadmission Screen Completed By:  Bethel Born, 05/26/2021 12:19 PM ______________________________________________________________________   Discussed status with Dr. Ranell Patrick on 05/26/21  at 12:19 PM and received approval for admission today.   Admission Coordinator:  Bethel Born, CCC-SLP, time 12:19 PM/Date 05/26/21     Assessment/Plan: Diagnosis: Pelvic abscess Does the need for close, 24 hr/day Medical supervision in concert with the patient's rehab needs make it unreasonable for this patient to be served in a less intensive setting? Yes Co-Morbidities requiring supervision/potential complications: Afib with RVR, SBO, leukocytosis, HLD, thoracic aortic aneurysm Due to bladder management, bowel management, safety, skin/wound care, disease management, medication administration, pain management, and patient education, does the patient require 24 hr/day rehab nursing? Yes Does the patient require coordinated care of a physician, rehab nurse, PT, OT to address physical and functional deficits in the context of the above medical diagnosis(es)? Yes Addressing deficits in the following areas: balance, endurance, locomotion, strength, transferring, bowel/bladder control, bathing, dressing,  feeding, grooming, toileting, and psychosocial support Can the patient actively participate in an intensive therapy program of at least 3 hrs of therapy 5 days a week? Yes The potential for patient to make measurable gains while on inpatient rehab is excellent Anticipated functional outcomes upon discharge from inpatient rehab: modified independent PT, modified independent OT, independent SLP Estimated rehab length of stay to reach the above functional goals is: 5-7 days Anticipated discharge destination: Home 10. Overall Rehab/Functional Prognosis: excellent     MD Signature: Leeroy Cha, MD

## 2021-05-26 NOTE — Progress Notes (Signed)
PHARMACY CONSULT NOTE FOR:  OUTPATIENT  PARENTERAL ANTIBIOTIC THERAPY (OPAT)  Indication: Acute cholecystitis/pelvic abscess Regimen: Meropenem 1 gm every 8 hours  End date: 06/02/21  IV antibiotic discharge orders are pended. To discharging provider:  please sign these orders via discharge navigator,  Select New Orders & click on the button choice - Manage This Unsigned Work.     Thank you for allowing pharmacy to be a part of this patient's care.  Jimmy Footman, PharmD, BCPS, [place] [facility] Clinical Pharmacist Phone: [phone] 05/26/2021, 10:28 AM

## 2021-05-26 NOTE — Progress Notes (Signed)
Inpatient Rehabilitation Medication Review by a Pharmacist  A complete drug regimen review was completed for this patient to identify any potential clinically significant medication issues.  High Risk Drug Classes Is patient taking? Indication by Medication  Antipsychotic Yes, as an intravenous medication Compazine- PRN nausea  Anticoagulant Yes Apixaban for afib  Antibiotic Yes, as an intravenous medication Meropenem for acute cholecystitis/pelvic abscess  Opioid No   Antiplatelet No   Hypoglycemics/insulin No   Vasoactive Medication No   Chemotherapy No   Other No      Type of Medication Issue Identified Description of Issue Recommendation(s)  Drug Interaction(s) (clinically significant)  N/A   Duplicate Therapy  N/A   Allergy  N/A   No Medication Administration End Date  N/A   Incorrect Dose  N/A   Additional Drug Therapy Needed  N/A   Significant med changes from prior encounter (inform family/care partners about these prior to discharge). N/A   Other  N/A     Clinically significant medication issues were identified that warrant physician communication and completion of prescribed/recommended actions by midnight of the next day:  No  Name of provider notified for urgent issues identified:   Provider Method of Notification:     Pharmacist comments:   Time spent performing this drug regimen review (minutes):  15   Ramond Craver 05/26/2021 6:46 PM

## 2021-05-26 NOTE — Discharge Summary (Signed)
Physician Discharge Summary  Gary Rice [facility] [id number] DOB: 1931-07-09 DOA: 05/02/2021  PCP: Josetta Huddle, MD  Admit date: 05/02/2021 Discharge date: 05/26/2021  Admitted From: Home Disposition:  CIR  Recommendations for Outpatient Follow-up:  Follow up with general surgery in 1-2 weeks to follow-up on his abscess and acute cholecystitis Please obtain BMP/CBC in one week Also follow-up with IR to remove drain from pelvic abscess. He will go to inpatient rehab on 14 days of IV meropenem.   Home Health:no Equipment/Devices:none  Discharge Condition:Stable CODE STATUS:fULL Diet recommendation: Heart Healthy  Brief/Interim Summary:  87 y.o. male past medical history of SVT, hyperlipidemia bilateral ankle hernias came into the hospital not feeling well found in new onset atrial fibrillation and  SBO. Status post surgical intervention on 05/03/2021 with mesh placement.  He had ongoing abdominal pain so CT scan of the abdomen pelvis was done that showed perforation, general surgery proceeded with diagnostic laparoscopy performed on 05/09/2021 and he is status post bowel resection with primary anastomosis. Was briefly on TPN until 05/18/2021. During his hospitalization he did develop intra-abdominal infection with a CT scan of the abdomen and pelvis showing a large pelvic abscess, IR was consulted who placed a drain on 05/13/2021.  Repeated CT scan of the abdomen showed decreasing size and pelvic abscess but no change in smaller collections.      Discharge Diagnoses:  Principal Problem:   Pelvic abscess in male [facility]) Active Problems:   Atrial fibrillation with rapid ventricular response (HCC)   SBO (small bowel obstruction) (HCC)   Leukocytosis   Hyperlipidemia   Thoracic aortic aneurysm (HCC)   Malnutrition of moderate degree  Incarcerated right inguinal hernia with SBO: Status post surgical intervention on 05/03/2021 with mesh placement. He did not had a diagnostic laparoscopy performed  on 05/09/2021 secondary to ongoing left lower quadrant abdominal pain that showed perforation status post bowel resection with primary anastomosis. He was briefly on TPN until 05/18/2021 his diet was advanced which he tolerated. With drain in place placed by IR continue further recommendations per IR. Physical therapy evaluated the patient and recommended inpatient rehab.  Acute cholecystitis: General surgery on board and recommended conservative management for acute cholecystitis. MRCP show acute cholecystitis without choledocholithiasis. LFTs are low back to normal limits. Tolerating his diet having regular bowel movements and I do see abdominal pain. He was initially started on IV meropenem, will continue IV meropenem for total 14 days. Inpatient rehab has evaluated the patient and relates he is a good candidate. His albumin is trending up.   Intra-abdominal abscess: CT scan of the abdomen pelvis on 05/17/2021 showed a pelvic abscess IR was consulted who performed CT-guided drain placement on 05/13/2021. Wound culture grew Bacteroides and MDR Klebsiella oxytoca. A CT scan showed a decrease in size of pelvic abscess he was treated with IV antibiotics with IV meropenem which should cover the pelvic abscess.  Continues IV meropenem for 14 days for his acute cholecystitis.   Intractable nausea and vomiting: Required NG tube placement temporarily. Now resolved.  New onset atrial fibrillation with RVR: Cardiology was consulted and he was treated with IV Cardizem and metoprolol transesophageal echo showed preserved EF. He is currently rate controlled on diltiazem, we have restarted his oral Eliquis.   Dyspnea: Improved with position and incentive spirometry. Chest x-ray unremarkable. Ambulated Feb 15, 2021 dyspnea on exertion but saturations remain above 90.   Hypovolemic hyponatremia: Resolved with IV fluid hydration.  Hyperlipidemia: Continue statins.  Ascending aortic  aneurysm: Continue follow-up  with CT surgery as an outpatient.  Leukocytosis: Continues to have persistent leukocytosis likely due to acute cholecystitis   Maculopapular rash: Question of medication induced he was treated with oral Benadryl.    Discharge Instructions  Discharge Instructions     Diet - low sodium heart healthy   Complete by: As directed    Increase activity slowly   Complete by: As directed    No wound care   Complete by: As directed       Allergies as of 05/26/2021   No Known Allergies      Medication List     TAKE these medications    amoxicillin-clavulanate 875-125 MG tablet Commonly known as: AUGMENTIN Take 1 tablet by mouth every 12 (twelve) hours for 5 days.   apixaban 5 MG Tabs tablet Commonly known as: Eliquis Take 2 tablets ('10mg'$ ) twice daily for 7 days, then 1 tablet ('5mg'$ ) twice daily   aspirin EC 81 MG tablet Take 81 mg by mouth at bedtime.   atorvastatin 10 MG tablet Commonly known as: LIPITOR Take 10 mg by mouth at bedtime.   diltiazem 30 MG tablet Commonly known as: CARDIZEM TAKE 1 TABLET AS NEEDED FOR RAPID HEART BEAT What changed: See the new instructions.   fluticasone 50 MCG/ACT nasal spray Commonly known as: FLONASE Place 1 spray into both nostrils daily.   multivitamin with minerals Tabs tablet Take 1 tablet by mouth daily.   PreserVision AREDS 2 Caps Take 1 capsule 2 (two) times daily by mouth.   VITAMIN E PO Take 1 capsule by mouth daily.        Follow-up Information     Rolm Bookbinder, MD Follow up on 06/26/2021.   Specialty: General Surgery Why: arrive at 1:00pm for a 1:30pm appointment time Contact information: [address] [place] [address] [phone]                No Known Allergies  Consultations: General surgery IR Infectious disease    Procedures/Studies: CT ABDOMEN PELVIS WO CONTRAST  Result Date: 05/05/2021 CLINICAL DATA:  Bowel obstruction suspected  EXAM: CT ABDOMEN AND PELVIS WITHOUT CONTRAST TECHNIQUE: Multidetector CT imaging of the abdomen and pelvis was performed following the standard protocol without IV contrast. COMPARISON:  CT 05/02/2021 FINDINGS: Lower chest: Partially visualized ascending aorta measures 4.4 cm, this measured up to 4.8 cm on prior dedicated CTA of the chest. Small right and trace left pleural effusions with adjacent right basilar atelectasis. Coronary artery calcifications. Hepatobiliary: Atrophic left hepatic lobe. Layering high density in the gallbladder, likely hyperdense sludge and small stones. Pancreas: Unremarkable. No pancreatic ductal dilatation or surrounding inflammatory changes. Spleen: Normal in size without focal abnormality. Adrenals/Urinary Tract: Adrenal glands are unremarkable. No hydronephrosis. Unchanged left renal cysts. Unchanged right renal cyst. No nephrolithiasis. Unchanged bladder dome diverticulum. Stomach/Bowel: There is a nasogastric tube with tip in the stomach. The stomach is unremarkable. There are multiple dilated loops of small bowel, with transition point in the left lower quadrant anteriorly (series 3, image 58 and series 6, image 36). There is a large rectal stool ball. Vascular/Lymphatic: Aortoiliac atherosclerotic calcifications. No AAA. Reproductive: Enlarged prostate. Other: There is a left inguinal hernia containing small bowel. Postsurgical changes of recent open right inguinal hernia repair. There is no evidence of residual bowel in the right inguinal canal. There are postsurgical inflammatory changes in this region and small foci of gas. Musculoskeletal: Prior right hip arthroplasty. No acute osseous abnormality. Dextroconvex curvature of the lumbar  spine. Multilevel degenerative changes of the spine, no acute osseous abnormality. No suspicious lytic or blastic lesions. IMPRESSION: Persistent small bowel obstruction, with transition point in the left lower quadrant anteriorly, possibly due  to adhesions. Postsurgical changes of recent open right inguinal hernia repair without recurrence. Unchanged small bowel containing left inguinal hernia, which appears unrelated to the current bowel obstruction. Unchanged small right and trace left pleural effusions. Unchanged layering high density in the gallbladder, likely hyperdense sludge and small stones. Electronically Signed   By: Maurine Simmering   On: 05/05/2021 16:17   CT Angio Chest PE W and/or Wo Contrast  Result Date: 05/02/2021 CLINICAL DATA:  Blockage unable to tolerate p.o. EXAM: CT ANGIOGRAPHY CHEST CT ABDOMEN AND PELVIS WITH CONTRAST TECHNIQUE: Multidetector CT imaging of the chest was performed using the standard protocol during bolus administration of intravenous contrast. Multiplanar CT image reconstructions and MIPs were obtained to evaluate the vascular anatomy. Multidetector CT imaging of the abdomen and pelvis was performed using the standard protocol during bolus administration of intravenous contrast. CONTRAST:  176m OMNIPAQUE IOHEXOL 350 MG/ML SOLN COMPARISON:  Radiography 05/02/2021 FINDINGS: CTA CHEST FINDINGS Cardiovascular: Satisfactory opacification of the pulmonary arteries to the segmental level. No evidence of pulmonary embolism. Mild aortic atherosclerosis. Aneurysmal dilatation of the ascending aorta up to 4.8 cm. Normal cardiac size. Mild coronary vascular calcification. No pericardial effusion Mediastinum/Nodes: No enlarged mediastinal, hilar, or axillary lymph nodes. Thyroid gland, trachea, and esophagus demonstrate no significant findings. Lungs/Pleura: Lungs are clear. No pleural effusion or pneumothorax. Musculoskeletal: No chest wall abnormality. No acute or significant osseous findings. Review of the MIP images confirms the above findings. CT ABDOMEN and PELVIS FINDINGS Hepatobiliary: Hyperdense sludge or excreted contrast within the gallbladder. No biliary dilatation. Subcentimeter hypodensities in the liver too small to  further characterize. Small cyst in the left hepatic lobe. Pancreas: Unremarkable. No pancreatic ductal dilatation or surrounding inflammatory changes. Spleen: Normal in size without focal abnormality. Adrenals/Urinary Tract: Adrenal glands are normal. Bilateral renal cysts. 9.4 cm cyst in the upper pole left kidney. The bladder is unremarkable Stomach/Bowel: Fluid distension of the stomach, proximal and mid small bowel consistent with obstruction. Transition point related to right inguinal hernia containing fat and short segment of small bowel. Small bowel distal to the hernia is decompressed. No acute bowel wall thickening. Vascular/Lymphatic: Moderate aortic atherosclerosis. No aneurysm. No suspicious nodes Reproductive: Enlarged prostate Other: Moderate left inguinal hernia containing fat and decompressed small bowel. Musculoskeletal: Orthopedic hardware right hip with artifact. No acute osseous abnormality Review of the MIP images confirms the above findings. IMPRESSION: 1. Negative for acute pulmonary embolus.  Clear lung fields. 2. Findings consistent with mechanical small bowel obstruction, transition point related to right inguinal hernia. No perforation. 3. Moderate fat and small bowel containing left inguinal hernia without adverse features 4. Aneurysmal dilatation of the ascending aorta up to 4.8 cm. Ascending thoracic aortic aneurysm. Recommend semi-annual imaging followup by CTA or MRA and referral to cardiothoracic surgery if not already obtained. This recommendation follows 2010 ACCF/AHA/AATS/ACR/ASA/SCA/SCAI/SIR/STS/SVM Guidelines for the Diagnosis and Management of Patients With Thoracic Aortic Disease. Circulation. 2010; 121:[id number] Aortic aneurysm NOS (ICD10-I71.9) Electronically Signed   By: KDonavan FoilM.D.   On: 05/02/2021 19:56   CT ABDOMEN PELVIS W CONTRAST  Result Date: 05/19/2021 CLINICAL DATA:  Acute nonlocalized abdominal pain. Postop day 17 after open right inguinal hernia  repair with mesh. EXAM: CT ABDOMEN AND PELVIS WITH CONTRAST TECHNIQUE: Multidetector CT imaging of the abdomen and pelvis was  performed using the standard protocol following bolus administration of intravenous contrast. CONTRAST:  141m OMNIPAQUE IOHEXOL 300 MG/ML  SOLN COMPARISON:  05/13/2021 FINDINGS: Lower chest: Small right pleural effusion with basilar atelectasis, similar to prior study. Hepatobiliary: No focal liver lesions demonstrated. The gallbladder is distended with multiple stones. Gallbladder wall thickening and edema with pericholecystic stranding likely indicates acute cholecystitis. Intra and extrahepatic bile duct dilatation is present. Pancreas: Unremarkable. No pancreatic ductal dilatation or surrounding inflammatory changes. Spleen: Normal in size without focal abnormality. Adrenals/Urinary Tract: No adrenal gland nodules. Large cyst in the upper pole left kidney. Nephrograms are otherwise homogeneous and symmetrical. No hydronephrosis or hydroureter. Bladder is unremarkable. Stomach/Bowel: The stomach, small bowel, and colon are not abnormally distended. Scattered stool throughout the colon. Left lower quadrant bowel anastomosis again demonstrated with mild hazy infiltration in the adjacent mesenteric fat. Small collection adjacent to the anastomosis measures 2.3 cm. This collection is decreased in size since the previous study and the inflammatory infiltration is decreased. Loculated collection in the pelvis anterior to the rectum is again demonstrated although significantly decreased in size since prior study, today measuring 3.4 x 6.6 cm. There has been interval placement of a pigtail drainage catheter into this collection via trans sciatic approach. The catheter pigtail appears to be centrally positioned within the residual collection. Additional small collection adjacent to the descending colon measuring 1.8 cm diameter is unchanged. No new collections are identified. Left inguinal hernia  containing small bowel without proximal obstruction. No change. Vascular/Lymphatic: Aortic atherosclerosis. No enlarged abdominal or pelvic lymph nodes. Reproductive: Prostate gland is enlarged. Other: Small amount of free fluid in the abdomen, likely reactive. No free air identified. Anterior abdominal wall subcutaneous defect is likely postoperative and unchanged. Subcutaneous edema in the pelvis. Musculoskeletal: Lumbar scoliosis convex towards the right with diffuse degenerative change. Right hip arthroplasty. IMPRESSION: 1. Small right pleural effusion with basilar atelectasis. 2. Gallbladder distention. Multiple gallstones with gallbladder wall thickening and edema and pericholecystic stranding. Bile duct dilatation. Changes may indicate acute cholecystitis. 3. Pigtail drainage catheter placed in a pelvic collection. Significant interval decrease in the size of this collection with additional smaller collections unchanged. Decreased infiltrative change in the mesentery. 4. Left inguinal hernia containing small bowel without proximal obstruction, unchanged. 5. Aortic atherosclerosis. 6. Enlarged prostate gland. Electronically Signed   By: WLucienne CapersM.D.   On: 05/19/2021 18:42   CT ABDOMEN PELVIS W CONTRAST  Result Date: 05/13/2021 CLINICAL DATA:  History of right inguinal hernia repair and small-bowel resection. Leukocytosis. EXAM: CT ABDOMEN AND PELVIS WITH CONTRAST TECHNIQUE: Multidetector CT imaging of the abdomen and pelvis was performed using the standard protocol following bolus administration of intravenous contrast. CONTRAST:  782mOMNIPAQUE IOHEXOL 350 MG/ML SOLN COMPARISON:  CT scan 05/05/2021 FINDINGS: Lower chest: Small bilateral pleural effusions, right larger than left with overlying atelectasis. No definite infiltrates or pneumothorax. Stable 4 mm left lower lobe pulmonary nodule. The heart is normal in size. No pericardial effusion. There is an NG tube coursing down the esophagus and  into the stomach. Stable aortic and coronary artery calcifications. Hepatobiliary: A few small scattered hepatic cysts are again noted. No worrisome hepatic lesions or intrahepatic biliary dilatation. Gallbladder demonstrates layering gallstones but no findings for acute cholecystitis. No common bile duct dilatation. Pancreas: No mass, inflammation or ductal dilatation. Spleen: Normal size.  No focal lesions. Adrenals/Urinary Tract: The adrenal glands unremarkable stable. Stable renal cysts. No worrisome renal lesions or hydronephrosis. There is gas in the bladder are likely  from recent catheterization. Stomach/Bowel: The stomach is unremarkable. It contains an NG tube. The duodenum is unremarkable. Slightly distended small bowel loops but no obstructive findings. Contrast gets all the way to the colon. Postoperative changes in the left lower quadrant with anastomosis sutures. There is surrounding small enhancing fluid collections and significant inflammatory type changes. There is also a small amount of free air and. There is a slightly larger interloop rim enhancing fluid collection measuring 3 cm on image number 58/5 which contains a small amount of gas. This is likely a small abscess. There is a large rim enhancing fluid collection in the pelvis measuring approximately 12 x 11 x 10 cm and is located mainly between the bladder and the rectum. I do not see any gas in this fluid collection but it is consistent with a pelvic abscess. Vascular/Lymphatic: Stable vascular calcifications. Scattered mesenteric and retroperitoneal lymph nodes but no mass or overt adenopathy the. Reproductive: The prostate gland and seminal vesicles are grossly normal. Other: Surgical changes from recent right inguinal hernia repair with small amount fluid and gas. The large left inguinal hernia is stable. It contains small bowel loops but no findings for incarceration or obstruction. Musculoskeletal: Stable scoliosis and advanced  degenerative lumbar spondylosis. No acute bony findings. IMPRESSION: 1. Postoperative changes in the left lower quadrant from recent small bowel resection with anastomosis sutures. Small surrounding rim enhancing fluid collections and marked inflammatory changes consistent with small abscesses. No leaking oral contrast is demonstrated. 2. Large rim enhancing fluid collection in the pelvis measuring approximately 12 x 11 x 10 cm consistent with a pelvic abscess. 3. Small bilateral pleural effusions, right larger than left with overlying atelectasis. 4. Cholelithiasis. 5. Stable large left inguinal hernia containing small bowel loops but no findings for incarceration or obstruction. 6. Stable 4 mm left lower lobe pulmonary nodule. These results will be called to the ordering clinician or representative by the Radiologist Assistant, and communication documented in the PACS or [employer]. Aortic Atherosclerosis (ICD10-I70.0). Electronically Signed   By: Marijo Sanes M.D.   On: 05/13/2021 14:04   CT ABDOMEN PELVIS W CONTRAST  Result Date: 05/02/2021 CLINICAL DATA:  Blockage unable to tolerate p.o. EXAM: CT ANGIOGRAPHY CHEST CT ABDOMEN AND PELVIS WITH CONTRAST TECHNIQUE: Multidetector CT imaging of the chest was performed using the standard protocol during bolus administration of intravenous contrast. Multiplanar CT image reconstructions and MIPs were obtained to evaluate the vascular anatomy. Multidetector CT imaging of the abdomen and pelvis was performed using the standard protocol during bolus administration of intravenous contrast. CONTRAST:  127m OMNIPAQUE IOHEXOL 350 MG/ML SOLN COMPARISON:  Radiography 05/02/2021 FINDINGS: CTA CHEST FINDINGS Cardiovascular: Satisfactory opacification of the pulmonary arteries to the segmental level. No evidence of pulmonary embolism. Mild aortic atherosclerosis. Aneurysmal dilatation of the ascending aorta up to 4.8 cm. Normal cardiac size. Mild coronary vascular  calcification. No pericardial effusion Mediastinum/Nodes: No enlarged mediastinal, hilar, or axillary lymph nodes. Thyroid gland, trachea, and esophagus demonstrate no significant findings. Lungs/Pleura: Lungs are clear. No pleural effusion or pneumothorax. Musculoskeletal: No chest wall abnormality. No acute or significant osseous findings. Review of the MIP images confirms the above findings. CT ABDOMEN and PELVIS FINDINGS Hepatobiliary: Hyperdense sludge or excreted contrast within the gallbladder. No biliary dilatation. Subcentimeter hypodensities in the liver too small to further characterize. Small cyst in the left hepatic lobe. Pancreas: Unremarkable. No pancreatic ductal dilatation or surrounding inflammatory changes. Spleen: Normal in size without focal abnormality. Adrenals/Urinary Tract: Adrenal glands are normal. Bilateral renal  cysts. 9.4 cm cyst in the upper pole left kidney. The bladder is unremarkable Stomach/Bowel: Fluid distension of the stomach, proximal and mid small bowel consistent with obstruction. Transition point related to right inguinal hernia containing fat and short segment of small bowel. Small bowel distal to the hernia is decompressed. No acute bowel wall thickening. Vascular/Lymphatic: Moderate aortic atherosclerosis. No aneurysm. No suspicious nodes Reproductive: Enlarged prostate Other: Moderate left inguinal hernia containing fat and decompressed small bowel. Musculoskeletal: Orthopedic hardware right hip with artifact. No acute osseous abnormality Review of the MIP images confirms the above findings. IMPRESSION: 1. Negative for acute pulmonary embolus.  Clear lung fields. 2. Findings consistent with mechanical small bowel obstruction, transition point related to right inguinal hernia. No perforation. 3. Moderate fat and small bowel containing left inguinal hernia without adverse features 4. Aneurysmal dilatation of the ascending aorta up to 4.8 cm. Ascending thoracic aortic  aneurysm. Recommend semi-annual imaging followup by CTA or MRA and referral to cardiothoracic surgery if not already obtained. This recommendation follows 2010 ACCF/AHA/AATS/ACR/ASA/SCA/SCAI/SIR/STS/SVM Guidelines for the Diagnosis and Management of Patients With Thoracic Aortic Disease. Circulation. 2010; [id number]. Aortic aneurysm NOS (ICD10-I71.9) Electronically Signed   By: Donavan Foil M.D.   On: 05/02/2021 19:56   DG CHEST PORT 1 VIEW  Result Date: 05/17/2021 CLINICAL DATA:  Dyspnea. EXAM: PORTABLE CHEST 1 VIEW COMPARISON:  05/15/2021 and older studies. FINDINGS: Cardiac silhouette normal in size.  No mediastinal or hilar masses. Linear atelectasis noted at the left lung base, stable. Lungs otherwise clear. No convincing pleural effusion and no pneumothorax. Right PICC is stable, tip in the mid superior vena cava. IMPRESSION: 1. No acute cardiopulmonary disease. 2. Status post removal of the nasal/orogastric tube since the previous exam. Electronically Signed   By: Lajean Manes M.D.   On: 05/17/2021 12:13   DG CHEST PORT 1 VIEW  Result Date: 05/15/2021 CLINICAL DATA:  PICC line complication. EXAM: PORTABLE CHEST 1 VIEW COMPARISON:  Chest x-ray dated May 11, 2021. FINDINGS: Slight interval retraction in the tip of the right upper extremity PICC line, though it remains within the mid SVC. Unchanged enteric tube. Stable cardiomediastinal silhouette. No focal consolidation, pleural effusion, or pneumothorax. No acute osseous abnormality. IMPRESSION: 1. Slight interval retraction of the right upper extremity PICC line, though it remains within the mid SVC. 2. No active disease. Electronically Signed   By: Titus Dubin M.D.   On: 05/15/2021 08:58   DG CHEST PORT 1 VIEW  Result Date: 05/11/2021 CLINICAL DATA:  Status post PICC line placement. EXAM: PORTABLE CHEST 1 VIEW COMPARISON:  May 03, 2021 FINDINGS: Sh right PICC line terminates in the central SVC. A prominent tortuous thoracic aorta  is stable. The heart, hila, and mediastinum are otherwise unremarkable. No pneumothorax. The lungs are clear. IMPRESSION: 1. The right PICC line terminates in the central SVC. 2. Tortuous prominent thoracic aorta, unchanged. 3. The NG tube terminates in the stomach. 4. No other abnormalities. Electronically Signed   By: Dorise Bullion III M.D.   On: 05/11/2021 17:29   DG CHEST PORT 1 VIEW  Result Date: 05/03/2021 CLINICAL DATA:  NG tube placement EXAM: PORTABLE CHEST 1 VIEW COMPARISON:  05/02/2021 FINDINGS: NG tube tip is in the fundus of the stomach. No confluent airspace opacities. Heart is normal size. No effusions. IMPRESSION: NG tube tip in the fundus of the stomach. No acute cardiopulmonary disease. Electronically Signed   By: Rolm Baptise M.D.   On: 05/03/2021 18:38   DG Chest  Port 1 View  Result Date: 05/02/2021 CLINICAL DATA:  Shortness of breath EXAM: PORTABLE CHEST 1 VIEW COMPARISON:  08/04/2007 FINDINGS: Heart is normal size. Tortuous, ectatic thoracic aorta. Lungs clear. No effusions. No acute bony abnormality. IMPRESSION: No acute cardiopulmonary disease. Electronically Signed   By: Rolm Baptise M.D.   On: 05/02/2021 18:25   DG Abd Portable 1V  Result Date: 05/12/2021 CLINICAL DATA:  Enteric tube placement. EXAM: PORTABLE ABDOMEN - 1 VIEW COMPARISON:  Abdominal x-ray dated May 07, 2021. FINDINGS: Unchanged enteric tube within the stomach. Mildly improved diffuse small bowel dilatation. Oral contrast again noted within the colon. IMPRESSION: 1. Unchanged enteric tube within the stomach. 2. Mildly improved ileus. Electronically Signed   By: Titus Dubin M.D.   On: 05/12/2021 08:53   DG Abd Portable 1V  Result Date: 05/07/2021 CLINICAL DATA:  Obstruction EXAM: PORTABLE ABDOMEN - 1 VIEW COMPARISON:  Portable exam 0525 hours compared to 05/06/2021 FINDINGS: Nasogastric tube projects over proximal stomach. Retained contrast in decompressed RIGHT colon. Numerous air-filled distended loops  of small bowel throughout abdomen consistent with persistent small-bowel obstruction. Some gas is present in the distal sigmoid colon and rectum. No bowel wall thickening. Degenerative disc disease changes and dextroconvex scoliosis lumbar spine. Osseous demineralization and RIGHT hip prosthesis noted. IMPRESSION: Persistent small bowel obstruction Electronically Signed   By: Lavonia Dana M.D.   On: 05/07/2021 08:18   DG Abd Portable 1V  Result Date: 05/06/2021 CLINICAL DATA:  Small-bowel obstruction EXAM: PORTABLE ABDOMEN - 1 VIEW COMPARISON:  Portable exam 0844 hours compared to 05/05/2021 FINDINGS: Air-filled distended small bowel loops in the abdomen consistent with small bowel obstruction. Loops are in general little changed in diameter versus previous exam. Small amount retained contrast in RIGHT colon. No definite bowel wall thickening. Bones demineralized with degenerative changes and scoliosis of lumbar spine as well as note of a RIGHT hip prosthesis. IMPRESSION: Persistent small bowel obstruction. Electronically Signed   By: Lavonia Dana M.D.   On: 05/06/2021 10:26   DG Abd Portable 1V  Result Date: 05/05/2021 CLINICAL DATA:  87 year old male with right inguinal hernia related SBO. EXAM: PORTABLE ABDOMEN - 1 VIEW COMPARISON:  CT Abdomen and Pelvis 05/02/2021.  KUB 05/03/2021. FINDINGS: Portable AP supine view at 0912 hours. Enteric tube placed, side hole at the level of the GE J. Decompressed stomach compared to 05/03/2021. But small-bowel obstruction gas pattern persists. Gas-filled small bowel loops continue to measure up to 40 mm in the central abdomen. Paucity of large bowel gas as before. Stable visualized osseous structures. IMPRESSION: 1. Enteric tube placed, side hole at the level of the GEJ. Advance 5 cm to ensure side hole placement within the stomach. 2. Continued small bowel obstruction gas pattern, not improved since 05/02/2021. Electronically Signed   By: Genevie Ann M.D.   On: 05/05/2021  10:00   DG Abd Portable 1 View  Result Date: 05/03/2021 CLINICAL DATA:  NG tube placement EXAM: PORTABLE ABDOMEN - 1 VIEW COMPARISON:  05/02/2021 FINDINGS: NG tube tip is in the distal esophagus. Dilated small bowel loops again seen in the abdomen. IMPRESSION: NG tube tip in the distal esophagus. Electronically Signed   By: Rolm Baptise M.D.   On: 05/03/2021 00:45   DG Abd Portable 1 View  Result Date: 05/02/2021 CLINICAL DATA:  Shortness of breath, abdominal pain EXAM: PORTABLE ABDOMEN - 1 VIEW COMPARISON:  None. FINDINGS: Nonspecific bowel gas pattern. Gas throughout nondistended large and small bowel. No evidence of bowel obstruction.  No organomegaly, suspicious calcification or free air. Visualized lung bases clear. IMPRESSION: No evidence of bowel obstruction or free air. Electronically Signed   By: Rolm Baptise M.D.   On: 05/02/2021 18:26   MR ABDOMEN MRCP W WO CONTAST  Result Date: 05/21/2021 CLINICAL DATA:  Inpatient. Hyperbilirubinemia. Acute cholecystitis and mild biliary ductal dilatation on sonogram. Abdominal pain. Recent right inguinal hernia mesh repair. Pelvic abscess status post percutaneous drainage. EXAM: MRI ABDOMEN WITHOUT AND WITH CONTRAST (INCLUDING MRCP) TECHNIQUE: Multiplanar multisequence MR imaging of the abdomen was performed both before and after the administration of intravenous contrast. Heavily T2-weighted images of the biliary and pancreatic ducts were obtained, and three-dimensional MRCP images were rendered by post processing. CONTRAST:  7.25m GADAVIST GADOBUTROL 1 MMOL/ML IV SOLN COMPARISON:  05/19/2021 CT abdomen/pelvis and 05/20/2021 abdominal sonogram. FINDINGS: Severely motion degraded scan, limiting assessment. Lower chest: Small dependent right pleural effusion. Hepatobiliary: Relative atrophy of the left liver lobe, particularly the lateral segment left liver. Normal configuration of the right liver and caudate lobe. No hepatic steatosis. Scattered simple  subcentimeter liver cysts. Numerous layering subcentimeter gallstones and layering sludge in the distended gallbladder with moderate diffuse gallbladder wall thickening. Small amount of pericholecystic fluid. No significant intrahepatic biliary ductal dilatation. The intrahepatic bile ducts in the atrophic lateral segment left liver lobe are irregular and beaded in contour on the MRCP sequence (series 8/image 121). Top-normal caliber common bile duct with diameter 6 mm. No evidence of a centrally obstructing left liver mass. No discrete biliary filling defects to suggest choledocholithiasis. Pancreas: No pancreatic mass or duct dilation.  No pancreas divisum. Spleen: Normal size. No mass. Adrenals/Urinary Tract: Normal adrenals. No hydronephrosis. Simple bilateral renal cysts, largest 9.7 cm in the posterior upper left kidney. No suspicious renal masses. Stomach/Bowel: Normal non-distended stomach. Visualized small and large bowel is normal caliber, with no bowel wall thickening. Vascular/Lymphatic: Atherosclerotic nonaneurysmal abdominal aorta. Patent portal, splenic, hepatic and renal veins. Retroaortic left renal vein. No pathologically enlarged lymph nodes in the abdomen. Other: No abdominal ascites or focal fluid collection. Musculoskeletal: No aggressive appearing focal osseous lesions. Moderate dextrocurvature of the lumbar spine with associated advanced degenerative disc disease. IMPRESSION: 1. Limited motion degraded scan. 2. Cholelithiasis, layering sludge, gallbladder distension, moderate diffuse gallbladder wall thickening and small amount of pericholecystic fluid. Findings are compatible with acute cholecystitis. 3. Top-normal caliber common bile duct measuring 6 mm. No evidence of choledocholithiasis. No significant intrahepatic biliary ductal dilatation. 4. Relative atrophy of the lateral segment left liver lobe with associated irregular and beaded contour of the intrahepatic bile ducts in the lateral  segment left liver lobe. No evidence of a centrally obstructing left liver mass. 5. Small dependent right pleural effusion. Electronically Signed   By: JIlona SorrelM.D.   On: 05/21/2021 21:29   ECHOCARDIOGRAM COMPLETE  Result Date: 05/04/2021    ECHOCARDIOGRAM REPORT   Patient Name:   JDAYMIEN CUTCHINDate of Exam: 05/04/2021 Medical Rec #:  [id number]         Height:       71.0 in Accession #:    [id number]        Weight:       170.0 lb Date of Birth:  105-05-1931          BSA:          1.968 m Patient Age:    [age]years           BP:  106/68 mmHg Patient Gender: M                  HR:           83 bpm. Exam Location:  Inpatient Procedure: 2D Echo, Cardiac Doppler, Color Doppler and 3D Echo Indications:    I48.91* Unspeicified atrial fibrillation  History:        Patient has prior history of Echocardiogram examinations, most                 recent 08/16/2017.  Sonographer:    Merrie Roof RDCS Referring Phys: [id number] [facility]  Sonographer Comments: Technically difficult study due to poor echo windows. IMPRESSIONS  1. Left ventricular ejection fraction, by estimation, is 60 to 65%. The left ventricle has normal function. The left ventricle has no regional wall motion abnormalities. There is mild left ventricular hypertrophy. Left ventricular diastolic parameters are indeterminate.  2. Right ventricular systolic function is normal. The right ventricular size is normal. Tricuspid regurgitation signal is inadequate for assessing PA pressure.  3. The mitral valve is degenerative. Trivial mitral valve regurgitation. No evidence of mitral stenosis.  4. The aortic valve is grossly normal. There is mild thickening of the aortic valve. Aortic valve regurgitation is trivial. No aortic stenosis is present.  5. The inferior vena cava is dilated in size with >50% respiratory variability, suggesting right atrial pressure of 8 mmHg. FINDINGS  Left Ventricle: Left ventricular ejection fraction, by estimation, is  60 to 65%. The left ventricle has normal function. The left ventricle has no regional wall motion abnormalities. 3D left ventricular ejection fraction analysis performed but not reported based on interpreter judgement due to suboptimal quality. The left ventricular internal cavity size was normal in size. There is mild left ventricular hypertrophy. Left ventricular diastolic parameters are indeterminate. Right Ventricle: The right ventricular size is normal. No increase in right ventricular wall thickness. Right ventricular systolic function is normal. Tricuspid regurgitation signal is inadequate for assessing PA pressure. Left Atrium: Left atrial size was normal in size. Right Atrium: Right atrial size was normal in size. Pericardium: There is no evidence of pericardial effusion. Mitral Valve: The mitral valve is degenerative in appearance. Mild mitral annular calcification. Trivial mitral valve regurgitation. No evidence of mitral valve stenosis. Tricuspid Valve: The tricuspid valve is normal in structure. Tricuspid valve regurgitation is mild . No evidence of tricuspid stenosis. Aortic Valve: The aortic valve is grossly normal. There is mild thickening of the aortic valve. Aortic valve regurgitation is trivial. No aortic stenosis is present. Aortic valve mean gradient measures 4.0 mmHg. Aortic valve peak gradient measures 7.3 mmHg. Aortic valve area, by VTI measures 3.09 cm. Pulmonic Valve: The pulmonic valve was normal in structure. Pulmonic valve regurgitation is not visualized. No evidence of pulmonic stenosis. Aorta: The aortic root is normal in size and structure. Venous: The inferior vena cava is dilated in size with greater than 50% respiratory variability, suggesting right atrial pressure of 8 mmHg. IAS/Shunts: The interatrial septum was not well visualized.  LEFT VENTRICLE PLAX 2D LVIDd:         3.90 cm LVIDs:         2.70 cm LV PW:         0.90 cm LV IVS:        1.10 cm LVOT diam:     2.00 cm  3D  Volume EF: LV SV:         74       3D  EF:        69 % LV SV Index:   37 LVOT Area:     3.14 cm  RIGHT VENTRICLE RV Basal diam:  3.20 cm LEFT ATRIUM             Index       RIGHT ATRIUM           Index LA diam:        3.30 cm 1.68 cm/m  RA Area:     17.20 cm LA Vol (A2C):   53.2 ml 27.03 ml/m RA Volume:   41.90 ml  21.29 ml/m LA Vol (A4C):   48.3 ml 24.54 ml/m LA Biplane Vol: 52.0 ml 26.43 ml/m  AORTIC VALVE AV Area (Vmax):    3.00 cm AV Area (Vmean):   3.03 cm AV Area (VTI):     3.09 cm AV Vmax:           135.00 cm/s AV Vmean:          92.900 cm/s AV VTI:            0.238 m AV Peak Grad:      7.3 mmHg AV Mean Grad:      4.0 mmHg LVOT Vmax:         129.00 cm/s LVOT Vmean:        89.700 cm/s LVOT VTI:          0.234 m LVOT/AV VTI ratio: 0.98  AORTA Ao Root diam: 3.30 cm Ao Asc diam:  3.63 cm  SHUNTS Systemic VTI:  0.23 m Systemic Diam: 2.00 cm Cherlynn Kaiser MD Electronically signed by Cherlynn Kaiser MD Signature Date/Time: 05/04/2021/1:17:47 PM    Final    CT IMAGE GUIDED DRAINAGE BY PERCUTANEOUS CATHETER  Result Date: 05/14/2021 INDICATION: 87 year old gentleman with rim enhancing pelvic fluid collection presents to IR for drain placement EXAM: CT GUIDED DRAINAGE OF PELVIC ABSCESS MEDICATIONS: The patient is currently admitted to the hospital and receiving intravenous antibiotics. The antibiotics were administered within an appropriate time frame prior to the initiation of the procedure. ANESTHESIA/SEDATION: 1 mg IV Versed 50 mcg IV Fentanyl Moderate Sedation Time:  11 minutes The patient was continuously monitored during the procedure by the interventional radiology nurse under my direct supervision. COMPLICATIONS: None immediate. TECHNIQUE: Informed written consent was obtained from the patient after a thorough discussion of the procedural risks, benefits and alternatives. All questions were addressed. Maximal Sterile Barrier Technique was utilized including caps, mask, sterile gowns, sterile gloves,  sterile drape, hand hygiene and skin antiseptic. A timeout was performed prior to the initiation of the procedure. PROCEDURE: The operative field was prepped with Chlorhexidine in a sterile fashion, and a sterile drape was applied covering the operative field. A sterile gown and sterile gloves were used for the procedure. Local anesthesia was provided with 1% Lidocaine. Patient position prone on the procedure table. The right gluteal skin prepped and draped in usual fashion. Following local lidocaine administration, 18 gauge trocar needle was advanced into the pelvic fluid collection utilizing CT guidance. The needle was exchanged for 10.2 [place] multipurpose pigtail drain over 0.035 inch guidewire. Drain secured to skin with suture and connected to bulb. 50 mL sample was aspirated and sent for Gram stain and culture. IMPRESSION: 10.2 [place] multipurpose pigtail drain placed and pelvic fluid collection. Electronically Signed   By: Miachel Roux M.D.   On: 05/14/2021 12:40   [place] EKG SITE RITE  Result Date: 05/10/2021 If [employer] not  attached, placement could not be confirmed due to current cardiac rhythm.  US Abdomen Limited RUQ (LIVER/GB)  Result Date: 05/20/2021 CLINICAL DATA:  87 year old male with abnormal gallbladder on CT Abdomen and Pelvis. Abdominal pain, postop day 18 inguinal hernia repair with mesh. EXAM: ULTRASOUND ABDOMEN LIMITED RIGHT UPPER QUADRANT COMPARISON:  CT Abdomen and Pelvis 05/19/2021. FINDINGS: Gallbladder: Gallbladder distended with sludge (image 7) and there is abnormal gallbladder wall thickening up to 6 mm (image 10). Dependent shadowing stones, gravel-like on image 16. Positive sonographic Murphy sign. Common bile duct: Diameter: 8 mm, mildly dilated. Liver: No focal lesion identified. Within normal limits in parenchymal echogenicity. Mild intrahepatic biliary ductal dilatation was better demonstrated by CT. Portal vein is patent on color Doppler imaging with normal  direction of blood flow towards the liver. Other: Negative visible right kidney. IMPRESSION: 1. Acute Cholecystitis: gallbladder distended with sludge and stones, thickened wall, and positive sonographic Murphy sign. 2. Mild intra- and extrahepatic biliary ductal dilatation suspicious for associated choledocholithiasis. Electronically Signed   By: Genevie Ann M.D.   On: 05/20/2021 11:25   (Echo, Carotid, EGD, Colonoscopy, ERCP)    Subjective: No complaints  Discharge Exam: Vitals:   05/26/21 0319 05/26/21 0817  BP: 120/80 104/66  Pulse:  91  Resp: 19 16  Temp: 98.6 F (37 C) 97.8 F (36.6 C)  SpO2: 95% 96%   Vitals:   05/25/21 2100 05/26/21 0009 05/26/21 0319 05/26/21 0817  BP: 118/71 113/62 120/80 104/66  Pulse: 83 77  91  Resp: '17 16 19 16  '$ Temp: 98.2 F (36.8 C) 98.5 F (36.9 C) 98.6 F (37 C) 97.8 F (36.6 C)  TempSrc: Oral Oral Oral Oral  SpO2: 96% 96% 95% 96%  Weight:      Height:        General: Pt is alert, awake, not in acute distress Cardiovascular: RRR, S1/S2 +, no rubs, no gallops Respiratory: CTA bilaterally, no wheezing, no rhonchi Abdominal: Soft, NT, ND, bowel sounds + Extremities: no edema, no cyanosis    The results of significant diagnostics from this hospitalization (including imaging, microbiology, ancillary and laboratory) are listed below for reference.     Microbiology: No results found for this or any previous visit (from the past 240 hour(s)).   Labs: BNP (last 3 results) No results for input(s): BNP in the last 8760 hours. Basic Metabolic Panel: Recent Labs  Lab 05/22/21 0226 05/23/21 0302 05/24/21 0440 05/25/21 1000 05/26/21 0500  NA 132* 132* 133* 135 134*  K 3.7 3.9 3.9 4.1 4.3  CL 103 102 100 101 99  CO2 '23 23 27 27 28  '$ GLUCOSE 134* 122* 112* 124* 113*  BUN '15 16 15 18 17  '$ CREATININE 0.74 0.68 0.74 0.81 0.85  CALCIUM 8.2* 8.4* 8.7* 8.9 9.4   Liver Function Tests: Recent Labs  Lab 05/22/21 0226 05/23/21 0302  05/24/21 0440 05/25/21 1000 05/26/21 0500  AST 219* 85* 50* 36 29  ALT 358* 243* 174* 129* 102*  ALKPHOS 364* 366* 319* 274* 237*  BILITOT 1.4* 1.2 0.9 0.9 0.8  PROT 5.8* 6.0* 6.1* 6.6 6.8  ALBUMIN 1.6* 1.8* 1.9* 2.0* 2.2*   No results for input(s): LIPASE, AMYLASE in the last 168 hours. No results for input(s): AMMONIA in the last 168 hours. CBC: Recent Labs  Lab 05/21/21 0350 05/22/21 0226 05/23/21 0302 05/24/21 0945 05/26/21 0500  WBC 13.6* 14.1* 14.7* 14.2* 14.3*  NEUTROABS 11.0*  --   --   --   --  HGB 11.7* 11.2* 11.4* 11.0* 12.2*  HCT 33.7* 32.7* 33.5* 32.7* 36.2*  MCV 90.6 90.8 90.5 92.1 92.6  PLT 406* 403* 421* 403* 404*   Cardiac Enzymes: No results for input(s): CKTOTAL, CKMB, CKMBINDEX, TROPONINI in the last 168 hours. BNP: Invalid input(s): POCBNP CBG: Recent Labs  Lab 05/22/21 1609  GLUCAP 164*   D-Dimer No results for input(s): DDIMER in the last 72 hours. Hgb A1c No results for input(s): HGBA1C in the last 72 hours. Lipid Profile No results for input(s): CHOL, HDL, LDLCALC, TRIG, CHOLHDL, LDLDIRECT in the last 72 hours. Thyroid function studies No results for input(s): TSH, T4TOTAL, T3FREE, THYROIDAB in the last 72 hours.  Invalid input(s): FREET3 Anemia work up No results for input(s): VITAMINB12, FOLATE, FERRITIN, TIBC, IRON, RETICCTPCT in the last 72 hours. Urinalysis    Component Value Date/Time   COLORURINE YELLOW 05/03/2021 0017   APPEARANCEUR CLEAR 05/03/2021 0017   LABSPEC >1.046 (H) 05/03/2021 0017   PHURINE 5.0 05/03/2021 0017   GLUCOSEU NEGATIVE 05/03/2021 0017   HGBUR SMALL (A) 05/03/2021 0017   BILIRUBINUR NEGATIVE 05/03/2021 0017   KETONESUR 5 (A) 05/03/2021 0017   PROTEINUR NEGATIVE 05/03/2021 0017   NITRITE NEGATIVE 05/03/2021 0017   LEUKOCYTESUR NEGATIVE 05/03/2021 0017   Sepsis Labs Invalid input(s): PROCALCITONIN,  WBC,  LACTICIDVEN Microbiology No results found for this or any previous visit (from the past 240  hour(s)).   Time coordinating discharge: Over 30 minutes  SIGNED:   Charlynne Cousins, MD  [facility] 05/26/2021, 9:45 AM Pager   If 7PM-7AM, please contact night-coverage [email or web address] Password [email or web address]

## 2021-05-26 NOTE — Progress Notes (Signed)
[facility] PROGRESS NOTE    Progress Note  Gary Rice  [id number] DOB: 18-Mar-1931 DOA: 05/02/2021 PCP: Josetta Huddle, MD     Brief Narrative:   Gary Rice is an 87 y.o. male past medical history of SVT, hyperlipidemia bilateral ankle hernias came into the hospital not feeling well found in new onset atrial fibrillation and  SBO. Status post surgical intervention on 05/03/2021 with mesh placement.  He had ongoing abdominal pain so CT scan of the abdomen pelvis was done that showed perforation, general surgery proceeded with diagnostic laparoscopy performed on 05/09/2021 and he is status post bowel resection with primary anastomosis. Was briefly on TPN until 05/18/2021. During his hospitalization he did develop intra-abdominal infection with a CT scan of the abdomen and pelvis showing a large pelvic abscess, IR was consulted who placed a drain on 05/13/2021.  Repeated CT scan of the abdomen showed decreasing size and pelvic abscess but no change in smaller collections.   Assessment/Plan:  Incarcerated right inguinal hernia with SBO: He is currently on a regular diet, tolerating it well. Further management per surgery. Continue to work with physical therapy. Out of bed to chair. With drain in place placed by IR continue further recommendations per IR.  Acute cholecystitis: General surgery on board and recommended conservative management for acute cholecystitis. MRCP show acute cholecystitis without choledocholithiasis. LFTs are low back to normal limits, will stop checking. Tolerating his diet having regular bowel movements and I do see abdominal pain. Continue IV meropenem, length of antibiotic per surgery, question of PICC line has to be placed for IV meropenem. Inpatient rehab has evaluated the patient and relates he is a good candidate. His albumin is trending up. Persistent white blood cell count.  Intra-abdominal abscess: Wound culture grew bacteroids and MDR  Klebsiella oxytoca. Continue IV meropenem.  Intractable nausea and vomiting: Required NG tube placement temporarily. Now resolved.  New onset atrial fibrillation with RVR: Cardiology was consulted and he was treated with IV Cardizem and metoprolol transesophageal echo showed preserved EF. Colitis start him on DVT prophylaxis with Lovenox.  Surgery to dictate when to start anticoagulation (Eliquis). Currently rate controlled on Cardizem 120, hold Eliquis.  Dyspnea: Improved with position and incentive spirometry. Chest x-ray unremarkable. Ambulated Feb 15, 2021 dyspnea on exertion but saturations remain above 90.  Hypovolemic hyponatremia: Resolved with IV fluid hydration.  Hyperlipidemia: Continue statins.  Ascending aortic aneurysm: Continue follow-up with CT surgery as an outpatient.  Leukocytosis: Continues to have persistent leukocytosis likely due to acute cholecystitis  Maculopapular rash:  question medication we will discuss with ID to see if we can change his antibiotics.  DVT prophylaxis: Lovenox Family Communication:none Status is: Inpatient  Remains inpatient appropriate because:Hemodynamically unstable  Dispo: The patient is from: Home              Anticipated d/c is to: Home              Patient currently is not medically stable to d/c.   Difficult to place patient No   Code Status:     Code Status Orders  (From admission, onward)           Start     Ordered   05/03/21 0017  Full code  Continuous        05/03/21 0016           Code Status History     This patient has a current code status but no historical code status.  IV Access:   Peripheral IV   Procedures and diagnostic studies:   No results found.   Medical Consultants:   None.   Subjective:    Gary Rice tolerating his diet.  Objective:    Vitals:   05/25/21 2100 05/26/21 0009 05/26/21 0319 05/26/21 0817  BP: 118/71 113/62 120/80 104/66   Pulse: 83 77  91  Resp: '17 16 19 16  '$ Temp: 98.2 F (36.8 C) 98.5 F (36.9 C) 98.6 F (37 C) 97.8 F (36.6 C)  TempSrc: Oral Oral Oral Oral  SpO2: 96% 96% 95% 96%  Weight:      Height:       SpO2: 96 % O2 Flow Rate (L/min): 0 L/min   Intake/Output Summary (Last 24 hours) at 05/26/2021 0849 Last data filed at 05/26/2021 0818 Gross per 24 hour  Intake 130 ml  Output 1620 ml  Net -1490 ml    Filed Weights   05/02/21 1805 05/17/21 1242  Weight: 77.1 kg 78.4 kg    Exam: General exam: In no acute distress. Respiratory system: Good air movement and clear to auscultation. Cardiovascular system: S1 & S2 heard, RRR. No JVD. Gastrointestinal system: Abdomen is nondistended, soft and nontender.  Extremities: No pedal edema. Skin: No rashes, lesions or ulcers  Data Reviewed:    Labs: Basic Metabolic Panel: Recent Labs  Lab 05/22/21 0226 05/23/21 0302 05/24/21 0440 05/25/21 1000 05/26/21 0500  NA 132* 132* 133* 135 134*  K 3.7 3.9 3.9 4.1 4.3  CL 103 102 100 101 99  CO2 '23 23 27 27 28  '$ GLUCOSE 134* 122* 112* 124* 113*  BUN '15 16 15 18 17  '$ CREATININE 0.74 0.68 0.74 0.81 0.85  CALCIUM 8.2* 8.4* 8.7* 8.9 9.4    GFR Estimated Creatinine Clearance: 64 mL/min (by C-G formula based on SCr of 0.85 mg/dL). Liver Function Tests: Recent Labs  Lab 05/22/21 0226 05/23/21 0302 05/24/21 0440 05/25/21 1000 05/26/21 0500  AST 219* 85* 50* 36 29  ALT 358* 243* 174* 129* 102*  ALKPHOS 364* 366* 319* 274* 237*  BILITOT 1.4* 1.2 0.9 0.9 0.8  PROT 5.8* 6.0* 6.1* 6.6 6.8  ALBUMIN 1.6* 1.8* 1.9* 2.0* 2.2*    No results for input(s): LIPASE, AMYLASE in the last 168 hours. No results for input(s): AMMONIA in the last 168 hours. Coagulation profile No results for input(s): INR, PROTIME in the last 168 hours. COVID-19 Labs  No results for input(s): DDIMER, FERRITIN, LDH, CRP in the last 72 hours.  Lab Results  Component Value Date   [place] NEGATIVE 05/02/2021     CBC: Recent Labs  Lab 05/21/21 0350 05/22/21 0226 05/23/21 0302 05/24/21 0945 05/26/21 0500  WBC 13.6* 14.1* 14.7* 14.2* 14.3*  NEUTROABS 11.0*  --   --   --   --   HGB 11.7* 11.2* 11.4* 11.0* 12.2*  HCT 33.7* 32.7* 33.5* 32.7* 36.2*  MCV 90.6 90.8 90.5 92.1 92.6  PLT 406* 403* 421* 403* 404*    Cardiac Enzymes: No results for input(s): CKTOTAL, CKMB, CKMBINDEX, TROPONINI in the last 168 hours. BNP (last 3 results) No results for input(s): PROBNP in the last 8760 hours. CBG: Recent Labs  Lab 05/22/21 1609  GLUCAP 164*    D-Dimer: No results for input(s): DDIMER in the last 72 hours. Hgb A1c: No results for input(s): HGBA1C in the last 72 hours. Lipid Profile: No results for input(s): CHOL, HDL, LDLCALC, TRIG, CHOLHDL, LDLDIRECT in the last 72 hours. Thyroid function studies: No  results for input(s): TSH, T4TOTAL, T3FREE, THYROIDAB in the last 72 hours.  Invalid input(s): FREET3 Anemia work up: No results for input(s): VITAMINB12, FOLATE, FERRITIN, TIBC, IRON, RETICCTPCT in the last 72 hours. Sepsis Labs: Recent Labs  Lab 05/22/21 0226 05/23/21 0302 05/24/21 0945 05/26/21 0500  WBC 14.1* 14.7* 14.2* 14.3*    Microbiology No results found for this or any previous visit (from the past 240 hour(s)).    Medications:    acetaminophen  650 mg Oral Q6H   Chlorhexidine Gluconate Cloth  6 each Topical Daily   diltiazem  120 mg Oral Q8H   enoxaparin (LOVENOX) injection  40 mg Subcutaneous Q24H   feeding supplement  237 mL Oral TID BM   pantoprazole (PROTONIX) IV  40 mg Intravenous Q24H   sodium chloride flush  10-40 mL Intracatheter Q12H   sodium chloride flush  5 mL Intracatheter Q8H   Continuous Infusions:  lactated ringers Stopped (05/18/21 0640)   meropenem (MERREM) IV 1 g (05/26/21 0526)   methocarbamol (ROBAXIN) IV     promethazine (PHENERGAN) injection (IM or IVPB) 70 mL/hr at 05/04/21 0230      LOS: 24 days   Charlynne Cousins  [facility]  05/26/2021, 8:49 AM

## 2021-05-27 DIAGNOSIS — R5381 Other malaise: Principal | ICD-10-CM

## 2021-05-27 DIAGNOSIS — I4891 Unspecified atrial fibrillation: Secondary | ICD-10-CM | POA: Diagnosis not present

## 2021-05-27 DIAGNOSIS — K810 Acute cholecystitis: Secondary | ICD-10-CM | POA: Diagnosis not present

## 2021-05-27 DIAGNOSIS — K651 Peritoneal abscess: Secondary | ICD-10-CM | POA: Diagnosis not present

## 2021-05-27 LAB — CBC WITH DIFFERENTIAL/PLATELET
Abs Immature Granulocytes: 0.52 10*3/uL — ABNORMAL HIGH (ref 0.00–0.07)
Basophils Absolute: 0.2 10*3/uL — ABNORMAL HIGH (ref 0.0–0.1)
Basophils Relative: 1 %
Eosinophils Absolute: 0.8 10*3/uL — ABNORMAL HIGH (ref 0.0–0.5)
Eosinophils Relative: 6 %
HCT: 34.9 % — ABNORMAL LOW (ref 39.0–52.0)
Hemoglobin: 11.5 g/dL — ABNORMAL LOW (ref 13.0–17.0)
Immature Granulocytes: 4 %
Lymphocytes Relative: 14 %
Lymphs Abs: 1.8 10*3/uL (ref 0.7–4.0)
MCH: 30.8 pg (ref 26.0–34.0)
MCHC: 33 g/dL (ref 30.0–36.0)
MCV: 93.6 fL (ref 80.0–100.0)
Monocytes Absolute: 1 10*3/uL (ref 0.1–1.0)
Monocytes Relative: 7 %
Neutro Abs: 9 10*3/uL — ABNORMAL HIGH (ref 1.7–7.7)
Neutrophils Relative %: 68 %
Platelets: 401 10*3/uL — ABNORMAL HIGH (ref 150–400)
RBC: 3.73 MIL/uL — ABNORMAL LOW (ref 4.22–5.81)
RDW: 13.9 % (ref 11.5–15.5)
WBC: 13.4 10*3/uL — ABNORMAL HIGH (ref 4.0–10.5)
nRBC: 0 % (ref 0.0–0.2)

## 2021-05-27 LAB — COMPREHENSIVE METABOLIC PANEL
ALT: 82 U/L — ABNORMAL HIGH (ref 0–44)
AST: 24 U/L (ref 15–41)
Albumin: 2.3 g/dL — ABNORMAL LOW (ref 3.5–5.0)
Alkaline Phosphatase: 213 U/L — ABNORMAL HIGH (ref 38–126)
Anion gap: 7 (ref 5–15)
BUN: 20 mg/dL (ref 8–23)
CO2: 27 mmol/L (ref 22–32)
Calcium: 8.9 mg/dL (ref 8.9–10.3)
Chloride: 100 mmol/L (ref 98–111)
Creatinine, Ser: 0.75 mg/dL (ref 0.61–1.24)
GFR, Estimated: >60 mL/min (ref >60)
Glucose, Bld: 109 mg/dL — ABNORMAL HIGH (ref 70–99)
Potassium: 3.9 mmol/L (ref 3.5–5.1)
Sodium: 134 mmol/L — ABNORMAL LOW (ref 135–145)
Total Bilirubin: 1 mg/dL (ref 0.3–1.2)
Total Protein: 6.4 g/dL — ABNORMAL LOW (ref 6.5–8.1)

## 2021-05-27 LAB — AEROBIC/ANAEROBIC CULTURE W GRAM STAIN (SURGICAL/DEEP WOUND): Gram Stain: NONE SEEN

## 2021-05-27 LAB — MINIMUM INHIBITORY CONC. (1 DRUG)

## 2021-05-27 LAB — MISC LABCORP TEST (SEND OUT)
LabCorp test name: 88013
Labcorp test code: 88013

## 2021-05-27 LAB — MIC RESULT

## 2021-05-27 MED ORDER — CHLORHEXIDINE GLUCONATE CLOTH 2 % EX PADS
6.0000 | MEDICATED_PAD | Freq: Every day | CUTANEOUS | Status: DC
  Administered 2021-05-27 – 2021-06-04 (×12): 6 via TOPICAL

## 2021-05-27 NOTE — Progress Notes (Signed)
Inpatient [facility] Individual Statement of Services  Patient Name:  [facility]  Date:  05/27/2021  Welcome to the [facility].  Our goal is to provide you with an individualized program based on your diagnosis and situation, designed to meet your specific needs.  With this comprehensive rehabilitation program, you will be expected to participate in at least 3 hours of rehabilitation therapies Monday-Friday, with modified therapy programming on the weekends.  Your rehabilitation program will include the following services:  Physical Therapy (PT), Occupational Therapy (OT), 24 hour per day rehabilitation nursing, Care Coordinator, Rehabilitation Medicine, Nutrition Services, and Pharmacy Services  Weekly team conferences will be held on Tuesday to discuss your progress.  Your Inpatient Rehabilitation Care Coordinator will talk with you frequently to get your input and to update you on team discussions.  Team conferences with you and your family in attendance may also be held.  Expected length of stay: 7-10 days  Overall anticipated outcome: Independent with device  Depending on your progress and recovery, your program may change. Your Inpatient Rehabilitation Care Coordinator will coordinate services and will keep you informed of any changes. Your Inpatient Rehabilitation Care Coordinator's name and contact numbers are listed  below.  The following services may also be recommended but are not provided by the [facility] will be made to provide these services after discharge if needed.  Arrangements include referral to agencies that provide these services.  Your insurance has been verified to be:  [employer] Your primary doctor is:  Josetta Huddle  Pertinent information will be shared with your doctor and your insurance  company.  Inpatient Rehabilitation Care Coordinator:  Ovidio Kin, [place] or Emilia Beck  Information discussed with and copy given to patient by: Elease Hashimoto, 05/27/2021, 10:48 AM

## 2021-05-27 NOTE — Progress Notes (Signed)
Physical Therapy Session Note  Patient Details  Name: Gary Rice MRN: [id number] Date of Birth: May 07, 1931  Today's Date: 05/27/2021 PT Individual Time: 3007-6226 PT Individual Time Calculation (min): 40 min   Short Term Goals: Week 1:  PT Short Term Goal 1 (Week 1): STG = LTG due to LOS  Skilled Therapeutic Interventions/Progress Updates:  Pt received supine in bed, very fatigued but denied pain. Emphasis of session on balance assessment to assess fall risk. Pt performed bed mobility w/supervision and completed Berg without use of AD. Pt scored a 33/56, informed pt of results interpretation and importance of use of AD at home, pt verbalized understanding. Pt requested need to have BM and ambulated to bathroom w/RW and supervision. Pt doffed pants w/supervision and performed stand <>sit using rail and supervision. Pt was left sitting on toilet in bathroom per nursing request w/access to call bell, all immediate needs met.   Therapy Documentation Precautions:  Precautions Precautions: Fall Precaution Comments: JP drain R abdomen Restrictions Weight Bearing Restrictions: No Balance: Balance Balance Assessed: Yes Standardized Balance Assessment Standardized Balance Assessment: Berg Balance Test Berg Balance Test Sit to Stand: Able to stand  independently using hands Standing Unsupported: Able to stand 2 minutes with supervision Sitting with Back Unsupported but Feet Supported on Floor or Stool: Able to sit safely and securely 2 minutes Stand to Sit: Controls descent by using hands Transfers: Able to transfer safely, definite need of hands Standing Unsupported with Eyes Closed: Able to stand 10 seconds with supervision Standing Ubsupported with Feet Together: Able to place feet together independently and stand for 1 minute with supervision From Standing, Reach Forward with Outstretched Arm: Reaches forward but needs supervision From Standing Position, Pick up Object from Floor:  Able to pick up shoe, needs supervision From Standing Position, Turn to Look Behind Over each Shoulder: Turn sideways only but maintains balance Turn 360 Degrees: Able to turn 360 degrees safely but slowly Standing Unsupported, Alternately Place Feet on Step/Stool: Needs assistance to keep from falling or unable to try Standing Unsupported, One Foot in Front: Able to take small step independently and hold 30 seconds Standing on One Leg: Tries to lift leg/unable to hold 3 seconds but remains standing independently Total Score: 33  Therapy/Group: Individual Therapy Cruzita Lederer Zenab [place], PT, DPT  05/27/2021, 3:23 PM

## 2021-05-27 NOTE — Progress Notes (Addendum)
PROGRESS NOTE   Subjective/Complaints: Ha a pretty good night. Minimal pain. Ready to get out of bed and moving today! Has a chronic left inguinal hernia which he wears a strap for. Wondered if he needs it.  ROS: Patient denies fever, rash, sore throat, blurred vision, nausea, vomiting, diarrhea, cough, shortness of breath or chest pain, joint or back pain, headache, or mood change.    Objective:   No results found. Recent Labs    05/26/21 0500 05/27/21 0500  WBC 14.3* 13.4*  HGB 12.2* 11.5*  HCT 36.2* 34.9*  PLT 404* 401*   Recent Labs    05/26/21 0500 05/27/21 0500  NA 134* 134*  K 4.3 3.9  CL 99 100  CO2 28 27  GLUCOSE 113* 109*  BUN 17 20  CREATININE 0.85 0.75  CALCIUM 9.4 8.9    Intake/Output Summary (Last 24 hours) at 05/27/2021 0929 Last data filed at 05/27/2021 0905 Gross per 24 hour  Intake 430 ml  Output 1765 ml  Net -1335 ml        Physical Exam: Vital Signs Blood pressure 112/71, pulse 83, temperature 98.4 F (36.9 C), temperature source Oral, resp. rate 18, height '5\' 11"'$  (1.803 m), weight 75.9 kg, SpO2 96 %.  General: Alert and oriented x 3, No apparent distress HEENT: Head is normocephalic, atraumatic, PERRLA, EOMI, sclera anicteric, oral mucosa pink and moist, dentition intact, ext ear canals clear,  Neck: Supple without JVD or lymphadenopathy Heart: Reg rate and rhythm. No murmurs rubs or gallops Chest: CTA bilaterally without wheezes, rales, or rhonchi; no distress Abdomen: Soft, non-tender, non-distended, bowel sounds positive. Extremities: No clubbing, cyanosis, or edema. Pulses are 2+ Psych: Pt's affect is appropriate. Pt is cooperative Skin: right pelvic incision CDI, JP drain Neuro:  Alert and oriented x 3. Normal insight and awareness. Intact Memory. Normal language and speech. Cranial nerve exam unremarkable. UE 5/5. LE 4- prox to 4+ distally. No sensory changes Musculoskeletal:  Left inguinal hernia visible, non-tender    Assessment/Plan: 1. Functional deficits which require 3+ hours per day of interdisciplinary therapy in a comprehensive inpatient rehab setting. Physiatrist is providing close team supervision and 24 hour management of active medical problems listed below. Physiatrist and rehab team continue to assess barriers to discharge/monitor patient progress toward functional and medical goals  Care Tool:  Bathing              Bathing assist       Upper Body Dressing/Undressing Upper body dressing   What is the patient wearing?: Pull over shirt    Upper body assist Assist Level: Set up assist    Lower Body Dressing/Undressing Lower body dressing      What is the patient wearing?: Pants     Lower body assist       Toileting Toileting    Toileting assist Assist for toileting: Contact Guard/Touching assist     Transfers Chair/bed transfer  Transfers assist           Locomotion Ambulation   Ambulation assist              Walk 10 feet activity   Assist  Walk 50 feet activity   Assist           Walk 150 feet activity   Assist           Walk 10 feet on uneven surface  activity   Assist           Wheelchair     Assist               Wheelchair 50 feet with 2 turns activity    Assist            Wheelchair 150 feet activity     Assist          Blood pressure 112/71, pulse 83, temperature 98.4 F (36.9 C), temperature source Oral, resp. rate 18, height '5\' 11"'$  (1.803 m), weight 75.9 kg, SpO2 96 %.  Medical Problem List and Plan: 1.  Debility secondary to pelvic abscess             -patient may shower but incision must be covered             -ELOS/Goals: 5-7 days modI/S            Patient is beginning CIR therapies today including PT and OT  2.  Antithrombotics: -DVT/anticoagulation:  Pharmaceutical: Other (comment) Eliquis started   -antiplatelet  therapy: N/A 3. Post-operative pain:   tylenol  325 mg tid for pain.    4. Mood: LCSW to follow for evaluation and support.              -antipsychotic agents: N/A 5. Neuropsych: This patient is capable of making decisions on his own behalf. 6. Skin/Wound Care:   dressing changes to midline wound.              --Continue ensure TID to promote wound healing 7. Fluids/Electrolytes/Nutrition: good appetite  -I personally reviewed the patient's labs today.   8. Cholecystitis: Monitor for rise in LFTs, rise in WBC or pain --LFT's continually falling. Continue to monitor --Decreased tylenol to 325 mg TID.  --Continue Meropenum X 2 weeks w/end date 09/05 9. Pelvic abscess a/p drain placement: Cultures +Kleb oxytoca--> Meropenum (S) thorough --monitor for SE/tolerance of antibiotics  10. Hyponatremia: WNL 11. Leucocytosis: WBC trending down from 26.5-->14.3> 13.4             --continue to monitor for fevers and other signs of infection.  12. Low calorie malnutrition: Alb-2.2. added juven additionally to help promote wound healing.   -encouraged him to keep eating as he's been  13.  Pre-diabetes: Hgb A1C-5.8. Fasting BS reasonable. Changed Ensure to Ensure Max but pt doesn't like them. 14. H/o  SVT/New onset AFib: Monitor HR TID--continue Diltiazem 120 mg TID with Eliquis bid             --off statin due to Abnormal LFTs.   -HR well controlled 15. Acute blood loss anemia: added MVI with iron supplement     LOS: 1 days A FACE TO FACE EVALUATION WAS PERFORMED  Meredith Staggers 05/27/2021, 9:29 AM

## 2021-05-27 NOTE — Evaluation (Addendum)
Occupational Therapy Assessment and Plan  Patient Details  Name: Gary Rice MRN: [id number] Date of Birth: 1931/07/19  OT Diagnosis: muscle weakness (generalized) Rehab Potential: Rehab Potential (ACUTE ONLY): Good ELOS: 7-10 days   Today's Date: 05/27/2021 OT Individual Time: 0905-1005; and 1330-1415 OT Individual Time Calculation (min): 60 min   ; and 45 min  Hospital Problem: Principal Problem:   Debility   Past Medical History:  Past Medical History:  Diagnosis Date   Astigmatism    Basal cell carcinoma    Inguinal hernia    bilateral   Macular degeneration, age related, nonexudative    bilateral   SVT (supraventricular tachycardia) ([place])    Past Surgical History:  Past Surgical History:  Procedure Laterality Date   APPENDECTOMY     age 46   BOWEL RESECTION  05/09/2021   Procedure: SMALL BOWEL RESECTION;  Surgeon: Rolm Bookbinder, MD;  Location: [facility];  Service: General;;   CATARACT EXTRACTION, BILATERAL     HERNIA REPAIR     RIH   INGUINAL HERNIA REPAIR Right 05/02/2021   Procedure: REPAIR  INCARCERATED INGUINAL HERNIA  WITH MESH;  Surgeon: Jesusita Oka, MD;  Location: [facility];  Service: General;  Laterality: Right;   JOINT REPLACEMENT Right    RTHA   LAPAROSCOPY N/A 05/09/2021   Procedure: LAPAROSCOPY DIAGNOSTIC;  Surgeon: Rolm Bookbinder, MD;  Location: [facility];  Service: General;  Laterality: N/A;   LAPAROTOMY  05/09/2021   Procedure: EXPLORATORY LAPAROTOMY;  Surgeon: Rolm Bookbinder, MD;  Location: [facility];  Service: General;;   TONSILLECTOMY      Assessment & Plan Clinical Impression: Gary Rice is an 87 year old male with history of SVT, bilateral inginal hernias who was admitted on 05/02/21 with vomiting due to incarcerated hernia w/SBO as well as new onset of Afib w flutter. He was started on IV diltiazem for rate control per Dr. Rudi Rummage with recommendations to start Eliquis once cleared and was taken to OR emergently for repair of  incarcerated hernia by Dr. Bobbye Morton the same day.  He was also found to have incidental ascending aortic aneurysm and to follow up with Dr. Etter Sjogren with f/u in A fib clinic per Dr. Rayann Heman. Hospital course significant for increase in abdominal distension once NGT removed due to SBO with microperforation and was taken to OR 08/12 for open small bowel resection by Dr. Donne Hazel. He was maintained on TNA for nutritional support He started developing rise in WBC on 08/15 due to development of pelvic abscess which was drained and pelvic drain placed by Dr. Dwaine Gale on 08/16.  Cultures grew out Klebsiella Oxytoca/Bacteriodes Fragilis which was initially treated with Zosyn (R).   ID consulted for input  and antibiotics changed to Meropenum. He developed rise in LFTs due to cholecystitis with cholelithiasis and follow up CT showed significant decrease in size abscess. Dr. Paulita Fujita consulted for input due to dilated CBD and recommended MRCP which showed acute cholecystitis but was negative for choledocholithiasis without significant CBD dilatation or obstructing liver mass.  GI/General surgery recommends conservative care with antibiotics for treatment as LFTs improving and patient tolerating diet with pain or N/V. If patient develops symptoms may need perc chole drain. He did develop rash on 08/25 --likely drug eruption which is being monitored (Cipro/Bactrim less ideal given age) with benadryl prn itching.  Midline incision healing, he is tolerating po's with + BM but continues to have limitations in mobility and ability to carry out ADLs. Patient transferred to CIR  on 05/26/2021 .    Patient currently requires supervision to min with basic self-care skills and IADL secondary to muscle weakness, decreased cardiorespiratoy endurance, decreased coordination, and decreased sitting balance, decreased standing balance, decreased postural control, and decreased balance strategies.  Prior to hospitalization, patient could complete ADLs  and IADLs with independent .  Patient will benefit from skilled intervention to increase independence with basic self-care skills prior to discharge home independently.  Anticipate patient will require  A  and follow up home health.  OT - End of Session Activity Tolerance: Tolerates 30+ min activity with multiple rests Endurance Deficit: Yes Endurance Deficit Description: Pt feeling fatigued after standing sinkside for oral hygiene needing seated rest break. OT Assessment Rehab Potential (ACUTE ONLY): Good OT Barriers to Discharge: Inaccessible home environment;Decreased caregiver support;IV antibiotics OT Patient demonstrates impairments in the following area(s): Balance;Endurance;Motor OT Basic ADL's Functional Problem(s): Grooming;Bathing;Dressing;Toileting OT Transfers Functional Problem(s): Toilet;Tub/Shower OT Plan OT Intensity: Minimum of 1-2 x/day, 45 to 90 minutes OT Frequency: 5 out of 7 days OT Duration/Estimated Length of Stay: 7-10 days OT Treatment/Interventions: Balance/vestibular training;Discharge planning;Self Care/advanced ADL retraining;Therapeutic Activities;UE/LE Coordination activities;Disease mangement/prevention;Functional mobility training;Patient/family education;Therapeutic Exercise;Visual/perceptual remediation/compensation;Skin care/wound managment;DME/adaptive equipment instruction;Neuromuscular re-education;Community reintegration;Psychosocial support;UE/LE Strength taining/ROM OT Basic Self-Care Anticipated Outcome(s): mod I OT Toileting Anticipated Outcome(s): mod I OT Bathroom Transfers Anticipated Outcome(s): mod I OT Recommendation Patient destination: Home Follow Up Recommendations: Home health OT Equipment Recommended: To be determined Equipment Details: Pt has bedside commode from previous hip surgery; also has a single point cane   OT Evaluation Precautions/Restrictions  Precautions Precautions: Fall Precaution Comments: JP drain R  abdomen Restrictions Weight Bearing Restrictions: No General Chart Reviewed: Yes Pain Pain Assessment Pain Scale: 0-10 Pain Score: 0-No pain Home Living/Prior Functioning Home Living Family/patient expects to be discharged to:: Private residence Living Arrangements: Spouse/significant other Available Help at Discharge: Family, Available 24 hours/day (Available for supervision, not assistance) Type of Home: House Home Access: Stairs to enter [employer] of Steps: 13 steps to get in front door, 11 to garage, 4 steps by back deck Entrance Stairs-Rails: Right Home Layout: Two level, Able to live on main level with bedroom/bathroom, Bed/bath upstairs Alternate Level Stairs-Number of Steps: 17 steps Alternate Level Stairs-Rails: Can reach both, Left, Right Bathroom Shower/Tub: Walk-in shower, Chiropodist: Standard Bathroom Accessibility: Yes Additional Comments: one fall recently- fell making a turn into the bathroom and hit hard on R hip but no injuries besides bruising. only uses SPC PRN  Lives With: Spouse (wife has health problems requiring assist from pt) Prior Function Level of Independence: Independent with basic ADLs, Independent with gait, Independent with homemaking with ambulation, Independent with transfers Vocation: Retired Comments: cuts own lawn, still driving, going to grocery store; still managing bills and meds Vision Baseline Vision/History: 1 Wears glasses Wears Glasses: At all times Ability to See in Adequate Light: 0 Adequate Patient Visual Report: No change from baseline Vision Assessment?: No apparent visual deficits Perception  Perception: Within Functional Limits Praxis Praxis: Intact Cognition Overall Cognitive Status: Within Functional Limits for tasks assessed Arousal/Alertness: Awake/alert Orientation Level: Person;Place;Situation Person: Oriented Place: Oriented Situation: Oriented Year: 2022 Month: August Day of  Week: Correct Memory: Appears intact Immediate Memory Recall: Sock;Blue;Bed Memory Recall Sock: Without Cue Memory Recall Blue: Without Cue Memory Recall Bed: Not able to recall Attention: Focused;Sustained Focused Attention: Appears intact Sustained Attention: Appears intact Awareness: Appears intact Problem Solving: Appears intact Safety/Judgment: Appears intact Sensation Sensation Light Touch: Appears Intact Hot/Cold: Appears Intact Proprioception:  Appears Intact Stereognosis: Not tested Coordination Gross Motor Movements are Fluid and Coordinated: No Fine Motor Movements are Fluid and Coordinated: Yes Coordination and Movement Description: impaired secondary to general deconditioning Finger Nose Finger Test: [facility] Heel Shin Test: [facility] Motor  Motor Motor: Within Functional Limits  Trunk/Postural Assessment  Cervical Assessment Cervical Assessment: Within Functional Limits Thoracic Assessment Thoracic Assessment: Within Functional Limits Lumbar Assessment Lumbar Assessment: Exceptions to [facility] (mild posterior pelvic tilt) Postural Control Postural Control: Within Functional Limits  Balance Balance Balance Assessed: Yes Static Sitting Balance Static Sitting - Balance Support: No upper extremity supported Static Sitting - Level of Assistance: 6: Modified independent (Device/Increase time) Dynamic Sitting Balance Dynamic Sitting - Balance Support: No upper extremity supported;Feet supported Dynamic Sitting - Level of Assistance: 5: Stand by assistance Static Standing Balance Static Standing - Balance Support: No upper extremity supported;During functional activity Static Standing - Level of Assistance: 5: Stand by assistance Dynamic Standing Balance Dynamic Standing - Balance Support: No upper extremity supported;During functional activity Dynamic Standing - Level of Assistance: 4: Min assist Extremity/Trunk Assessment RUE Assessment RUE Assessment: Exceptions to  [facility] Passive Range of Motion (PROM) Comments: WNL Active Range of Motion (AROM) Comments: WNL General Strength Comments: 4/5 globally LUE Assessment LUE Assessment: Exceptions to [facility] Passive Range of Motion (PROM) Comments: WNL Active Range of Motion (AROM) Comments: WNL General Strength Comments: 4/5 globally  Care Tool Care Tool Self Care Eating   Eating Assist Level: Independent    Oral Care    Oral Care Assist Level: Set up assist    Bathing   Body parts bathed by patient: Right arm;Left arm;Chest;Abdomen;Buttocks;Right upper leg;Left upper leg;Right lower leg;Left lower leg;Face   Body parts n/a: Front perineal area Assist Level: Contact Guard/Touching assist    Upper Body Dressing(including orthotics)   What is the patient wearing?: Pull over shirt        Lower Body Dressing (excluding footwear)          Putting on/Taking off footwear             Care Tool Toileting Toileting activity         Care Tool Bed Mobility Roll left and right activity   Roll left and right assist level: Supervision/Verbal cueing    Sit to lying activity   Sit to lying assist level: Supervision/Verbal cueing    Lying to sitting on side of bed activity   Lying to sitting on side of bed assist level: the ability to move from lying on the back to sitting on the side of the bed with no back support.: Supervision/Verbal cueing     Care Tool Transfers Sit to stand transfer   Sit to stand assist level: Supervision/Verbal cueing (RW)    Chair/bed transfer   Chair/bed transfer assist level: Supervision/Verbal cueing (RW)     Toilet transfer         Care Tool Cognition  Expression of Ideas and Wants Expression of Ideas and Wants: 4. Without difficulty (complex and basic) - expresses complex messages without difficulty and with speech that is clear and easy to understand  Understanding Verbal and Non-Verbal Content Understanding Verbal and Non-Verbal Content: 4. Understands  (complex and basic) - clear comprehension without cues or repetitions   Memory/Recall Ability Memory/Recall Ability : Location of own room;That he or she is in a hospital/hospital unit;Current season   Refer to Care Plan for Long Term Goals  SHORT TERM GOAL WEEK 1 OT Short Term Goal 1 (Week 1):  STGs=LTGs due to ELOS  Recommendations for other services: None    Skilled Therapeutic Intervention ADL ADL Grooming: Setup Where Assessed-Grooming: Standing at sink Upper Body Bathing: Supervision/safety Where Assessed-Upper Body Bathing: Standing at sink Lower Body Bathing: Supervision/safety Where Assessed-Lower Body Bathing: Sitting at sink Upper Body Dressing: Supervision/safety Where Assessed-Upper Body Dressing: Sitting at sink Lower Body Dressing: Supervision/safety Where Assessed-Lower Body Dressing: Sitting at sink Mobility  Bed Mobility Bed Mobility: Rolling Right;Supine to Sit Rolling Right: Independent Supine to Sit: Independent with assistive device Transfers Sit to Stand: Supervision/Verbal cueing Stand to Sit: Supervision/Verbal cueing  Skilled Intervention:  First session:  Pt semi reclined in bed, no c/o pain, eager to get better and go home to help take care of wife who has health issues.  Initial evaluation completed and collaborated on OT POC.  Pt completed self care and functional mobility per above levels of assist.  Pt reports he has been wearing and inguinal hernia support belt, however due to pt's incision location therapist recommending to hold off until further clarification from MD.  Pt sitting up in recliner at end of session call bell in reach, seat alarm on.  Second session: Pt sitting up in recliner, asleep, but easily aroused awake.  Reports feeling tired from previous therapy sessions.  Agreeable to working on simulated shower threshold transfer but hesitant to attempt without AD.  Pt able to step in and out of simulated shower using RW with close  supervision.  Pt returned to sitting in recliner and completed BUE strengthening using 4lb dowel: biceps curls, horizontal chest presses, and shoulder forward flexion.  3 x 12 reps completed.  Pt returned to bed level at end of session using RW to ambulate with supervision.  Call bell in reach, bed alarm on.   Discharge Criteria: Patient will be discharged from OT if patient refuses treatment 3 consecutive times without medical reason, if treatment goals not met, if there is a change in medical status, if patient makes no progress towards goals or if patient is discharged from hospital.  The above assessment, treatment plan, treatment alternatives and goals were discussed and mutually agreed upon: by patient  Ezekiel Slocumb 05/27/2021, 12:54 PM

## 2021-05-27 NOTE — Plan of Care (Signed)
  Problem: Sit to Stand Goal: LTG:  Patient will perform sit to stand with assistance level (PT) Description: LTG:  Patient will perform sit to stand with assistance level (PT) Flowsheets (Taken 05/27/2021 1312) LTG: PT will perform sit to stand in preparation for functional mobility with assistance level: (LRAD) Independent with assistive device Note: LRAD   Problem: RH Bed Mobility Goal: LTG Patient will perform bed mobility with assist (PT) Description: LTG: Patient will perform bed mobility with assistance, with/without cues (PT). Flowsheets (Taken 05/27/2021 1312) LTG: Pt will perform bed mobility with assistance level of: Independent   Problem: RH Bed to Chair Transfers Goal: LTG Patient will perform bed/chair transfers w/assist (PT) Description: LTG: Patient will perform bed to chair transfers with assistance (PT). Flowsheets (Taken 05/27/2021 1312) LTG: Pt will perform Bed to Chair Transfers with assistance level: (LRAD) Independent with assistive device  Note: LRAD   Problem: RH Car Transfers Goal: LTG Patient will perform car transfers with assist (PT) Description: LTG: Patient will perform car transfers with assistance (PT). Flowsheets (Taken 05/27/2021 1312) LTG: Pt will perform car transfers with assist:: ([place]) Independent with assistive device  Note: LRAD   Problem: RH Ambulation Goal: LTG Patient will ambulate in controlled environment (PT) Description: LTG: Patient will ambulate in a controlled environment, # of feet with assistance (PT). Flowsheets (Taken 05/27/2021 1312) LTG: Pt will ambulate in controlled environ  assist needed:: (LRAD) Independent with assistive device LTG: Ambulation distance in controlled environment: 200' Goal: LTG Patient will ambulate in home environment (PT) Description: LTG: Patient will ambulate in home environment, # of feet with assistance (PT). Flowsheets (Taken 05/27/2021 1312) LTG: Pt will ambulate in home environ  assist needed::  (LRAD) Independent with assistive device LTG: Ambulation distance in home environment: 84'   Problem: RH Stairs Goal: LTG Patient will ambulate up and down stairs w/assist (PT) Description: LTG: Patient will ambulate up and down # of stairs with assistance (PT) Flowsheets (Taken 05/27/2021 1312) LTG: Pt will ambulate up/down stairs assist needed:: (LRAD) Independent with assistive device LTG: Pt will  ambulate up and down number of stairs: 17

## 2021-05-27 NOTE — Progress Notes (Signed)
Patient ID: Gary Rice, male   DOB: 1931/05/14, 87 y.o.   MRN: [id number] Met with the patient to introduce self and review role of the nurse CM. Discussed dressing changes and JP drain flush. Patient is hopeful that JP will be removed prior to discharge and will cross dressing change bridge, closer to discharge date. He will complete dressings on his own; wife unable to assist. Also, IV abx through 9/5 for cholecystitis per MD. Reviewed increasing protein due to low albumin. Patient does not like ensure or Juven but will take magic cups and boost. Patient is anxious to go home but reports he needs to be independent as possible and able to drive at discharge as wife is not able to assist and has not driven in about 3 years. Continue to follow along to discharge to address education for discharge and collaborate with the team to facilitate preparation for discharge. Margarito Liner, RN

## 2021-05-27 NOTE — Evaluation (Signed)
Physical Therapy Assessment and Plan  Patient Details  Name: Gary Rice MRN: [id number] Date of Birth: [birth year]  PT Diagnosis: Difficulty walking, Muscle weakness, and Pain in pelvic wound Rehab Potential: Good ELOS: 7-10 days   Today's Date: 05/27/2021 PT Individual Time: 3794-3276 PT Individual Time Calculation (min): 59 min    Hospital Problem: Principal Problem:   Debility   Past Medical History:  Past Medical History:  Diagnosis Date   Astigmatism    Basal cell carcinoma    Inguinal hernia    bilateral   Macular degeneration, age related, nonexudative    bilateral   SVT (supraventricular tachycardia) ([place])    Past Surgical History:  Past Surgical History:  Procedure Laterality Date   APPENDECTOMY     age 21   BOWEL RESECTION  05/09/2021   Procedure: SMALL BOWEL RESECTION;  Surgeon: Rolm Bookbinder, MD;  Location: [place];  Service: General;;   CATARACT EXTRACTION, BILATERAL     HERNIA REPAIR     RIH   INGUINAL HERNIA REPAIR Right 05/02/2021   Procedure: REPAIR  INCARCERATED INGUINAL HERNIA  WITH MESH;  Surgeon: Jesusita Oka, MD;  Location: [place];  Service: General;  Laterality: Right;   JOINT REPLACEMENT Right    RTHA   LAPAROSCOPY N/A 05/09/2021   Procedure: LAPAROSCOPY DIAGNOSTIC;  Surgeon: Rolm Bookbinder, MD;  Location: [place];  Service: General;  Laterality: N/A;   LAPAROTOMY  05/09/2021   Procedure: EXPLORATORY LAPAROTOMY;  Surgeon: Rolm Bookbinder, MD;  Location: [place];  Service: General;;   TONSILLECTOMY      Assessment & Plan Clinical Impression: Patient is a 87 y.o. year old male with history of SVT, bilateral inginal hernias who was admitted on 05/02/21 with vomiting due to incarcerated hernia w/SBO as well as new onset of Afib w flutter. He was started on IV diltiazem for rate control per Dr. Rudi Rummage with recommendations to start Eliquis once cleared and was taken to OR emergently for repair of incarcerated hernia by Dr. Bobbye Morton the  same day.  He was also found to have incidental ascending aortic aneurysm and to follow up with Dr. Etter Sjogren with f/u in A fib clinic per Dr. Rayann Heman. Hospital course significant for increase in abdominal distension once NGT removed due to SBO with microperforation and was taken to OR 08/12 for open small bowel resection by Dr. Donne Hazel. He was maintained on TNA for nutritional support He started developing rise in WBC on 08/15 due to development of pelvic abscess which was drained and pelvic drain placed by Dr. Dwaine Gale on 08/16.  Cultures grew out Klebsiella Oxytoca/Bacteriodes Fragilis which was initially treated with Zosyn (R).   ID consulted for input  and antibiotics changed to Meropenum. He developed rise in LFTs due to cholecystitis with cholelithiasis and follow up CT showed significant decrease in size abscess. Dr. Paulita Fujita consulted for input due to dilated CBD and recommended MRCP which showed acute cholecystitis but was negative for choledocholithiasis without significant CBD dilatation or obstructing liver mass.  GI/General surgery recommends conservative care with antibiotics for treatment as LFTs improving and patient tolerating diet with pain or N/V. If patient develops symptoms may need perc chole drain. He did develop rash on 08/25 --likely drug eruption which is being monitored (Cipro/Bactrim less ideal given age) with benadryl prn itching.  Midline incision healing, he is tolerating po's with + BM but continues to have limitations in mobility and ability to carry out ADLs. CIR recommended due to functional decline.  He is  eager to return home as soon as physically able- worried that his wife is home alone.  Patient currently requires supervision with mobility secondary to muscle weakness and wound care .  Prior to hospitalization, patient was modified independent  with mobility and lived with Spouse (wife has health problems requiring assist from pt) in a House home.  Home access is 13 steps to get  in front door, 11 to garage, 4 steps by back deckStairs to enter.  Patient will benefit from skilled PT intervention to maximize safe functional mobility, minimize fall risk, and decrease caregiver burden for planned discharge  home with wife who is unable to provide assistance .  Anticipate patient will benefit from follow up [place] at discharge.  PT - End of Session Activity Tolerance: Tolerates 30+ min activity with multiple rests Endurance Deficit: Yes Endurance Deficit Description: Pt fatigued after ambulating 150', requested short sitting rest break PT Assessment Rehab Potential (ACUTE/IP ONLY): Good PT Barriers to Discharge: [place] home environment;Decreased caregiver support;Home environment access/layout;Wound Care;Lack of/limited family support PT Barriers to Discharge Comments: Stairs inside home, wife incapable of providing assistance PT Patient demonstrates impairments in the following area(s): Endurance;Skin Integrity;Safety PT Transfers Functional Problem(s): Bed Mobility;Bed to Chair;Car;Furniture PT Locomotion Functional Problem(s): Ambulation;Stairs PT Plan PT Intensity: Minimum of 1-2 x/day ,45 to 90 minutes PT Frequency: 5 out of 7 days PT Duration Estimated Length of Stay: 7-10 days PT Treatment/Interventions: Ambulation/gait training;Community reintegration;DME/adaptive equipment instruction;Psychosocial support;Neuromuscular re-education;Stair training;UE/LE Strength taining/ROM;Balance/vestibular training;Discharge planning;Functional electrical stimulation;Pain management;Skin care/wound management;Therapeutic Activities;UE/LE Coordination activities;Cognitive remediation/compensation;Disease management/prevention;Functional mobility training;Patient/family education;Therapeutic Exercise PT Transfers Anticipated Outcome(s): Mod I PT Locomotion Anticipated Outcome(s): Mod I PT Recommendation Recommendations for Other Services: Therapeutic Recreation  consult Therapeutic Recreation Interventions: Pet therapy;Kitchen group;Stress management;Outing/community reintergration Follow Up Recommendations: Home health PT Patient destination: Home Equipment Recommended: To be determined   PT Evaluation Precautions/Restrictions Precautions Precautions: Fall Precaution Comments: JP drain R abdomen Restrictions Weight Bearing Restrictions: No Pain Pain Assessment Pain Scale: 0-10 Pain Score: 0-No pain Pain Interference Pain Interference Pain Effect on Sleep: 1. Rarely or not at all Pain Interference with Therapy Activities: 1. Rarely or not at all Pain Interference with Day-to-Day Activities: 1. Rarely or not at all Home Living/Prior [place]: Spouse/significant other Available Help at Discharge: Family;Available 24 hours/day (Available for supervision, not assistance) Type of Home: House Home Access: Stairs to enter [employer] of Steps: 13 steps to get in front door, 11 to garage, 4 steps by back deck Entrance Stairs-Rails: Right Home Layout: Two level;Able to live on main level with bedroom/bathroom;Bed/bath upstairs Alternate Level Stairs-Number of Steps: 17 steps Alternate Level Stairs-Rails: Can reach both;Left;Right Bathroom Shower/Tub: Walk-in shower;Tub/shower unit Bathroom Toilet: Standard Bathroom Accessibility: Yes Additional Comments: one fall recently- fell making a turn into the bathroom and hit hard on R hip but no injuries besides bruising. only uses SPC PRN  Lives With: Spouse (wife has health problems requiring assist from pt) Prior Function Level of Independence: Independent with basic ADLs;Independent with gait;Independent with homemaking with ambulation;Independent with transfers Vocation: Retired Comments: cuts own lawn, still driving, going to grocery store; still managing bills and meds Vision/Perception  Vision - History Ability to See in Adequate Light: 0  Adequate Perception Perception: Within Functional Limits Praxis Praxis: Intact  Cognition Overall Cognitive Status: Within Functional Limits for tasks assessed Arousal/Alertness: Awake/alert Orientation Level: Oriented X4 Year: 2022 Month: August Day of Week: Correct Attention: Focused;Sustained Focused Attention: Appears intact Sustained Attention: Appears intact Memory: Appears intact Immediate  Memory Recall: Sock;Blue;Bed Memory Recall Sock: Without Cue Memory Recall Blue: Without Cue Memory Recall Bed: Not able to recall Awareness: Appears intact Problem Solving: Appears intact Safety/Judgment: Appears intact Sensation Sensation Light Touch: Appears Intact Hot/Cold: Appears Intact Proprioception: Appears Intact Stereognosis: Not tested Coordination Gross Motor Movements are Fluid and Coordinated: No Fine Motor Movements are Fluid and Coordinated: Yes Coordination and Movement Description: impaired secondary to general deconditioning Finger Nose Finger Test: [facility] Heel Shin Test: [facility] Motor  Motor Motor: Within Functional Limits   Trunk/Postural Assessment  Cervical Assessment Cervical Assessment: Within Functional Limits Thoracic Assessment Thoracic Assessment: Within Functional Limits Lumbar Assessment Lumbar Assessment: Within Functional Limits Postural Control Postural Control: Within Functional Limits  Balance Balance Balance Assessed: Yes Static Sitting Balance Static Sitting - Balance Support: No upper extremity supported;Feet supported Static Sitting - Level of Assistance: 7: Independent Dynamic Sitting Balance Dynamic Sitting - Balance Support: Feet supported;No upper extremity supported Dynamic Sitting - Level of Assistance: 5: Stand by assistance Static Standing Balance Static Standing - Balance Support: Bilateral upper extremity supported;During functional activity Static Standing - Level of Assistance: 5: Stand by assistance Dynamic Standing  Balance Dynamic Standing - Balance Support: Bilateral upper extremity supported;During functional activity Dynamic Standing - Level of Assistance: 5: Stand by assistance Dynamic Standing - Balance Activities: Forward lean/weight shifting;Lateral lean/weight shifting Extremity Assessment  RLE Assessment RLE Assessment: Within Functional Limits General Strength Comments: Grossly 4+/5 LLE Assessment LLE Assessment: Within Functional Limits General Strength Comments: Grossly 4+/5  Care Tool Care Tool Bed Mobility Roll left and right activity   Roll left and right assist level: Supervision/Verbal cueing    Sit to lying activity   Sit to lying assist level: Supervision/Verbal cueing    Lying to sitting on side of bed activity   Lying to sitting on side of bed assist level: the ability to move from lying on the back to sitting on the side of the bed with no back support.: Supervision/Verbal cueing     Care Tool Transfers Sit to stand transfer   Sit to stand assist level: Supervision/Verbal cueing (RW)    Chair/bed transfer   Chair/bed transfer assist level: Supervision/Verbal cueing (RW)     Physiological scientist transfer assist level: Minimal Assistance - Patient > 75% (Verbal cues for sequencing, LE management)      Care Tool Locomotion Ambulation   Assist level: Supervision/Verbal cueing Assistive device: Walker-rolling Max distance: 150'  Walk 10 feet activity   Assist level: Supervision/Verbal cueing Assistive device: Walker-rolling   Walk 50 feet with 2 turns activity   Assist level: Supervision/Verbal cueing Assistive device: Walker-rolling  Walk 150 feet activity   Assist level: Supervision/Verbal cueing Assistive device: Walker-rolling  Walk 10 feet on uneven surfaces activity Walk 10 feet on uneven surfaces activity did not occur: Safety/medical concerns (Fatigue)      Stairs   Assist level: Supervision/Verbal cueing Stairs assistive  device: 2 hand rails Max number of stairs: 4  Walk up/down 1 step activity   Walk up/down 1 step (curb) assist level: Supervision/Verbal cueing Walk up/down 1 step or curb assistive device: 2 hand rails    Walk up/down 4 steps activity Walk up/down 4 steps assist level: Supervision/Verbal cueing Walk up/down 4 steps assistive device: 2 hand rails  Walk up/down 12 steps activity Walk up/down 12 steps activity did not occur: Safety/medical concerns (Fatigue)      Pick up small objects from floor  Pick up small object from the floor (from standing position) activity did not occur: Safety/medical concerns (Fatigue)      Wheelchair Is the patient using a wheelchair?: No          Wheel 50 feet with 2 turns activity      Wheel 150 feet activity        Refer to Care Plan for Long Term Goals  SHORT TERM GOAL WEEK 1 PT Short Term Goal 1 (Week 1): STG = LTG due to LOS  Recommendations for other services: Therapeutic Recreation  Pet therapy, Kitchen group, Stress management, and Outing/community reintegration  Skilled Therapeutic Intervention Evaluation completed (see details above and below) with education on PT POC, CIR 3 hr requirement, goals and individual treatment. Pt received sitting in recliner in room, denied pain and was agreeable to PT. Sit <>stand from recliner to RW requiring two attempts w/supervision, pt told therapist he always does a "false stand" prior to a "real stand" to stretch his legs. Pt ambulated 6' from recliner to bed w/RW and supervision. Pt performed bed mobility w/supervision and donned shoes at EOB w/min A using personal shoe horn. Sit <>stand from low bed to RW and pt ambulated 150' to ortho gym w/supervision. Pt performed car transfer w/ min A, requiring assistance for LE management. Provided education on reclining car seat to allow more space to swing legs into car, as pt has decreased ROM on R side 2/2 previous THA. Pt ambulated 100' and required short seated  rest break prior to ascending/descending 4 steps w/2 rails and step-through pattern w/supervision. Pt then ambulated 100' back to room and was left sitting in recliner w/all needs in reach. Safety plan updated.   Mobility Bed Mobility Bed Mobility: Rolling Right;Rolling Left;Sit to Supine;Supine to Sit Rolling Right: Supervision/verbal cueing Rolling Left: Supervision/Verbal cueing Supine to Sit: Supervision/Verbal cueing Sit to Supine: Supervision/Verbal cueing Transfers Transfers: Sit to Stand;Stand to Sit Sit to Stand: Supervision/Verbal cueing Stand to Sit: Supervision/Verbal cueing Stand Pivot Transfers: Contact Guard/Touching assist Transfer (Assistive device): Rolling walker Locomotion  Gait Ambulation: Yes Gait Assistance: Supervision/Verbal cueing Gait Distance (Feet): 150 Feet Assistive device: Rolling walker Gait Assistance Details: Verbal cues for safe use of DME/AE Gait Gait: Yes Gait Pattern: Impaired Gait Pattern: Decreased step length - left;Decreased step length - right;Decreased stride length;Poor foot clearance - left;Poor foot clearance - right;Trunk flexed Gait velocity: decreased Stairs / Additional Locomotion Stairs: Yes Stairs Assistance: Supervision/Verbal cueing Stair Management Technique: Two rails Number of Stairs: 4 Height of Stairs: 6 Ramp: Supervision/Verbal cueing Curb: Supervision/Verbal cueing Wheelchair Mobility Wheelchair Mobility: No  Discharge Criteria: Patient will be discharged from PT if patient refuses treatment 3 consecutive times without medical reason, if treatment goals not met, if there is a change in medical status, if patient makes no progress towards goals or if patient is discharged from hospital.  The above assessment, treatment plan, treatment alternatives and goals were discussed and mutually agreed upon: by patient  Cruzita Lederer Jasie Meleski, PT, DPT  05/27/2021, 1:02 PM

## 2021-05-27 NOTE — Progress Notes (Signed)
Inpatient Rehabilitation  Patient information reviewed and entered into eRehab system by Lakai Moree M. Tempestt Silba, M.A., CCC/SLP, PPS Coordinator.  Information including medical coding, functional ability and quality indicators will be reviewed and updated through discharge.    

## 2021-05-27 NOTE — Patient Care Conference (Signed)
Inpatient RehabilitationTeam Conference and Plan of Care Update Date: 05/27/2021   Time: 12:13 PM    Patient Name: Gary Rice [facility]      Medical Record Number: [id number]  Date of Birth: 19-Jun-1931 Sex: Male         Room/Bed: 4M02C/4M02C-01 Payor Info: Payor: MEDICARE / Plan: MEDICARE PART A AND B / Product Type: *No Product type* /    Admit Date/Time:  05/26/2021  5:46 PM  Primary Diagnosis:  [place] Hospital Problems: Principal Problem:   Debility    Expected Discharge Date: Expected Discharge Date: 06/03/21  Team Members Present: Physician leading conference: Dr. Alger Simons Social Worker Present: Ovidio Kin, LCSW Nurse Present: Dorien Chihuahua, RN PT Present: Francena Hanly, PT OT Present: Leretha Pol, Lorelee New, OT PPS Coordinator present : Gunnar Fusi, SLP     Current Status/Progress Goal Weekly Team Focus  Bowel/Bladder             Swallow/Nutrition/ Hydration             ADL's             Mobility             Communication             Safety/Cognition/ Behavioral Observations            Pain             Skin               Discharge Planning:  Home with wife who is able to provide supervision level due to her own health issues. Pt hopes to be short length of stay, misses wife   Team Discussion: Patient admitted post complicated hernia repair with abcess. Requires IV abx through 06/02/21. Currently with JP drain with flushes and abd dressing changes. Eager to get home to wife. Demonstrates good safety awareness and reports he will be the person to complete dressing changes at discharge.  Patient on target to meet rehab goals: yes, currently supervision for ADLs and transfers and gait.Goals for discharge set at mod I - supervision with CGA for showering  *See Care Plan and progress notes for long and short-term goals.   Revisions to Treatment Plan:    Teaching Needs: Incision care, medications, dietary modifications, etc  Current  Barriers to Discharge: Home enviroment access/layout, Wound care, and Lack of/limited family support  Possible Resolutions to Barriers: Education     Medical Summary Current Status: s/p pelvic abscess with surgical intervention. on abx thru 9/5. pain controlled. left inguinal hernia with belt  Barriers to Discharge: Medical stability   Possible Resolutions to [employer] Focus: daily assessment of labs, pt data   Continued Need for Acute Rehabilitation Level of Care: The patient requires daily medical management by a physician with specialized training in physical medicine and rehabilitation for the following reasons: Direction of a multidisciplinary physical rehabilitation program to maximize functional independence : Yes Analysis of laboratory values and/or radiology reports with any subsequent need for medication adjustment and/or medical intervention. : Yes   I attest that I was present, lead the team conference, and concur with the assessment and plan of the team.   Margarito Liner 05/27/2021, 2:47 PM

## 2021-05-27 NOTE — Discharge Instructions (Addendum)
Gary Rice [facility] Discharge date and time: 06/03/21   Activities/Precautions/ Functional Status: Activity:  No driving, lifting items over 5 lbs or strenuous activity.  Diet: Carb modified/heart healthy Wound Care: See below s   Functional status:  ___ No restrictions     ___ Walk up steps independently ___ 24/7 supervision/assistance   ___ Walk up steps with assistance _X__ Intermittent supervision/assistance  _X__ Bathe/dress independently ___ Walk with walker     ___ Bathe/dress with assistance ___ Walk Independently    ___ Shower independently ___ Walk with assistance    ___ Shower with assistance _X__ No alcohol     ___ Return to work/school ________  Special Instructions: No driving smoking or alcohol 2. Flush drain with 10 cc sterile saline three times a day. 3. Cleanse abdominal incision normal saline and pack with damp to dry dressing three times a day.      COMMUNITY REFERRALS UPON DISCHARGE:    Home Health:   PT & RN                  Agency: [facility] [place] Phone: [phone]  Medical Equipment/Items Ordered: [place] WITHOUT BACK                                                 Agency/Supplier:[facility]  [phone] Inpatient Rehab Discharge Instructions My questions have been answered and I understand these instructions. I will adhere to these goals and the provided educational materials after my discharge from the hospital.  Patient/Caregiver Signature _______________________________ Date __________  Clinician Signature _______________________________________ Date __________  Please bring this form and your medication list with you to all your follow-up doctor's appointments.    Information on my medicine - ELIQUIS (apixaban)  This medication education was reviewed with me or my healthcare representative as part of my discharge preparation.    Why was Eliquis prescribed for you? Eliquis was prescribed for you to reduce the  risk of a blood clot forming that can cause a stroke if you have a medical condition called atrial fibrillation (a type of irregular heartbeat).  What do You need to know about Eliquis ? Take your Eliquis TWICE DAILY - one tablet in the morning and one tablet in the evening with or without food. If you have difficulty swallowing the tablet whole please discuss with your pharmacist how to take the medication safely.  Take Eliquis exactly as prescribed by your doctor and DO NOT stop taking Eliquis without talking to the doctor who prescribed the medication.  Stopping may increase your risk of developing a stroke.  Refill your prescription before you run out.  After discharge, you should have regular check-up appointments with your healthcare provider that is prescribing your Eliquis.  In the future your dose may need to be changed if your kidney function or weight changes by a significant amount or as you get older.  What do you do if you miss a dose? If you miss a dose, take it as soon as you remember on the same day and resume taking twice daily.  Do not take more than one dose of ELIQUIS at the same time to make up a missed dose.  Important Safety Information A possible side effect of Eliquis is bleeding. You should call your healthcare provider right away if you experience any of  the following: Bleeding from an injury or your nose that does not stop. Unusual colored urine (red or dark brown) or unusual colored stools (red or [place]). Unusual bruising for unknown reasons. A serious fall or if you hit your head (even if there is no bleeding).  Some medicines may interact with Eliquis and might increase your risk of bleeding or clotting while on Eliquis. To help avoid this, consult your healthcare provider or pharmacist prior to using any new prescription or non-prescription medications, including herbals, vitamins, non-steroidal anti-inflammatory drugs (NSAIDs) and supplements.  This  website has more information on Eliquis (apixaban): http://www.eliquis.com/eliquis/home

## 2021-05-27 NOTE — Progress Notes (Signed)
Inpatient Rehabilitation Care Coordinator Assessment and Plan Patient Details  Name: Gary Rice MRN: [id number] Date of Birth: 31-Dec-1930  Today's Date: 05/27/2021  Hospital Problems: Principal Problem:   Debility  Past Medical History:  Past Medical History:  Diagnosis Date   Astigmatism    Basal cell carcinoma    Inguinal hernia    bilateral   Macular degeneration, age related, nonexudative    bilateral   SVT (supraventricular tachycardia) ([place])    Past Surgical History:  Past Surgical History:  Procedure Laterality Date   APPENDECTOMY     age 89   BOWEL RESECTION  05/09/2021   Procedure: SMALL BOWEL RESECTION;  Surgeon: Rolm Bookbinder, MD;  Location: [place];  Service: General;;   CATARACT EXTRACTION, BILATERAL     HERNIA REPAIR     RIH   INGUINAL HERNIA REPAIR Right 05/02/2021   Procedure: REPAIR  INCARCERATED INGUINAL HERNIA  WITH MESH;  Surgeon: Jesusita Oka, MD;  Location: [place];  Service: General;  Laterality: Right;   JOINT REPLACEMENT Right    RTHA   LAPAROSCOPY N/A 05/09/2021   Procedure: LAPAROSCOPY DIAGNOSTIC;  Surgeon: Rolm Bookbinder, MD;  Location: [place];  Service: General;  Laterality: N/A;   LAPAROTOMY  05/09/2021   Procedure: EXPLORATORY LAPAROTOMY;  Surgeon: Rolm Bookbinder, MD;  Location: [place];  Service: General;;   TONSILLECTOMY     Social History:  reports that he quit smoking about 70 years ago. His smoking use included cigarettes. He has never used smokeless tobacco. He reports that he does not drink alcohol and does not use drugs.  Family / Support Systems Marital Status: Married How Long?: 29 years Patient Roles: Spouse, Parent Spouse/Significant Other: Romie Minus [phone] Children: Son in [place] one also deceased Other Supports: Neighbors Anticipated Caregiver: jean Ability/Limitations of Caregiver: Wife has health issues and can only provide supervision level Caregiver Availability: 24/7 Family Dynamics: Close knit with  son who is in [place] and gets here when he can. They have very good nieghbors and they are checking on wife while he is here.  Social History Preferred language: English Religion: Christian Cultural Background: No issues Education: Medical sales representative - How often do you need to have someone help you when you read instructions, pamphlets, or other written material from your doctor or pharmacy?: Never Writes: Yes Employment Status: Retired Public relations account executive Issues: No issues Guardian/Conservator: None-according to MD pt is capable of making his own decisions while here. Very in charge of his care   Abuse/Neglect Abuse/Neglect Assessment Can Be Completed: Yes Physical Abuse: Denies Verbal Abuse: Denies Sexual Abuse: Denies Exploitation of patient/patient's resources: Denies Self-Neglect: Denies  Patient response to: Social Isolation - How often do you feel lonely or isolated from those around you?: Never  Emotional Status Pt's affect, behavior and adjustment status: Pt is motivated to get home, but wants to be ready. He is glad he got to come here for therapies. He has always been independent and wants to get back to this before DC. 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 Recent Psychosocial Issues: other health issues Psychiatric History: No history deferred depression screen due to coping appropriately and verbalizes his wants and needs. Substance Abuse History: No issues  Patient / Family Perceptions, Expectations & Goals Pt/Family understanding of illness & functional limitations: Pt can explain his health issues and reason he is here. He reports he talks with MD daily while rounding and feels he is up to date on  medical issues and plan moving forward. Premorbid pt/family roles/activities: Husband, father, grandfather, retiree, nieghbor, church member, etc Anticipated changes in roles/activities/participation: resume Pt/family expectations/goals: Pt  states: " I want to get home but want to be ready, I miss my wife." Romelle Starcher not able to come up due to health issues  [employer]: None Premorbid Home Care/DME Agencies: Other (Comment) (cane) Transportation available at discharge: Self Is the patient able to respond to transportation needs?: Yes In the past 12 months, has lack of transportation kept you from medical appointments or from getting medications?: No In the past 12 months, has lack of transportation kept you from meetings, work, or from getting things needed for daily living?: No  Discharge Planning Living Arrangements: Spouse/significant other Support Systems: Children, Spouse/significant other, Friends/neighbors, Church/faith community Type of Residence: Private residence Insurance Resources: [employer], Multimedia programmer (specify) Web designer) Financial Resources: [place] Referred: No Living Expenses: Own Money Management: Patient Does the patient have any problems obtaining your medications?: No Home Management: Wife and pt Patient/Family Preliminary Plans: Return home with wife who has health issues and is not able to visit due to this. He really misses her and wants to get home soon. He talks with the MD daily and knows everything going on. Await team evaluations Care Coordinator Barriers to Discharge: Lack of/limited family support Care Coordinator Anticipated Follow Up Needs: HH/OP  Clinical Impression Pleasant in charge gentleman who knows what he wants and does not want any assist when up. He is hoping to be a short length of stay as to get home to his wife who he has not been apart from in 54 years. Will need to be mod/I - supervision to go home. Wife can be there and pt is also the driver. Work on discharge needs and await therapy evaluations.  Elease Hashimoto 05/27/2021, 10:46 AM

## 2021-05-28 DIAGNOSIS — E871 Hypo-osmolality and hyponatremia: Secondary | ICD-10-CM | POA: Diagnosis not present

## 2021-05-28 DIAGNOSIS — D62 Acute posthemorrhagic anemia: Secondary | ICD-10-CM

## 2021-05-28 DIAGNOSIS — Z8679 Personal history of other diseases of the circulatory system: Secondary | ICD-10-CM

## 2021-05-28 DIAGNOSIS — R5381 Other malaise: Secondary | ICD-10-CM | POA: Diagnosis not present

## 2021-05-28 MED ORDER — PANTOPRAZOLE SODIUM 40 MG PO TBEC
40.0000 mg | DELAYED_RELEASE_TABLET | Freq: Every day | ORAL | Status: DC
  Administered 2021-05-29 – 2021-06-04 (×7): 40 mg via ORAL
  Filled 2021-05-28 (×7): qty 1

## 2021-05-28 MED ORDER — SODIUM CHLORIDE (PF) 0.9 % IJ SOLN
INTRAMUSCULAR | Status: AC
  Administered 2021-05-28: 1 mL
  Filled 2021-05-28: qty 10

## 2021-05-28 MED ORDER — SODIUM CHLORIDE 0.9 % IV SOLN
INTRAVENOUS | Status: DC | PRN
  Administered 2021-05-28: 152 mL via INTRAVENOUS
  Administered 2021-05-29: 250 mL via INTRAVENOUS
  Administered 2021-05-30: 225 mL via INTRAVENOUS

## 2021-05-28 NOTE — Progress Notes (Signed)
PROGRESS NOTE   Subjective/Complaints: Patient seen laying in bed this morning.  He states he slept well overnight.  He states therapies are going well.  He states he anticipates he will be ready for discharge on Tuesday.  ROS: Denies CP, SOB, N/V/D  Objective:   No results found. Recent Labs    05/26/21 0500 05/27/21 0500  WBC 14.3* 13.4*  HGB 12.2* 11.5*  HCT 36.2* 34.9*  PLT 404* 401*    Recent Labs    05/26/21 0500 05/27/21 0500  NA 134* 134*  K 4.3 3.9  CL 99 100  CO2 28 27  GLUCOSE 113* 109*  BUN 17 20  CREATININE 0.85 0.75  CALCIUM 9.4 8.9     Intake/Output Summary (Last 24 hours) at 05/28/2021 1048 Last data filed at 05/28/2021 0755 Gross per 24 hour  Intake 630 ml  Output 615 ml  Net 15 ml         Physical Exam: Vital Signs Blood pressure 110/70, pulse 83, temperature 98 F (36.7 C), resp. rate 20, height '5\' 11"'$  (1.803 m), weight 75.9 kg, SpO2 94 %. Constitutional: No distress . Vital signs reviewed. HENT: Normocephalic.  Atraumatic. Eyes: EOMI. No discharge. Cardiovascular: No JVD.  RRR. Respiratory: Normal effort.  No stridor.  Bilateral clear to auscultation. GI: Non-distended.  BS +. Skin: Warm and dry.  Incision CDI. Psych: Normal mood.  Normal behavior. Musc: No edema in extremities.  No tenderness in extremities. Neuro: Alert and oriented Motor: Grossly 4+/5 throughout   Assessment/Plan: 1. Functional deficits which require 3+ hours per day of interdisciplinary therapy in a comprehensive inpatient rehab setting. Physiatrist is providing close team supervision and 24 hour management of active medical problems listed below. Physiatrist and rehab team continue to assess barriers to discharge/monitor patient progress toward functional and medical goals  Care Tool:  Bathing    Body parts bathed by patient: Right arm, Left arm, Chest, Abdomen, Buttocks, Right upper leg, Left upper  leg, Right lower leg, Left lower leg, Face     Body parts n/a: Front perineal area   Bathing assist Assist Level: Contact Guard/Touching assist     Upper Body Dressing/Undressing Upper body dressing   What is the patient wearing?: Pull over shirt    Upper body assist Assist Level: Set up assist    Lower Body Dressing/Undressing Lower body dressing      What is the patient wearing?: Pants     Lower body assist Assist for lower body dressing: Supervision/Verbal cueing     Toileting Toileting    Toileting assist Assist for toileting: Independent with assistive device Assistive Device Comment: urinal   Transfers Chair/bed transfer  Transfers assist     Chair/bed transfer assist level: Supervision/Verbal cueing (RW)     Locomotion Ambulation   Ambulation assist      Assist level: Supervision/Verbal cueing Assistive device: Walker-rolling Max distance: 150'   Walk 10 feet activity   Assist     Assist level: Supervision/Verbal cueing Assistive device: Walker-rolling   Walk 50 feet activity   Assist    Assist level: Supervision/Verbal cueing Assistive device: Walker-rolling    Walk 150 feet activity   Assist  Assist level: Supervision/Verbal cueing Assistive device: Walker-rolling    Walk 10 feet on uneven surface  activity   Assist Walk 10 feet on uneven surfaces activity did not occur: Safety/medical concerns (Fatigue)         Wheelchair     Assist Is the patient using a wheelchair?: No             Wheelchair 50 feet with 2 turns activity    Assist            Wheelchair 150 feet activity     Assist          Blood pressure 110/70, pulse 83, temperature 98 F (36.7 C), resp. rate 20, height '5\' 11"'$  (1.803 m), weight 75.9 kg, SpO2 94 %.  Medical Problem List and Plan: 1.  Debility secondary to pelvic abscess  Continue CIR 2.  Antithrombotics: -DVT/anticoagulation:  Pharmaceutical: Other (comment)  Eliquis started   -antiplatelet therapy: N/A 3. Post-operative pain:   tylenol  325 mg tid for pain.    4. Mood: LCSW to follow for evaluation and support.              -antipsychotic agents: N/A 5. Neuropsych: This patient is capable of making decisions on his own behalf. 6. Skin/Wound Care: Dressing changes to midline wound.              --Continue ensure TID to promote wound healing 7. Fluids/Electrolytes/Nutrition: good appetite 8. Cholecystitis:   LFTs elevated, but improving on 8/30 --Decreased tylenol to 325 mg TID.  --Continue meropenem X 2 weeks w/end date 09/05 9. Pelvic abscess a/p drain placement: Cultures +Kleb oxytoca--> meropenem (S) thorough --monitor for SE/tolerance of antibiotics  10.  Mild hyponatremia:   Sodium 134 on 8/30 11. Leucocytosis:  WBC 13.4 on 8/30 Continue to monitor Continue to monitor for fevers and other signs of infection.  12. Low calorie malnutrition: Alb-2.2. added juven additionally to help promote wound healing.   -encouraged him to keep eating as he's been  13.  Pre-diabetes: Hgb A1C-5.8. Fasting BS reasonable. Changed Ensure to Ensure Max but pt doesn't like them.  Monitor with increased exertion 14. H/o  SVT/New onset AFib: Monitor HR TID--continue Diltiazem 120 mg TID with Eliquis bid             --off statin due to Abnormal LFTs.   Controlled on 8/31 15. Acute blood loss anemia: added MVI with iron supplement   Hemoglobin 11.5 on 8/30  Continue to monitor   LOS: 2 days A FACE TO FACE EVALUATION WAS PERFORMED  Vivika Poythress Lorie Phenix 05/28/2021, 10:48 AM

## 2021-05-28 NOTE — Progress Notes (Signed)
Physical Therapy Session Note  Patient Details  Name: Gary Rice MRN: [id number] Date of Birth: 04-25-1931  Today's Date: 05/28/2021 PT Individual Time: 1100-1155 PT Individual Time Calculation (min): 55 min   Short Term Goals: Week 1:  PT Short Term Goal 1 (Week 1): STG = LTG due to LOS  Skilled Therapeutic Interventions/Progress Updates:   Received pt semi-reclined in bed, pt agreeable to PT treatment, and denied any pain during session but reported fatigue. Session with emphasis on functional mobility/transfers, generalized strengthening, dynamic standing balance/coordination, gait training, and improved activity tolerance. Pt performed bed mobility with supervision with HOB elevated and donned shoes with set up assist using shoe horn. Sit<>stands with RW and supervision throughout session and ambulated 138f with RW and supervision to main therapy gym. Extensive time spent discussing DME. Pt reported using a SPC at home prior to admission but feeling like he wasn't at a point where he could use it right now. Pt reported not liking RW; therefore educated pt on benefits of rollator and introduced it to pt. Pt ambulated 1937fwith rollator and supervision with min cues for brake management ut overall good safety awareness. Pt then reported not liking the rollator either stating multiple times "It's just not me". Therapist encouraged gait training with SPC but pt declined stating "I'm not there yet". Ambulated additional [id number] and [id number] using RW and supervision and worked on dynamic standing balance inside // bars performing lateral side stepping x 4 laps without UE support and CGA/supervision and forward walking x 4 laps without UE support and supervision. Used this activity as a way to demonstrate to pt that he could begin gait training with SPC, however pt continued to decline. Concluded session with pt sitting in recliner, needs within reach, and chair pad alarm on.   Therapy  Documentation Precautions:  Precautions Precautions: Fall Precaution Comments: JP drain R abdomen Restrictions Weight Bearing Restrictions: No  Therapy/Group: Individual Therapy AnnaRaquel Vanson DPT   05/28/2021, 7:45 AM

## 2021-05-28 NOTE — Progress Notes (Signed)
Occupational Therapy Session Note  Patient Details  Name: Gary Rice MRN: [id number] Date of Birth: [birth year]  Today's Date: 05/28/2021 OT Individual Time: [id number] OT Individual Time Calculation (min): 61 min    Short Term Goals: Week 1:  OT Short Term Goal 1 (Week 1): STGs=LTGs due to ELOS  Skilled Therapeutic Interventions/Progress Updates:  Pt greeted supine in bed agreeable to OT intervention. Session focus on  BADL reedcation and increasing activity tolerance through BLE strength and endurance therapeutic activies. Overall pt required supervision fro bed mobility and set- up assist for UB/LB dressing. Pt completed ambulatory toilet transfer with Rw and CGA, supervision for 3/3 toieting tasks. Pt completed standing groomign at sink with supervision. Remainder of session to focus on toe taps to cones in standing with gross supervision for 2 mins, upgraded task with pt completing reciprocal stair stepping fror 2 mins with Rw and S. VSS. Pt additionally completed household distance functional mobility with Rw and gross supervision to ADL apartment where pt completed bed mobility to flat [facility] with supervision, pt declined practicing functional transfer to couch or recliner. Pt completed functional mobility back to room with gross supervision. Where pt left supine in bed with all needs within reach and bed alarm activated.                 Therapy Documentation Precautions:  Precautions Precautions: Fall Precaution Comments: JP drain R abdomen Restrictions Weight Bearing Restrictions: No    Pain: Pt reports no pain during session.     Therapy/Group: Individual Therapy  Corinne Ports [facility] 05/28/2021, 7:46 AM

## 2021-05-28 NOTE — Progress Notes (Signed)
Patient ID: Gary Rice, male   DOB: 12/26/1930, 87 y.o.   MRN: [id number]  Met with pt to discuss team conference goals independent with device and discharge 9/6. He feels he is doing well and will be ready to go home. He called his wife and let he know the date and she was excited. Discussed equipment needs-rolling walker and tub seat. Made aware tub seat is not covered he reports they will pay for it. Have made referral via [employer]. Will confirm home health and if going home with drain. Work on discharge plans.

## 2021-05-28 NOTE — Progress Notes (Signed)
Physical Therapy Session Note  Patient Details  Name: Gary Rice MRN: [id number] Date of Birth: 1930/11/03  Today's Date: 05/28/2021 PT Individual Time: 1503-1600 PT Individual Time Calculation (min): 57 min   Short Term Goals: Week 1:  PT Short Term Goal 1 (Week 1): STG = LTG due to LOS  Skilled Therapeutic Interventions/Progress Updates:    Patient in supine and reports no pain.  States tried rollator earlier, but willing to try with less support.  Supine to sit with S and stood to RW with CGA.  Ambulated with S to therapy gym stopping to change walkers at storage closet due to walker legs scraping at times.  Patient ambulated on wall rail x 60 switching sides to turn.  Noted L hip trendelenberg, but no change in LE stability holding rail with R or L hand.  Patient performed side stepping at rail with 1 UE support and S.  In parallel bars performed side stepping over cones with UE support 4 x length of the bars with S to CGA, then forward gait over cones step over step with rails and S 4 x length of the bars.  Patient negotiated 16 steps with rails and close S to CGA step through pattern, noting some weakness with stepping up with L LE but no LOB.  Patient with HR 101, SpO2 98% after stairs.  Performed standing balance without UE support using PVC pipe for designs x 3 with S.  Patient ambulated back to room with RW and S.  Discussed plans for d/c and pt plans to continue to use RW.  Patient with seated rest breaks throughout session, but noted no SOB.  Patient left seated EOB with bed alarm active and call light in reach after reviewing bag of items wife sent.  Patient using clippers to clip fingernails.  RN aware to check on pt due to wife sending supplement pt wants to start taking again.   Therapy Documentation Precautions:  Precautions Precautions: Fall Precaution Comments: JP drain R abdomen Restrictions Weight Bearing Restrictions: No    Pain: Pain Assessment Pain Score: 0-No  pain    Therapy/Group: Individual Therapy  Reginia Naas [place], PT 05/28/2021, 1:00 PM

## 2021-05-29 LAB — COMPREHENSIVE METABOLIC PANEL
ALT: 55 U/L — ABNORMAL HIGH (ref 0–44)
AST: 23 U/L (ref 15–41)
Albumin: 2.3 g/dL — ABNORMAL LOW (ref 3.5–5.0)
Alkaline Phosphatase: 177 U/L — ABNORMAL HIGH (ref 38–126)
Anion gap: 7 (ref 5–15)
BUN: 19 mg/dL (ref 8–23)
CO2: 28 mmol/L (ref 22–32)
Calcium: 8.8 mg/dL — ABNORMAL LOW (ref 8.9–10.3)
Chloride: 99 mmol/L (ref 98–111)
Creatinine, Ser: 0.8 mg/dL (ref 0.61–1.24)
GFR, Estimated: >60 mL/min (ref >60)
Glucose, Bld: 109 mg/dL — ABNORMAL HIGH (ref 70–99)
Potassium: 3.9 mmol/L (ref 3.5–5.1)
Sodium: 134 mmol/L — ABNORMAL LOW (ref 135–145)
Total Bilirubin: 1.1 mg/dL (ref 0.3–1.2)
Total Protein: 6.3 g/dL — ABNORMAL LOW (ref 6.5–8.1)

## 2021-05-29 NOTE — Progress Notes (Addendum)
Recreational Therapy Assessment and Plan  Patient Details  Name: Gary Rice MRN: [id number] Date of Birth: [birth year] Today's Date: 05/29/2021  Rehab Potential:  Good ELOS:   D/c 9/6  Assessment  Hospital Problem: Principal Problem:   Debility     Past Medical History:      Past Medical History:  Diagnosis Date   Astigmatism     Basal cell carcinoma     Inguinal hernia      bilateral   Macular degeneration, age related, nonexudative      bilateral   SVT (supraventricular tachycardia) ([place])      Past Surgical History:       Past Surgical History:  Procedure Laterality Date   APPENDECTOMY        age 79   BOWEL RESECTION   05/09/2021    Procedure: SMALL BOWEL RESECTION;  Surgeon: Rolm Bookbinder, MD;  Location: [place];  Service: General;;   CATARACT EXTRACTION, BILATERAL       HERNIA REPAIR        RIH   INGUINAL HERNIA REPAIR Right 05/02/2021    Procedure: REPAIR  INCARCERATED INGUINAL HERNIA  WITH MESH;  Surgeon: Jesusita Oka, MD;  Location: [place];  Service: General;  Laterality: Right;   JOINT REPLACEMENT Right      RTHA   LAPAROSCOPY N/A 05/09/2021    Procedure: LAPAROSCOPY DIAGNOSTIC;  Surgeon: Rolm Bookbinder, MD;  Location: [place];  Service: General;  Laterality: N/A;   LAPAROTOMY   05/09/2021    Procedure: EXPLORATORY LAPAROTOMY;  Surgeon: Rolm Bookbinder, MD;  Location: [place];  Service: General;;   TONSILLECTOMY          Assessment & Plan Clinical Impression: Patient is a 87 y.o. year old male with history of SVT, bilateral inginal hernias who was admitted on 05/02/21 with vomiting due to incarcerated hernia w/SBO as well as new onset of Afib w flutter. He was started on IV diltiazem for rate control per Dr. Rudi Rummage with recommendations to start Eliquis once cleared and was taken to OR emergently for repair of incarcerated hernia by Dr. Bobbye Morton the same day.  He was also found to have incidental ascending aortic aneurysm and to follow up with Dr.  Etter Sjogren with f/u in A fib clinic per Dr. Rayann Heman. Hospital course significant for increase in abdominal distension once NGT removed due to SBO with microperforation and was taken to OR 08/12 for open small bowel resection by Dr. Donne Hazel. He was maintained on TNA for nutritional support He started developing rise in WBC on 08/15 due to development of pelvic abscess which was drained and pelvic drain placed by Dr. Dwaine Gale on 08/16.  Cultures grew out Klebsiella Oxytoca/Bacteriodes Fragilis which was initially treated with Zosyn (R).   ID consulted for input  and antibiotics changed to Meropenum. He developed rise in LFTs due to cholecystitis with cholelithiasis and follow up CT showed significant decrease in size abscess. Dr. Paulita Fujita consulted for input due to dilated CBD and recommended MRCP which showed acute cholecystitis but was negative for choledocholithiasis without significant CBD dilatation or obstructing liver mass.  GI/General surgery recommends conservative care with antibiotics for treatment as LFTs improving and patient tolerating diet with pain or N/V. If patient develops symptoms may need perc chole drain. He did develop rash on 08/25 --likely drug eruption which is being monitored (Cipro/Bactrim less ideal given age) with benadryl prn itching.  Midline incision healing, he is tolerating po's with + BM but continues to have  limitations in mobility and ability to carry out ADLs. CIR recommended due to functional decline.  He is eager to return home as soon as physically able- worried that his wife is home alone.   Pt presents with decreased activity tolerance, decreased functional mobility, decreased balance Limiting pt's independence with leisure/community pursuits.   Plan  Min 1 TR session for stress managment  Recommendations for other services: None   Discharge Criteria: Patient will be discharged from TR if patient refuses treatment 3 consecutive times without medical reason.  If treatment  goals not met, if there is a change in medical status, if patient makes no progress towards goals or if patient is discharged from hospital.  The above assessment, treatment plan, treatment alternatives and goals were discussed and mutually agreed upon: by patient  Session Note:    No c/o pain. Pt referred by team for participation in stress management group.  Pt participated in group education/discussion identifying factors that contribute to stress, factors that protect against stress & coping strategies (deep breathing, imagery, progressive muscle relaxation & challenging irrational thoughts)  Pt provided with handouts on the above and appreciative of this group and education received.  No further TR as pt is discharging 9/6  Gary Rice 05/29/2021, 3:58 PM

## 2021-05-29 NOTE — Progress Notes (Signed)
Occupational Therapy Session Note  Patient Details  Name: Gary Rice MRN: [id number] Date of Birth: 12-09-1930  Today's Date: 05/29/2021 OT Individual Time: 1100-1200 OT Individual Time Calculation (min): 60 min    Short Term Goals: Week 1:  OT Short Term Goal 1 (Week 1): STGs=LTGs due to ELOS  Skilled Therapeutic Interventions/Progress Updates:    Pt semi reclined in bed, reports feeling tired after previous PT session, but happy to be working hard in order to get better.  Bed mobility completed with mod I. Donned shoes using shoe horn with setup.  Pt completed all other functional mobility during this session with distant supervision using single point cane.  Pt stood at sink to complete oral care and face hygiene with setup.  Then requesting to use toilet and ambulated to bathroom and completed stand to sit using grab bar.  Pt had continent episode of bowel and bladder.  Pericare and clothing mgt completed with distant supervision.    Pt then ambulated to ADL kitchen approximately 75 feet with small amount of shortness of breath due to impaired endurance.  Therapist needing to encourage pt to take short seated rest break while discussing his kitchen layout and meal prep routine.  Pt reports mostly prepares microwaved meals but once a week cooks a pizza with chopping preparation required.  Pt able to retrieve microwave meal from freezer and place on plate then simulate preparing in microwave by following written directions with distant supervision.  Pt also filled coffee pot with water and described sequence of steps to make coffee in home appliance.  Pt ambulated back to room and sat in recliner at end of session. Call bell in reach, seat alarm on.  Therapy Documentation Precautions:  Precautions Precautions: Fall Precaution Comments: JP drain R abdomen Restrictions Weight Bearing Restrictions: No    Therapy/Group: Individual Therapy  Ezekiel Slocumb 05/29/2021, 12:37 PM

## 2021-05-29 NOTE — Progress Notes (Signed)
Pt rested fairly well. Denies pain. Encouraged to turn and reposition throughout the night. Pt reports able to turn self. Sacrum red, blanchable, barrier cream applied and foam dressing replaced. Gauze to drain site on right buttock changed as well.

## 2021-05-29 NOTE — IPOC Note (Signed)
Overall Plan of Care [facility]) Patient Details Name: Gary Rice MRN: [id number] DOB: 1930/12/13  Admitting Diagnosis: [facility] Problems: Principal Problem:   Debility Active Problems:   Acute blood loss anemia   History of supraventricular tachycardia   Hyponatremia     Functional Problem List: Nursing Endurance, Pain, Safety, Medication Management, Skin Integrity, Nutrition, Bowel  PT Endurance, Skin Integrity, Safety  OT Balance, Endurance, Motor  SLP    TR         Basic ADL's: OT Grooming, Bathing, Dressing, Toileting     Advanced  ADL's: OT       Transfers: PT Bed Mobility, Bed to Chair, Car, Manufacturing systems engineer, Metallurgist: PT Ambulation, Stairs     Additional Impairments: OT    SLP        TR      Anticipated Outcomes Item Anticipated Outcome  Self Feeding    Swallowing      Basic self-care  mod I  Toileting  mod I   Bathroom Transfers mod I  Bowel/Bladder  mod I assist  Transfers  Mod I  Locomotion  Mod I  Communication     Cognition     Pain  at or below level 4  Safety/Judgment  maintain w cues   Therapy Plan: PT Intensity: Minimum of 1-2 x/day ,45 to 90 minutes PT Frequency: 5 out of 7 days PT Duration Estimated Length of Stay: 7-10 days OT Intensity: Minimum of 1-2 x/day, 45 to 90 minutes OT Frequency: 5 out of 7 days OT Duration/Estimated Length of Stay: 7-10 days     Due to the current state of emergency, patients may not be receiving their 3-hours of Medicare-mandated therapy.   Team Interventions: Nursing Interventions Bowel Management, Pain Management, Discharge Planning, Medication Management, Disease Management/Prevention, Patient/Family Education  PT interventions Ambulation/gait training, Community reintegration, DME/adaptive equipment instruction, Psychosocial support, Neuromuscular re-education, Stair training, UE/LE Strength taining/ROM, Training and development officer, Discharge  planning, Functional electrical stimulation, Pain management, Skin care/wound management, Therapeutic Activities, UE/LE Coordination activities, Cognitive remediation/compensation, Disease management/prevention, Functional mobility training, Patient/family education, Therapeutic Exercise  OT Interventions Balance/vestibular training, Discharge planning, Self Care/advanced ADL retraining, Therapeutic Activities, UE/LE Coordination activities, Disease mangement/prevention, Functional mobility training, Patient/family education, Therapeutic Exercise, Visual/perceptual remediation/compensation, Skin care/wound managment, DME/adaptive equipment instruction, Neuromuscular re-education, Community reintegration, Psychosocial support, UE/LE Strength taining/ROM  SLP Interventions    TR Interventions    SW/CM Interventions Discharge Planning, Psychosocial Support, Patient/Family Education   Barriers to Discharge MD  Medical stability  Nursing Decreased caregiver support, Home environment access/layout, IV antibiotics, New diabetic, Wound Care, Nutrition means 2 level 11 ste right rail w spouse  PT Inaccessible home environment, Decreased caregiver support, Home environment access/layout, Wound Care, Lack of/limited family support Stairs inside home, wife incapable of providing assistance  OT Inaccessible home environment, Decreased caregiver support, IV antibiotics    SLP      SW Lack of/limited family support     Team Discharge Planning: Destination: PT-Home ,OT- Home , SLP-  Projected Follow-up: PT-Home health PT, OT-  Home health OT, SLP-  Projected Equipment Needs: PT-To be determined, OT- To be determined, SLP-  Equipment Details: PT- , OT-Pt has bedside commode from previous hip surgery; also has a single point cane Patient/family involved in discharge planning: PT- Patient,  OT-Patient, SLP-   MD ELOS: 7 days Medical Rehab Prognosis:  Excellent Assessment: The patient has been admitted for  CIR therapies with the diagnosis of 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 The team will be addressing functional mobility, strength, stamina, balance, safety, adaptive techniques and equipment, self-care, bowel and bladder mgt, patient and caregiver education, community reentry. Goals have been set at mod I for mobility and self-care.   Due to the current state of emergency, patients may not be receiving their 3 hours per day of Medicare-mandated therapy.    Meredith Staggers, MD, FAAPMR     See Team Conference Notes for weekly updates to the plan of care

## 2021-05-29 NOTE — Progress Notes (Signed)
Physical Therapy Session Note  Patient Details  Name: Gary Rice MRN: [id number] Date of Birth: [birth year]  Today's Date: 05/29/2021 PT Individual Time: [id number] PT Individual Time Calculation (min): 71 min   Short Term Goals: Week 1:  PT Short Term Goal 1 (Week 1): STG = LTG due to LOS  Skilled Therapeutic Interventions/Progress Updates:  Pt received supine in bed, denied pain and agreeable to PT. Pt performed bed mobility w/mod I and dressed UE and LE at EOB w/set-up assist. Pt donned shoes at EOB w/set-up assist. Pt verbalized dissatisfaction w/use of rollator day prior, educated pt on purpose of challenging balance and transitioning back to walking w/SPC, pt adamant that a rollator was "not for him" and "unnecessary". Sit <>stand throughout session w/supervision. Pt ambulated >500' from room to 1st floor patio w/RW and supervision, noted slow cadence and kyphotic posture. Once outside, pt began ambulating w/SPC on R side and ambulated >300' on grass, concrete and incline/decline w/supervision and numerous seated rest breaks to challenge ankle strategy an improve cardiovascular endurance. Pt ambulated from 1st floor patio to room w/SPC and supervision, noted increased kyphosis as pt fatigued, verbal cues to avoid looking down at feet. Once in room, pt reported SOB, SpO2 at 90%. Educated pt on diaphragmatic breathing, SpO2 at 97% after 2 minutes. Pt performed bed mobility mod I and was left supine in bed, all needs in reach.   Therapy Documentation Precautions:  Precautions Precautions: Fall Precaution Comments: JP drain R abdomen Restrictions Weight Bearing Restrictions: No   Therapy/Group: Individual Therapy Cruzita Lederer Skyelar Halliday, PT, DPT  05/29/2021, 7:45 AM

## 2021-05-29 NOTE — Progress Notes (Signed)
Patient wife brought some things from home. Patient brought to my attention that his wife brought a supplement from home that he usually take called preservision areds 2 formula. Patient asked if he could take it. On call PA notified. PA said it was ok. Idamae Schuller, LPN

## 2021-05-29 NOTE — Progress Notes (Signed)
PROGRESS NOTE   Subjective/Complaints: Doing well. Pain controlled. Excited about dc on Tuesday!  ROS: Patient denies fever, rash, sore throat, blurred vision, nausea, vomiting, diarrhea, cough, shortness of breath or chest pain, joint or back pain, headache, or mood change.   Objective:   No results found. Recent Labs    05/27/21 0500  WBC 13.4*  HGB 11.5*  HCT 34.9*  PLT 401*   Recent Labs    05/27/21 0500 05/29/21 0446  NA 134* 134*  K 3.9 3.9  CL 100 99  CO2 27 28  GLUCOSE 109* 109*  BUN 20 19  CREATININE 0.75 0.80  CALCIUM 8.9 8.8*    Intake/Output Summary (Last 24 hours) at 05/29/2021 0909 Last data filed at 05/29/2021 0829 Gross per 24 hour  Intake 530 ml  Output 1535 ml  Net -1005 ml        Physical Exam: Vital Signs Blood pressure 119/73, pulse 86, temperature 98.5 F (36.9 C), resp. rate 18, height '5\' 11"'$  (1.803 m), weight 75.9 kg, SpO2 97 %. Constitutional: No distress . Vital signs reviewed. HEENT: NCAT, EOMI, oral membranes moist Neck: supple Cardiovascular: RRR without murmur. No JVD    Respiratory/Chest: CTA Bilaterally without wheezes or rales. Normal effort    GI/Abdomen: BS +, non-tender, non-distended Ext: no clubbing, cyanosis, or edema Psych: pleasant and cooperative  Skin: Warm and dry.  Incision CDI. JP drain 10+cc fluid  . Musc: No edema in extremities.  No tenderness in extremities. Neuro: Alert and oriented Motor: Grossly 4+/5 throughout   Assessment/Plan: 1. Functional deficits which require 3+ hours per day of interdisciplinary therapy in a comprehensive inpatient rehab setting. Physiatrist is providing close team supervision and 24 hour management of active medical problems listed below. Physiatrist and rehab team continue to assess barriers to discharge/monitor patient progress toward functional and medical goals  Care Tool:  Bathing    Body parts bathed by patient:  Right arm, Left arm, Chest, Abdomen, Buttocks, Right upper leg, Left upper leg, Right lower leg, Left lower leg, Face     Body parts n/a: Front perineal area   Bathing assist Assist Level: Contact Guard/Touching assist     Upper Body Dressing/Undressing Upper body dressing   What is the patient wearing?: Pull over shirt    Upper body assist Assist Level: Set up assist    Lower Body Dressing/Undressing Lower body dressing      What is the patient wearing?: Pants     Lower body assist Assist for lower body dressing: Set up assist     Toileting Toileting    Toileting assist Assist for toileting: Independent Assistive Device Comment: urinal   Transfers Chair/bed transfer  Transfers assist     Chair/bed transfer assist level: Contact Guard/Touching assist     Locomotion Ambulation   Ambulation assist      Assist level: Supervision/Verbal cueing Assistive device: Walker-rolling Max distance: 150'   Walk 10 feet activity   Assist     Assist level: Supervision/Verbal cueing Assistive device: Walker-rolling   Walk 50 feet activity   Assist    Assist level: Supervision/Verbal cueing Assistive device: Walker-rolling    Walk 150 feet activity  Assist    Assist level: Supervision/Verbal cueing Assistive device: Walker-rolling    Walk 10 feet on uneven surface  activity   Assist Walk 10 feet on uneven surfaces activity did not occur: Safety/medical concerns (Fatigue)         Wheelchair     Assist Is the patient using a wheelchair?: No             Wheelchair 50 feet with 2 turns activity    Assist            Wheelchair 150 feet activity     Assist          Blood pressure 119/73, pulse 86, temperature 98.5 F (36.9 C), resp. rate 18, height '5\' 11"'$  (1.803 m), weight 75.9 kg, SpO2 97 %.  Medical Problem List and Plan: 1.  Debility secondary to pelvic abscess  -Continue CIR therapies including PT, OT  2.   Antithrombotics: -DVT/anticoagulation:  Pharmaceutical: Other (comment) Eliquis started   -antiplatelet therapy: N/A 3. Post-operative pain:   tylenol  325 mg tid for pain.    4. Mood: LCSW to follow for evaluation and support.              -antipsychotic agents: N/A 5. Neuropsych: This patient is capable of making decisions on his own behalf. 6. Skin/Wound Care: Dressing changes to midline wound.              --Continue ensure TID to promote wound healing 7. Fluids/Electrolytes/Nutrition: good appetite 8. Cholecystitis:   LFTs nearly normal 9/1 --Decreased tylenol to 325 mg TID.  --Continue meropenem X 2 weeks w/end date 09/05 9. Pelvic abscess a/p drain placement: Cultures +Kleb oxytoca--> meropenem (S) thorough  10.  Mild hyponatremia:   Sodium 134 on 9/1 11. Leucocytosis:  WBC 13.4 on 8/30  Continue to monitor for fevers and other signs of infection.  12. Low calorie malnutrition: Alb-2.2. added juven additionally to help promote wound healing.   -eating well  13.  Pre-diabetes: Hgb A1C-5.8. Fasting BS reasonable. Changed Ensure to Ensure Max but pt doesn't like them.  Monitor with increased exertion 14. H/o  SVT/New onset AFib: Monitor HR TID--continue Diltiazem 120 mg TID with Eliquis bid             --off statin due to Abnormal LFTs.   Controlled on 9/1 15. Acute blood loss anemia: added MVI with iron supplement   Hemoglobin 11.5 on 8/30  Continue to monitor   LOS: 3 days A FACE TO FACE EVALUATION WAS PERFORMED  Gary Rice 05/29/2021, 9:09 AM

## 2021-05-29 NOTE — Progress Notes (Signed)
Occupational Therapy Session Note  Patient Details  Name: Gary Rice MRN: [id number] Date of Birth: Nov 25, 1930  Today's Date: 05/29/2021 OT Group Time: 1330-1430 OT Group Time Calculation (min): 60 min   Short Term Goals: Week 1:  OT Short Term Goal 1 (Week 1): STGs=LTGs due to ELOS  Skilled Therapeutic Interventions/Progress Updates:  Pt participated in group session with a focus on stress mgmt, education on healthy coping strategies, and social interaction.  Session focus on breaking down stressors into "daily hassles," "major life stressors" and "life circumstances" in an effort to allow pts to chunk their stressors into groups and rank them on a scale of 1-10. Pt actively sharing stressors and contributing to group conversation. Offered education on factors that protect [place] against stress such as "daily uplifts," "healthy coping strategies" and "protective factors." Encouraged all group members to make an effort to actively recall one event from their day that was a daily uplift in an effort to protect their mindset from stressors. Issued pt handouts on healthy coping strategies to implement into routine. Pt transported back to room by RT.  Therapy Documentation Precautions:  Precautions Precautions: Fall Precaution Comments: JP drain R abdomen Restrictions Weight Bearing Restrictions: No  Pain: Pt reports no pain during group session.    Therapy/Group: Group Therapy  Precious Haws 05/29/2021, 2:54 PM

## 2021-05-30 DIAGNOSIS — K810 Acute cholecystitis: Secondary | ICD-10-CM

## 2021-05-30 NOTE — Progress Notes (Signed)
Physical Therapy Session Note  Patient Details  Name: Redmond Clare MRN: [id number] Date of Birth: 1931-02-19  Today's Date: 05/30/2021 PT Individual Time: [id number]; [id number] PT Individual Time Calculation (min): 57 min and 58 min  Short Term Goals: Week 1:  PT Short Term Goal 1 (Week 1): STG = LTG due to LOS  Skilled Therapeutic Interventions/Progress Updates:  Session 1 Pt received sitting in recliner, denied pain and was agreeable to PT. Emphasis of session on cardiovascular conditioning, nursing present to administer meds. Pt performed sit <>stand transfers w/BUE support on armrests throughout session w/supervision. Pt ambulated 36' w/SPC on R side and supervision. Noted decreased kyphosis and improved step length/clearance bilaterally.   Interval training on NuStep Level 6, 4 min on/48mn off w/emphasis on speed, consistency and O2 reserve:  -Rd 1: 223 steps, SpO2 98%, HR 92 bpm, RPE 13  -Rd 2: 248 steps, SpO2 99%, HR 104 bpm, RPE 14 -Rd 3: 236 steps, SpO2 98%, HR 109 bpm, RPE 15   Sit <>stand from NuStep and pt ambulated 5' w/SPC w/supervision . 372sSTS x2 for LE strength without AD w/BUE support. First attempt pt achieved 4 reps, second attempt pt achieved 5 reps completed w/supervision. Sit <>stand using armrests and pt ambulated 777 w/SPC on R side w/supervision. Pt performed bed mobility w/mod I and was left supine in bed, all needs in reach.   Session 2  Pt received sitting in recliner in room, NT present for vitals. Pt denied pain and was agreeable to PT. Sit <>stand from recliner using armrest and pt ambulated 742 w/SPC on R side w/supervision. BITS bell cancellation activity x10 minutes while standing on Biodex foam for improved ankle strategy and stability while reaching across midline, supervision for safety.   Circuit training for improved LE strength, coordination and posture: -Thrusters w/5# weighted bar x15 w/supervision, verbal cues for anterior weight  shift. -Staggered stance deadlifts w/5# bar w/bent over row, x20 per side. Multimodal cues for scapular retraction and hip hinge   Pt ambulated 150' w/ no AD and supervision for safety, noted increased arm swing bilaterally, decreased kyphosis, improved step clearance/ length, and increased cadence. Pt satisfied with progress in therapy and reports his confidence walking without AD is returning. Pt was left sitting in recliner w/all needs in reach.   Therapy Documentation Precautions:  Precautions Precautions: Fall Precaution Comments: JP drain R abdomen Restrictions Weight Bearing Restrictions: No   Therapy/Group: Individual Therapy JCruzita LedererPlaster, PT, DPT  05/30/2021, 7:34 AM

## 2021-05-30 NOTE — Progress Notes (Signed)
Occupational Therapy Session Note  Patient Details  Name: Gary Rice MRN: [id number] Date of Birth: [birth year]  Today's Date: 05/30/2021 OT Individual Time: 1401-1441 OT Individual Time Calculation (min): 40 min    Short Term Goals: Week 1:  OT Short Term Goal 1 (Week 1): STGs=LTGs due to ELOS  Skilled Therapeutic Interventions/Progress Updates:    Pt received seated in recliner, no c/o pain, agreeable to therapy. Session focus on activity tolerance, IADL retraining, dynamic standing balance, energy conservation edu in prep for improved ADL/IADL/func mobility performance + decreased caregiver burden. STS and amb to from hospital atrium with [facility] and close S to CGA with fatigue. Pt given verbal list of 5 "shopping list" items to locate in gift shop, pt able to find 5/5 items with overall min cues. Pt req one seated rest break after ambulating to [facility] hospital lobby. Reviewed importance of seated rest breaks when needed, pt reports most conern re DC is his endurance. Amb back to room with CGA + SPC + mod Vcs for pathfinding. Amb transfer to bed, pt ind in adjust bed height to personal preference. Returned to supine mod I.  Pt left semi-reclined in bed with bed alarm engaged, call bell in reach, and all immediate needs met.    Therapy Documentation Precautions:  Precautions Precautions: Fall Precaution Comments: JP drain R abdomen Restrictions Weight Bearing Restrictions: No  Pain: no c/o   ADL: See Care Tool for more details.   Therapy/Group: Individual Therapy  Volanda Napoleon MS, OTR/L  05/30/2021, 6:49 AM

## 2021-05-30 NOTE — Progress Notes (Signed)
Inpatient Rehabilitation Care Coordinator Discharge Note   Patient Details  Name: Sewell Llerenas MRN: [id number] Date of Birth: Oct 25, 1930   Discharge location: HOME WITH WIFE WHO HE ASSISTS  Length of Stay:  9 DAYS  Discharge activity level: MOD/I LEVEL  Home/community participation: YES  Patient response [id number] Literacy - How often do you need to have someone help you when you read instructions, pamphlets, or other written material from your doctor or pharmacy?: Never  Patient response [id number] Isolation - How often do you feel lonely or isolated from those around you?: Never  Services provided included: MD, RD, PT, OT, RN, CM, Pharmacy, SW  Financial Services:  Charity fundraiser Utilized: [place] offered to/list presented to: PT  Follow-up services arranged:  Home Health, DME, Patient/Family request agency HH/DME [place]: [place]    DME : [facility] HH/DME Requested Agency: [facility]  Patient response to transportation need: Is the patient able to respond to transportation needs?: Yes In the past 12 months, has lack of transportation kept you from medical appointments or from getting medications?: No In the past 12 months, has lack of transportation kept you from meetings, work, or from getting things needed for daily living?: No    Comments (or additional information): PT DID WELL AND FEELS READY TO [place]. HIS WIFE CANT WAIT TO SEE HIM COULD NOT VISIT WHILE HERE DUE TO HER HEALTH ISSUES.  Patient/Family verbalized understanding of follow-up arrangements:  Yes  Individual responsible for coordination of the follow-up plan: SELF [phone]  Confirmed correct DME delivered: Elease Hashimoto 05/30/2021    Alnita Aybar, [place]

## 2021-05-30 NOTE — Progress Notes (Signed)
Occupational Therapy Session Note  Patient Details  Name: Gary Rice MRN: [id number] Date of Birth: 1931-05-04  Today's Date: 05/30/2021 OT Individual Time: [id number] OT Individual Time Calculation (min): 54 min    Short Term Goals: Week 1:  OT Short Term Goal 1 (Week 1): STGs=LTGs due to ELOS  Skilled Therapeutic Interventions/Progress Updates:    Treatment session with focus on functional mobility, dynamic standing balance, and endurance.  Pt received upright in recliner reporting already dressed and having brushed teeth.  Discussed typical home routine and pt reporting plan to wait until PICC line d/c to shower.  Pt ambulated with [facility] with supervision to ortho gym.  Engaged in obstacle course incorporating stepping over 2" threshold and around obstacles to simulate maneuvering in home and over shower threshold.  Engaged in alternating toe taps to 6" step with focus on increased endurance.  Pt demonstrating decreased weight shift as he fatigued.  Completed toe tapping to pattern to challenge working memory and balance and endurance.  Pt ambulated 250' with [facility] with supervision, took standing rest break and then 275' back to room.  Discussed standing vs sitting rest breaks as needed and community reintegration.  Pt remained seated upright in recliner with chair alarm on and end of session.  Therapy Documentation Precautions:  Precautions Precautions: Fall Precaution Comments: JP drain R abdomen Restrictions Weight Bearing Restrictions: No  Pain:  Pt with no c/o pain   Therapy/Group: Individual Therapy  Simonne Come 05/30/2021, 12:24 PM

## 2021-05-30 NOTE — Progress Notes (Signed)
PROGRESS NOTE   Subjective/Complaints: Patient seen sitting up in bed this morning.  He states he slept well overnight.  He notes he is constipated and requests bowel meds.  ROS: + Constipation. Denies CP, SOB, N/V/D  Objective:   No results found. No results for input(s): WBC, HGB, HCT, PLT in the last 72 hours.  Recent Labs    05/29/21 0446  NA 134*  K 3.9  CL 99  CO2 28  GLUCOSE 109*  BUN 19  CREATININE 0.80  CALCIUM 8.8*     Intake/Output Summary (Last 24 hours) at 05/30/2021 1024 Last data filed at 05/30/2021 0825 Gross per 24 hour  Intake 955 ml  Output 1175 ml  Net -220 ml         Physical Exam: Vital Signs Blood pressure 98/65, pulse 80, temperature 97.7 F (36.5 C), temperature source Oral, resp. rate 18, height '5\' 11"'$  (1.803 m), weight 75.9 kg, SpO2 96 %. Constitutional: No distress . Vital signs reviewed. HENT: Normocephalic.  Atraumatic. Eyes: EOMI. No discharge. Cardiovascular: No JVD.  RRR. Respiratory: Normal effort.  No stridor.  Bilateral clear to auscultation. GI: Non-distended.  BS +. Skin: Warm and dry.  Incision CDI Psych: Normal mood.  Normal behavior. Musc: No edema in extremities.  No tenderness in extremities. Neuro: Alert and oriented Motor: Grossly 4+/5 throughout, unchanged  Assessment/Plan: 1. Functional deficits which require 3+ hours per day of interdisciplinary therapy in a comprehensive inpatient rehab setting. Physiatrist is providing close team supervision and 24 hour management of active medical problems listed below. Physiatrist and rehab team continue to assess barriers to discharge/monitor patient progress toward functional and medical goals  Care Tool:  Bathing    Body parts bathed by patient: Right arm, Left arm, Chest, Abdomen, Buttocks, Right upper leg, Left upper leg, Right lower leg, Left lower leg, Face     Body parts n/a: Front perineal area   Bathing  assist Assist Level: Contact Guard/Touching assist     Upper Body Dressing/Undressing Upper body dressing   What is the patient wearing?: Pull over shirt    Upper body assist Assist Level: Set up assist    Lower Body Dressing/Undressing Lower body dressing      What is the patient wearing?: Pants     Lower body assist Assist for lower body dressing: Set up assist     Toileting Toileting    Toileting assist Assist for toileting: Independent Assistive Device Comment: urinal   Transfers Chair/bed transfer  Transfers assist     Chair/bed transfer assist level: Supervision/Verbal cueing     Locomotion Ambulation   Ambulation assist      Assist level: Supervision/Verbal cueing Assistive device: Cane-straight Max distance: >400'   Walk 10 feet activity   Assist     Assist level: Supervision/Verbal cueing Assistive device: Cane-straight   Walk 50 feet activity   Assist    Assist level: Supervision/Verbal cueing Assistive device: Cane-straight    Walk 150 feet activity   Assist    Assist level: Supervision/Verbal cueing Assistive device: Cane-straight    Walk 10 feet on uneven surface  activity   Assist Walk 10 feet on uneven surfaces activity  did not occur: Safety/medical concerns (Fatigue)   Assist level: Supervision/Verbal cueing Assistive device: Cane-straight   Wheelchair     Assist Is the patient using a wheelchair?: No             Wheelchair 50 feet with 2 turns activity    Assist            Wheelchair 150 feet activity     Assist          Blood pressure 98/65, pulse 80, temperature 97.7 F (36.5 C), temperature source Oral, resp. rate 18, height '5\' 11"'$  (1.803 m), weight 75.9 kg, SpO2 96 %.  Medical Problem List and Plan: 1.  Debility secondary to pelvic abscess  Continue CIR 2.  Antithrombotics: -DVT/anticoagulation:  Pharmaceutical: Other (comment) Eliquis started   -antiplatelet therapy:  N/A 3. Post-operative pain:   tylenol  325 mg tid for pain.    4. Mood: LCSW to follow for evaluation and support.              -antipsychotic agents: N/A 5. Neuropsych: This patient is capable of making decisions on his own behalf. 6. Skin/Wound Care: Dressing changes to midline wound.              --Continue ensure TID to promote wound healing 7. Fluids/Electrolytes/Nutrition: good appetite 8. Cholecystitis:   LFTs nearly normal 9/1 --Decreased tylenol to 325 mg TID.  --Continue meropenem X 2 weeks w/end date 09/05 9. Pelvic abscess a/p drain placement: Cultures +Kleb oxytoca--> meropenem(S) through 9/5 10.  Mild hyponatremia:   Sodium 134 on 9/1 11. Leucocytosis:  WBC 13.4 on 8/30  Continue to monitor for fevers and other signs of infection.  12. Low calorie malnutrition: Alb-2.2. added juven additionally to help promote wound healing.   -eating well  13.  Pre-diabetes: Hgb A1C-5.8. Fasting BS reasonable. Changed Ensure to Ensure Max but pt doesn't like them.  Monitor with increased exertion 14. H/o  SVT/New onset AFib: Monitor HR TID--continue Diltiazem 120 mg TID with Eliquis bid             --off statin due to Abnormal LFTs.   Controlled on 9/3 15. Acute blood loss anemia: added MVI with iron supplement   Hemoglobin 11.5 on 8/30, labs ordered for Monday  Continue to monitor   LOS: 4 days A FACE TO FACE EVALUATION WAS PERFORMED  Hope Holst Lorie Phenix 05/30/2021, 10:24 AM

## 2021-05-31 NOTE — Progress Notes (Signed)
Pt declined wound care stating they only want it changed BID.

## 2021-05-31 NOTE — Progress Notes (Addendum)
Physical Therapy Session Note  Patient Details  Name: Gary Rice MRN: [id number] Date of Birth: Jul 01, 1931  Today's Date: 05/31/2021 PT Individual Time: 1102-1208; [id number] PT Individual Time Calculation (min): 66 min , 40 min  Short Term Goals: Week 1:  PT Short Term Goal 1 (Week 1): STG = LTG due to LOS  Skilled Therapeutic Interventions/Progress Updates:  Tx 1:  Pt resting in recliner.  He denied pain.  Sit><stand throughout session with supervision.   With pt standing, given mild external perturbations at hips from PT from all directions, pt demonstrated minimal balance strategies. 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  He did not find heel build up in R shoe helpful at that time. He reported needing to use SPC when walking outdoors PTA, particularly on slopes or uneven ground.  neuromuscular re-education for dynamic standing balance, via demo and multimodal cues for 15 x 1 each in standing - bil toe/heel raises (minimal excursion due to lack of toe extension)./ R/L hip abduction , R/L hamstring curls, mini squats. Pt had minimal activation of gluteal muscles L/R.   Gait training x 150' on level tile with SPC R hand, close supervision including turns R/L. Up/down 11 steps with bil rails, self selected step through method, close supervision. Gait training while transporting: x 100' with SPC, tote bag with 3# weight in it close supervision.    At end of session, pt seated in recliner with needs at hand, seat pad alarm set.   Tx 2:  Pt resting in bed.  He denied pain.    Therapeutic exercises performed with LE s and trunk to increase strength for functional mobility.  In supine- 15 x 1 each: R/L straight leg raises, bil bridging, bil bridging with adductor squeezes, cervical flexion.  In L/R side lying- 12 x 1 R/L hip abduction with flexed hips and knees, R/L hip extension with flexed knee (LLE against manual resistance from PT.)  Pt  has limited AROM R hip and poor gluteal recruitment R buttock.   Pt scooted laterally L><R and rolled to L/R side independently.   At end of session, pt resting in bed with alarm set and needs at hand.      Therapy Documentation Precautions:  Precautions Precautions: Fall Precaution Comments: JP drain R abdomen Restrictions Weight Bearing Restrictions: No      Therapy/Group: Individual Therapy  Gary Rice 05/31/2021, 12:14 PM

## 2021-05-31 NOTE — Progress Notes (Signed)
Patient ID: Gary Rice, male   DOB: [birth year], 87 y.o.   MRN: [id number]    Referring Physician(s): Patel,A/Cornett,T  Supervising Physician: Aletta Edouard  Patient Status:  [facility] - In-pt  Chief Complaint: Pelvic abscess   Subjective: Pt currently in therapy room; no new c/o   Allergies: Patient has no known allergies.  Medications: Prior to Admission medications   Medication Sig Start Date End Date Taking? Authorizing Provider  apixaban (ELIQUIS) 5 MG TABS tablet Take 2 tablets (41m) twice daily for 7 days, then 1 tablet (590m twice daily 05/26/21   FeCharlynne CousinsMD  aspirin EC 81 MG tablet Take 81 mg by mouth at bedtime.    [provider]  atorvastatin (LIPITOR) 10 MG tablet Take 10 mg by mouth at bedtime. 08/14/15   [provider]  diltiazem (CARDIZEM) 30 MG tablet TAKE 1 TABLET AS NEEDED FOR RAPID HEART BEAT Patient taking differently: Take 30 mg by mouth daily as needed (rapid heart beat). 10/06/17   InIsaiah SergeNP  fluticasone (FLONASE) 50 MCG/ACT nasal spray Place 1 spray into both nostrils daily. 08/14/15   [provider]  meropenem (MERREM) IVPB Inject 1 g into the vein every 8 (eight) hours for 7 days. Indication:  Cholecystitis/pelvic abscess  First Dose: Yes Last Day of Therapy:  06/02/21 Labs - Once weekly:  CBC/D and BMP, Labs - Every other week:  ESR and CRP Method of administration: Mini-Bag Plus / Gravity Method of administration may be changed at the discretion of home infusion pharmacist based upon assessment of the patient and/or caregiver's ability to self-administer the medication ordered. 05/26/21 06/02/21  FeCharlynne CousinsMD  Multiple Vitamin (MULTIVITAMIN WITH MINERALS) TABS tablet Take 1 tablet by mouth daily.    [provider]  Multiple Vitamins-Minerals (PRESERVISION AREDS 2) CAPS Take 1 capsule 2 (two) times daily by mouth.    [provider]  VITAMIN E PO Take 1 capsule by mouth  daily.     [provider]     Vital Signs: BP (!) 103/59   Pulse 89   Temp 97.7 F (36.5 C)   Resp 18   Ht 5' 11"  (1.803 m)   Wt 167 lb 5.3 oz (75.9 kg)   SpO2 95%   BMI 23.34 kg/m   Physical Exam awake/alert; rt TG drain intact, insertion site ok, NT; OP 25 cc turbid, reddish yellow fluid; drain to JP bulb  Imaging: No results found.  Labs:  CBC: Recent Labs    05/23/21 0302 05/24/21 0945 05/26/21 0500 05/27/21 0500  WBC 14.7* 14.2* 14.3* 13.4*  HGB 11.4* 11.0* 12.2* 11.5*  HCT 33.5* 32.7* 36.2* 34.9*  PLT 421* 403* 404* 401*    COAGS: No results for input(s): INR, APTT in the last 8760 hours.  BMP: Recent Labs    05/25/21 1000 05/26/21 0500 05/27/21 0500 05/29/21 0446  NA 135 134* 134* 134*  K 4.1 4.3 3.9 3.9  CL 101 99 100 99  CO2 27 28 27 28   GLUCOSE 124* 113* 109* 109*  BUN 18 17 20 19   CALCIUM 8.9 9.4 8.9 8.8*  CREATININE 0.81 0.85 0.75 0.80  GFRNONAA >60 >60 >60 >60    LIVER FUNCTION TESTS: Recent Labs    05/25/21 1000 05/26/21 0500 05/27/21 0500 05/29/21 0446  BILITOT 0.9 0.8 1.0 1.1  AST 36 29 24 23   ALT 129* 102* 82* 55*  ALKPHOS 274* 237* 213* 177*  PROT 6.6 6.8 6.4* 6.3*  ALBUMIN 2.0* 2.2* 2.3* 2.3*    Assessment and Plan: Pt s/p drainage of pelvic abscess 8/17; history of open right inguinal hernia repair with mesh as well as mini laparotomy with small bowel resection last month ;BP soft, output 25cc, creatinine normal, previous cultures grew klebsiella and bacteroides; last CT done on 8/22; if output minimal recommend follow-up CT next week   Electronically Signed: D. Rowe Robert, PA-C 05/31/2021, 12:47 PM   I spent a total of 15 Minutes at the the patient's bedside AND on the patient's hospital floor or unit, greater than 50% of which was counseling/coordinating care for pelvic abscess drain

## 2021-05-31 NOTE — Progress Notes (Signed)
PROGRESS NOTE   Subjective/Complaints: No complaints this morning  ROS: + Constipation. Denies CP, SOB, N/V/D  Objective:   No results found. No results for input(s): WBC, HGB, HCT, PLT in the last 72 hours.  Recent Labs    05/29/21 0446  NA 134*  K 3.9  CL 99  CO2 28  GLUCOSE 109*  BUN 19  CREATININE 0.80  CALCIUM 8.8*    Intake/Output Summary (Last 24 hours) at 05/31/2021 1616 Last data filed at 05/31/2021 1300 Gross per 24 hour  Intake 790 ml  Output 1000 ml  Net -210 ml        Physical Exam: Vital Signs Blood pressure 103/66, pulse 92, temperature 99.9 F (37.7 C), temperature source Oral, resp. rate 17, height '5\' 11"'$  (1.803 m), weight 75.9 kg, SpO2 95 %. Gen: no distress, normal appearing HEENT: oral mucosa pink and moist, NCAT Cardio: Reg rate Chest: normal effort, normal rate of breathing Abd: soft, non-distended Ext: no edema Psych: pleasant, normal affect  Skin: Warm and dry.  Incision CDI Psych: Normal mood.  Normal behavior. Musc: No edema in extremities.  No tenderness in extremities. Neuro: Alert and oriented Motor: Grossly 4+/5 throughout, unchanged  Assessment/Plan: 1. Functional deficits which require 3+ hours per day of interdisciplinary therapy in a comprehensive inpatient rehab setting. Physiatrist is providing close team supervision and 24 hour management of active medical problems listed below. Physiatrist and rehab team continue to assess barriers to discharge/monitor patient progress toward functional and medical goals  Care Tool:  Bathing    Body parts bathed by patient: Right arm, Left arm, Chest, Abdomen, Buttocks, Right upper leg, Left upper leg, Right lower leg, Left lower leg, Face     Body parts n/a: Front perineal area   Bathing assist Assist Level: Contact Guard/Touching assist     Upper Body Dressing/Undressing Upper body dressing   What is the patient  wearing?: Pull over shirt    Upper body assist Assist Level: Set up assist    Lower Body Dressing/Undressing Lower body dressing      What is the patient wearing?: Pants     Lower body assist Assist for lower body dressing: Set up assist     Toileting Toileting    Toileting assist Assist for toileting: Independent Assistive Device Comment: urinal   Transfers Chair/bed transfer  Transfers assist     Chair/bed transfer assist level: Supervision/Verbal cueing     Locomotion Ambulation   Ambulation assist      Assist level: Supervision/Verbal cueing Assistive device: Cane-straight Max distance: 150   Walk 10 feet activity   Assist     Assist level: Supervision/Verbal cueing Assistive device: Cane-straight   Walk 50 feet activity   Assist    Assist level: Supervision/Verbal cueing Assistive device: Cane-straight    Walk 150 feet activity   Assist    Assist level: Supervision/Verbal cueing Assistive device: Cane-straight    Walk 10 feet on uneven surface  activity   Assist Walk 10 feet on uneven surfaces activity did not occur: Safety/medical concerns (Fatigue)   Assist level: Supervision/Verbal cueing Assistive device: Cane-straight   Wheelchair     Assist Is the patient  using a wheelchair?: No             Wheelchair 50 feet with 2 turns activity    Assist            Wheelchair 150 feet activity     Assist          Blood pressure 103/66, pulse 92, temperature 99.9 F (37.7 C), temperature source Oral, resp. rate 17, height '5\' 11"'$  (1.803 m), weight 75.9 kg, SpO2 95 %.  Medical Problem List and Plan: 1.  Debility secondary to pelvic abscess  Continue CIR 2.  Antithrombotics: -DVT/anticoagulation:  Pharmaceutical: Other (comment) Eliquis started   -antiplatelet therapy: N/A 3. Post-operative pain:   Continue Tylenol  325 mg tid for pain.    4. Mood: LCSW to follow for evaluation and support.               -antipsychotic agents: N/A 5. Neuropsych: This patient is capable of making decisions on his own behalf. 6. Skin/Wound Care: Dressing changes to midline wound.              --Continue ensure TID to promote wound healing 7. Fluids/Electrolytes/Nutrition: good appetite 8. Cholecystitis:   LFTs nearly normal 9/1 --Decreased tylenol to 325 mg TID.  --Continue meropenem X 2 weeks w/end date 09/05 9. Pelvic abscess a/p drain placement: Cultures +Kleb oxytoca--> meropenem(S) through 9/5 10.  Mild hyponatremia:   Sodium 134 on 9/1 11. Leucocytosis:  WBC 13.4 on 8/30  Continue to monitor for fevers and other signs of infection. High of 99 last night 12. Low calorie malnutrition: Alb-2.2. added juven additionally to help promote wound healing.   -eating well  13.  Pre-diabetes: Hgb A1C-5.8. Fasting BS reasonable. Changed Ensure to Ensure Max but pt doesn't like them.  Monitor with increased exertion 14. H/o  SVT/New onset AFib: Monitor HR TID--continue Diltiazem 120 mg TID with Eliquis bid             --off statin due to Abnormal LFTs.   Controlled on 9/3 15. Acute blood loss anemia: added MVI with iron supplement   Hemoglobin 11.5 on 8/30, labs ordered for Monday  Continue to monitor   LOS: 5 days A FACE TO FACE EVALUATION WAS PERFORMED  Clide Deutscher Celvin Taney 05/31/2021, 4:16 PM

## 2021-05-31 NOTE — Progress Notes (Signed)
Occupational Therapy Session Note  Patient Details  Name: Gary Rice MRN: [id number] Date of Birth: 1931-02-14  Today's Date: 05/31/2021 OT Individual Time: 1000-1100 and 1345-1435 OT Individual Time Calculation (min): 60 min and 50 min   Short Term Goals: Week 1:  OT Short Term Goal 1 (Week 1): STGs=LTGs due to ELOS  Skilled Therapeutic Interventions/Progress Updates:    Session 1: OT session focused on functional mobility, endurance, and discharge planning. Pt received supine in bed agreeable to therapy. Ambulated throughout rehab unit with SBA and [place] with no LOB. Pt completed simulated grocery shopping task without cane and SBA for balance. Discussed EC techniques and safety with transferring grocery bags inside home. At end of session, pt returned to room and left with all needs in reach.   Session 2: OT session focused on UB strength, functional endurance, and functional mobility. Pt ambulated to therapy gym with SBA with [place] then completed scifit level 1 x5 min in forward and backward directions with 5 min rest between. Pt ambulated long distances on/off unit with slight incline showing good awareness of places to take rest breaks. At end of session, pt returned to room and left supine with all needs in reach.   Therapy Documentation Precautions:  Precautions Precautions: Fall Precaution Comments: JP drain R abdomen Restrictions Weight Bearing Restrictions: No General:   Vital Signs:  Pain: Pain Assessment Pain Scale: 0-10 Pain Score: 0-No pain ADL: ADL Grooming: Setup Where Assessed-Grooming: Standing at sink Upper Body Bathing: Supervision/safety Where Assessed-Upper Body Bathing: Standing at sink Lower Body Bathing: Supervision/safety Where Assessed-Lower Body Bathing: Sitting at sink Upper Body Dressing: Supervision/safety Where Assessed-Upper Body Dressing: Sitting at sink Lower Body Dressing: Supervision/safety Where Assessed-Lower Body Dressing: Sitting at  sink Vision   Perception    Praxis   Exercises:   Other Treatments:     Therapy/Group: Individual Therapy  Duayne Cal 05/31/2021, 12:13 PM

## 2021-06-02 ENCOUNTER — Inpatient Hospital Stay (HOSPITAL_COMMUNITY): Payer: Medicare Other

## 2021-06-02 LAB — CBC
HCT: 36.6 % — ABNORMAL LOW (ref 39.0–52.0)
Hemoglobin: 11.8 g/dL — ABNORMAL LOW (ref 13.0–17.0)
MCH: 30.6 pg (ref 26.0–34.0)
MCHC: 32.2 g/dL (ref 30.0–36.0)
MCV: 94.8 fL (ref 80.0–100.0)
Platelets: 351 10*3/uL (ref 150–400)
RBC: 3.86 MIL/uL — ABNORMAL LOW (ref 4.22–5.81)
RDW: 14 % (ref 11.5–15.5)
WBC: 14.5 10*3/uL — ABNORMAL HIGH (ref 4.0–10.5)
nRBC: 0 % (ref 0.0–0.2)

## 2021-06-02 LAB — COMPREHENSIVE METABOLIC PANEL
ALT: 30 U/L (ref 0–44)
AST: 17 U/L (ref 15–41)
Albumin: 2.5 g/dL — ABNORMAL LOW (ref 3.5–5.0)
Alkaline Phosphatase: 139 U/L — ABNORMAL HIGH (ref 38–126)
Anion gap: 11 (ref 5–15)
BUN: 21 mg/dL (ref 8–23)
CO2: 27 mmol/L (ref 22–32)
Calcium: 9.5 mg/dL (ref 8.9–10.3)
Chloride: 97 mmol/L — ABNORMAL LOW (ref 98–111)
Creatinine, Ser: 0.74 mg/dL (ref 0.61–1.24)
GFR, Estimated: >60 mL/min (ref >60)
Glucose, Bld: 102 mg/dL — ABNORMAL HIGH (ref 70–99)
Potassium: 4.1 mmol/L (ref 3.5–5.1)
Sodium: 135 mmol/L (ref 135–145)
Total Bilirubin: 0.7 mg/dL (ref 0.3–1.2)
Total Protein: 6.7 g/dL (ref 6.5–8.1)

## 2021-06-02 MED ORDER — IOHEXOL 350 MG/ML SOLN
75.0000 mL | Freq: Once | INTRAVENOUS | Status: AC | PRN
  Administered 2021-06-02: 75 mL via INTRAVENOUS

## 2021-06-02 MED ORDER — SENNA 8.6 MG PO TABS
1.0000 | ORAL_TABLET | Freq: Two times a day (BID) | ORAL | Status: DC
  Administered 2021-06-02 – 2021-06-04 (×5): 8.6 mg via ORAL
  Filled 2021-06-02 (×6): qty 1

## 2021-06-02 MED ORDER — DILTIAZEM HCL 60 MG PO TABS
60.0000 mg | ORAL_TABLET | Freq: Three times a day (TID) | ORAL | Status: DC
  Filled 2021-06-02 (×8): qty 1

## 2021-06-02 MED ORDER — IOHEXOL 9 MG/ML PO SOLN
500.0000 mL | ORAL | Status: AC
Start: 2021-06-02 — End: 2021-06-02
  Administered 2021-06-02 (×2): 500 mL via ORAL

## 2021-06-02 NOTE — Progress Notes (Signed)
Physical Therapy Session Note  Patient Details  Name: Gary Rice MRN: [id number] Date of Birth: 1931/01/27  Today's Date: 06/02/2021 PT Individual Time: 0800 - 0845, 45 min     Short Term Goals: Week 1:  PT Short Term Goal 1 (Week 1): STG = LTG due to LOS  Skilled Therapeutic Interventions/Progress Updates:  Pt resting in recliner, with IV connected.  Nurse disconnected during session . He denied pain.  Tx focused on education for self stretching bil hamstrings and heel cords in sitting, using foot stool. Pt performed 3 x 30 seconds R/L.  Pt has a L knee contracture, chronic since R THA.  Therapeutic exercises performed with LEs and trunk to increase strength for functional mobility. Seated 15 x 1- trunk extension/flexion with arms across chest, bil glut sets, bil scapular retraction.   Gait training with SPC on level tile, modified independent.  Gait on mulch and up/down ramp with supervision.  Transfer to SUV seat height in simulated car,modified independent including SPC. Marland Kitchen  At end of session, pt seated in recliner with seat pad set and needs at hand.      Therapy Documentation Precautions:  Precautions Precautions: Fall Precaution Comments: JP drain R abdomen Restrictions Weight Bearing Restrictions: No      Therapy/Group: Individual Therapy  Tenicia Gural 06/02/2021, 8:10 AM

## 2021-06-02 NOTE — Progress Notes (Signed)
PROGRESS NOTE   Subjective/Complaints: Pt reports he's "here"- refused getting dressing changes yesterday x1.  Ate >75% Not getting a lot of stool out- but doesn't want to blow out- will add senokot.   Pt reports his BP is "too low" even though asymptomatic- has new onset SVT/Afib per chart- so cannot be off meds- only on Diltiazem- so will reduce Diltiazem dose.     ROS:  Pt denies SOB, abd pain, CP, N/V/C/D, and vision changes   Objective:   No results found. Recent Labs    06/02/21 0412  WBC 14.5*  HGB 11.8*  HCT 36.6*  PLT 351    Recent Labs    06/02/21 0412  NA 135  K 4.1  CL 97*  CO2 27  GLUCOSE 102*  BUN 21  CREATININE 0.74  CALCIUM 9.5    Intake/Output Summary (Last 24 hours) at 06/02/2021 0916 Last data filed at 06/02/2021 0730 Gross per 24 hour  Intake 726 ml  Output 1955 ml  Net -1229 ml        Physical Exam: Vital Signs Blood pressure 98/86, pulse 96, temperature 98.1 F (36.7 C), resp. rate 16, height '5\' 11"'$  (1.803 m), weight 75.9 kg, SpO2 95 %.    General: awake, alert, appropriate, sitting up in bedside chair,  NAD HENT: conjugate gaze; oropharynx moist CV: irregular but rate controlled; no JVD Pulmonary: CTA B/L; no W/R/R- good air movement GI: soft, NT, ND, (+)BS Psychiatric: appropriate; very talkative Neurological: alert Skin: Warm and dry.  Incision CDI- no drainage seen Musc: No edema in extremities.  No tenderness in extremities. Neuro: Alert and oriented Motor: Grossly 4+/5 throughout, unchanged  Assessment/Plan: 1. Functional deficits which require 3+ hours per day of interdisciplinary therapy in a comprehensive inpatient rehab setting. Physiatrist is providing close team supervision and 24 hour management of active medical problems listed below. Physiatrist and rehab team continue to assess barriers to discharge/monitor patient progress toward functional and medical  goals  Care Tool:  Bathing    Body parts bathed by patient: Right arm, Left arm, Chest, Abdomen, Buttocks, Right upper leg, Left upper leg, Right lower leg, Left lower leg, Face     Body parts n/a: Front perineal area   Bathing assist Assist Level: Contact Guard/Touching assist     Upper Body Dressing/Undressing Upper body dressing   What is the patient wearing?: Pull over shirt    Upper body assist Assist Level: Set up assist    Lower Body Dressing/Undressing Lower body dressing      What is the patient wearing?: Pants     Lower body assist Assist for lower body dressing: Set up assist     Toileting Toileting    Toileting assist Assist for toileting: Independent Assistive Device Comment: urinal   Transfers Chair/bed transfer  Transfers assist     Chair/bed transfer assist level: Independent with assistive device     Locomotion Ambulation   Ambulation assist      Assist level: Independent with assistive device Assistive device: Walker-rolling Max distance: 100   Walk 10 feet activity   Assist     Assist level: Supervision/Verbal cueing Assistive device: Cane-straight   Walk  50 feet activity   Assist    Assist level: Independent with assistive device Assistive device: Cane-straight    Walk 150 feet activity   Assist    Assist level: Supervision/Verbal cueing Assistive device: Cane-straight    Walk 10 feet on uneven surface  activity   Assist Walk 10 feet on uneven surfaces activity did not occur: Safety/medical concerns (Fatigue)   Assist level: Supervision/Verbal cueing Assistive device: Cane-straight   Wheelchair     Assist Is the patient using a wheelchair?: No             Wheelchair 50 feet with 2 turns activity    Assist            Wheelchair 150 feet activity     Assist          Blood pressure 98/86, pulse 96, temperature 98.1 F (36.7 C), resp. rate 16, height '5\' 11"'$  (1.803 m),  weight 75.9 kg, SpO2 95 %.  Medical Problem List and Plan: 1.  Debility secondary to pelvic abscess  Con't CIR- PT and OT 2.  Antithrombotics: -DVT/anticoagulation:  Pharmaceutical: Other (comment) Eliquis started   -antiplatelet therapy: N/A 3. Post-operative pain:   Continue Tylenol  325 mg tid for pain.  9/5- pain controlled- con't regimen - said doesn't have pain.   4. Mood: LCSW to follow for evaluation and support.              -antipsychotic agents: N/A 5. Neuropsych: This patient is capable of making decisions on his own behalf. 6. Skin/Wound Care: Dressing changes to midline wound.              --Continue ensure TID to promote wound healing 7. Fluids/Electrolytes/Nutrition: good appetite 8. Cholecystitis:   LFTs nearly normal 9/1 --Decreased tylenol to 325 mg TID.  --Continue meropenem X 2 weeks w/end date 09/05 9/5- called ID- asked me to order CT of abd/pelvis contrast and will decide plan on IV ABX from that.  9. Pelvic abscess a/p drain placement: Cultures +Kleb oxytoca--> meropenem(S) through 9/5 10.  Mild hyponatremia:   Sodium 134 on 9/1 11. Leucocytosis:  WBC 13.4 on 8/30  Continue to monitor for fevers and other signs of infection. Temp-High of 99 last night  9/5- WBC 14.5- slightly up- called ID as above - pending plan 12. Low calorie malnutrition: Alb-2.2. added juven additionally to help promote wound healing.   -eating well  13.  Pre-diabetes: Hgb A1C-5.8. Fasting BS reasonable. Changed Ensure to Ensure Max but pt doesn't like them.  Monitor with increased exertion 14. H/o  SVT/New onset AFib: Monitor HR TID--continue Diltiazem 120 mg TID with Eliquis bid             --off statin due to Abnormal LFTs.  9/5- will reduce Diltiazem to 60 mg   Controlled on 9/3 15. Acute blood loss anemia: added MVI with iron supplement   Hemoglobin 11.5 on 8/30, labs ordered for Monday  9/5- Hb 11.8- con't regimen  Continue to monitor    I spent a total of 36 minutes on  total care- >50% coordination of care- d/w ID and IR about CT scan.    LOS: 7 days A FACE TO FACE EVALUATION WAS PERFORMED  Karey Stucki 06/02/2021, 9:16 AM

## 2021-06-02 NOTE — Progress Notes (Signed)
Occupational Therapy Session Note  Patient Details  Name: Binyomin Brann MRN: [id number] Date of Birth: 02-28-1931  Today's Date: 06/02/2021 OT Individual Time: 4471-5806 OT Individual Time Calculation (min): 57 min   Skilled Therapeutic Interventions/Progress Updates:    Pt greeted seated on toilet in bathroom. Pt with successful BM and voided bladder, toileting completed mod I. Pt then ambulated out of bathroom with SPC and completed grooming tasks at the sink without OT assist. Pt ambulated to therapy gym with 2 standing rest breaks and no LOB. UB there-ex using level 2 theraband 3 sets of 10, chest pull, seated row, bicep curl, and triceps press. Pt then forked on functional ambulation in hallway with 3 standing rest breaks.Pt returned to room and left semi-reclined in bed with needs met.   Therapy Documentation Precautions:  Precautions Precautions: Fall Precaution Comments: JP drain R abdomen Restrictions Weight Bearing Restrictions: No  Pain:  Denies pain   Therapy/Group: Individual Therapy  Valma Cava 06/02/2021, 10:33 AM

## 2021-06-02 NOTE — Progress Notes (Signed)
Physical Therapy Discharge Summary  Patient Details  Name: Gary Rice MRN: [id number] Date of Birth: May 30, 1931  Patient has met 7 of 7 long term goals due to improved activity tolerance, improved balance, improved postural control, and increased strength.  Patient to discharge at an ambulatory level Modified Independent.   Patient's care partner unavailable to provide the necessary physical assistance at discharge.   Recommendation:  Patient will benefit from ongoing skilled PT services in home health setting to continue to advance safe functional mobility, address ongoing impairments in global deconditioning, balance, and minimize fall risk.  Equipment: No equipment provided  Reasons for discharge: treatment goals met and discharge from hospital  Patient/family agrees with progress made and goals achieved: Yes  PT Discharge Precautions/Restrictions Precautions Precautions: Fall Precaution Comments: JP drain R abdomen Restrictions Weight Bearing Restrictions: No Pain Interference Pain Interference Pain Effect on Sleep: 1. Rarely or not at all Pain Interference with Therapy Activities: 1. Rarely or not at all Pain Interference with Day-to-Day Activities: 1. Rarely or not at all Vision/Perception  Perception Perception: Within Functional Limits Praxis Praxis: Intact  Cognition Overall Cognitive Status: Within Functional Limits for tasks assessed Arousal/Alertness: Awake/alert Orientation Level: Oriented X4 Awareness: Appears intact Problem Solving: Appears intact Safety/Judgment: Appears intact Sensation Sensation Light Touch: Appears Intact Coordination Gross Motor Movements are Fluid and Coordinated: No Fine Motor Movements are Fluid and Coordinated: Yes Coordination and Movement Description: impaired secondary to general deconditioning Finger Nose Finger Test: [facility] Heel Shin Test: [facility] Motor  Motor Motor: Within Functional Limits Motor - Discharge  Observations: [facility]  Mobility Bed Mobility Bed Mobility: Rolling Right;Rolling Left;Supine to Sit;Scooting to [facility];Sit to Supine;Sitting - Scoot to Edge of Bed Rolling Right: Independent Rolling Left: Independent Supine to Sit: Independent Sitting - Scoot to [employer] of Bed: Independent Sit to Supine: Independent Scooting to HOB: Independent Transfers Transfers: Sit to Stand;Stand to [employer] Transfers Sit to Stand: Independent with assistive device (SPC) Stand to Sit: Independent with assistive device (SPC) Stand Pivot Transfers: Independent with assistive device (SPC) Transfer (Assistive device): Straight cane Locomotion  Gait Ambulation: Yes Gait Assistance: Independent with assistive device Gait Distance (Feet): 200 Feet Assistive device: Straight cane Gait Gait: Yes Gait Pattern: Impaired Gait Pattern: Decreased step length - left;Decreased step length - right;Decreased stride length;Trunk flexed Gait velocity: decreased Stairs / Additional Locomotion Stairs: Yes Stairs Assistance: Independent with assistive device Stair Management Technique: Two rails Number of Stairs: 12 Height of Stairs: 6 Ramp: Independent with assistive device Curb: Independent with assistive device Wheelchair Mobility Wheelchair Mobility: No  Trunk/Postural Assessment  Cervical Assessment Cervical Assessment: Within Functional Limits Thoracic Assessment Thoracic Assessment: Exceptions to [facility] (Forward shoulders) Lumbar Assessment Lumbar Assessment: Exceptions to [facility] (posterior pelvic tilt) Postural Control Postural Control: Within Functional Limits  Balance Balance Balance Assessed: Yes Standardized Balance Assessment Standardized Balance Assessment: Berg Balance Test Berg Balance Test Sit to Stand: Able to stand without using hands and stabilize independently Standing Unsupported: Able to stand safely 2 minutes Sitting with Back Unsupported but Feet Supported on Floor or Stool: Able to  sit safely and securely 2 minutes Stand to Sit: Sits safely with minimal use of hands Transfers: Able to transfer safely, minor use of hands Standing Unsupported with Eyes Closed: Able to stand 10 seconds safely Standing Ubsupported with Feet Together: Able to place feet together independently and stand 1 minute safely From Standing, Reach Forward with Outstretched Arm: Can reach forward >12 cm safely (5") From Standing Position, Pick up Object from  Floor: Able to pick up shoe safely and easily From Standing Position, Turn to Look Behind Over each Shoulder: Looks behind one side only/other side shows less weight shift Turn 360 Degrees: Able to turn 360 degrees safely but slowly Standing Unsupported, Alternately Place Feet on Step/Stool: Able to stand independently and complete 8 steps >20 seconds Standing Unsupported, One Foot in Front: Able to plae foot ahead of the other independently and hold 30 seconds Standing on One Leg: Tries to lift leg/unable to hold 3 seconds but remains standing independently Total Score: 47 Static Sitting Balance Static Sitting - Balance Support: No upper extremity supported;Feet supported Static Sitting - Level of Assistance: 7: Independent Dynamic Sitting Balance Dynamic Sitting - Balance Support: Feet supported;No upper extremity supported Dynamic Sitting - Level of Assistance: 7: Independent Static Standing Balance Static Standing - Balance Support: During functional activity;Right upper extremity supported Static Standing - Level of Assistance: 6: Modified independent (Device/Increase time) Dynamic Standing Balance Dynamic Standing - Balance Support: During functional activity;Right upper extremity supported Dynamic Standing - Level of Assistance: 6: Modified independent (Device/Increase time) Dynamic Standing - Balance Activities: Forward lean/weight shifting;Lateral lean/weight shifting Extremity Assessment  RLE Assessment RLE Assessment: Within  Functional Limits General Strength Comments: Grossly 4+/5 LLE Assessment LLE Assessment: Within Functional Limits General Strength Comments: Grossly 4+/5   Gary Rice Jadrian Bulman, PT, DPT 06/04/2021, 2:36 PM

## 2021-06-02 NOTE — Progress Notes (Signed)
Occupational Therapy Session Note  Patient Details  Name: Gary Rice MRN: [id number] Date of Birth: [birth year]  Today's Date: 06/02/2021 OT Individual Time: 1135-1200 OT Individual Time Calculation (min): 25 min    Short Term Goals: Week 1:  OT Short Term Goal 1 (Week 1): STGs=LTGs due to ELOS  Skilled Therapeutic Interventions/Progress Updates:    Pt resting in bed stating he was very fatigued from earlier sessions and he would rather wait to walk until this afternoon. Instead, reviewed his home set up. Pt stated he ordered a shower/tub seat and has what he needs for home. Discussed various issues with his medical status that pt wanted to share with me.  It was time for lunch, so pt got up and ambulated independently with his cane to recliner. He showed me how he uses his shoe horn and then how he has learned to use his cane to reach buttons out of his reach!  Pt adapts well!  Pt resting in recliner with all needs met and chair alarm on.   Therapy Documentation Precautions:  Precautions Precautions: Fall Precaution Comments: JP drain R abdomen Restrictions Weight Bearing Restrictions: No   Pain: no c/o pain   ADL: ADL Grooming: Setup Where Assessed-Grooming: Standing at sink Upper Body Bathing: Supervision/safety Where Assessed-Upper Body Bathing: Standing at sink Lower Body Bathing: Supervision/safety Where Assessed-Lower Body Bathing: Sitting at sink Upper Body Dressing: Supervision/safety Where Assessed-Upper Body Dressing: Sitting at sink Lower Body Dressing: Supervision/safety Where Assessed-Lower Body Dressing: Sitting at sink   Therapy/Group: Individual Therapy  [place] 06/02/2021, 1:13 PM

## 2021-06-02 NOTE — Discharge Summary (Signed)
Physician Discharge Summary  Patient ID: Gary Rice [facility] MRN: [id number] DOB/AGE: 01-26-1931 87 y.o.  Admit date: 05/26/2021 Discharge date: 06/05/2021  Discharge Diagnoses:  Principal Problem:   Debility Active Problems:   Acute blood loss anemia   History of supraventricular tachycardia   Hyponatremia   Cholecystitis, acute   Discharged Condition: good  Significant Diagnostic Studies: US Abdomen Limited RUQ (LIVER/GB)  Result Date: 05/20/2021 CLINICAL DATA:  87 year old male with abnormal gallbladder on CT Abdomen and Pelvis. Abdominal pain, postop day 18 inguinal hernia repair with mesh. EXAM: ULTRASOUND ABDOMEN LIMITED RIGHT UPPER QUADRANT COMPARISON:  CT Abdomen and Pelvis 05/19/2021. FINDINGS: Gallbladder: Gallbladder distended with sludge (image 7) and there is abnormal gallbladder wall thickening up to 6 mm (image 10). Dependent shadowing stones, gravel-like on image 16. Positive sonographic Murphy sign. Common bile duct: Diameter: 8 mm, mildly dilated. Liver: No focal lesion identified. Within normal limits in parenchymal echogenicity. Mild intrahepatic biliary ductal dilatation was better demonstrated by CT. Portal vein is patent on color Doppler imaging with normal direction of blood flow towards the liver. Other: Negative visible right kidney. IMPRESSION: 1. Acute Cholecystitis: gallbladder distended with sludge and stones, thickened wall, and positive sonographic Murphy sign. 2. Mild intra- and extrahepatic biliary ductal dilatation suspicious for associated choledocholithiasis. Electronically Signed   By: Genevie Ann M.D.   On: 05/20/2021 11:25    Labs:  Basic Metabolic Panel: BMP Latest Ref Rng & Units 06/02/2021 05/29/2021 05/27/2021  Glucose 70 - 99 mg/dL 102(H) 109(H) 109(H)  BUN 8 - 23 mg/dL _0 Creatinine 0.61 - 1.24 mg/dL 0.74 0.80 0.75  Sodium 135 - 145 mmol/L 135 134(L) 134(L)  Potassium 3.5 - 5.1 mmol/L 4.1 3.9 3.9  Chloride 98 - 111 mmol/L 97(L) 99 100  CO2 22 -  32 mmol/L _1 Calcium 8.9 - 10.3 mg/dL 9.5 8.8(L) 8.9     CBC: CBC Latest Ref Rng & Units 06/02/2021 05/27/2021 05/26/2021  WBC 4.0 - 10.5 K/uL 14.5(H) 13.4(H) 14.3(H)  Hemoglobin 13.0 - 17.0 g/dL 11.8(L) 11.5(L) 12.2(L)  Hematocrit 39.0 - 52.0 % 36.6(L) 34.9(L) 36.2(L)  Platelets 150 - 400 K/uL 351 401(H) 404(H)    LFTs: Hepatic Function Latest Ref Rng & Units 06/02/2021 05/29/2021 05/27/2021  Total Protein 6.5 - 8.1 g/dL 6.7 6.3(L) 6.4(L)  Albumin 3.5 - 5.0 g/dL 2.5(L) 2.3(L) 2.3(L)  AST 15 - 41 U/L _2 ALT 0 - 44 U/L 30 55(H) 82(H)  Alk Phosphatase 38 - 126 U/L 139(H) 177(H) 213(H)  Total Bilirubin 0.3 - 1.2 mg/dL 0.7 1.1 1.0     CBG: No results for input(s): GLUCAP in the last 168 hours.  Brief HPI:   Gary Rice is a 87 y.o. male with history of SVT, bilateral inguinal hernias was admitted on 05/02/2021 with vomiting due to incarcerated hernia with SBO as well as new onset of A. fib a flutter.  He was started on IV diltiazem for rate control with recommendations to add Eliquis once cleared surgically.  He was taken to the OR emergently for repair of incarcerated hernia by Dr. Bobbye Morton.  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 Marlou Porch per recommendations.  Hospital course was significant for increasing abdominal distention once NG tube was removed due to SBO with microperforation and he was taken back to the OR on 08/12 for open small bowel resection by Dr. Donne Hazel.  He did develop leukocytosis due to development of pelvic  abscesses which was treated with pelvic drain by Dr. Dwaine Gale on 08/16.    Cultures grew out Klebsiella oxytocin and Bacteroides fragilis and antibiotics changed to meropenem per ID input.  He did have rising LFTs due to cholecystitis with cholelithiasis and follow-up CT abdomen pelvis showed significant decrease in size of abscesses.  MRCP done per input by Dr. Paulita Fujita and showed acute cholecystitis without choledocholithiasis  or significant CBD dilatation or obstructing liver mass.  Treatment with conservative care and antibiotics recommended by GI and general surgery.  Patient's LFTs were improving and he was tolerating diet without pain or nausea vomiting.  Midline incision is being treated with wet-to-dry dressing and and drain care ongoing.  He was noted to have deficits in mobility as well as ADLs and CIR was recommended due to functional decline.   Hospital Course: Gary Rice [facility] was admitted to rehab 05/26/2021 for inpatient therapies to consist of PT and OT at least three hours five days a week. Past admission physiatrist, therapy team and rehab RN have worked together to provide customized collaborative inpatient rehab.  His blood pressures were monitored on TID basis and continue to run on the low side.  Cardizem was decreased to 60 mg TID to avoid hypotension however patient refused this for at least 4 days prior to discharge and patient did not feel need to continue this medicine at discharge.  He was advised to continue monitoring blood pressures twice daily as well as monitoring heart rate and to utilize home dose of Cardizem as needed heart rate greater than 100 as well as contacting cardiology for follow-up after discharge.  Protein supplements were added to help with low calorie malnutrition.   His p.o. intake has been good and fasting blood sugars were noted to be improving.   Serial check of lytes showed that hyponatremia has resolved and renal status is stable.  Abnormal LFTs have almost normalized.  Follow-up CBC shows acute blood loss anemia to be stable. he was maintained on meropenem with end date on 09/05.  Multivitamin with iron was added for supplementation and serial check of CBC showed persistent elevation in white count despite decrease in drainage from right pelvic drain. ID was consulted for input and recommended repeating CT abdomen pelvis.  This showed significant decreased to near complete  resolution of fluid collection anterior to the rectum with walls of collection decompressed from pigtail catheter.  Layering sludge and stones in gallbladder with pericholecystic fluid without gallbladder distention.    ID felt as no new fluid collections of concern identified; to transition patient to Bactrim and metronidazole twice daily for 14 days to follow-up for further input on outpatient basis.  Interventional radiology was consulted for input and drain was removed on 09/26 without difficulty.  Constipation has resolved with augmentation of bowel program and pain has been controlled with as needed Tylenol use.  Midline abdominal incision is healing in nicely with beefy red wound base, no odor or signs of infection.  TID dressing changes were ongoing and patient was advised to continue with active discharge.  He has made good gains during his rehab stay and is at modified independent level.  He will continue to receive home health PT and OT by [facility] home health after discharge.    Rehab course: During patient's stay in rehab weekly team conferences were held to monitor patient's progress, set goals and discuss barriers to discharge. At admission, patient required supervision with mobility and supervision to min assist with basic ADL  tasks. He  has had improvement in activity tolerance, balance, postural control as well as ability to compensate for deficits.  He is able to complete ADL tasks at modified independent level.  He is modified independent for transfers and is able to ambulate 200 feet with SPC.   Discharge disposition: 01-Home or Self Care  Diet: Carb Modified/Heart Healthy.   Special Instructions: No driving or strenuous activity till cleared by MD 2.  Pack abdominal wound with damp to dry dressing and change 3 times daily.  Allergies as of 06/04/2021   No Known Allergies      Medication List     STOP taking these medications    aspirin EC 81 MG tablet   atorvastatin 10  MG tablet Commonly known as: LIPITOR   diltiazem 30 MG tablet Commonly known as: CARDIZEM   fluticasone 50 MCG/ACT nasal spray Commonly known as: FLONASE   meropenem  IVPB Commonly known as: MERREM       TAKE these medications    apixaban 5 MG Tabs tablet Commonly known as: ELIQUIS Take 1 tablet (5 mg total) by mouth 2 (two) times daily. What changed:  how much to take how to take this when to take this additional instructions   diltiazem 180 MG 24 hr capsule Commonly known as: Cardizem CD Take 1 capsule (180 mg total) by mouth daily.   metroNIDAZOLE 500 MG tablet Commonly known as: FLAGYL Take 1 tablet (500 mg total) by mouth every 12 (twelve) hours for 14 days.   multivitamin with minerals Tabs tablet Take 1 tablet by mouth daily.   pantoprazole 40 MG tablet Commonly known as: PROTONIX Take 1 tablet (40 mg total) by mouth daily.   PreserVision AREDS 2 Caps Take 1 capsule 2 (two) times daily by mouth.   senna 8.6 MG Tabs tablet Commonly known as: SENOKOT Take 1 tablet (8.6 mg total) by mouth 2 (two) times daily.   sulfamethoxazole-trimethoprim 800-160 MG tablet Commonly known as: BACTRIM DS Take 1 tablet by mouth every 12 (twelve) hours for 14 days.   VITAMIN E PO Take 1 capsule by mouth daily.        Follow-up Information     Lovorn, Jinny Blossom, MD Follow up.   Specialty: Physical Medicine and Rehabilitation Why: No formal follow-up needed Contact information: [address] [place] [address] [phone]         Rolm Bookbinder, MD Follow up.   Specialty: General Surgery Why: Call for appointment Contact information: [place] [address] [phone]         Thompson Grayer, MD Follow up.   Specialty: Cardiology Why: Call for appointment Contact information: [address] [place] [address] [phone]         Aletta Edouard, MD Follow up.   Specialties:  Interventional Radiology, Radiology Why: Call for appointment Contact information: [address] [place] [address] [phone]         Mignon Pine, DO Follow up on 06/18/2021.   Specialties: Infectious Diseases, Internal Medicine Why: Keep appt Contact information: [address] [place] [address] [phone]                 Signed: Bary Leriche 06/06/2021, 7:22 PM

## 2021-06-02 NOTE — Progress Notes (Signed)
Occupational Therapy Discharge Summary  Patient Details  Name: Gary Rice MRN: [id number] Date of Birth: 1931-01-12  (Pt planned d/c on 06/03/21 moved to 06/04/21 due to medical issue)   Patient has met 9 of 9 long term goals due to improved activity tolerance, improved balance, postural control, and ability to compensate for deficits.  Patient to discharge at overall Modified Independent level.    Reasons goals not met: n/a  Recommendation:  Patient will benefit from ongoing skilled OT services in home health setting to continue to advance functional skills in the area of  BADLs .  Equipment: Non, already has a shower chair  Reasons for discharge: treatment goals met and discharge from hospital  Patient/family agrees with progress made and goals achieved: Yes  OT Discharge Precautions/Restrictions  Precautions Precautions: Fall Precaution Comments: JP drain R abdomen Restrictions Weight Bearing Restrictions: No Pain  Denies pain ADL ADL Eating: Independent Grooming: Independent Where Assessed-Grooming: Standing at sink Upper Body Bathing: Modified independent Where Assessed-Upper Body Bathing: Standing at sink Lower Body Bathing: Modified independent Where Assessed-Lower Body Bathing: Sitting at sink Upper Body Dressing: Modified independent (Device) Where Assessed-Upper Body Dressing: Sitting at sink Lower Body Dressing: Modified independent Where Assessed-Lower Body Dressing: Sitting at sink Toileting: Modified independent Toilet Transfer: Modified independent Perception  Perception: Within Functional Limits Praxis Praxis: Intact Cognition Overall Cognitive Status: Within Functional Limits for tasks assessed Arousal/Alertness: Awake/alert Orientation Level: Oriented X4 Year: 2022 Month: August Day of Week: Correct Attention: Focused;Sustained Focused Attention: Appears intact Sustained Attention: Appears intact Memory: Appears intact Immediate Memory  Recall: Sock;Blue;Bed Memory Recall Sock: Without Cue Memory Recall Blue: Without Cue Memory Recall Bed: Not able to recall Awareness: Appears intact Problem Solving: Appears intact Sensation Coordination Gross Motor Movements are Fluid and Coordinated: No Fine Motor Movements are Fluid and Coordinated: Yes Motor  Motor Motor: Within Functional Limits Motor - Discharge Observations: [facility] Mobility  Bed Mobility Bed Mobility: Rolling Right;Rolling Left;Sit to Supine;Supine to Sit Rolling Right: Independent Rolling Left: Independent Supine to Sit: Independent Sit to Supine: Independent Transfers Sit to Stand: Independent with assistive device Stand to Sit: Independent with assistive device  Balance Static Sitting Balance Static Sitting - Level of Assistance: 7: Independent Dynamic Sitting Balance Dynamic Sitting - Level of Assistance: 6: Modified independent (Device/Increase time) Static Standing Balance Static Standing - Level of Assistance: 6: Modified independent (Device/Increase time) Dynamic Standing Balance Dynamic Standing - Level of Assistance: 6: Modified independent (Device/Increase time) Extremity/Trunk Assessment RUE Assessment RUE Assessment: Within Functional Limits LUE Assessment LUE Assessment: Within Functional Limits   Daneen Schick Doe 06/02/2021, 1:21 PM

## 2021-06-03 MED ORDER — PANTOPRAZOLE SODIUM 40 MG PO TBEC: 40.0000 mg | DELAYED_RELEASE_TABLET | Freq: Every day | ORAL | 0 refills | Status: DC

## 2021-06-03 MED ORDER — SULFAMETHOXAZOLE-TRIMETHOPRIM 800-160 MG PO TABS: 1.0000 | ORAL_TABLET | Freq: Two times a day (BID) | ORAL | 0 refills | Status: AC

## 2021-06-03 MED ORDER — SULFAMETHOXAZOLE-TRIMETHOPRIM 800-160 MG PO TABS
1.0000 | ORAL_TABLET | Freq: Two times a day (BID) | ORAL | Status: DC
  Administered 2021-06-03 – 2021-06-04 (×3): 1 via ORAL
  Filled 2021-06-03 (×3): qty 1

## 2021-06-03 MED ORDER — METRONIDAZOLE 500 MG PO TABS
500.0000 mg | ORAL_TABLET | Freq: Two times a day (BID) | ORAL | Status: DC
  Administered 2021-06-03 – 2021-06-04 (×3): 500 mg via ORAL
  Filled 2021-06-03 (×4): qty 1

## 2021-06-03 MED ORDER — APIXABAN 5 MG PO TABS: 5.0000 mg | ORAL_TABLET | Freq: Two times a day (BID) | ORAL | 0 refills | Status: DC

## 2021-06-03 MED ORDER — METRONIDAZOLE 500 MG PO TABS: 500.0000 mg | ORAL_TABLET | Freq: Two times a day (BID) | ORAL | 0 refills | Status: AC

## 2021-06-03 MED ORDER — DILTIAZEM HCL 60 MG PO TABS: 60.0000 mg | ORAL_TABLET | Freq: Three times a day (TID) | ORAL | 0 refills | Status: DC

## 2021-06-03 MED ORDER — SENNA 8.6 MG PO TABS: 1.0000 | ORAL_TABLET | Freq: Two times a day (BID) | ORAL | 0 refills | Status: DC

## 2021-06-03 NOTE — Progress Notes (Signed)
[facility] for Infectious Disease  Date of Admission:  05/26/2021   Total days of antibiotics 14 d        IV meropenem   ASSESSMENT: Patient with bilateral inguinal hernia underwent surgical repair for incarcerated hernia.  He also had pelvic abscess status post drainage with IR with a catheter drain still in place.  Culture grew Klebsiella and Bacteroides.  He finished 2 weeks of IV meropenem.    CT abdomen pelvis yesterday showed near complete resolution of the fluid collection posteriorly.  There is still a fluid collection in the left lower quadrant adjacent to the bowel anastomosis measuring 2 cm which is unchanged.  Otherwise no new abscess. Given the persistent fluid collection at the left lower quadrant, will continue p.o. antibiotics with Bactrim and Flagyl after discharge to ensure resolution.  PLAN: Stop meropenem Start Bactrim and Flagyl p.o. for 14 days.  Repeat CT after finishing antibiotic treatment to ensure resolution. Decision to remove drain per IR Can remove PICC line  Principal Problem:   Debility Active Problems:   Acute blood loss anemia   History of supraventricular tachycardia   Hyponatremia   Cholecystitis, acute   Scheduled Meds:  acetaminophen  325 mg Oral TID   apixaban  5 mg Oral BID   Chlorhexidine Gluconate Cloth  6 each Topical Daily   diltiazem  60 mg Oral Q8H   multivitamin  1 tablet Oral Daily   nutrition supplement (JUVEN)  1 packet Oral BID BM   pantoprazole  40 mg Oral Daily   Ensure Max Protein  11 oz Oral TID BM   senna  1 tablet Oral BID   sodium chloride flush  10-40 mL Intracatheter Q12H   Continuous Infusions:  sodium chloride 225 mL (05/30/21 1517)   PRN Meds:.sodium chloride, acetaminophen, alum & mag hydroxide-simeth, bisacodyl, diphenhydrAMINE, guaiFENesin-dextromethorphan, polyethylene glycol, prochlorperazine **OR** prochlorperazine **OR** prochlorperazine, sodium chloride flush, sodium phosphate,  traZODone   SUBJECTIVE: Patient with bilateral inguinal hernia who underwent surgical repair for incarcerated hernia.  Also found to have a pelvic abscess status post drainage.  He completed 14 days of IV meropenem.  Abscess culture grew Klebsiella and Bacteroides.  Patient is sitting up on the recliner chair during examination.  He appears comfortable and in no acute distress.  Patient denies fever, chills, abdominal pain.  He reports an episode of bowel movement this morning without any discomfort.  He reports some irritation of his skin on the back due to a new shirt that his wife was using a new dryer sheet.  Patient does not want to go home with IV antibiotics.   Review of Systems: ROS Per HPI  No Known Allergies  OBJECTIVE: Vitals:   06/02/21 1951 06/02/21 2134 06/03/21 0333 06/03/21 0601  BP:  114/81 (!) 85/68 117/86  Pulse: 100 61 97 97  Resp:   18 16  Temp:   98.1 F (36.7 C) 98.4 F (36.9 C)  TempSrc:    Oral  SpO2: 97%  95% 97%  Weight:      Height:       Body mass index is 23.34 kg/m.  Physical Exam Constitutional:      General: He is not in acute distress.    Appearance: He is not toxic-appearing.  HENT:     Head: Normocephalic.  Eyes:     Conjunctiva/sclera: Conjunctivae normal.  Cardiovascular:     Rate and Rhythm: Normal rate and regular rhythm.  Pulmonary:  Effort: Pulmonary effort is normal. No respiratory distress.     Breath sounds: Normal breath sounds.  Abdominal:     General: Bowel sounds are normal.     Comments: Abdomen soft and nontender to palpation.  Bowel sound present. Surgical scar well bandaged.  Posterior drain in place.  Skin:    General: Skin is warm.     Coloration: Skin is not jaundiced.     Comments: Erythema of his back skin bilaterally noted.  Blanchable.  Nontender to palpation.  Neurological:     Mental Status: He is alert.    Lab Results Lab Results  Component Value Date   WBC 14.5 (H) 06/02/2021   HGB 11.8  (L) 06/02/2021   HCT 36.6 (L) 06/02/2021   MCV 94.8 06/02/2021   PLT 351 06/02/2021    Lab Results  Component Value Date   CREATININE 0.74 06/02/2021   BUN 21 06/02/2021   NA 135 06/02/2021   K 4.1 06/02/2021   CL 97 (L) 06/02/2021   CO2 27 06/02/2021    Lab Results  Component Value Date   ALT 30 06/02/2021   AST 17 06/02/2021   ALKPHOS 139 (H) 06/02/2021   BILITOT 0.7 06/02/2021     Microbiology: No results found for this or any previous visit (from the past 240 hour(s)).  Gaylan Gerold, [facility] [phone] pager    06/03/2021, 10:27 AM

## 2021-06-03 NOTE — Progress Notes (Signed)
Patient ID: Gary Rice, male   DOB: 09-03-1931, 87 y.o.   MRN: [id number]  Met with pt who feels much better about going home tomorrow instead of today. He is aware has meet his goals and wants to learn dressing changes prior to leaving tomorrow.

## 2021-06-03 NOTE — Progress Notes (Signed)
Referring Physician(s): Dr. Grandville Silos  Supervising Physician: Corrie Mckusick  Patient Status:  [facility] - In-pt  Chief Complaint: Right inguinal hernia repair and small bowel resection with post-op pelvic abscess; S/p transgluteal pelvic abscess drain 05/13/21 by Dr. Dwaine Gale  Subjective: Patient in bed watching TV. 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   Allergies: Patient has no known allergies.  Medications: Prior to Admission medications   Medication Sig Start Date End Date Taking? Authorizing Provider  apixaban (ELIQUIS) 5 MG TABS tablet Take 2 tablets ('10mg'$ ) twice daily for 7 days, then 1 tablet ('5mg'$ ) twice daily 05/26/21   Charlynne Cousins, MD  apixaban (ELIQUIS) 5 MG TABS tablet Take 1 tablet (5 mg total) by mouth 2 (two) times daily. 06/03/21   Angiulli, Lavon Paganini, PA-C  aspirin EC 81 MG tablet Take 81 mg by mouth at bedtime.    [provider]  atorvastatin (LIPITOR) 10 MG tablet Take 10 mg by mouth at bedtime. 08/14/15   [provider]  diltiazem (CARDIZEM) 30 MG tablet TAKE 1 TABLET AS NEEDED FOR RAPID HEART BEAT Patient taking differently: Take 30 mg by mouth daily as needed (rapid heart beat). 10/06/17   Isaiah Serge, NP  diltiazem (CARDIZEM) 60 MG tablet Take 1 tablet (60 mg total) by mouth every 8 (eight) hours. 06/03/21   Angiulli, Lavon Paganini, PA-C  fluticasone (FLONASE) 50 MCG/ACT nasal spray Place 1 spray into both nostrils daily. 08/14/15   [provider]  Multiple Vitamin (MULTIVITAMIN WITH MINERALS) TABS tablet Take 1 tablet by mouth daily.    [provider]  Multiple Vitamins-Minerals (PRESERVISION AREDS 2) CAPS Take 1 capsule 2 (two) times daily by mouth.    [provider]  pantoprazole (PROTONIX) 40 MG tablet Take 1 tablet (40 mg total) by mouth daily. 06/03/21   Angiulli, Lavon Paganini, PA-C  senna (SENOKOT) 8.6 MG TABS tablet Take 1 tablet (8.6 mg total) by mouth 2 (two) times  daily. 06/03/21   Angiulli, Lavon Paganini, PA-C  VITAMIN E PO Take 1 capsule by mouth daily.     [provider]     Vital Signs: BP 117/86 (BP Location: Left Arm)   Pulse 97   Temp 98.4 F (36.9 C) (Oral)   Resp 16   Ht '5\' 11"'$  (1.803 m)   Wt 167 lb 5.3 oz (75.9 kg)   SpO2 97%   BMI 23.34 kg/m   Physical Exam Constitutional:      General: He is not in acute distress.    Appearance: Normal appearance. He is not ill-appearing.  Pulmonary:     Effort: Pulmonary effort is normal.  Abdominal:     Tenderness: There is no abdominal tenderness.     Comments: Right transgluteal drain to suction. Scant amount of serosanguineous fluid in bulb. Suture and stat-lock intact. Site is clean/dry.   Skin:    General: Skin is warm and dry.  Neurological:     Mental Status: He is alert and oriented to person, place, and time.    Imaging: CT ABDOMEN PELVIS W CONTRAST  Result Date: 06/02/2021 CLINICAL DATA:  Abdominal pain, acute, nonlocalized pt already HAS pelvic abscess- trying to decide next course with ABX or now- WBC still 14.5 EXAM: CT ABDOMEN AND PELVIS WITH CONTRAST TECHNIQUE: Multidetector CT imaging of the abdomen and pelvis was performed using the standard protocol following bolus administration of intravenous contrast. CONTRAST:  27m OMNIPAQUE IOHEXOL 350  MG/ML SOLN COMPARISON:  MRI May 20, 2021 and CT May 19, 2021 FINDINGS: Lower chest: No acute abnormality.  Coronary artery calcifications. Hepatobiliary: Scattered hepatic cysts. No solid enhancing hepatic lesion. Layering sludge and stones in the gallbladder. The gallbladder is no longer distended however there is pericholecystic fluid. Common bile duct is prominent with gentle tapering to the level of again ampullary, with the CBD measuring 8 mm which is within normal limits for patient's age but is slightly increased in comparison to prior MRI when it measured 6-7 mm. Pancreas: No evidence of acute inflammation or pancreatic  ductal dilation. Spleen: Within normal limits. Adrenals/Urinary Tract: Bilateral adrenal glands are unremarkable. 9.7 cm left upper pole renal cyst. Urinary bladder is unremarkable for degree of distension. Stomach/Bowel: Radiopaque enteric contrast traverses the cecum. No pathologic dilation of small or large bowel. Left lower quadrant anastomotic sutures with a small collection adjacent to the anastomosis measuring 2 cm on image 49/3 previously 2.3 cm. Vascular/Lymphatic: Aortic atherosclerosis without abdominal aortic aneurysm. No pathologically enlarged abdominal or pelvic lymph nodes. Reproductive: Prostatomegaly. Other: Significant decrease in size/near complete resolution of the fluid collection anterior to the rectum with posterior approach pelvic drainage catheter in place, the walls of the collection are now decompressed around the pigtail drainage catheter measuring approximately 3.3 x 2.1 cm previously 6.6 x 3.4 cm. No new walled off fluid collections. No pneumoperitoneum. Fat and nonobstructed small bowel containing left inguinal hernia. Fat and fluid containing right inguinal hernia. Musculoskeletal: Similar appearance of the postsurgical anterior abdominal wall cutaneous skin defect. Subcutaneous edema in the bilateral flanks and extending along the right anterior pelvic wall to the inguinal canal. Right hip arthroplasty. Multilevel degenerative changes spine. No acute osseous abnormality. IMPRESSION: 1. Significant decrease in size/near complete resolution of the fluid collection anterior to the rectum with posterior approach pelvic drainage catheter in place. The walls of the collection are now decompressed around the pigtail drainage catheter. 2. Similar size of the collection adjacent to the left lower quadrant bowel anastomotic sutures measuring 2 cm. No new walled off fluid collections. 3. Layering sludge and stones in the gallbladder with pericholecystic fluid. The gallbladder is no longer  distended however there is pericholecystic fluid. 4. Common bile duct is prominent with gentle tapering to the level of again ampullary, with the CBD measuring 8 mm which is within normal limits for patient's age but is slightly increased in comparison to prior MRI when it measured 6-7 mm. Suggest correlation with laboratory values for biliary obstruction. 5. Subcutaneous edema in the bilateral flanks and extending along the right anterior pelvic wall to the inguinal canal. Correlate for cellulitis. 6. Similar appearance of the postsurgical anterior abdominal wall cutaneous skin defect. 7. Fat and nonobstructed small bowel containing left inguinal hernia. 8. Fat and fluid containing right inguinal hernia. 9.  Aortic Atherosclerosis (ICD10-I70.0). Electronically Signed   By: Dahlia Bailiff M.D.   On: 06/02/2021 19:03    Labs:  CBC: Recent Labs    05/24/21 0945 05/26/21 0500 05/27/21 0500 06/02/21 0412  WBC 14.2* 14.3* 13.4* 14.5*  HGB 11.0* 12.2* 11.5* 11.8*  HCT 32.7* 36.2* 34.9* 36.6*  PLT 403* 404* 401* 351    COAGS: No results for input(s): INR, APTT in the last 8760 hours.  BMP: Recent Labs    05/26/21 0500 05/27/21 0500 05/29/21 0446 06/02/21 0412  NA 134* 134* 134* 135  K 4.3 3.9 3.9 4.1  CL 99 100 99 97*  CO2 '28 27 28 27  '$ GLUCOSE  113* 109* 109* 102*  BUN '17 20 19 21  '$ CALCIUM 9.4 8.9 8.8* 9.5  CREATININE 0.85 0.75 0.80 0.74  GFRNONAA >60 >60 >60 >60    LIVER FUNCTION TESTS: Recent Labs    05/26/21 0500 05/27/21 0500 05/29/21 0446 06/02/21 0412  BILITOT 0.8 1.0 1.1 0.7  AST '29 24 23 17  '$ ALT 102* 82* 55* 30  ALKPHOS 237* 213* 177* 139*  PROT 6.8 6.4* 6.3* 6.7  ALBUMIN 2.2* 2.3* 2.3* 2.5*    Assessment and Plan:  Right inguinal hernia repair and small bowel resection with post-op pelvic abscess; S/p transgluteal pelvic abscess drain 05/13/21 by Dr. Dwaine Gale  Patient is afebrile and WBC is consistent with prior values reported over the last two weeks. Drain output  has decreased to 15 ml in the past 24 hours and CT imaging 06/02/21 shows resolution of the fluid collection. Per Infectious Disease note today the patient will be transitioned to oral antibiotics. The right transgluteal drain was removed at the bedside today and the site was covered with gauze. The patient is aware the site may continue to drain for a few days and to keep the site clean/dry. The site should not be submerged in water until healed. Please call IR with any questions.   Electronically Signed: Soyla Dryer, AGACNP-BC [phone] 06/03/2021, 11:59 AM   I spent a total of 15 Minutes at the the patient's bedside AND on the patient's hospital floor or unit, greater than 50% of which was counseling/coordinating care for pelvic abscess drain.

## 2021-06-03 NOTE — Patient Care Conference (Signed)
Inpatient RehabilitationTeam Conference and Plan of Care Update Date: 06/03/2021   Time: 12:02 PM    Patient Name: Gary Rice      Medical Record Number: [id number]  Date of Birth: September 16, 1931 Sex: Male         Room/Bed: 4M02C/4M02C-01 Payor Info: Payor: MEDICARE / Plan: MEDICARE PART A AND B / Product Type: *No Product type* /    Admit Date/Time:  05/26/2021  5:46 PM  Primary Diagnosis:  [facility] Problems: Principal Problem:   Debility Active Problems:   Acute blood loss anemia   History of supraventricular tachycardia   Hyponatremia   Cholecystitis, acute    Expected Discharge Date: Expected Discharge Date: 06/04/21  Team Members Present: Physician leading conference: Dr. Courtney Heys Social Worker Present: Ovidio Kin, LCSW Nurse Present: Dorien Chihuahua, RN PT Present: Francena Hanly, PT OT Present: Meriel Pica, OT PPS Coordinator present : Gunnar Fusi, SLP     Current Status/Progress Goal Weekly Team Focus  Bowel/Bladder   Continent of bowel and bladder. LBM 9/5  Remain continent  Assess Qshift and PRN   Swallow/Nutrition/ Hydration             ADL's   UB/LB dressing set up A; toileting mod I-distant spvsn; mod I grooming in stance, functional ambulation with SPC spvsn-CGA due to fatigue with multiple rest breaks  mod I  endurance, energy conservation, conditioning, ADLs/IADLs, AE/DME, safety/fall prevention   Mobility   Mod I mobility, transfers, bed mobility  Mod I  Dynamic balance, global deconditioning, minimize fall risk   Communication             Safety/Cognition/ Behavioral Observations            Pain   No c/o pain  Remain pain free  Assess Qshift and PRN   Skin   Open wound to abdomen. Qshift dressing changes. JP drain to right flank.  Promote healing and prevention of infection.  Assess Qshift and PRN     Discharge Planning:  Home tomorrow with wife who is there, but can not assist him. Will need education on drain and  dressing changes but will have HHRN at home   Team Discussion: Discharge on hold pending information on drain from IR and treatment of elevated WBC level. Constipation also addressed. CT abd noted near resolution of abd fluid and ID adjusted antibiotics per MD.  Patient on target to meet rehab goals: yes, currently mod I for self care and demonstrates good safety awareness. Patient hopeful drain will be pulled prior to discharge and only have dressing changes to complete.  *See Care Plan and progress notes for long and short-term goals.   Revisions to Treatment Plan:  Hold discharge pending new orders/treatments   Teaching Needs: Dressing changes, Drain flush/care, medications, safety, etc  Current Barriers to Discharge: Home enviroment access/layout, Wound care, and Lack of/limited family support  Possible Resolutions to Barriers: Patient education HH PT and RN follow up services     Medical Summary Current Status: conitnent B/B; doesn't like Protein shakes; wil have to do drain flushes himself; abd dressings- to do himself/family- will have H/H- nurse  Barriers to Discharge: Home enviroment access/layout;IV antibiotics  Barriers to Discharge Comments: completed IV ABX- and changing to PO ABX- and doing wound care himself; Possible Resolutions to [employer] Focus: focus- getting PICC out and IR to get drain out possibly? d/c 06/04/21 met his goals with therapy   Continued Need for Acute Rehabilitation Level of Care: The  patient requires daily medical management by a physician with specialized training in physical medicine and rehabilitation for the following reasons: Direction of a multidisciplinary physical rehabilitation program to maximize functional independence : Yes Medical management of patient stability for increased activity during participation in an intensive rehabilitation regime.: Yes Analysis of laboratory values and/or radiology reports with any subsequent need for  medication adjustment and/or medical intervention. : Yes   I attest that I was present, lead the team conference, and concur with the assessment and plan of the team.   Dorien Chihuahua B 06/03/2021, 3:58 PM

## 2021-06-03 NOTE — Progress Notes (Signed)
Physical Therapy Session Note  Patient Details  Name: Gary Rice MRN: [id number] Date of Birth: [birth year]  Today's Date: 06/02/2021 PT Individual Time: 1406-1506 PT Individual Time Calculation (min): 60 min   Short Term Goals: Week 1:  PT Short Term Goal 1 (Week 1): STG = LTG due to LOS  Skilled Therapeutic Interventions/Progress Updates:  Pt received sitting in recliner in room, reported high level of fatigue and general discomfort in abdomen, denied pain. Pt performed sit <>stands throughout session w/SPC mod I. Pt ambulated >200' mod I w/SPC and ascended/descended 12 6" steps w/BUE support on rails and step-through pattern mod I. Retested pt on Berg, pt scored 47/56 with no AD, improved 14 points since previous test. Discussed results interpretation w/pt and pt very pleased with progress in therapy. Pt verbalized urge to void and attempt BM, ambulated 150' mod I w/SPC and performed toilet transfer mod I. Pt voided continently and performed peri care independently, unable to have BM. Pt ambulated from bathroom to bed mod I w/SPC and performed bed mobility mod I w/bedrails. Pt was left supine in bed, all needs in reach.   Therapy Documentation Precautions:  Precautions Precautions: Fall Precaution Comments: JP drain R abdomen Restrictions Weight Bearing Restrictions: No   Therapy/Group: Individual Therapy Cruzita Lederer Gladstone Rosas, PT, DPT  06/02/2021, 4:47pm

## 2021-06-04 ENCOUNTER — Encounter (HOSPITAL_COMMUNITY): Payer: Self-pay

## 2021-06-04 DIAGNOSIS — D72829 Elevated white blood cell count, unspecified: Secondary | ICD-10-CM

## 2021-06-04 MED ORDER — DILTIAZEM HCL ER COATED BEADS 180 MG PO CP24: 180.0000 mg | ORAL_CAPSULE | Freq: Every day | ORAL | 2 refills | Status: DC

## 2021-06-04 NOTE — Progress Notes (Signed)
Patient has refused cardizem for last 4 days due to concerns of low BP and does not plan on taking it at home. BP/HR have been controlled and he knows how to check  BP (has a cuff) and HR due to h/o of SVT. He was agreeable to use med prn HR>100 or SBP>180.

## 2021-06-04 NOTE — Progress Notes (Signed)
PROGRESS NOTE   Subjective/Complaints: Patient seen sitting up in bed this AM.  He states he slept well overnight and he is excited for discharge.   ROS: Denies CP, SOB, N/V/D  Objective:   CT ABDOMEN PELVIS W CONTRAST  Result Date: 06/02/2021 CLINICAL DATA:  Abdominal pain, acute, nonlocalized pt already HAS pelvic abscess- trying to decide next course with ABX or now- WBC still 14.5 EXAM: CT ABDOMEN AND PELVIS WITH CONTRAST TECHNIQUE: Multidetector CT imaging of the abdomen and pelvis was performed using the standard protocol following bolus administration of intravenous contrast. CONTRAST:  6m OMNIPAQUE IOHEXOL 350 MG/ML SOLN COMPARISON:  MRI May 20, 2021 and CT May 19, 2021 FINDINGS: Lower chest: No acute abnormality.  Coronary artery calcifications. Hepatobiliary: Scattered hepatic cysts. No solid enhancing hepatic lesion. Layering sludge and stones in the gallbladder. The gallbladder is no longer distended however there is pericholecystic fluid. Common bile duct is prominent with gentle tapering to the level of again ampullary, with the CBD measuring 8 mm which is within normal limits for patient's age but is slightly increased in comparison to prior MRI when it measured 6-7 mm. Pancreas: No evidence of acute inflammation or pancreatic ductal dilation. Spleen: Within normal limits. Adrenals/Urinary Tract: Bilateral adrenal glands are unremarkable. 9.7 cm left upper pole renal cyst. Urinary bladder is unremarkable for degree of distension. Stomach/Bowel: Radiopaque enteric contrast traverses the cecum. No pathologic dilation of small or large bowel. Left lower quadrant anastomotic sutures with a small collection adjacent to the anastomosis measuring 2 cm on image 49/3 previously 2.3 cm. Vascular/Lymphatic: Aortic atherosclerosis without abdominal aortic aneurysm. No pathologically enlarged abdominal or pelvic lymph nodes. Reproductive:  Prostatomegaly. Other: Significant decrease in size/near complete resolution of the fluid collection anterior to the rectum with posterior approach pelvic drainage catheter in place, the walls of the collection are now decompressed around the pigtail drainage catheter measuring approximately 3.3 x 2.1 cm previously 6.6 x 3.4 cm. No new walled off fluid collections. No pneumoperitoneum. Fat and nonobstructed small bowel containing left inguinal hernia. Fat and fluid containing right inguinal hernia. Musculoskeletal: Similar appearance of the postsurgical anterior abdominal wall cutaneous skin defect. Subcutaneous edema in the bilateral flanks and extending along the right anterior pelvic wall to the inguinal canal. Right hip arthroplasty. Multilevel degenerative changes spine. No acute osseous abnormality. IMPRESSION: 1. Significant decrease in size/near complete resolution of the fluid collection anterior to the rectum with posterior approach pelvic drainage catheter in place. The walls of the collection are now decompressed around the pigtail drainage catheter. 2. Similar size of the collection adjacent to the left lower quadrant bowel anastomotic sutures measuring 2 cm. No new walled off fluid collections. 3. Layering sludge and stones in the gallbladder with pericholecystic fluid. The gallbladder is no longer distended however there is pericholecystic fluid. 4. Common bile duct is prominent with gentle tapering to the level of again ampullary, with the CBD measuring 8 mm which is within normal limits for patient's age but is slightly increased in comparison to prior MRI when it measured 6-7 mm. Suggest correlation with laboratory values for biliary obstruction. 5. Subcutaneous edema in the bilateral flanks and extending along the right  anterior pelvic wall to the inguinal canal. Correlate for cellulitis. 6. Similar appearance of the postsurgical anterior abdominal wall cutaneous skin defect. 7. Fat and  nonobstructed small bowel containing left inguinal hernia. 8. Fat and fluid containing right inguinal hernia. 9.  Aortic Atherosclerosis (ICD10-I70.0). Electronically Signed   By: Dahlia Bailiff M.D.   On: 06/02/2021 19:03   Recent Labs    06/02/21 0412  WBC 14.5*  HGB 11.8*  HCT 36.6*  PLT 351     Recent Labs    06/02/21 0412  NA 135  K 4.1  CL 97*  CO2 27  GLUCOSE 102*  BUN 21  CREATININE 0.74  CALCIUM 9.5     Intake/Output Summary (Last 24 hours) at 06/04/2021 1145 Last data filed at 06/04/2021 0820 Gross per 24 hour  Intake 440 ml  Output 575 ml  Net -135 ml         Physical Exam: Vital Signs Blood pressure 108/66, pulse 93, temperature 98 F (36.7 C), temperature source Oral, resp. rate 18, height '5\' 11"'$  (1.803 m), weight 75.9 kg, SpO2 92 %. Constitutional: No distress . Vital signs reviewed. HENT: Normocephalic.  Atraumatic. Eyes: EOMI. No discharge. Cardiovascular: No JVD.  RRR. Respiratory: Normal effort.  No stridor.  Bilateral clear to auscultation. GI: Non-distended.  BS +. Skin: Warm and dry.  Wound with granulation tissue. Psych: Normal mood.  Normal behavior. Musc: No edema in extremities.  No tenderness in extremities. Neurological: Alert Motor: Grossly 4+/5 throughout, stable  Assessment/Plan: 1. Functional deficits which require 3+ hours per day of interdisciplinary therapy in a comprehensive inpatient rehab setting. Physiatrist is providing close team supervision and 24 hour management of active medical problems listed below. Physiatrist and rehab team continue to assess barriers to discharge/monitor patient progress toward functional and medical goals  Care Tool:  Bathing    Body parts bathed by patient: Right arm, Left arm, Chest, Abdomen, Buttocks, Right upper leg, Left upper leg, Right lower leg, Left lower leg, Face     Body parts n/a: Front perineal area   Bathing assist Assist Level: Independent with assistive device     Upper  Body Dressing/Undressing Upper body dressing   What is the patient wearing?: Pull over shirt    Upper body assist Assist Level: Independent    Lower Body Dressing/Undressing Lower body dressing      What is the patient wearing?: Pants     Lower body assist Assist for lower body dressing: Independent with assitive device     Toileting Toileting    Toileting assist Assist for toileting: Independent with assistive device Assistive Device Comment: urinal   Transfers Chair/bed transfer  Transfers assist     Chair/bed transfer assist level: Independent with assistive device Chair/bed transfer assistive device: Research officer, political party   Ambulation assist      Assist level: Independent with assistive device Assistive device: Cane-straight Max distance: >200'   Walk 10 feet activity   Assist     Assist level: Independent with assistive device Assistive device: Cane-straight   Walk 50 feet activity   Assist    Assist level: Independent with assistive device Assistive device: Cane-straight    Walk 150 feet activity   Assist    Assist level: Independent with assistive device Assistive device: Cane-straight    Walk 10 feet on uneven surface  activity   Assist Walk 10 feet on uneven surfaces activity did not occur: Safety/medical concerns (Fatigue)   Assist level: Independent with assistive  device Assistive device: Hospital doctor     Assist Is the patient using a wheelchair?: No             Wheelchair 50 feet with 2 turns activity    Assist            Wheelchair 150 feet activity     Assist          Blood pressure 108/66, pulse 93, temperature 98 F (36.7 C), temperature source Oral, resp. rate 18, height '5\' 11"'$  (1.803 m), weight 75.9 kg, SpO2 92 %.  Medical Problem List and Plan: 1.  Debility secondary to pelvic abscess  Continue CIR 2.  Antithrombotics: -DVT/anticoagulation:   Pharmaceutical: Other (comment) Eliquis started   -antiplatelet therapy: N/A 3. Post-operative pain:   Continue Tylenol  325 mg tid for pain.  Controlled on 9/7 4. Mood: LCSW to follow for evaluation and support.              -antipsychotic agents: N/A 5. Neuropsych: This patient is capable of making decisions on his own behalf. 6. Skin/Wound Care: Dressing changes to midline wound.              Continue ensure TID to promote wound healing 7. Fluids/Electrolytes/Nutrition: good appetite 8. Cholecystitis:   LFTs nearly normal 9/1 --Decreased tylenol to 325 mg TID.  --Continue meropenem X 2 weeks w/end date 09/05, transition to oral Bactrim and metronidazole x14 days per ID 9. Pelvic abscess a/p drain placement: Cultures +Kleb oxytoca--> meropenem(S) through 9/5 10.  Mild hyponatremia:   Sodium 134 on 9/1 11. Leucocytosis:  WBC 14.5 on 9/5 Continue to monitor for fevers and other signs of infection.  12. Low calorie malnutrition: Alb-2.2. added juven additionally to help promote wound healing.   -eating well  13.  Pre-diabetes: Hgb A1C-5.8. Fasting BS reasonable. Changed Ensure to Ensure Max but pt doesn't like them.  Monitor with increased exertion 14. H/o  SVT/New onset AFib: Monitor HR TID Eliquis bid             --off statin due to Abnormal LFTs.  Reduced Diltiazem to 60 mg on 9/5 15. Acute blood loss anemia: added MVI with iron supplement   Hemoglobin 11.8 on 9/5  Continue to monitor   LOS: 9 days A FACE TO FACE EVALUATION WAS PERFORMED  Braylin Xu Lorie Phenix 06/04/2021, 11:45 AM

## 2021-06-05 NOTE — Plan of Care (Signed)
  Problem: Sit to Stand Goal: LTG:  Patient will perform sit to stand with assistance level (PT) Description: LTG:  Patient will perform sit to stand with assistance level (PT) Outcome: Completed/Met   Problem: RH Bed Mobility Goal: LTG Patient will perform bed mobility with assist (PT) Description: LTG: Patient will perform bed mobility with assistance, with/without cues (PT). Outcome: Completed/Met   Problem: RH Bed to Chair Transfers Goal: LTG Patient will perform bed/chair transfers w/assist (PT) Description: LTG: Patient will perform bed to chair transfers with assistance (PT). Outcome: Completed/Met   Problem: RH Car Transfers Goal: LTG Patient will perform car transfers with assist (PT) Description: LTG: Patient will perform car transfers with assistance (PT). Outcome: Completed/Met   Problem: RH Ambulation Goal: LTG Patient will ambulate in controlled environment (PT) Description: LTG: Patient will ambulate in a controlled environment, # of feet with assistance (PT). Outcome: Completed/Met Goal: LTG Patient will ambulate in home environment (PT) Description: LTG: Patient will ambulate in home environment, # of feet with assistance (PT). Outcome: Completed/Met   Problem: RH Stairs Goal: LTG Patient will ambulate up and down stairs w/assist (PT) Description: LTG: Patient will ambulate up and down # of stairs with assistance (PT) Outcome: Completed/Met

## 2021-06-06 DIAGNOSIS — D62 Acute posthemorrhagic anemia: Secondary | ICD-10-CM | POA: Diagnosis not present

## 2021-06-06 DIAGNOSIS — Z96641 Presence of right artificial hip joint: Secondary | ICD-10-CM | POA: Diagnosis not present

## 2021-06-06 DIAGNOSIS — I712 Thoracic aortic aneurysm, without rupture: Secondary | ICD-10-CM | POA: Diagnosis not present

## 2021-06-06 DIAGNOSIS — E46 Unspecified protein-calorie malnutrition: Secondary | ICD-10-CM | POA: Diagnosis not present

## 2021-06-06 DIAGNOSIS — R7303 Prediabetes: Secondary | ICD-10-CM | POA: Diagnosis not present

## 2021-06-06 DIAGNOSIS — I700 Atherosclerosis of aorta: Secondary | ICD-10-CM | POA: Diagnosis not present

## 2021-06-06 DIAGNOSIS — I4891 Unspecified atrial fibrillation: Secondary | ICD-10-CM | POA: Diagnosis not present

## 2021-06-06 DIAGNOSIS — K651 Peritoneal abscess: Secondary | ICD-10-CM | POA: Diagnosis not present

## 2021-06-06 DIAGNOSIS — E785 Hyperlipidemia, unspecified: Secondary | ICD-10-CM | POA: Diagnosis not present

## 2021-06-06 DIAGNOSIS — Z7901 Long term (current) use of anticoagulants: Secondary | ICD-10-CM | POA: Diagnosis not present

## 2021-06-08 ENCOUNTER — Inpatient Hospital Stay (HOSPITAL_COMMUNITY)
Admission: EM | Admit: 2021-06-08 | Discharge: 2021-06-10 | DRG: 389 | Disposition: A | Discharge status: Home Health | Payer: Medicare Other | Attending: Internal Medicine | Admitting: Internal Medicine

## 2021-06-08 ENCOUNTER — Other Ambulatory Visit: Payer: Self-pay

## 2021-06-08 ENCOUNTER — Encounter (HOSPITAL_COMMUNITY): Payer: Self-pay | Admitting: Emergency Medicine

## 2021-06-08 ENCOUNTER — Emergency Department (HOSPITAL_COMMUNITY): Payer: Medicare Other

## 2021-06-08 DIAGNOSIS — K8000 Calculus of gallbladder with acute cholecystitis without obstruction: Secondary | ICD-10-CM | POA: Diagnosis present

## 2021-06-08 DIAGNOSIS — Z79899 Other long term (current) drug therapy: Secondary | ICD-10-CM

## 2021-06-08 DIAGNOSIS — I712 Thoracic aortic aneurysm, without rupture: Secondary | ICD-10-CM | POA: Diagnosis present

## 2021-06-08 DIAGNOSIS — Z9049 Acquired absence of other specified parts of digestive tract: Secondary | ICD-10-CM

## 2021-06-08 DIAGNOSIS — E871 Hypo-osmolality and hyponatremia: Secondary | ICD-10-CM | POA: Diagnosis present

## 2021-06-08 DIAGNOSIS — Z87891 Personal history of nicotine dependence: Secondary | ICD-10-CM

## 2021-06-08 DIAGNOSIS — Z7901 Long term (current) use of anticoagulants: Secondary | ICD-10-CM

## 2021-06-08 DIAGNOSIS — Z20822 Contact with and (suspected) exposure to covid-19: Secondary | ICD-10-CM | POA: Diagnosis present

## 2021-06-08 DIAGNOSIS — I4891 Unspecified atrial fibrillation: Secondary | ICD-10-CM | POA: Diagnosis not present

## 2021-06-08 DIAGNOSIS — K567 Ileus, unspecified: Principal | ICD-10-CM | POA: Diagnosis present

## 2021-06-08 DIAGNOSIS — Z85828 Personal history of other malignant neoplasm of skin: Secondary | ICD-10-CM | POA: Diagnosis not present

## 2021-06-08 DIAGNOSIS — E785 Hyperlipidemia, unspecified: Secondary | ICD-10-CM | POA: Diagnosis present

## 2021-06-08 DIAGNOSIS — D72828 Other elevated white blood cell count: Secondary | ICD-10-CM | POA: Diagnosis present

## 2021-06-08 DIAGNOSIS — Z8249 Family history of ischemic heart disease and other diseases of the circulatory system: Secondary | ICD-10-CM

## 2021-06-08 DIAGNOSIS — I480 Paroxysmal atrial fibrillation: Secondary | ICD-10-CM | POA: Diagnosis present

## 2021-06-08 DIAGNOSIS — K6389 Other specified diseases of intestine: Secondary | ICD-10-CM | POA: Diagnosis not present

## 2021-06-08 DIAGNOSIS — N400 Enlarged prostate without lower urinary tract symptoms: Secondary | ICD-10-CM | POA: Diagnosis not present

## 2021-06-08 DIAGNOSIS — N281 Cyst of kidney, acquired: Secondary | ICD-10-CM | POA: Diagnosis not present

## 2021-06-08 DIAGNOSIS — K4090 Unilateral inguinal hernia, without obstruction or gangrene, not specified as recurrent: Secondary | ICD-10-CM | POA: Diagnosis not present

## 2021-06-08 LAB — CBC WITH DIFFERENTIAL/PLATELET
Abs Immature Granulocytes: 0.08 10*3/uL — ABNORMAL HIGH (ref 0.00–0.07)
Basophils Absolute: 0.1 10*3/uL (ref 0.0–0.1)
Basophils Relative: 1 %
Eosinophils Absolute: 0.3 10*3/uL (ref 0.0–0.5)
Eosinophils Relative: 2 %
HCT: 35 % — ABNORMAL LOW (ref 39.0–52.0)
Hemoglobin: 11.5 g/dL — ABNORMAL LOW (ref 13.0–17.0)
Immature Granulocytes: 1 %
Lymphocytes Relative: 18 %
Lymphs Abs: 3 10*3/uL (ref 0.7–4.0)
MCH: 30.3 pg (ref 26.0–34.0)
MCHC: 32.9 g/dL (ref 30.0–36.0)
MCV: 92.3 fL (ref 80.0–100.0)
Monocytes Absolute: 1.4 10*3/uL — ABNORMAL HIGH (ref 0.1–1.0)
Monocytes Relative: 9 %
Neutro Abs: 11.5 10*3/uL — ABNORMAL HIGH (ref 1.7–7.7)
Neutrophils Relative %: 69 %
Platelets: 304 10*3/uL (ref 150–400)
RBC: 3.79 MIL/uL — ABNORMAL LOW (ref 4.22–5.81)
RDW: 14 % (ref 11.5–15.5)
WBC: 16.3 10*3/uL — ABNORMAL HIGH (ref 4.0–10.5)
nRBC: 0 % (ref 0.0–0.2)

## 2021-06-08 LAB — URINALYSIS, ROUTINE W REFLEX MICROSCOPIC
Bilirubin Urine: NEGATIVE
Glucose, UA: NEGATIVE mg/dL
Ketones, ur: NEGATIVE mg/dL
Nitrite: NEGATIVE
Protein, ur: 30 mg/dL — AB
Specific Gravity, Urine: >1.03 — ABNORMAL HIGH (ref 1.005–1.030)
pH: 6 (ref 5.0–8.0)

## 2021-06-08 LAB — COMPREHENSIVE METABOLIC PANEL
ALT: 24 U/L (ref 0–44)
AST: 22 U/L (ref 15–41)
Albumin: 3.1 g/dL — ABNORMAL LOW (ref 3.5–5.0)
Alkaline Phosphatase: 113 U/L (ref 38–126)
Anion gap: 9 (ref 5–15)
BUN: 21 mg/dL (ref 8–23)
CO2: 19 mmol/L — ABNORMAL LOW (ref 22–32)
Calcium: 9.2 mg/dL (ref 8.9–10.3)
Chloride: 104 mmol/L (ref 98–111)
Creatinine, Ser: 1.08 mg/dL (ref 0.61–1.24)
GFR, Estimated: >60 mL/min (ref >60)
Glucose, Bld: 101 mg/dL — ABNORMAL HIGH (ref 70–99)
Potassium: 4 mmol/L (ref 3.5–5.1)
Sodium: 132 mmol/L — ABNORMAL LOW (ref 135–145)
Total Bilirubin: 0.8 mg/dL (ref 0.3–1.2)
Total Protein: 7.6 g/dL (ref 6.5–8.1)

## 2021-06-08 LAB — URINALYSIS, MICROSCOPIC (REFLEX)
RBC / HPF: >50 RBC/hpf (ref 0–5)
WBC, UA: >50 WBC/hpf (ref 0–5)

## 2021-06-08 LAB — APTT: aPTT: 30 seconds (ref 24–36)

## 2021-06-08 LAB — RESP PANEL BY RT-PCR (FLU A&B, COVID) ARPGX2
Influenza A by PCR: NEGATIVE
Influenza B by PCR: NEGATIVE
SARS Coronavirus 2 by RT PCR: NEGATIVE

## 2021-06-08 LAB — MAGNESIUM: Magnesium: 1.9 mg/dL (ref 1.7–2.4)

## 2021-06-08 LAB — PROTIME-INR
INR: 1.2 (ref 0.8–1.2)
Prothrombin Time: 15 seconds (ref 11.4–15.2)

## 2021-06-08 LAB — HEPARIN LEVEL (UNFRACTIONATED): Heparin Unfractionated: >1.1 IU/mL — ABNORMAL HIGH (ref 0.30–0.70)

## 2021-06-08 MED ORDER — LACTATED RINGERS IV SOLN: INTRAVENOUS | Status: DC

## 2021-06-08 MED ORDER — ONDANSETRON HCL 4 MG/2ML IJ SOLN: 4.0000 mg | Freq: Four times a day (QID) | INTRAMUSCULAR | Status: DC | PRN

## 2021-06-08 MED ORDER — SODIUM CHLORIDE 0.9 % IV SOLN
1.0000 g | Freq: Three times a day (TID) | INTRAVENOUS | Status: DC
  Administered 2021-06-08 – 2021-06-09 (×2): 1 g via INTRAVENOUS
  Filled 2021-06-08 (×4): qty 1

## 2021-06-08 MED ORDER — DILTIAZEM HCL 25 MG/5ML IV SOLN
10.0000 mg | Freq: Once | INTRAVENOUS | Status: AC
  Administered 2021-06-08: 10 mg via INTRAVENOUS
  Filled 2021-06-08: qty 5

## 2021-06-08 MED ORDER — HEPARIN (PORCINE) 25000 UT/250ML-% IV SOLN
1200.0000 [IU]/h | INTRAVENOUS | Status: DC
  Administered 2021-06-08: 1000 [IU]/h via INTRAVENOUS
  Filled 2021-06-08: qty 250

## 2021-06-08 MED ORDER — ACETAMINOPHEN 325 MG PO TABS: 650.0000 mg | ORAL_TABLET | Freq: Four times a day (QID) | ORAL | Status: DC | PRN

## 2021-06-08 MED ORDER — ONDANSETRON HCL 4 MG PO TABS: 4.0000 mg | ORAL_TABLET | Freq: Four times a day (QID) | ORAL | Status: DC | PRN

## 2021-06-08 MED ORDER — SODIUM CHLORIDE 0.9 % IV BOLUS
1000.0000 mL | Freq: Once | INTRAVENOUS | Status: AC
  Administered 2021-06-08: 1000 mL via INTRAVENOUS

## 2021-06-08 MED ORDER — IOHEXOL 350 MG/ML SOLN
75.0000 mL | Freq: Once | INTRAVENOUS | Status: AC | PRN
  Administered 2021-06-08: 75 mL via INTRAVENOUS

## 2021-06-08 MED ORDER — ACETAMINOPHEN 650 MG RE SUPP: 650.0000 mg | Freq: Four times a day (QID) | RECTAL | Status: DC | PRN

## 2021-06-08 NOTE — Progress Notes (Signed)
ANTICOAGULATION CONSULT NOTE - Initial Consult  Pharmacy Consult for Heparin Indication: atrial fibrillation  No Known Allergies  Patient Measurements:   Heparin Dosing Weight: 75.9 kg  Vital Signs: Temp: 99.6 F (37.6 C) (09/11 1259) Temp Source: Oral (09/11 1259) BP: 115/79 (09/11 1815) Pulse Rate: 98 (09/11 1815)  Labs: Recent Labs    06/08/21 1316  HGB 11.5*  HCT 35.0*  PLT 304  CREATININE 1.08    Estimated Creatinine Clearance: 50.4 mL/min (by C-G formula based on SCr of 1.08 mg/dL).   Medical History: Past Medical History:  Diagnosis Date   Astigmatism    Basal cell carcinoma    Inguinal hernia    bilateral   Macular degeneration, age related, nonexudative    bilateral   SVT (supraventricular tachycardia) (HCC)     Medications:  (Not in a hospital admission)  Scheduled:  Infusions:   lactated ringers     PRN: acetaminophen **OR** acetaminophen, ondansetron **OR** ondansetron (ZOFRAN) IV  Assessment: 27 yom with a history of SVT, hyperlipidemia, atrial fibrillation, small bowel obstruction, s/p intervention on 05/03/2021 with mesh placement. Heparin per pharmacy consult placed for atrial fibrillation.  Patient is on  apixaban prior to arrival. Last dose 9/11 @ 2100. Will require aPTT monitoring due to likely falsely high anti-Xa level secondary to DOAC use.  Hgb11.5;plt 304  Goal of Therapy:  Heparin level 0.3-0.7 units/ml Heparin level 66-102 units/ml Monitor platelets by anticoagulation protocol: Yes   Plan:  No initial heparin bolus Start heparin infusion at 1000 units/hr at 2100 tonight Check aPTT & anti-Xa level in 8 hours and daily while on heparin Continue to monitor via aPTT until levels are correlated Continue to monitor H&H and platelets  Lorelei Pont, PharmD, BCPS 06/08/2021 7:07 PM ED Clinical Pharmacist -  [phone]

## 2021-06-08 NOTE — ED Notes (Signed)
Pt to CT

## 2021-06-08 NOTE — Progress Notes (Signed)
Pharmacy Antibiotic Note  Gary Rice is a 87 y.o. male admitted on 06/08/2021 with  intra-abdominal infection .  Pharmacy has been consulted for meropenem dosing.  63 yom with a history of SVT, bilateral inguinal hernias complicated by incarceration with small bowel obstruction in August 2022, paroxysmal A. fib, now on Eliquis.  Recent history of pelvic abscess status post drainage with IR.  Culture grew Klebsiella and Bacteroides.  Completed a 2 week of IV meropenem. ID transitioned to PO Bactrim and Flagyl with scheduled f/u with ID on 9/21.  Plan: Meropenem 1g IV q8h Trend WBC, fever, renal function F/u repeat cultures F/u surgery recs F/u ID consultation  Height: '5\' 11"'$  (180.3 cm) Weight: 75.9 kg (167 lb 5.3 oz) IBW/kg (Calculated) : 75.3  Temp (24hrs), Avg:99.6 F (37.6 C), Min:99.6 F (37.6 C), Max:99.6 F (37.6 C)  Recent Labs  Lab 06/02/21 0412 06/08/21 1316  WBC 14.5* 16.3*  CREATININE 0.74 1.08    Estimated Creatinine Clearance: 50.4 mL/min (by C-G formula based on SCr of 1.08 mg/dL).    No Known Allergies  Antimicrobials this admission: Meropenem 9/11 >>   Microbiology results: Pending  Thank you for allowing pharmacy to be a part of this patient's care.  Heloise Purpura 06/08/2021 7:15 PM

## 2021-06-08 NOTE — ED Triage Notes (Signed)
Pt discharged on 9/7 after surgery for SBO/incarcerated hernia.  Reports decreased output- bowel and bladder since then.  States he last urinated 3 hours ago that was a "normal" amount and small bowel movement.  Denies pain.

## 2021-06-08 NOTE — ED Provider Notes (Signed)
[facility] Provider Triage Evaluation Note  Zuhaib Qualley [facility] , a 87 y.o. male  was evaluated in triage.  Pt complains of decreased flatus and movements, decreased urine output, status post discharge in 9/7 after explore laparotomy for small bowel obstruction and incarcerated hernia.  Review of Systems  Positive: Difficulty passing bowel movements, decreased flatus, decreased urination Negative: Urinary frequency or urgency, chest pain or shortness of breath, abdominal pain, nausea, vomiting  Physical Exam  BP 123/75 (BP Location: Left Arm)   Pulse 65   Temp 99.6 F (37.6 C) (Oral)   Resp 16   SpO2 95%  Gen:   Awake, no distress   Resp:  Normal effort  MSK:   Moves extremities without difficulty  Other:  Well-healing surgical scar in the central abdomen.  Nondistended, soft, nontender to palpation in the abdomen.  Decreased bowel sounds, large left inguinal hernia palpable through patient's clothing.  Medical Decision Making  Medically screening exam initiated at 1:23 PM.  Appropriate orders placed.  [facility] was informed that the remainder of the evaluation will be completed by another provider, this initial triage assessment does not replace that evaluation, and the importance of remaining in the ED until their evaluation is complete.  This chart was dictated using voice recognition software, Dragon. Despite the best efforts of this provider to proofread and correct errors, errors may still occur which can change documentation meaning.    Aura Dials 06/08/21 1329    Margette Fast, MD 06/15/21 1724

## 2021-06-08 NOTE — ED Provider Notes (Signed)
[facility] EMERGENCY DEPARTMENT Provider Note   CSN: [id number] Arrival date & time: 06/08/21  1225     History CC:  Difficulty with bowel movements   Gary Rice is a 87 y.o. male with history of SVT, bilateral inguinal hernias complicated by incarceration with small bowel obstruction in August 2022, paroxysmal A. fib, now on Eliquis, presenting to emergency department with difficulty with bowel movements.  The patient reports that he was doing okay after leaving the hospital in terms of bowel movements, but about 2 weeks ago he began having worsening difficulties.  He says for the past few days he has had extremely small bowel movements.  He says it feels like "just a little bit will fall out".  He does not feel he is passing gas.  He denies nausea or vomiting.  He reports he is able to tolerate food and water.  He denies any abdominal pain, but states he also did not have any pain in the past with his incarcerated hernia.  He reports he has been compliant with his antibiotics.  He is currently taking a prolonged course of Flagyl and Bactrim for 14 days, and is also taking Augmentin for 5 days.  He reports he is also had a weak urinary stream recently.  HPI     Past Medical History:  Diagnosis Date   Astigmatism    Basal cell carcinoma    Inguinal hernia    bilateral   Macular degeneration, age related, nonexudative    bilateral   SVT (supraventricular tachycardia) [facility])     Patient Active Problem List   Diagnosis Date Noted   Cholecystitis, acute    Acute blood loss anemia    History of supraventricular tachycardia    Hyponatremia    Debility 05/26/2021   Pelvic abscess in male [facility]) 05/14/2021   Malnutrition of moderate degree 05/12/2021   Atrial fibrillation with rapid ventricular response ([place]) 05/02/2021   SBO (small bowel obstruction) ([place]) 05/02/2021   Leukocytosis 05/02/2021   Hyperlipidemia 05/02/2021   Thoracic aortic aneurysm ([place])  05/02/2021   Incarcerated hernia     Past Surgical History:  Procedure Laterality Date   APPENDECTOMY     age 40   BOWEL RESECTION  05/09/2021   Procedure: SMALL BOWEL RESECTION;  Surgeon: Rolm Bookbinder, MD;  Location: [place];  Service: General;;   CATARACT EXTRACTION, BILATERAL     HERNIA REPAIR     RIH   INGUINAL HERNIA REPAIR Right 05/02/2021   Procedure: REPAIR  INCARCERATED INGUINAL HERNIA  WITH MESH;  Surgeon: Jesusita Oka, MD;  Location: [place];  Service: General;  Laterality: Right;   JOINT REPLACEMENT Right    RTHA   LAPAROSCOPY N/A 05/09/2021   Procedure: LAPAROSCOPY DIAGNOSTIC;  Surgeon: Rolm Bookbinder, MD;  Location: [place];  Service: General;  Laterality: N/A;   LAPAROTOMY  05/09/2021   Procedure: EXPLORATORY LAPAROTOMY;  Surgeon: Rolm Bookbinder, MD;  [facility];  Service: General;;   TONSILLECTOMY         Family History  Problem Relation Age of Onset   Heart disease Father    Heart attack Father    Heart attack Son    Sleep apnea Son     Social History   Tobacco Use   Smoking status: Former    Types: Cigarettes    Quit date: 10/24/1950    Years since quitting: 70.6   Smokeless tobacco: Never  Vaping Use   Vaping Use: Never used  Substance Use Topics   Alcohol use: No   Drug use: No    Home Medications Prior to Admission medications   Medication Sig Start Date End Date Taking? Authorizing Provider  apixaban (ELIQUIS) 5 MG TABS tablet Take 1 tablet (5 mg total) by mouth 2 (two) times daily. 06/03/21   Angiulli, Lavon Paganini, PA-C  diltiazem (CARDIZEM CD) 180 MG 24 hr capsule Take 1 capsule (180 mg total) by mouth daily. 06/04/21 09/02/21  Love, Ivan Anchors, PA-C  metroNIDAZOLE (FLAGYL) 500 MG tablet Take 1 tablet (500 mg total) by mouth every 12 (twelve) hours for 14 days. 06/03/21 06/17/21  Lovorn, Jinny Blossom, MD  Multiple Vitamin (MULTIVITAMIN WITH MINERALS) TABS tablet Take 1 tablet by mouth daily.    [provider]  Multiple Vitamins-Minerals  (PRESERVISION AREDS 2) CAPS Take 1 capsule 2 (two) times daily by mouth.    [provider]  pantoprazole (PROTONIX) 40 MG tablet Take 1 tablet (40 mg total) by mouth daily. 06/03/21   Angiulli, Lavon Paganini, PA-C  senna (SENOKOT) 8.6 MG TABS tablet Take 1 tablet (8.6 mg total) by mouth 2 (two) times daily. 06/03/21   Angiulli, Lavon Paganini, PA-C  sulfamethoxazole-trimethoprim (BACTRIM DS) 800-160 MG tablet Take 1 tablet by mouth every 12 (twelve) hours for 14 days. 06/03/21 06/17/21  Lovorn, Jinny Blossom, MD  VITAMIN E PO Take 1 capsule by mouth daily.     [provider]    Allergies    Patient has no known allergies.  Review of Systems   Review of Systems  Constitutional:  Positive for fatigue. Negative for chills and fever.  Eyes:  Negative for pain and visual disturbance.  Respiratory:  Negative for cough and shortness of breath.   Cardiovascular:  Negative for chest pain and palpitations.  Gastrointestinal:  Positive for constipation. Negative for abdominal pain, nausea and vomiting.  Genitourinary:  Negative for dysuria and hematuria.  Musculoskeletal:  Negative for arthralgias and back pain.  Skin:  Negative for color change and rash.  Neurological:  Negative for syncope and headaches.  All other systems reviewed and are negative.  Physical Exam Updated Vital Signs BP 123/75 (BP Location: Left Arm)   Pulse 65   Temp 99.6 F (37.6 C) (Oral)   Resp 16   SpO2 95%   Physical Exam Constitutional:      General: He is not in acute distress. HENT:     Head: Normocephalic and atraumatic.  Eyes:     Conjunctiva/sclera: Conjunctivae normal.     Pupils: Pupils are equal, round, and reactive to light.  Cardiovascular:     Rate and Rhythm: Normal rate and regular rhythm.  Pulmonary:     Effort: Pulmonary effort is normal. No respiratory distress.  Abdominal:     General: There is no distension.     Tenderness: There is no abdominal tenderness.     Comments: Abdominal surgical  site wound with pink granulation tissue, covered with clean bandages Soft left inguinal hernia palpable  Skin:    General: Skin is warm and dry.  Neurological:     General: No focal deficit present.     Mental Status: He is alert. Mental status is at baseline.  Psychiatric:        Mood and Affect: Mood normal.        Behavior: Behavior normal.    ED Results / Procedures / Treatments   Labs (all labs ordered are listed, but only abnormal results are displayed) Labs Reviewed  COMPREHENSIVE METABOLIC  PANEL - Abnormal; Notable for the following components:      Result Value   Sodium 132 (*)    CO2 19 (*)    Glucose, Bld 101 (*)    Albumin 3.1 (*)    All other components within normal limits  CBC WITH DIFFERENTIAL/PLATELET - Abnormal; Notable for the following components:   WBC 16.3 (*)    RBC 3.79 (*)    Hemoglobin 11.5 (*)    HCT 35.0 (*)    Neutro Abs 11.5 (*)    Monocytes Absolute 1.4 (*)    Abs Immature Granulocytes 0.08 (*)    All other components within normal limits  URINALYSIS, ROUTINE W REFLEX MICROSCOPIC - Abnormal; Notable for the following components:   Specific Gravity, Urine >1.030 (*)    Hgb urine dipstick TRACE (*)    Protein, ur 30 (*)    Leukocytes,Ua SMALL (*)    All other components within normal limits  URINALYSIS, MICROSCOPIC (REFLEX) - Abnormal; Notable for the following components:   Bacteria, UA RARE (*)    Non Squamous Epithelial PRESENT (*)    All other components within normal limits    EKG None  Radiology No results found.  Procedures Procedures   Medications Ordered in ED Medications - No data to display  ED Course  I have reviewed the triage vital signs and the nursing notes.  Pertinent labs & imaging results that were available during my care of the patient were reviewed by me and considered in my medical decision making (see chart for details).  Patient is here with concern for postoperative complication, difficulty with bowel  movements for 2 weeks.  No nausea or vomiting.  He does not have any obvious signs of high-grade obstruction.  No fever on arrival, he does have a leukocytosis of 16,000.  It is unclear what may be causing this.  UA shows small amount of leuks, no nitrites.  He was already on triple antibiotic therapy with Augmentin, Bactrim, and Flagyl, which appears to be empiric coverage for possible intra-abdominal infection.  I personally reviewed his prior medical work-up including recent hospitalization stay and postoperative course and discharge summary.  I personally reviewed his labs today.  I ordered a CT scan of the abdomen pelvis, as well as some IV fluids, given that he has a high specific gravity his urine, and likely has some hemoconcentration from lessened fluid intake.  Clinical Course as of 06/08/21 2238  Sun Jun 08, 2021  1618 K 4.0 [MT]  1818 Dr Tenny Craw consulted general surgery -recommending hospitalist admission given the patient has had episodes of paroxysmal A. fib, with his other medical comorbidities, particularly requiring heart rate control.  He will come by and see the patient.  It is possible this is an ileus or constipation related.  I placed a admission page to the hospitalist. [MT]  1819 Patient did briefly go into A. fib with RVR with a heart rate in the 130s, give him IV potassium with this.  He is asymptomatic from this perspective [MT]  1820 Admitted to the hospitalist [MT]    Clinical Course User Index [MT] Aminata Buffalo, Carola Rhine, MD    Final Clinical Impression(s) / ED Diagnoses Final diagnoses:  None    Rx / DC Orders ED Discharge Orders     None        Wyvonnia Dusky, MD 06/08/21 2239

## 2021-06-08 NOTE — Progress Notes (Signed)
Gary Rice is well known to our service. Hx afib (on Eliquis), SVT, HLD.  He was initially taken to the operating room by one of my partners, Dr. Bobbye Morton, 05/03/2021 and underwent open right inguinal hernia repair with mesh for a incarcerated direct right inguinal hernia containing bowel.  He was subsequently taken back to the operating room 05/09/2021 by Dr. Donne Hazel after having had a prolonged obstructive type pattern.  At that time, he underwent diagnostic laparoscopy with open small bowel resection.  There was an ischemic portion of small bowel with microperforation that was presumably the segment that was previously incarcerated.  He had a protracted recovery.  He ultimately developed a pelvic abscess and underwent placement of percutaneous drain by IR 05/13/2021.  During his hospitalization he developed findings concerning for acute cholecystitis.  He also had some dehiscence of his midline incision but no evisceration.  Ultimately, he was treated with IV antibiotics and improved.  He steadily recovered and was ultimately discharged to skilled nursing 06/05/2021.  Subjective He returns to the emergency department with rather vague complaints.  He reports that it has been a few days since he had a bowel movement and ever since his surgery, has only had small volume bowel movements.  He does not believe he is passed gas in the last couple of days.  He denies any nausea or vomiting.  He denies any abdominal distention or pain.  He denies any fever or chills.  Objective: Vital signs in last 24 hours: Temp:  [99.6 F (37.6 C)] 99.6 F (37.6 C) (09/11 1259) Pulse Rate:  [65-127] 98 (09/11 1815) Resp:  [12-16] 16 (09/11 1815) BP: (109-123)/(68-84) 115/79 (09/11 1815) SpO2:  [95 %-99 %] 98 % (09/11 1815) Weight:  [75.9 kg] 75.9 kg (09/11 1907)    Intake/Output from previous day: No intake/output data recorded. Intake/Output this shift: No intake/output data recorded.  Gen: NAD, comfortable CV:  RRR Pulm: Normal work of breathing Abd: Soft, NT/ND.  Midline wound without evisceration but apparent dehiscence.  This is covered in granulation tissue.  There is no evident fistula or erythema.  Right inguinal hernia repair site intact without erythema.  No palpable bulge.  Known left inguinal hernia is soft and freely reducible. Ext: SCDs in place  Lab Results: CBC  Recent Labs    06/08/21 1316  WBC 16.3*  HGB 11.5*  HCT 35.0*  PLT 304   BMET Recent Labs    06/08/21 1316  NA 132*  K 4.0  CL 104  CO2 19*  GLUCOSE 101*  BUN 21  CREATININE 1.08  CALCIUM 9.2   PT/INR No results for input(s): LABPROT, INR in the last 72 hours. ABG No results for input(s): PHART, HCO3 in the last 72 hours.  Invalid input(s): PCO2, PO2  Studies/Results:  Anti-infectives: Anti-infectives (From admission, onward)    Start     Dose/Rate Route Frequency Ordered Stop   06/08/21 1930  meropenem (MERREM) 1 g in sodium chloride 0.9 % 100 mL IVPB        1 g 200 mL/hr over 30 Minutes Intravenous Every 8 hours 06/08/21 1915          Assessment/Plan: Patient Active Problem List   Diagnosis Date Noted   Ileus ([place]) 06/08/2021   Cholecystitis, acute    Acute blood loss anemia    History of supraventricular tachycardia    Hyponatremia    Debility 05/26/2021   Pelvic abscess in male [facility]) 05/14/2021   Malnutrition of moderate degree  05/12/2021   Atrial fibrillation with rapid ventricular response ([place]) 05/02/2021   SBO (small bowel obstruction) ([place]) 05/02/2021   Leukocytosis 05/02/2021   Hyperlipidemia 05/02/2021   Thoracic aortic aneurysm ([place]) 05/02/2021   Incarcerated hernia    05/03/21 Open RIHR w/ mesh (Dr. Bobbye Morton) for incarcerated  05/09/21 - Dx lap/open SBR Dr. Donne Hazel 05/13/21 IR drainage pelvic abscess. Cx's w/ Klebsiella Oxytoca. Resolved, drain out  - BID WTD for midline wound  - Mobilize TID in halls, PT/OT   Acute cholecystitis - clinically has no pain; persistent  [facility] on CT - asymptomatic - no intervention needed at this juncture  Ileus May have constipation that is actually contributing to all of this vs reactive ileus related to fat necrosis in vicinity of his small bowel anastomosis. Miralax and time and this should improve.   FEN - Regular diet + Ensure; BID Miralax VTE - SCDs, ok for anticoagulation from our perspective ID - noted he was started on merrem, will certainly cover abdominal findings on CT abx to cover   Ascending thoracic aneurysm HLD   LOS: 0 days   Nadeen Landau, MD [facility] Use [email or web address] to contact on call provider

## 2021-06-08 NOTE — H&P (Signed)
TRH H&P    Patient Demographics:    Gary Rice, is a 87 y.o. male  MRN: [id number]  DOB - April 16, 1931  Admit Date - 06/08/2021  Referring MD/NP/PA: Dr. Langston Masker  Outpatient Primary MD for the patient is Josetta Huddle, MD  Patient coming from: Home  Chief complaint-constipation   HPI:    Gary Rice  is a 87 y.o. male, with medical history of SVT, hyperlipidemia, atrial fibrillation, small bowel obstruction, s/p intervention on 05/03/2021 with mesh placement.  This was complicated with bowel perforation, status post bowel resection with primary anastomosis.  Also developed intra-abdominal infection with CT scan showed large pelvic abscess, s/p drain placement by IR on 05/13/2021.  The abscess culture was growing Bacteroides and Klebsiella oxytoca, started on meropenem initially then switched to Bactrim and Flagyl for 2 weeks on 06/03/2021 as per ID recommendation. He was discharged from rehab on 06/05/2021. As per patient he has not had good bowel movement since the surgery, just had very small amount of stool every time he moved his bowels.  He denies fever or chills.  Denies abdominal pain.  No nausea or vomiting. CT abdomen/pelvis showed moderate stool burden, 2 cm focal inflammatory collection in the left abdominal mesentery near the anastomosis with central fat density likely evolving fat necrosis. General surgery has been consulted by ED provider. Patient denies chest pain or shortness of breath No headache or blurred vision Denies dysuria   Review of systems:    In addition to the HPI above,    All other systems reviewed and are negative.    Past History of the following :    Past Medical History:  Diagnosis Date   Astigmatism    Basal cell carcinoma    Inguinal hernia    bilateral   Macular degeneration, age related, nonexudative    bilateral   SVT (supraventricular tachycardia) ([place])       Past  Surgical History:  Procedure Laterality Date   APPENDECTOMY     age 89   BOWEL RESECTION  05/09/2021   Procedure: SMALL BOWEL RESECTION;  Surgeon: Rolm Bookbinder, MD;  Location: [facility];  Service: General;;   CATARACT EXTRACTION, BILATERAL     HERNIA REPAIR     RIH   INGUINAL HERNIA REPAIR Right 05/02/2021   Procedure: REPAIR  INCARCERATED INGUINAL HERNIA  WITH MESH;  Surgeon: Jesusita Oka, MD;  Location: [facility];  Service: General;  Laterality: Right;   JOINT REPLACEMENT Right    RTHA   LAPAROSCOPY N/A 05/09/2021   Procedure: LAPAROSCOPY DIAGNOSTIC;  Surgeon: Rolm Bookbinder, MD;  Location: [facility];  Service: General;  Laterality: N/A;   LAPAROTOMY  05/09/2021   Procedure: EXPLORATORY LAPAROTOMY;  Surgeon: Rolm Bookbinder, MD;  Location: [facility];  Service: General;;   TONSILLECTOMY        Social History:      Social History   Tobacco Use   Smoking status: Former    Types: Cigarettes    Quit date: 10/24/1950    Years since quitting: 70.6   Smokeless  tobacco: Never  Substance Use Topics   Alcohol use: No       Family History :     Family History  Problem Relation Age of Onset   Heart disease Father    Heart attack Father    Heart attack Son    Sleep apnea Son       Home Medications:   Prior to Admission medications   Medication Sig Start Date End Date Taking? Authorizing Provider  apixaban (ELIQUIS) 5 MG TABS tablet Take 1 tablet (5 mg total) by mouth 2 (two) times daily. 06/03/21   Angiulli, Lavon Paganini, PA-C  diltiazem (CARDIZEM CD) 180 MG 24 hr capsule Take 1 capsule (180 mg total) by mouth daily. 06/04/21 09/02/21  Love, Ivan Anchors, PA-C  metroNIDAZOLE (FLAGYL) 500 MG tablet Take 1 tablet (500 mg total) by mouth every 12 (twelve) hours for 14 days. 06/03/21 06/17/21  Lovorn, Jinny Blossom, MD  Multiple Vitamin (MULTIVITAMIN WITH MINERALS) TABS tablet Take 1 tablet by mouth daily.    [provider]  Multiple Vitamins-Minerals (PRESERVISION AREDS 2) CAPS Take 1 capsule  2 (two) times daily by mouth.    [provider]  pantoprazole (PROTONIX) 40 MG tablet Take 1 tablet (40 mg total) by mouth daily. 06/03/21   Angiulli, Lavon Paganini, PA-C  senna (SENOKOT) 8.6 MG TABS tablet Take 1 tablet (8.6 mg total) by mouth 2 (two) times daily. 06/03/21   Angiulli, Lavon Paganini, PA-C  sulfamethoxazole-trimethoprim (BACTRIM DS) 800-160 MG tablet Take 1 tablet by mouth every 12 (twelve) hours for 14 days. 06/03/21 06/17/21  Lovorn, Jinny Blossom, MD  VITAMIN E PO Take 1 capsule by mouth daily.     [provider]     Allergies:    No Known Allergies   Physical Exam:   Vitals  Blood pressure 115/79, pulse 98, temperature 99.6 F (37.6 C), temperature source Oral, resp. rate 16, SpO2 98 %.  1.  General: Appears in no acute distress  2. Psychiatric: Alert, oriented x3, intact insight and judgment  3. Neurologic: Cranial nerves II through XII grossly intact, no focal deficit noted  4. HEENMT:  Atraumatic normocephalic, extraocular muscles are intact  5. Respiratory : Clear to auscultation bilaterally  6. Cardiovascular : S1-S2, regular, no murmur auscultated  7. Gastrointestinal:  Abdomen is soft, nontender, no organomegaly  8. Skin:  No rashes noted  9.Musculoskeletal:  Trace edema in the lower extremities bilaterally    Data Review:    CBC Recent Labs  Lab 06/02/21 0412 06/08/21 1316  WBC 14.5* 16.3*  HGB 11.8* 11.5*  HCT 36.6* 35.0*  PLT 351 304  MCV 94.8 92.3  MCH 30.6 30.3  MCHC 32.2 32.9  RDW 14.0 14.0  LYMPHSABS  --  3.0  MONOABS  --  1.4*  EOSABS  --  0.3  BASOSABS  --  0.1   ------------------------------------------------------------------------------------------------------------------  Results for orders placed or performed during the hospital encounter of 06/08/21 (from the past 48 hour(s))  Comprehensive metabolic panel     Status: Abnormal   Collection Time: 06/08/21  1:16 PM  Result Value Ref Range   Sodium 132 (L) 135  - 145 mmol/L   Potassium 4.0 3.5 - 5.1 mmol/L   Chloride 104 98 - 111 mmol/L   CO2 19 (L) 22 - 32 mmol/L   Glucose, Bld 101 (H) 70 - 99 mg/dL    Comment: Glucose reference range applies only to samples taken after fasting for at least 8 hours.   BUN  21 8 - 23 mg/dL   Creatinine, Ser 1.08 0.61 - 1.24 mg/dL   Calcium 9.2 8.9 - 10.3 mg/dL   Total Protein 7.6 6.5 - 8.1 g/dL   Albumin 3.1 (L) 3.5 - 5.0 g/dL   AST 22 15 - 41 U/L   ALT 24 0 - 44 U/L   Alkaline Phosphatase 113 38 - 126 U/L   Total Bilirubin 0.8 0.3 - 1.2 mg/dL   GFR, Estimated >60 >60 mL/min    Comment: (NOTE) Calculated using the CKD-EPI Creatinine Equation (2021)    Anion gap 9 5 - 15    Comment: Performed at [facility] [address], [place], [place] [address]  CBC with Differential     Status: Abnormal   Collection Time: 06/08/21  1:16 PM  Result Value Ref Range   WBC 16.3 (H) 4.0 - 10.5 K/uL   RBC 3.79 (L) 4.22 - 5.81 MIL/uL   Hemoglobin 11.5 (L) 13.0 - 17.0 g/dL   HCT 35.0 (L) 39.0 - 52.0 %   MCV 92.3 80.0 - 100.0 fL   MCH 30.3 26.0 - 34.0 pg   MCHC 32.9 30.0 - 36.0 g/dL   RDW 14.0 11.5 - 15.5 %   Platelets 304 150 - 400 K/uL   nRBC 0.0 0.0 - 0.2 %   Neutrophils Relative % 69 %   Neutro Abs 11.5 (H) 1.7 - 7.7 K/uL   Lymphocytes Relative 18 %   Lymphs Abs 3.0 0.7 - 4.0 K/uL   Monocytes Relative 9 %   Monocytes Absolute 1.4 (H) 0.1 - 1.0 K/uL   Eosinophils Relative 2 %   Eosinophils Absolute 0.3 0.0 - 0.5 K/uL   Basophils Relative 1 %   Basophils Absolute 0.1 0.0 - 0.1 K/uL   Immature Granulocytes 1 %   Abs Immature Granulocytes 0.08 (H) 0.00 - 0.07 K/uL    Comment: Performed at [facility] [address] [place], [place] [address]  Urinalysis, Routine w reflex microscopic     Status: Abnormal   Collection Time: 06/08/21  1:16 PM  Result Value Ref Range   Color, Urine YELLOW YELLOW   APPearance CLEAR CLEAR   Specific Gravity, Urine >1.030 (H) 1.005 - 1.030   pH 6.0 5.0 - 8.0    Glucose, UA NEGATIVE NEGATIVE mg/dL   Hgb urine dipstick TRACE (A) NEGATIVE   Bilirubin Urine NEGATIVE NEGATIVE   Ketones, ur NEGATIVE NEGATIVE mg/dL   Protein, ur 30 (A) NEGATIVE mg/dL   Nitrite NEGATIVE NEGATIVE   Leukocytes,Ua SMALL (A) NEGATIVE    Comment: Performed at [facility] [address] [place], [place] [address]  Urinalysis, Microscopic (reflex)     Status: Abnormal   Collection Time: 06/08/21  1:16 PM  Result Value Ref Range   RBC / HPF >50 0 - 5 RBC/hpf   WBC, UA >50 0 - 5 WBC/hpf   Bacteria, UA RARE (A) NONE SEEN   Squamous Epithelial / LPF 0-5 0 - 5   Non Squamous Epithelial PRESENT (A) NONE SEEN   Mucus PRESENT    Ca Oxalate Crys, UA PRESENT     Comment: Performed at [facility] [address], [place], [place] [address]  Resp Panel by RT-PCR (Flu A&B, Covid) Nasopharyngeal Swab     Status: None   Collection Time: 06/08/21  5:08 PM   Specimen: Nasopharyngeal Swab; Nasopharyngeal(NP) swabs in vial transport medium  Result Value Ref Range   SARS Coronavirus 2  by RT PCR NEGATIVE NEGATIVE    Comment: (NOTE) SARS-CoV-2 target nucleic acids are NOT DETECTED.  The SARS-CoV-2 RNA is generally detectable in upper respiratory specimens during the acute phase of infection. The lowest concentration of SARS-CoV-2 viral copies this assay can detect is 138 copies/mL. A negative result does not preclude SARS-Cov-2 infection and should not be used as the sole basis for treatment or other patient management decisions. A negative result may occur with  improper specimen collection/handling, submission of specimen other than nasopharyngeal swab, presence of viral mutation(s) within the areas targeted by this assay, and inadequate number of viral copies(<138 copies/mL). A negative result must be combined with clinical observations, patient history, and epidemiological information. The expected result is Negative.  Fact Sheet for Patients:   EntrepreneurPulse.com.au  Fact Sheet for Healthcare Providers:  IncredibleEmployment.be  This test is no t yet approved or cleared by the [place] FDA and  has been authorized for detection and/or diagnosis of SARS-CoV-2 by FDA under an Emergency Use Authorization (EUA). This EUA will remain  in effect (meaning this test can be used) for the duration of the COVID-19 declaration under Section 564(b)(1) of the Act, 21 U.S.C.section 360bbb-3(b)(1), unless the authorization is terminated  or revoked sooner.       Influenza A by PCR NEGATIVE NEGATIVE   Influenza B by PCR NEGATIVE NEGATIVE    Comment: (NOTE) The Xpert Xpress SARS-CoV-2/FLU/RSV plus assay is intended as an aid in the diagnosis of influenza from Nasopharyngeal swab specimens and should not be used as a sole basis for treatment. Nasal washings and aspirates are unacceptable for Xpert Xpress SARS-CoV-2/FLU/RSV testing.  Fact Sheet for Patients: EntrepreneurPulse.com.au  Fact Sheet for Healthcare Providers: IncredibleEmployment.be  This test is not yet approved or cleared by the [place] FDA and has been authorized for detection and/or diagnosis of SARS-CoV-2 by FDA under an Emergency Use Authorization (EUA). This EUA will remain in effect (meaning this test can be used) for the duration of the COVID-19 declaration under Section 564(b)(1) of the Act, 21 U.S.C. section 360bbb-3(b)(1), unless the authorization is terminated or revoked.  Performed at [facility], [place] [address], [place], [place] [address]     Chemistries  Recent Labs  Lab 06/02/21 [phone] 06/08/21 1316  NA 135 132*  K 4.1 4.0  CL 97* 104  CO2 27 19*  GLUCOSE 102* 101*  BUN 21 21  CREATININE 0.74 1.08  CALCIUM 9.5 9.2  AST 17 22  ALT 30 24  ALKPHOS 139* 113  BILITOT 0.7 0.8    ------------------------------------------------------------------------------------------------------------------  ------------------------------------------------------------------------------------------------------------------ GFR: Estimated Creatinine Clearance: 50.4 mL/min (by C-G formula based on SCr of 1.08 mg/dL). Liver Function Tests: Recent Labs  Lab 06/02/21 0412 06/08/21 1316  AST 17 22  ALT 30 24  ALKPHOS 139* 113  BILITOT 0.7 0.8  PROT 6.7 7.6  ALBUMIN 2.5* 3.1*    --------------------------------------------------------------------------------------------------------------- Urine analysis:    Component Value Date/Time   COLORURINE YELLOW 06/08/2021 1316   APPEARANCEUR CLEAR 06/08/2021 1316   LABSPEC >1.030 (H) 06/08/2021 1316   PHURINE 6.0 06/08/2021 1316   GLUCOSEU NEGATIVE 06/08/2021 1316   HGBUR TRACE (A) 06/08/2021 1316   BILIRUBINUR NEGATIVE 06/08/2021 1316   KETONESUR NEGATIVE 06/08/2021 1316   PROTEINUR 30 (A) 06/08/2021 1316   NITRITE NEGATIVE 06/08/2021 1316   LEUKOCYTESUR SMALL (A) 06/08/2021 1316      Imaging Results:    CT ABDOMEN PELVIS W CONTRAST  Result Date: 06/08/2021 CLINICAL DATA:  History of  recent bowel obstruction, hernia decreased bowel output EXAM: CT ABDOMEN AND PELVIS WITH CONTRAST TECHNIQUE: Multidetector CT imaging of the abdomen and pelvis was performed using the standard protocol following bolus administration of intravenous contrast. CONTRAST:  66m OMNIPAQUE IOHEXOL 350 MG/ML SOLN COMPARISON:  CT 06/02/2021, 05/19/2021, 05/02/2021, 05/05/2021 FINDINGS: Lower chest: Lung bases demonstrate no acute consolidation or effusion. Normal cardiac size. Coronary vascular calcification. Hepatobiliary: Low-density hepatic lesions probably cysts. Hyperdense stones or sludge in the gallbladder with wall thickening versus pericholecystic fluid. Stable prominent extrahepatic common bile duct measuring up to 8-9 mm. Pancreas: Unremarkable. No  pancreatic ductal dilatation or surrounding inflammatory changes. Spleen: Normal in size without focal abnormality. Adrenals/Urinary Tract: Adrenal glands are normal. 9.8 cm left renal cyst. Other cysts in the kidneys. No hydronephrosis. The bladder is partially obscured by artifact. Stomach/Bowel: Stomach nonenlarged. Focal fluid-filled dilated segment of small bowel left mid abdomen with surgical changes, unchanged. No convincing evidence for small bowel obstruction. No acute bowel wall thickening. Vascular/Lymphatic: Moderate aortic atherosclerosis. No aneurysm. No suspicious nodes Reproductive: Prostate is enlarged with calcification Other: Ventral wound. No free air or free fluid. Moderate to large left inguinal hernia containing fat and small bowel but no obstructive changes. Status post right inguinal hernia repair with small amount of fat in the upper inguinal canal. Small residual inflammatory focus containing fat measuring 2 cm in the left lower quadrant mesentery, series 3, image 48 likely fat necrosis. Removal of previously noted right transgluteal percutaneous drainage catheter with minimal soft tissue thickening between the posterior bladder and rectum but no residual fluid collection. Musculoskeletal: Artifact from right hip arthroplasty. Degenerative changes of the lumbar spine without acute osseous abnormality. IMPRESSION: 1. Interim removal of right transgluteal percutaneous drainage catheter. Residual soft tissue thickening at the site of prior catheter but no measurable fluid collection. 2. Status post left abdominal bowel resection with anastomosis; similar mild fluid distension of small bowel at the anastomosis but no convincing evidence for a bowel obstruction at this time. Moderate stool burden. No acute bowel wall thickening. 2 cm focal inflammatory collection in the left abdominal mesentery near the anastomosis with central fat density likely evolving fat necrosis. 3. Hyperdense material in  the gallbladder with persistent wall thickening and or pericholecystic fluid 4. Left inguinal hernia containing fat and small bowel but no evidence for an obstruction. Electronically Signed   By: KDonavan FoilM.D.   On: 06/08/2021 17:46    My personal review of EKG: Rhythm atrial fibrillation with RVR   Assessment & Plan:    Active Problems:   Ileus (HCC)   Constipation/ileus-CT abdomen/pelvis shows moderate stool burden with 2 cm focal inflammatory collection in the left abdominal mesentery near the anastomosis with central fat density likely evolving fat necrosis.  We will keep patient n.p.o., general surgery has been consulted. Paroxysmal atrial fibrillation-patient has history of A. fib and is currently on anticoagulation with Eliquis along with Cardizem CD1 80 mg daily. CHA2DS2VASc score is 2.  We will start patient on heparin per pharmacy, hold Eliquis as patient is currently NPO.  Patient received Cardizem 10 mg IV in the ED.  Heart rate is controlled right now.  Blood pressure is soft.  We will start Cardizem gtt. if patient goes into A. fib with RVR. Intra-abdominal abscess-intra-abdominal abscess grew Klebsiella oxytoca and Bacteroides species and he was started on meropenem initially till 06/02/2021.  From 06/04/2019 to he was started on p.o. Flagyl and Bactrim.  At this time patient is n.p.o.  Will  restart meropenem as he was supposed to get till till 06/17/2021 as per ID recommendation.    DVT Prophylaxis-   Heparin  AM Labs Ordered, also please review Full Orders  Family Communication: Admission, patients condition and plan of care including tests being ordered have been discussed with the patient who indicate understanding and agree with the plan and Code Status.  Code Status: Full code  Admission status: Inpatient :The appropriate admission status for this patient is INPATIENT. Inpatient status is judged to be reasonable and necessary in order to provide the required intensity of  service to ensure the patient's safety. The patient's presenting symptoms, physical exam findings, and initial radiographic and laboratory data in the context of their chronic comorbidities is felt to place them at high risk for further clinical deterioration. Furthermore, it is not anticipated that the patient will be medically stable for discharge from the hospital within 2 midnights of admission. The following factors support the admission status of inpatient.     The patient's presenting symptoms include constipation. The worrisome physical exam findings include ileus. The initial radiographic and laboratory data are worrisome because of ileus. The chronic co-morbidities include paroxysmal atrial fibrillation.       * I certify that at the point of admission it is my clinical judgment that the patient will require inpatient hospital care spanning beyond 2 midnights from the point of admission due to high intensity of service, high risk for further deterioration and high frequency of surveillance required.*  Time spent in minutes : 60 minutes   Yaslyn Cumby S Emiliana Blaize M.D

## 2021-06-09 LAB — COMPREHENSIVE METABOLIC PANEL
ALT: 20 U/L (ref 0–44)
AST: 19 U/L (ref 15–41)
Albumin: 2.5 g/dL — ABNORMAL LOW (ref 3.5–5.0)
Alkaline Phosphatase: 92 U/L (ref 38–126)
Anion gap: 8 (ref 5–15)
BUN: 11 mg/dL (ref 8–23)
CO2: 21 mmol/L — ABNORMAL LOW (ref 22–32)
Calcium: 8.4 mg/dL — ABNORMAL LOW (ref 8.9–10.3)
Chloride: 106 mmol/L (ref 98–111)
Creatinine, Ser: 0.82 mg/dL (ref 0.61–1.24)
GFR, Estimated: >60 mL/min (ref >60)
Glucose, Bld: 76 mg/dL (ref 70–99)
Potassium: 3.6 mmol/L (ref 3.5–5.1)
Sodium: 135 mmol/L (ref 135–145)
Total Bilirubin: 1.3 mg/dL — ABNORMAL HIGH (ref 0.3–1.2)
Total Protein: 6.2 g/dL — ABNORMAL LOW (ref 6.5–8.1)

## 2021-06-09 LAB — CBC
HCT: 33 % — ABNORMAL LOW (ref 39.0–52.0)
Hemoglobin: 11 g/dL — ABNORMAL LOW (ref 13.0–17.0)
MCH: 31.1 pg (ref 26.0–34.0)
MCHC: 33.3 g/dL (ref 30.0–36.0)
MCV: 93.2 fL (ref 80.0–100.0)
Platelets: 255 10*3/uL (ref 150–400)
RBC: 3.54 MIL/uL — ABNORMAL LOW (ref 4.22–5.81)
RDW: 14.2 % (ref 11.5–15.5)
WBC: 12 10*3/uL — ABNORMAL HIGH (ref 4.0–10.5)
nRBC: 0 % (ref 0.0–0.2)

## 2021-06-09 LAB — APTT
aPTT: >200 seconds (ref 24–36)
aPTT: 57 seconds — ABNORMAL HIGH (ref 24–36)
aPTT: 37 seconds — ABNORMAL HIGH (ref 24–36)

## 2021-06-09 LAB — HEPARIN LEVEL (UNFRACTIONATED)
Heparin Unfractionated: >1.1 IU/mL — ABNORMAL HIGH (ref 0.30–0.70)
Heparin Unfractionated: 0.79 IU/mL — ABNORMAL HIGH (ref 0.30–0.70)

## 2021-06-09 MED ORDER — METRONIDAZOLE 500 MG PO TABS
500.0000 mg | ORAL_TABLET | Freq: Three times a day (TID) | ORAL | Status: DC
  Administered 2021-06-09 – 2021-06-10 (×2): 500 mg via ORAL
  Filled 2021-06-09 (×4): qty 1

## 2021-06-09 MED ORDER — SULFAMETHOXAZOLE-TRIMETHOPRIM 800-160 MG PO TABS
1.0000 | ORAL_TABLET | Freq: Two times a day (BID) | ORAL | Status: DC
  Administered 2021-06-09 – 2021-06-10 (×2): 1 via ORAL
  Filled 2021-06-09 (×3): qty 1

## 2021-06-09 MED ORDER — BISACODYL 10 MG RE SUPP: 10.0000 mg | Freq: Every day | RECTAL | Status: DC | PRN

## 2021-06-09 MED ORDER — POLYETHYLENE GLYCOL 3350 17 G PO PACK
17.0000 g | PACK | Freq: Two times a day (BID) | ORAL | Status: DC
  Administered 2021-06-09 – 2021-06-10 (×2): 17 g via ORAL
  Filled 2021-06-09 (×3): qty 1

## 2021-06-09 MED ORDER — DOCUSATE SODIUM 100 MG PO CAPS
100.0000 mg | ORAL_CAPSULE | Freq: Two times a day (BID) | ORAL | Status: DC
  Administered 2021-06-09 – 2021-06-10 (×2): 100 mg via ORAL
  Filled 2021-06-09 (×2): qty 1

## 2021-06-09 MED ORDER — APIXABAN 5 MG PO TABS
5.0000 mg | ORAL_TABLET | Freq: Two times a day (BID) | ORAL | Status: DC
  Administered 2021-06-09 – 2021-06-10 (×2): 5 mg via ORAL
  Filled 2021-06-09 (×3): qty 1

## 2021-06-09 MED ORDER — DOCUSATE SODIUM 100 MG PO CAPS
100.0000 mg | ORAL_CAPSULE | Freq: Two times a day (BID) | ORAL | Status: DC | PRN
Start: 2021-06-09 — End: 2021-06-09

## 2021-06-09 NOTE — Progress Notes (Signed)
Subjective: CC: Patient reports that he began passing flatus this am and then had a large bm. All of his symptoms have now resolved. He reports no abdominal pain, n/v. He is hungry.   Objective: Vital signs in last 24 hours: Temp:  [98.5 F (36.9 C)] 98.5 F (36.9 C) (09/12 0739) Pulse Rate:  [35-127] 87 (09/12 1045) Resp:  [12-25] 16 (09/12 1045) BP: (101-126)/(49-84) 113/49 (09/12 1045) SpO2:  [90 %-100 %] 94 % (09/12 1045) Weight:  [75.9 kg] 75.9 kg (09/11 1907)    Intake/Output from previous day: 09/11 0701 - 09/12 0700 In: 1100 [IV Piggyback:1100] Out: -  Intake/Output this shift: Total I/O In: 100 [IV Piggyback:100] Out: -   PE: Gen:  Alert, NAD, pleasant Card: Reg rate, irregular rhythm Pulm:  Normal rate and effort  Abd: Soft, ND, NT +BS, midline wound without evisceration but noted dehiscence. There is healthy granulation tissue at the base. Right inguinal hernia repair site intact and without evidence of infection. L inguinal hernia chronic and well known - this is soft and freely reducible  Psych: A&Ox3  Skin: no rashes noted, warm and dry  Lab Results:  Recent Labs    06/08/21 1316 06/09/21 0722  WBC 16.3* 12.0*  HGB 11.5* 11.0*  HCT 35.0* 33.0*  PLT 304 255   BMET Recent Labs    06/08/21 1316 06/09/21 0722  NA 132* 135  K 4.0 3.6  CL 104 106  CO2 19* 21*  GLUCOSE 101* 76  BUN 21 11  CREATININE 1.08 0.82  CALCIUM 9.2 8.4*   PT/INR Recent Labs    06/08/21 1946  LABPROT 15.0  INR 1.2   CMP     Component Value Date/Time   NA 135 06/09/2021 0722   K 3.6 06/09/2021 0722   CL 106 06/09/2021 0722   CO2 21 (L) 06/09/2021 0722   GLUCOSE 76 06/09/2021 0722   BUN 11 06/09/2021 0722   CREATININE 0.82 06/09/2021 0722   CALCIUM 8.4 (L) 06/09/2021 0722   PROT 6.2 (L) 06/09/2021 0722   ALBUMIN 2.5 (L) 06/09/2021 0722   AST 19 06/09/2021 0722   ALT 20 06/09/2021 0722   ALKPHOS 92 06/09/2021 0722   BILITOT 1.3 (H) 06/09/2021 0722    GFRNONAA >60 06/09/2021 0722   GFRAA  08/14/2007 0312    >60        The eGFR has been calculated using the MDRD equation. This calculation has not been validated in all clinical   Lipase     Component Value Date/Time   LIPASE 27 05/02/2021 1806    Studies/Results: CT ABDOMEN PELVIS W CONTRAST  Result Date: 06/08/2021 CLINICAL DATA:  History of recent bowel obstruction, hernia decreased bowel output EXAM: CT ABDOMEN AND PELVIS WITH CONTRAST TECHNIQUE: Multidetector CT imaging of the abdomen and pelvis was performed using the standard protocol following bolus administration of intravenous contrast. CONTRAST:  71m OMNIPAQUE IOHEXOL 350 MG/ML SOLN COMPARISON:  CT 06/02/2021, 05/19/2021, 05/02/2021, 05/05/2021 FINDINGS: Lower chest: Lung bases demonstrate no acute consolidation or effusion. Normal cardiac size. Coronary vascular calcification. Hepatobiliary: Low-density hepatic lesions probably cysts. Hyperdense stones or sludge in the gallbladder with wall thickening versus pericholecystic fluid. Stable prominent extrahepatic common bile duct measuring up to 8-9 mm. Pancreas: Unremarkable. No pancreatic ductal dilatation or surrounding inflammatory changes. Spleen: Normal in size without focal abnormality. Adrenals/Urinary Tract: Adrenal glands are normal. 9.8 cm left renal cyst. Other cysts in the kidneys. No hydronephrosis. The bladder is partially  obscured by artifact. Stomach/Bowel: Stomach nonenlarged. Focal fluid-filled dilated segment of small bowel left mid abdomen with surgical changes, unchanged. No convincing evidence for small bowel obstruction. No acute bowel wall thickening. Vascular/Lymphatic: Moderate aortic atherosclerosis. No aneurysm. No suspicious nodes Reproductive: Prostate is enlarged with calcification Other: Ventral wound. No free air or free fluid. Moderate to large left inguinal hernia containing fat and small bowel but no obstructive changes. Status post right inguinal  hernia repair with small amount of fat in the upper inguinal canal. Small residual inflammatory focus containing fat measuring 2 cm in the left lower quadrant mesentery, series 3, image 48 likely fat necrosis. Removal of previously noted right transgluteal percutaneous drainage catheter with minimal soft tissue thickening between the posterior bladder and rectum but no residual fluid collection. Musculoskeletal: Artifact from right hip arthroplasty. Degenerative changes of the lumbar spine without acute osseous abnormality. IMPRESSION: 1. Interim removal of right transgluteal percutaneous drainage catheter. Residual soft tissue thickening at the site of prior catheter but no measurable fluid collection. 2. Status post left abdominal bowel resection with anastomosis; similar mild fluid distension of small bowel at the anastomosis but no convincing evidence for a bowel obstruction at this time. Moderate stool burden. No acute bowel wall thickening. 2 cm focal inflammatory collection in the left abdominal mesentery near the anastomosis with central fat density likely evolving fat necrosis. 3. Hyperdense material in the gallbladder with persistent wall thickening and or pericholecystic fluid 4. Left inguinal hernia containing fat and small bowel but no evidence for an obstruction. Electronically Signed   By: Donavan Foil M.D.   On: 06/08/2021 17:46    Anti-infectives: Anti-infectives (From admission, onward)    Start     Dose/Rate Route Frequency Ordered Stop   06/08/21 1930  meropenem (MERREM) 1 g in sodium chloride 0.9 % 100 mL IVPB        1 g 200 mL/hr over 30 Minutes Intravenous Every 8 hours 06/08/21 1915          Assessment/Plan 05/03/21 Open RIHR w/ mesh (Dr. Bobbye Morton) for incarcerated  05/09/21 - Dx lap/open SBR Dr. Donne Hazel 05/13/21 IR drainage pelvic abscess. Cx's w/ Klebsiella Oxytoca. Resolved, drain out - No residual fluid collection on CT 9/11 - BID WTD for midline wound  - Mobilize TID in  halls, PT/OT   Acute cholecystitis - clinically has no pain; persistent PCCF on CT - asymptomatic - T. Bili 1.3, LFT's otherwise wnl. Trend - no intervention needed at this juncture   Ileus May have constipation that is actually contributing to all of this vs reactive ileus related to fat necrosis in vicinity of his small bowel anastomosis. This appears improved after bowel regimen. Will start on reg diet and continue bowel regimen.    FEN - Regular diet + Ensure; BID Miralax, PRN colace and suppository  VTE - SCDs, ok for anticoagulation from our perspective ID - Currently on merrem, this will certainly cover abdominal findings on CT    Ascending thoracic aneurysm HLD   LOS: 1 day    Jillyn Ledger , [facility] Surgery 06/09/2021, 3:13 PM Please see Amion for pager number during day hours 7:00am-4:30pm

## 2021-06-09 NOTE — Progress Notes (Addendum)
ANTICOAGULATION CONSULT NOTE - Follow Up Consult  Pharmacy Consult for heparin Indication: atrial fibrillation  No Known Allergies  Patient Measurements: Height: '5\' 11"'$  (180.3 cm) Weight: 75.9 kg (167 lb 5.3 oz) IBW/kg (Calculated) : 75.3 Heparin Dosing Weight: 75.9 kg  Vital Signs: Temp: 98.5 F (36.9 C) (09/12 0739) Temp Source: Oral (09/12 0739) BP: 110/72 (09/12 1014) Pulse Rate: 79 (09/12 1014)  Labs: Recent Labs    06/08/21 1316 06/08/21 1946 06/09/21 0722 06/09/21 0901  HGB 11.5*  --  11.0*  --   HCT 35.0*  --  33.0*  --   PLT 304  --  255  --   APTT  --  30 >200* 57*  LABPROT  --  15.0  --   --   INR  --  1.2  --   --   HEPARINUNFRC  --  >1.10* >1.10* 0.79*  CREATININE 1.08  --  0.82  --    Medications: see MAR  Assessment: 87 yo M with a history of SVT, hyperlipidemia, atrial fibrillation, small bowel obstruction, s/p intervention on 05/03/2021 with mesh placement. Heparin per pharmacy consult placed for atrial fibrillation.   Patient is on apixaban prior to arrival. Last dose 9/11 @ 2100. Heparin level >1.10 and aPTT >200 secs this AM for first check, spoke to RN and found that blood was drawn above heparin IV site without heparin being paused so both levels falsely elevated. Both labs re-drawn by phlebotomy, and aPTT close to goal but subtherapeutic still at 57 secs and HL 0.74.  Goal of Therapy:  Heparin level 0.3-0.7 units/ml Heparin level 66-102 units/ml Monitor platelets by anticoagulation protocol: Yes   Plan:  Increase heparin infusion to 1200 units/hr (~2.7 units/kg/hr increase from prior dose) F/u 8h aPTT (since on PTA DOAC) Monitor daily HL and APTT Monitor daily CBC and s/sx of any bleeding F/u transition back to [facility] and surg plan for remaining Wallace, PharmD, [facility] Emergency Medicine Clinical Pharmacist ED RPh Phone: [place]: [phone]

## 2021-06-09 NOTE — Progress Notes (Signed)
[facility] Progress Note  Patient: Gary Rice [facility]    [id number]  DOA: 06/08/2021     Date of Service: the patient was seen and examined on 06/09/2021  Brief hospital course: Past medical history of SVT, hyperlipidemia, PAF, SBO, mesh placement in 822.  Presents with complaints of abdominal pain.  Found to have severe constipation possible ileus.  Currently symptoms resolved. Currently plan is monitor overnight for stabilization and tolerance of diet.  Subjective: No nausea no vomiting the time of my evaluation.  No abdominal pain as well.  Had a bowel bowel movement.  No bleeding.  Assessment and Plan: 1.  Constipation CT scan of the abdomen shows evidence of moderate stool burden. Had a bowel movement. Now advancing diet. Continue stool softener.  2.  Recent SBO with intra-abdominal abscess. Was on Bactrim outpatient. Currently on meropenem. Regimen back to Bactrim Flagyl again. Monitor.  3.  PAF Rate controlled controlled. Continue to monitor pretension patient was on Eliquis at home transition to heparin.  Given that no surgery is planned will transition back to Eliquis.  Scheduled Meds:  apixaban  5 mg Oral BID   docusate sodium  100 mg Oral BID   metroNIDAZOLE  500 mg Oral Q8H   polyethylene glycol  17 g Oral BID   sulfamethoxazole-trimethoprim  1 tablet Oral Q12H   Continuous Infusions:  lactated ringers 100 mL/hr at 06/09/21 0754   PRN Meds: acetaminophen **OR** acetaminophen, bisacodyl, ondansetron **OR** ondansetron (ZOFRAN) IV  Body mass index is 21.86 kg/m.        DVT Prophylaxis:    apixaban (ELIQUIS) tablet 5 mg    Advance goals of care discussion: Pt is Full code.  Family Communication: no family was present at bedside, at the time of interview.   Data Reviewed: I have personally reviewed and interpreted daily labs, tele strips, imaging. Electrolytes stable.  Serum creatinine stable.  LFTs stable.  WBC mildly elevated.  Hemoglobin  stable.  Physical Exam:  General: Appear in mild distress, no Rash; Oral Mucosa Clear, moist. no Abnormal Neck Mass Or lumps, Conjunctiva normal  Cardiovascular: S1 and S2 Present, no Murmur, Respiratory: good respiratory effort, Bilateral Air entry present and CTA, no Crackles, no wheezes Abdomen: Bowel Sound present, Soft and no tenderness Extremities: no Pedal edema Neurology: alert and oriented to time, place, and person affect appropriate. no new focal deficit Gait not checked due to patient safety concerns   Vitals:   06/09/21 1615 06/09/21 1735 06/09/21 1826 06/09/21 2010  BP: 115/66 113/70 131/76 96/65  Pulse: 82 91 83 97  Resp: 19 18 (!) 24 20  Temp:  98.8 F (37.1 C) 97.7 F (36.5 C) 97.8 F (36.6 C)  TempSrc:  Oral Oral Oral  SpO2: 95% 100% 95% 92%  Weight:   71.1 kg   Height:   '5\' 11"'$  (1.803 m)     Disposition:  Status is: Inpatient  Remains inpatient appropriate because:IV treatments appropriate due to intensity of illness or inability to take PO  Dispo: The patient is from: Home              Anticipated d/c is to: Home              Patient currently is not medically stable to d/c.   Difficult to place patient No  Time spent: 35 minutes. I reviewed all nursing notes, pharmacy notes, vitals, pertinent old records. I have discussed plan of care as described above with RN.  Author: Berle Mull,  MD [facility] 06/09/2021 10:01 PM  To reach On-call, see care teams to locate the attending and reach out via [email or web address]. Between 7PM-7AM, please contact night-coverage If you still have difficulty reaching the attending provider, please page the [facility] (Director on Call) for [facility] on amion for assistance.

## 2021-06-09 NOTE — ED Notes (Signed)
This EMT assisted the patient to the restroom. After the patient was finished and called for assistance, this EMT observed that the patient had a large bowel movement that was softer in consistency. The patient states that he feels great relief after the bowel movement.

## 2021-06-10 LAB — COMPREHENSIVE METABOLIC PANEL
ALT: 21 U/L (ref 0–44)
AST: 21 U/L (ref 15–41)
Albumin: 2.6 g/dL — ABNORMAL LOW (ref 3.5–5.0)
Alkaline Phosphatase: 93 U/L (ref 38–126)
Anion gap: 8 (ref 5–15)
BUN: 12 mg/dL (ref 8–23)
CO2: 24 mmol/L (ref 22–32)
Calcium: 9 mg/dL (ref 8.9–10.3)
Chloride: 101 mmol/L (ref 98–111)
Creatinine, Ser: 0.83 mg/dL (ref 0.61–1.24)
GFR, Estimated: >60 mL/min (ref >60)
Glucose, Bld: 138 mg/dL — ABNORMAL HIGH (ref 70–99)
Potassium: 4.2 mmol/L (ref 3.5–5.1)
Sodium: 133 mmol/L — ABNORMAL LOW (ref 135–145)
Total Bilirubin: 1.1 mg/dL (ref 0.3–1.2)
Total Protein: 6.8 g/dL (ref 6.5–8.1)

## 2021-06-10 LAB — CBC
HCT: 34.2 % — ABNORMAL LOW (ref 39.0–52.0)
Hemoglobin: 11.6 g/dL — ABNORMAL LOW (ref 13.0–17.0)
MCH: 30.6 pg (ref 26.0–34.0)
MCHC: 33.9 g/dL (ref 30.0–36.0)
MCV: 90.2 fL (ref 80.0–100.0)
Platelets: 330 10*3/uL (ref 150–400)
RBC: 3.79 MIL/uL — ABNORMAL LOW (ref 4.22–5.81)
RDW: 14.1 % (ref 11.5–15.5)
WBC: 9.8 10*3/uL (ref 4.0–10.5)
nRBC: 0 % (ref 0.0–0.2)

## 2021-06-10 MED ORDER — DOCUSATE SODIUM 100 MG PO CAPS: 100.0000 mg | ORAL_CAPSULE | Freq: Two times a day (BID) | ORAL | 0 refills | Status: DC

## 2021-06-10 MED ORDER — POLYETHYLENE GLYCOL 3350 17 G PO PACK: 17.0000 g | PACK | Freq: Every day | ORAL | 0 refills | Status: DC

## 2021-06-10 NOTE — Consult Note (Signed)
   [facility] CM Inpatient Consult   06/10/2021  Blayne Duba [facility] [birth year] [id number]  [employer] [ACO] Patient: Medicare CMS DCE  Primary Care Provider:  Josetta Huddle, MD [facility]  Patient screened for  less than 30 days readmission hospitalization and reviewed to  assess for potential [place] [facility] service needs for post hospital transition.  Review of patient's medical record reveals patient is for home s/p ileus.   Plan:  No current care management needs assessed at this time.  For questions contact:   Natividad Brood, RN BSN [facility] Liaison  [phone] business mobile phone Toll free office [phone]  Fax number: [phone] [email or web address]

## 2021-06-10 NOTE — Progress Notes (Signed)
Subjective: CC: Doing well. No abdominal pain, n/v. Tolerating regular diet. Passing flatus. Had another bm after I saw him yesterday.   Objective: Vital signs in last 24 hours: Temp:  [97.7 F (36.5 C)-98.8 F (37.1 C)] 98.7 F (37.1 C) (09/13 0800) Pulse Rate:  [48-138] 94 (09/13 0800) Resp:  [11-24] 19 (09/13 0800) BP: (96-131)/(49-87) 96/64 (09/13 0800) SpO2:  [91 %-100 %] 91 % (09/13 0800) Weight:  [71.1 kg] 71.1 kg (09/12 1826) Last BM Date: 06/09/21  Intake/Output from previous day: 09/12 0701 - 09/13 0700 In: 2310 [I.V.:2210; IV Piggyback:100] Out: 301 [Urine:300; Stool:1] Intake/Output this shift: No intake/output data recorded.  PE: Gen:  Alert, NAD, pleasant Card: Reg rate, irregular rhythm Pulm:  Normal rate and effort  Abd: Soft, ND, NT +BS, midline wound without evisceration but noted dehiscence. There is healthy granulation tissue at the base. Right inguinal hernia repair site intact and without evidence of infection. L inguinal hernia chronic and well known - this is soft and freely reducible  Psych: A&Ox3  Skin: no rashes noted, warm and dry  Lab Results:  Recent Labs    06/08/21 1316 06/09/21 0722  WBC 16.3* 12.0*  HGB 11.5* 11.0*  HCT 35.0* 33.0*  PLT 304 255   BMET Recent Labs    06/08/21 1316 06/09/21 0722  NA 132* 135  K 4.0 3.6  CL 104 106  CO2 19* 21*  GLUCOSE 101* 76  BUN 21 11  CREATININE 1.08 0.82  CALCIUM 9.2 8.4*   PT/INR Recent Labs    06/08/21 1946  LABPROT 15.0  INR 1.2   CMP     Component Value Date/Time   NA 135 06/09/2021 0722   K 3.6 06/09/2021 0722   CL 106 06/09/2021 0722   CO2 21 (L) 06/09/2021 0722   GLUCOSE 76 06/09/2021 0722   BUN 11 06/09/2021 0722   CREATININE 0.82 06/09/2021 0722   CALCIUM 8.4 (L) 06/09/2021 0722   PROT 6.2 (L) 06/09/2021 0722   ALBUMIN 2.5 (L) 06/09/2021 0722   AST 19 06/09/2021 0722   ALT 20 06/09/2021 0722   ALKPHOS 92 06/09/2021 0722   BILITOT 1.3 (H) 06/09/2021  0722   GFRNONAA >60 06/09/2021 0722   GFRAA  08/14/2007 0312    >60        The eGFR has been calculated using the MDRD equation. This calculation has not been validated in all clinical   Lipase     Component Value Date/Time   LIPASE 27 05/02/2021 1806    Studies/Results: CT ABDOMEN PELVIS W CONTRAST  Result Date: 06/08/2021 CLINICAL DATA:  History of recent bowel obstruction, hernia decreased bowel output EXAM: CT ABDOMEN AND PELVIS WITH CONTRAST TECHNIQUE: Multidetector CT imaging of the abdomen and pelvis was performed using the standard protocol following bolus administration of intravenous contrast. CONTRAST:  3m OMNIPAQUE IOHEXOL 350 MG/ML SOLN COMPARISON:  CT 06/02/2021, 05/19/2021, 05/02/2021, 05/05/2021 FINDINGS: Lower chest: Lung bases demonstrate no acute consolidation or effusion. Normal cardiac size. Coronary vascular calcification. Hepatobiliary: Low-density hepatic lesions probably cysts. Hyperdense stones or sludge in the gallbladder with wall thickening versus pericholecystic fluid. Stable prominent extrahepatic common bile duct measuring up to 8-9 mm. Pancreas: Unremarkable. No pancreatic ductal dilatation or surrounding inflammatory changes. Spleen: Normal in size without focal abnormality. Adrenals/Urinary Tract: Adrenal glands are normal. 9.8 cm left renal cyst. Other cysts in the kidneys. No hydronephrosis. The bladder is partially obscured by artifact. Stomach/Bowel: Stomach nonenlarged. Focal fluid-filled dilated segment  of small bowel left mid abdomen with surgical changes, unchanged. No convincing evidence for small bowel obstruction. No acute bowel wall thickening. Vascular/Lymphatic: Moderate aortic atherosclerosis. No aneurysm. No suspicious nodes Reproductive: Prostate is enlarged with calcification Other: Ventral wound. No free air or free fluid. Moderate to large left inguinal hernia containing fat and small bowel but no obstructive changes. Status post right  inguinal hernia repair with small amount of fat in the upper inguinal canal. Small residual inflammatory focus containing fat measuring 2 cm in the left lower quadrant mesentery, series 3, image 48 likely fat necrosis. Removal of previously noted right transgluteal percutaneous drainage catheter with minimal soft tissue thickening between the posterior bladder and rectum but no residual fluid collection. Musculoskeletal: Artifact from right hip arthroplasty. Degenerative changes of the lumbar spine without acute osseous abnormality. IMPRESSION: 1. Interim removal of right transgluteal percutaneous drainage catheter. Residual soft tissue thickening at the site of prior catheter but no measurable fluid collection. 2. Status post left abdominal bowel resection with anastomosis; similar mild fluid distension of small bowel at the anastomosis but no convincing evidence for a bowel obstruction at this time. Moderate stool burden. No acute bowel wall thickening. 2 cm focal inflammatory collection in the left abdominal mesentery near the anastomosis with central fat density likely evolving fat necrosis. 3. Hyperdense material in the gallbladder with persistent wall thickening and or pericholecystic fluid 4. Left inguinal hernia containing fat and small bowel but no evidence for an obstruction. Electronically Signed   By: Donavan Foil M.D.   On: 06/08/2021 17:46    Anti-infectives: Anti-infectives (From admission, onward)    Start     Dose/Rate Route Frequency Ordered Stop   06/09/21 2200  sulfamethoxazole-trimethoprim (BACTRIM DS) 800-160 MG per tablet 1 tablet        1 tablet Oral Every 12 hours 06/09/21 1556     06/09/21 1600  metroNIDAZOLE (FLAGYL) tablet 500 mg        500 mg Oral Every 8 hours 06/09/21 1556     06/08/21 1930  meropenem (MERREM) 1 g in sodium chloride 0.9 % 100 mL IVPB  Status:  Discontinued        1 g 200 mL/hr over 30 Minutes Intravenous Every 8 hours 06/08/21 1915 06/09/21 1552         Assessment/Plan 05/03/21 Open RIHR w/ mesh (Dr. Bobbye Morton) for incarcerated  05/09/21 - Dx lap/open SBR Dr. Donne Hazel 05/13/21 IR drainage pelvic abscess. Cx's w/ Klebsiella Oxytoca. Resolved, drain out - No residual fluid collection on CT 9/11 - BID WTD for midline wound  - Mobilize TID in halls, PT/OT   Acute cholecystitis - clinically has no pain; persistent PCCF on CT - asymptomatic - T. Bili 1.3 yesterday, LFT's otherwise wnl. Am labs pending - no intervention needed at this juncture   Ileus May have had constipation vs reactive ileus related to fat necrosis in vicinity of his small bowel anastomosis. This appears to have resolved after bowel regimen. Tolerating reg diet and having bowel function  Patient looks great this morning. AM labs pending. If these look okay, patient can be discharged from our standpoint and follow up in the office as previously scheduled. Will let [facility] know   FEN - Regular diet + Ensure; BID Miralax, PRN colace and suppository  VTE - SCDs, ok for anticoagulation from our perspective ID - Currently on bactrim/flagyl, this will certainly cover abdominal findings on CT. Consider 5d total of abx    Ascending thoracic aneurysm HLD  LOS: 2 days    Jillyn Ledger , [facility] Surgery 06/10/2021, 9:21 AM Please see Amion for pager number during day hours 7:00am-4:30pm

## 2021-06-10 NOTE — Progress Notes (Signed)
Gary Rice to be D/C'd Home per MD order.  Discussed with the patient and all questions fully answered.  VSS, Skin clean, dry and intact without evidence of skin break down, no evidence of skin tears noted. IV catheters discontinued intact. Site without signs and symptoms of complications. Dressing and pressure applied.  An After Visit Summary was printed and given to the patient. Patient received prescription.  D/c education completed with patient including follow up instructions, medication list, d/c activities limitations if indicated, with other d/c instructions as indicated by MD - patient able to verbalize understanding, all questions fully answered.   Patient instructed to return to ED, call 911, or call MD for any changes in condition.   Patient escorted via Gary Rice, and D/C home via private auto.  [name] 06/10/2021 2:20 PM

## 2021-06-10 NOTE — TOC Transition Note (Signed)
Transition of Care [facility]) - CM/SW Discharge Note   Patient Details  Name: Gary Rice MRN: [id number] Date of Birth: Feb 01, 1931  [facility] CM/SW Contact:  Gary Collet, RN Phone Number: 06/10/2021, 1:55 PM   Clinical Narrative:    Multiple attempts to reach wife, LVM. Could not reach patient. Plan for DC to home w [facility] services. Patient was active w [facility] prior to admission. Liaison notified of DC to home today, will set up for first home visit tomorrow. No other CM needs identified.     Final next level of care: Home w Home Health Services Barriers to Discharge: No Barriers Identified   Patient Goals and CMS Choice        Discharge Placement                       Discharge Plan and Services   Discharge Planning Services: CM Consult Post Acute Care Choice: Home Health          DME Arranged: N/A         HH Arranged: RN, PT [place]: [place] Date [place]: 06/10/21 Time [place]: 1354 Representative spoke with at [place]: [place] ([place]) Interventions     Readmission Risk Interventions Readmission Risk Prevention Plan 05/26/2021  Post Dischage Appt Not Complete  Appt Comments pt going to INPT rehab  Medication Screening Complete  Transportation Screening Complete  Some recent data might be hidden

## 2021-06-12 DIAGNOSIS — D62 Acute posthemorrhagic anemia: Secondary | ICD-10-CM | POA: Diagnosis not present

## 2021-06-12 DIAGNOSIS — I700 Atherosclerosis of aorta: Secondary | ICD-10-CM | POA: Diagnosis not present

## 2021-06-12 DIAGNOSIS — K651 Peritoneal abscess: Secondary | ICD-10-CM | POA: Diagnosis not present

## 2021-06-12 DIAGNOSIS — I712 Thoracic aortic aneurysm, without rupture: Secondary | ICD-10-CM | POA: Diagnosis not present

## 2021-06-12 DIAGNOSIS — I4891 Unspecified atrial fibrillation: Secondary | ICD-10-CM | POA: Diagnosis not present

## 2021-06-12 DIAGNOSIS — E46 Unspecified protein-calorie malnutrition: Secondary | ICD-10-CM | POA: Diagnosis not present

## 2021-06-12 NOTE — Discharge Summary (Signed)
[facility] Discharge Summary   Patient: Gary Rice [facility] [id number]  PCP: Josetta Huddle, MD  Date of admission: 06/08/2021   Date of discharge: 06/10/2021     Discharge Diagnoses:  Principal diagnosis Severe constipation and ileus Active Problems:   Ileus ([place]) Recent abdominal surgery  Admitted From: Home Disposition:  Home   Recommendations for Outpatient Follow-up:  PCP: Follow-up with PCP in 1 week.   Follow-up Information     Josetta Huddle, MD. Schedule an appointment as soon as possible for a visit in 1 week(s).   Specialty: Internal Medicine Contact information: [address] [place] [address] [phone]         [facility] [place] Follow up.   Why: for home health services. They will call to set up appointment at the house Wednesday Contact information: [place] [address] [phone]                Discharge Instructions     Diet - low sodium heart healthy   Complete by: As directed    Discharge wound care:   Complete by: As directed    Stomach incision:  Cleanse with NS, pat gently dry. Fill defects with saline moistened roll gauze, top with dry gauze, ABD pads and secure with tape. Change PRN for soiling, otherwise twice daily.   Increase activity slowly   Complete by: As directed        Diet recommendation: Cardiac diet  Activity: The patient is advised to gradually reintroduce usual activities, as tolerated  Discharge Condition: stable  Code Status: Full code   History of present illness: As per the H and P dictated on admission, "Gary Rice  is a 87 y.o. male, with medical history of SVT, hyperlipidemia, atrial fibrillation, small bowel obstruction, s/p intervention on 05/03/2021 with mesh placement.  This was complicated with bowel perforation, status post bowel resection with primary anastomosis.  Also developed intra-abdominal infection with CT scan showed large pelvic  abscess, s/p drain placement by IR on 05/13/2021.  The abscess culture was growing Bacteroides and Klebsiella oxytoca, started on meropenem initially then switched to Bactrim and Flagyl for 2 weeks on 06/03/2021 as per ID recommendation. He was discharged from rehab on 06/05/2021. As per patient he has not had good bowel movement since the surgery, just had very small amount of stool every time he moved his bowels.  He denies fever or chills.  Denies abdominal pain.  No nausea or vomiting. CT abdomen/pelvis showed moderate stool burden, 2 cm focal inflammatory collection in the left abdominal mesentery near the anastomosis with central fat density likely evolving fat necrosis. General surgery has been consulted by ED provider. Patient denies chest pain or shortness of breath No headache or blurred vision Denies dysuria"  Hospital Course:  Summary of his active problems in the hospital is as following.   1.  Constipation CT scan of the abdomen shows evidence of moderate stool burden. Had a bowel movement. Tolerating diet. Continue stool softener.   2.  Recent SBO with incarcerated hernia 8/6 open right inguinal hernia repair with mesh 8/12 expiratory laparotomy 8/16 IV) drainage of pelvic abscess Intra-abdominal infection secondary to Klebsiella oxytoca Was on Bactrim outpatient. Currently on meropenem. Regimen back to Bactrim Flagyl again. Monitor.   3.  PAF Rate controlled. Continue to monitor patient was on Eliquis at home, was transition to heparin. Given that no surgery is planned will transition back to Eliquis. Patient was only  using Cardizem as needed prior to his recent admission.  During his recent discharge from the CIR patient did not receive any Cardizem treatment for 3 days and his heart rate was well controlled.  Patient did not take this medication even at home and was not given any medication here in the hospital as well.  Thus I will discontinue scheduled Cardizem, and  resume as needed Cardizem as the patient was not using prior to admission.  Body mass index is 21.86 kg/m.    Nutrition Interventions:        Patient was ambulatory without any assistance. On the day of the discharge the patient's vitals were stable, and no other new acute medical condition were reported. The patient was felt safe to be discharge at Home with Home health.  Consultants: General surgery  Procedures: none  DISCHARGE MEDICATION: Allergies as of 06/10/2021   No Known Allergies      Medication List     STOP taking these medications    amoxicillin-clavulanate 875-125 MG tablet Commonly known as: AUGMENTIN   diltiazem 180 MG 24 hr capsule Commonly known as: Cardizem CD   multivitamin with minerals Tabs tablet   senna 8.6 MG Tabs tablet Commonly known as: SENOKOT       TAKE these medications    apixaban 5 MG Tabs tablet Commonly known as: ELIQUIS Take 1 tablet (5 mg total) by mouth 2 (two) times daily.   diltiazem 30 MG tablet Commonly known as: CARDIZEM Take 30 mg by mouth daily as needed (blood pressure, chest pain).   docusate sodium 100 MG capsule Commonly known as: COLACE Take 1 capsule (100 mg total) by mouth 2 (two) times daily. Surfactant   metroNIDAZOLE 500 MG tablet Commonly known as: FLAGYL Take 1 tablet (500 mg total) by mouth every 12 (twelve) hours for 14 days.   pantoprazole 40 MG tablet Commonly known as: PROTONIX Take 1 tablet (40 mg total) by mouth daily.   polyethylene glycol 17 g packet Commonly known as: MIRALAX / GLYCOLAX Take 17 g by mouth daily. Stool softener   PreserVision AREDS 2 Caps Take 1 capsule 2 (two) times daily by mouth.   sulfamethoxazole-trimethoprim 800-160 MG tablet Commonly known as: BACTRIM DS Take 1 tablet by mouth every 12 (twelve) hours for 14 days.   VITAMIN E PO Take 1 capsule by mouth daily.               Discharge Care Instructions  (From admission, onward)            Start     Ordered   06/10/21 0000  Discharge wound care:       Comments: Stomach incision:  Cleanse with NS, pat gently dry. Fill defects with saline moistened roll gauze, top with dry gauze, ABD pads and secure with tape. Change PRN for soiling, otherwise twice daily.   06/10/21 1315            Discharge Exam: Filed Weights   06/08/21 1907 06/09/21 1826  Weight: 75.9 kg 71.1 kg   Vitals:   06/10/21 0800 06/10/21 1205  BP: 96/64 110/65  Pulse: 94 74  Resp: 19 18  Temp: 98.7 F (37.1 C) 98 F (36.7 C)  SpO2: 91% 94%   General: Appear in mild distress, no Rash; Oral Mucosa Clear, moist. no Abnormal Neck Mass Or lumps, Conjunctiva normal  Cardiovascular: S1 and S2 Present, no Murmur, Respiratory: good respiratory effort, Bilateral Air entry present and CTA, no Crackles, no wheezes  Abdomen: Bowel Sound present, Soft and no tenderness Extremities: no Pedal edema Neurology: alert and oriented to time, place, and person affect appropriate. no new focal deficit Gait not checked due to patient safety concerns   The results of significant diagnostics from this hospitalization (including imaging, microbiology, ancillary and laboratory) are listed below for reference.    Significant Diagnostic Studies: CT ABDOMEN PELVIS W CONTRAST  Result Date: 06/08/2021 CLINICAL DATA:  History of recent bowel obstruction, hernia decreased bowel output EXAM: CT ABDOMEN AND PELVIS WITH CONTRAST TECHNIQUE: Multidetector CT imaging of the abdomen and pelvis was performed using the standard protocol following bolus administration of intravenous contrast. CONTRAST:  71m OMNIPAQUE IOHEXOL 350 MG/ML SOLN COMPARISON:  CT 06/02/2021, 05/19/2021, 05/02/2021, 05/05/2021 FINDINGS: Lower chest: Lung bases demonstrate no acute consolidation or effusion. Normal cardiac size. Coronary vascular calcification. Hepatobiliary: Low-density hepatic lesions probably cysts. Hyperdense stones or sludge in the gallbladder with  wall thickening versus pericholecystic fluid. Stable prominent extrahepatic common bile duct measuring up to 8-9 mm. Pancreas: Unremarkable. No pancreatic ductal dilatation or surrounding inflammatory changes. Spleen: Normal in size without focal abnormality. Adrenals/Urinary Tract: Adrenal glands are normal. 9.8 cm left renal cyst. Other cysts in the kidneys. No hydronephrosis. The bladder is partially obscured by artifact. Stomach/Bowel: Stomach nonenlarged. Focal fluid-filled dilated segment of small bowel left mid abdomen with surgical changes, unchanged. No convincing evidence for small bowel obstruction. No acute bowel wall thickening. Vascular/Lymphatic: Moderate aortic atherosclerosis. No aneurysm. No suspicious nodes Reproductive: Prostate is enlarged with calcification Other: Ventral wound. No free air or free fluid. Moderate to large left inguinal hernia containing fat and small bowel but no obstructive changes. Status post right inguinal hernia repair with small amount of fat in the upper inguinal canal. Small residual inflammatory focus containing fat measuring 2 cm in the left lower quadrant mesentery, series 3, image 48 likely fat necrosis. Removal of previously noted right transgluteal percutaneous drainage catheter with minimal soft tissue thickening between the posterior bladder and rectum but no residual fluid collection. Musculoskeletal: Artifact from right hip arthroplasty. Degenerative changes of the lumbar spine without acute osseous abnormality. IMPRESSION: 1. Interim removal of right transgluteal percutaneous drainage catheter. Residual soft tissue thickening at the site of prior catheter but no measurable fluid collection. 2. Status post left abdominal bowel resection with anastomosis; similar mild fluid distension of small bowel at the anastomosis but no convincing evidence for a bowel obstruction at this time. Moderate stool burden. No acute bowel wall thickening. 2 cm focal inflammatory  collection in the left abdominal mesentery near the anastomosis with central fat density likely evolving fat necrosis. 3. Hyperdense material in the gallbladder with persistent wall thickening and or pericholecystic fluid 4. Left inguinal hernia containing fat and small bowel but no evidence for an obstruction. Electronically Signed   By: KDonavan FoilM.D.   On: 06/08/2021 17:46   CT ABDOMEN PELVIS W CONTRAST  Result Date: 06/02/2021 CLINICAL DATA:  Abdominal pain, acute, nonlocalized pt already HAS pelvic abscess- trying to decide next course with ABX or now- WBC still 14.5 EXAM: CT ABDOMEN AND PELVIS WITH CONTRAST TECHNIQUE: Multidetector CT imaging of the abdomen and pelvis was performed using the standard protocol following bolus administration of intravenous contrast. CONTRAST:  732mOMNIPAQUE IOHEXOL 350 MG/ML SOLN COMPARISON:  MRI May 20, 2021 and CT May 19, 2021 FINDINGS: Lower chest: No acute abnormality.  Coronary artery calcifications. Hepatobiliary: Scattered hepatic cysts. No solid enhancing hepatic lesion. Layering sludge and stones in the gallbladder. The  gallbladder is no longer distended however there is pericholecystic fluid. Common bile duct is prominent with gentle tapering to the level of again ampullary, with the CBD measuring 8 mm which is within normal limits for patient's age but is slightly increased in comparison to prior MRI when it measured 6-7 mm. Pancreas: No evidence of acute inflammation or pancreatic ductal dilation. Spleen: Within normal limits. Adrenals/Urinary Tract: Bilateral adrenal glands are unremarkable. 9.7 cm left upper pole renal cyst. Urinary bladder is unremarkable for degree of distension. Stomach/Bowel: Radiopaque enteric contrast traverses the cecum. No pathologic dilation of small or large bowel. Left lower quadrant anastomotic sutures with a small collection adjacent to the anastomosis measuring 2 cm on image 49/3 previously 2.3 cm. Vascular/Lymphatic:  Aortic atherosclerosis without abdominal aortic aneurysm. No pathologically enlarged abdominal or pelvic lymph nodes. Reproductive: Prostatomegaly. Other: Significant decrease in size/near complete resolution of the fluid collection anterior to the rectum with posterior approach pelvic drainage catheter in place, the walls of the collection are now decompressed around the pigtail drainage catheter measuring approximately 3.3 x 2.1 cm previously 6.6 x 3.4 cm. No new walled off fluid collections. No pneumoperitoneum. Fat and nonobstructed small bowel containing left inguinal hernia. Fat and fluid containing right inguinal hernia. Musculoskeletal: Similar appearance of the postsurgical anterior abdominal wall cutaneous skin defect. Subcutaneous edema in the bilateral flanks and extending along the right anterior pelvic wall to the inguinal canal. Right hip arthroplasty. Multilevel degenerative changes spine. No acute osseous abnormality. IMPRESSION: 1. Significant decrease in size/near complete resolution of the fluid collection anterior to the rectum with posterior approach pelvic drainage catheter in place. The walls of the collection are now decompressed around the pigtail drainage catheter. 2. Similar size of the collection adjacent to the left lower quadrant bowel anastomotic sutures measuring 2 cm. No new walled off fluid collections. 3. Layering sludge and stones in the gallbladder with pericholecystic fluid. The gallbladder is no longer distended however there is pericholecystic fluid. 4. Common bile duct is prominent with gentle tapering to the level of again ampullary, with the CBD measuring 8 mm which is within normal limits for patient's age but is slightly increased in comparison to prior MRI when it measured 6-7 mm. Suggest correlation with laboratory values for biliary obstruction. 5. Subcutaneous edema in the bilateral flanks and extending along the right anterior pelvic wall to the inguinal canal.  Correlate for cellulitis. 6. Similar appearance of the postsurgical anterior abdominal wall cutaneous skin defect. 7. Fat and nonobstructed small bowel containing left inguinal hernia. 8. Fat and fluid containing right inguinal hernia. 9.  Aortic Atherosclerosis (ICD10-I70.0). Electronically Signed   By: Dahlia Bailiff M.D.   On: 06/02/2021 19:03   CT ABDOMEN PELVIS W CONTRAST  Result Date: 05/19/2021 CLINICAL DATA:  Acute nonlocalized abdominal pain. Postop day 17 after open right inguinal hernia repair with mesh. EXAM: CT ABDOMEN AND PELVIS WITH CONTRAST TECHNIQUE: Multidetector CT imaging of the abdomen and pelvis was performed using the standard protocol following bolus administration of intravenous contrast. CONTRAST:  138m OMNIPAQUE IOHEXOL 300 MG/ML  SOLN COMPARISON:  05/13/2021 FINDINGS: Lower chest: Small right pleural effusion with basilar atelectasis, similar to prior study. Hepatobiliary: No focal liver lesions demonstrated. The gallbladder is distended with multiple stones. Gallbladder wall thickening and edema with pericholecystic stranding likely indicates acute cholecystitis. Intra and extrahepatic bile duct dilatation is present. Pancreas: Unremarkable. No pancreatic ductal dilatation or surrounding inflammatory changes. Spleen: Normal in size without focal abnormality. Adrenals/Urinary Tract: No adrenal gland nodules.  Large cyst in the upper pole left kidney. Nephrograms are otherwise homogeneous and symmetrical. No hydronephrosis or hydroureter. Bladder is unremarkable. Stomach/Bowel: The stomach, small bowel, and colon are not abnormally distended. Scattered stool throughout the colon. Left lower quadrant bowel anastomosis again demonstrated with mild hazy infiltration in the adjacent mesenteric fat. Small collection adjacent to the anastomosis measures 2.3 cm. This collection is decreased in size since the previous study and the inflammatory infiltration is decreased. Loculated collection  in the pelvis anterior to the rectum is again demonstrated although significantly decreased in size since prior study, today measuring 3.4 x 6.6 cm. There has been interval placement of a pigtail drainage catheter into this collection via trans sciatic approach. The catheter pigtail appears to be centrally positioned within the residual collection. Additional small collection adjacent to the descending colon measuring 1.8 cm diameter is unchanged. No new collections are identified. Left inguinal hernia containing small bowel without proximal obstruction. No change. Vascular/Lymphatic: Aortic atherosclerosis. No enlarged abdominal or pelvic lymph nodes. Reproductive: Prostate gland is enlarged. Other: Small amount of free fluid in the abdomen, likely reactive. No free air identified. Anterior abdominal wall subcutaneous defect is likely postoperative and unchanged. Subcutaneous edema in the pelvis. Musculoskeletal: Lumbar scoliosis convex towards the right with diffuse degenerative change. Right hip arthroplasty. IMPRESSION: 1. Small right pleural effusion with basilar atelectasis. 2. Gallbladder distention. Multiple gallstones with gallbladder wall thickening and edema and pericholecystic stranding. Bile duct dilatation. Changes may indicate acute cholecystitis. 3. Pigtail drainage catheter placed in a pelvic collection. Significant interval decrease in the size of this collection with additional smaller collections unchanged. Decreased infiltrative change in the mesentery. 4. Left inguinal hernia containing small bowel without proximal obstruction, unchanged. 5. Aortic atherosclerosis. 6. Enlarged prostate gland. Electronically Signed   By: Lucienne Capers M.D.   On: 05/19/2021 18:42   DG CHEST PORT 1 VIEW  Result Date: 05/17/2021 CLINICAL DATA:  Dyspnea. EXAM: PORTABLE CHEST 1 VIEW COMPARISON:  05/15/2021 and older studies. FINDINGS: Cardiac silhouette normal in size.  No mediastinal or hilar masses. Linear  atelectasis noted at the left lung base, stable. Lungs otherwise clear. No convincing pleural effusion and no pneumothorax. Right PICC is stable, tip in the mid superior vena cava. IMPRESSION: 1. No acute cardiopulmonary disease. 2. Status post removal of the nasal/orogastric tube since the previous exam. Electronically Signed   By: Lajean Manes M.D.   On: 05/17/2021 12:13   DG CHEST PORT 1 VIEW  Result Date: 05/15/2021 CLINICAL DATA:  PICC line complication. EXAM: PORTABLE CHEST 1 VIEW COMPARISON:  Chest x-ray dated May 11, 2021. FINDINGS: Slight interval retraction in the tip of the right upper extremity PICC line, though it remains within the mid SVC. Unchanged enteric tube. Stable cardiomediastinal silhouette. No focal consolidation, pleural effusion, or pneumothorax. No acute osseous abnormality. IMPRESSION: 1. Slight interval retraction of the right upper extremity PICC line, though it remains within the mid SVC. 2. No active disease. Electronically Signed   By: Titus Dubin M.D.   On: 05/15/2021 08:58   MR ABDOMEN MRCP W WO CONTAST  Result Date: 05/21/2021 CLINICAL DATA:  Inpatient. Hyperbilirubinemia. Acute cholecystitis and mild biliary ductal dilatation on sonogram. Abdominal pain. Recent right inguinal hernia mesh repair. Pelvic abscess status post percutaneous drainage. EXAM: MRI ABDOMEN WITHOUT AND WITH CONTRAST (INCLUDING MRCP) TECHNIQUE: Multiplanar multisequence MR imaging of the abdomen was performed both before and after the administration of intravenous contrast. Heavily T2-weighted images of the biliary and pancreatic ducts were obtained,  and three-dimensional MRCP images were rendered by post processing. CONTRAST:  7.49m GADAVIST GADOBUTROL 1 MMOL/ML IV SOLN COMPARISON:  05/19/2021 CT abdomen/pelvis and 05/20/2021 abdominal sonogram. FINDINGS: Severely motion degraded scan, limiting assessment. Lower chest: Small dependent right pleural effusion. Hepatobiliary: Relative atrophy of  the left liver lobe, particularly the lateral segment left liver. Normal configuration of the right liver and caudate lobe. No hepatic steatosis. Scattered simple subcentimeter liver cysts. Numerous layering subcentimeter gallstones and layering sludge in the distended gallbladder with moderate diffuse gallbladder wall thickening. Small amount of pericholecystic fluid. No significant intrahepatic biliary ductal dilatation. The intrahepatic bile ducts in the atrophic lateral segment left liver lobe are irregular and beaded in contour on the MRCP sequence (series 8/image 121). Top-normal caliber common bile duct with diameter 6 mm. No evidence of a centrally obstructing left liver mass. No discrete biliary filling defects to suggest choledocholithiasis. Pancreas: No pancreatic mass or duct dilation.  No pancreas divisum. Spleen: Normal size. No mass. Adrenals/Urinary Tract: Normal adrenals. No hydronephrosis. Simple bilateral renal cysts, largest 9.7 cm in the posterior upper left kidney. No suspicious renal masses. Stomach/Bowel: Normal non-distended stomach. Visualized small and large bowel is normal caliber, with no bowel wall thickening. Vascular/Lymphatic: Atherosclerotic nonaneurysmal abdominal aorta. Patent portal, splenic, hepatic and renal veins. Retroaortic left renal vein. No pathologically enlarged lymph nodes in the abdomen. Other: No abdominal ascites or focal fluid collection. Musculoskeletal: No aggressive appearing focal osseous lesions. Moderate dextrocurvature of the lumbar spine with associated advanced degenerative disc disease. IMPRESSION: 1. Limited motion degraded scan. 2. Cholelithiasis, layering sludge, gallbladder distension, moderate diffuse gallbladder wall thickening and small amount of pericholecystic fluid. Findings are compatible with acute cholecystitis. 3. Top-normal caliber common bile duct measuring 6 mm. No evidence of choledocholithiasis. No significant intrahepatic biliary  ductal dilatation. 4. Relative atrophy of the lateral segment left liver lobe with associated irregular and beaded contour of the intrahepatic bile ducts in the lateral segment left liver lobe. No evidence of a centrally obstructing left liver mass. 5. Small dependent right pleural effusion. Electronically Signed   By: JIlona SorrelM.D.   On: 05/21/2021 21:29   [place]Abdomen Limited RUQ (LIVER/GB)  Result Date: 05/20/2021 CLINICAL DATA:  87year old male with abnormal gallbladder on CT Abdomen and Pelvis. Abdominal pain, postop day 18 inguinal hernia repair with mesh. EXAM: ULTRASOUND ABDOMEN LIMITED RIGHT UPPER QUADRANT COMPARISON:  CT Abdomen and Pelvis 05/19/2021. FINDINGS: Gallbladder: Gallbladder distended with sludge (image 7) and there is abnormal gallbladder wall thickening up to 6 mm (image 10). Dependent shadowing stones, gravel-like on image 16. Positive sonographic Murphy sign. Common bile duct: Diameter: 8 mm, mildly dilated. Liver: No focal lesion identified. Within normal limits in parenchymal echogenicity. Mild intrahepatic biliary ductal dilatation was better demonstrated by CT. Portal vein is patent on color Doppler imaging with normal direction of blood flow towards the liver. Other: Negative visible right kidney. IMPRESSION: 1. Acute Cholecystitis: gallbladder distended with sludge and stones, thickened wall, and positive sonographic Murphy sign. 2. Mild intra- and extrahepatic biliary ductal dilatation suspicious for associated choledocholithiasis. Electronically Signed   By: HGenevie AnnM.D.   On: 05/20/2021 11:25    Microbiology: Recent Results (from the past 240 hour(s))  Resp Panel by RT-PCR (Flu A&B, Covid) Nasopharyngeal Swab     Status: None   Collection Time: 06/08/21  5:08 PM   Specimen: Nasopharyngeal Swab; Nasopharyngeal(NP) swabs in vial transport medium  Result Value Ref Range Status   SARS Coronavirus 2 by RT PCR NEGATIVE  NEGATIVE Final    Comment: (NOTE) SARS-CoV-2 target  nucleic acids are NOT DETECTED.  The SARS-CoV-2 RNA is generally detectable in upper respiratory specimens during the acute phase of infection. The lowest concentration of SARS-CoV-2 viral copies this assay can detect is 138 copies/mL. A negative result does not preclude SARS-Cov-2 infection and should not be used as the sole basis for treatment or other patient management decisions. A negative result may occur with  improper specimen collection/handling, submission of specimen other than nasopharyngeal swab, presence of viral mutation(s) within the areas targeted by this assay, and inadequate number of viral copies(<138 copies/mL). A negative result must be combined with clinical observations, patient history, and epidemiological information. The expected result is Negative.  Fact Sheet for Patients:  EntrepreneurPulse.com.au  Fact Sheet for Healthcare Providers:  IncredibleEmployment.be  This test is no t yet approved or cleared by the [place] FDA and  has been authorized for detection and/or diagnosis of SARS-CoV-2 by FDA under an Emergency Use Authorization (EUA). This EUA will remain  in effect (meaning this test can be used) for the duration of the COVID-19 declaration under Section 564(b)(1) of the Act, 21 U.S.C.section 360bbb-3(b)(1), unless the authorization is terminated  or revoked sooner.       Influenza A by PCR NEGATIVE NEGATIVE Final   Influenza B by PCR NEGATIVE NEGATIVE Final    Comment: (NOTE) The Xpert Xpress SARS-CoV-2/FLU/RSV plus assay is intended as an aid in the diagnosis of influenza from Nasopharyngeal swab specimens and should not be used as a sole basis for treatment. Nasal washings and aspirates are unacceptable for Xpert Xpress SARS-CoV-2/FLU/RSV testing.  Fact Sheet for Patients: EntrepreneurPulse.com.au  Fact Sheet for Healthcare  Providers: IncredibleEmployment.be  This test is not yet approved or cleared by the [place] FDA and has been authorized for detection and/or diagnosis of SARS-CoV-2 by FDA under an Emergency Use Authorization (EUA). This EUA will remain in effect (meaning this test can be used) for the duration of the COVID-19 declaration under Section 564(b)(1) of the Act, 21 U.S.C. section 360bbb-3(b)(1), unless the authorization is terminated or revoked.  Performed at [facility], [place] [address] [place], [place] [address]      Labs: CBC: Recent Labs  Lab 06/08/21 1316 06/09/21 0722 06/10/21 0935  WBC 16.3* 12.0* 9.8  NEUTROABS 11.5*  --   --   HGB 11.5* 11.0* 11.6*  HCT 35.0* 33.0* 34.2*  MCV 92.3 93.2 90.2  PLT 304 255 [id number]   Basic Metabolic Panel: Recent Labs  Lab 06/08/21 1316 06/08/21 1404 06/09/21 0722 06/10/21 0935  NA 132*  --  135 133*  K 4.0  --  3.6 4.2  CL 104  --  106 101  CO2 19*  --  21* 24  GLUCOSE 101*  --  76 138*  BUN 21  --  11 12  CREATININE 1.08  --  0.82 0.83  CALCIUM 9.2  --  8.4* 9.0  MG  --  1.9  --   --    Liver Function Tests: Recent Labs  Lab 06/08/21 1316 06/09/21 0722 06/10/21 0935  AST '22 19 21  '$ ALT '24 20 21  '$ ALKPHOS 113 92 93  BILITOT 0.8 1.3* 1.1  PROT 7.6 6.2* 6.8  ALBUMIN 3.1* 2.5* 2.6*   CBG: No results for input(s): GLUCAP in the last 168 hours.  Time spent: 35 minutes  Signed:  Berle Mull  [facility] 06/10/2021

## 2021-06-17 DIAGNOSIS — E46 Unspecified protein-calorie malnutrition: Secondary | ICD-10-CM | POA: Diagnosis not present

## 2021-06-17 DIAGNOSIS — D62 Acute posthemorrhagic anemia: Secondary | ICD-10-CM | POA: Diagnosis not present

## 2021-06-17 DIAGNOSIS — I4891 Unspecified atrial fibrillation: Secondary | ICD-10-CM | POA: Diagnosis not present

## 2021-06-17 DIAGNOSIS — I700 Atherosclerosis of aorta: Secondary | ICD-10-CM | POA: Diagnosis not present

## 2021-06-17 DIAGNOSIS — K651 Peritoneal abscess: Secondary | ICD-10-CM | POA: Diagnosis not present

## 2021-06-17 DIAGNOSIS — I712 Thoracic aortic aneurysm, without rupture: Secondary | ICD-10-CM | POA: Diagnosis not present

## 2021-06-18 ENCOUNTER — Ambulatory Visit (INDEPENDENT_AMBULATORY_CARE_PROVIDER_SITE_OTHER): Payer: Medicare Other | Admitting: Internal Medicine

## 2021-06-18 ENCOUNTER — Other Ambulatory Visit: Payer: Self-pay

## 2021-06-18 ENCOUNTER — Encounter: Payer: Self-pay | Admitting: Internal Medicine

## 2021-06-18 VITALS — BP 125/83 | HR 90 | Temp 97.9°F | Wt 161.0 lb

## 2021-06-18 DIAGNOSIS — K651 Peritoneal abscess: Secondary | ICD-10-CM | POA: Diagnosis not present

## 2021-06-18 DIAGNOSIS — K460 Unspecified abdominal hernia with obstruction, without gangrene: Secondary | ICD-10-CM

## 2021-06-18 DIAGNOSIS — K810 Acute cholecystitis: Secondary | ICD-10-CM | POA: Diagnosis not present

## 2021-06-18 NOTE — Patient Instructions (Signed)
Thank you for coming to see me today. It was a pleasure seeing you.  To Do: Finish your antibiotics in 2 days Follow up as needed with me Schedule an appointment with surgery: Rolm Bookbinder, MD Follow up.   Specialty: General Surgery Why: Call for appointment Contact information: [place] [address] [phone]   If you have any questions or concerns, please do not hesitate to call the office at [phone].  Take Care,   Jule Ser, DO

## 2021-06-18 NOTE — Progress Notes (Signed)
[place] for Infectious Disease  CHIEF COMPLAINT:    Follow up for intra-abdominal abcess  SUBJECTIVE:    Gary Rice is a 87 y.o. male with PMHx as below who presents to the clinic for IAA.   Prolonged hospitalization 8/5-8/29/22 for incarcerated right inguinal hernia s/p surgical intervention with mesh placement on 12/05/74 complicated by continued abdominal pain s/p diagnostic laparascopy 8/12 that showed micro perforation s/p bowel resection with primary anastomosis and perforation repair.   This was complicated by development of IAA noted on follow up CT scan s/p IR drain placement 8/16.  Cultures grew MDR Kleb oxytoca and Bacteroides.   Unfortunately, he also developed acute cholecystitis that was managed conservatively given overall clinical picture.  His LFTs improved.  He was discharged to CIR where he left on 9/7.  He was treated with Meropenem then transitioned to Bactrim/Flagyl per ID recommendation when last seen in the hospital 9/6.  His IR drain for IAA was removed that day as well after imaging showed resolution of his fluid collection where drain was and other fluid collection was similar about 2 cm in size  He had to be readmitted on 9/11-9/13/22 for ileus.  Another CT on 9/11 showed same focal collection about 2 cm that was likely fat necrosis.  Otherwise no recurrence of abscess.  He completes antibiotics in 2 days.  As of 9/13 is WBC was 9.8, AST 21, ALT 21, AP 93, Tbili 1.1, and albumin 2.6.  He is feeling better today.  No fevers, abdominal pain.  He is packing his wound which is now about the size of a quarter and the packing is dry.  He has not seen general surgery since discharge.   Please see A&P for the details of today's visit and status of the patient's medical problems.   Patient's Medications  New Prescriptions   No medications on file  Previous Medications   APIXABAN (ELIQUIS) 5 MG TABS TABLET    Take 1 tablet (5 mg total) by mouth 2 (two)  times daily.   DILTIAZEM (CARDIZEM) 30 MG TABLET    Take 30 mg by mouth daily as needed (blood pressure, chest pain).   DOCUSATE SODIUM (COLACE) 100 MG CAPSULE    Take 1 capsule (100 mg total) by mouth 2 (two) times daily. Surfactant   METRONIDAZOLE (FLAGYL) 500 MG TABLET    Take 500 mg by mouth 3 (three) times daily.   MULTIPLE VITAMINS-MINERALS (PRESERVISION AREDS 2) CAPS    Take 1 capsule 2 (two) times daily by mouth.   PANTOPRAZOLE (PROTONIX) 40 MG TABLET    Take 1 tablet (40 mg total) by mouth daily.   POLYETHYLENE GLYCOL (MIRALAX / GLYCOLAX) 17 G PACKET    Take 17 g by mouth daily. Stool softener   SULFAMETHOXAZOLE-TRIMETHOPRIM (BACTRIM DS) 800-160 MG TABLET    Take 1 tablet by mouth 2 (two) times daily.   VITAMIN E PO    Take 1 capsule by mouth daily.   Modified Medications   No medications on file  Discontinued Medications   No medications on file      Past Medical History:  Diagnosis Date   Astigmatism    Basal cell carcinoma    Inguinal hernia    bilateral   Macular degeneration, age related, nonexudative    bilateral   SVT (supraventricular tachycardia) (HCC)     Social History   Tobacco Use   Smoking status: Former    Types: Cigarettes  Quit date: 10/24/1950    Years since quitting: 70.6   Smokeless tobacco: Never  Vaping Use   Vaping Use: Never used  Substance Use Topics   Alcohol use: No   Drug use: No    Family History  Problem Relation Age of Onset   Heart disease Father    Heart attack Father    Heart attack Son    Sleep apnea Son     No Known Allergies  Review of Systems  Constitutional: Negative.   Gastrointestinal:  Negative for abdominal pain, nausea and vomiting.  Skin: Negative.     OBJECTIVE:    Vitals:   06/18/21 1525  BP: 125/83  Pulse: 90  Temp: 97.9 F (36.6 C)  TempSrc: Oral  SpO2: 96%  Weight: 161 lb (73 kg)   Body mass index is 22.45 kg/m.  Physical Exam Constitutional:      General: He is not in acute  distress.    Appearance: Normal appearance.  HENT:     Head: Normocephalic and atraumatic.  Pulmonary:     Effort: Pulmonary effort is normal. No respiratory distress.  Abdominal:     General: There is no distension.     Palpations: Abdomen is soft.     Tenderness: There is no abdominal tenderness.  Musculoskeletal:        General: Normal range of motion.  Skin:    General: Skin is warm and dry.     Coloration: Skin is not jaundiced.     Findings: No rash.  Neurological:     General: No focal deficit present.     Mental Status: He is alert and oriented to person, place, and time.  Psychiatric:        Mood and Affect: Mood normal.        Behavior: Behavior normal.     Labs and Microbiology: CBC Latest Ref Rng & Units 06/10/2021 06/09/2021 06/08/2021  WBC 4.0 - 10.5 K/uL 9.8 12.0(H) 16.3(H)  Hemoglobin 13.0 - 17.0 g/dL 11.6(L) 11.0(L) 11.5(L)  Hematocrit 39.0 - 52.0 % 34.2(L) 33.0(L) 35.0(L)  Platelets 150 - 400 K/uL 330 255 304   CMP Latest Ref Rng & Units 06/10/2021 06/09/2021 06/08/2021  Glucose 70 - 99 mg/dL 138(H) 76 101(H)  BUN 8 - 23 mg/dL 12 11 21   Creatinine 0.61 - 1.24 mg/dL 0.83 0.82 1.08  Sodium 135 - 145 mmol/L 133(L) 135 132(L)  Potassium 3.5 - 5.1 mmol/L 4.2 3.6 4.0  Chloride 98 - 111 mmol/L 101 106 104  CO2 22 - 32 mmol/L 24 21(L) 19(L)  Calcium 8.9 - 10.3 mg/dL 9.0 8.4(L) 9.2  Total Protein 6.5 - 8.1 g/dL 6.8 6.2(L) 7.6  Total Bilirubin 0.3 - 1.2 mg/dL 1.1 1.3(H) 0.8  Alkaline Phos 38 - 126 U/L 93 92 113  AST 15 - 41 U/L 21 19 22   ALT 0 - 44 U/L 21 20 24      Recent Results (from the past 240 hour(s))  Resp Panel by RT-PCR (Flu A&B, Covid) Nasopharyngeal Swab     Status: None   Collection Time: 06/08/21  5:08 PM   Specimen: Nasopharyngeal Swab; Nasopharyngeal(NP) swabs in vial transport medium  Result Value Ref Range Status   SARS Coronavirus 2 by RT PCR NEGATIVE NEGATIVE Final    Comment: (NOTE) SARS-CoV-2 target nucleic acids are NOT DETECTED.  The  SARS-CoV-2 RNA is generally detectable in upper respiratory specimens during the acute phase of infection. The lowest concentration of SARS-CoV-2 viral copies this assay can detect  is 138 copies/mL. A negative result does not preclude SARS-Cov-2 infection and should not be used as the sole basis for treatment or other patient management decisions. A negative result may occur with  improper specimen collection/handling, submission of specimen other than nasopharyngeal swab, presence of viral mutation(s) within the areas targeted by this assay, and inadequate number of viral copies(<138 copies/mL). A negative result must be combined with clinical observations, patient history, and epidemiological information. The expected result is Negative.  Fact Sheet for Patients:  EntrepreneurPulse.com.au  Fact Sheet for Healthcare Providers:  IncredibleEmployment.be  This test is no t yet approved or cleared by the [place] FDA and  has been authorized for detection and/or diagnosis of SARS-CoV-2 by FDA under an Emergency Use Authorization (EUA). This EUA will remain  in effect (meaning this test can be used) for the duration of the COVID-19 declaration under Section 564(b)(1) of the Act, 21 U.S.C.section 360bbb-3(b)(1), unless the authorization is terminated  or revoked sooner.       Influenza A by PCR NEGATIVE NEGATIVE Final   Influenza B by PCR NEGATIVE NEGATIVE Final    Comment: (NOTE) The Xpert Xpress SARS-CoV-2/FLU/RSV plus assay is intended as an aid in the diagnosis of influenza from Nasopharyngeal swab specimens and should not be used as a sole basis for treatment. Nasal washings and aspirates are unacceptable for Xpert Xpress SARS-CoV-2/FLU/RSV testing.  Fact Sheet for Patients: EntrepreneurPulse.com.au  Fact Sheet for Healthcare Providers: IncredibleEmployment.be  This test is not yet approved or  cleared by the [place] FDA and has been authorized for detection and/or diagnosis of SARS-CoV-2 by FDA under an Emergency Use Authorization (EUA). This EUA will remain in effect (meaning this test can be used) for the duration of the COVID-19 declaration under Section 564(b)(1) of the Act, 21 U.S.C. section 360bbb-3(b)(1), unless the authorization is terminated or revoked.  Performed at [facility], [place] [address] [place], [place] [address]      ASSESSMENT & PLAN:    1. Pelvic abscess in male [facility])  2. Cholecystitis, acute  3. Incarcerated hernia  He is doing well and will complete antibiotics in 2 days.  Recent imaging showed resolution of abscess after drain was removed.  No further antibiotics needed after he completes current course.  Advised follow up with general surgery and provided their contact information.  Return precautions discussed to monitor symptoms after stopping antibiotics for any recurrence of infection.  Follow up PRN.    Raynelle Highland for Infectious Disease [facility] 06/18/2021, 4:02 PM   I spent 40 minutes dedicated to the care of this patient on the date of this encounter to include pre-visit review of records, face-to-face time with the patient discussing abscess, cholecystitis, and hernia, and post-visit ordering of testing.

## 2021-06-19 DIAGNOSIS — K651 Peritoneal abscess: Secondary | ICD-10-CM | POA: Diagnosis not present

## 2021-06-19 DIAGNOSIS — K460 Unspecified abdominal hernia with obstruction, without gangrene: Secondary | ICD-10-CM | POA: Diagnosis not present

## 2021-06-19 DIAGNOSIS — K810 Acute cholecystitis: Secondary | ICD-10-CM | POA: Diagnosis not present

## 2021-06-19 DIAGNOSIS — Z09 Encounter for follow-up examination after completed treatment for conditions other than malignant neoplasm: Secondary | ICD-10-CM | POA: Diagnosis not present

## 2021-06-20 DIAGNOSIS — K651 Peritoneal abscess: Secondary | ICD-10-CM | POA: Diagnosis not present

## 2021-06-20 DIAGNOSIS — D62 Acute posthemorrhagic anemia: Secondary | ICD-10-CM | POA: Diagnosis not present

## 2021-06-20 DIAGNOSIS — I712 Thoracic aortic aneurysm, without rupture: Secondary | ICD-10-CM | POA: Diagnosis not present

## 2021-06-20 DIAGNOSIS — I4891 Unspecified atrial fibrillation: Secondary | ICD-10-CM | POA: Diagnosis not present

## 2021-06-20 DIAGNOSIS — I700 Atherosclerosis of aorta: Secondary | ICD-10-CM | POA: Diagnosis not present

## 2021-06-20 DIAGNOSIS — E46 Unspecified protein-calorie malnutrition: Secondary | ICD-10-CM | POA: Diagnosis not present

## 2021-06-23 NOTE — Progress Notes (Signed)
Cardiology Office Note    Date:  06/25/2021   ID:  Gary Rice, DOB 03-20-1931, MRN [id number]   PCP:  Josetta Huddle, MD   [place]  Cardiologist:  Candee Furbish, MD   Advanced Practice Provider:  No care team member to display Electrophysiologist:  None   [phone]   Chief Complaint  Patient presents with   Follow-up   Hospitalization Follow-up    History of Present Illness:  Gary Rice is a 87 y.o. male with history of SVT who developed new onset A. fib postop 04/2021.  2D echo 05/04/2021 LVEF 60 to 65% with trivial MR and trivial AI.  CHA2DS2-VASc was 2 due to age and he was sent home on Eliquis.  He was also found to have an ascending aortic aneurysm 4.8 cm.  Will need follow-up with vascular surgery  Patient had a small bowel obstruction status post right inguinal hernia repair with mesh placement 03/04/1244 complicated by bowel perforation status postresection and primary anastomosis.  He then developed infection with large pelvic abscess status postdrainage followed by ID.  He had new onset A. fib and was started on Eliquis.  He was readmitted with severe constipation and ileus and discharged home 06/10/2021.  He was continued on Eliquis for PAF and as needed diltiazem.  Patient comes in alone, drove himself here. Married 71 yrs. Trying to get his strength back. Denies palpitations, dyspnea or cardiac complaints. Asking about eliquis cost.    Past Medical History:  Diagnosis Date   Astigmatism    Basal cell carcinoma    Inguinal hernia    bilateral   Macular degeneration, age related, nonexudative    bilateral   SVT (supraventricular tachycardia) ([facility])     Past Surgical History:  Procedure Laterality Date   APPENDECTOMY     age 28   BOWEL RESECTION  05/09/2021   Procedure: SMALL BOWEL RESECTION;  Surgeon: Rolm Bookbinder, MD;  Location: [facility];  Service: General;;   CATARACT EXTRACTION, BILATERAL     HERNIA REPAIR     RIH    INGUINAL HERNIA REPAIR Right 05/02/2021   Procedure: REPAIR  INCARCERATED INGUINAL HERNIA  WITH MESH;  Surgeon: Jesusita Oka, MD;  Location: [facility];  Service: General;  Laterality: Right;   JOINT REPLACEMENT Right    RTHA   LAPAROSCOPY N/A 05/09/2021   Procedure: LAPAROSCOPY DIAGNOSTIC;  Surgeon: Rolm Bookbinder, MD;  Location: [employer] [facility];  Service: General;  Laterality: N/A;   LAPAROTOMY  05/09/2021   Procedure: EXPLORATORY LAPAROTOMY;  Surgeon: Rolm Bookbinder, MD;  Location: [facility];  Service: General;;   TONSILLECTOMY      Current Medications: Current Meds  Medication Sig   apixaban (ELIQUIS) 5 MG TABS tablet Take 1 tablet (5 mg total) by mouth 2 (two) times daily.   diltiazem (CARDIZEM) 30 MG tablet Take 30 mg by mouth daily as needed (blood pressure, chest pain).   docusate sodium (COLACE) 100 MG capsule Take 1 capsule (100 mg total) by mouth 2 (two) times daily. Surfactant   Multiple Vitamins-Minerals (PRESERVISION AREDS 2) CAPS Take 1 capsule 2 (two) times daily by mouth.   pantoprazole (PROTONIX) 40 MG tablet Take 1 tablet (40 mg total) by mouth daily.   polyethylene glycol (MIRALAX / GLYCOLAX) 17 g packet Take 17 g by mouth daily. Stool softener   VITAMIN E PO Take 1 capsule by mouth daily.      Allergies:   Patient has no known allergies.  Social History   Socioeconomic History   Marital status: Married    Spouse name: Not on file   Number of children: Not on file   Years of education: Not on file   Highest education level: Not on file  Occupational History   Not on file  Tobacco Use   Smoking status: Former    Types: Cigarettes    Quit date: 10/24/1950    Years since quitting: 70.7   Smokeless tobacco: Never  Vaping Use   Vaping Use: Never used  Substance and Sexual Activity   Alcohol use: No   Drug use: No   Sexual activity: Not on file  Other Topics Concern   Not on file  Social History Narrative   Not on file   Social Determinants of Health    Financial Resource Strain: Not on file  Food Insecurity: Not on file  Transportation Needs: Not on file  Physical Activity: Not on file  Stress: Not on file  Social Connections: Not on file     Family History:  The patient's  family history includes Heart attack in his father and son; Heart disease in his father; Sleep apnea in his son.   ROS:   Please see the history of present illness.    ROS All other systems reviewed and are negative.   PHYSICAL EXAM:   VS:  BP 106/62   Pulse 94   Ht 5\' 11"  (1.803 m)   Wt 161 lb (73 kg)   SpO2 98%   BMI 22.45 kg/m   Physical Exam  GEN: Thin, in no acute distress  Neck: no JVD, carotid bruits, or masses Cardiac:RRR; no murmurs, rubs, or gallops  Respiratory:  clear to auscultation bilaterally, normal work of breathing GI: soft, nontender, nondistended, + BS Ext: without cyanosis, clubbing, or edema, Good distal pulses bilaterally Neuro:  Alert and Oriented x 3, Strength and sensation are intact Psych: euthymic mood, full affect  Wt Readings from Last 3 Encounters:  06/25/21 161 lb (73 kg)  06/18/21 161 lb (73 kg)  06/09/21 156 lb 12 oz (71.1 kg)      Studies/Labs Reviewed:   EKG:  EKG is not ordered today.     Recent Labs: 05/03/2021: TSH 2.342 06/08/2021: Magnesium 1.9 06/10/2021: ALT 21; BUN 12; Creatinine, Ser 0.83; Hemoglobin 11.6; Platelets 330; Potassium 4.2; Sodium 133   Lipid Panel    Component Value Date/Time   TRIG 86 05/12/2021 0329    Additional studies/ records that were reviewed today include:   Echo 05/04/2021:    1. Left ventricular ejection fraction, by estimation, is 60 to 65%. The  left ventricle has normal function. The left ventricle has no regional  wall motion abnormalities. There is mild left ventricular hypertrophy.  Left ventricular diastolic parameters are indeterminate.   2. Right ventricular systolic function is normal. The right ventricular  size is normal. Tricuspid regurgitation signal is  inadequate for assessing  PA pressure.   3. The mitral valve is degenerative. Trivial mitral valve regurgitation.  No evidence of mitral stenosis.   4. The aortic valve is grossly normal. There is mild thickening of the  aortic valve. Aortic valve regurgitation is trivial. No aortic stenosis is  present.   5. The inferior vena cava is dilated in size with >50% respiratory  variability, suggesting right atrial pressure of 8 mmHg.   Risk Assessment/Calculations:    CHA2DS2-VASc Score = 2   This indicates a 2.2% annual risk of stroke. The patient's  score is based upon: CHF History: 0 HTN History: 0 Diabetes History: 0 Stroke History: 0 Vascular Disease History: 0 Age Score: 2 Gender Score: 0        ASSESSMENT:    1. Paroxysmal atrial fibrillation (HCC)   2. History of supraventricular tachycardia   3. Pelvic abscess in male [facility])      PLAN:  In order of problems listed above:  Atrial fibrillation CHA2DS2-VASc equals 2 on Eliquis and as needed diltiazem-no recurrence of palpitations. Refill [facility]iquis Rx[facility] and he'll check to see the cost through [facility] Echo 05/04/21 LVEF 60-65%, LA normal size.  History of SVT  SBO with incarcerated hernia status post right inguinal hernia repair with mesh 06/07/3715 complicated by bowel perforation status post bowel resection and primary anastomosis then developed pelvic abscess and just finished antibiotics.  Shared Decision Making/Informed Consent        Medication Adjustments/Labs and Tests Ordered: Current medicines are reviewed at length with the patient today.  Concerns regarding medicines are outlined above.  Medication changes, Labs and Tests ordered today are listed in the Patient Instructions below. Patient Instructions  Medication Instructions:  Your physician recommends that you continue on your current medications as directed. Please refer to the Current Medication list given to you today.  *If you need a refill on  your cardiac medications before your next appointment, please call your pharmacy*   Lab Work: None ordered   If you have labs (blood work) drawn today and your tests are completely normal, you will receive your results only by: [place] (if you have MyChart) OR A paper copy in the mail If you have any lab test that is abnormal or we need to change your treatment, we will call you to review the results.   Testing/Procedures: None ordered    Follow-Up: Follow up as scheduled    Other Instructions None     Signed, Ermalinda Barrios, PA-C  06/25/2021 12:59 PM    [facility] [place], [place], [place]  [address] Phone: [phone]; Fax: [phone]

## 2021-06-25 ENCOUNTER — Ambulatory Visit (INDEPENDENT_AMBULATORY_CARE_PROVIDER_SITE_OTHER): Payer: Medicare Other | Admitting: Physician Assistant

## 2021-06-25 ENCOUNTER — Encounter: Payer: Self-pay | Admitting: Physician Assistant

## 2021-06-25 ENCOUNTER — Other Ambulatory Visit: Payer: Self-pay

## 2021-06-25 VITALS — BP 106/62 | HR 94 | Ht 71.0 in | Wt 161.0 lb

## 2021-06-25 DIAGNOSIS — K651 Peritoneal abscess: Secondary | ICD-10-CM | POA: Diagnosis not present

## 2021-06-25 DIAGNOSIS — I480 Paroxysmal atrial fibrillation: Secondary | ICD-10-CM | POA: Diagnosis not present

## 2021-06-25 DIAGNOSIS — Z8679 Personal history of other diseases of the circulatory system: Secondary | ICD-10-CM

## 2021-06-25 MED ORDER — APIXABAN 5 MG PO TABS: 5.0000 mg | ORAL_TABLET | Freq: Two times a day (BID) | ORAL | 0 refills | Status: AC

## 2021-06-25 NOTE — Patient Instructions (Signed)

## 2021-06-25 NOTE — Telephone Encounter (Signed)
Prescription refill request for Eliquis received. Indication:Afib  Last office visit: 06/25/21 Bonnell Public, PA)  Scr: 0.83 (06/10/21) Age: 87 Weight: 73kg  Appropriate dose and refill sent to requested pharmacy.

## 2021-07-02 ENCOUNTER — Telehealth: Payer: Self-pay | Admitting: Cardiology

## 2021-07-02 NOTE — Telephone Encounter (Signed)
  *  STAT* If patient is at the pharmacy, call can be transferred to refill team.   1. Which medications need to be refilled? (please list name of each medication and dose if known) pantoprazole (PROTONIX) 40 MG tablet  2. Which pharmacy/location (including street and city if local pharmacy) is medication to be sent to? [id number] - [place], [place]  3. Do they need a 30 day or 90 day supply? 90 days

## 2021-07-02 NOTE — Telephone Encounter (Signed)
Pt calling requesting a refill on pantoprazole 40 mg tablets. Would Dr. Marlou Porch like to refill this medication? Please address

## 2021-07-03 NOTE — Telephone Encounter (Signed)
Please obtain from PCP Dr Inda Merlin - thank you

## 2021-07-03 NOTE — Telephone Encounter (Signed)
Called pt to inform him that Dr. Marlou Porch would like for him to contact his PCP for refills for his pantoprazole 40 mg tablet. I advise pt that if he has any other problems, questions or concerns, to give our office call back. Pt verbalized understanding.

## 2021-07-06 DIAGNOSIS — I4891 Unspecified atrial fibrillation: Secondary | ICD-10-CM | POA: Diagnosis not present

## 2021-07-06 DIAGNOSIS — R7303 Prediabetes: Secondary | ICD-10-CM | POA: Diagnosis not present

## 2021-07-06 DIAGNOSIS — E785 Hyperlipidemia, unspecified: Secondary | ICD-10-CM | POA: Diagnosis not present

## 2021-07-06 DIAGNOSIS — Z792 Long term (current) use of antibiotics: Secondary | ICD-10-CM | POA: Diagnosis not present

## 2021-07-06 DIAGNOSIS — I712 Thoracic aortic aneurysm, without rupture, unspecified: Secondary | ICD-10-CM | POA: Diagnosis not present

## 2021-07-06 DIAGNOSIS — D62 Acute posthemorrhagic anemia: Secondary | ICD-10-CM | POA: Diagnosis not present

## 2021-07-06 DIAGNOSIS — E46 Unspecified protein-calorie malnutrition: Secondary | ICD-10-CM | POA: Diagnosis not present

## 2021-07-06 DIAGNOSIS — Z96641 Presence of right artificial hip joint: Secondary | ICD-10-CM | POA: Diagnosis not present

## 2021-07-06 DIAGNOSIS — Z48815 Encounter for surgical aftercare following surgery on the digestive system: Secondary | ICD-10-CM | POA: Diagnosis not present

## 2021-07-06 DIAGNOSIS — I700 Atherosclerosis of aorta: Secondary | ICD-10-CM | POA: Diagnosis not present

## 2021-07-06 DIAGNOSIS — Z9089 Acquired absence of other organs: Secondary | ICD-10-CM | POA: Diagnosis not present

## 2021-07-06 DIAGNOSIS — K5900 Constipation, unspecified: Secondary | ICD-10-CM | POA: Diagnosis not present

## 2021-07-06 DIAGNOSIS — K651 Peritoneal abscess: Secondary | ICD-10-CM | POA: Diagnosis not present

## 2021-07-16 DIAGNOSIS — E46 Unspecified protein-calorie malnutrition: Secondary | ICD-10-CM | POA: Diagnosis not present

## 2021-07-16 DIAGNOSIS — E785 Hyperlipidemia, unspecified: Secondary | ICD-10-CM | POA: Diagnosis not present

## 2021-07-16 DIAGNOSIS — E559 Vitamin D deficiency, unspecified: Secondary | ICD-10-CM | POA: Diagnosis not present

## 2021-07-16 DIAGNOSIS — D6869 Other thrombophilia: Secondary | ICD-10-CM | POA: Diagnosis not present

## 2021-07-16 DIAGNOSIS — I712 Thoracic aortic aneurysm, without rupture, unspecified: Secondary | ICD-10-CM | POA: Diagnosis not present

## 2021-07-16 DIAGNOSIS — I4891 Unspecified atrial fibrillation: Secondary | ICD-10-CM | POA: Diagnosis not present

## 2021-07-16 DIAGNOSIS — Z7901 Long term (current) use of anticoagulants: Secondary | ICD-10-CM | POA: Diagnosis not present

## 2021-07-16 DIAGNOSIS — K5900 Constipation, unspecified: Secondary | ICD-10-CM | POA: Diagnosis not present

## 2021-07-16 DIAGNOSIS — R7309 Other abnormal glucose: Secondary | ICD-10-CM | POA: Diagnosis not present

## 2021-07-18 DIAGNOSIS — Z23 Encounter for immunization: Secondary | ICD-10-CM | POA: Diagnosis not present

## 2021-07-21 DIAGNOSIS — K651 Peritoneal abscess: Secondary | ICD-10-CM | POA: Diagnosis not present

## 2021-07-21 DIAGNOSIS — D62 Acute posthemorrhagic anemia: Secondary | ICD-10-CM | POA: Diagnosis not present

## 2021-07-21 DIAGNOSIS — Z48815 Encounter for surgical aftercare following surgery on the digestive system: Secondary | ICD-10-CM | POA: Diagnosis not present

## 2021-07-21 DIAGNOSIS — R7303 Prediabetes: Secondary | ICD-10-CM | POA: Diagnosis not present

## 2021-07-21 DIAGNOSIS — I4891 Unspecified atrial fibrillation: Secondary | ICD-10-CM | POA: Diagnosis not present

## 2021-07-21 DIAGNOSIS — K5900 Constipation, unspecified: Secondary | ICD-10-CM | POA: Diagnosis not present

## 2021-07-31 DIAGNOSIS — C44619 Basal cell carcinoma of skin of left upper limb, including shoulder: Secondary | ICD-10-CM | POA: Diagnosis not present

## 2021-07-31 DIAGNOSIS — Z85828 Personal history of other malignant neoplasm of skin: Secondary | ICD-10-CM | POA: Diagnosis not present

## 2021-08-11 DIAGNOSIS — H353132 Nonexudative age-related macular degeneration, bilateral, intermediate dry stage: Secondary | ICD-10-CM | POA: Diagnosis not present

## 2021-08-11 DIAGNOSIS — H35371 Puckering of macula, right eye: Secondary | ICD-10-CM | POA: Diagnosis not present

## 2021-08-11 DIAGNOSIS — H18513 Endothelial corneal dystrophy, bilateral: Secondary | ICD-10-CM | POA: Diagnosis not present

## 2021-08-11 DIAGNOSIS — Z961 Presence of intraocular lens: Secondary | ICD-10-CM | POA: Diagnosis not present

## 2021-08-14 DIAGNOSIS — C44619 Basal cell carcinoma of skin of left upper limb, including shoulder: Secondary | ICD-10-CM | POA: Diagnosis not present

## 2021-08-14 DIAGNOSIS — Z85828 Personal history of other malignant neoplasm of skin: Secondary | ICD-10-CM | POA: Diagnosis not present

## 2021-08-28 DIAGNOSIS — D485 Neoplasm of uncertain behavior of skin: Secondary | ICD-10-CM | POA: Diagnosis not present

## 2021-08-28 DIAGNOSIS — C44319 Basal cell carcinoma of skin of other parts of face: Secondary | ICD-10-CM | POA: Diagnosis not present

## 2021-10-13 DIAGNOSIS — Z85828 Personal history of other malignant neoplasm of skin: Secondary | ICD-10-CM | POA: Diagnosis not present

## 2021-10-13 DIAGNOSIS — C44319 Basal cell carcinoma of skin of other parts of face: Secondary | ICD-10-CM | POA: Diagnosis not present

## 2021-10-16 DIAGNOSIS — D6869 Other thrombophilia: Secondary | ICD-10-CM | POA: Diagnosis not present

## 2021-10-16 DIAGNOSIS — E559 Vitamin D deficiency, unspecified: Secondary | ICD-10-CM | POA: Diagnosis not present

## 2021-10-16 DIAGNOSIS — I712 Thoracic aortic aneurysm, without rupture, unspecified: Secondary | ICD-10-CM | POA: Diagnosis not present

## 2021-10-16 DIAGNOSIS — E46 Unspecified protein-calorie malnutrition: Secondary | ICD-10-CM | POA: Diagnosis not present

## 2021-10-16 DIAGNOSIS — I4891 Unspecified atrial fibrillation: Secondary | ICD-10-CM | POA: Diagnosis not present

## 2021-10-16 DIAGNOSIS — Z7901 Long term (current) use of anticoagulants: Secondary | ICD-10-CM | POA: Diagnosis not present

## 2021-10-16 DIAGNOSIS — K5900 Constipation, unspecified: Secondary | ICD-10-CM | POA: Diagnosis not present

## 2021-10-16 DIAGNOSIS — E785 Hyperlipidemia, unspecified: Secondary | ICD-10-CM | POA: Diagnosis not present

## 2021-10-23 ENCOUNTER — Ambulatory Visit (INDEPENDENT_AMBULATORY_CARE_PROVIDER_SITE_OTHER): Payer: Medicare Other | Admitting: Cardiology

## 2021-10-23 ENCOUNTER — Other Ambulatory Visit: Payer: Self-pay

## 2021-10-23 ENCOUNTER — Encounter: Payer: Self-pay | Admitting: Cardiology

## 2021-10-23 VITALS — BP 100/60 | HR 76 | Ht 71.0 in | Wt 162.0 lb

## 2021-10-23 DIAGNOSIS — I4891 Unspecified atrial fibrillation: Secondary | ICD-10-CM

## 2021-10-23 DIAGNOSIS — Z01812 Encounter for preprocedural laboratory examination: Secondary | ICD-10-CM | POA: Diagnosis not present

## 2021-10-23 DIAGNOSIS — I7121 Aneurysm of the ascending aorta, without rupture: Secondary | ICD-10-CM | POA: Diagnosis not present

## 2021-10-23 DIAGNOSIS — E785 Hyperlipidemia, unspecified: Secondary | ICD-10-CM

## 2021-10-23 NOTE — Assessment & Plan Note (Addendum)
4.8 cm ascending thoracic aortic aneurysm noted on  CT scan 05/02/2021.  Repeat CT scan in 1 year.  We will see him after this.  Avoid heavy straining, Valsalva type maneuver.  We discussed surgical threshold for repair at 5.5 cm.  Continue with surveillance.  Obviously if severe chest pain were to occur, call 911.  Continue with optimal blood pressure control

## 2021-10-23 NOTE — Assessment & Plan Note (Signed)
On atorvastatin 10 mg a day.  No myalgias.

## 2021-10-23 NOTE — Patient Instructions (Signed)
Medication Instructions:  The current medical regimen is effective;  continue present plan and medications.  *If you need a refill on your cardiac medications before your next appointment, please call your pharmacy*  Lab Work: None today.  You will need blood work before your CT in August.  If you have labs (blood work) drawn today and your tests are completely normal, you will receive your results only by: [place] (if you have MyChart) OR A paper copy in the mail If you have any lab test that is abnormal or we need to change your treatment, we will call you to review the results.   Testing/Procedures: You have been ordered to have CT Angiography (CTA) of the chest in August, 2023, is a special type of CT scan that uses a computer to produce multi-dimensional views of major blood vessels throughout the body. In CT angiography, a contrast material is injected through an IV to help visualize the blood vessels  Follow-Up: At [facility], you and your health needs are our priority.  As part of our continuing mission to provide you with exceptional heart care, we have created designated Provider Care Teams.  These Care Teams include your primary Cardiologist (physician) and Advanced Practice Providers (APPs -  Physician Assistants and Nurse Practitioners) who all work together to provide you with the care you need, when you need it.  We recommend signing up for the patient portal called "MyChart".  Sign up information is provided on this After Visit Summary.  MyChart is used to connect with patients for Virtual Visits (Telemedicine).  Patients are able to view lab/test results, encounter notes, upcoming appointments, etc.  Non-urgent messages can be sent to your provider as well.   To learn more about what you can do with MyChart, go to [email or web address].    Your next appointment:   8 month(s)  (after CTA-chest)  The format for your next appointment:   In Person  Provider:    Candee Furbish, MD     Thank you for choosing [facility]!!

## 2021-10-23 NOTE — Assessment & Plan Note (Signed)
Postop atrial relation.  Placed on Eliquis 5 mg twice a day.  Currently controlled on diltiazem as needed 30 mg.  Paroxysmal.  Ejection fraction 65%.

## 2021-10-23 NOTE — Progress Notes (Signed)
Cardiology Office Note:    Date:  10/23/2021   ID:  Gary Rice [place], DOB [birth year], MRN [id number]  PCP:  Josetta Huddle, MD  Cardiologist:  Candee Furbish, MD  Electrophysiologist:  None   Referring MD: Josetta Huddle, MD    History of Present Illness:    Gary Rice is a 88 y.o. male here for the follow-up of post op atrial fibrillation and hyperlipidemia.  He followed up with Ermalinda Barrios, PA-C 06/25/2021 and reported no recurrence of palpitations.   Reviewed his hospitalization 05/02/21. He developed bowel obstruction s/p right inguinal hernia repair with mesh placement 05/31/8100 complicated by bowel perforation. He developed infection with a large pelvic abscess, s/p drainage and followed by ID. Also he developed new onset postop atrial fibrillation and was started on Eliquis. Echo 05/04/2021 showed LVEF 60-65% with trivial MR and ascending aortic aneurysm 4.8 cm. He was readmitted 06/10/2021 with severe constipation and ileus. Eliquis was continued and diltiazem prn was added.  Had a atrial tachycardia heart rate [place].  Mild fluttering.  No anginal symptoms.  2 prior episodes to this.  He was at a [employer] for his son, anxious, heart rate was 145.  At his last appointment he was doing well.  Gary Rice is his wife.  Today: Overall, he appears well. He continues to recover. He has a raspy voice and suffers from sputum production that he attributes to his previous hospitalizations.  We reviewed at length the results of his CTA chest and echo 04/2021, and discussed potential risks and implications concerning his ascending aortic aneurysm (4.8 cm) that was found.  He denies any palpitations, chest pain, or shortness of breath. No lightheadedness, headaches, syncope, orthopnea, PND, lower extremity edema or exertional symptoms.   Past Medical History:  Diagnosis Date   Astigmatism    Basal cell carcinoma    Inguinal hernia    bilateral   Macular degeneration, age  related, nonexudative    bilateral   SVT (supraventricular tachycardia) ([place])     Past Surgical History:  Procedure Laterality Date   APPENDECTOMY     age 31   BOWEL RESECTION  05/09/2021   Procedure: SMALL BOWEL RESECTION;  Surgeon: Rolm Bookbinder, MD;  Location: [facility];  Service: General;;   CATARACT EXTRACTION, BILATERAL     HERNIA REPAIR     RIH   INGUINAL HERNIA REPAIR Right 05/02/2021   Procedure: REPAIR  INCARCERATED INGUINAL HERNIA  WITH MESH;  Surgeon: Jesusita Oka, MD;  Location: [facility];  Service: General;  Laterality: Right;   JOINT REPLACEMENT Right    RTHA   LAPAROSCOPY N/A 05/09/2021   Procedure: LAPAROSCOPY DIAGNOSTIC;  Surgeon: Rolm Bookbinder, MD;  Location: [facility];  Service: General;  Laterality: N/A;   LAPAROTOMY  05/09/2021   Procedure: EXPLORATORY LAPAROTOMY;  Surgeon: Rolm Bookbinder, MD;  Location: [facility];  Service: General;;   TONSILLECTOMY      Current Medications: Current Meds  Medication Sig   apixaban (ELIQUIS) 5 MG TABS tablet Take 1 tablet (5 mg total) by mouth 2 (two) times daily.   atorvastatin (LIPITOR) 10 MG tablet Take 10 mg by mouth daily.   Cholecalciferol (VITAMIN D) 50 MCG (2000 UT) CAPS Take 50 mcg by mouth daily.   diltiazem (CARDIZEM) 30 MG tablet Take 30 mg by mouth daily as needed (blood pressure, chest pain).   docusate sodium (COLACE) 100 MG capsule Take 1 capsule (100 mg total) by mouth 2 (two) times daily. Surfactant  metroNIDAZOLE (FLAGYL) 500 MG tablet Take 500 mg by mouth 3 (three) times daily.   Multiple Vitamin (MULTIVITAMIN WITH MINERALS) TABS tablet Take 1 tablet by mouth daily.   Multiple Vitamins-Minerals (PRESERVISION AREDS 2) CAPS Take 1 capsule 2 (two) times daily by mouth.   pantoprazole (PROTONIX) 40 MG tablet Take 1 tablet (40 mg total) by mouth daily.   polyethylene glycol (MIRALAX / GLYCOLAX) 17 g packet Take 17 g by mouth daily. Stool softener   sulfamethoxazole-trimethoprim (BACTRIM DS) 800-160 MG tablet  Take 1 tablet by mouth 2 (two) times daily.   VITAMIN E PO Take 1 capsule by mouth daily.      Allergies:   Patient has no known allergies.   Social History   Socioeconomic History   Marital status: Married    Spouse name: Not on file   Number of children: Not on file   Years of education: Not on file   Highest education level: Not on file  Occupational History   Not on file  Tobacco Use   Smoking status: Former    Types: Cigarettes    Quit date: 10/24/1950    Years since quitting: 71.0   Smokeless tobacco: Never  Vaping Use   Vaping Use: Never used  Substance and Sexual Activity   Alcohol use: No   Drug use: No   Sexual activity: Not on file  Other Topics Concern   Not on file  Social History Narrative   Not on file   Social Determinants of Health   Financial Resource Strain: Not on file  Food Insecurity: Not on file  Transportation Needs: Not on file  Physical Activity: Not on file  Stress: Not on file  Social Connections: Not on file     Family History: The patient's family history includes Heart attack in his father and son; Heart disease in his father; Sleep apnea in his son.  ROS:   Please see the history of present illness.  All other systems are reviewed and negative.    EKGs/Labs/Other Studies Reviewed:    The following studies were reviewed today:  Echo 05/04/21: Sonographer Comments: Technically difficult study due to poor echo  windows.  IMPRESSIONS    1. Left ventricular ejection fraction, by estimation, is 60 to 65%. The  left ventricle has normal function. The left ventricle has no regional  wall motion abnormalities. There is mild left ventricular hypertrophy.  Left ventricular diastolic parameters  are indeterminate.   2. Right ventricular systolic function is normal. The right ventricular  size is normal. Tricuspid regurgitation signal is inadequate for assessing  PA pressure.   3. The mitral valve is degenerative. Trivial mitral  valve regurgitation.  No evidence of mitral stenosis.   4. The aortic valve is grossly normal. There is mild thickening of the  aortic valve. Aortic valve regurgitation is trivial. No aortic stenosis is  present.   5. The inferior vena cava is dilated in size with >50% respiratory  variability, suggesting right atrial pressure of 8 mmHg.   CTA Chest 05/02/2021: FINDINGS: CTA CHEST FINDINGS   Cardiovascular: Satisfactory opacification of the pulmonary arteries to the segmental level. No evidence of pulmonary embolism. Mild aortic atherosclerosis. Aneurysmal dilatation of the ascending aorta up to 4.8 cm. Normal cardiac size. Mild coronary vascular calcification. No pericardial effusion   Mediastinum/Nodes: No enlarged mediastinal, hilar, or axillary lymph nodes. Thyroid gland, trachea, and esophagus demonstrate no significant findings.   Lungs/Pleura: Lungs are clear. No pleural effusion or pneumothorax.  Musculoskeletal: No chest wall abnormality. No acute or significant osseous findings.   Review of the MIP images confirms the above findings.   CT ABDOMEN and PELVIS FINDINGS   Hepatobiliary: Hyperdense sludge or excreted contrast within the gallbladder. No biliary dilatation. Subcentimeter hypodensities in the liver too small to further characterize. Small cyst in the left hepatic lobe.   Pancreas: Unremarkable. No pancreatic ductal dilatation or surrounding inflammatory changes.   Spleen: Normal in size without focal abnormality.   Adrenals/Urinary Tract: Adrenal glands are normal. Bilateral renal cysts. 9.4 cm cyst in the upper pole left kidney. The bladder is unremarkable   Stomach/Bowel: Fluid distension of the stomach, proximal and mid small bowel consistent with obstruction. Transition point related to right inguinal hernia containing fat and short segment of small bowel. Small bowel distal to the hernia is decompressed. No acute bowel wall thickening.    Vascular/Lymphatic: Moderate aortic atherosclerosis. No aneurysm. No suspicious nodes   Reproductive: Enlarged prostate   Other: Moderate left inguinal hernia containing fat and decompressed small bowel.   Musculoskeletal: Orthopedic hardware right hip with artifact. No acute osseous abnormality   Review of the MIP images confirms the above findings.   IMPRESSION: 1. Negative for acute pulmonary embolus.  Clear lung fields. 2. Findings consistent with mechanical small bowel obstruction, transition point related to right inguinal hernia. No perforation. 3. Moderate fat and small bowel containing left inguinal hernia without adverse features 4. Aneurysmal dilatation of the ascending aorta up to 4.8 cm. Ascending thoracic aortic aneurysm. Recommend semi-annual imaging followup by CTA or MRA and referral to cardiothoracic surgery if not already obtained. This recommendation follows 2010 ACCF/AHA/AATS/ACR/ASA/SCA/SCAI/SIR/STS/SVM Guidelines for the Diagnosis and Management of Patients With Thoracic Aortic Disease. Circulation. 2010; 121: R443-X540. Aortic aneurysm NOS (ICD10-I71.9)  EKG:  EKG is personally reviewed and interpreted. 10/23/2021: EKG was not ordered.  10/18/2018: normal sinus rhythm 78 early transition R waves.  Overall no significant change from prior.  Recent Labs: 05/03/2021: TSH 2.342 06/08/2021: Magnesium 1.9 06/10/2021: ALT 21; BUN 12; Creatinine, Ser 0.83; Hemoglobin 11.6; Platelets 330; Potassium 4.2; Sodium 133   Recent Lipid Panel    Component Value Date/Time   TRIG 86 05/12/2021 0329    Physical Exam:    VS:  BP 100/60 (BP Location: Left Arm, Patient Position: Sitting, Cuff Size: Normal)    Pulse 76    Ht 5\' 11"  (1.803 m)    Wt 162 lb (73.5 kg)    SpO2 96%    BMI 22.59 kg/m     Wt Readings from Last 3 Encounters:  10/23/21 162 lb (73.5 kg)  06/25/21 161 lb (73 kg)  06/18/21 161 lb (73 kg)     GEN:  Well nourished, well developed in no acute  distress HEENT: Normal NECK: No JVD; No carotid bruits LYMPHATICS: No lymphadenopathy CARDIAC: RRR, no murmurs, rubs, gallops RESPIRATORY:  Clear to auscultation without rales, wheezing or rhonchi  ABDOMEN: Soft, non-tender, non-distended MUSCULOSKELETAL:  No edema; No deformity  SKIN: Warm and dry NEUROLOGIC:  Alert and oriented x 3 PSYCHIATRIC:  Normal affect   ASSESSMENT:    1. Pre-procedure lab exam   2. Atrial fibrillation with rapid ventricular response ([place])   3. Aneurysm of ascending aorta without rupture   4. Hyperlipidemia, unspecified hyperlipidemia type     PLAN:    In order of problems listed above:  Atrial fibrillation with rapid ventricular response ([facility]) Postop atrial relation.  Placed on Eliquis 5 mg twice a day.  Currently controlled  on diltiazem as needed 30 mg.  Paroxysmal.  Ejection fraction 65%.  Thoracic aortic aneurysm 4.8 cm ascending thoracic aortic aneurysm noted on  CT scan 05/02/2021.  Repeat CT scan in 1 year.  We will see him after this.  Avoid heavy straining, Valsalva type maneuver.  We discussed surgical threshold for repair at 5.5 cm.  Continue with surveillance.  Obviously if severe chest pain were to occur, call 911.  Continue with optimal blood pressure control  Hyperlipidemia On atorvastatin 10 mg a day.  No myalgias.   Supraventricular tachycardia - Possibly 3 separate episodes, main one was the day of memorial for son.  Flutter-like sensation.  Can take diltiazem as needed.  EP if necessary. Relatively asymptomatic. SVT resolved in ambulance. Not aware of any other overall he is been doing quite well.  No recurrent episodes.  Follow-up: We will see him following a repeat Echo.  We will continue to follow his wife Gary Rice.   Medication Adjustments/Labs and Tests Ordered: Current medicines are reviewed at length with the patient today.  Concerns regarding medicines are outlined above.   Orders Placed This Encounter  Procedures   CT ANGIO  CHEST AORTA W/CM & OR WO/CM   Basic metabolic panel   No orders of the defined types were placed in this encounter.  Patient Instructions  Medication Instructions:  The current medical regimen is effective;  continue present plan and medications.  *If you need a refill on your cardiac medications before your next appointment, please call your pharmacy*  Lab Work: None today.  You will need blood work before your CT in August.  If you have labs (blood work) drawn today and your tests are completely normal, you will receive your results only by: [place] (if you have MyChart) OR A paper copy in the mail If you have any lab test that is abnormal or we need to change your treatment, we will call you to review the results.   Testing/Procedures: You have been ordered to have CT Angiography (CTA) of the chest in August, 2023, is a special type of CT scan that uses a computer to produce multi-dimensional views of major blood vessels throughout the body. In CT angiography, a contrast material is injected through an IV to help visualize the blood vessels  Follow-Up: At [facility], you and your health needs are our priority.  As part of our continuing mission to provide you with exceptional heart care, we have created designated Provider Care Teams.  These Care Teams include your primary Cardiologist (physician) and Advanced Practice Providers (APPs -  Physician Assistants and Nurse Practitioners) who all work together to provide you with the care you need, when you need it.  We recommend signing up for the patient portal called "MyChart".  Sign up information is provided on this After Visit Summary.  MyChart is used to connect with patients for Virtual Visits (Telemedicine).  Patients are able to view lab/test results, encounter notes, upcoming appointments, etc.  Non-urgent messages can be sent to your provider as well.   To learn more about what you can do with MyChart, go to  [email or web address].    Your next appointment:   8 month(s)  (after CTA-chest)  The format for your next appointment:   In Person  Provider:   Candee Furbish, MD     Thank you for choosing [facility]!!      I,Mathew Stumpf,acting as a scribe for Candee Furbish, MD.,have documented all relevant documentation on  the behalf of Candee Furbish, MD,as directed by  Candee Furbish, MD while in the presence of Candee Furbish, MD.  PLAN:   Signed, Candee Furbish, MD  10/23/2021 1:15 PM    [place]

## 2021-10-24 DIAGNOSIS — I4891 Unspecified atrial fibrillation: Secondary | ICD-10-CM | POA: Diagnosis not present

## 2021-10-24 DIAGNOSIS — E7800 Pure hypercholesterolemia, unspecified: Secondary | ICD-10-CM | POA: Diagnosis not present

## 2021-10-24 DIAGNOSIS — E559 Vitamin D deficiency, unspecified: Secondary | ICD-10-CM | POA: Diagnosis not present

## 2021-10-24 DIAGNOSIS — M1990 Unspecified osteoarthritis, unspecified site: Secondary | ICD-10-CM | POA: Diagnosis not present

## 2021-10-24 DIAGNOSIS — K5900 Constipation, unspecified: Secondary | ICD-10-CM | POA: Diagnosis not present

## 2021-10-27 DIAGNOSIS — Z23 Encounter for immunization: Secondary | ICD-10-CM | POA: Diagnosis not present

## 2021-12-01 DIAGNOSIS — L821 Other seborrheic keratosis: Secondary | ICD-10-CM | POA: Diagnosis not present

## 2021-12-01 DIAGNOSIS — C44519 Basal cell carcinoma of skin of other part of trunk: Secondary | ICD-10-CM | POA: Diagnosis not present

## 2021-12-01 DIAGNOSIS — D1722 Benign lipomatous neoplasm of skin and subcutaneous tissue of left arm: Secondary | ICD-10-CM | POA: Diagnosis not present

## 2021-12-01 DIAGNOSIS — Z85828 Personal history of other malignant neoplasm of skin: Secondary | ICD-10-CM | POA: Diagnosis not present

## 2021-12-01 DIAGNOSIS — L309 Dermatitis, unspecified: Secondary | ICD-10-CM | POA: Diagnosis not present

## 2021-12-01 DIAGNOSIS — L570 Actinic keratosis: Secondary | ICD-10-CM | POA: Diagnosis not present

## 2021-12-30 DIAGNOSIS — C44519 Basal cell carcinoma of skin of other part of trunk: Secondary | ICD-10-CM | POA: Diagnosis not present

## 2022-01-13 DIAGNOSIS — C44519 Basal cell carcinoma of skin of other part of trunk: Secondary | ICD-10-CM | POA: Diagnosis not present

## 2022-02-09 DIAGNOSIS — H5203 Hypermetropia, bilateral: Secondary | ICD-10-CM | POA: Diagnosis not present

## 2022-02-09 DIAGNOSIS — H353132 Nonexudative age-related macular degeneration, bilateral, intermediate dry stage: Secondary | ICD-10-CM | POA: Diagnosis not present

## 2022-02-09 DIAGNOSIS — H18513 Endothelial corneal dystrophy, bilateral: Secondary | ICD-10-CM | POA: Diagnosis not present

## 2022-02-09 DIAGNOSIS — H35373 Puckering of macula, bilateral: Secondary | ICD-10-CM | POA: Diagnosis not present

## 2022-02-23 ENCOUNTER — Observation Stay (HOSPITAL_COMMUNITY): Payer: Medicare Other

## 2022-02-23 ENCOUNTER — Emergency Department (HOSPITAL_COMMUNITY): Payer: Medicare Other

## 2022-02-23 ENCOUNTER — Inpatient Hospital Stay (HOSPITAL_COMMUNITY)
Admission: EM | Admit: 2022-02-23 | Discharge: 2022-02-26 | DRG: 310 | Disposition: A | Discharge status: Home / Self Care | Payer: Medicare Other | Attending: Internal Medicine | Admitting: Internal Medicine

## 2022-02-23 ENCOUNTER — Encounter (HOSPITAL_COMMUNITY): Payer: Self-pay | Admitting: Emergency Medicine

## 2022-02-23 ENCOUNTER — Other Ambulatory Visit: Payer: Self-pay

## 2022-02-23 DIAGNOSIS — I480 Paroxysmal atrial fibrillation: Principal | ICD-10-CM | POA: Diagnosis present

## 2022-02-23 DIAGNOSIS — E785 Hyperlipidemia, unspecified: Secondary | ICD-10-CM | POA: Diagnosis present

## 2022-02-23 DIAGNOSIS — W1830XA Fall on same level, unspecified, initial encounter: Secondary | ICD-10-CM | POA: Diagnosis present

## 2022-02-23 DIAGNOSIS — I959 Hypotension, unspecified: Secondary | ICD-10-CM | POA: Diagnosis not present

## 2022-02-23 DIAGNOSIS — G319 Degenerative disease of nervous system, unspecified: Secondary | ICD-10-CM | POA: Diagnosis not present

## 2022-02-23 DIAGNOSIS — D72829 Elevated white blood cell count, unspecified: Secondary | ICD-10-CM | POA: Diagnosis not present

## 2022-02-23 DIAGNOSIS — S0990XA Unspecified injury of head, initial encounter: Secondary | ICD-10-CM | POA: Diagnosis not present

## 2022-02-23 DIAGNOSIS — Z96641 Presence of right artificial hip joint: Secondary | ICD-10-CM | POA: Diagnosis present

## 2022-02-23 DIAGNOSIS — M5030 Other cervical disc degeneration, unspecified cervical region: Secondary | ICD-10-CM | POA: Diagnosis present

## 2022-02-23 DIAGNOSIS — Z85828 Personal history of other malignant neoplasm of skin: Secondary | ICD-10-CM | POA: Diagnosis not present

## 2022-02-23 DIAGNOSIS — G9389 Other specified disorders of brain: Secondary | ICD-10-CM | POA: Diagnosis not present

## 2022-02-23 DIAGNOSIS — R55 Syncope and collapse: Secondary | ICD-10-CM | POA: Diagnosis not present

## 2022-02-23 DIAGNOSIS — S199XXA Unspecified injury of neck, initial encounter: Secondary | ICD-10-CM | POA: Diagnosis not present

## 2022-02-23 DIAGNOSIS — Z8249 Family history of ischemic heart disease and other diseases of the circulatory system: Secondary | ICD-10-CM | POA: Diagnosis not present

## 2022-02-23 DIAGNOSIS — I517 Cardiomegaly: Secondary | ICD-10-CM | POA: Diagnosis not present

## 2022-02-23 DIAGNOSIS — M4312 Spondylolisthesis, cervical region: Secondary | ICD-10-CM | POA: Diagnosis not present

## 2022-02-23 DIAGNOSIS — W19XXXA Unspecified fall, initial encounter: Secondary | ICD-10-CM | POA: Diagnosis not present

## 2022-02-23 DIAGNOSIS — Y92000 Kitchen of unspecified non-institutional (private) residence as  the place of occurrence of the external cause: Secondary | ICD-10-CM | POA: Diagnosis not present

## 2022-02-23 DIAGNOSIS — M25551 Pain in right hip: Secondary | ICD-10-CM | POA: Diagnosis present

## 2022-02-23 DIAGNOSIS — Z87891 Personal history of nicotine dependence: Secondary | ICD-10-CM

## 2022-02-23 DIAGNOSIS — Z79899 Other long term (current) drug therapy: Secondary | ICD-10-CM | POA: Diagnosis not present

## 2022-02-23 DIAGNOSIS — M47812 Spondylosis without myelopathy or radiculopathy, cervical region: Secondary | ICD-10-CM | POA: Diagnosis not present

## 2022-02-23 DIAGNOSIS — S0191XA Laceration without foreign body of unspecified part of head, initial encounter: Secondary | ICD-10-CM | POA: Diagnosis present

## 2022-02-23 DIAGNOSIS — Z7901 Long term (current) use of anticoagulants: Secondary | ICD-10-CM

## 2022-02-23 DIAGNOSIS — I6782 Cerebral ischemia: Secondary | ICD-10-CM | POA: Diagnosis not present

## 2022-02-23 DIAGNOSIS — Z043 Encounter for examination and observation following other accident: Secondary | ICD-10-CM | POA: Diagnosis not present

## 2022-02-23 LAB — CBC
HCT: 40.8 % (ref 39.0–52.0)
Hemoglobin: 13.1 g/dL (ref 13.0–17.0)
MCH: 30.8 pg (ref 26.0–34.0)
MCHC: 32.1 g/dL (ref 30.0–36.0)
MCV: 96 fL (ref 80.0–100.0)
Platelets: 279 10*3/uL (ref 150–400)
RBC: 4.25 MIL/uL (ref 4.22–5.81)
RDW: 13 % (ref 11.5–15.5)
WBC: 11.6 10*3/uL — ABNORMAL HIGH (ref 4.0–10.5)
nRBC: 0 % (ref 0.0–0.2)

## 2022-02-23 LAB — COMPREHENSIVE METABOLIC PANEL
ALT: 25 U/L (ref 0–44)
AST: 29 U/L (ref 15–41)
Albumin: 3.4 g/dL — ABNORMAL LOW (ref 3.5–5.0)
Alkaline Phosphatase: 60 U/L (ref 38–126)
Anion gap: 8 (ref 5–15)
BUN: 26 mg/dL — ABNORMAL HIGH (ref 8–23)
CO2: 24 mmol/L (ref 22–32)
Calcium: 9.2 mg/dL (ref 8.9–10.3)
Chloride: 107 mmol/L (ref 98–111)
Creatinine, Ser: 1.1 mg/dL (ref 0.61–1.24)
GFR, Estimated: >60 mL/min (ref >60)
Glucose, Bld: 118 mg/dL — ABNORMAL HIGH (ref 70–99)
Potassium: 4.6 mmol/L (ref 3.5–5.1)
Sodium: 139 mmol/L (ref 135–145)
Total Bilirubin: 0.8 mg/dL (ref 0.3–1.2)
Total Protein: 7.3 g/dL (ref 6.5–8.1)

## 2022-02-23 LAB — PHOSPHORUS: Phosphorus: 2.5 mg/dL (ref 2.5–4.6)

## 2022-02-23 LAB — URINALYSIS, ROUTINE W REFLEX MICROSCOPIC
Bilirubin Urine: NEGATIVE
Glucose, UA: NEGATIVE mg/dL
Hgb urine dipstick: NEGATIVE
Ketones, ur: 5 mg/dL — AB
Leukocytes,Ua: NEGATIVE
Nitrite: NEGATIVE
Protein, ur: NEGATIVE mg/dL
Specific Gravity, Urine: 1.015 (ref 1.005–1.030)
pH: 6 (ref 5.0–8.0)

## 2022-02-23 LAB — T4, FREE: Free T4: 1.17 ng/dL — ABNORMAL HIGH (ref 0.61–1.12)

## 2022-02-23 LAB — TROPONIN I (HIGH SENSITIVITY)
Troponin I (High Sensitivity): 9 ng/L (ref <18)
Troponin I (High Sensitivity): 9 ng/L (ref <18)

## 2022-02-23 LAB — MAGNESIUM: Magnesium: 2 mg/dL (ref 1.7–2.4)

## 2022-02-23 LAB — TSH: TSH: 4.342 u[IU]/mL (ref 0.350–4.500)

## 2022-02-23 MED ORDER — OCUVITE-LUTEIN PO CAPS
1.0000 | ORAL_CAPSULE | Freq: Two times a day (BID) | ORAL | Status: DC
  Filled 2022-02-23: qty 1

## 2022-02-23 MED ORDER — DOCUSATE SODIUM 100 MG PO CAPS
100.0000 mg | ORAL_CAPSULE | Freq: Two times a day (BID) | ORAL | Status: DC
  Administered 2022-02-23 – 2022-02-26 (×6): 100 mg via ORAL
  Filled 2022-02-23 (×7): qty 1

## 2022-02-23 MED ORDER — VITAMIN D3 25 MCG (1000 UNIT) PO TABS
2000.0000 [IU] | ORAL_TABLET | Freq: Every day | ORAL | Status: DC
  Administered 2022-02-24 – 2022-02-26 (×3): 2000 [IU] via ORAL
  Filled 2022-02-23 (×6): qty 2

## 2022-02-23 MED ORDER — ONDANSETRON HCL 4 MG PO TABS: 4.0000 mg | ORAL_TABLET | Freq: Four times a day (QID) | ORAL | Status: DC | PRN

## 2022-02-23 MED ORDER — APIXABAN 5 MG PO TABS
5.0000 mg | ORAL_TABLET | Freq: Two times a day (BID) | ORAL | Status: DC
  Administered 2022-02-23 – 2022-02-26 (×6): 5 mg via ORAL
  Filled 2022-02-23 (×6): qty 1

## 2022-02-23 MED ORDER — SODIUM CHLORIDE 0.9 % IV BOLUS
1000.0000 mL | Freq: Once | INTRAVENOUS | Status: AC
  Administered 2022-02-23: 1000 mL via INTRAVENOUS

## 2022-02-23 MED ORDER — ATORVASTATIN CALCIUM 10 MG PO TABS
10.0000 mg | ORAL_TABLET | Freq: Every day | ORAL | Status: DC
Start: 2022-02-23 — End: 2022-02-26
  Administered 2022-02-23 – 2022-02-25 (×3): 10 mg via ORAL
  Filled 2022-02-23 (×3): qty 1

## 2022-02-23 MED ORDER — SODIUM CHLORIDE 0.9% FLUSH
3.0000 mL | Freq: Two times a day (BID) | INTRAVENOUS | Status: DC
  Administered 2022-02-23 – 2022-02-26 (×7): 3 mL via INTRAVENOUS

## 2022-02-23 MED ORDER — ACETAMINOPHEN 325 MG PO TABS
650.0000 mg | ORAL_TABLET | Freq: Four times a day (QID) | ORAL | Status: DC | PRN
  Administered 2022-02-23 – 2022-02-26 (×6): 650 mg via ORAL
  Filled 2022-02-23 (×6): qty 2

## 2022-02-23 MED ORDER — ADULT MULTIVITAMIN W/MINERALS CH
1.0000 | ORAL_TABLET | Freq: Every day | ORAL | Status: DC
Start: 2022-02-24 — End: 2022-02-26
  Administered 2022-02-24 – 2022-02-26 (×3): 1 via ORAL
  Filled 2022-02-23 (×3): qty 1

## 2022-02-23 MED ORDER — ONDANSETRON HCL 4 MG/2ML IJ SOLN: 4.0000 mg | Freq: Four times a day (QID) | INTRAMUSCULAR | Status: DC | PRN

## 2022-02-23 MED ORDER — ACETAMINOPHEN 650 MG RE SUPP: 650.0000 mg | Freq: Four times a day (QID) | RECTAL | Status: DC | PRN

## 2022-02-23 MED ORDER — PROSIGHT PO TABS
1.0000 | ORAL_TABLET | Freq: Every day | ORAL | Status: DC
  Administered 2022-02-23 – 2022-02-26 (×4): 1 via ORAL
  Filled 2022-02-23 (×4): qty 1

## 2022-02-23 MED ORDER — DILTIAZEM HCL 30 MG PO TABS: 30.0000 mg | ORAL_TABLET | Freq: Every day | ORAL | Status: DC | PRN

## 2022-02-23 NOTE — ED Triage Notes (Signed)
Pt BIB [facility] from home due to having a syncopal episode in kitchen this morning.  When this occurred he fell backwards and hit the back of his head.  Initially he did not have a pulse and BP was 50/30.  12 lead EKG showed elevation in the anterior.  332m fluid bolus administered.  VS BP 120/70, 94% SpO2, CBG 138.  Pt states that he is on Eloquis but has not taken his daily dose today.

## 2022-02-23 NOTE — ED Notes (Signed)
Pt transported to CT ?

## 2022-02-23 NOTE — ED Provider Notes (Signed)
[facility] EMERGENCY DEPARTMENT Provider Note   CSN: [id number] Arrival date & time: 02/23/22  1105     History Chief Complaint  Patient presents with   Fall   Level 2- [place] is a 88 y.o. male with history of SVT, atrial fibrillation on Eliquis, hyperlipidemia who presents to the emergency department with a head injury secondary to a fall that occurred just prior to arrival.  Patient was in the kitchen when he began feeling dizzy and lightheaded and passed out.  When the patient lost consciousness, he fell backwards and struck his head on the ground.  Patient currently denies any symptoms.  He denies any chest pain or shortness of breath prior to the fall.  Denies abdominal pain, hip pain, knee pain.  HPI     Home Medications Prior to Admission medications   Medication Sig Start Date End Date Taking? Authorizing Provider  apixaban (ELIQUIS) 5 MG TABS tablet Take 1 tablet (5 mg total) by mouth 2 (two) times daily. 06/25/21   Jerline Pain, MD  atorvastatin (LIPITOR) 10 MG tablet Take 10 mg by mouth daily. 08/14/21   [provider]  Cholecalciferol (VITAMIN D) 50 MCG (2000 UT) CAPS Take 50 mcg by mouth daily.    [provider]  diltiazem (CARDIZEM) 30 MG tablet Take 30 mg by mouth daily as needed (blood pressure, chest pain).    [provider]  docusate sodium (COLACE) 100 MG capsule Take 1 capsule (100 mg total) by mouth 2 (two) times daily. Surfactant 06/10/21   Lavina Hamman, MD  metroNIDAZOLE (FLAGYL) 500 MG tablet Take 500 mg by mouth 3 (three) times daily.    [provider]  Multiple Vitamin (MULTIVITAMIN WITH MINERALS) TABS tablet Take 1 tablet by mouth daily.    [provider]  Multiple Vitamins-Minerals (PRESERVISION AREDS 2) CAPS Take 1 capsule 2 (two) times daily by mouth.    [provider]  pantoprazole (PROTONIX) 40 MG tablet Take 1 tablet (40 mg total) by mouth  daily. 06/03/21   Angiulli, Lavon Paganini, PA-C  polyethylene glycol (MIRALAX / GLYCOLAX) 17 g packet Take 17 g by mouth daily. Stool softener 06/10/21   Lavina Hamman, MD  sulfamethoxazole-trimethoprim (BACTRIM DS) 800-160 MG tablet Take 1 tablet by mouth 2 (two) times daily.    [provider]  VITAMIN E PO Take 1 capsule by mouth daily.     [provider]      Allergies    Patient has no known allergies.    Review of Systems   Review of Systems  All other systems reviewed and are negative.  Physical Exam Updated Vital Signs BP 122/84   Pulse 87   Temp (!) 97.5 F (36.4 C) (Temporal)   Resp 19   Ht '5\' 11"'$  (1.803 m)   Wt 73.5 kg   SpO2 93%   BMI 22.60 kg/m  Physical Exam Vitals and nursing note reviewed.  Constitutional:      General: He is not in acute distress.    Appearance: Normal appearance.  HENT:     Head: Normocephalic.   Eyes:     General:        Right eye: No discharge.        Left eye: No discharge.  Neck:     Comments: Cervical spine is nontender to palpation. Cardiovascular:     Comments: Regular rate and rhythm.  S1/S2 are distinct without  any evidence of murmur, rubs, or gallops.  Radial pulses are 2+ bilaterally.  Dorsalis pedis pulses are 2+ bilaterally.  No evidence of pedal edema. Pulmonary:     Comments: Clear to auscultation bilaterally.  Normal effort.  No respiratory distress.  No evidence of wheezes, rales, or rhonchi heard throughout. Chest:     Comments: Chest wall is stable and nontender to palpation.  No evidence of flail chest or obvious step-offs. Abdominal:     General: Abdomen is flat. Bowel sounds are normal. There is no distension.     Tenderness: There is no abdominal tenderness. There is no guarding or rebound.  Musculoskeletal:        General: Normal range of motion.     Cervical back: Neck supple.     Comments: Pelvis is stable.  No obvious step-offs or deformities.  Skin:    General: Skin is warm and dry.      Findings: No rash.  Neurological:     General: No focal deficit present.     Mental Status: He is alert and oriented to person, place, and time.     GCS: GCS eye subscore is 4. GCS verbal subscore is 5. GCS motor subscore is 6.     Cranial Nerves: No cranial nerve deficit.     Sensory: No sensory deficit.     Motor: No weakness.  Psychiatric:        Mood and Affect: Mood normal.        Behavior: Behavior normal.    ED Results / Procedures / Treatments   Labs (all labs ordered are listed, but only abnormal results are displayed) Labs Reviewed  COMPREHENSIVE METABOLIC PANEL - Abnormal; Notable for the following components:      Result Value   Glucose, Bld 118 (*)    BUN 26 (*)    Albumin 3.4 (*)    All other components within normal limits  CBC - Abnormal; Notable for the following components:   WBC 11.6 (*)    All other components within normal limits  T4, FREE - Abnormal; Notable for the following components:   Free T4 1.17 (*)    All other components within normal limits  TSH  MAGNESIUM  PHOSPHORUS  URINALYSIS, ROUTINE W REFLEX MICROSCOPIC  TROPONIN I (HIGH SENSITIVITY)  TROPONIN I (HIGH SENSITIVITY)    EKG EKG Interpretation  Date/Time:  Monday Feb 23 2022 11:07:43 EDT Ventricular Rate:  67 PR Interval:  178 QRS Duration: 93 QT Interval:  419 QTC Calculation: 443 R Axis:   61 Text Interpretation: Sinus rhythm Abnormal R-wave progression, early transition Confirmed by Lennice Sites ([id number]) on 02/23/2022 11:21:52 AM  Radiology CT Head Wo Contrast  Result Date: 02/23/2022 CLINICAL DATA:  Moderate to severe fall with head and neck injury. Taking Eliquis. EXAM: CT HEAD WITHOUT CONTRAST CT CERVICAL SPINE WITHOUT CONTRAST TECHNIQUE: Multidetector CT imaging of the head and cervical spine was performed following the standard protocol without intravenous contrast. Multiplanar CT image reconstructions of the cervical spine were also generated. RADIATION DOSE REDUCTION: This  exam was performed according to the departmental dose-optimization program which includes automated exposure control, adjustment of the mA and/or kV according to patient size and/or use of iterative reconstruction technique. COMPARISON:  None Available. FINDINGS: CT HEAD FINDINGS Brain: Mildly enlarged ventricles and cortical sulci. Minimal patchy white matter low density in both cerebral hemispheres. Right basal ganglia probable prominent perivascular space. No intracranial hemorrhage, mass lesion or CT evidence of acute infarction. Vascular: No  hyperdense vessel or unexpected calcification. Skull: Normal. Negative for fracture or focal lesion. Sinuses/Orbits: Status post bilateral cataract extraction. Unremarkable bones and included paranasal sinuses. Other: None. CT CERVICAL SPINE FINDINGS Alignment: Grade 1 anterolisthesis at the C3-4 level. Grade 1 retrolisthesis at the C4-5 and C5-6 levels. Skull base and vertebrae: No acute fracture. No primary bone lesion or focal pathologic process. Soft tissues and spinal canal: No prevertebral fluid or swelling. No visible canal hematoma. Disc levels: Multilevel degenerative changes, including facet degenerative changes throughout the cervical spine. Upper chest: Clear lung apices. Other: None. IMPRESSION: 1. No skull fracture or intracranial hemorrhage. 2. No cervical spine fracture or traumatic subluxation. 3. Mild diffuse cerebral and cerebellar atrophy. 4. Minimal chronic small vessel white matter ischemic changes in both cerebral hemispheres. 5. Extensive cervical spine degenerative changes. Electronically Signed   By: Claudie Revering M.D.   On: 02/23/2022 12:27   CT Cervical Spine Wo Contrast  Result Date: 02/23/2022 CLINICAL DATA:  Moderate to severe fall with head and neck injury. Taking Eliquis. EXAM: CT HEAD WITHOUT CONTRAST CT CERVICAL SPINE WITHOUT CONTRAST TECHNIQUE: Multidetector CT imaging of the head and cervical spine was performed following the standard  protocol without intravenous contrast. Multiplanar CT image reconstructions of the cervical spine were also generated. RADIATION DOSE REDUCTION: This exam was performed according to the departmental dose-optimization program which includes automated exposure control, adjustment of the mA and/or kV according to patient size and/or use of iterative reconstruction technique. COMPARISON:  None Available. FINDINGS: CT HEAD FINDINGS Brain: Mildly enlarged ventricles and cortical sulci. Minimal patchy white matter low density in both cerebral hemispheres. Right basal ganglia probable prominent perivascular space. No intracranial hemorrhage, mass lesion or CT evidence of acute infarction. Vascular: No hyperdense vessel or unexpected calcification. Skull: Normal. Negative for fracture or focal lesion. Sinuses/Orbits: Status post bilateral cataract extraction. Unremarkable bones and included paranasal sinuses. Other: None. CT CERVICAL SPINE FINDINGS Alignment: Grade 1 anterolisthesis at the C3-4 level. Grade 1 retrolisthesis at the C4-5 and C5-6 levels. Skull base and vertebrae: No acute fracture. No primary bone lesion or focal pathologic process. Soft tissues and spinal canal: No prevertebral fluid or swelling. No visible canal hematoma. Disc levels: Multilevel degenerative changes, including facet degenerative changes throughout the cervical spine. Upper chest: Clear lung apices. Other: None. IMPRESSION: 1. No skull fracture or intracranial hemorrhage. 2. No cervical spine fracture or traumatic subluxation. 3. Mild diffuse cerebral and cerebellar atrophy. 4. Minimal chronic small vessel white matter ischemic changes in both cerebral hemispheres. 5. Extensive cervical spine degenerative changes. Electronically Signed   By: Claudie Revering M.D.   On: 02/23/2022 12:27   DG Chest Port 1 View  Result Date: 02/23/2022 CLINICAL DATA:  Fall. Patient on blood thinners. Patient reports losing consciousness. EXAM: PORTABLE CHEST 1  VIEW COMPARISON:  05/17/2021 FINDINGS: Cardiac silhouette is normal in size. No mediastinal or hilar masses. Clear lungs.  No pleural effusion or pneumothorax. Skeletal structures are demineralized, grossly intact. IMPRESSION: No active disease. Electronically Signed   By: Lajean Manes M.D.   On: 02/23/2022 11:21    Procedures Procedures   Medications Ordered in ED Medications  sodium chloride flush (NS) 0.9 % injection 3 mL (has no administration in time range)  acetaminophen (TYLENOL) tablet 650 mg (has no administration in time range)    Or  acetaminophen (TYLENOL) suppository 650 mg (has no administration in time range)  ondansetron (ZOFRAN) tablet 4 mg (has no administration in time range)    Or  ondansetron [facility]) injection 4 mg (has no administration in time range)  sodium chloride 0.9 % bolus 1,000 mL (1,000 mLs Intravenous New Bag/Given 02/23/22 1324)    ED Course/ Medical Decision Making/ A&P Clinical Course as of 02/23/22 1441  Mon Feb 23, 2022  1146 On further inspection of the wounds over the posterior scalp, there are 2 abrasions.  No obvious lacerations that I can see.  Bleeding is controlled.  There is an overlying contusion on the inferior abrasion. [CF]  0092 I called the wife the patient to provide an update. [CF]  1357 CBC(!) There is evidence of leukocytosis but otherwise no acute abnormalities. [CF]  1357 Comprehensive metabolic panel(!) Elevated glucose otherwise normal. [CF]  1358 Troponin I (High Sensitivity) Initial troponin was negative. [CF]  1358 TSH TSH is normal. [CF]  1358 T4, free(!) T4 is slightly elevated. [CF]  3300 I spoke with Dr. Doristine Bosworth with [facility] who agrees to admit the patient.  [CF]    Clinical Course User Index [CF] Hendricks Limes, PA-C                           Medical Decision Making Halvor Behrend is a 88 y.o. male who presents to the emergency department today for further evaluation of a head injury blood  thinners.  Patient arrives as a level 2 trauma.  Promptly saw the patient on arrival and did a full trauma assessment.  I have a low suspicion for any intrathoracic or intra-abdominal pathology.  I doubt any open book pelvis or unstable pelvic fractures.  Given the mechanism and head injury we will probably get CT head in addition to cervical spine imaging.  Patient is alert and oriented.  GCS 15.  We will continue to monitor.   Amount and/or Complexity of Data Reviewed Independent Historian: spouse    Details: Highlighted ED course External Data Reviewed: notes.    Details: Patient is anticoagulated with Eliquis secondary to atrial fibrillation. Labs: ordered. Decision-making details documented in ED Course. Radiology: ordered and independent interpretation performed.    Details: I personally ordered and interpreted imaging of the chest, head, and neck.  X-ray of the chest did not show any evidence of pneumothorax.  CT head and cervical spine were normal.  No evidence of intracranial hemorrhage or cervical fracture.  I do agree with radiologist to rotation.  Risk Parenteral controlled substances. Decision regarding hospitalization. Risk Details: Patient is orthostatics negative.  Patient still alert and oriented x3.  From a trauma standpoint patient is back at baseline.  Patient does not have any wounds that require suturing at this time.  However, given the clinical scenario I do feel the patient would likely benefit from further evaluation in the hospital for syncope work-up.  Patient will likely need telemetry and echocardiogram to further evaluate for this.  I will work on getting him admitted to the hospital service.   Final Clinical Impression(s) / ED Diagnoses Final diagnoses:  Fall, initial encounter  Syncope, unspecified syncope type  Injury of head, initial encounter    Rx / DC Orders ED Discharge Orders     None         Hendricks Limes, PA-C 02/23/22 1442     Lennice Sites, DO 02/23/22 1538

## 2022-02-23 NOTE — Evaluation (Signed)
Clinical/Bedside Swallow Evaluation Patient Details  Name: Gary Rice MRN: [id number] Date of Birth: [birth year]  Today's Date: 02/23/2022 Time: SLP Start Time (ACUTE ONLY): 1600 SLP Stop Time (ACUTE ONLY): 1615 SLP Time Calculation (Gary) (ACUTE ONLY): 15 Gary  Past Medical History:  Past Medical History:  Diagnosis Date   Astigmatism    Basal cell carcinoma    Inguinal hernia    bilateral   Macular degeneration, age related, nonexudative    bilateral   SVT (supraventricular tachycardia) ([place])    Past Surgical History:  Past Surgical History:  Procedure Laterality Date   APPENDECTOMY     age 75   BOWEL RESECTION  05/09/2021   Procedure: SMALL BOWEL RESECTION;  Surgeon: Rolm Bookbinder, MD;  Location: [facility];  Service: General;;   CATARACT EXTRACTION, BILATERAL     HERNIA REPAIR     RIH   INGUINAL HERNIA REPAIR Right 05/02/2021   Procedure: REPAIR  INCARCERATED INGUINAL HERNIA  WITH MESH;  Surgeon: Jesusita Oka, MD;  Location: [facility];  Service: General;  Laterality: Right;   JOINT REPLACEMENT Right    RTHA   LAPAROSCOPY N/A 05/09/2021   Procedure: LAPAROSCOPY DIAGNOSTIC;  Surgeon: Rolm Bookbinder, MD;  Location: [facility];  Service: General;  Laterality: N/A;   LAPAROTOMY  05/09/2021   Procedure: EXPLORATORY LAPAROTOMY;  Surgeon: Rolm Bookbinder, MD;  Location: [facility];  Service: General;;   TONSILLECTOMY     HPI:  Gary Rice is an 88 y.o. male who presented to the emergency department with a head injury secondary to a fall that occurred just prior to arrival. PMHx SVT, atrial fibrillation on Eliquis, hyperlipidemia. CT head negative for bleed/infarct; CT spine degenerative changes.    Assessment / Plan / Recommendation  Clinical Impression  Pt presents with normal swallow function with thorough mastication, brisk swallow response, and no s/s of aspiration.  Oral mechanism exam is normal. Resume regular diet, thin liquids. No SLP f/u needed. No dysphagia. SLP Visit  Diagnosis: Dysphagia, unspecified (R13.10)    Aspiration Risk  No limitations    Diet Recommendation   Regular solids, thin liquids  Medication Administration: Whole meds with liquid    Other  Recommendations Oral Care Recommendations: Oral care BID    Recommendations for follow up therapy are one component of a multi-disciplinary discharge planning process, led by the attending physician.  Recommendations may be updated based on patient status, additional functional criteria and insurance authorization.  Follow up Recommendations No SLP follow up        [place] Date of Onset: 02/23/22 HPI: Gary Rice is an 88 y.o. male who presented to the emergency department with a head injury secondary to a fall that occurred just prior to arrival. PMHx SVT, atrial fibrillation on Eliquis, hyperlipidemia. CT head negative for bleed/infarct; CT spine degenerative changes. Type of Study: Bedside Swallow Evaluation Previous Swallow Assessment: no Diet Prior to this Study: NPO Temperature Spikes Noted: No Respiratory Status: Room air History of Recent Intubation: No Behavior/Cognition: Alert;Cooperative;Pleasant mood Oral Cavity Assessment: Within Functional Limits Oral Care Completed by SLP: No Oral Cavity - Dentition: Dentures, top;Dentures, bottom Vision: Functional for self-feeding Self-Feeding Abilities: Able to feed self Patient Positioning: Upright in bed Baseline Vocal Quality: Normal Volitional Cough: Strong Volitional Swallow: Able to elicit    Oral/Motor/Sensory Function Overall Oral Motor/Sensory Function: Within functional limits   Ice Chips Ice chips: Within functional limits   Thin Liquid Thin Liquid: Within functional limits    Nectar  Thick Nectar Thick Liquid: Not tested   Honey Thick Honey Thick Liquid: Not tested   Puree Puree: Within functional limits   Solid     Solid: Within functional limits      Gary Rice 02/23/2022,4:16  PM  Estill Bamberg L. Tivis Ringer, [place] Office number [phone] Pager [phone]

## 2022-02-23 NOTE — Progress Notes (Signed)
   02/23/22 1055  Clinical Encounter Type  Visited With Patient;Health care provider  Visit Type ED;Trauma;Initial  Referral From Nurse (Raymar M. Elon Alas, RN)  Consult/Referral To Chaplain Melvenia Beam)  Spiritual Encounters  Spiritual Needs Emotional   Responded to page in E.D. Trauma Room B for Level 2 Trauma. [place] being evaluated and treated by medical staff at this time. No family present at this time.  1145 AM: Met Gary Rice at patient's bedside. Patient reported that he fell and hit his head while at home. He stated that his wife is at home alone at this time. Hospitality offered  but declined at this time. No needs at this time. Staff will page Chaplain upon request of patient or family.  Chaplain Chyna Kneece, M.Min., [phone].

## 2022-02-23 NOTE — ED Provider Notes (Signed)
Shared visit.  Patient here after syncopal event.  Sounds unprovoked.  Standing in his kitchen and then next thing he knew was on the floor.  Hit the back of his head.  Does not appear to have laceration per PA and appears to have skin tear.  He is on Eliquis for A-fib.  Per my review and interpretation of EKG he has sinus rhythm.  No ischemic changes.  No other major cardiac risk factors or medical problems.  However given unprovoked syncopal episode will likely admit for telemetry and echocardiogram as concern that this could have been arrhythmic process.  We will further work-up traumatic process with CT of his head, x-ray, basic labs.  Per my review and interpretation there are no significant electrolyte abnormality.  Troponin is normal.  Images of his head and neck are unremarkable.  Will admit for further care for syncope work-up.     Lennice Sites, DO 02/23/22 1245

## 2022-02-23 NOTE — Progress Notes (Signed)
Orthopedic Tech Progress Note Patient Details:  Gary Rice [facility] January 15, 1931 [id number]  Level 2 trauma   Patient ID: Gary Rice, male   DOB: May 27, 1931, 88 y.o.   MRN: [id number]  [name] 02/23/2022, 11:29 AM

## 2022-02-23 NOTE — ED Notes (Signed)
Trauma Response Nurse Documentation   Gary Rice is a 88 y.o. male arriving to [facility] ED via EMS  On Eliquis (apixaban) daily. Trauma was activated as a Level 2 based on the following trauma criteria Elderly patients > 65 with head trauma on anti-coagulation (excluding ASA). Trauma team at the bedside on patient arrival. Patient to CT with team. GCS 14.  History   Past Medical History:  Diagnosis Date   Astigmatism    Basal cell carcinoma    Inguinal hernia    bilateral   Macular degeneration, age related, nonexudative    bilateral   SVT (supraventricular tachycardia) ([place])      Past Surgical History:  Procedure Laterality Date   APPENDECTOMY     age 24   BOWEL RESECTION  05/09/2021   Procedure: SMALL BOWEL RESECTION;  Surgeon: Rolm Bookbinder, MD;  Location: [place];  Service: General;;   CATARACT EXTRACTION, BILATERAL     HERNIA REPAIR     RIH   INGUINAL HERNIA REPAIR Right 05/02/2021   Procedure: REPAIR  INCARCERATED INGUINAL HERNIA  WITH MESH;  Surgeon: Jesusita Oka, MD;  Location: [facility] [place];  Service: General;  Laterality: Right;   JOINT REPLACEMENT Right    RTHA   LAPAROSCOPY N/A 05/09/2021   Procedure: LAPAROSCOPY DIAGNOSTIC;  Surgeon: Rolm Bookbinder, MD;  Location: [facility];  Service: General;  Laterality: N/A;   LAPAROTOMY  05/09/2021   Procedure: EXPLORATORY LAPAROTOMY;  Surgeon: Rolm Bookbinder, MD;  Location: [facility];  Service: General;;   TONSILLECTOMY       Initial Focused Assessment (If applicable, or please see trauma documentation): A&Ox3, GCS 14-15 - slightly confused to events today, PERR 3 Small skin tear/abrasion to back of head, no sutures needed, hemorrhage controlled No other injuries identified  CT's Completed:   CT Head and CT C-Spine   Interventions:  IV, labs CXR CT Head/Cspine Admit for syncopal workup  Plan for disposition:  Admission to floor   Event Summary: Patient to be admitted for full medical workup due to syncopal  event.  Bedside handoff with ED RN Luiz Iron.    [place] Pope Serjio Deupree  Trauma Response RN  Please call TRN at [phone] for further assistance.

## 2022-02-23 NOTE — ED Notes (Signed)
Wife called, would like to know status of patient. Phone is [phone]

## 2022-02-23 NOTE — H&P (Addendum)
History and Physical    [facility] XKG:[id number] DOB: Aug 08, 1931 DOA: 02/23/2022  PCP: Josetta Huddle, MD  Patient coming from: Home  I have personally briefly reviewed patient's old medical records in [place]  Chief Complaint: Syncope  HPI: Wilburt Messina is a 88 y.o. male with medical history significant of paroxysmal A-fib on Eliquis, hyperlipidemia present here for evaluation of syncopal episode.  Patient reports that he was in the kitchen he started feeling dizzy, lightheaded and passed.  Reports that he fell backward hit his head.  His wife called EMS and brought him to the emergency department for further evaluation and management.  He currently denies headache, blurry vision, lightheadedness, dizziness, slurred speech, facial drop, chest pain, shortness of breath, palpitations, leg swelling, nausea, vomiting or diarrhea, fever or chills. 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  He lives with his wife at home and uses cane only when he goes outside.  No history of tobacco abuse, alcohol abuse, illicit drug use.  His last dose of Eliquis was last night.  He has been compliant with his medications.  ED Course: On arrival to ED: Patient afebrile, blood pressure 108/72, pulse 70, respiratory rate 17, maintaining oxygen saturation on room air  Review of Systems: As per HPI otherwise negative.    Past Medical History:  Diagnosis Date   Astigmatism    Basal cell carcinoma    Inguinal hernia    bilateral   Macular degeneration, age related, nonexudative    bilateral   SVT (supraventricular tachycardia) ([place])     Past Surgical History:  Procedure Laterality Date   APPENDECTOMY     age 68   BOWEL RESECTION  05/09/2021   Procedure: SMALL BOWEL RESECTION;  Surgeon: Rolm Bookbinder, MD;  Location: [place];  Service: General;;   CATARACT EXTRACTION, BILATERAL     HERNIA REPAIR     RIH   INGUINAL HERNIA REPAIR Right 05/02/2021    Procedure: REPAIR  INCARCERATED INGUINAL HERNIA  WITH MESH;  Surgeon: Jesusita Oka, MD;  Location: [place];  Service: General;  Laterality: Right;   JOINT REPLACEMENT Right    RTHA   LAPAROSCOPY N/A 05/09/2021   Procedure: LAPAROSCOPY DIAGNOSTIC;  Surgeon: Rolm Bookbinder, MD;  Location: [place];  Service: General;  Laterality: N/A;   LAPAROTOMY  05/09/2021   Procedure: EXPLORATORY LAPAROTOMY;  Surgeon: Rolm Bookbinder, MD;  Location: [place];  Service: General;;   TONSILLECTOMY       reports that he quit smoking about 71 years ago. His smoking use included cigarettes. He has never used smokeless tobacco. He reports that he does not drink alcohol and does not use drugs.  No Known Allergies  Family History  Problem Relation Age of Onset   Heart disease Father    Heart attack Father    Heart attack Son    Sleep apnea Son     Prior to Admission medications   Medication Sig Start Date End Date Taking? Authorizing Provider  apixaban (ELIQUIS) 5 MG TABS tablet Take 1 tablet (5 mg total) by mouth 2 (two) times daily. 06/25/21   Jerline Pain, MD  atorvastatin (LIPITOR) 10 MG tablet Take 10 mg by mouth daily. 08/14/21   [provider]  Cholecalciferol (VITAMIN D) 50 MCG (2000 UT) CAPS Take 50 mcg by mouth daily.    [provider]  diltiazem (CARDIZEM) 30 MG tablet Take 30 mg by mouth daily as needed (blood pressure,  chest pain).    [provider]  docusate sodium (COLACE) 100 MG capsule Take 1 capsule (100 mg total) by mouth 2 (two) times daily. Surfactant 06/10/21   Lavina Hamman, MD  metroNIDAZOLE (FLAGYL) 500 MG tablet Take 500 mg by mouth 3 (three) times daily.    [provider]  Multiple Vitamin (MULTIVITAMIN WITH MINERALS) TABS tablet Take 1 tablet by mouth daily.    [provider]  Multiple Vitamins-Minerals (PRESERVISION AREDS 2) CAPS Take 1 capsule 2 (two) times daily by mouth.    [provider]  pantoprazole (PROTONIX)  40 MG tablet Take 1 tablet (40 mg total) by mouth daily. 06/03/21   Angiulli, Lavon Paganini, PA-C  polyethylene glycol (MIRALAX / GLYCOLAX) 17 g packet Take 17 g by mouth daily. Stool softener 06/10/21   Lavina Hamman, MD  sulfamethoxazole-trimethoprim (BACTRIM DS) 800-160 MG tablet Take 1 tablet by mouth 2 (two) times daily.    [provider]  VITAMIN E PO Take 1 capsule by mouth daily.     [provider]    Physical Exam: Vitals:   02/23/22 1230 02/23/22 1245 02/23/22 1315 02/23/22 1400  BP: 131/75 119/78 115/71 122/84  Pulse: 77 84 83 87  Resp: '13 13 14 19  '$ Temp:      TempSrc:      SpO2: 97% 96% 97% 93%  Weight:      Height:        Constitutional: NAD, calm, comfortable, on room air, communicating well Eyes: PERRL, lids and conjunctivae normal ENMT: Mucous membranes are moist. Posterior pharynx clear of any exudate or lesions.Normal dentition.  Neck: normal, supple, no masses, no thyromegaly Respiratory: clear to auscultation bilaterally, no wheezing, no crackles. Normal respiratory effort. No accessory muscle use.  Cardiovascular: Regular rate and rhythm, no murmurs / rubs / gallops. No extremity edema. 2+ pedal pulses. No carotid bruits.  Abdomen: no tenderness, no masses palpated. No hepatosplenomegaly. Bowel sounds positive.  Musculoskeletal: no clubbing / cyanosis. No joint deformity upper and lower extremities. Good ROM, no contractures. Normal muscle tone.  Skin: Superficial skin abrasion on the back of his head.  Some dried blood noted neurologic: CN 2-12 grossly intact. Sensation intact, DTR normal. Strength 5/5 in all 4.  Psychiatric: Normal judgment and insight. Alert and oriented x 3. Normal mood.    Labs on Admission: I have personally reviewed following labs and imaging studies  CBC: Recent Labs  Lab 02/23/22 1110  WBC 11.6*  HGB 13.1  HCT 40.8  MCV 96.0  PLT 419   Basic Metabolic Panel: Recent Labs  Lab 02/23/22 1110  NA 139  K 4.6  CL  107  CO2 24  GLUCOSE 118*  BUN 26*  CREATININE 1.10  CALCIUM 9.2  MG 2.0   GFR: Estimated Creatinine Clearance: 47.3 mL/min (by C-G formula based on SCr of 1.1 mg/dL). Liver Function Tests: Recent Labs  Lab 02/23/22 1110  AST 29  ALT 25  ALKPHOS 60  BILITOT 0.8  PROT 7.3  ALBUMIN 3.4*   No results for input(s): LIPASE, AMYLASE in the last 168 hours. No results for input(s): AMMONIA in the last 168 hours. Coagulation Profile: No results for input(s): INR, PROTIME in the last 168 hours. Cardiac Enzymes: No results for input(s): CKTOTAL, CKMB, CKMBINDEX, TROPONINI in the last 168 hours. BNP (last 3 results) No results for input(s): PROBNP in the last 8760 hours. HbA1C: No results for input(s): HGBA1C in the last 72 hours. CBG: No results  for input(s): GLUCAP in the last 168 hours. Lipid Profile: No results for input(s): CHOL, HDL, LDLCALC, TRIG, CHOLHDL, LDLDIRECT in the last 72 hours. Thyroid Function Tests: Recent Labs    02/23/22 1136  TSH 4.342  FREET4 1.17*   Anemia Panel: No results for input(s): VITAMINB12, FOLATE, FERRITIN, TIBC, IRON, RETICCTPCT in the last 72 hours. Urine analysis:    Component Value Date/Time   COLORURINE YELLOW 06/08/2021 1316   APPEARANCEUR CLEAR 06/08/2021 1316   LABSPEC >1.030 (H) 06/08/2021 1316   PHURINE 6.0 06/08/2021 1316   GLUCOSEU NEGATIVE 06/08/2021 1316   HGBUR TRACE (A) 06/08/2021 1316   BILIRUBINUR NEGATIVE 06/08/2021 1316   KETONESUR NEGATIVE 06/08/2021 1316   PROTEINUR 30 (A) 06/08/2021 1316   NITRITE NEGATIVE 06/08/2021 1316   LEUKOCYTESUR SMALL (A) 06/08/2021 1316    Radiological Exams on Admission: CT Head Wo Contrast  Result Date: 02/23/2022 CLINICAL DATA:  Moderate to severe fall with head and neck injury. Taking Eliquis. EXAM: CT HEAD WITHOUT CONTRAST CT CERVICAL SPINE WITHOUT CONTRAST TECHNIQUE: Multidetector CT imaging of the head and cervical spine was performed following the standard protocol without  intravenous contrast. Multiplanar CT image reconstructions of the cervical spine were also generated. RADIATION DOSE REDUCTION: This exam was performed according to the departmental dose-optimization program which includes automated exposure control, adjustment of the mA and/or kV according to patient size and/or use of iterative reconstruction technique. COMPARISON:  None Available. FINDINGS: CT HEAD FINDINGS Brain: Mildly enlarged ventricles and cortical sulci. Minimal patchy white matter low density in both cerebral hemispheres. Right basal ganglia probable prominent perivascular space. No intracranial hemorrhage, mass lesion or CT evidence of acute infarction. Vascular: No hyperdense vessel or unexpected calcification. Skull: Normal. Negative for fracture or focal lesion. Sinuses/Orbits: Status post bilateral cataract extraction. Unremarkable bones and included paranasal sinuses. Other: None. CT CERVICAL SPINE FINDINGS Alignment: Grade 1 anterolisthesis at the C3-4 level. Grade 1 retrolisthesis at the C4-5 and C5-6 levels. Skull base and vertebrae: No acute fracture. No primary bone lesion or focal pathologic process. Soft tissues and spinal canal: No prevertebral fluid or swelling. No visible canal hematoma. Disc levels: Multilevel degenerative changes, including facet degenerative changes throughout the cervical spine. Upper chest: Clear lung apices. Other: None. IMPRESSION: 1. No skull fracture or intracranial hemorrhage. 2. No cervical spine fracture or traumatic subluxation. 3. Mild diffuse cerebral and cerebellar atrophy. 4. Minimal chronic small vessel white matter ischemic changes in both cerebral hemispheres. 5. Extensive cervical spine degenerative changes. Electronically Signed   By: Claudie Revering M.D.   On: 02/23/2022 12:27   CT Cervical Spine Wo Contrast  Result Date: 02/23/2022 CLINICAL DATA:  Moderate to severe fall with head and neck injury. Taking Eliquis. EXAM: CT HEAD WITHOUT CONTRAST CT  CERVICAL SPINE WITHOUT CONTRAST TECHNIQUE: Multidetector CT imaging of the head and cervical spine was performed following the standard protocol without intravenous contrast. Multiplanar CT image reconstructions of the cervical spine were also generated. RADIATION DOSE REDUCTION: This exam was performed according to the departmental dose-optimization program which includes automated exposure control, adjustment of the mA and/or kV according to patient size and/or use of iterative reconstruction technique. COMPARISON:  None Available. FINDINGS: CT HEAD FINDINGS Brain: Mildly enlarged ventricles and cortical sulci. Minimal patchy white matter low density in both cerebral hemispheres. Right basal ganglia probable prominent perivascular space. No intracranial hemorrhage, mass lesion or CT evidence of acute infarction. Vascular: No hyperdense vessel or unexpected calcification. Skull: Normal. Negative for fracture or focal lesion. Sinuses/Orbits: Status  post bilateral cataract extraction. Unremarkable bones and included paranasal sinuses. Other: None. CT CERVICAL SPINE FINDINGS Alignment: Grade 1 anterolisthesis at the C3-4 level. Grade 1 retrolisthesis at the C4-5 and C5-6 levels. Skull base and vertebrae: No acute fracture. No primary bone lesion or focal pathologic process. Soft tissues and spinal canal: No prevertebral fluid or swelling. No visible canal hematoma. Disc levels: Multilevel degenerative changes, including facet degenerative changes throughout the cervical spine. Upper chest: Clear lung apices. Other: None. IMPRESSION: 1. No skull fracture or intracranial hemorrhage. 2. No cervical spine fracture or traumatic subluxation. 3. Mild diffuse cerebral and cerebellar atrophy. 4. Minimal chronic small vessel white matter ischemic changes in both cerebral hemispheres. 5. Extensive cervical spine degenerative changes. Electronically Signed   By: Claudie Revering M.D.   On: 02/23/2022 12:27   DG Chest Port 1  View  Result Date: 02/23/2022 CLINICAL DATA:  Fall. Patient on blood thinners. Patient reports losing consciousness. EXAM: PORTABLE CHEST 1 VIEW COMPARISON:  05/17/2021 FINDINGS: Cardiac silhouette is normal in size. No mediastinal or hilar masses. Clear lungs.  No pleural effusion or pneumothorax. Skeletal structures are demineralized, grossly intact. IMPRESSION: No active disease. Electronically Signed   By: Lajean Manes M.D.   On: 02/23/2022 11:21    EKG: Independently reviewed.  Sinus rhythm.  No acute ST-T wave changes noted.  Assessment/Plan  Syncope: -Could be secondary to cardiac arrhythmias. -His vital signs currently stable.  Initial troponin negative.  TSH and magnesium: WNL.  EKG within normal limits.  Chest x-ray negative. -CT head and cervical spine negative for any acute findings. -Echo from 8/27 shows ejection fraction of 60 to 76%, diastolic parameters are indeterminate.  No aortic stenosis no regional wall motion abnormalities.  Mild LVH. -Admit patient under observation.  On telemetry. -Trend troponin.  We will check orthostatic vitals, check UA -Consult PT/OT/SLP -On fall precautions  Paroxysmal A-fib: -Rate controlled.  Continue home meds Cardizem and Eliquis -Monitor on telemetry  Head injury: initial encounter: -Superficial skin laceration. -CT head and cervical spine negative for any acute findings.  Hyperlipidemia: Continue statin  Right hip pain:  -History of right total hip replacement.  We will get x-ray -As needed Tylenol  Leukocytosis: WBC 11.1.  Likely reactive.  Repeat CBC tomorrow a.m.  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  DVT prophylaxis: Eliquis Code Status: Full code-confirmed with patient Family Communication: None present at bedside.  Plan of care discussed with patient in length and he verbalized understanding and agreed with it. Disposition Plan: Likely home Consults called: None Admission status:  Observation   Mckinley Jewel MD [facility]  If 7PM-7AM, please contact night-coverage [email or web address]  02/23/2022, 2:37 PM

## 2022-02-24 DIAGNOSIS — I959 Hypotension, unspecified: Secondary | ICD-10-CM | POA: Diagnosis not present

## 2022-02-24 DIAGNOSIS — Z85828 Personal history of other malignant neoplasm of skin: Secondary | ICD-10-CM | POA: Diagnosis not present

## 2022-02-24 DIAGNOSIS — R55 Syncope and collapse: Secondary | ICD-10-CM | POA: Diagnosis present

## 2022-02-24 DIAGNOSIS — M5030 Other cervical disc degeneration, unspecified cervical region: Secondary | ICD-10-CM | POA: Diagnosis present

## 2022-02-24 DIAGNOSIS — S0191XA Laceration without foreign body of unspecified part of head, initial encounter: Secondary | ICD-10-CM | POA: Diagnosis present

## 2022-02-24 DIAGNOSIS — M25551 Pain in right hip: Secondary | ICD-10-CM | POA: Diagnosis present

## 2022-02-24 DIAGNOSIS — Z87891 Personal history of nicotine dependence: Secondary | ICD-10-CM | POA: Diagnosis not present

## 2022-02-24 DIAGNOSIS — Y92000 Kitchen of unspecified non-institutional (private) residence as  the place of occurrence of the external cause: Secondary | ICD-10-CM | POA: Diagnosis not present

## 2022-02-24 DIAGNOSIS — I480 Paroxysmal atrial fibrillation: Secondary | ICD-10-CM | POA: Diagnosis present

## 2022-02-24 DIAGNOSIS — Z8249 Family history of ischemic heart disease and other diseases of the circulatory system: Secondary | ICD-10-CM | POA: Diagnosis not present

## 2022-02-24 DIAGNOSIS — D72829 Elevated white blood cell count, unspecified: Secondary | ICD-10-CM | POA: Diagnosis present

## 2022-02-24 DIAGNOSIS — E785 Hyperlipidemia, unspecified: Secondary | ICD-10-CM | POA: Diagnosis present

## 2022-02-24 DIAGNOSIS — W1830XA Fall on same level, unspecified, initial encounter: Secondary | ICD-10-CM | POA: Diagnosis present

## 2022-02-24 DIAGNOSIS — Z79899 Other long term (current) drug therapy: Secondary | ICD-10-CM | POA: Diagnosis not present

## 2022-02-24 DIAGNOSIS — Z96641 Presence of right artificial hip joint: Secondary | ICD-10-CM | POA: Diagnosis present

## 2022-02-24 DIAGNOSIS — Z7901 Long term (current) use of anticoagulants: Secondary | ICD-10-CM | POA: Diagnosis not present

## 2022-02-24 LAB — CBC
HCT: 37.7 % — ABNORMAL LOW (ref 39.0–52.0)
Hemoglobin: 12.9 g/dL — ABNORMAL LOW (ref 13.0–17.0)
MCH: 31.4 pg (ref 26.0–34.0)
MCHC: 34.2 g/dL (ref 30.0–36.0)
MCV: 91.7 fL (ref 80.0–100.0)
Platelets: 248 10*3/uL (ref 150–400)
RBC: 4.11 MIL/uL — ABNORMAL LOW (ref 4.22–5.81)
RDW: 13 % (ref 11.5–15.5)
WBC: 10.2 10*3/uL (ref 4.0–10.5)
nRBC: 0 % (ref 0.0–0.2)

## 2022-02-24 LAB — BASIC METABOLIC PANEL
Anion gap: 6 (ref 5–15)
BUN: 15 mg/dL (ref 8–23)
CO2: 23 mmol/L (ref 22–32)
Calcium: 9.1 mg/dL (ref 8.9–10.3)
Chloride: 106 mmol/L (ref 98–111)
Creatinine, Ser: 0.76 mg/dL (ref 0.61–1.24)
GFR, Estimated: >60 mL/min (ref >60)
Glucose, Bld: 95 mg/dL (ref 70–99)
Potassium: 3.6 mmol/L (ref 3.5–5.1)
Sodium: 135 mmol/L (ref 135–145)

## 2022-02-24 LAB — GLUCOSE, CAPILLARY: Glucose-Capillary: 107 mg/dL — ABNORMAL HIGH (ref 70–99)

## 2022-02-24 MED ORDER — MECLIZINE HCL 25 MG PO TABS
12.5000 mg | ORAL_TABLET | Freq: Three times a day (TID) | ORAL | Status: DC | PRN
Start: 2022-02-24 — End: 2022-02-26
  Administered 2022-02-24: 12.5 mg via ORAL
  Filled 2022-02-24 (×2): qty 1

## 2022-02-24 NOTE — Evaluation (Signed)
Occupational Therapy Evaluation Patient Details Name: Gary Rice MRN: [id number] DOB: 1931/03/19 Today's Date: 02/24/2022   History of Present Illness Gary Rice is a 88 y.o. male present here for evaluation of syncopal episode.  Patient reports that he was in the kitchen he started feeling dizzy, lightheaded and passed out. Head CT and cervical spine negative. Medical history significant of paroxysmal A-fib on Eliquis, hyperlipidemia and R THR.   Clinical Impression   Pt currently min guard assist for selfcare tasks and toileting with use of the RW for support.  Slight dizziness with supine to sit and with transition back to supine.  May need further testing to rule out/treat BPPV since pt did hit his head during the fall.  Feel he will benefit from acute care OT to work on progression back to modified independent level for selfcare tasks with PRN supervision from his spouse at home.        Recommendations for follow up therapy are one component of a multi-disciplinary discharge planning process, led by the attending physician.  Recommendations may be updated based on patient status, additional functional criteria and insurance authorization.   Follow Up Recommendations  Home health OT    Assistance Recommended at Discharge PRN  Patient can return home with the following Assistance with cooking/housework;Help with stairs or ramp for entrance    Functional Status Assessment  Patient has not had a recent decline in their functional status  Equipment Recommendations  None recommended by OT       Precautions / Restrictions Precautions Precautions: Fall Restrictions Weight Bearing Restrictions: No      Mobility Bed Mobility Overal bed mobility: Needs Assistance Bed Mobility: Supine to Sit     Supine to sit: Min assist     General bed mobility comments: Increased time compared to baseline secondary to soreness and dizziness.    Transfers Overall transfer level:  Needs assistance Equipment used: Rolling walker (2 wheels) Transfers: Sit to/from Stand, Bed to chair/wheelchair/BSC Sit to Stand: Min guard     Step pivot transfers: Min guard     General transfer comment: Pt needed assist of the RW for transfers.      Balance Overall balance assessment: Needs assistance Sitting-balance support: Feet supported, No upper extremity supported Sitting balance-Leahy Scale: Fair     Standing balance support: Bilateral upper extremity supported, During functional activity, Reliant on assistive device for balance                               ADL either performed or assessed with clinical judgement   ADL Overall ADL's : Needs assistance/impaired Eating/Feeding: Independent   Grooming: Wash/dry hands;Wash/dry face;Min guard;Standing   Upper Body Bathing: Supervision/ safety;Sitting   Lower Body Bathing: Min guard;Sit to/from stand   Upper Body Dressing : Set up;Sitting   Lower Body Dressing: Min guard;Sit to/from stand   Toilet Transfer: Min guard;Rolling walker (2 wheels);Regular Toilet;Grab bars   Toileting- Clothing Manipulation and Hygiene: Min guard;Sit to/from stand       Functional mobility during ADLs: Min guard;Rolling walker (2 wheels) General ADL Comments: HR in the low to mid 80s with mobility.  He needed use of the RW secondary to not being up recently and demonstrated awareness of the need for it to therapist.  Slight dizziness with supine to sit and with transition to supine from sitting.  My need to be further evaluated for vestibular cause (BPPV).  Vision Baseline Vision/History: 1 Wears glasses Ability to See in Adequate Light: 0 Adequate Patient Visual Report: No change from baseline Vision Assessment?: No apparent visual deficits     Perception Perception Perception: Within Functional Limits   Praxis Praxis Praxis: Intact    Pertinent Vitals/Pain Pain Assessment Pain Assessment: 0-10 Pain Score:  2  Pain Location: left shoulder Pain Descriptors / Indicators: Discomfort Pain Intervention(s): Limited activity within patient's tolerance, Monitored during session     Hand Dominance Right   Extremity/Trunk Assessment Upper Extremity Assessment Upper Extremity Assessment: Generalized weakness (4/5 throughout.  Slight pain reported in left shoulder but no restirctions noted.)   Lower Extremity Assessment Lower Extremity Assessment: Defer to PT evaluation   Cervical / Trunk Assessment Cervical / Trunk Assessment: Kyphotic   Communication Communication Communication: No difficulties   Cognition Arousal/Alertness: Awake/alert   Overall Cognitive Status: Within Functional Limits for tasks assessed                                                  Home Living Family/patient expects to be discharged to:: Private residence Living Arrangements: Spouse/significant other Available Help at Discharge: Family;Available 24 hours/day Type of Home: House Home Access: Stairs to enter [employer] of Steps: 13 steps to get in front door, 11 to garage, 4 steps by back deck Entrance Stairs-Rails: Right Home Layout: Two level;Able to live on main level with bedroom/bathroom;Bed/bath upstairs Alternate Level Stairs-Number of Steps: 17 steps Alternate Level Stairs-Rails: Can reach both;Left;Right Bathroom Shower/Tub: Occupational psychologist: Standard Bathroom Accessibility: Yes How Accessible: Accessible via walker Home Equipment: Grab bars - tub/shower;Cane - single point;Shower seat;BSC/3in1;Rolling Environmental consultant (2 wheels)   Additional Comments: no falls the last 9 months execpt for this syncopal episode      Prior Functioning/Environment               Mobility Comments: Used cane outside but not inside. ADLs Comments: modified independent with ADLs        OT Problem List: Decreased strength;Decreased activity tolerance;Impaired balance  (sitting and/or standing);Pain      OT Treatment/Interventions: Self-care/ADL training;Patient/family education;Therapeutic exercise;Balance training;Therapeutic activities;DME and/or AE instruction    OT Goals(Current goals can be found in the care plan section) Acute Rehab OT Goals Patient Stated Goal: Pt wants to get back to normal and figure out what caused him to pass out. OT Goal Formulation: With patient Time For Goal Achievement: 03/10/22 Potential to Achieve Goals: Good  OT Frequency: Min 2X/week       AM-PAC OT "6 Clicks" Daily Activity     Outcome Measure Help from another person eating meals?: None Help from another person taking care of personal grooming?: A Little Help from another person toileting, which includes using toliet, bedpan, or urinal?: A Little Help from another person bathing (including washing, rinsing, drying)?: A Little Help from another person to put on and taking off regular upper body clothing?: None Help from another person to put on and taking off regular lower body clothing?: A Little 6 Click Score: 20   End of Session Equipment Utilized During Treatment: Gait belt;Rolling walker (2 wheels) Nurse Communication: Mobility status  Activity Tolerance: Patient tolerated treatment well Patient left: in chair;with call bell/phone within reach;with chair alarm set  OT Visit Diagnosis: Unsteadiness on feet (R26.81);Muscle weakness (generalized) (M62.81);Dizziness and giddiness (R42)  Time: 2595-6387 OT Time Calculation (min): 49 min Charges:  OT Evaluation $OT Eval Moderate Complexity: 1 Mod OT Treatments $Self Care/Home Management : 23-37 mins  Lanson Randle OTR/L 02/24/2022, 10:56 AM

## 2022-02-24 NOTE — Evaluation (Signed)
Physical Therapy Evaluation Patient Details Name: Gary Rice MRN: [id number] DOB: 1930-12-11 Today's Date: 02/24/2022  History of Present Illness  Gary Rice is a 88 y.o. male present here for evaluation of syncopal episode.  Patient reports that he was in the kitchen he started feeling dizzy, lightheaded and passed out. Head CT and cervical spine negative. Medical history significant of paroxysmal A-fib on Eliquis, hyperlipidemia and R THR.    Clinical Impression  Gary Rice is 88 y.o. male admitted with above HPI and diagnosis. Patient is currently limited by functional impairments below (see PT problem list). Patient lives with his spouse and is independent at baseline with occasional use of SPC. Vestibular eval completed and pt noted to have slight Lt beating nystagmus in Rt/Lt gaze but VOR testing normal. Pt found to have Lt upward rotary nystagmus with Lt Marye Round and treated 1x with Eply's. Will plan for additional treatment session in effort to improve pt's balance/mobility prior to discharge home. Patient will benefit from continued skilled PT interventions to address impairments and progress independence with mobility, recommending HHPT for vestibular rehab. Acute PT will follow and progress as able.        Recommendations for follow up therapy are one component of a multi-disciplinary discharge planning process, led by the attending physician.  Recommendations may be updated based on patient status, additional functional criteria and insurance authorization.  Follow Up Recommendations Home health PT (vestibular)    Assistance Recommended at Discharge Intermittent Supervision/Assistance  Patient can return home with the following  A little help with walking and/or transfers;A little help with bathing/dressing/bathroom;Assistance with cooking/housework;Assist for transportation;Help with stairs or ramp for entrance    Equipment Recommendations None  recommended by PT  Recommendations for Other Services       Functional Status Assessment Patient has had a recent decline in their functional status and demonstrates the ability to make significant improvements in function in a reasonable and predictable amount of time.     Precautions / Restrictions Precautions Precautions: Fall Precaution Comments: dizziness Restrictions Weight Bearing Restrictions: No       Vestibular Assessment - 02/24/22 0001       Oculomotor Exam   Oculomotor Alignment Normal    Ocular ROM WFL    Spontaneous Absent    Gaze-induced  Left beating nystagmus with R gaze;Left beating nystagmus with L gaze    Head shaking Horizontal Absent    Head Shaking Vertical Absent    Smooth Pursuits Intact    Saccades Overshoots   on Rt saccade   Comment Test of Skew      Oculomotor Exam-Fixation Suppressed    Left Head Impulse intact    Right Head Impulse intact      Positional Testing   Dix-Hallpike Dix-Hallpike Right;Dix-Hallpike Left      Dix-Hallpike Right   Dix-Hallpike Right Duration no symptoms    Dix-Hallpike Right Symptoms No nystagmus      Dix-Hallpike Left   Dix-Hallpike Left Duration 15 seconds    Dix-Hallpike Left Symptoms Upbeat, left rotatory nystagmus              Mobility  Bed Mobility Overal bed mobility: Needs Assistance Bed Mobility: Rolling, Sidelying to Sit, Sit to Supine Rolling: Min assist Sidelying to sit: Min assist   Sit to supine: Min guard   General bed mobility comments: pt taking extra time to control lowering to supine in bed for BPPV testing. Min assist and verbal cues to sequence bed mobility  for treatment.    Transfers Overall transfer level: Needs assistance Equipment used: Rolling walker (2 wheels) Transfers: Sit to/from Stand, Bed to chair/wheelchair/BSC Sit to Stand: Min guard   Step pivot transfers: Min assist       General transfer comment: pt able to power up with bil UE from recliner to RW,  guarding for safety. min assist to steady with rise and steps to move bed>recliner at EOS after Eply's maneuver.    Ambulation/Gait Ambulation/Gait assistance: Min assist, Min guard Gait Distance (Feet): 200 Feet Assistive device: Rolling walker (2 wheels), None Gait Pattern/deviations: Step-through pattern, Decreased stride length, Trunk flexed Gait velocity: decr     General Gait Details: pt with overall cautious gait and steady with RW for support. Pt smooth during turn with no overt LOB. Amb short distance with no device, ~70', and pt required slightly increased assist to steady.  Stairs            Wheelchair Mobility    Modified Rankin (Stroke Patients Only)       Balance Overall balance assessment: Needs assistance Sitting-balance support: Feet supported, No upper extremity supported Sitting balance-Leahy Scale: Good Sitting balance - Comments: Momentary Lt lean after Epley's treatment but pt able to regain and stabilize balance   Standing balance support: Bilateral upper extremity supported, During functional activity Standing balance-Leahy Scale: Fair                               Pertinent Vitals/Pain Pain Assessment Pain Assessment: No/denies pain    Home Living Family/patient expects to be discharged to:: Private residence Living Arrangements: Spouse/significant other Available Help at Discharge: Family;Available 24 hours/day Type of Home: House Home Access: Stairs to enter Entrance Stairs-Rails: Right;Can reach both;Left (both in back, Rt in garage) Entrance Stairs-Number of Steps: 13 steps to get in front door, 11 to garage, 5 steps by back deck Alternate Level Stairs-Number of Steps: 17 steps Home Layout: Two level;Able to live on main level with bedroom/bathroom;Bed/bath upstairs Home Equipment: Grab bars - tub/shower;Cane - single point;Shower Conservator, museum/gallery (2 wheels) Additional Comments: no falls the last 9 months execpt  for this syncopal episode    Prior Function Prior Level of Function : Independent/Modified Independent             Mobility Comments: Used cane outside but not inside. ADLs Comments: modified independent with ADLs     Hand Dominance   Dominant Hand: Right    Extremity/Trunk Assessment   Upper Extremity Assessment Upper Extremity Assessment: Defer to OT evaluation;Overall [facility] for tasks assessed    Lower Extremity Assessment Lower Extremity Assessment: Overall WFL for tasks assessed    Cervical / Trunk Assessment Cervical / Trunk Assessment: Kyphotic  Communication   Communication: No difficulties  Cognition Arousal/Alertness: Awake/alert Behavior During Therapy: WFL for tasks assessed/performed Overall Cognitive Status: Within Functional Limits for tasks assessed                                          General Comments      Exercises     Assessment/Plan    PT Assessment Patient needs continued PT services  PT Problem List Decreased activity tolerance;Decreased balance;Decreased mobility;Decreased knowledge of use of DME       PT Treatment Interventions DME instruction;Gait training;Therapeutic activities;Stair training;Functional mobility training;Therapeutic exercise;Balance training;Neuromuscular re-education;Patient/family  education    PT Goals (Current goals can be found in the Care Plan section)  Acute Rehab PT Goals Patient Stated Goal: stop feeling dizzy and go home PT Goal Formulation: With patient Time For Goal Achievement: 03/10/22 Potential to Achieve Goals: Good    Frequency Min 3X/week     Co-evaluation               AM-PAC PT "6 Clicks" Mobility  Outcome Measure Help needed turning from your back to your side while in a flat bed without using bedrails?: A Little Help needed moving from lying on your back to sitting on the side of a flat bed without using bedrails?: A Little Help needed moving to and from a bed  to a chair (including a wheelchair)?: A Little Help needed standing up from a chair using your arms (e.g., wheelchair or bedside chair)?: A Little Help needed to walk in hospital room?: A Little Help needed climbing 3-5 steps with a railing? : A Little 6 Click Score: 18    End of Session Equipment Utilized During Treatment: Gait belt Activity Tolerance: Patient tolerated treatment well Patient left: in chair;with call bell/phone within reach;with chair alarm set Nurse Communication: Mobility status PT Visit Diagnosis: Unsteadiness on feet (R26.81);BPPV;Dizziness and giddiness (R42);Difficulty in walking, not elsewhere classified (R26.2) BPPV - Right/Left : Left    Time: 1321-1400 PT Time Calculation (min) (ACUTE ONLY): 39 min   Charges:   PT Evaluation $PT Eval Low Complexity: 1 Low PT Treatments $Gait Training: 8-22 mins $Physical Performance Test: 8-22 mins        Gary Rice, DPT Acute Rehabilitation Services Office [phone] Pager [phone]  02/24/22 5:00 PM

## 2022-02-24 NOTE — TOC CAGE-AID Note (Signed)
Transition of Care [facility]) - CAGE-AID Screening   Patient Details  Name: Gary Rice MRN: [id number] Date of Birth: 04/11/1931  Clinical Narrative:  Patient denies any substance use. No need for resources at this time.  CAGE-AID Screening:    Have You Ever Felt You Ought to Cut Down on Your Drinking or Drug Use?: No Have People Annoyed You By Critizing Your Drinking Or Drug Use?: No Have You Felt Bad Or Guilty About Your Drinking Or Drug Use?: No Have You Ever Had a Drink or Used Drugs First Thing In The Morning to Steady Your Nerves or to Get Rid of a Hangover?: No CAGE-AID Score: 0  Substance Abuse Education Offered: No

## 2022-02-24 NOTE — Progress Notes (Signed)
PROGRESS NOTE  Gary Rice [facility]  UXN:[id number] DOB: 1930/10/04 DOA: 02/23/2022 PCP: Josetta Huddle, MD   Brief Narrative: Patient is 88 year old male with history of paroxysmal A-fib on Eliquis, hyperlipidemia who presented for the evaluation of syncopal episode with fall.  He was working in the kitchen when he started feeling dizzy, lightheaded and passed out.  He fell backward and hit his head.  On condition he was hemodynamically stable.  CT head, cervical spine negative for any acute findings.  Patient admitted for further evaluation.  Assessment & Plan:  Principal Problem:   Syncope Active Problems:   Leukocytosis   Hyperlipidemia   Paroxysmal A-fib (HCC)   Head injury   Syncopal episode: Happened while working in the kitchen.  Fell backward, hit his head.  Unclear etiology.  CT head, cervical spine negative for acute findings.  Chest x-ray negative.  EKG did not show any abnormal findings. Last echo on 8/27 showed EF of 60 to 65%, indeterminate diastolic parameters.  Orthostatic vitals negative.  PT/OT consulted, recommended home health.  BPPV could not be ruled out.  OT planning to check him again tomorrow morning.  Paroxysmal A-fib: Currently in normal sinus rhythm.  Rate controlled.  On Cardizem, Eliquis  Head injury: Superficial skin tear, no laceration.  Continue supportive care  Hyperlipidemia: On statin  Right hip pain: X-ray did not show any fracture or dislocation.  Status post total hip replacement  Leukocytosis: Resolved           DVT prophylaxis:SCDs Start: 02/23/22 1436 apixaban (ELIQUIS) tablet 5 mg     Code Status: Full Code  Family Communication: Called spouse on phone on 5/30, call not received  Patient status:Inpatient  Patient is from :Home  Anticipated discharge UR:[facility]  Estimated DC date:tomorrow   Consultants: None  Procedures:None  Antimicrobials:  Anti-infectives (From admission, onward)    None       Subjective: Patient  seen and examined at the bedside this morning.  Hemodynamically stable during my evaluation.   comfortable without any complaints.  Denies any dizziness or lightheadedness  Objective: Vitals:   02/23/22 2045 02/24/22 0033 02/24/22 0632 02/24/22 0812  BP: 122/82 102/70 116/81 119/67  Pulse: 89 88 81 84  Resp: '18 18 19 18  '$ Temp: 98.2 F (36.8 C) 98.5 F (36.9 C) 98.2 F (36.8 C) 98.3 F (36.8 C)  TempSrc: Oral Oral Oral Oral  SpO2: 96% 92% 94% 94%  Weight:   69.5 kg   Height:        Intake/Output Summary (Last 24 hours) at 02/24/2022 1151 Last data filed at 02/23/2022 2047 Gross per 24 hour  Intake 300 ml  Output 600 ml  Net -300 ml   Filed Weights   02/23/22 1120 02/23/22 1125 02/24/22 6237  Weight: 73.5 kg 73.5 kg 69.5 kg    Examination:  General exam: Overall comfortable, not in distress HEENT: PERRL Respiratory system:  no wheezes or crackles  Cardiovascular system: S1 & S2 heard, RRR.  Gastrointestinal system: Abdomen is nondistended, soft and nontender. Central nervous system: Alert and oriented Extremities: No edema, no clubbing ,no cyanosis Skin: No rashes, no ulcers,no icterus     Data Reviewed: I have personally reviewed following labs and imaging studies  CBC: Recent Labs  Lab 02/23/22 1110 02/24/22 0551  WBC 11.6* 10.2  HGB 13.1 12.9*  HCT 40.8 37.7*  MCV 96.0 91.7  PLT 279 628   Basic Metabolic Panel: Recent Labs  Lab 02/23/22 1110 02/23/22 1322 02/24/22 0551  NA 139  --  135  K 4.6  --  3.6  CL 107  --  106  CO2 24  --  23  GLUCOSE 118*  --  95  BUN 26*  --  15  CREATININE 1.10  --  0.76  CALCIUM 9.2  --  9.1  MG 2.0  --   --   PHOS  --  2.5  --      No results found for this or any previous visit (from the past 240 hour(s)).   Radiology Studies: CT Head Wo Contrast  Result Date: 02/23/2022 CLINICAL DATA:  Moderate to severe fall with head and neck injury. Taking Eliquis. EXAM: CT HEAD WITHOUT CONTRAST CT CERVICAL SPINE WITHOUT  CONTRAST TECHNIQUE: Multidetector CT imaging of the head and cervical spine was performed following the standard protocol without intravenous contrast. Multiplanar CT image reconstructions of the cervical spine were also generated. RADIATION DOSE REDUCTION: This exam was performed according to the departmental dose-optimization program which includes automated exposure control, adjustment of the mA and/or kV according to patient size and/or use of iterative reconstruction technique. COMPARISON:  None Available. FINDINGS: CT HEAD FINDINGS Brain: Mildly enlarged ventricles and cortical sulci. Minimal patchy white matter low density in both cerebral hemispheres. Right basal ganglia probable prominent perivascular space. No intracranial hemorrhage, mass lesion or CT evidence of acute infarction. Vascular: No hyperdense vessel or unexpected calcification. Skull: Normal. Negative for fracture or focal lesion. Sinuses/Orbits: Status post bilateral cataract extraction. Unremarkable bones and included paranasal sinuses. Other: None. CT CERVICAL SPINE FINDINGS Alignment: Grade 1 anterolisthesis at the C3-4 level. Grade 1 retrolisthesis at the C4-5 and C5-6 levels. Skull base and vertebrae: No acute fracture. No primary bone lesion or focal pathologic process. Soft tissues and spinal canal: No prevertebral fluid or swelling. No visible canal hematoma. Disc levels: Multilevel degenerative changes, including facet degenerative changes throughout the cervical spine. Upper chest: Clear lung apices. Other: None. IMPRESSION: 1. No skull fracture or intracranial hemorrhage. 2. No cervical spine fracture or traumatic subluxation. 3. Mild diffuse cerebral and cerebellar atrophy. 4. Minimal chronic small vessel white matter ischemic changes in both cerebral hemispheres. 5. Extensive cervical spine degenerative changes. Electronically Signed   By: Claudie Revering M.D.   On: 02/23/2022 12:27   CT Cervical Spine Wo Contrast  Result Date:  02/23/2022 CLINICAL DATA:  Moderate to severe fall with head and neck injury. Taking Eliquis. EXAM: CT HEAD WITHOUT CONTRAST CT CERVICAL SPINE WITHOUT CONTRAST TECHNIQUE: Multidetector CT imaging of the head and cervical spine was performed following the standard protocol without intravenous contrast. Multiplanar CT image reconstructions of the cervical spine were also generated. RADIATION DOSE REDUCTION: This exam was performed according to the departmental dose-optimization program which includes automated exposure control, adjustment of the mA and/or kV according to patient size and/or use of iterative reconstruction technique. COMPARISON:  None Available. FINDINGS: CT HEAD FINDINGS Brain: Mildly enlarged ventricles and cortical sulci. Minimal patchy white matter low density in both cerebral hemispheres. Right basal ganglia probable prominent perivascular space. No intracranial hemorrhage, mass lesion or CT evidence of acute infarction. Vascular: No hyperdense vessel or unexpected calcification. Skull: Normal. Negative for fracture or focal lesion. Sinuses/Orbits: Status post bilateral cataract extraction. Unremarkable bones and included paranasal sinuses. Other: None. CT CERVICAL SPINE FINDINGS Alignment: Grade 1 anterolisthesis at the C3-4 level. Grade 1 retrolisthesis at the C4-5 and C5-6 levels. Skull base and vertebrae: No acute fracture. No primary bone lesion or focal pathologic process. Soft tissues  and spinal canal: No prevertebral fluid or swelling. No visible canal hematoma. Disc levels: Multilevel degenerative changes, including facet degenerative changes throughout the cervical spine. Upper chest: Clear lung apices. Other: None. IMPRESSION: 1. No skull fracture or intracranial hemorrhage. 2. No cervical spine fracture or traumatic subluxation. 3. Mild diffuse cerebral and cerebellar atrophy. 4. Minimal chronic small vessel white matter ischemic changes in both cerebral hemispheres. 5. Extensive  cervical spine degenerative changes. Electronically Signed   By: Claudie Revering M.D.   On: 02/23/2022 12:27   DG Chest Port 1 View  Result Date: 02/23/2022 CLINICAL DATA:  Fall. Patient on blood thinners. Patient reports losing consciousness. EXAM: PORTABLE CHEST 1 VIEW COMPARISON:  05/17/2021 FINDINGS: Cardiac silhouette is normal in size. No mediastinal or hilar masses. Clear lungs.  No pleural effusion or pneumothorax. Skeletal structures are demineralized, grossly intact. IMPRESSION: No active disease. Electronically Signed   By: Lajean Manes M.D.   On: 02/23/2022 11:21   DG HIP UNILAT WITH PELVIS 2-3 VIEWS RIGHT  Result Date: 02/23/2022 CLINICAL DATA:  [place] EXAM: DG HIP (WITH OR WITHOUT PELVIS) 2-3V RIGHT COMPARISON:  None Available. FINDINGS: Frontal view of the pelvis as well as frontal and frogleg lateral views of the right hip are obtained. Right hip arthroplasty is identified in the expected position with no signs of acute complication. Bones are diffusely osteopenic. No acute displaced fracture. Mild left hip osteoarthritis. Significant lower lumbar spondylosis and facet hypertrophy. IMPRESSION: 1. No acute displaced fracture. 2. Unremarkable right hip arthroplasty. 3. Mild left hip osteoarthritis. Electronically Signed   By: Randa Ngo M.D.   On: 02/23/2022 16:35    Scheduled Meds:  apixaban  5 mg Oral BID   atorvastatin  10 mg Oral QHS   cholecalciferol  2,000 Units Oral Daily   docusate sodium  100 mg Oral BID   multivitamin  1 tablet Oral Daily   multivitamin with minerals  1 tablet Oral Q breakfast   sodium chloride flush  3 mL Intravenous Q12H   Continuous Infusions:   LOS: 0 days   Shelly Coss, MD [facility] P5/30/2023, 11:51 AM

## 2022-02-24 NOTE — TOC Initial Note (Signed)
Transition of Care [facility]) - Initial/Assessment Note    Patient Details  Name: Gary Rice MRN: [id number] Date of Birth: 06/23/1931  Transition of Care [facility]) CM/SW Contact:    Bethena Roys, RN Phone Number: 02/24/2022, 2:20 PM  Clinical Narrative: Case Manager spoke with the patient regarding home health services. Patient is agreeable to services and he has used [facility] in the past. Case Manager made the referral with [facility] for PT/OT vestibular and  start of care to begin within 24-48 hours post transition home. No further needs identified at this time.   Expected Discharge Plan: [place] Barriers to Discharge: No Barriers Identified   Patient Goals and CMS Choice Patient states their goals for this hospitalization and ongoing recovery are:: to return home.   Choice offered to / list presented to : Patient (Patient has used [facility] in the past and wants to use again.)  Expected Discharge Plan and Services Expected Discharge Plan: [place] In-house Referral: NA Discharge Planning Services: CM Consult Post Acute Care Choice: [place] arrangements for the past 2 months: Single Family Home                   DME Agency: NA       HH Arranged: PT, OT HH Agency: [place] Date [place]: 02/24/22 Time HH Agency Contacted: 1419 Representative spoke with at [facility]: Malachy Mood  Prior Living Arrangements/Services Living arrangements for the past 2 months: [place] with:: Spouse Patient language and need for interpreter reviewed:: Yes Do you feel safe going back to the place where you live?: Yes      Need for Family Participation in Patient Care: No (Comment) Care giver support system in place?: No (comment)   Criminal Activity/Legal Involvement Pertinent to Current Situation/Hospitalization: No - Comment as needed  Activities of Daily Living   ADL  Screening (condition at time of admission) Patient's cognitive ability adequate to safely complete daily activities?: Yes Is the patient deaf or have difficulty hearing?: No Does the patient have difficulty seeing, even when wearing glasses/contacts?: No Does the patient have difficulty concentrating, remembering, or making decisions?: No Patient able to express need for assistance with ADLs?: Yes Does the patient have difficulty dressing or bathing?: No Independently performs ADLs?: Yes (appropriate for developmental age) Does the patient have difficulty walking or climbing stairs?: Yes Weakness of Legs: Both Weakness of Arms/Hands: None  Permission Sought/Granted Permission sought to share information with : Family Supports, Customer service manager, Case Optician, dispensing granted to share information with : Yes, Verbal Permission Granted     Permission granted to share info w AGENCY: [facility]        Emotional Assessment Appearance:: Appears stated age Attitude/Demeanor/Rapport: Engaged Affect (typically observed): Appropriate Orientation: : Oriented to Situation, Oriented to  Time, Oriented to Self, Oriented to Place Alcohol / Substance Use: Not Applicable Psych Involvement: No (comment)  Admission diagnosis:  Syncope [R55] Injury of head, initial encounter [S09.90XA] Fall, initial encounter [W19.XXXA] Syncope, unspecified syncope type [R55] Patient Active Problem List   Diagnosis Date Noted   Syncope 02/23/2022   Paroxysmal A-fib ([place]) 02/23/2022   Head injury 02/23/2022   Ileus ([place]) 06/08/2021   Cholecystitis, acute    Acute blood loss anemia    History of supraventricular tachycardia    Hyponatremia    Debility 05/26/2021   Pelvic abscess in male [facility]) 05/14/2021   Malnutrition of  moderate degree 05/12/2021   Atrial fibrillation with rapid ventricular response ([place]) 05/02/2021   SBO (small bowel obstruction) ([place]) 05/02/2021   Leukocytosis 05/02/2021    Hyperlipidemia 05/02/2021   Thoracic aortic aneurysm ([place]) 05/02/2021   Incarcerated hernia    PCP:  Josetta Huddle, MD Pharmacy:   [id number] [place] [place]Phone: [phone]: [phone] Readmission Risk Interventions    05/26/2021   11:17 AM  Readmission Risk Prevention Plan  Post Dischage Appt Not Complete  Appt Comments pt going to INPT rehab  Medication Screening Complete  Transportation Screening Complete

## 2022-02-25 DIAGNOSIS — R55 Syncope and collapse: Secondary | ICD-10-CM | POA: Diagnosis not present

## 2022-02-25 LAB — GLUCOSE, CAPILLARY
Glucose-Capillary: 123 mg/dL — ABNORMAL HIGH (ref 70–99)
Glucose-Capillary: 103 mg/dL — ABNORMAL HIGH (ref 70–99)

## 2022-02-25 MED ORDER — SODIUM CHLORIDE 0.9 % IV BOLUS
1000.0000 mL | Freq: Once | INTRAVENOUS | Status: AC
  Administered 2022-02-25: 1000 mL via INTRAVENOUS

## 2022-02-25 NOTE — Progress Notes (Signed)
PROGRESS NOTE  Gary Rice [facility]  YPP:[id number] DOB: [birth year] DOA: 02/23/2022 PCP: Josetta Huddle, MD   Brief Narrative: Patient is 88 year old male with history of paroxysmal A-fib on Eliquis, hyperlipidemia who presented for the evaluation of syncopal episode with fall.  He was working in the kitchen when he started feeling dizzy, lightheaded and passed out.  He fell backward and hit his head.  On condition he was hemodynamically stable.  CT head, cervical spine negative for any acute findings.  Patient admitted for further evaluation.  Assessment & Plan:  Principal Problem:   Syncope Active Problems:   Leukocytosis   Hyperlipidemia   Paroxysmal A-fib (HCC)   Head injury   Syncopal episode: Happened while working in the kitchen.  Fell backward, hit his head.  Unclear etiology.  CT head, cervical spine negative for acute findings.  Chest x-ray negative.  EKG did not show any abnormal findings. Last echo on 8/27 showed EF of 60 to 65%, indeterminate diastolic parameters.  Orthostatic vitals negative.  PT/OT consulted, recommended home health.  BPPV is a strong possibility.  While working with the physical therapist today, he became dizzy.  Discharge canceled.  Therapy will follow him tomorrow  Paroxysmal A-fib: Currently in normal sinus rhythm.  Rate controlled.  On Eliquis and as needed Cardizem  Hypotension: Patient became hypotensive this morning but was asymptomatic.  Bolused with 1 L.  Currently hemodynamically stable.  Continue to monitor blood pressure  Head injury: Superficial skin tear, no laceration.  Continue supportive care  Hyperlipidemia: On statin  Right hip pain: X-ray did not show any fracture or dislocation.  Status post total hip replacement  Leukocytosis: Resolved           DVT prophylaxis:SCDs Start: 02/23/22 1436 apixaban (ELIQUIS) tablet 5 mg     Code Status: Full Code  Family Communication: Called spouse on phone on 5/30, call not  received  Patient status:Inpatient  Patient is from :Home  Anticipated discharge WP:YKDX  Estimated DC date:tomorrow.  Needs therapy evaluation   Consultants: None  Procedures:None  Antimicrobials:  Anti-infectives (From admission, onward)    None       Subjective: Patient seen and examined at the bedside this morning.  During my evaluation, BLOOD pressure was soft but he was asymptomatic and he was lying on the chair.  Alert and oriented.  Denies any new complaints ,no dizziness or lightheadedness  Objective: Vitals:   02/25/22 0825 02/25/22 0830 02/25/22 0838 02/25/22 1141  BP: (!) 82/58 (!) 75/52 (!) 82/42 110/69  Pulse: 100   79  Resp: 17   17  Temp: 98.6 F (37 C)     TempSrc: Oral     SpO2: 94%   94%  Weight:      Height:        Intake/Output Summary (Last 24 hours) at 02/25/2022 1318 Last data filed at 02/25/2022 0852 Gross per 24 hour  Intake --  Output 1325 ml  Net -1325 ml   Filed Weights   02/23/22 1125 02/24/22 0632 02/25/22 0509  Weight: 73.5 kg 69.5 kg 68.2 kg    Examination:  General exam: Overall comfortable, not in distress HEENT: PERRL Respiratory system:  no wheezes or crackles  Cardiovascular system: S1 & S2 heard, RRR.  Gastrointestinal system: Abdomen is nondistended, soft and nontender. Central nervous system: Alert and oriented Extremities: No edema, no clubbing ,no cyanosis Skin: No rashes, no ulcers,no icterus     Data Reviewed: I have personally reviewed following labs and  imaging studies  CBC: Recent Labs  Lab 02/23/22 1110 02/24/22 0551  WBC 11.6* 10.2  HGB 13.1 12.9*  HCT 40.8 37.7*  MCV 96.0 91.7  PLT 279 149   Basic Metabolic Panel: Recent Labs  Lab 02/23/22 1110 02/23/22 1322 02/24/22 0551  NA 139  --  135  K 4.6  --  3.6  CL 107  --  106  CO2 24  --  23  GLUCOSE 118*  --  95  BUN 26*  --  15  CREATININE 1.10  --  0.76  CALCIUM 9.2  --  9.1  MG 2.0  --   --   PHOS  --  2.5  --      No  results found for this or any previous visit (from the past 240 hour(s)).   Radiology Studies: DG HIP UNILAT WITH PELVIS 2-3 VIEWS RIGHT  Result Date: 02/23/2022 CLINICAL DATA:  [place] EXAM: DG HIP (WITH OR WITHOUT PELVIS) 2-3V RIGHT COMPARISON:  None Available. FINDINGS: Frontal view of the pelvis as well as frontal and frogleg lateral views of the right hip are obtained. Right hip arthroplasty is identified in the expected position with no signs of acute complication. Bones are diffusely osteopenic. No acute displaced fracture. Mild left hip osteoarthritis. Significant lower lumbar spondylosis and facet hypertrophy. IMPRESSION: 1. No acute displaced fracture. 2. Unremarkable right hip arthroplasty. 3. Mild left hip osteoarthritis. Electronically Signed   By: Randa Ngo M.D.   On: 02/23/2022 16:35    Scheduled Meds:  apixaban  5 mg Oral BID   atorvastatin  10 mg Oral QHS   cholecalciferol  2,000 Units Oral Daily   docusate sodium  100 mg Oral BID   multivitamin  1 tablet Oral Daily   multivitamin with minerals  1 tablet Oral Q breakfast   sodium chloride flush  3 mL Intravenous Q12H   Continuous Infusions:   LOS: 1 day   Shelly Coss, MD [facility] P5/31/2023, 1:18 PM

## 2022-02-25 NOTE — Progress Notes (Signed)
Physical Therapy Treatment Patient Details Name: Gary Rice MRN: [id number] DOB: 12-09-1930 Today's Date: 02/25/2022   History of Present Illness Gary Rice is a 88 y.o. male present here for evaluation of syncopal episode.  Patient reports that he was in the kitchen he started feeling dizzy, lightheaded and passed out. Head CT and cervical spine negative. Medical history significant of paroxysmal A-fib on Eliquis, hyperlipidemia and R THR.    PT Comments    Patient continues to progress mobility gradually. Marye Round completed for Lt and Rt side and treated 1x on Lt with no symptoms sitting upright, then treated 2x on Rt with resolution of spinning after second maneuver and pt new c/o of slight "wooziness". BP stable in standing and with gait after ambulating ~220' with RW. Min guard required to steady but no LOB noted with gait with RW. Pt lives with spouse who cannot physically assist him and has no additional friends/family to assist him at home. He will benefit from additional session from PT/OT to evaluate if vertigo has improved to be able to safely mobilize at home. Will continue to progress as able.    Recommendations for follow up therapy are one component of a multi-disciplinary discharge planning process, led by the attending physician.  Recommendations may be updated based on patient status, additional functional criteria and insurance authorization.  Follow Up Recommendations  Home health PT (vestibular)     Assistance Recommended at Discharge Intermittent Supervision/Assistance  Patient can return home with the following A little help with walking and/or transfers;A little help with bathing/dressing/bathroom;Assistance with cooking/housework;Assist for transportation;Help with stairs or ramp for entrance   Equipment Recommendations  None recommended by PT    Recommendations for Other Services       Precautions / Restrictions Precautions Precautions:  Fall Precaution Comments: dizziness Restrictions Weight Bearing Restrictions: No      Vestibular Assessment - 02/25/22 0001       Positional Testing   Dix-Hallpike Dix-Hallpike Right;Dix-Hallpike Left      Dix-Hallpike Right   Dix-Hallpike Right Duration 10 seconds nystagmus, pt c/o spinning. treated 2x with symptoms resolved on second treatment    Dix-Hallpike Right Symptoms Upbeat, right rotatory nystagmus      Dix-Hallpike Left   Dix-Hallpike Left Duration 8 seconds, pt denied symptoms    Dix-Hallpike Left Symptoms Upbeat, left rotatory nystagmus             Mobility  Bed Mobility Overal bed mobility: Needs Assistance Bed Mobility: Rolling, Sidelying to Sit, Sit to Supine Rolling: Min assist Sidelying to sit: Min assist   Sit to supine: Min guard   General bed mobility comments: pt able to bring LE's onto bed to move from sit to long sit and then lower trunk/head for [employer] testing and Eply's maneuver. Min guard/assist for safety throughout in case of dizziness. pt completed 3x total.    Transfers Overall transfer level: Needs assistance Equipment used: Rolling walker (2 wheels) Transfers: Sit to/from Stand, Bed to chair/wheelchair/BSC Sit to Stand: Min guard   Step pivot transfers: Min guard, Min assist       General transfer comment: guarding for safety, pt able to rise and steady in stand. HHA to pivot to recliner with no device and guarding for safety with RW.    Ambulation/Gait Ambulation/Gait assistance: Min guard Gait Distance (Feet): 220 Feet Assistive device: Rolling walker (2 wheels) Gait Pattern/deviations: Step-through pattern, Decreased stride length, Trunk flexed Gait velocity: decr     General Gait  Details: pt more cautious after Eply's maneuver. No overt LOB noted with support of RW, close guarding for safety. Pt c/o sensation of imbalance but denied spinning/dizziness.   Stairs             Wheelchair Mobility     Modified Rankin (Stroke Patients Only)       Balance Overall balance assessment: Needs assistance Sitting-balance support: Feet supported, No upper extremity supported Sitting balance-Leahy Scale: Good Sitting balance - Comments: Momentary Rt lean after Epley's treatment but pt able to regain and stabilize balance in long sitting   Standing balance support: Bilateral upper extremity supported, During functional activity Standing balance-Leahy Scale: Fair Standing balance comment: reliant on RW                            Cognition Arousal/Alertness: Awake/alert Behavior During Therapy: WFL for tasks assessed/performed Overall Cognitive Status: Within Functional Limits for tasks assessed                                          Exercises      General Comments        Pertinent Vitals/Pain Pain Assessment Pain Assessment: No/denies pain    Home Living                          Prior Function            PT Goals (current goals can now be found in the care plan section) Acute Rehab PT Goals Patient Stated Goal: stop feeling dizzy and go home PT Goal Formulation: With patient Time For Goal Achievement: 03/10/22 Potential to Achieve Goals: Good Progress towards PT goals: Progressing toward goals    Frequency    Min 3X/week      PT Plan Current plan remains appropriate    Co-evaluation              AM-PAC PT "6 Clicks" Mobility   Outcome Measure  Help needed turning from your back to your side while in a flat bed without using bedrails?: A Little Help needed moving from lying on your back to sitting on the side of a flat bed without using bedrails?: A Little Help needed moving to and from a bed to a chair (including a wheelchair)?: A Little Help needed standing up from a chair using your arms (e.g., wheelchair or bedside chair)?: A Little Help needed to walk in hospital room?: A Little Help needed climbing 3-5  steps with a railing? : A Little 6 Click Score: 18    End of Session Equipment Utilized During Treatment: Gait belt Activity Tolerance: Patient tolerated treatment well Patient left: in chair;with call bell/phone within reach;with chair alarm set Nurse Communication: Mobility status PT Visit Diagnosis: Unsteadiness on feet (R26.81);BPPV;Dizziness and giddiness (R42);Difficulty in walking, not elsewhere classified (R26.2) BPPV - Right/Left : Left     Time: 8937-3428 PT Time Calculation (min) (ACUTE ONLY): 52 min  Charges:  $Gait Training: 8-22 mins $Canalith Rep Proc: 8-22 mins $Physical Performance Test: 8-22 mins                     Verner Mould, DPT Acute Rehabilitation Services Office [phone] Pager [phone]  02/25/22 3:44 PM

## 2022-02-25 NOTE — Progress Notes (Signed)
PT Cancellation Note  Patient Details Name: Gram Siedlecki MRN: [id number] DOB: 1931/02/12   Cancelled Treatment:    Reason Eval/Treat Not Completed: Medical issues which prohibited therapy (Pt hypotensive this AM, plan for bolus. Will follow up at later time as pt able and schedule allows.)   Verner Mould, DPT Acute Rehabilitation Services Office [phone] Pager [phone]  02/25/22 8:51 AM

## 2022-02-26 ENCOUNTER — Other Ambulatory Visit (HOSPITAL_COMMUNITY): Payer: Self-pay

## 2022-02-26 DIAGNOSIS — R55 Syncope and collapse: Secondary | ICD-10-CM | POA: Diagnosis not present

## 2022-02-26 LAB — GLUCOSE, CAPILLARY: Glucose-Capillary: 106 mg/dL — ABNORMAL HIGH (ref 70–99)

## 2022-02-26 MED ORDER — MECLIZINE HCL 12.5 MG PO TABS
12.5000 mg | ORAL_TABLET | Freq: Three times a day (TID) | ORAL | 0 refills | Status: DC | PRN
  Filled 2022-02-26: qty 30, 10d supply, fill #0

## 2022-02-26 NOTE — Progress Notes (Signed)
Occupational Therapy Treatment Patient Details Name: Gary Rice MRN: [id number] DOB: Oct 26, 1930 Today's Date: 02/26/2022   History of present illness Gary Rice is a 88 y.o. male present here for evaluation of syncopal episode.  Patient reports that he was in the kitchen he started feeling dizzy, lightheaded and passed out. Head CT and cervical spine negative. Medical history significant of paroxysmal A-fib on Eliquis, hyperlipidemia and R THR.   OT comments  Pt currently still with some dizziness during positional change with supine to long sitting this session, but could not provoke dizziness or nystagmus with Epley maneuver.   Horizontal canals were negative for BPPV with testing this session.  Reliability of testing posterior canals using the Epley could be compromised based on patients inability to complete maneuver quick enough to elicit symptoms as well.  Recommend continued evaluation and treatment through [facility] as well as continuing to work on ADL independence.      Recommendations for follow up therapy are one component of a multi-disciplinary discharge planning process, led by the attending physician.  Recommendations may be updated based on patient status, additional functional criteria and insurance authorization.    Follow Up Recommendations  Home health OT    Assistance Recommended at Discharge PRN  Patient can return home with the following  Assistance with cooking/housework;Help with stairs or ramp for entrance   Equipment Recommendations  None recommended by OT       Precautions / Restrictions Precautions Precautions: Fall Precaution Comments: dizziness Restrictions Weight Bearing Restrictions: No       Mobility Bed Mobility   Bed Mobility: Sit to Supine, Supine to Sit     Supine to sit: Supervision Sit to supine: Supervision        Transfers Overall transfer level: Needs assistance Equipment used: None Transfers: Sit to/from Stand, Bed to  chair/wheelchair/BSC (supervision) Sit to Stand: Supervision     Step pivot transfers: Supervision           Balance Overall balance assessment: Needs assistance Sitting-balance support: Feet supported, No upper extremity supported Sitting balance-Leahy Scale: Good     Standing balance support: During functional activity, Reliant on assistive device for balance Standing balance-Leahy Scale: Fair Standing balance comment: Pt needs UE support on a surface or with the walker when completing transfer.                           ADL either performed or assessed with clinical judgement   ADL Overall ADL's : Needs assistance/impaired                                       General ADL Comments: Pt currently min guard assist for transfer from recliner to bed and back without an assistive device.  Recommend use of the RW initially at home secondary to balance issues and continuing BPPV.  Pt with dizziness after coming up to long sitting from supine with lean to the right.  Unable to determine nystagmus direction secondary to pt closing his eyes.  Upon re-testing the right and left posterior canals, no dizziness was noted with Epley maneuver.  This result could also be affected secondary to pt not being able to change positions quickly during testing secondary to increased pain and pre-existing orthopedic conditions.  Recommend continued evaluation and treatment via home health.  Cognition Arousal/Alertness: Awake/alert Behavior During Therapy: WFL for tasks assessed/performed Overall Cognitive Status: Within Functional Limits for tasks assessed                                                     Pertinent Vitals/ Pain       Pain Assessment Pain Assessment: Faces Faces Pain Scale: Hurts a little bit Pain Location: generailized in the back and neck during bed mobility Pain Descriptors / Indicators: Discomfort Pain  Intervention(s): Monitored during session, Repositioned         Frequency  Min 2X/week        Progress Toward Goals  OT Goals(current goals can now be found in the care plan section)  Progress towards OT goals: Progressing toward goals  Acute Rehab OT Goals Patient Stated Goal: Pt wants to get his dizziness better OT Goal Formulation: With patient Time For Goal Achievement: 03/10/22 Potential to Achieve Goals: Good  Plan         AM-PAC OT "6 Clicks" Daily Activity     Outcome Measure   Help from another person eating meals?: None Help from another person taking care of personal grooming?: A Little Help from another person toileting, which includes using toliet, bedpan, or urinal?: A Little Help from another person bathing (including washing, rinsing, drying)?: A Little Help from another person to put on and taking off regular upper body clothing?: None Help from another person to put on and taking off regular lower body clothing?: A Little 6 Click Score: 20    End of Session    OT Visit Diagnosis: Unsteadiness on feet (R26.81);Muscle weakness (generalized) (M62.81);BPPV   Activity Tolerance Patient tolerated treatment well   Patient Left in chair;with call bell/phone within reach;with chair alarm set   Nurse Communication Mobility status        Time: 1010-1048 OT Time Calculation (min): 38 min  Charges: OT General Charges $OT Visit: 1 Visit OT Treatments $Self Care/Home Management : 38-52 mins  Dion Sibal OTR/L 02/26/2022, 11:37 AM

## 2022-02-26 NOTE — TOC Transition Note (Signed)
Transition of Care [facility]) - CM/SW Discharge Note   Patient Details  Name: Gary Rice MRN: [id number] Date of Birth: 02-27-1931  [facility] of [facility] CM/SW Contact:  Bethena Roys, RN Phone Number: 02/26/2022, 11:45 AM   Clinical Narrative: Case Manager spoke with the patient regarding disposition needs. Case Manager called [employer] and the patient received a tolling walker in September. Patient states the rolling walker is heavy and wants a lighter walker. He likes the RW in the room ([employer]) and Case Manager did find one on [place] patient states he will look into ordering one. Patient states he has one son; however, he lives out of town. Patient will transition home via cab. No further needs identified at this time.      Final next level of care: [place] Barriers to Discharge: No Barriers Identified   Patient Goals and CMS Choice Patient states their goals for this hospitalization and ongoing recovery are:: to return home.   Choice offered to / list presented to : Patient (Patient has used [facility] in the past and wants to use again.)  Discharge Plan and Services In-house Referral: NA Discharge Planning Services: CM Consult Post Acute Care Choice: Home Health            DME Agency: NA       HH Arranged: PT, OT HH Agency: [place] Date [place]: 02/24/22 Time [place]: 1419 Representative spoke with at [place]: Malachy Mood  Readmission Risk Interventions    05/26/2021   11:17 AM  Readmission Risk Prevention Plan  Post Dischage Appt Not Complete  Appt Comments pt going to INPT rehab  Medication Screening Complete  Transportation Screening Complete

## 2022-02-26 NOTE — Plan of Care (Signed)

## 2022-02-26 NOTE — Discharge Summary (Signed)
Physician Discharge Summary  Gary Rice [facility] LNL:[id number] DOB: [birth year] DOA: 02/23/2022  PCP: Josetta Huddle, MD  Admit date: 02/23/2022 Discharge date: 02/26/2022  Admitted From: Home Disposition:  Home  Discharge Condition:Stable CODE STATUS:FULL Diet recommendation: Heart Healthy   Brief/Interim Summary:  Patient is 88 year old male with history of paroxysmal A-fib on Eliquis, hyperlipidemia who presented for the evaluation of syncopal episode with fall.  He was working in the kitchen when he started feeling dizzy, lightheaded and passed out.  He fell backward and hit his head.  On condition he was hemodynamically stable.  CT head, cervical spine negative for any acute findings.  Patient admitted for further evaluation.  Hospital course remarkable for dizziness while being ambulated.  He was given vestibular therapy by OT.  He qualified for home health.  Medically stable for discharge.  Following problems were addressed during his hospitalization:  Syncopal episode: Happened while working in the kitchen.  Fell backward, hit his head.  Unclear etiology.  CT head, cervical spine negative for acute findings.  Chest x-ray negative.  EKG did not show any abnormal findings. Last echo on 8/27 showed EF of 60 to 65%, indeterminate diastolic parameters.  Orthostatic vitals negative.  PT/OT consulted, recommended home health.  BPPV suspected, given vestibular therapy.   Paroxysmal A-fib: Currently in normal sinus rhythm.  Rate controlled.  On Eliquis and as needed Cardizem   Hypotension: Patient became hypotensive in the morning of 5/31 but was asymptomatic.  Bolused with 1 L.  Currently hemodynamically stable.     Head injury: Superficial skin tear, no laceration.  Continue supportive care   Hyperlipidemia: On statin   Right hip pain: X-ray did not show any fracture or dislocation.  Status post total hip replacement   Leukocytosis: Resolved    Discharge Diagnoses:  Principal Problem:    Syncope Active Problems:   Leukocytosis   Hyperlipidemia   Paroxysmal A-fib (HCC)   Head injury    Discharge Instructions  Discharge Instructions     Diet - low sodium heart healthy   Complete by: As directed    Discharge instructions   Complete by: As directed    1)Please follow-up with outpatient PT/OT 2)Follow up with your PCP in a week   Increase activity slowly   Complete by: As directed       Allergies as of 02/26/2022   No Known Allergies      Medication List     TAKE these medications    apixaban 5 MG Tabs tablet Commonly known as: ELIQUIS Take 1 tablet (5 mg total) by mouth 2 (two) times daily.   atorvastatin 10 MG tablet Commonly known as: LIPITOR Take 10 mg by mouth at bedtime.   diltiazem 30 MG tablet Commonly known as: CARDIZEM Take 30 mg by mouth daily as needed ("for blood pressure and/or chest pain").   docusate sodium 100 MG capsule Commonly known as: COLACE Take 1 capsule (100 mg total) by mouth 2 (two) times daily. Surfactant   meclizine 12.5 MG tablet Commonly known as: ANTIVERT Take 1 tablet (12.5 mg total) by mouth 3 (three) times daily as needed for dizziness.   multivitamin with minerals Tabs tablet Take 1 tablet by mouth daily with breakfast.   pantoprazole 40 MG tablet Commonly known as: PROTONIX Take 1 tablet (40 mg total) by mouth daily.   polyethylene glycol 17 g packet Commonly known as: MIRALAX / GLYCOLAX Take 17 g by mouth daily. Stool softener What changed:  when to take this  reasons to take this additional instructions   PreserVision AREDS 2 Caps Take 1 capsule by mouth in the morning and at bedtime.   psyllium 58.6 % packet Commonly known as: METAMUCIL Take 1 packet by mouth daily as needed (for constipation).   Vitamin D3 50 MCG (2000 UT) Tabs Take 2,000 Units by mouth daily. What changed: Another medication with the same name was removed. Continue taking this medication, and follow the directions you see  here.   VITAMIN E PO Take 1 capsule by mouth daily.        Follow-up Information     [facility].   Why: Physical and Occupational Therapy- vestibular. Contact information: [place] [address] [phone]         Josetta Huddle, MD. Schedule an appointment as soon as possible for a visit in 1 week(s).   Specialty: Internal Medicine Contact information: [address] [place] [address] [phone]         Jerline Pain, MD .   Specialty: Cardiology Contact information: [phone] [address] [place] [address] [phone]                No Known Allergies  Consultations: None   Procedures/Studies: CT Head Wo Contrast  Result Date: 02/23/2022 CLINICAL DATA:  Moderate to severe fall with head and neck injury. Taking Eliquis. EXAM: CT HEAD WITHOUT CONTRAST CT CERVICAL SPINE WITHOUT CONTRAST TECHNIQUE: Multidetector CT imaging of the head and cervical spine was performed following the standard protocol without intravenous contrast. Multiplanar CT image reconstructions of the cervical spine were also generated. RADIATION DOSE REDUCTION: This exam was performed according to the departmental dose-optimization program which includes automated exposure control, adjustment of the mA and/or kV according to patient size and/or use of iterative reconstruction technique. COMPARISON:  None Available. FINDINGS: CT HEAD FINDINGS Brain: Mildly enlarged ventricles and cortical sulci. Minimal patchy white matter low density in both cerebral hemispheres. Right basal ganglia probable prominent perivascular space. No intracranial hemorrhage, mass lesion or CT evidence of acute infarction. Vascular: No hyperdense vessel or unexpected calcification. Skull: Normal. Negative for fracture or focal lesion. Sinuses/Orbits: Status post bilateral cataract extraction. Unremarkable bones and included paranasal  sinuses. Other: None. CT CERVICAL SPINE FINDINGS Alignment: Grade 1 anterolisthesis at the C3-4 level. Grade 1 retrolisthesis at the C4-5 and C5-6 levels. Skull base and vertebrae: No acute fracture. No primary bone lesion or focal pathologic process. Soft tissues and spinal canal: No prevertebral fluid or swelling. No visible canal hematoma. Disc levels: Multilevel degenerative changes, including facet degenerative changes throughout the cervical spine. Upper chest: Clear lung apices. Other: None. IMPRESSION: 1. No skull fracture or intracranial hemorrhage. 2. No cervical spine fracture or traumatic subluxation. 3. Mild diffuse cerebral and cerebellar atrophy. 4. Minimal chronic small vessel white matter ischemic changes in both cerebral hemispheres. 5. Extensive cervical spine degenerative changes. Electronically Signed   By: Claudie Revering M.D.   On: 02/23/2022 12:27   CT Cervical Spine Wo Contrast  Result Date: 02/23/2022 CLINICAL DATA:  Moderate to severe fall with head and neck injury. Taking Eliquis. EXAM: CT HEAD WITHOUT CONTRAST CT CERVICAL SPINE WITHOUT CONTRAST TECHNIQUE: Multidetector CT imaging of the head and cervical spine was performed following the standard protocol without intravenous contrast. Multiplanar CT image reconstructions of the cervical spine were also generated. RADIATION DOSE REDUCTION: This exam was performed according to the departmental dose-optimization program which includes automated exposure control,  adjustment of the mA and/or kV according to patient size and/or use of iterative reconstruction technique. COMPARISON:  None Available. FINDINGS: CT HEAD FINDINGS Brain: Mildly enlarged ventricles and cortical sulci. Minimal patchy white matter low density in both cerebral hemispheres. Right basal ganglia probable prominent perivascular space. No intracranial hemorrhage, mass lesion or CT evidence of acute infarction. Vascular: No hyperdense vessel or unexpected calcification.  Skull: Normal. Negative for fracture or focal lesion. Sinuses/Orbits: Status post bilateral cataract extraction. Unremarkable bones and included paranasal sinuses. Other: None. CT CERVICAL SPINE FINDINGS Alignment: Grade 1 anterolisthesis at the C3-4 level. Grade 1 retrolisthesis at the C4-5 and C5-6 levels. Skull base and vertebrae: No acute fracture. No primary bone lesion or focal pathologic process. Soft tissues and spinal canal: No prevertebral fluid or swelling. No visible canal hematoma. Disc levels: Multilevel degenerative changes, including facet degenerative changes throughout the cervical spine. Upper chest: Clear lung apices. Other: None. IMPRESSION: 1. No skull fracture or intracranial hemorrhage. 2. No cervical spine fracture or traumatic subluxation. 3. Mild diffuse cerebral and cerebellar atrophy. 4. Minimal chronic small vessel white matter ischemic changes in both cerebral hemispheres. 5. Extensive cervical spine degenerative changes. Electronically Signed   By: Claudie Revering M.D.   On: 02/23/2022 12:27   DG Chest Port 1 View  Result Date: 02/23/2022 CLINICAL DATA:  Fall. Patient on blood thinners. Patient reports losing consciousness. EXAM: PORTABLE CHEST 1 VIEW COMPARISON:  05/17/2021 FINDINGS: Cardiac silhouette is normal in size. No mediastinal or hilar masses. Clear lungs.  No pleural effusion or pneumothorax. Skeletal structures are demineralized, grossly intact. IMPRESSION: No active disease. Electronically Signed   By: Lajean Manes M.D.   On: 02/23/2022 11:21   DG HIP UNILAT WITH PELVIS 2-3 VIEWS RIGHT  Result Date: 02/23/2022 CLINICAL DATA:  [place] EXAM: DG HIP (WITH OR WITHOUT PELVIS) 2-3V RIGHT COMPARISON:  None Available. FINDINGS: Frontal view of the pelvis as well as frontal and frogleg lateral views of the right hip are obtained. Right hip arthroplasty is identified in the expected position with no signs of acute complication. Bones are diffusely osteopenic. No acute displaced  fracture. Mild left hip osteoarthritis. Significant lower lumbar spondylosis and facet hypertrophy. IMPRESSION: 1. No acute displaced fracture. 2. Unremarkable right hip arthroplasty. 3. Mild left hip osteoarthritis. Electronically Signed   By: Randa Ngo M.D.   On: 02/23/2022 16:35      Subjective: Patient seen and examined at the bedside this morning.  Hemodynamically stable for discharge today.  I called the spouse several times for update but calls not received.  Discharge Exam: Vitals:   02/25/22 2028 02/26/22 0450  BP: 100/69 125/90  Pulse: 85 82  Resp: 16 16  Temp: 98.7 F (37.1 C) (!) 97 F (36.1 C)  SpO2: 94%    Vitals:   02/25/22 0838 02/25/22 1141 02/25/22 2028 02/26/22 0450  BP: (!) 82/42 110/69 100/69 125/90  Pulse:  79 85 82  Resp:  '17 16 16  '$ Temp:   98.7 F (37.1 C) (!) 97 F (36.1 C)  TempSrc:   Oral Axillary  SpO2:  94% 94%   Weight:    71.3 kg  Height:        General: Pt is alert, awake, not in acute distress Cardiovascular: RRR, S1/S2 +, no rubs, no gallops Respiratory: CTA bilaterally, no wheezing, no rhonchi Abdominal: Soft, NT, ND, bowel sounds + Extremities: no edema, no cyanosis    The results of significant diagnostics from this hospitalization (including imaging, microbiology,  ancillary and laboratory) are listed below for reference.     Microbiology: No results found for this or any previous visit (from the past 240 hour(s)).   Labs: BNP (last 3 results) No results for input(s): BNP in the last 8760 hours. Basic Metabolic Panel: Recent Labs  Lab 02/23/22 1110 02/23/22 1322 02/24/22 0551  NA 139  --  135  K 4.6  --  3.6  CL 107  --  106  CO2 24  --  23  GLUCOSE 118*  --  95  BUN 26*  --  15  CREATININE 1.10  --  0.76  CALCIUM 9.2  --  9.1  MG 2.0  --   --   PHOS  --  2.5  --    Liver Function Tests: Recent Labs  Lab 02/23/22 1110  AST 29  ALT 25  ALKPHOS 60  BILITOT 0.8  PROT 7.3  ALBUMIN 3.4*   No results for  input(s): LIPASE, AMYLASE in the last 168 hours. No results for input(s): AMMONIA in the last 168 hours. CBC: Recent Labs  Lab 02/23/22 1110 02/24/22 0551  WBC 11.6* 10.2  HGB 13.1 12.9*  HCT 40.8 37.7*  MCV 96.0 91.7  PLT 279 248   Cardiac Enzymes: No results for input(s): CKTOTAL, CKMB, CKMBINDEX, TROPONINI in the last 168 hours. BNP: Invalid input(s): POCBNP CBG: Recent Labs  Lab 02/24/22 0629 02/25/22 0515 02/25/22 0753 02/26/22 0558  GLUCAP 107* 123* 103* 106*   D-Dimer No results for input(s): DDIMER in the last 72 hours. Hgb A1c No results for input(s): HGBA1C in the last 72 hours. Lipid Profile No results for input(s): CHOL, HDL, LDLCALC, TRIG, CHOLHDL, LDLDIRECT in the last 72 hours. Thyroid function studies Recent Labs    02/23/22 1136  TSH 4.342   Anemia work up No results for input(s): VITAMINB12, FOLATE, FERRITIN, TIBC, IRON, RETICCTPCT in the last 72 hours. Urinalysis    Component Value Date/Time   COLORURINE YELLOW 02/23/2022 1703   APPEARANCEUR CLEAR 02/23/2022 1703   LABSPEC 1.015 02/23/2022 1703   PHURINE 6.0 02/23/2022 1703   GLUCOSEU NEGATIVE 02/23/2022 1703   HGBUR NEGATIVE 02/23/2022 1703   BILIRUBINUR NEGATIVE 02/23/2022 1703   KETONESUR 5 (A) 02/23/2022 1703   PROTEINUR NEGATIVE 02/23/2022 1703   NITRITE NEGATIVE 02/23/2022 1703   LEUKOCYTESUR NEGATIVE 02/23/2022 1703   Sepsis Labs Invalid input(s): PROCALCITONIN,  WBC,  LACTICIDVEN Microbiology No results found for this or any previous visit (from the past 240 hour(s)).  Please note: You were cared for by a hospitalist during your hospital stay. Once you are discharged, your primary care physician will handle any further medical issues. Please note that NO REFILLS for any discharge medications will be authorized once you are discharged, as it is imperative that you return to your primary care physician (or establish a relationship with a primary care physician if you do not have  one) for your post hospital discharge needs so that they can reassess your need for medications and monitor your lab values.    Time coordinating discharge: 40 minutes  SIGNED:   Shelly Coss, MD  [facility] 02/26/2022, 11:08 AM Pager [phone]  If 7PM-7AM, please contact night-coverage [email or web address] Password [email or web address]

## 2022-02-28 DIAGNOSIS — I959 Hypotension, unspecified: Secondary | ICD-10-CM | POA: Diagnosis not present

## 2022-02-28 DIAGNOSIS — I480 Paroxysmal atrial fibrillation: Secondary | ICD-10-CM | POA: Diagnosis not present

## 2022-02-28 DIAGNOSIS — E7800 Pure hypercholesterolemia, unspecified: Secondary | ICD-10-CM | POA: Diagnosis not present

## 2022-02-28 DIAGNOSIS — Z9181 History of falling: Secondary | ICD-10-CM | POA: Diagnosis not present

## 2022-02-28 DIAGNOSIS — Z96641 Presence of right artificial hip joint: Secondary | ICD-10-CM | POA: Diagnosis not present

## 2022-02-28 DIAGNOSIS — Z7901 Long term (current) use of anticoagulants: Secondary | ICD-10-CM | POA: Diagnosis not present

## 2022-02-28 DIAGNOSIS — E559 Vitamin D deficiency, unspecified: Secondary | ICD-10-CM | POA: Diagnosis not present

## 2022-02-28 DIAGNOSIS — S0181XD Laceration without foreign body of other part of head, subsequent encounter: Secondary | ICD-10-CM | POA: Diagnosis not present

## 2022-02-28 DIAGNOSIS — R42 Dizziness and giddiness: Secondary | ICD-10-CM | POA: Diagnosis not present

## 2022-02-28 DIAGNOSIS — R55 Syncope and collapse: Secondary | ICD-10-CM | POA: Diagnosis not present

## 2022-03-03 DIAGNOSIS — R55 Syncope and collapse: Secondary | ICD-10-CM | POA: Diagnosis not present

## 2022-03-03 DIAGNOSIS — D6869 Other thrombophilia: Secondary | ICD-10-CM | POA: Diagnosis not present

## 2022-03-03 DIAGNOSIS — Z0000 Encounter for general adult medical examination without abnormal findings: Secondary | ICD-10-CM | POA: Diagnosis not present

## 2022-03-03 DIAGNOSIS — I480 Paroxysmal atrial fibrillation: Secondary | ICD-10-CM | POA: Diagnosis not present

## 2022-03-03 DIAGNOSIS — Z1331 Encounter for screening for depression: Secondary | ICD-10-CM | POA: Diagnosis not present

## 2022-03-05 DIAGNOSIS — R42 Dizziness and giddiness: Secondary | ICD-10-CM | POA: Diagnosis not present

## 2022-03-05 DIAGNOSIS — E7800 Pure hypercholesterolemia, unspecified: Secondary | ICD-10-CM | POA: Diagnosis not present

## 2022-03-05 DIAGNOSIS — I480 Paroxysmal atrial fibrillation: Secondary | ICD-10-CM | POA: Diagnosis not present

## 2022-03-05 DIAGNOSIS — R55 Syncope and collapse: Secondary | ICD-10-CM | POA: Diagnosis not present

## 2022-03-05 DIAGNOSIS — S0181XD Laceration without foreign body of other part of head, subsequent encounter: Secondary | ICD-10-CM | POA: Diagnosis not present

## 2022-03-05 DIAGNOSIS — I959 Hypotension, unspecified: Secondary | ICD-10-CM | POA: Diagnosis not present

## 2022-03-10 DIAGNOSIS — S0181XD Laceration without foreign body of other part of head, subsequent encounter: Secondary | ICD-10-CM | POA: Diagnosis not present

## 2022-03-10 DIAGNOSIS — R42 Dizziness and giddiness: Secondary | ICD-10-CM | POA: Diagnosis not present

## 2022-03-10 DIAGNOSIS — I959 Hypotension, unspecified: Secondary | ICD-10-CM | POA: Diagnosis not present

## 2022-03-10 DIAGNOSIS — E7800 Pure hypercholesterolemia, unspecified: Secondary | ICD-10-CM | POA: Diagnosis not present

## 2022-03-10 DIAGNOSIS — R55 Syncope and collapse: Secondary | ICD-10-CM | POA: Diagnosis not present

## 2022-03-10 DIAGNOSIS — I480 Paroxysmal atrial fibrillation: Secondary | ICD-10-CM | POA: Diagnosis not present

## 2022-03-16 DIAGNOSIS — E7800 Pure hypercholesterolemia, unspecified: Secondary | ICD-10-CM | POA: Diagnosis not present

## 2022-03-16 DIAGNOSIS — R42 Dizziness and giddiness: Secondary | ICD-10-CM | POA: Diagnosis not present

## 2022-03-16 DIAGNOSIS — R55 Syncope and collapse: Secondary | ICD-10-CM | POA: Diagnosis not present

## 2022-03-16 DIAGNOSIS — I959 Hypotension, unspecified: Secondary | ICD-10-CM | POA: Diagnosis not present

## 2022-03-16 DIAGNOSIS — I480 Paroxysmal atrial fibrillation: Secondary | ICD-10-CM | POA: Diagnosis not present

## 2022-03-16 DIAGNOSIS — S0181XD Laceration without foreign body of other part of head, subsequent encounter: Secondary | ICD-10-CM | POA: Diagnosis not present

## 2022-03-26 DIAGNOSIS — S0181XD Laceration without foreign body of other part of head, subsequent encounter: Secondary | ICD-10-CM | POA: Diagnosis not present

## 2022-03-26 DIAGNOSIS — E7800 Pure hypercholesterolemia, unspecified: Secondary | ICD-10-CM | POA: Diagnosis not present

## 2022-03-26 DIAGNOSIS — R42 Dizziness and giddiness: Secondary | ICD-10-CM | POA: Diagnosis not present

## 2022-03-26 DIAGNOSIS — I959 Hypotension, unspecified: Secondary | ICD-10-CM | POA: Diagnosis not present

## 2022-03-26 DIAGNOSIS — I480 Paroxysmal atrial fibrillation: Secondary | ICD-10-CM | POA: Diagnosis not present

## 2022-03-26 DIAGNOSIS — R55 Syncope and collapse: Secondary | ICD-10-CM | POA: Diagnosis not present

## 2022-03-30 DIAGNOSIS — Z96641 Presence of right artificial hip joint: Secondary | ICD-10-CM | POA: Diagnosis not present

## 2022-03-30 DIAGNOSIS — E7800 Pure hypercholesterolemia, unspecified: Secondary | ICD-10-CM | POA: Diagnosis not present

## 2022-03-30 DIAGNOSIS — I959 Hypotension, unspecified: Secondary | ICD-10-CM | POA: Diagnosis not present

## 2022-03-30 DIAGNOSIS — Z7901 Long term (current) use of anticoagulants: Secondary | ICD-10-CM | POA: Diagnosis not present

## 2022-03-30 DIAGNOSIS — E559 Vitamin D deficiency, unspecified: Secondary | ICD-10-CM | POA: Diagnosis not present

## 2022-03-30 DIAGNOSIS — S0181XD Laceration without foreign body of other part of head, subsequent encounter: Secondary | ICD-10-CM | POA: Diagnosis not present

## 2022-03-30 DIAGNOSIS — Z9181 History of falling: Secondary | ICD-10-CM | POA: Diagnosis not present

## 2022-03-30 DIAGNOSIS — I480 Paroxysmal atrial fibrillation: Secondary | ICD-10-CM | POA: Diagnosis not present

## 2022-03-30 DIAGNOSIS — R55 Syncope and collapse: Secondary | ICD-10-CM | POA: Diagnosis not present

## 2022-03-30 DIAGNOSIS — R42 Dizziness and giddiness: Secondary | ICD-10-CM | POA: Diagnosis not present

## 2022-04-03 DIAGNOSIS — E7800 Pure hypercholesterolemia, unspecified: Secondary | ICD-10-CM | POA: Diagnosis not present

## 2022-04-03 DIAGNOSIS — S0181XD Laceration without foreign body of other part of head, subsequent encounter: Secondary | ICD-10-CM | POA: Diagnosis not present

## 2022-04-03 DIAGNOSIS — R42 Dizziness and giddiness: Secondary | ICD-10-CM | POA: Diagnosis not present

## 2022-04-03 DIAGNOSIS — I959 Hypotension, unspecified: Secondary | ICD-10-CM | POA: Diagnosis not present

## 2022-04-03 DIAGNOSIS — I480 Paroxysmal atrial fibrillation: Secondary | ICD-10-CM | POA: Diagnosis not present

## 2022-04-03 DIAGNOSIS — R55 Syncope and collapse: Secondary | ICD-10-CM | POA: Diagnosis not present

## 2022-04-15 DIAGNOSIS — I959 Hypotension, unspecified: Secondary | ICD-10-CM | POA: Diagnosis not present

## 2022-04-15 DIAGNOSIS — R55 Syncope and collapse: Secondary | ICD-10-CM | POA: Diagnosis not present

## 2022-04-15 DIAGNOSIS — S0181XD Laceration without foreign body of other part of head, subsequent encounter: Secondary | ICD-10-CM | POA: Diagnosis not present

## 2022-04-15 DIAGNOSIS — I480 Paroxysmal atrial fibrillation: Secondary | ICD-10-CM | POA: Diagnosis not present

## 2022-04-15 DIAGNOSIS — R42 Dizziness and giddiness: Secondary | ICD-10-CM | POA: Diagnosis not present

## 2022-04-15 DIAGNOSIS — E7800 Pure hypercholesterolemia, unspecified: Secondary | ICD-10-CM | POA: Diagnosis not present

## 2022-04-23 ENCOUNTER — Other Ambulatory Visit: Payer: Medicare Other

## 2022-04-23 DIAGNOSIS — I7121 Aneurysm of the ascending aorta, without rupture: Secondary | ICD-10-CM | POA: Diagnosis not present

## 2022-04-23 DIAGNOSIS — Z01812 Encounter for preprocedural laboratory examination: Secondary | ICD-10-CM

## 2022-04-24 LAB — BASIC METABOLIC PANEL
BUN/Creatinine Ratio: 26 — ABNORMAL HIGH (ref 10–24)
BUN: 23 mg/dL (ref 8–27)
CO2: 25 mmol/L (ref 20–29)
Calcium: 9.1 mg/dL (ref 8.6–10.2)
Chloride: 103 mmol/L (ref 96–106)
Creatinine, Ser: 0.9 mg/dL (ref 0.76–1.27)
Glucose: 103 mg/dL — ABNORMAL HIGH (ref 70–99)
Potassium: 4.4 mmol/L (ref 3.5–5.2)
Sodium: 143 mmol/L (ref 134–144)
eGFR: 82 mL/min/{1.73_m2} (ref >59)

## 2022-04-30 ENCOUNTER — Ambulatory Visit (HOSPITAL_BASED_OUTPATIENT_CLINIC_OR_DEPARTMENT_OTHER)
Admission: RE | Admit: 2022-04-30 | Discharge: 2022-04-30 | Disposition: A | Discharge status: Home / Self Care | Payer: Medicare Other | Source: Ambulatory Visit | Attending: Cardiology | Admitting: Cardiology

## 2022-04-30 ENCOUNTER — Encounter (HOSPITAL_BASED_OUTPATIENT_CLINIC_OR_DEPARTMENT_OTHER): Payer: Self-pay

## 2022-04-30 DIAGNOSIS — I7121 Aneurysm of the ascending aorta, without rupture: Secondary | ICD-10-CM | POA: Insufficient documentation

## 2022-04-30 MED ORDER — IOHEXOL 350 MG/ML SOLN
100.0000 mL | Freq: Once | INTRAVENOUS | Status: AC | PRN
  Administered 2022-04-30: 75 mL via INTRAVENOUS

## 2022-05-01 ENCOUNTER — Telehealth: Payer: Self-pay

## 2022-05-01 DIAGNOSIS — I7121 Aneurysm of the ascending aorta, without rupture: Secondary | ICD-10-CM

## 2022-05-01 NOTE — Telephone Encounter (Signed)
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  Candee Furbish, MD

## 2022-05-01 NOTE — Telephone Encounter (Signed)
The patient has been notified of the result and verbalized understanding.  All questions (if any) were answered. Antonieta Iba, RN 05/01/2022 11:00 AM

## 2022-05-05 ENCOUNTER — Encounter: Payer: Self-pay | Admitting: Cardiology

## 2022-05-05 ENCOUNTER — Ambulatory Visit (INDEPENDENT_AMBULATORY_CARE_PROVIDER_SITE_OTHER): Payer: Medicare Other | Admitting: Cardiology

## 2022-05-05 VITALS — BP 116/70 | HR 66 | Ht 71.0 in | Wt 157.0 lb

## 2022-05-05 DIAGNOSIS — I480 Paroxysmal atrial fibrillation: Secondary | ICD-10-CM

## 2022-05-05 DIAGNOSIS — I7121 Aneurysm of the ascending aorta, without rupture: Secondary | ICD-10-CM | POA: Diagnosis not present

## 2022-05-05 NOTE — Progress Notes (Signed)
Cardiology Office Note:    Date:  05/05/2022   ID:  Gary Rice, DOB April 10, 1931, MRN [id number]  PCP:  Gary Huddle, MD   [facility] Providers Cardiologist:  Gary Furbish, MD     Referring MD: Gary Huddle, MD    History of Present Illness:    Gary Rice is a 88 y.o. male follow-up atrial fibrillation hyperlipidemia and ascending aortic aneurysm 4.8 cm found August 2022 as well as recent hospitalization with syncopal episode likely hypotensive.  See below for details.  Feeling better.  He does cough up phlegm in the morning.  He states.  Echo 05/04/21: Sonographer Comments: Technically d feeling better.  Ifficult study due to poor echo  windows.  IMPRESSIONS    1. Left ventricular ejection fraction, by estimation, is 60 to 65%. The  left ventricle has normal function. The left ventricle has no regional  wall motion abnormalities. There is mild left ventricular hypertrophy.  Left ventricular diastolic parameters  are indeterminate.   2. Right ventricular systolic function is normal. The right ventricular  size is normal. Tricuspid regurgitation signal is inadequate for assessing  PA pressure.   3. The mitral valve is degenerative. Trivial mitral valve regurgitation.  No evidence of mitral stenosis.   4. The aortic valve is grossly normal. There is mild thickening of the  aortic valve. Aortic valve regurgitation is trivial. No aortic stenosis is  present.   5. The inferior vena cava is dilated in size with >50% respiratory  variability, suggesting right atrial pressure of 8 mmHg.    CTA Chest 05/02/2021: FINDINGS: CTA CHEST FINDINGS   Cardiovascular: Satisfactory opacification of the pulmonary arteries to the segmental level. No evidence of pulmonary embolism. Mild aortic atherosclerosis. Aneurysmal dilatation of the ascending aorta up to 4.8 cm. Normal cardiac size. Mild coronary vascular calcification. No pericardial effusion  Past Medical History:   Diagnosis Date   Astigmatism    Basal cell carcinoma    Inguinal hernia    bilateral   Macular degeneration, age related, nonexudative    bilateral   SVT (supraventricular tachycardia) ([place])     Past Surgical History:  Procedure Laterality Date   APPENDECTOMY     age 75   BOWEL RESECTION  05/09/2021   Procedure: SMALL BOWEL RESECTION;  Surgeon: Rolm Bookbinder, MD;  Location: [place];  Service: General;;   CATARACT EXTRACTION, BILATERAL     HERNIA REPAIR     RIH   INGUINAL HERNIA REPAIR Right 05/02/2021   Procedure: REPAIR  INCARCERATED INGUINAL HERNIA  WITH MESH;  Surgeon: Jesusita Oka, MD;  Location: [place];  Service: General;  Laterality: Right;   JOINT REPLACEMENT Right    RTHA   LAPAROSCOPY N/A 05/09/2021   Procedure: LAPAROSCOPY DIAGNOSTIC;  Surgeon: Rolm Bookbinder, MD;  Location: [place];  Service: General;  Laterality: N/A;   LAPAROTOMY  05/09/2021   Procedure: EXPLORATORY LAPAROTOMY;  Surgeon: Rolm Bookbinder, MD;  Location: [place];  Service: General;;   TONSILLECTOMY      Current Medications: Current Meds  Medication Sig   apixaban (ELIQUIS) 5 MG TABS tablet Take 1 tablet (5 mg total) by mouth 2 (two) times daily.   atorvastatin (LIPITOR) 10 MG tablet Take 10 mg by mouth at bedtime.   Cholecalciferol (VITAMIN D3) 50 MCG (2000 UT) TABS Take 2,000 Units by mouth daily.   diltiazem (CARDIZEM) 30 MG tablet Take 30 mg by mouth daily as needed ("for blood pressure and/or chest pain").   docusate  sodium (COLACE) 100 MG capsule Take 1 capsule (100 mg total) by mouth 2 (two) times daily. Surfactant   meclizine (ANTIVERT) 12.5 MG tablet Take 1 tablet (12.5 mg total) by mouth 3 (three) times daily as needed for dizziness.   Multiple Vitamin (MULTIVITAMIN WITH MINERALS) TABS tablet Take 1 tablet by mouth daily with breakfast.   Multiple Vitamins-Minerals (PRESERVISION AREDS 2) CAPS Take 1 capsule by mouth in the morning and at bedtime.   pantoprazole (PROTONIX) 40 MG  tablet Take 1 tablet (40 mg total) by mouth daily.   polyethylene glycol (MIRALAX / GLYCOLAX) 17 g packet Take 17 g by mouth daily. Stool softener   psyllium (METAMUCIL) 58.6 % packet Take 1 packet by mouth daily as needed (for constipation).   VITAMIN E PO Take 1 capsule by mouth daily.      Allergies:   Patient has no known allergies.   Social History   Socioeconomic History   Marital status: Married    Spouse name: Not on file   Number of children: Not on file   Years of education: Not on file   Highest education level: Not on file  Occupational History   Not on file  Tobacco Use   Smoking status: Former    Types: Cigarettes    Quit date: 10/24/1950    Years since quitting: 71.5   Smokeless tobacco: Never  Vaping Use   Vaping Use: Never used  Substance and Sexual Activity   Alcohol use: No   Drug use: No   Sexual activity: Not on file  Other Topics Concern   Not on file  Social History Narrative   Not on file   Social Determinants of Health   Financial Resource Strain: Not on file  Food Insecurity: Not on file  Transportation Needs: Not on file  Physical Activity: Not on file  Stress: Not on file  Social Connections: Not on file     Family History: The patient's family history includes Heart attack in his father and son; Heart disease in his father; Sleep apnea in his son.  ROS:   Please see the history of present illness.     All other systems reviewed and are negative.  EKGs/Labs/Other Studies Reviewed:    The following studies were reviewed today: Hosp records  05/04/21:ECHO    1. Left ventricular ejection fraction, by estimation, is 60 to 65%. The  left ventricle has normal function. The left ventricle has no regional  wall motion abnormalities. There is mild left ventricular hypertrophy.  Left ventricular diastolic parameters  are indeterminate.   2. Right ventricular systolic function is normal. The right ventricular  size is normal. Tricuspid  regurgitation signal is inadequate for assessing  PA pressure.   3. The mitral valve is degenerative. Trivial mitral valve regurgitation.  No evidence of mitral stenosis.   4. The aortic valve is grossly normal. There is mild thickening of the  aortic valve. Aortic valve regurgitation is trivial. No aortic stenosis is  present.   5. The inferior vena cava is dilated in size with >50% respiratory  variability, suggesting right atrial pressure of 8 mmHg.    Recent Labs: 02/23/2022: ALT 25; Magnesium 2.0; TSH 4.342 02/24/2022: Hemoglobin 12.9; Platelets 248 04/23/2022: BUN 23; Creatinine, Ser 0.90; Potassium 4.4; Sodium 143  Recent Lipid Panel    Component Value Date/Time   TRIG 86 05/12/2021 0329     Risk Assessment/Calculations:  Physical Exam:    VS:  BP 116/70 (BP Location: Left Arm, Patient Position: Sitting, Cuff Size: Normal)   Pulse 66   Ht '5\' 11"'$  (1.803 m)   Wt 157 lb (71.2 kg)   SpO2 96%   BMI 21.90 kg/m     Wt Readings from Last 3 Encounters:  05/05/22 157 lb (71.2 kg)  02/26/22 157 lb 3 oz (71.3 kg)  10/23/21 162 lb (73.5 kg)     GEN:  Well nourished, well developed in no acute distress HEENT: Normal NECK: No JVD; No carotid bruits LYMPHATICS: No lymphadenopathy CARDIAC: RRR, no murmurs, no rubs, gallops RESPIRATORY:  Clear to auscultation without rales, wheezing or rhonchi  ABDOMEN: Soft, non-tender, non-distended MUSCULOSKELETAL:  No edema; No deformity  SKIN: Warm and dry NEUROLOGIC:  Alert and oriented x 3 PSYCHIATRIC:  Normal affect   ASSESSMENT:    1. Aneurysm of ascending aorta without rupture (HCC)   2. Paroxysmal atrial fibrillation (HCC)    PLAN:    In order of problems listed above:  Syncopal episode: Happened while working in the kitchen.  Fell backward, hit his head.  BP was 82/58. Labile.  CT head, cervical spine negative for acute findings.  Chest x-ray negative.  EKG did not show any abnormal findings. Last echo on  8/27 showed EF of 60 to 65%, indeterminate diastolic parameters.  It may have been transiently low blood pressure.  No adverse arrhythmias in hospital.  Liberalize fluids and salt.  Warning signs, sit down.  Atrial fibrillation with rapid ventricular response (HCC) Postop atrial fibrillation placed on Eliquis 5 mg twice a day.  Currently controlled on diltiazem as needed 30 mg.  Paroxysmal.  Ejection fraction 65%.  No changes made.   Thoracic aortic aneurysm 4.8 cm ascending thoracic aortic aneurysm noted on  CT scan 05/02/2021.  Repeat CT scan 04/30/22 Grossly stable 4.7 cm ascending thoracic aortic  We discussed surgical threshold for repair at 5.5 cm.  Continue with surveillance.  Obviously if severe chest pain were to occur, call 911.  Continue with optimal blood pressure control   Hyperlipidemia On atorvastatin 10 mg a day.  No myalgias.  No changes     Supraventricular tachycardia - Possibly 3 separate episodes, main one was the day of memorial for son.  Flutter-like sensation.  Can take diltiazem as needed.  EP if necessary. Relatively asymptomatic. SVT resolved in ambulance. Not aware of any other overall he is been doing quite well.  No recurrent episodes.  No changes          Medication Adjustments/Labs and Tests Ordered: Current medicines are reviewed at length with the patient today.  Concerns regarding medicines are outlined above.  No orders of the defined types were placed in this encounter.  No orders of the defined types were placed in this encounter.   Patient Instructions  Medication Instructions:  The current medical regimen is effective;  continue present plan and medications.  *If you need a refill on your cardiac medications before your next appointment, please call your pharmacy*  Follow-Up: At [facility], you and your health needs are our priority.  As part of our continuing mission to provide you with exceptional heart care, we have created designated  Provider Care Teams.  These Care Teams include your primary Cardiologist (physician) and Advanced Practice Providers (APPs -  Physician Assistants and Nurse Practitioners) who all work together to provide you with the care you need, when you need it.  We recommend signing  up for the patient portal called "MyChart".  Sign up information is provided on this After Visit Summary.  MyChart is used to connect with patients for Virtual Visits (Telemedicine).  Patients are able to view lab/test results, encounter notes, upcoming appointments, etc.  Non-urgent messages can be sent to your provider as well.   To learn more about what you can do with MyChart, go to [email or web address].    Your next appointment:   6 month(s)  The format for your next appointment:   In Person  Provider:   Robbie Lis, PA-C, Nicholes Rough, PA-C, Melina Copa, PA-C, Ambrose Pancoast, NP, Ermalinda Barrios, PA-C, Christen Bame, NP, or Richardson Dopp, PA-C     {   Important Information About Sugar         Signed, Gary Furbish, MD  05/05/2022 2:04 PM    [place]

## 2022-05-05 NOTE — Patient Instructions (Signed)
Medication Instructions:  The current medical regimen is effective;  continue present plan and medications.  *If you need a refill on your cardiac medications before your next appointment, please call your pharmacy*  Follow-Up: At [facility], you and your health needs are our priority.  As part of our continuing mission to provide you with exceptional heart care, we have created designated Provider Care Teams.  These Care Teams include your primary Cardiologist (physician) and Advanced Practice Providers (APPs -  Physician Assistants and Nurse Practitioners) who all work together to provide you with the care you need, when you need it.  We recommend signing up for the patient portal called "MyChart".  Sign up information is provided on this After Visit Summary.  MyChart is used to connect with patients for Virtual Visits (Telemedicine).  Patients are able to view lab/test results, encounter notes, upcoming appointments, etc.  Non-urgent messages can be sent to your provider as well.   To learn more about what you can do with MyChart, go to [email or web address].    Your next appointment:   6 month(s)  The format for your next appointment:   In Person  Provider:   Robbie Lis, PA-C, Nicholes Rough, PA-C, Melina Copa, PA-C, Ambrose Pancoast, NP, Ermalinda Barrios, PA-C, Christen Bame, NP, or Richardson Dopp, PA-C     {   Important Information About Sugar

## 2022-05-22 DIAGNOSIS — K432 Incisional hernia without obstruction or gangrene: Secondary | ICD-10-CM | POA: Diagnosis not present

## 2022-07-07 DIAGNOSIS — Z23 Encounter for immunization: Secondary | ICD-10-CM | POA: Diagnosis not present

## 2022-08-02 DIAGNOSIS — Z23 Encounter for immunization: Secondary | ICD-10-CM | POA: Diagnosis not present

## 2022-08-08 IMAGING — CT CT HEAD W/O CM
4 series · 14 of 47 positions shown, 16 images · non-contrast
Comparison: None Available.

CLINICAL DATA: Moderate to severe fall with head and neck injury.
Taking Eliquis.



[Series 3: head without ax · axial · non-contrast · 0.45mm/px · z∈[-120,-4]mm · 6 of 33 slices shown, 8 images]
[im 5/33  brain]
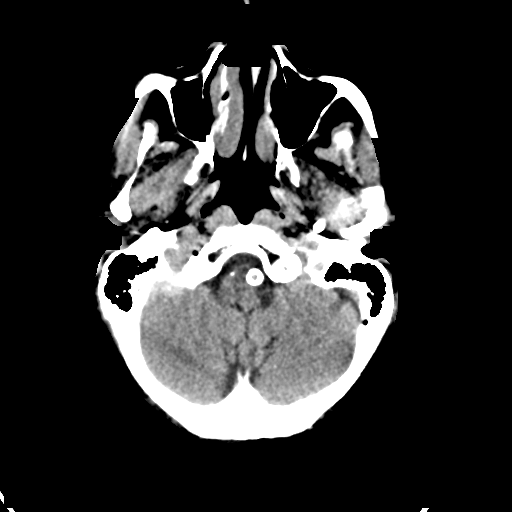
[im 5/33  bone]
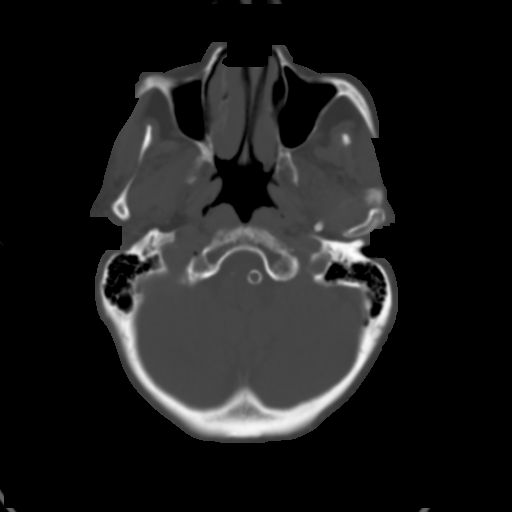
[im 10/33  brain]
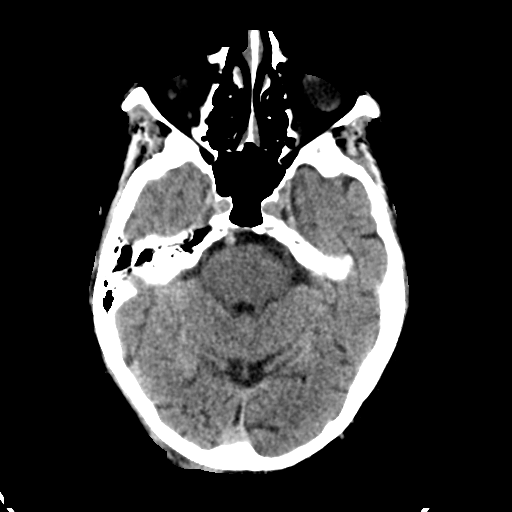
[im 14/33  brain]
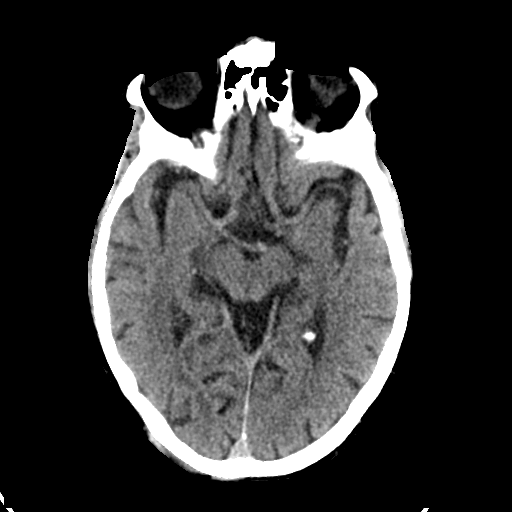
[im 19/33  brain]
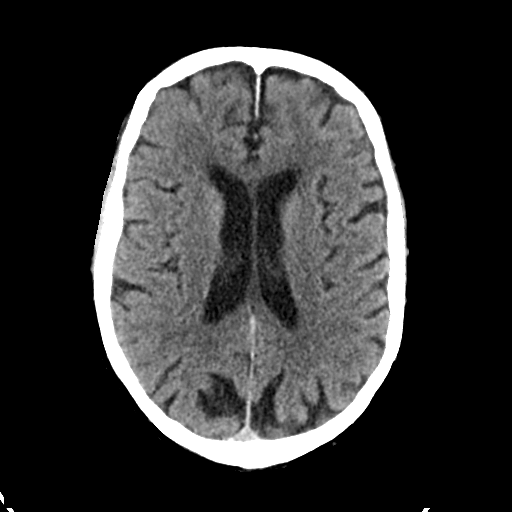
[im 23/33  brain]
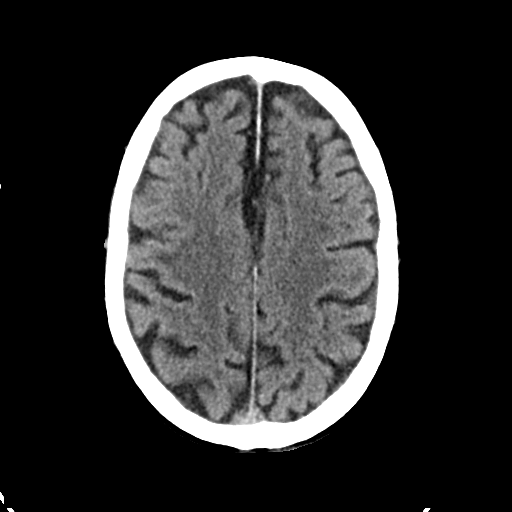
[im 23/33  bone]
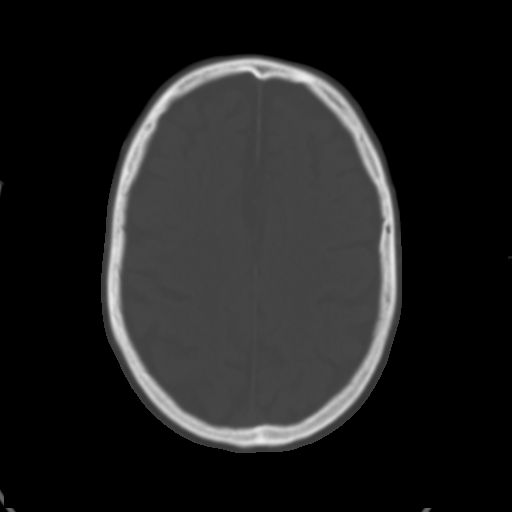
[im 28/33  brain]
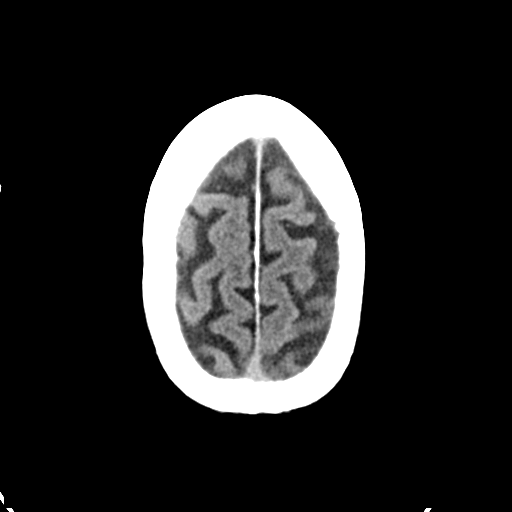

[Series 4: head bone · axial · 0.45mm/px · z∈[-128,-112]mm · 2 of 86 slices shown]
[im 9/86  bone]
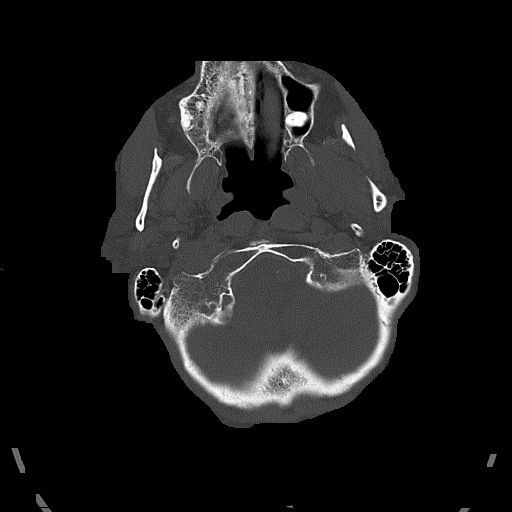
[im 17/86  bone]
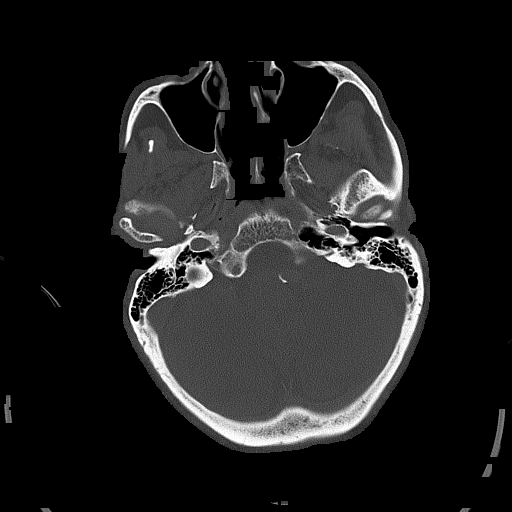

[Series 5: head without cor · coronal · non-contrast · 0.31mm/px · 3 of 78 slices shown]
[im 26/78  brain]
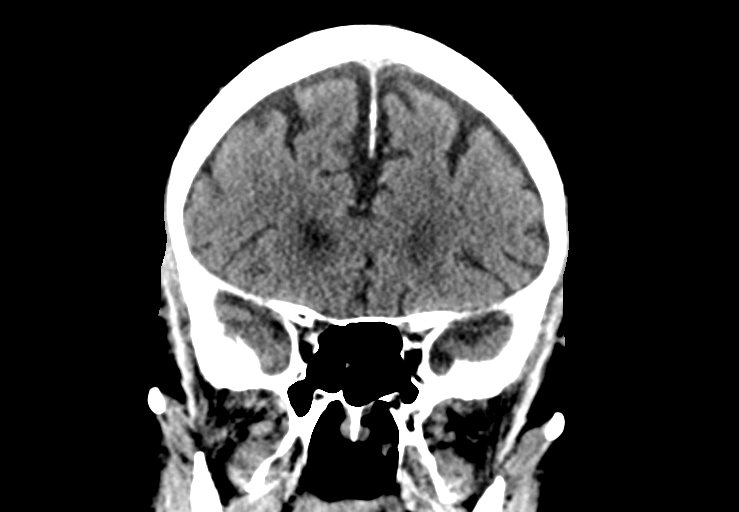
[im 35/78  brain]
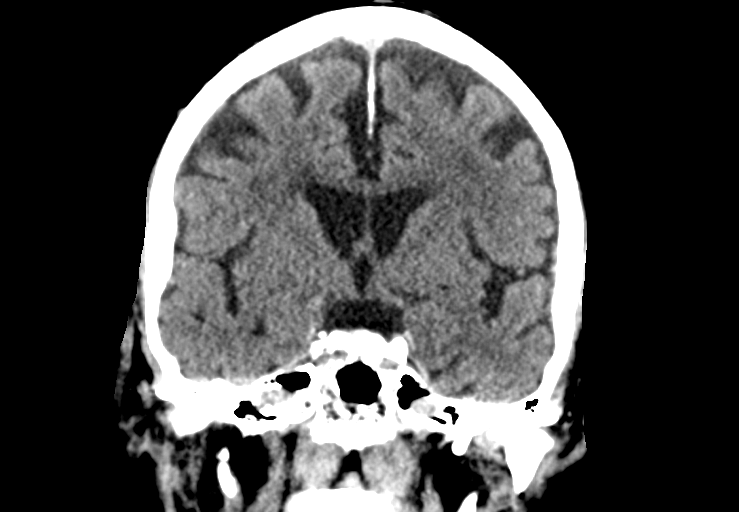
[im 43/78  brain]
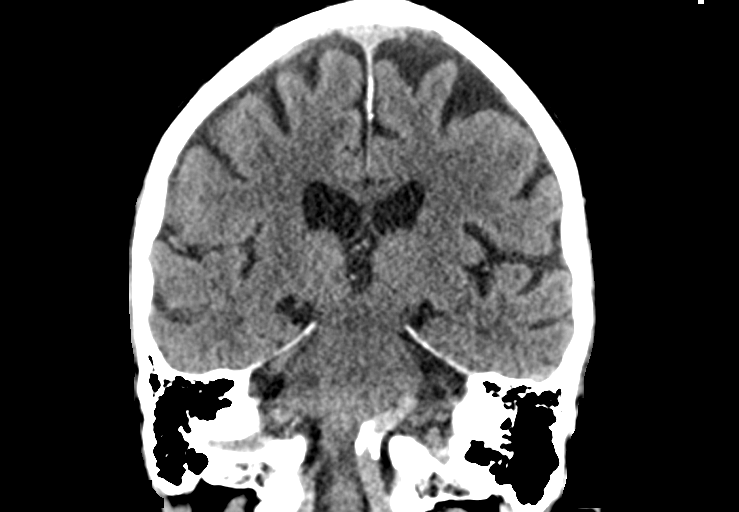

[Series 6: head without sag · sagittal · non-contrast · 0.31mm/px · 3 of 78 slices shown]
[im 26/78  brain]
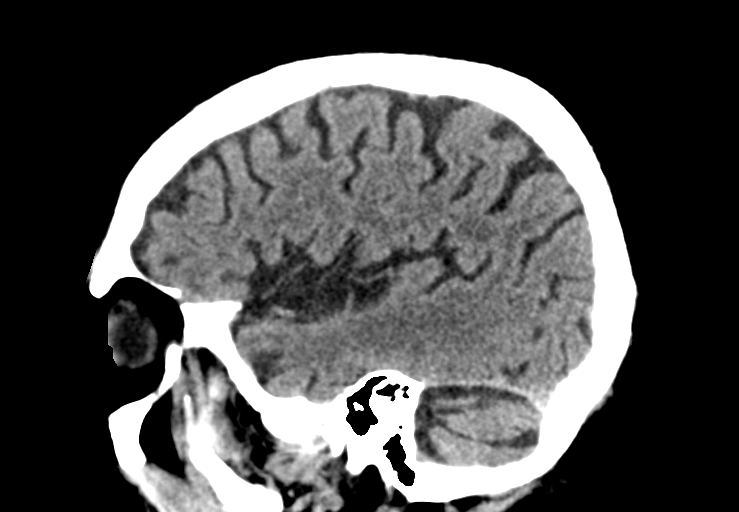
[im 39/78  brain]
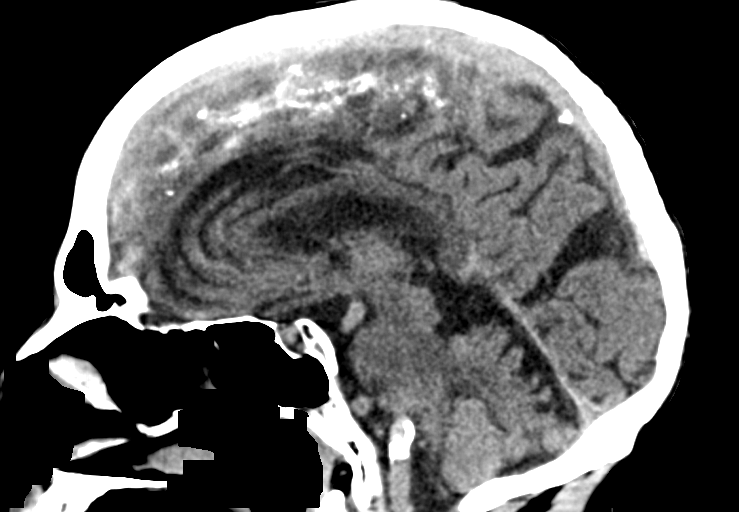
[im 52/78  brain]
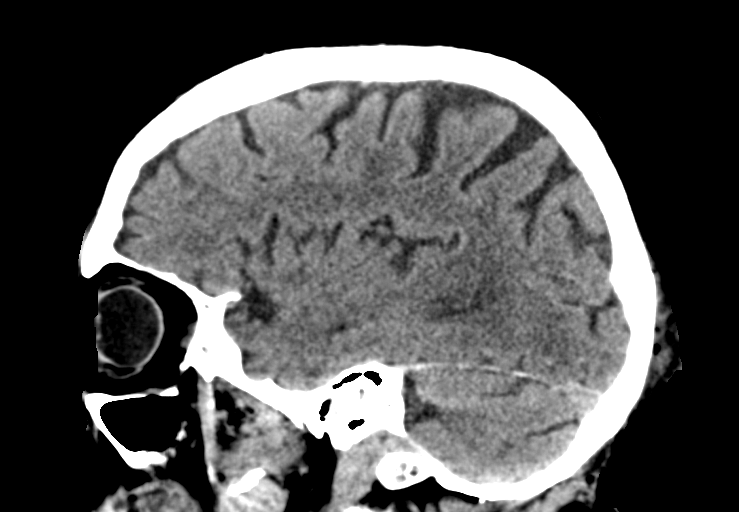

[14 of 47 positions shown; findings below may reference images not displayed]

FINDINGS: CT HEAD FINDINGS

Brain: Mildly enlarged ventricles and cortical sulci. Minimal patchy
white matter low density in both cerebral hemispheres. Right basal
ganglia probable prominent perivascular space. No intracranial
hemorrhage, mass lesion or CT evidence of acute infarction.

Vascular: No hyperdense vessel or unexpected calcification.

Skull: Normal. Negative for fracture or focal lesion.

Sinuses/Orbits: Status post bilateral cataract extraction.
Unremarkable bones and included paranasal sinuses.

Other: None.

CT CERVICAL SPINE FINDINGS

Alignment: Grade 1 anterolisthesis at the C3-4 level. Grade 1
retrolisthesis at the C4-5 and C5-6 levels.

Skull base and vertebrae: No acute fracture. No primary bone lesion
or focal pathologic process.

Soft tissues and spinal canal: No prevertebral fluid or swelling. No
visible canal hematoma.

Disc levels: Multilevel degenerative changes, including facet
degenerative changes throughout the cervical spine.

Upper chest: Clear lung apices.

Other: None.
IMPRESSION: 1. No skull fracture or intracranial hemorrhage.
2. No cervical spine fracture or traumatic subluxation.
3. Mild diffuse cerebral and cerebellar atrophy.
4. Minimal chronic small vessel white matter ischemic changes in
both cerebral hemispheres.
5. Extensive cervical spine degenerative changes.

## 2022-08-08 IMAGING — DX DG HIP (WITH OR WITHOUT PELVIS) 2-3V*R*
3 series · 3 of 3 positions shown · non-contrast
Comparison: None Available.

CLINICAL DATA: Fell

EXAM:
DG HIP (WITH OR WITHOUT PELVIS) 2-3V RIGHT

[pelvis ap]
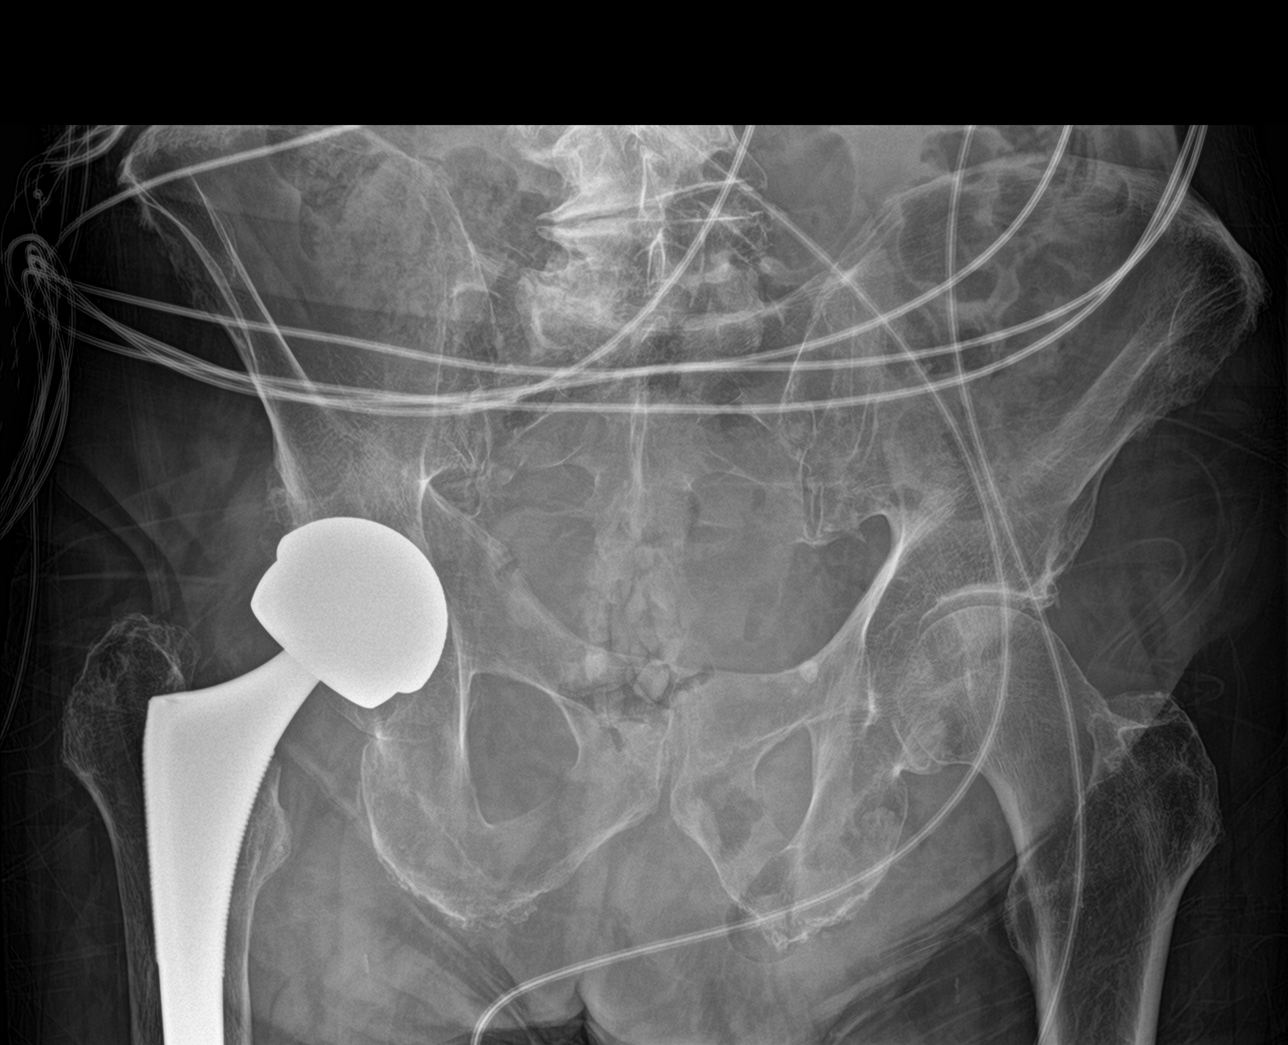

[hip lat]
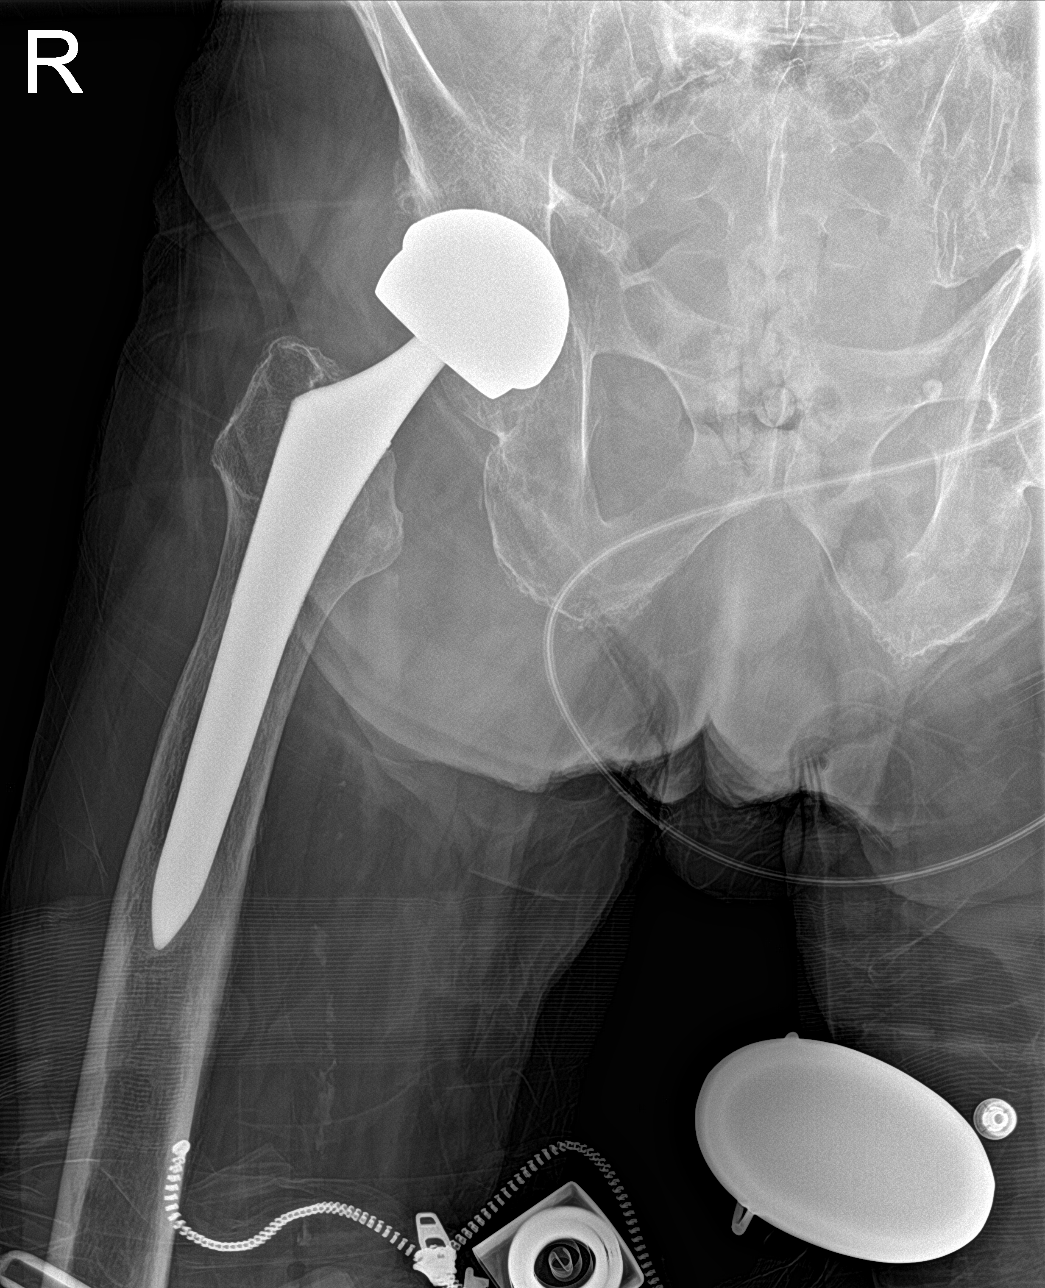

[hip ap]
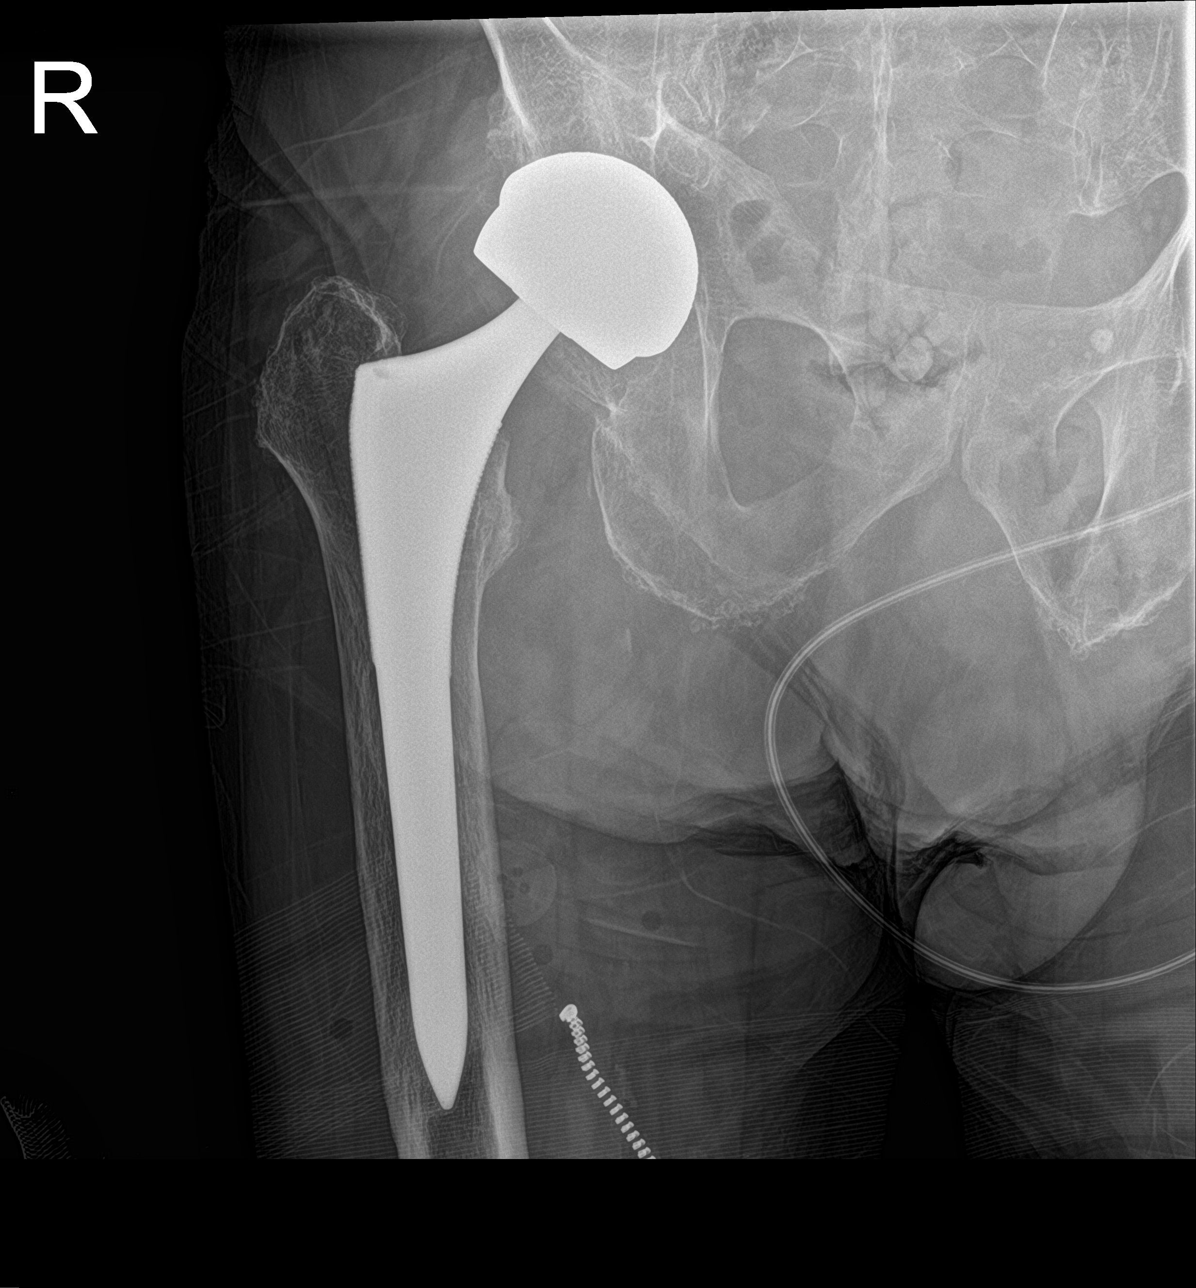

[3 of 3 positions shown; findings below may reference images not displayed]

FINDINGS: Frontal view of the pelvis as well as frontal and frogleg lateral
views of the right hip are obtained. Right hip arthroplasty is
identified in the expected position with no signs of acute
complication. Bones are diffusely osteopenic. No acute displaced
fracture. Mild left hip osteoarthritis. Significant lower lumbar
spondylosis and facet hypertrophy.
IMPRESSION: 1. No acute displaced fracture.
2. Unremarkable right hip arthroplasty.
3. Mild left hip osteoarthritis.

## 2022-08-08 IMAGING — DX DG CHEST 1V PORT
1 series · 1 of 1 positions shown · non-contrast
Comparison: 05/17/2021

CLINICAL DATA: Fall. Patient on blood thinners. Patient reports
losing consciousness.

EXAM:
PORTABLE CHEST 1 VIEW

[chest]
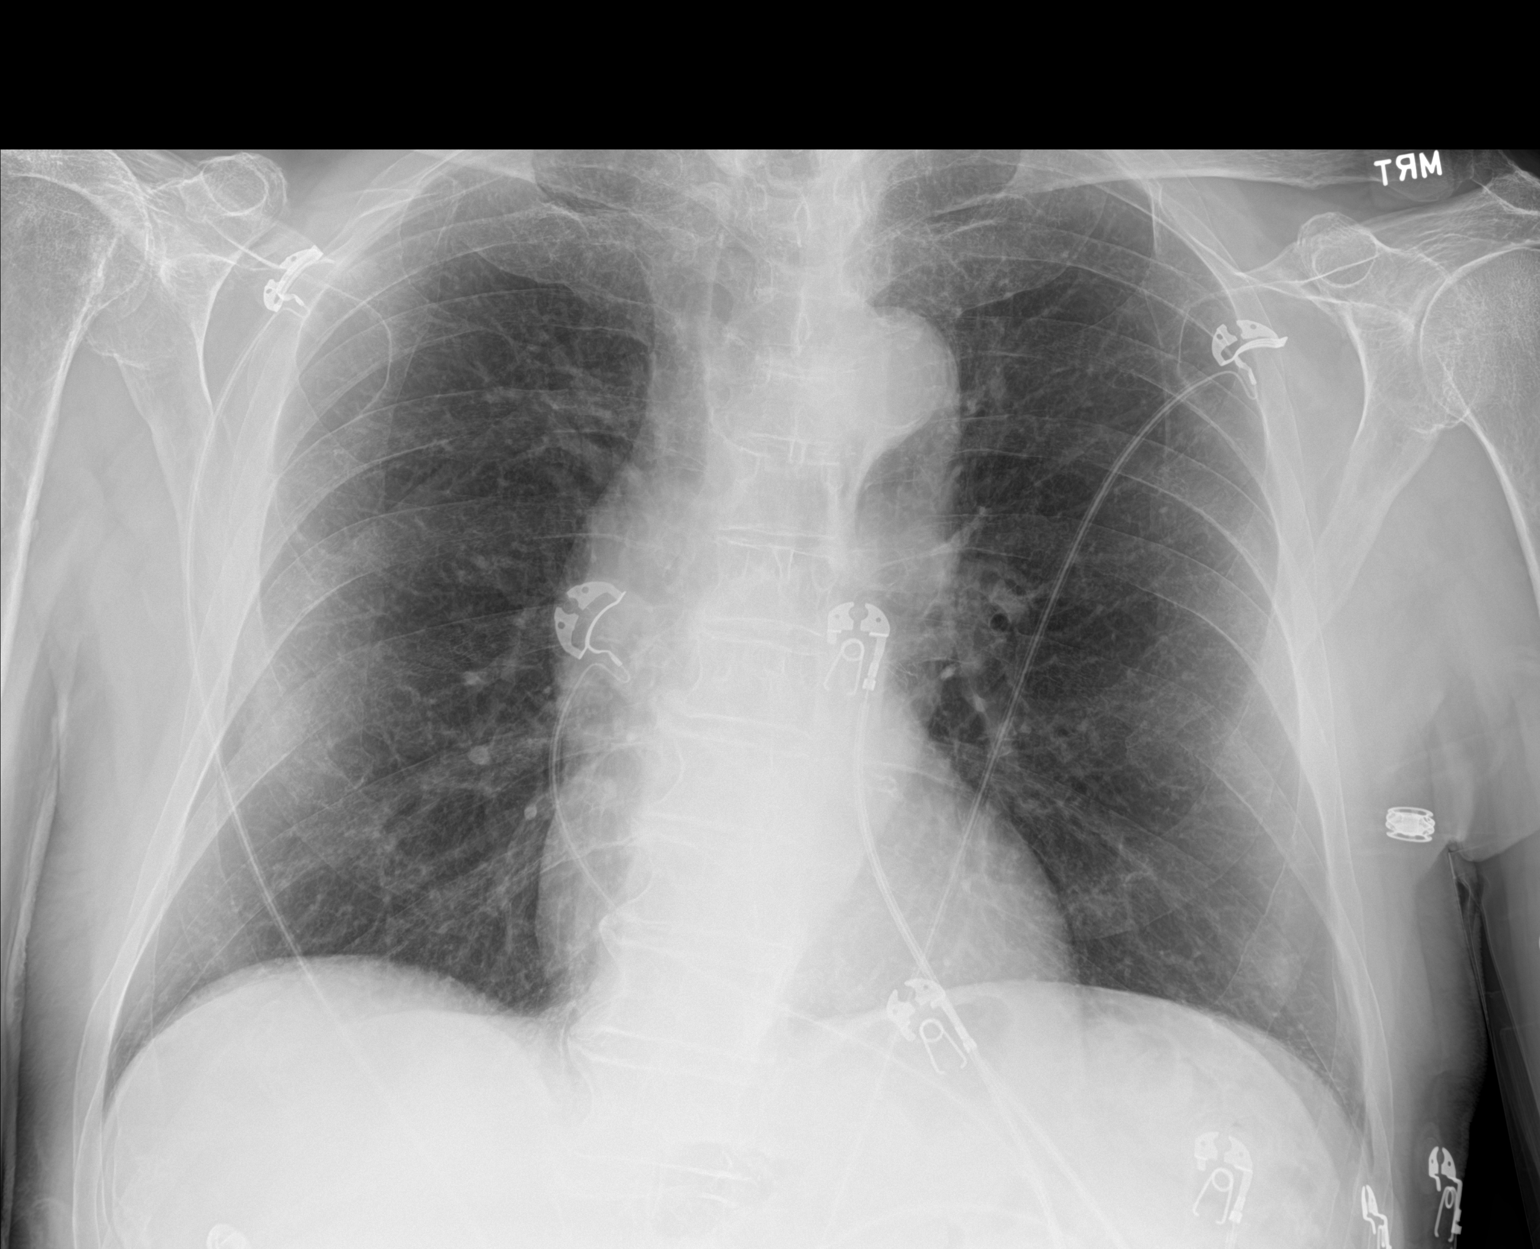

[1 of 1 positions shown; findings below may reference images not displayed]

FINDINGS: Cardiac silhouette is normal in size. No mediastinal or hilar
masses.

Clear lungs.  No pleural effusion or pneumothorax.

Skeletal structures are demineralized, grossly intact.
IMPRESSION: No active disease.

## 2022-09-02 DIAGNOSIS — I4891 Unspecified atrial fibrillation: Secondary | ICD-10-CM | POA: Diagnosis not present

## 2022-09-02 DIAGNOSIS — E785 Hyperlipidemia, unspecified: Secondary | ICD-10-CM | POA: Diagnosis not present

## 2022-09-02 DIAGNOSIS — M1990 Unspecified osteoarthritis, unspecified site: Secondary | ICD-10-CM | POA: Diagnosis not present

## 2022-10-14 DIAGNOSIS — R053 Chronic cough: Secondary | ICD-10-CM | POA: Diagnosis not present

## 2022-10-14 DIAGNOSIS — I7121 Aneurysm of the ascending aorta, without rupture: Secondary | ICD-10-CM | POA: Diagnosis not present

## 2022-10-14 DIAGNOSIS — Z7901 Long term (current) use of anticoagulants: Secondary | ICD-10-CM | POA: Diagnosis not present

## 2022-10-14 DIAGNOSIS — M1990 Unspecified osteoarthritis, unspecified site: Secondary | ICD-10-CM | POA: Diagnosis not present

## 2022-10-14 DIAGNOSIS — I4891 Unspecified atrial fibrillation: Secondary | ICD-10-CM | POA: Diagnosis not present

## 2022-11-19 ENCOUNTER — Ambulatory Visit (HOSPITAL_BASED_OUTPATIENT_CLINIC_OR_DEPARTMENT_OTHER)
Admission: RE | Admit: 2022-11-19 | Discharge: 2022-11-19 | Disposition: A | Discharge status: Home / Self Care | Payer: Medicare Other | Source: Ambulatory Visit | Attending: Cardiology | Admitting: Cardiology

## 2022-11-19 ENCOUNTER — Encounter (HOSPITAL_BASED_OUTPATIENT_CLINIC_OR_DEPARTMENT_OTHER): Payer: Self-pay

## 2022-11-19 DIAGNOSIS — I7121 Aneurysm of the ascending aorta, without rupture: Secondary | ICD-10-CM | POA: Insufficient documentation

## 2022-11-19 LAB — POCT I-STAT CREATININE: Creatinine, Ser: 0.9 mg/dL (ref 0.61–1.24)

## 2022-11-19 MED ORDER — IOHEXOL 350 MG/ML SOLN
100.0000 mL | Freq: Once | INTRAVENOUS | Status: AC | PRN
  Administered 2022-11-19: 60 mL via INTRAVENOUS

## 2022-11-29 NOTE — Progress Notes (Unsigned)
Office Visit    Patient Name: Gary Rice Date of Encounter: 11/29/2022  Primary Care Provider:  Josetta Huddle, MD Primary Cardiologist:  Candee Furbish, MD Primary Electrophysiologist: None  Chief Complaint    Gary Rice is a 89 y.o. male with PMH of atrial fibrillation (on Eliquis), SVT, trivial MR, ascending aortic aneurysm who presents today for 68-monthfollow-up of aortic aneurysm. Past Medical History    Past Medical History:  Diagnosis Date   Astigmatism    Basal cell carcinoma    Inguinal hernia    bilateral   Macular degeneration, age related, nonexudative    bilateral   SVT (supraventricular tachycardia)    Past Surgical History:  Procedure Laterality Date   APPENDECTOMY     age 89  BOWEL RESECTION  05/09/2021   Procedure: SMALL BOWEL RESECTION;  Surgeon: WRolm Bookbinder MD;  [place] [facility]  Service: General;;   CATARACT EXTRACTION, BILATERAL     HERNIA REPAIR     RIH   INGUINAL HERNIA REPAIR Right 05/02/2021   Procedure: REPAIR  INCARCERATED INGUINAL HERNIA  WITH MESH;  Surgeon: LJesusita Oka MD;  [place] [facility]  Service: General;  Laterality: Right;   JOINT REPLACEMENT Right    RTHA   LAPAROSCOPY N/A 05/09/2021   Procedure: LAPAROSCOPY DIAGNOSTIC;  Surgeon: WRolm Bookbinder MD;  [place] [facility]  Service: General;  Laterality: N/A;   LAPAROTOMY  05/09/2021   Procedure: EXPLORATORY LAPAROTOMY;  Surgeon: WRolm Bookbinder MD;  [place] [facility]  Service: General;;   TONSILLECTOMY      Allergies  No Known Allergies  History of Present Illness    Gary Rice is a 89year old male with the above mention past medical history who presents today for 614-monthollow-up of aortic aneurysm.  Gary Rice initially seen by Dr. SkMarlou Porchn 2019 when he developed atrial tachycardia that was felt to be SVT.  He was hospitalized for SBO and incarcerated hernia in 2022 and developed AF with RVR.  He was started on Eliquis and CT scan  completed that revealed 4.8 cm ascending aneurysm.  He was readmitted due to severe constipation 06/10/2021 and was seen in follow-up with no recurrence of palpitations.  He continued as needed Cardizem and Eliquis.  He was last seen by Dr. SkMarlou Porch/04/2022 following hospitalization for syncope and hypotension.  Fell backwards and struck his head and BP was 82/58.  He had CT scanning of the head and cervical spine that was negative for acute findings. EKG showed no abnormalities.  2D echo was completed showing EF of 60 to 65% with no RWMA and mild LVH with trivial MVR.  Rate control with as needed Cardizem and was tolerating Eliquis 5 mg daily.  Mr. BlSrinivasresents today for 6-36-monthllow-up with his wife.  Since last being seen in the office patient reports that he has been doing well with no new cardiac complaints.  His blood pressure today is well-controlled at 116/60 and heart rate is 70 bpm.  He is compliant with his current medications and denies any adverse reactions.  He denies any presyncope or syncopal events since his previous visit.  He recently had a surveillance CT scan completed and findings were reviewed with plan to repeat CT in 1 year.  He was recently seen by Dr. GatInda Merlino retired as his PCP and is now being seen by Dr. SchTessa Rice will contact PCP office for a copy of latest labs and  cholesterol.  He has not had any  Patient denies chest pain, palpitations, dyspnea, PND, orthopnea, nausea, vomiting, dizziness, syncope, edema, weight gain, or early satiety.   Home Medications    Current Outpatient Medications  Medication Sig Dispense Refill   apixaban (ELIQUIS) 5 MG TABS tablet Take 1 tablet (5 mg total) by mouth 2 (two) times daily. 60 tablet 0   atorvastatin (LIPITOR) 10 MG tablet Take 10 mg by mouth at bedtime.     Cholecalciferol (VITAMIN D3) 50 MCG (2000 UT) TABS Take 2,000 Units by mouth daily.     diltiazem (CARDIZEM) 30 MG tablet Take 30 mg by mouth daily as needed ("for blood  pressure and/or chest pain").     docusate sodium (COLACE) 100 MG capsule Take 1 capsule (100 mg total) by mouth 2 (two) times daily. Surfactant 10 capsule 0   meclizine (ANTIVERT) 12.5 MG tablet Take 1 tablet (12.5 mg total) by mouth 3 (three) times daily as needed for dizziness. 30 tablet 0   Multiple Vitamin (MULTIVITAMIN WITH MINERALS) TABS tablet Take 1 tablet by mouth daily with breakfast.     Multiple Vitamins-Minerals (PRESERVISION AREDS 2) CAPS Take 1 capsule by mouth in the morning and at bedtime.     pantoprazole (PROTONIX) 40 MG tablet Take 1 tablet (40 mg total) by mouth daily. 30 tablet 0   polyethylene glycol (MIRALAX / GLYCOLAX) 17 g packet Take 17 g by mouth daily. Stool softener 14 each 0   psyllium (METAMUCIL) 58.6 % packet Take 1 packet by mouth daily as needed (for constipation).     VITAMIN E PO Take 1 capsule by mouth daily.      No current facility-administered medications for this visit.     Review of Systems  Please see the history of present illness.     All other systems reviewed and are otherwise negative except as noted above.  Physical Exam    Wt Readings from Last 3 Encounters:  05/05/22 157 lb (71.2 kg)  02/26/22 157 lb 3 oz (71.3 kg)  10/23/21 162 lb (73.5 kg)   [id number] were no vitals filed for this visit.,There is no height or weight on file to calculate BMI.  Constitutional:      Appearance: Healthy appearance. Not in distress.  Neck:     Vascular: JVD normal.  Pulmonary:     Effort: Pulmonary effort is normal.     Breath sounds: No wheezing. No rales. Diminished in the bases Cardiovascular:     Normal rate. Regular rhythm. Normal S1. Normal S2.      Murmurs: There is no murmur.  Edema:    Peripheral edema absent.  Abdominal:     Palpations: Abdomen is soft non tender. There is no hepatomegaly.  Skin:    General: Skin is warm and dry.  Neurological:     General: No focal deficit present.     Mental Status: Alert and oriented to person,  place and time.     Cranial Nerves: Cranial nerves are intact.  EKG/LABS/ Recent Cardiac Studies    ECG personally reviewed by me today -none completed today  Risk Assessment/Calculations:    CHA2DS2-VASc Score =     This indicates a  % annual risk of stroke. The patient's score is based upon:            Lab Results  Component Value Date   WBC 10.2 02/24/2022   HGB 12.9 (L) 02/24/2022   HCT 37.7 (L) 02/24/2022   MCV  91.7 02/24/2022   PLT 248 02/24/2022   Lab Results  Component Value Date   CREATININE 0.90 11/19/2022   BUN 23 04/23/2022   NA 143 04/23/2022   K 4.4 04/23/2022   CL 103 04/23/2022   CO2 25 04/23/2022   Lab Results  Component Value Date   ALT 25 02/23/2022   AST 29 02/23/2022   ALKPHOS 60 02/23/2022   BILITOT 0.8 02/23/2022   Lab Results  Component Value Date   TRIG 86 05/12/2021    Lab Results  Component Value Date   HGBA1C 5.8 (H) 05/11/2021    Cardiac Studies & Procedures       ECHOCARDIOGRAM  ECHOCARDIOGRAM COMPLETE 05/04/2021  Narrative ECHOCARDIOGRAM REPORT    Patient Name:   SAHWN BODDEN Date of Exam: 05/04/2021 Medical Rec #:  [id number]          Height:       71.0 in Accession #:    [id number]         Weight:       170.0 lb Date of Birth:  Feb 08, 1931           BSA:          1.968 m Patient Age:    43 years           BP:           106/68 mmHg Patient Gender: M                  HR:           83 bpm. Exam Location:  Inpatient  Procedure: 2D Echo, Cardiac Doppler, Color Doppler and 3D Echo  Indications:    I48.91* Unspeicified atrial fibrillation  History:        Patient has prior history of Echocardiogram examinations, most recent 08/16/2017.  Sonographer:    Merrie Roof RDCS Referring Phys: [id number] [facility]   Sonographer Comments: Technically difficult study due to poor echo windows. IMPRESSIONS   1. Left ventricular ejection fraction, by estimation, is 60 to 65%. The left ventricle has normal function.  The left ventricle has no regional wall motion abnormalities. There is mild left ventricular hypertrophy. Left ventricular diastolic parameters are indeterminate. 2. Right ventricular systolic function is normal. The right ventricular size is normal. Tricuspid regurgitation signal is inadequate for assessing PA pressure. 3. The mitral valve is degenerative. Trivial mitral valve regurgitation. No evidence of mitral stenosis. 4. The aortic valve is grossly normal. There is mild thickening of the aortic valve. Aortic valve regurgitation is trivial. No aortic stenosis is present. 5. The inferior vena cava is dilated in size with >50% respiratory variability, suggesting right atrial pressure of 8 mmHg.  FINDINGS Left Ventricle: Left ventricular ejection fraction, by estimation, is 60 to 65%. The left ventricle has normal function. The left ventricle has no regional wall motion abnormalities. 3D left ventricular ejection fraction analysis performed but not reported based on interpreter judgement due to suboptimal quality. The left ventricular internal cavity size was normal in size. There is mild left ventricular hypertrophy. Left ventricular diastolic parameters are indeterminate.  Right Ventricle: The right ventricular size is normal. No increase in right ventricular wall thickness. Right ventricular systolic function is normal. Tricuspid regurgitation signal is inadequate for assessing PA pressure.  Left Atrium: Left atrial size was normal in size.  Right Atrium: Right atrial size was normal in size.  Pericardium: There is no evidence of pericardial effusion.  Mitral Valve: The mitral valve  is degenerative in appearance. Mild mitral annular calcification. Trivial mitral valve regurgitation. No evidence of mitral valve stenosis.  Tricuspid Valve: The tricuspid valve is normal in structure. Tricuspid valve regurgitation is mild . No evidence of tricuspid stenosis.  Aortic Valve: The aortic valve is  grossly normal. There is mild thickening of the aortic valve. Aortic valve regurgitation is trivial. No aortic stenosis is present. Aortic valve mean gradient measures 4.0 mmHg. Aortic valve peak gradient measures 7.3 mmHg. Aortic valve area, by VTI measures 3.09 cm.  Pulmonic Valve: The pulmonic valve was normal in structure. Pulmonic valve regurgitation is not visualized. No evidence of pulmonic stenosis.  Aorta: The aortic root is normal in size and structure.  Venous: The inferior vena cava is dilated in size with greater than 50% respiratory variability, suggesting right atrial pressure of 8 mmHg.  IAS/Shunts: The interatrial septum was not well visualized.   LEFT VENTRICLE PLAX 2D LVIDd:         3.90 cm LVIDs:         2.70 cm LV PW:         0.90 cm LV IVS:        1.10 cm LVOT diam:     2.00 cm  3D Volume EF: LV SV:         74       3D EF:        69 % LV SV Index:   37 LVOT Area:     3.14 cm   RIGHT VENTRICLE RV Basal diam:  3.20 cm  LEFT ATRIUM             Index       RIGHT ATRIUM           Index LA diam:        3.30 cm 1.68 cm/m  RA Area:     17.20 cm LA Vol (A2C):   53.2 ml 27.03 ml/m RA Volume:   41.90 ml  21.29 ml/m LA Vol (A4C):   48.3 ml 24.54 ml/m LA Biplane Vol: 52.0 ml 26.43 ml/m AORTIC VALVE AV Area (Vmax):    3.00 cm AV Area (Vmean):   3.03 cm AV Area (VTI):     3.09 cm AV Vmax:           135.00 cm/s AV Vmean:          92.900 cm/s AV VTI:            0.238 m AV Peak Grad:      7.3 mmHg AV Mean Grad:      4.0 mmHg LVOT Vmax:         129.00 cm/s LVOT Vmean:        89.700 cm/s LVOT VTI:          0.234 m LVOT/AV VTI ratio: 0.98  AORTA Ao Root diam: 3.30 cm Ao Asc diam:  3.63 cm   SHUNTS Systemic VTI:  0.23 m Systemic Diam: 2.00 cm  Cherlynn Kaiser MD Electronically signed by Cherlynn Kaiser MD Signature Date/Time: 05/04/2021/1:17:47 PM    Final             Assessment & Plan    1.  Paroxysmal atrial fibrillation: -Today patient  is rate controlled at 70 bpm and reports no recurrence of palpitations or tachycardia. -Continue as needed Cardizem 30 mg and Eliquis 5 mg twice daily. -Patient currently not eligible for dose reduction with Eliquis due to current creatinine and weight  2.  Ascending aortic aneurysm: -Surveillance CT of the chest completed showing stable aortic aneurysm at 4.7 cm -He will repeat CT scan in 1 year and advised to continue blood pressure control  3.  Hyperlipidemia: -Patient's last LDL cholesterol was 131 completed last January -Continue current dose of atorvastatin 10 mg daily -We will obtain copy of lipids and LFTs from PCP  4.  Syncope: -Patient was admitted last May with brief episode of syncope. -Today he reports no recurrence since episode occurred 02/2022  5.  History of SVT: -Patient reports no recurrence or tachycardia since previous visit.  Disposition: Follow-up with Candee Furbish, MD or APP in 12 months   Medication Adjustments/Labs and Tests Ordered: Current medicines are reviewed at length with the patient today.  Concerns regarding medicines are outlined above.   Signed, Mable Fill, Marissa Nestle, NP 11/29/2022, 2:46 PM [facility]  Note:  This document was prepared using Dragon voice recognition software and may include unintentional dictation errors.

## 2022-11-30 ENCOUNTER — Ambulatory Visit: Payer: Medicare Other | Attending: Nurse Practitioner | Admitting: Nurse Practitioner

## 2022-11-30 ENCOUNTER — Encounter: Payer: Self-pay | Admitting: Nurse Practitioner

## 2022-11-30 VITALS — BP 116/70 | HR 70 | Ht 70.0 in | Wt 165.0 lb

## 2022-11-30 DIAGNOSIS — R55 Syncope and collapse: Secondary | ICD-10-CM | POA: Diagnosis not present

## 2022-11-30 DIAGNOSIS — I7121 Aneurysm of the ascending aorta, without rupture: Secondary | ICD-10-CM | POA: Diagnosis not present

## 2022-11-30 DIAGNOSIS — I480 Paroxysmal atrial fibrillation: Secondary | ICD-10-CM | POA: Diagnosis not present

## 2022-11-30 DIAGNOSIS — E785 Hyperlipidemia, unspecified: Secondary | ICD-10-CM

## 2022-11-30 DIAGNOSIS — Z8679 Personal history of other diseases of the circulatory system: Secondary | ICD-10-CM | POA: Diagnosis not present

## 2022-11-30 NOTE — Patient Instructions (Signed)
Medication Instructions:  Your physician recommends that you continue on your current medications as directed. Please refer to the Current Medication list given to you today. *If you need a refill on your cardiac medications before your next appointment, please call your pharmacy*   Lab Work: None ordered We requested labs from your primary care provider If you have labs (blood work) drawn today and your tests are completely normal, you will receive your results only by: [place] (if you have MyChart) OR A paper copy in the mail If you have any lab test that is abnormal or we need to change your treatment, we will call you to review the results.   Testing/Procedures: None ordered   Follow-Up: At [facility], you and your health needs are our priority.  As part of our continuing mission to provide you with exceptional heart care, we have created designated Provider Care Teams.  These Care Teams include your primary Cardiologist (physician) and Advanced Practice Providers (APPs -  Physician Assistants and Nurse Practitioners) who all work together to provide you with the care you need, when you need it.  We recommend signing up for the patient portal called "MyChart".  Sign up information is provided on this After Visit Summary.  MyChart is used to connect with patients for Virtual Visits (Telemedicine).  Patients are able to view lab/test results, encounter notes, upcoming appointments, etc.  Non-urgent messages can be sent to your provider as well.   To learn more about what you can do with MyChart, go to [email or web address].    Your next appointment:   12 month(s)  Provider:   Candee Furbish, MD     Other Instructions

## 2022-12-01 ENCOUNTER — Telehealth: Payer: Self-pay | Admitting: *Deleted

## 2022-12-01 DIAGNOSIS — I7121 Aneurysm of the ascending aorta, without rupture: Secondary | ICD-10-CM

## 2022-12-01 NOTE — Telephone Encounter (Signed)
Stable ascending thoracic aortic aneurysm-4.7 cm.  We will repeat scan in 1 year.  Candee Furbish, MD   Order placed for 1 year.

## 2023-01-04 DIAGNOSIS — Z85828 Personal history of other malignant neoplasm of skin: Secondary | ICD-10-CM | POA: Diagnosis not present

## 2023-01-04 DIAGNOSIS — D225 Melanocytic nevi of trunk: Secondary | ICD-10-CM | POA: Diagnosis not present

## 2023-01-04 DIAGNOSIS — L820 Inflamed seborrheic keratosis: Secondary | ICD-10-CM | POA: Diagnosis not present

## 2023-01-04 DIAGNOSIS — L309 Dermatitis, unspecified: Secondary | ICD-10-CM | POA: Diagnosis not present

## 2023-01-04 DIAGNOSIS — L570 Actinic keratosis: Secondary | ICD-10-CM | POA: Diagnosis not present

## 2023-02-16 DIAGNOSIS — H35373 Puckering of macula, bilateral: Secondary | ICD-10-CM | POA: Diagnosis not present

## 2023-02-16 DIAGNOSIS — H353132 Nonexudative age-related macular degeneration, bilateral, intermediate dry stage: Secondary | ICD-10-CM | POA: Diagnosis not present

## 2023-02-16 DIAGNOSIS — Z961 Presence of intraocular lens: Secondary | ICD-10-CM | POA: Diagnosis not present

## 2023-02-16 DIAGNOSIS — H18513 Endothelial corneal dystrophy, bilateral: Secondary | ICD-10-CM | POA: Diagnosis not present

## 2023-03-29 DIAGNOSIS — Z7901 Long term (current) use of anticoagulants: Secondary | ICD-10-CM | POA: Diagnosis not present

## 2023-03-29 DIAGNOSIS — Z86010 Personal history of colonic polyps: Secondary | ICD-10-CM | POA: Diagnosis not present

## 2023-03-29 DIAGNOSIS — Z0000 Encounter for general adult medical examination without abnormal findings: Secondary | ICD-10-CM | POA: Diagnosis not present

## 2023-03-29 DIAGNOSIS — I4891 Unspecified atrial fibrillation: Secondary | ICD-10-CM | POA: Diagnosis not present

## 2023-03-29 DIAGNOSIS — E785 Hyperlipidemia, unspecified: Secondary | ICD-10-CM | POA: Diagnosis not present

## 2023-03-29 DIAGNOSIS — I7121 Aneurysm of the ascending aorta, without rupture: Secondary | ICD-10-CM | POA: Diagnosis not present

## 2023-03-29 DIAGNOSIS — M1990 Unspecified osteoarthritis, unspecified site: Secondary | ICD-10-CM | POA: Diagnosis not present

## 2023-03-29 DIAGNOSIS — E46 Unspecified protein-calorie malnutrition: Secondary | ICD-10-CM | POA: Diagnosis not present

## 2023-03-29 LAB — LAB REPORT - SCANNED: EGFR: 79

## 2023-07-22 DIAGNOSIS — Z23 Encounter for immunization: Secondary | ICD-10-CM | POA: Diagnosis not present

## 2023-08-03 DIAGNOSIS — Z23 Encounter for immunization: Secondary | ICD-10-CM | POA: Diagnosis not present

## 2023-10-22 DIAGNOSIS — R490 Dysphonia: Secondary | ICD-10-CM | POA: Diagnosis not present

## 2023-10-22 DIAGNOSIS — R059 Cough, unspecified: Secondary | ICD-10-CM | POA: Diagnosis not present

## 2023-10-22 DIAGNOSIS — R6889 Other general symptoms and signs: Secondary | ICD-10-CM | POA: Diagnosis not present

## 2023-11-19 ENCOUNTER — Ambulatory Visit (HOSPITAL_BASED_OUTPATIENT_CLINIC_OR_DEPARTMENT_OTHER)
Admission: RE | Admit: 2023-11-19 | Discharge: 2023-11-19 | Disposition: A | Discharge status: Home / Self Care | Payer: Medicare Other | Source: Ambulatory Visit | Attending: Cardiology | Admitting: Cardiology

## 2023-11-19 DIAGNOSIS — I7121 Aneurysm of the ascending aorta, without rupture: Secondary | ICD-10-CM

## 2023-11-19 DIAGNOSIS — I2510 Atherosclerotic heart disease of native coronary artery without angina pectoris: Secondary | ICD-10-CM | POA: Diagnosis not present

## 2023-11-19 DIAGNOSIS — R188 Other ascites: Secondary | ICD-10-CM | POA: Diagnosis not present

## 2023-11-19 DIAGNOSIS — R140 Abdominal distension (gaseous): Secondary | ICD-10-CM | POA: Diagnosis not present

## 2023-11-19 MED ORDER — IOHEXOL 350 MG/ML SOLN
100.0000 mL | Freq: Once | INTRAVENOUS | Status: AC | PRN
  Administered 2023-11-19: 100 mL via INTRAVENOUS

## 2023-11-24 DIAGNOSIS — J309 Allergic rhinitis, unspecified: Secondary | ICD-10-CM | POA: Diagnosis not present

## 2023-11-24 DIAGNOSIS — J383 Other diseases of vocal cords: Secondary | ICD-10-CM | POA: Diagnosis not present

## 2023-11-24 DIAGNOSIS — R490 Dysphonia: Secondary | ICD-10-CM | POA: Diagnosis not present

## 2023-11-24 DIAGNOSIS — K117 Disturbances of salivary secretion: Secondary | ICD-10-CM | POA: Diagnosis not present

## 2023-11-29 ENCOUNTER — Telehealth: Payer: Self-pay

## 2023-11-29 NOTE — Telephone Encounter (Signed)
 Patient identification verified by 2 forms. Shade Flood, RN     Erskine Squibb from [facility] Radiology called with urgent report from CT ANGIO CHEST AORTA W/CM & OR WO/CM on 11/19/2023.   Dr. Donato Schultz should be aware of IMPRESSIONS # 1-4 and the associated recommendations.   IMPRESSION: 1. Stable aneurysmal disease of the ascending thoracic aorta measuring approximately 4.5-4.6 cm in greatest measured diameter. Ascending thoracic aortic aneurysm. Recommend semi-annual imaging followup by CTA or MRA and referral to cardiothoracic surgery if not already obtained. This recommendation follows 2010 ACCF/AHA/AATS/ACR/ASA/SCA/SCAI/SIR/STS/SVM Guidelines for the Diagnosis and Management of Patients With Thoracic Aortic Disease. Circulation. 2010; 121: U981-X914. Aortic aneurysm NOS (ICD10-I71.9) 2. Stable calcified coronary artery plaque. 3. The visualized proximal stomach is distended with fluid and shows additional high density material posteriorly in the fundus with smaller focus of additional higher focal density representing either hypervascularity, contrast extravasation or high density ingested material. Correlation suggested with any gastric symptoms or clinical evidence to suggest gastrointestinal bleeding or peptic ulcer disease. 4. Slight increased loss of height of the T7 vertebral body anteriorly resulting in approximately 60-70% maximal loss of vertebral height anteriorly. Stable mild loss of height of the adjacent T6 vertebral body.

## 2023-11-30 ENCOUNTER — Encounter: Payer: Self-pay | Admitting: Cardiology

## 2023-12-05 NOTE — Progress Notes (Unsigned)
  Cardiology Office Note    Patient Name: Gary Rice [facility] Date of Encounter: 12/05/2023  Primary Care Provider:  Joya Martyr, MD Primary Cardiologist:  None Primary Electrophysiologist: None   Past Medical History    Past Medical History:  Diagnosis Date   Astigmatism    Basal cell carcinoma    Inguinal hernia    bilateral   Macular degeneration, age related, nonexudative    bilateral   SVT (supraventricular tachycardia)     History of Present Illness  Gary Rice is a 89 y.o. male with PMH of atrial fibrillation (on Eliquis), SVT, trivial MR, ascending aortic aneurysm who presents today for 1 year follow-up.  Gary Rice was last seen on 11/30/22 for follow-up and reported doing well with no new cardiac complaints.  His blood pressure was stable at that time.  He underwent surveillance CT scan for ascending aortic aneurysm on 11/19/2023 that showed stable aneurysm at 4.5-4.6 cm that was reassuring.   During today's visit the patient reports*** .  Patient denies chest pain, palpitations, dyspnea, PND, orthopnea, nausea, vomiting, dizziness, syncope, edema, weight gain, or early satiety.  ***Notes: -Last ischemic evaluation: -Last echo: -Interim ED visits: Review of Systems  Please see the history of present illness.    All other systems reviewed and are otherwise negative except as noted above.  Physical Exam    Wt Readings from Last 3 Encounters:  11/30/22 165 lb (74.8 kg)  05/05/22 157 lb (71.2 kg)  02/26/22 157 lb 3 oz (71.3 kg)   GN:[id number] were no vitals filed for this visit.,There is no height or weight on file to calculate BMI. GEN: Well nourished, well developed in no acute distress Neck: No JVD; No carotid bruits Pulmonary: Clear to auscultation without rales, wheezing or rhonchi  Cardiovascular: Normal rate. Regular rhythm. Normal S1. Normal S2.   Murmurs: There is no murmur.  ABDOMEN: Soft, non-tender, non-distended EXTREMITIES:  No edema; No  deformity   EKG/LABS/ Recent Cardiac Studies   ECG personally reviewed by me today - ***  Risk Assessment/Calculations:   {Does this patient have ATRIAL FIBRILLATION?:[phone]}      Lab Results  Component Value Date   WBC 10.2 02/24/2022   HGB 12.9 (L) 02/24/2022   HCT 37.7 (L) 02/24/2022   MCV 91.7 02/24/2022   PLT 248 02/24/2022   Lab Results  Component Value Date   CREATININE 0.90 11/19/2022   BUN 23 04/23/2022   NA 143 04/23/2022   K 4.4 04/23/2022   CL 103 04/23/2022   CO2 25 04/23/2022   Lab Results  Component Value Date   TRIG 86 05/12/2021    Lab Results  Component Value Date   HGBA1C 5.8 (H) 05/11/2021   Assessment & Plan    1.Paroxysmal atrial fibrillation: -Today patient is  2.Ascending aortic aneurysm: -Surveillance CT of the chest completed showing stable  3.Hyperlipidemia: -Patient's last LDL cholesterol was  4.History of SVT: -Patient reports       Disposition: Follow-up with None or APP in *** months {Are you ordering a CV Procedure (e.g. stress test, cath, DCCV, TEE, etc)?   Press F2        :[id number]}   Signed, Napoleon Form, Leodis Rains, NP 12/05/2023, 2:43 PM [facility]

## 2023-12-06 ENCOUNTER — Ambulatory Visit: Payer: Medicare Other | Attending: Nurse Practitioner | Admitting: Nurse Practitioner

## 2023-12-06 ENCOUNTER — Encounter: Payer: Self-pay | Admitting: Nurse Practitioner

## 2023-12-06 VITALS — BP 132/70 | HR 82 | Ht 67.0 in | Wt 155.6 lb

## 2023-12-06 DIAGNOSIS — E785 Hyperlipidemia, unspecified: Secondary | ICD-10-CM | POA: Insufficient documentation

## 2023-12-06 DIAGNOSIS — I480 Paroxysmal atrial fibrillation: Secondary | ICD-10-CM | POA: Insufficient documentation

## 2023-12-06 DIAGNOSIS — I7121 Aneurysm of the ascending aorta, without rupture: Secondary | ICD-10-CM | POA: Insufficient documentation

## 2023-12-06 DIAGNOSIS — Z8679 Personal history of other diseases of the circulatory system: Secondary | ICD-10-CM | POA: Insufficient documentation

## 2023-12-06 NOTE — Patient Instructions (Addendum)
 Medication Instructions:  Your physician recommends that you continue on your current medications as directed. Please refer to the Current Medication list given to you today. *If you need a refill on your cardiac medications before your next appointment, please call your pharmacy*   Lab Work: NONE ORDERED If you have labs (blood work) drawn today and your tests are completely normal, you will receive your results only by: MyChart Message (if you have MyChart) OR A paper copy in the mail If you have any lab test that is abnormal or we need to change your treatment, we will call you to review the results.   Testing/Procedures: NONE ORDERED   Follow-Up: At [facility], you and your health needs are our priority.  As part of our continuing mission to provide you with exceptional heart care, we have created designated Provider Care Teams.  These Care Teams include your primary Cardiologist (physician) and Advanced Practice Providers (APPs -  Physician Assistants and Nurse Practitioners) who all work together to provide you with the care you need, when you need it.  We recommend signing up for the patient portal called "MyChart".  Sign up information is provided on this After Visit Summary.  MyChart is used to connect with patients for Virtual Visits (Telemedicine).  Patients are able to view lab/test results, encounter notes, upcoming appointments, etc.  Non-urgent messages can be sent to your provider as well.   To learn more about what you can do with MyChart, go to [email or web address].    Your next appointment:   12 month(s)  Provider:   Donato Schultz, MD  Other Instructions

## 2024-01-06 DIAGNOSIS — D225 Melanocytic nevi of trunk: Secondary | ICD-10-CM | POA: Diagnosis not present

## 2024-01-06 DIAGNOSIS — D2239 Melanocytic nevi of other parts of face: Secondary | ICD-10-CM | POA: Diagnosis not present

## 2024-01-06 DIAGNOSIS — Z85828 Personal history of other malignant neoplasm of skin: Secondary | ICD-10-CM | POA: Diagnosis not present

## 2024-01-06 DIAGNOSIS — L820 Inflamed seborrheic keratosis: Secondary | ICD-10-CM | POA: Diagnosis not present

## 2024-01-06 DIAGNOSIS — L738 Other specified follicular disorders: Secondary | ICD-10-CM | POA: Diagnosis not present

## 2024-01-06 DIAGNOSIS — L570 Actinic keratosis: Secondary | ICD-10-CM | POA: Diagnosis not present

## 2024-01-06 DIAGNOSIS — D485 Neoplasm of uncertain behavior of skin: Secondary | ICD-10-CM | POA: Diagnosis not present

## 2024-01-06 DIAGNOSIS — L821 Other seborrheic keratosis: Secondary | ICD-10-CM | POA: Diagnosis not present

## 2024-01-10 DIAGNOSIS — J309 Allergic rhinitis, unspecified: Secondary | ICD-10-CM | POA: Diagnosis not present

## 2024-01-10 DIAGNOSIS — R490 Dysphonia: Secondary | ICD-10-CM | POA: Diagnosis not present

## 2024-01-10 DIAGNOSIS — J383 Other diseases of vocal cords: Secondary | ICD-10-CM | POA: Diagnosis not present

## 2024-01-11 ENCOUNTER — Encounter: Payer: Self-pay | Admitting: Internal Medicine

## 2024-01-11 ENCOUNTER — Ambulatory Visit: Payer: Medicare Other | Admitting: Internal Medicine

## 2024-01-11 VITALS — BP 130/80 | HR 64 | Ht 69.0 in | Wt 144.0 lb

## 2024-01-11 DIAGNOSIS — Z87891 Personal history of nicotine dependence: Secondary | ICD-10-CM

## 2024-01-11 DIAGNOSIS — R490 Dysphonia: Secondary | ICD-10-CM

## 2024-01-11 DIAGNOSIS — J300 Vasomotor rhinitis: Secondary | ICD-10-CM | POA: Diagnosis not present

## 2024-01-11 MED ORDER — IPRATROPIUM BROMIDE 0.03 % NA SOLN
2.0000 | Freq: Two times a day (BID) | NASAL | 12 refills | Status: AC
Start: 2024-01-11 — End: ?

## 2024-01-11 NOTE — Progress Notes (Signed)
 [facility]    [id number]    Oct 28, 1930  Primary Care Physician:Schwartzman, Loretha Brasil, MD  Referring Physician: Farris Has, MD [address] [place],  [place] [address] Reason for Consultation: chronic cough Date of Consultation: 01/11/2024  Chief complaint:   Chief Complaint  Patient presents with   Consult    Patient states he has a build up of phlegm in his throat that's causes him cough.     HPI: Discussed the use of AI scribe software for clinical note transcription with the patient, who gave verbal consent to proceed.  History of Present Illness He is a 89 year old male who presents with chronic hoarseness and sinus issues following intubation during abdominal surgery two years ago. He was referred by [facility] for evaluation of chronic cough and hoarseness.  Two years ago, he underwent emergency abdominal surgery for a bowel blockage, during which he was intubated and remained on a ventilator for approximately two and a half weeks. Since extubation, he has experienced persistent hoarseness.  He developed a sinus problem characterized by the production of a large, egg yolk-like sputum, which he refers to as an 'oyster', occurring once every day or every two days. Previous analysis of this sputum was reportedly normal.  There is a decrease in the frequency of mucus buildup and coughing, although his wife has difficulty hearing him due to his hoarse voice. No cough waking him at night, shortness of breath, sinus drainage, or runny nose, although he has a history of lifelong mild sinus drainage.  He uses nasal drops periodically for sinus issues and has seen an ear, nose, and throat specialist who performed a laryngoscopy, revealing no significant abnormalities in vocal cord movement.  No history of asthma or bronchitis, although he had asthma as a child, which resolved after he began smoking. He quit smoking in 1968 after approximately ten years of  use, smoking about a pack a day.  No reflux, heartburn, or worsening of cough with eating or talking. He has not tried any over-the-counter treatments for his symptoms.    Social history:  Occupation: Exposures: Smoking history:  Social History   Occupational History   Not on file  Tobacco Use   Smoking status: Former    Current packs/day: 0.00    Average packs/day: 1 pack/day for 10.0 years (10.0 ttl pk-yrs)    Types: Cigarettes    Start date: 79    Quit date: 1968    Years since quitting: 57.3   Smokeless tobacco: Never  Vaping Use   Vaping status: Never Used  Substance and Sexual Activity   Alcohol use: No   Drug use: No   Sexual activity: Not on file    Relevant family history:  Family History  Problem Relation Age of Onset   Heart disease Father    Heart attack Father    Heart attack Son    Sleep apnea Son     Past Medical History:  Diagnosis Date   Astigmatism    Basal cell carcinoma    Inguinal hernia    bilateral   Macular degeneration, age related, nonexudative    bilateral   SVT (supraventricular tachycardia) (HCC)     Past Surgical History:  Procedure Laterality Date   APPENDECTOMY     age 14   BOWEL RESECTION  05/09/2021   Procedure: SMALL BOWEL RESECTION;  Surgeon: Emelia Loron, MD;  Location: [facility] OR;  Service: General;;   CATARACT  EXTRACTION, BILATERAL     HERNIA REPAIR     RIH   INGUINAL HERNIA REPAIR Right 05/02/2021   Procedure: REPAIR  INCARCERATED INGUINAL HERNIA  WITH MESH;  Surgeon: Anda Bamberg, MD;  Location: [facility] OR;  Service: General;  Laterality: Right;   JOINT REPLACEMENT Right    RTHA   LAPAROSCOPY N/A 05/09/2021   Procedure: LAPAROSCOPY DIAGNOSTIC;  Surgeon: Enid Harry, MD;  [facility] OR;  Service: General;  Laterality: N/A;   LAPAROTOMY  05/09/2021   Procedure: EXPLORATORY LAPAROTOMY;  Surgeon: Enid Harry, MD;  Location: [facility] OR;  Service: General;;   TONSILLECTOMY       Physical Exam: Blood  pressure 130/80, pulse 64, height 5\' 9"  (1.753 m), weight 144 lb (65.3 kg), SpO2 95%. Gen:      No acute distress, hoarse voice ENT:  +cobblestoning no nasal polyps, mucus membranes moist Lungs:   mild kyphosis No increased respiratory effort, symmetric chest wall excursion, clear to auscultation bilaterally, no wheezes or crackles CV:         Regular rate and rhythm; no murmurs, rubs, or gallops.  No pedal edema Abd:      + bowel sounds; soft, non-tender; no distension MSK: no acute synovitis of DIP or PIP joints, no mechanics hands.  Skin:      Warm and dry; no rashes Neuro: normal speech, no focal facial asymmetry Psych: alert and oriented x3, normal mood and affect   Data Reviewed/Medical Decision Making:  Independent interpretation of tests: Imaging:  Review of patient's ct angio from 2024 images revealed no acute pulmonary process mild dependent atelectasis, no fibrosis or honeycombing. The patient's images have been independently reviewed by me.    PFTs:  Labs:   Lab Results  Component Value Date   NA 143 04/23/2022   K 4.4 04/23/2022   CO2 25 04/23/2022   GLUCOSE 103 (H) 04/23/2022   BUN 23 04/23/2022   CREATININE 0.90 11/19/2022   CALCIUM 9.1 04/23/2022   EGFR 79.0 03/29/2023   GFRNONAA >60 02/24/2022     Lab Results  Component Value Date   WBC 10.2 02/24/2022   HGB 12.9 (L) 02/24/2022   HCT 37.7 (L) 02/24/2022   MCV 91.7 02/24/2022   PLT 248 02/24/2022     Immunization status:   There is no immunization history on file for this patient.   I reviewed prior external note(s) from ENT  I reviewed the result(s) of the labs and imaging as noted above.   I have ordered    Assessment & Plan Chronic Hoarseness most likely related to post nasal drainage Chronic hoarseness post-extubation likely due to post-nasal drip irritating vocal cords. ENT evaluation showed no vocal cord paralysis or abnormal motion. CT scan showed no chronic lung disease or scarring. -  Start ipratropium nasal spray twice daily, may increase to four times daily if needed. - Perform warm saltwater gargles. - Refer to laryngologist Dr. Soldatova for further evaluation.     Return to Care: Return if symptoms worsen or fail to improve.  Louie Rover, MD Pulmonary and Critical Care Medicine [facility] [phone]  CC: Ronna Coho, MD

## 2024-01-11 NOTE — Patient Instructions (Addendum)
 It was a pleasure to see you today!  Please schedule follow up with me as needed. Please call [phone] if issues or concerns arise. You can also send us  a message through MyChart, but but aware that this is not to be used for urgent issues and it may take up to 5-7 days to receive a reply. Please be aware that you will likely be able to view your results before I have a chance to respond to them. Please give us  5 business days to respond to any non-urgent results.   VISIT SUMMARY:  You came in today to discuss your chronic hoarseness and sinus issues that have been ongoing since your abdominal surgery two years ago. We reviewed your symptoms and previous evaluations, including a laryngoscopy and CT scan, which showed no significant abnormalities.  YOUR PLAN:  -CHRONIC HOARSENESS: Chronic hoarseness can occur when the vocal cords are irritated, often due to post-nasal drip. We will start you on ipratropium nasal spray twice daily, which can be increased to four times daily if needed. Additionally, perform warm saltwater gargles to soothe your throat. You will also be referred to a laryngologist, Dr. Soldatova, for further evaluation.   ipratropium - 1 spray on each side of your nose twice a day, can increase up to 4 times a day.   Instructions for use: If you also use a saline nasal spray or rinse, use that first. Position the head with the chin slightly tucked. Use the right hand to spray into the left nostril and the right hand to spray into the left nostril.   Point the bottle away from the septum of your nose (cartilage that divides the two sides of your nose).  Hold the nostril closed on the opposite side from where you will spray Spray once and gently sniff to pull the medicine into the higher parts of your nose.  Don't sniff too hard as the medicine will drain down the back of your throat instead. Repeat with a second spray on the same side if prescribed. Repeat on the other side of  your nose.

## 2024-01-13 ENCOUNTER — Encounter (INDEPENDENT_AMBULATORY_CARE_PROVIDER_SITE_OTHER): Payer: Self-pay | Admitting: Otolaryngology

## 2024-01-13 ENCOUNTER — Ambulatory Visit (INDEPENDENT_AMBULATORY_CARE_PROVIDER_SITE_OTHER): Admitting: Otolaryngology

## 2024-01-13 VITALS — BP 150/83 | HR 64 | Ht 69.0 in | Wt 145.0 lb

## 2024-01-13 DIAGNOSIS — R1310 Dysphagia, unspecified: Secondary | ICD-10-CM | POA: Diagnosis not present

## 2024-01-13 DIAGNOSIS — R490 Dysphonia: Secondary | ICD-10-CM | POA: Diagnosis not present

## 2024-01-13 DIAGNOSIS — J383 Other diseases of vocal cords: Secondary | ICD-10-CM | POA: Diagnosis not present

## 2024-01-13 NOTE — Progress Notes (Signed)
 ENT CONSULT:  Reason for Consult: dysphonia    HPI: Discussed the use of AI scribe software for clinical note transcription with the patient, who gave verbal consent to proceed.  History of Present Illness Gary Rice is a 89 year old male who presents with persistent hoarseness and voice changes following prolonged intubation.   He has experienced persistent hoarseness and voice changes since being intubated due to a bowel obstruction three years ago. The intubation was prolonged due to complications, including an infection. Since then, his voice has significantly changed, described as 'haywire,' making communication difficult at times. There has been no significant improvement in his voice over time.  He has been using reflux medications recently, which have provided some improvement over the past two days. He experiences daily episodes of mucus or drainage, which he refers to as 'oysters,' that he needs to dislodge by coughing, providing temporary relief almost every day. He has not yet tried salt water gargles.  He reports significant weight loss from 175 pounds to 144-145 pounds since his hospitalization three years ago, attributing this to muscle loss from inactivity and reduced food intake/exercise. He notes decreased activity levels and has not addressed this issue this year.  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 He smoked for less than ten years and has not had significant allergy issues since, except for one incident during a camping trip with the [employer].  No history of aspiration pneumonia. When eating, he sometimes regurgitates undigested food, which he then chews and swallows again.  Records Reviewed:  [facility] office 01/10/24 The patient is a 89 year old male here today for a follow-up visit. He was seen on 11/24/2023 for follow-up on congestion  and voice issues. 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 Dr. Liliane Bade, MD had reviewed his last scope. It was suggested that he consider getting SLP voice training, checking on some LPR triggers, and possibly pursuing speech therapy.  He has not yet initiated speech therapy due to communication issues with the provider, as he tends to disregard calls from unknown numbers. He reports no significant changes in his condition since the last visit. He continues to consume coffee and beer, with a particular fondness for 2 beers on Saturday nights. He has not made any dietary modifications since the previous consultation.  He has a lifelong history of rhinorrhea, which he attributes to childhood allergies and asthma. He occasionally uses Allegra for his runny nose, which typically manifests upon waking and is not associated with food intake. He also experiences clear nasal discharge, which he has had for over 60 years. He recalls a sinus vacuum procedure performed approximately 60 years ago, which did not provide relief. He has not sought consultation with an allergist for his nasal drip. His rhinorrhea is intermittent, with periods of several days without symptoms.   Pulm office visit 01/11/24 He is a 89 year old male who presents with chronic hoarseness and sinus issues following intubation during abdominal surgery two years ago. He was referred by [facility] for evaluation of chronic cough and hoarseness.   Two years ago, he underwent emergency abdominal surgery for a bowel blockage, during which he was intubated and remained on a ventilator for approximately two and a half weeks. Since extubation, he has experienced persistent hoarseness.   He developed a sinus problem characterized  by the production of a large, egg yolk-like sputum, which he refers to as an 'oyster', occurring once every day or every two days.  Previous analysis of this sputum was reportedly normal.   There is a decrease in the frequency of mucus buildup and coughing, although his wife has difficulty hearing him due to his hoarse voice. No cough waking him at night, shortness of breath, sinus drainage, or runny nose, although he has a history of lifelong mild sinus drainage.   He uses nasal drops periodically for sinus issues and has seen an ear, nose, and throat specialist who performed a laryngoscopy, revealing no significant abnormalities in vocal cord movement.   No history of asthma or bronchitis, although he had asthma as a child, which resolved after he began smoking. He quit smoking in 1968 after approximately ten years of use, smoking about a pack a day.   No reflux, heartburn, or worsening of cough with eating or talking. He has not tried any over-the-counter treatments for his symptoms.  Chronic Hoarseness most likely related to post nasal drainage Chronic hoarseness post-extubation likely due to post-nasal drip irritating vocal cords. ENT evaluation showed no vocal cord paralysis or abnormal motion. CT scan showed no chronic lung disease or scarring. - Start ipratropium nasal spray twice daily, may increase to four times daily if needed. - Perform warm salt water gargles. - Refer to laryngologist Dr. Dalen Hennessee for further evaluation.   Past Medical History:  Diagnosis Date   Astigmatism    Basal cell carcinoma    Inguinal hernia    bilateral   Macular degeneration, age related, nonexudative    bilateral   SVT (supraventricular tachycardia) (HCC)     Past Surgical History:  Procedure Laterality Date   APPENDECTOMY     age 21   BOWEL RESECTION  05/09/2021   Procedure: SMALL BOWEL RESECTION;  Surgeon: Enid Harry, MD;  Location: [facility] OR;  Service: General;;   CATARACT EXTRACTION, BILATERAL     HERNIA REPAIR     RIH   INGUINAL HERNIA REPAIR Right 05/02/2021   Procedure: REPAIR  INCARCERATED INGUINAL HERNIA   WITH MESH;  Surgeon: Anda Bamberg, MD;  Location: [facility] OR;  Service: General;  Laterality: Right;   JOINT REPLACEMENT Right    RTHA   LAPAROSCOPY N/A 05/09/2021   Procedure: LAPAROSCOPY DIAGNOSTIC;  Surgeon: Enid Harry, MD;  Location: [facility] OR;  Service: General;  Laterality: N/A;   LAPAROTOMY  05/09/2021   Procedure: EXPLORATORY LAPAROTOMY;  Surgeon: Enid Harry, MD;  Location: [facility] OR;  Service: General;;   TONSILLECTOMY      Family History  Problem Relation Age of Onset   Heart disease Father    Heart attack Father    Heart attack Son    Sleep apnea Son     Social History:  reports that he quit smoking about 57 years ago. His smoking use included cigarettes. He started smoking about 67 years ago. He has a 10 pack-year smoking history. He has never used smokeless tobacco. He reports that he does not drink alcohol and does not use drugs.  Allergies: No Known Allergies  Medications: I have reviewed the patient's current medications.  The PMH, PSH, Medications, Allergies, and SH were reviewed and updated.  ROS: Constitutional: Negative for fever, weight loss and weight gain. Cardiovascular: Negative for chest pain and dyspnea on exertion. Respiratory: Is not experiencing shortness of breath at rest. Gastrointestinal: Negative for nausea and vomiting. Neurological: Negative for headaches. Psychiatric: The patient  is not nervous/anxious  Blood pressure (!) 150/83, pulse 64, height 5\' 9"  (1.753 m), weight 145 lb (65.8 kg), SpO2 95%. Body mass index is 21.41 kg/m.  PHYSICAL EXAM:  Exam: General: Well-developed, well-nourished Communication and Voice: raspy Respiratory Respiratory effort: Equal inspiration and expiration without stridor Cardiovascular Peripheral Vascular: Warm extremities with equal color/perfusion Eyes: No nystagmus with equal extraocular motion bilaterally Neuro/Psych/Balance: Patient oriented to person, place, and time; Appropriate mood and  affect; Gait is intact with no imbalance; Cranial nerves I-XII are intact Head and Face Inspection: Normocephalic and atraumatic without mass or lesion Palpation: Facial skeleton intact without bony stepoffs Salivary Glands: No mass or tenderness Facial Strength: Facial motility symmetric and full bilaterally ENT Pinna: External ear intact and fully developed External canal: Canal is patent with intact skin Tympanic Membrane: Clear and mobile External Nose: No scar or anatomic deformity Internal Nose: Septum is deviated to the left. No polyp, or purulence. Mucosal edema and erythema present.  Bilateral inferior turbinate hypertrophy.  Lips, Teeth, and gums: Mucosa and teeth intact and viable TMJ: No pain to palpation with full mobility Oral cavity/oropharynx: No erythema or exudate, no lesions present Nasopharynx: No mass or lesion with intact mucosa Hypopharynx: Intact mucosa without pooling of secretions Larynx Glottic: Full true vocal cord mobility without lesion or mass Supraglottic: Normal appearing epiglottis and AE folds Interarytenoid Space: Moderate pachydermia&edema Subglottic Space: Patent without lesion or edema Neck Neck and Trachea: Midline trachea without mass or lesion Thyroid: No mass or nodularity Lymphatics: No lymphadenopathy  Procedure:  Preoperative diagnosis: hoarseness  Postoperative diagnosis/findings:  same + VF atrophy glottic insufficiency + pooling of secretions/residual Ensure in left > right pyriforms and scant residual Ensure along the supraglottic structures  Procedure: Flexible fiberoptic laryngoscopy with stroboscopy (96045)   Surgeon: Ashok Croon, MD  Anesthesia: Topical lidocaine and Afrin  Complications: None  Condition is stable throughout exam  Indications and consent:   The patient presents to the clinic with hoarseness. All the risks, benefits, and potential complications were reviewed with the patient preoperatively and  informed verbal consent was obtained.  Procedure: The patient was seated upright in the exam chair.   Topical lidocaine and Afrin were applied to the nasal cavity. After adequate anesthesia had occurred, the flexible telescope with strobe capabilities was passed into the nasal cavity. The nasopharynx was patent without mass or lesion. The scope was passed behind the soft palate and directed toward the base of tongue. The base of tongue was visualized and was symmetric with no apparent masses or abnormal appearing tissue. There were no signs of a mass or pooling of secretions in the piriform sinuses. The supraglottic structures were normal.  The true vocal cords are mobile. The medial edges were bowed. Closure was incomplete. Periodicity present. The mucosal wave and amplitude were normal and symmetric. There is moderate interarytenoid pachydermia and post cricoid edema. The mucosa appears without lesions.   The laryngoscope was then slowly withdrawn and the patient tolerated the procedure well. There were no complications or blood loss.    Studies Reviewed: 11/19/23 CT Angio chest 1. Stable aneurysmal disease of the ascending thoracic aorta measuring approximately 4.5-4.6 cm in greatest measured diameter. Ascending thoracic aortic aneurysm. Recommend semi-annual imaging followup by CTA or MRA and referral to cardiothoracic surgery if not already obtained.  2. Stable calcified coronary artery plaque. 3. The visualized proximal stomach is distended with fluid and shows additional high density material posteriorly in the fundus with smaller focus of additional higher focal  density representing either hypervascularity, contrast extravasation or high density ingested material. Correlation suggested with any gastric symptoms or clinical evidence to suggest gastrointestinal bleeding or peptic ulcer disease. 4. Slight increased loss of height of the T7 vertebral body anteriorly resulting in  approximately 60-70% maximal loss of vertebral height anteriorly. Stable mild loss of height of the adjacent T6 vertebral body.   Assessment/Plan: Encounter Diagnoses  Name Primary?   Dysphagia, unspecified type    Age-related vocal fold atrophy    Glottic insufficiency    Dysphonia Yes    Assessment and Plan Assessment & Plan Chronic dysphonia, Vocal cord atrophy and glottic insufficiency Chronic hoarseness and voice changes for 2-3 years following prolonged intubation for several weeks due to SBO and complications, strobe today with evidence of  vocal cord atrophy/glottic insufficiency with incomplete closure and pooling of secretions in left>right pyriforms. Discussed interventions including vocal cord filler injection augmentation. We also discussed a need to evaluate swallowing  - Order swallow study to assess aspiration risk and esophageal conditions 2/2 hx of food regurgitation (MBS esophagram) - Refer to voice/swallow therapy to improve swallowing and vocal cord function. - Consider vocal cord filler injection augmentation if therapy is insufficient.  Dysphagia Difficulty swallowing with sensation of mucus in throat. Pooling of secretions on vocal cords and pyriforms, suggests oropharyngeal vs esophageal dysphagia. Swallow study to determine aspiration discussed - Order swallow study to evaluate swallowing. - Refer to swallow therapy to improve swallowing techniques and safety.  Weight loss Gradual weight loss from 175 lbs to 144 lbs over three years, likely due to decreased oral intake and reduced activity. May contribute to vocal cord atrophy. - Monitor weight and nutritional intake. - Encourage increased oral intake and activity as tolerated.   Thank you for allowing me to participate in the care of this patient. Please do not hesitate to contact me with any questions or concerns.   Artice Last, MD Otolaryngology [facility] Phone:  [phone] Fax: [phone]    01/13/2024, 7:48 PM

## 2024-01-13 NOTE — Patient Instructions (Signed)

## 2024-01-14 ENCOUNTER — Other Ambulatory Visit (HOSPITAL_COMMUNITY): Payer: Self-pay | Admitting: Otolaryngology

## 2024-01-14 DIAGNOSIS — R059 Cough, unspecified: Secondary | ICD-10-CM

## 2024-01-14 DIAGNOSIS — R1310 Dysphagia, unspecified: Secondary | ICD-10-CM

## 2024-01-31 ENCOUNTER — Ambulatory Visit (HOSPITAL_COMMUNITY)
Admission: RE | Admit: 2024-01-31 | Discharge: 2024-01-31 | Disposition: A | Discharge status: Home / Self Care | Source: Ambulatory Visit | Attending: Internal Medicine | Admitting: Internal Medicine

## 2024-01-31 ENCOUNTER — Ambulatory Visit (HOSPITAL_COMMUNITY)
Admission: RE | Admit: 2024-01-31 | Discharge: 2024-01-31 | Disposition: A | Discharge status: Home / Self Care | Source: Ambulatory Visit | Attending: Otolaryngology | Admitting: Otolaryngology

## 2024-01-31 DIAGNOSIS — R1312 Dysphagia, oropharyngeal phase: Secondary | ICD-10-CM | POA: Diagnosis not present

## 2024-01-31 DIAGNOSIS — K224 Dyskinesia of esophagus: Secondary | ICD-10-CM | POA: Insufficient documentation

## 2024-01-31 DIAGNOSIS — R059 Cough, unspecified: Secondary | ICD-10-CM

## 2024-01-31 DIAGNOSIS — R1310 Dysphagia, unspecified: Secondary | ICD-10-CM

## 2024-01-31 DIAGNOSIS — R490 Dysphonia: Secondary | ICD-10-CM | POA: Diagnosis not present

## 2024-01-31 NOTE — Therapy (Signed)
 Modified Barium Swallow Study  Patient Details  Name: Kerrick Avilla MRN: [id number] Date of Birth: 11/05/1930  Today's Date: 01/31/2024  Modified Barium Swallow completed.  Full report located under Chart Review in the Imaging Section.  History of Present Illness Delontae Salib is a 89 y.o. male with PMH: basal cell carcinoma, macular degeneration, SVT, who is here for an OP MBS as referred by ENT secondary to his c/o hoarseness and voice changes since being intubated due to a bowel obstruction three years ago. In addition, he has had difficulty swallowing with sensation of mucous in his throat. ENT evaluation did not show vocal cord paralysis or abnormal motion but pooling of secretions in pyriform sinuses was present.   Clinical Impression Morrell Lao presents with an oropharyngeal dysphagia consisting of decreased anterior hyoid excursion, diminshed pharyngeal stripping wave, incomplete laryngeal vestibule closure and reduced duration of PES segment opening. This resulted in mild-moderate amount of pharyngeal residuals remaining in vallecular sinus, pyriform sinus and posterior pharyngeal wall after initial swallow was completed. Amount of pharyngeal residuals increased as viscosity of liquids and density of solids increased. In addition, suspected prominent cricopharyngeal bar restricted barium transit. Patient able to reduce amount of pharyngeal residuals with two-three additional dry swallows. When given a sip of thin liquid barium while he had mechanical soft solid barium residuals in pharynx, this resulted in flash, shallow penetration (PAS 2) of thin liquids. No aspiration observed with any of the tested barium consistencies during any phase of the swallow. SLP educated patient and he verbalized agreement.   Swallow Evaluation Recommendations Recommendations: PO diet PO Diet Recommendation: Dysphagia 3 (Mechanical soft);Regular;Thin liquids (Level 0) Liquid Administration via:  Cup Medication Administration: Other (Comment) Supervision: Patient able to self-feed Swallowing strategies  : Small bites/sips;Slow rate;Multiple dry swallows after each bite/sip Postural changes: Stay upright 30-60 min after meals;Position pt fully upright for meals Oral care recommendations: Oral care BID (2x/day)      Jacqualine Mater, MA, CCC-SLP Speech Therapy

## 2024-01-31 NOTE — Therapy (Unsigned)
 OUTPATIENT SPEECH LANGUAGE PATHOLOGY VOICE EVALUATION   Patient Name: Gary Rice MRN: [id number] DOB:1931/05/17, 89 y.o., male Today's Date: 02/01/2024  PCP: Ronna Coho MD REFERRING PROVIDER: Artice Last, MD  END OF SESSION:  End of Session - 02/01/24 1456     Visit Number 1    Number of Visits 9    Date for SLP Re-Evaluation 04/11/24   extended scheudling   Authorization Type Medicare    SLP Start Time 1405    SLP Stop Time  1455    SLP Time Calculation (min) 50 min    Activity Tolerance Patient tolerated treatment well             Past Medical History:  Diagnosis Date   Astigmatism    Basal cell carcinoma    Inguinal hernia    bilateral   Macular degeneration, age related, nonexudative    bilateral   SVT (supraventricular tachycardia) (HCC)    Past Surgical History:  Procedure Laterality Date   APPENDECTOMY     age 80   BOWEL RESECTION  05/09/2021   Procedure: SMALL BOWEL RESECTION;  Surgeon: Enid Harry, MD;  Location: [facility] OR;  Service: General;;   CATARACT EXTRACTION, BILATERAL     HERNIA REPAIR     RIH   INGUINAL HERNIA REPAIR Right 05/02/2021   Procedure: REPAIR  INCARCERATED INGUINAL HERNIA  WITH MESH;  Surgeon: Anda Bamberg, MD;  Location: [facility] OR;  Service: General;  Laterality: Right;   JOINT REPLACEMENT Right    RTHA   LAPAROSCOPY N/A 05/09/2021   Procedure: LAPAROSCOPY DIAGNOSTIC;  Surgeon: Enid Harry, MD;  Location: [facility] OR;  Service: General;  Laterality: N/A;   LAPAROTOMY  05/09/2021   Procedure: EXPLORATORY LAPAROTOMY;  Surgeon: Enid Harry, MD;  Location: [facility] OR;  Service: General;;   TONSILLECTOMY     Patient Active Problem List   Diagnosis Date Noted   Syncope 02/23/2022   Paroxysmal A-fib (HCC) 02/23/2022   Head injury 02/23/2022   Ileus (HCC) 06/08/2021   Cholecystitis, acute    Acute blood loss anemia    History of supraventricular tachycardia    Hyponatremia    Debility 05/26/2021   Pelvic abscess  in male [facility]) 05/14/2021   Malnutrition of moderate degree 05/12/2021   Atrial fibrillation with rapid ventricular response (HCC) 05/02/2021   SBO (small bowel obstruction) (HCC) 05/02/2021   Leukocytosis 05/02/2021   Hyperlipidemia 05/02/2021   Thoracic aortic aneurysm (HCC) 05/02/2021   Incarcerated hernia     Onset date: 01/13/24 (referral date)  REFERRING DIAG: R13.10 (ICD-10-CM) - Dysphagia, unspecified type 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    He has experienced persistent hoarseness and voice changes since being intubated due to a bowel obstruction three years ago. The intubation was prolonged due to complications, including an infection. Since then, his voice has significantly changed, described as 'haywire,' making communication difficult at times. There has been no significant improvement in his voice over time.   He has been using reflux medications recently, which have provided some improvement over the past two days. He experiences daily episodes of mucus  or drainage, which he refers to as 'oysters,' that he needs to dislodge by coughing, providing temporary relief almost every day. He has not yet tried salt water  gargles.   He reports significant weight loss from 175 pounds to 144-145 pounds since his hospitalization three years ago, attributing this to muscle loss from inactivity and reduced food intake/exercise. He notes decreased activity levels and has not addressed this issue this year.   His past  medical history includes a history of allergies, which were severe during childhood but improved after he began smoking while in the service in [place] . He smoked for less than ten years and has not had significant allergy issues since, except for one incident during a camping trip with the [employer].   No history of aspiration pneumonia. When eating, he sometimes regurgitates undigested food, which he then chews and swallows again."  PAIN:  Are you having pain? No  FALLS: Has patient fallen in last 6 months? No  LIVING ENVIRONMENT: Lives with: lives with their family Lives in: House/apartment  PLOF:Level of assistance: Independent with ADLs, Independent with IADLs Employment: Retired  PATIENT GOALS: improve voice quality   OBJECTIVE:  Note: Objective measures were completed at Evaluation unless otherwise noted.  DIAGNOSTIC FINDINGS: Procedure: The base of tongue was visualized and was symmetric with no apparent masses or abnormal appearing tissue. There were no signs of a mass or pooling of secretions in the piriform sinuses. The supraglottic structures were normal. The true vocal cords are mobile. The medial edges were bowed. Closure was incomplete. Periodicity present. The mucosal wave and amplitude were normal and symmetric. There is moderate interarytenoid pachydermia and post cricoid edema. The mucosa appears without lesions.   The laryngoscope was then slowly withdrawn and the patient tolerated the procedure well. There were no complications or blood loss.  MBSS (01/31/24): Francoise Ishihara presents with an oropharyngeal dysphagia consisting of decreased anterior hyoid excursion, diminshed pharyngeal stripping wave, incomplete laryngeal vestibule closure and reduced duration of PES segment opening. This resulted in mild-moderate amount of pharyngeal residuals remaining in vallecular sinus, pyriform sinus and posterior pharyngeal wall after initial swallow was completed. Amount of pharyngeal  residuals increased as viscosity of liquids and density of solids increased. In addition, suspected prominent cricopharyngeal bar restricted barium transit. Patient able to reduce amount of pharyngeal residuals with two-three additional dry swallows. When given a sip of thin liquid barium while he had mechanical soft solid barium residuals in pharynx, this resulted in flash, shallow penetration (PAS 2) of thin liquids. No aspiration observed with any of the tested barium consistencies during any phase of the swallow. SLP educated patient and he verbalized agreement.   COGNITION: Overall cognitive status: Within functional limits for tasks assessed  SOCIAL HISTORY: Occupation: Retired  Water  intake: suboptimal Caffeine/alcohol intake: minimal (1 cup)  Daily voice use: minimal  On a scale of 1-5 where 1= very little and 5= excessive, how would you characterize your daily average voice use? 1 On a scale of 0-5, where 0= never and 5= always, how often do you do the following?  Shout or scream 0 Talk loudly 1 Talk a lot 0 Talk over noise 0 Use the phone 0 Sing 0 Surgical history: prolonged intubation  PHYSICAL SYMPTOMS: Do you have any burning, soreness, tickling, or irritation in your throat? No Do you sometimes have a sensation of a lump in your throat?  Yes  "once a day" for phlegm/"oyster"  Do you have any aching or tightness in your  throat? No Do you ever feel tension in your neck area? No Does your voice get tired easily? No Do you feel as if you have to strain to produce voice? No Do you feel as if you need to cough or clear your throat a lot? Yes - reported wife comments on increased coughing Do you ever lose your voice completely? No Do you ever have difficulty swallowing? No Do you have difficulty projecting your voice? No  VOCAL ABUSE:  Episodes observed during evaluation: throat clears x3 Identified triggers: Acid reflux , GERD  PERCEPTUAL VOICE ASSESSMENT: Voice quality:  hoarse, rough, and gravelly Vocal abuse: habitual throat clearing Resonance: normal Respiratory function: thoracic breathing and clavicular breathing  OBJECTIVE VOICE ASSESSMENT: Maximum phonation time for sustained "ah": 27 seconds  Conversational loudness average: 67 dB Conversational loudness range: 63-73 dB S/z ratio: 0.7 (Suggestive of dysfunction >1.0)  Pt does not report difficulty with swallowing, which does not warrant further evaluation  ORAL MOTOR EXAMINATION: Overall status: WFL  PATIENT REPORTED OUTCOME MEASURES (PROM): V-RQOL: 15  TODAY'S TREATMENT:                                                                                                                                         02/01/24: ST evaluation and POC complete. Educated patient on role of SLP related to voice and swallowing. Pt denied dysphagia. Educated patient on recent MBSS results and recommendations, including multiple dry swallows to clear pharyngeal residue. Pt verbalized understanding and carryover of recommendation. Briefly introduced plan to train high intensity voice exercises to address VF atrophy. Pt verbalized understanding and agreeable to trial ST session x1.    PATIENT EDUCATION: Education details: see above Person educated: Patient Education method: Explanation Education comprehension: verbalized understanding and needs further education  HOME EXERCISE PROGRAM: PhoRTE  GOALS: Goals reviewed with patient? Yes  SHORT TERM GOALS: Target date: 02/29/2024  Pt will complete daily HEP x2 sessions Baseline: Goal status: INITIAL  2.  Pt will accurately complete targeted voice exercises given occasional min A  Baseline:  Goal status: INITIAL  3.  Pt will demonstrate improved vocal quality in structured conversation given occasional min A Baseline:  Goal status: INITIAL   LONG TERM GOALS: Target date: 04/11/2024  Pt will complete daily HEP > 1 week  Baseline:  Goal status:  INITIAL  2.  Pt will demonstrate improved vocal quality in unstructured conversations x2 given occasional min A  Baseline:  Goal status: INITIAL  3.  Pt will subjectively report improved voicing via PROM by 2 points by LTG date Baseline: 15 Goal status: INITIAL  4.  Pt will complete swallow exercise program (if warranted) given occasional min A   Baseline:  Goal status: INITIAL  ASSESSMENT:  CLINICAL IMPRESSION: Patient is a 89 y.o. M who was seen today for voice evaluation. Evaluation reveals moderate dysphonia. Pt's voice is c/b hoarse, rough vocal quality.  Pt reports voice change secondary to prolonged intubation related to surgery complications ~3 years ago. Endorsed wife has difficulty understanding him due to change in vocal quality. Denied dysphagia, with use of multiple swallows as needed. MBSS completed with no additional f/u indicated. Pt would benefit from skilled ST to address aforementioned deficits to enhance communication efficacy.    OBJECTIVE IMPAIRMENTS: include voice disorder. These impairments are limiting patient from effectively communicating at home and in community. Factors affecting potential to achieve goals and functional outcome are ability to learn/carryover information and previous level of function. Patient will benefit from skilled SLP services to address above impairments and improve overall function.  REHAB POTENTIAL: Good  PLAN:  SLP FREQUENCY: 1x/week  SLP DURATION: 10 weeks (extended for scheduling)  PLANNED INTERVENTIONS: Cueing hierachy, Internal/external aids, Functional tasks, SLP instruction and feedback, Compensatory strategies, Patient/family education, and [id number] Treatment of speech (30 or 45 min)     Tamar Fairly, CCC-SLP 02/01/2024, 3:11 PM

## 2024-02-01 ENCOUNTER — Ambulatory Visit: Attending: Otolaryngology

## 2024-02-01 DIAGNOSIS — R1310 Dysphagia, unspecified: Secondary | ICD-10-CM | POA: Diagnosis not present

## 2024-02-01 DIAGNOSIS — R1312 Dysphagia, oropharyngeal phase: Secondary | ICD-10-CM

## 2024-02-01 DIAGNOSIS — R490 Dysphonia: Secondary | ICD-10-CM | POA: Diagnosis not present

## 2024-02-01 DIAGNOSIS — J383 Other diseases of vocal cords: Secondary | ICD-10-CM | POA: Diagnosis not present

## 2024-02-08 ENCOUNTER — Ambulatory Visit: Payer: Self-pay

## 2024-02-08 DIAGNOSIS — R1312 Dysphagia, oropharyngeal phase: Secondary | ICD-10-CM | POA: Diagnosis not present

## 2024-02-08 DIAGNOSIS — R1310 Dysphagia, unspecified: Secondary | ICD-10-CM | POA: Diagnosis not present

## 2024-02-08 DIAGNOSIS — R490 Dysphonia: Secondary | ICD-10-CM | POA: Diagnosis not present

## 2024-02-08 DIAGNOSIS — J383 Other diseases of vocal cords: Secondary | ICD-10-CM

## 2024-02-08 NOTE — Therapy (Signed)
 OUTPATIENT SPEECH LANGUAGE PATHOLOGY VOICE TREATMENT   Patient Name: Gary Rice MRN: [id number] DOB:10-20-1930, 89 y.o., male Today's Date: 02/08/2024  PCP: Ronna Coho MD REFERRING PROVIDER: Artice Last, MD  END OF SESSION:  End of Session - 02/08/24 1407     Visit Number 2    Number of Visits 9    Date for SLP Re-Evaluation 04/11/24    Authorization Type Medicare    SLP Start Time 1407    SLP Stop Time  1445    SLP Time Calculation (min) 38 min    Activity Tolerance Patient tolerated treatment well              Past Medical History:  Diagnosis Date   Astigmatism    Basal cell carcinoma    Inguinal hernia    bilateral   Macular degeneration, age related, nonexudative    bilateral   SVT (supraventricular tachycardia) (HCC)    Past Surgical History:  Procedure Laterality Date   APPENDECTOMY     age 70   BOWEL RESECTION  05/09/2021   Procedure: SMALL BOWEL RESECTION;  Surgeon: Enid Harry, MD;  Location: [facility] OR;  Service: General;;   CATARACT EXTRACTION, BILATERAL     HERNIA REPAIR     RIH   INGUINAL HERNIA REPAIR Right 05/02/2021   Procedure: REPAIR  INCARCERATED INGUINAL HERNIA  WITH MESH;  Surgeon: Anda Bamberg, MD;  Location: [facility] OR;  Service: General;  Laterality: Right;   JOINT REPLACEMENT Right    RTHA   LAPAROSCOPY N/A 05/09/2021   Procedure: LAPAROSCOPY DIAGNOSTIC;  Surgeon: Enid Harry, MD;  Location: [facility] OR;  Service: General;  Laterality: N/A;   LAPAROTOMY  05/09/2021   Procedure: EXPLORATORY LAPAROTOMY;  Surgeon: Enid Harry, MD;  Location[facility] OR;  Service: General;;   TONSILLECTOMY     Patient Active Problem List   Diagnosis Date Noted   Syncope 02/23/2022   Paroxysmal A-fib (HCC) 02/23/2022   Head injury 02/23/2022   Ileus (HCC) 06/08/2021   Cholecystitis, acute    Acute blood loss anemia    History of supraventricular tachycardia    Hyponatremia    Debility 05/26/2021   Pelvic abscess in male [facility])  05/14/2021   Malnutrition of moderate degree 05/12/2021   Atrial fibrillation with rapid ventricular response (HCC) 05/02/2021   SBO (small bowel obstruction) (HCC) 05/02/2021   Leukocytosis 05/02/2021   Hyperlipidemia 05/02/2021   Thoracic aortic aneurysm (HCC) 05/02/2021   Incarcerated hernia     Onset date: 01/13/24 (referral date)  REFERRING DIAG: R13.10 (ICD-10-CM) - Dysphagia, unspecified type 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    He has experienced persistent hoarseness and voice changes since being intubated due to a bowel obstruction three years ago. The intubation was prolonged due to complications, including an infection. Since then, his voice has significantly changed, described as 'haywire,' making communication difficult at times. There has been no significant improvement in his voice over time.   He has been using reflux medications recently, which have provided some improvement over the past two days. He experiences daily episodes of mucus or drainage, which he refers to as 'oysters,' that he needs  to dislodge by coughing, providing temporary relief almost every day. He has not yet tried salt water  gargles.   He reports significant weight loss from 175 pounds to 144-145 pounds since his hospitalization three years ago, attributing this to muscle loss from inactivity and reduced food intake/exercise. He notes decreased activity levels and has not addressed this issue this year.   His past medical history includes a history of allergies, which were severe during  childhood but improved after he began smoking while in the service in [place] . He smoked for less than ten years and has not had significant allergy issues since, except for one incident during a camping trip with the [employer].   No history of aspiration pneumonia. When eating, he sometimes regurgitates undigested food, which he then chews and swallows again."  PAIN:  Are you having pain? No  FALLS: Has patient fallen in last 6 months? No  LIVING ENVIRONMENT: Lives with: lives with their family Lives in: House/apartment  PLOF:Level of assistance: Independent with ADLs, Independent with IADLs Employment: Retired  PATIENT GOALS: improve voice quality   OBJECTIVE:  Note: Objective measures were completed at Evaluation unless otherwise noted.  DIAGNOSTIC FINDINGS: Procedure: The base of tongue was visualized and was symmetric with no apparent masses or abnormal appearing tissue. There were no signs of a mass or pooling of secretions in the piriform sinuses. The supraglottic structures were normal. The true vocal cords are mobile. The medial edges were bowed. Closure was incomplete. Periodicity present. The mucosal wave and amplitude were normal and symmetric. There is moderate interarytenoid pachydermia and post cricoid edema. The mucosa appears without lesions.   The laryngoscope was then slowly withdrawn and the patient tolerated the procedure well. There were no complications or blood loss.  MBSS (01/31/24): Gary Rice presents with an oropharyngeal dysphagia consisting of decreased anterior hyoid excursion, diminshed pharyngeal stripping wave, incomplete laryngeal vestibule closure and reduced duration of PES segment opening. This resulted in mild-moderate amount of pharyngeal residuals remaining in vallecular sinus, pyriform sinus and posterior pharyngeal wall after initial swallow was completed. Amount of pharyngeal residuals increased as viscosity of liquids and density of solids increased.  In addition, suspected prominent cricopharyngeal bar restricted barium transit. Patient able to reduce amount of pharyngeal residuals with two-three additional dry swallows. When given a sip of thin liquid barium while he had mechanical soft solid barium residuals in pharynx, this resulted in flash, shallow penetration (PAS 2) of thin liquids. No aspiration observed with any of the tested barium consistencies during any phase of the swallow. SLP educated patient and he verbalized agreement.   COGNITION: Overall cognitive status: Within functional limits for tasks assessed  SOCIAL HISTORY: Occupation: Retired  Water  intake: suboptimal Caffeine/alcohol intake: minimal (1 cup)  Daily voice use: minimal  On a scale of 1-5 where 1= very little and 5= excessive, how would you characterize your daily average voice use? 1 On a scale of 0-5, where 0= never and 5= always, how often do you do the following?  Shout or scream 0 Talk loudly 1 Talk a lot 0 Talk over noise 0 Use the phone 0 Sing 0 Surgical history: prolonged intubation  PHYSICAL SYMPTOMS: Do you have any burning, soreness, tickling, or irritation in your throat? No Do you sometimes have a sensation of a lump in your throat?  Yes  "once a day" for phlegm/"oyster"  Do you have any aching or tightness in your throat? No Do you ever feel tension in your neck area?  No Does your voice get tired easily? No Do you feel as if you have to strain to produce voice? No Do you feel as if you need to cough or clear your throat a lot? Yes - reported wife comments on increased coughing Do you ever lose your voice completely? No Do you ever have difficulty swallowing? No Do you have difficulty projecting your voice? No  VOCAL ABUSE:  Episodes observed during evaluation: throat clears x3 Identified triggers: Acid reflux , GERD  PERCEPTUAL VOICE ASSESSMENT: Voice quality: hoarse, rough, and gravelly Vocal abuse: habitual throat  clearing Resonance: normal Respiratory function: thoracic breathing and clavicular breathing  OBJECTIVE VOICE ASSESSMENT: Maximum phonation time for sustained "ah": 27 seconds  Conversational loudness average: 67 dB Conversational loudness range: 63-73 dB S/z ratio: 0.7 (Suggestive of dysfunction >1.0)  Pt does not report difficulty with swallowing, which does not warrant further evaluation  ORAL MOTOR EXAMINATION: Overall status: WFL  PATIENT REPORTED OUTCOME MEASURES (PROM): V-RQOL: 15  TODAY'S TREATMENT:                                                                                                                                         02/08/24: Initiated education and instruction of PhoRTE today, with handout provided. Occasional min-mod A provided to reduce strain, optimize breath support, and increase forward projection during structured exercises. Hoarseness decreased during tasks and discourse with trained techniques.   02/01/24: ST evaluation and POC complete. Educated patient on role of SLP related to voice and swallowing. Pt denied dysphagia. Educated patient on recent MBSS results and recommendations, including multiple dry swallows to clear pharyngeal residue. Pt verbalized understanding and carryover of recommendation. Briefly introduced plan to train high intensity voice exercises to address VF atrophy. Pt verbalized understanding and agreeable to trial ST session x1.    PATIENT EDUCATION: Education details: see above Person educated: Patient Education method: Explanation Education comprehension: verbalized understanding and needs further education  HOME EXERCISE PROGRAM: PhoRTE  GOALS: Goals reviewed with patient? Yes  SHORT TERM GOALS: Target date: 02/29/2024  Pt will complete daily HEP x2 sessions Baseline: Goal status: INITIAL  2.  Pt will accurately complete targeted voice exercises given occasional min A  Baseline:  Goal status: INITIAL  3.  Pt will  demonstrate improved vocal quality in structured conversation given occasional min A Baseline:  Goal status: INITIAL   LONG TERM GOALS: Target date: 04/11/2024  Pt will complete daily HEP > 1 week  Baseline:  Goal status: INITIAL  2.  Pt will demonstrate improved vocal quality in unstructured conversations x2 given occasional min A  Baseline:  Goal status: INITIAL  3.  Pt will subjectively report improved voicing via PROM by 2 points by LTG date Baseline: 15 Goal status: INITIAL  4.  Pt will complete swallow exercise program (if warranted) given occasional min A   Baseline:  Goal status: INITIAL  ASSESSMENT:  CLINICAL IMPRESSION: Patient is a 89 y.o. M who was seen today for voice evaluation. Evaluation reveals moderate dysphonia. Pt's voice is c/b hoarse, rough vocal quality. Pt reports voice change secondary to prolonged intubation related to surgery complications ~3 years ago. Endorsed wife has difficulty understanding him due to change in vocal quality. Denied dysphagia, with use of multiple swallows as needed. MBSS completed with no additional f/u indicated. Pt would benefit from skilled ST to address aforementioned deficits to enhance communication efficacy.    OBJECTIVE IMPAIRMENTS: include voice disorder. These impairments are limiting patient from effectively communicating at home and in community. Factors affecting potential to achieve goals and functional outcome are ability to learn/carryover information and previous level of function. Patient will benefit from skilled SLP services to address above impairments and improve overall function.  REHAB POTENTIAL: Good  PLAN:  SLP FREQUENCY: 1x/week  SLP DURATION: 10 weeks (extended for scheduling)  PLANNED INTERVENTIONS: Cueing hierachy, Internal/external aids, Functional tasks, SLP instruction and feedback, Compensatory strategies, Patient/family education, and [address] Treatment of speech (30 or 45 min)     Tamar Fairly, CCC-SLP 02/08/2024, 2:08 PM

## 2024-02-08 NOTE — Patient Instructions (Signed)
  PHoRTE - twice a day  [email or web address]   10 Loud AH's as loud as you can and as long as you can  2. 10 Pitch glides up, 10 pitch glides down   3. 10 sentences in loud high pitch voice, like you are calling your neighbor over the phone  4. 10 sentences in loud low authoritative pitch, like you are the boss  Use a good belly breath before each exercise- feel your abs contract in as you use your voice

## 2024-03-09 ENCOUNTER — Institutional Professional Consult (permissible substitution) (INDEPENDENT_AMBULATORY_CARE_PROVIDER_SITE_OTHER): Admitting: Otolaryngology

## 2024-03-13 NOTE — Therapy (Unsigned)
 OUTPATIENT SPEECH LANGUAGE PATHOLOGY VOICE TREATMENT   Patient Name: Gary Rice MRN: [id number] DOB:1931-07-21, 89 y.o., male Today's Date: 03/14/2024  PCP: Ronna Coho MD REFERRING PROVIDER: Artice Last, MD  END OF SESSION:  End of Session - 03/14/24 1406     Visit Number 3    Number of Visits 9    Date for SLP Re-Evaluation 04/11/24    Authorization Type Medicare    SLP Start Time 1406   arrived late   SLP Stop Time  1445    SLP Time Calculation (min) 39 min    Activity Tolerance Patient tolerated treatment well            Past Medical History:  Diagnosis Date   Astigmatism    Basal cell carcinoma    Inguinal hernia    bilateral   Macular degeneration, age related, nonexudative    bilateral   SVT (supraventricular tachycardia) (HCC)    Past Surgical History:  Procedure Laterality Date   APPENDECTOMY     age 54   BOWEL RESECTION  05/09/2021   Procedure: SMALL BOWEL RESECTION;  Surgeon: Enid Harry, MD;  Location: [facility] OR;  Service: General;;   CATARACT EXTRACTION, BILATERAL     HERNIA REPAIR     RIH   INGUINAL HERNIA REPAIR Right 05/02/2021   Procedure: REPAIR  INCARCERATED INGUINAL HERNIA  WITH MESH;  Surgeon: Anda Bamberg, MD;  Location: [facility] OR;  Service: General;  Laterality: Right;   JOINT REPLACEMENT Right    RTHA   LAPAROSCOPY N/A 05/09/2021   Procedure: LAPAROSCOPY DIAGNOSTIC;  Surgeon: Enid Harry, MD;  Location: [facility] OR;  Service: General;  Laterality: N/A;   LAPAROTOMY  05/09/2021   Procedure: EXPLORATORY LAPAROTOMY;  Surgeon: Enid Harry, MD;  Location: [facility] OR;  Service: General;;   TONSILLECTOMY     Patient Active Problem List   Diagnosis Date Noted   Syncope 02/23/2022   Paroxysmal A-fib (HCC) 02/23/2022   Head injury 02/23/2022   Ileus (HCC) 06/08/2021   Cholecystitis, acute    Acute blood loss anemia    History of supraventricular tachycardia    Hyponatremia    Debility 05/26/2021   Pelvic abscess in male  [facility]) 05/14/2021   Malnutrition of moderate degree 05/12/2021   Atrial fibrillation with rapid ventricular response (HCC) 05/02/2021   SBO (small bowel obstruction) (HCC) 05/02/2021   Leukocytosis 05/02/2021   Hyperlipidemia 05/02/2021   Thoracic aortic aneurysm (HCC) 05/02/2021   Incarcerated hernia     Onset date: 01/13/24 (referral date)  REFERRING DIAG: R13.10 (ICD-10-CM) - Dysphagia, unspecified type 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 Re: HEP Pt accompanied by: self  PERTINENT HISTORY: Gary Rice is a 89 year old male who presents with persistent hoarseness and voice changes following prolonged intubation.    He has experienced persistent hoarseness and voice changes since being intubated due to a bowel obstruction three years ago. The intubation was prolonged due to complications, including an infection. Since then, his voice has significantly changed, described as 'haywire,' making communication difficult at times. There has been no significant improvement in his voice over time.   He has been using reflux medications recently, which have provided some improvement over the past two days. He experiences daily episodes of mucus or  drainage, which he refers to as 'oysters,' that he needs to dislodge by coughing, providing temporary relief almost every day. He has not yet tried salt water  gargles.   He reports significant weight loss from 175 pounds to 144-145 pounds since his hospitalization three years ago, attributing this to muscle loss from inactivity and reduced food intake/exercise. He notes decreased activity levels and has not addressed this issue this year.   His past medical history includes a  history of allergies, which were severe during childhood but improved after he began smoking while in the service in [place] . He smoked for less than ten years and has not had significant allergy issues since, except for one incident during a camping trip with the [employer].   No history of aspiration pneumonia. When eating, he sometimes regurgitates undigested food, which he then chews and swallows again.  PAIN:  Are you having pain? No  FALLS: Has patient fallen in last 6 months? No  LIVING ENVIRONMENT: Lives with: lives with their family Lives in: House/apartment  PLOF:Level of assistance: Independent with ADLs, Independent with IADLs Employment: Retired  PATIENT GOALS: improve voice quality   OBJECTIVE:  Note: Objective measures were completed at Evaluation unless otherwise noted.  DIAGNOSTIC FINDINGS: Procedure: The base of tongue was visualized and was symmetric with no apparent masses or abnormal appearing tissue. There were no signs of a mass or pooling of secretions in the piriform sinuses. The supraglottic structures were normal. The true vocal cords are mobile. The medial edges were bowed. Closure was incomplete. Periodicity present. The mucosal wave and amplitude were normal and symmetric. There is moderate interarytenoid pachydermia and post cricoid edema. The mucosa appears without lesions.   The laryngoscope was then slowly withdrawn and the patient tolerated the procedure well. There were no complications or blood loss.  MBSS (01/31/24): Francoise Ishihara presents with an oropharyngeal dysphagia consisting of decreased anterior hyoid excursion, diminshed pharyngeal stripping wave, incomplete laryngeal vestibule closure and reduced duration of PES segment opening. This resulted in mild-moderate amount of pharyngeal residuals remaining in vallecular sinus, pyriform sinus and posterior pharyngeal wall after initial swallow was completed. Amount of pharyngeal residuals increased as  viscosity of liquids and density of solids increased. In addition, suspected prominent cricopharyngeal bar restricted barium transit. Patient able to reduce amount of pharyngeal residuals with two-three additional dry swallows. When given a sip of thin liquid barium while he had mechanical soft solid barium residuals in pharynx, this resulted in flash, shallow penetration (PAS 2) of thin liquids. No aspiration observed with any of the tested barium consistencies during any phase of the swallow. SLP educated patient and he verbalized agreement.   COGNITION: Overall cognitive status: Within functional limits for tasks assessed  SOCIAL HISTORY: Occupation: Retired  Water  intake: suboptimal Caffeine/alcohol intake: minimal (1 cup)  Daily voice use: minimal  On a scale of 1-5 where 1= very little and 5= excessive, how would you characterize your daily average voice use? 1 On a scale of 0-5, where 0= never and 5= always, how often do you do the following?  Shout or scream 0 Talk loudly 1 Talk a lot 0 Talk over noise 0 Use the phone 0 Sing 0 Surgical history: prolonged intubation  PHYSICAL SYMPTOMS: Do you have any burning, soreness, tickling, or irritation in your throat? No Do you sometimes have a sensation of a lump in your throat?  Yes  once a day for phlegm/oyster  Do you have any aching or tightness in your throat?  No Do you ever feel tension in your neck area? No Does your voice get tired easily? No Do you feel as if you have to strain to produce voice? No Do you feel as if you need to cough or clear your throat a lot? Yes - reported wife comments on increased coughing Do you ever lose your voice completely? No Do you ever have difficulty swallowing? No Do you have difficulty projecting your voice? No  VOCAL ABUSE:  Episodes observed during evaluation: throat clears x3 Identified triggers: Acid reflux , GERD  PERCEPTUAL VOICE ASSESSMENT: Voice quality: hoarse, rough, and  gravelly Vocal abuse: habitual throat clearing Resonance: normal Respiratory function: thoracic breathing and clavicular breathing  OBJECTIVE VOICE ASSESSMENT: Maximum phonation time for sustained ah: 27 seconds  Conversational loudness average: 67 dB Conversational loudness range: 63-73 dB S/z ratio: 0.7 (Suggestive of dysfunction >1.0)  Pt does not report difficulty with swallowing, which does not warrant further evaluation  ORAL MOTOR EXAMINATION: Overall status: WFL  PATIENT REPORTED OUTCOME MEASURES (PROM): V-RQOL: 15  TODAY'S TREATMENT:                                                                                                                                         03/14/24: Has not been completing HEP as recommended. Re-instructed PhoRTE with usual model and mod A required to optimize projection, maximize intensity, and modify pitch range. Introduced SOVT exercises, with usual model and mod A provided to optimize performance. Following exercises, SLP appreciated instances of improved vocal quality. Recommended coming to therapy more consistently to aid comprehension and carryover of targeted techniques.   02/08/24: Initiated education and instruction of PhoRTE today, with handout provided. Occasional min-mod A provided to reduce strain, optimize breath support, and increase forward projection during structured exercises. Hoarseness decreased during tasks and discourse with trained techniques.   02/01/24: ST evaluation and POC complete. Educated patient on role of SLP related to voice and swallowing. Pt denied dysphagia. Educated patient on recent MBSS results and recommendations, including multiple dry swallows to clear pharyngeal residue. Pt verbalized understanding and carryover of recommendation. Briefly introduced plan to train high intensity voice exercises to address VF atrophy. Pt verbalized understanding and agreeable to trial ST session x1.    PATIENT  EDUCATION: Education details: see above Person educated: Patient Education method: Explanation Education comprehension: verbalized understanding and needs further education  HOME EXERCISE PROGRAM: PhoRTE  GOALS: Goals reviewed with patient? Yes  SHORT TERM GOALS: Target date: 02/29/2024  Pt will complete daily HEP x2 sessions Baseline: Goal status: NOT MET  2.  Pt will accurately complete targeted voice exercises given occasional min A  Baseline:  Goal status: NOT MET  3.  Pt will demonstrate improved vocal quality in structured conversation given occasional min A Baseline:  Goal status: PARTIALLY MET   LONG TERM GOALS: Target date: 04/11/2024  Pt will complete daily HEP >  1 week  Baseline:  Goal status: INITIAL  2.  Pt will demonstrate improved vocal quality in unstructured conversations x2 given occasional min A  Baseline:  Goal status: INITIAL  3.  Pt will subjectively report improved voicing via PROM by 2 points by LTG date Baseline: 15 Goal status: INITIAL  4.  Pt will complete swallow exercise program (if warranted) given occasional min A   Baseline:  Goal status: INITIAL  ASSESSMENT:  CLINICAL IMPRESSION: Patient is a 89 y.o. M who was seen today for voice evaluation. Evaluation reveals moderate dysphonia. Pt's voice is c/b hoarse, rough vocal quality. Pt reports voice change secondary to prolonged intubation related to surgery complications ~3 years ago. Endorsed wife has difficulty understanding him due to change in vocal quality. Denied dysphagia, with use of multiple swallows as needed. MBSS completed with no additional f/u indicated. Pt would benefit from skilled ST to address aforementioned deficits to enhance communication efficacy.    OBJECTIVE IMPAIRMENTS: include voice disorder. These impairments are limiting patient from effectively communicating at home and in community. Factors affecting potential to achieve goals and functional outcome are ability  to learn/carryover information and previous level of function. Patient will benefit from skilled SLP services to address above impairments and improve overall function.  REHAB POTENTIAL: Good  PLAN:  SLP FREQUENCY: 1x/week  SLP DURATION: 10 weeks (extended for scheduling)  PLANNED INTERVENTIONS: Cueing hierachy, Internal/external aids, Functional tasks, SLP instruction and feedback, Compensatory strategies, Patient/family education, and [id number] Treatment of speech (30 or 45 min)     Tamar Fairly, CCC-SLP 03/14/2024, 2:58 PM

## 2024-03-14 ENCOUNTER — Ambulatory Visit: Payer: Self-pay | Attending: Otolaryngology

## 2024-03-14 DIAGNOSIS — J383 Other diseases of vocal cords: Secondary | ICD-10-CM | POA: Insufficient documentation

## 2024-03-14 DIAGNOSIS — R490 Dysphonia: Secondary | ICD-10-CM | POA: Insufficient documentation

## 2024-03-14 NOTE — Patient Instructions (Signed)
 Semi-occluded vocal tract exercises (SOVTE)  These allow your vocal folds to vibrate without excess tension and promotes high placement of the voice  Use SOVTE as a warm up before prolonged speaking and vocal exercises  Watch Vocal Straw Exercises with Lenox Ahr on YouTube: [email or web address]  Make sure your lips are rounded and sealed around straw   Exercises: (2-3x each exercise)  Blow air through straw Hum through straw  Hum (low to high pitch) through straw Hum (high to low pitch) through straw   Hum "[employer]" through [employer] "happy birthday" through [employer] air through straw into water  Hum through straw into water  Hum (low to high pitch) through straw into water  Hum (high to low pitch) through straw into water   Hum "[employer]" through straw into water   Hum "happy birthday" through straw into water   A goal would be 2-3 minutes several times a day and prior to vocal exercises  As always, use good belly breathing while completing SOVTE

## 2024-03-21 ENCOUNTER — Institutional Professional Consult (permissible substitution) (INDEPENDENT_AMBULATORY_CARE_PROVIDER_SITE_OTHER): Admitting: Otolaryngology

## 2024-03-22 DIAGNOSIS — Z961 Presence of intraocular lens: Secondary | ICD-10-CM | POA: Diagnosis not present

## 2024-03-22 DIAGNOSIS — H18513 Endothelial corneal dystrophy, bilateral: Secondary | ICD-10-CM | POA: Diagnosis not present

## 2024-03-22 DIAGNOSIS — H353132 Nonexudative age-related macular degeneration, bilateral, intermediate dry stage: Secondary | ICD-10-CM | POA: Diagnosis not present

## 2024-03-22 DIAGNOSIS — H53412 Scotoma involving central area, left eye: Secondary | ICD-10-CM | POA: Diagnosis not present

## 2024-03-22 DIAGNOSIS — H35373 Puckering of macula, bilateral: Secondary | ICD-10-CM | POA: Diagnosis not present

## 2024-03-23 ENCOUNTER — Ambulatory Visit (INDEPENDENT_AMBULATORY_CARE_PROVIDER_SITE_OTHER): Admitting: Otolaryngology

## 2024-03-23 ENCOUNTER — Encounter (INDEPENDENT_AMBULATORY_CARE_PROVIDER_SITE_OTHER): Payer: Self-pay | Admitting: Otolaryngology

## 2024-03-23 VITALS — BP 136/81

## 2024-03-23 DIAGNOSIS — R490 Dysphonia: Secondary | ICD-10-CM

## 2024-03-23 DIAGNOSIS — R1310 Dysphagia, unspecified: Secondary | ICD-10-CM | POA: Diagnosis not present

## 2024-03-23 DIAGNOSIS — J383 Other diseases of vocal cords: Secondary | ICD-10-CM | POA: Diagnosis not present

## 2024-03-23 NOTE — Progress Notes (Signed)
 ENT Progress Note:   Update 03/23/2024  Discussed the use of AI scribe software for clinical note transcription with the patient, who gave verbal consent to proceed.  History of Present Illness  He returns after swallow study and voice therapy with speech. Recent modified barium swallow study indicated that he requires multiple swallows to clear food, although there was no evidence of aspiration. He experiences residuals due to difficulties with hyolaryngeal movement during swallowing. During an esophagram, he struggled to swallow a barium pill, and it was noted that he had mild dysmotility. He generally does not have swallowing problems except when taking a specific supplement in the morning, which occasionally gets stuck. He takes all his pills with food.  He has been experiencing voice changes and was previously noted to have thin vocal cords. He has attended two sessions of voice therapy but reports no significant improvement.   Records Reviewed:  Initial Evaluation  Reason for Consult: dysphonia    HPI: Discussed the use of AI scribe software for clinical note transcription with the patient, who gave verbal consent to proceed.  History of Present Illness Gary Rice is a 89 year old male who presents with persistent hoarseness and voice changes following prolonged intubation.   He has experienced persistent hoarseness and voice changes since being intubated due to a bowel obstruction three years ago. The intubation was prolonged due to complications, including an infection. Since then, his voice has significantly changed, described as 'haywire,' making communication difficult at times. There has been no significant improvement in his voice over time.  He has been using reflux medications recently, which have provided some improvement over the past two days. He experiences daily episodes of mucus or drainage, which he refers to as 'oysters,' that he needs to dislodge by coughing,  providing temporary relief almost every day. He has not yet tried salt water  gargles.  He reports significant weight loss from 175 pounds to 144-145 pounds since his hospitalization three years ago, attributing this to muscle loss from inactivity and reduced food intake/exercise. He notes decreased activity levels and has not addressed this issue this year.  His past medical history includes a history of allergies, which were severe during childhood but improved after he began smoking while in the service in [place] . He smoked for less than ten years and has not had significant allergy issues since, except for one incident during a camping trip with the [employer].  No history of aspiration pneumonia. When eating, he sometimes regurgitates undigested food, which he then chews and swallows again.   Gary Rice is a 89 year old male who presents with swallowing difficulties and voice changes.    Records Reviewed:  [facility] office 01/10/24 The patient is a 89 year old male here today for a follow-up visit. He was seen on 11/24/2023 for follow-up on congestion and voice issues. 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 Dr. Maggie, MD had reviewed his last scope. It was suggested that he consider getting SLP voice training, checking on some LPR triggers, and possibly pursuing speech therapy.  He has not yet initiated speech therapy due to communication issues with the provider, as he tends to disregard calls from unknown numbers. He reports no significant changes in his condition since the last visit. He continues to consume coffee and beer, with a particular fondness for 2 beers on Saturday nights. He has not made any dietary modifications  since the previous consultation.  He has a lifelong history of rhinorrhea, which he attributes to childhood allergies and asthma. He occasionally uses  Allegra for his runny nose, which typically manifests upon waking and is not associated with food intake. He also experiences clear nasal discharge, which he has had for over 60 years. He recalls a sinus vacuum procedure performed approximately 60 years ago, which did not provide relief. He has not sought consultation with an allergist for his nasal drip. His rhinorrhea is intermittent, with periods of several days without symptoms.   Pulm office visit 01/11/24 He is a 89 year old male who presents with chronic hoarseness and sinus issues following intubation during abdominal surgery two years ago. He was referred by [facility] for evaluation of chronic cough and hoarseness.   Two years ago, he underwent emergency abdominal surgery for a bowel blockage, during which he was intubated and remained on a ventilator for approximately two and a half weeks. Since extubation, he has experienced persistent hoarseness.   He developed a sinus problem characterized by the production of a large, egg yolk-like sputum, which he refers to as an 'oyster', occurring once every day or every two days. Previous analysis of this sputum was reportedly normal.   There is a decrease in the frequency of mucus buildup and coughing, although his wife has difficulty hearing him due to his hoarse voice. No cough waking him at night, shortness of breath, sinus drainage, or runny nose, although he has a history of lifelong mild sinus drainage.   He uses nasal drops periodically for sinus issues and has seen an ear, nose, and throat specialist who performed a laryngoscopy, revealing no significant abnormalities in vocal cord movement.   No history of asthma or bronchitis, although he had asthma as a child, which resolved after he began smoking. He quit smoking in 1968 after approximately ten years of use, smoking about a pack a day.   No reflux, heartburn, or worsening of cough with eating or talking. He has not tried any over-the-counter  treatments for his symptoms.  Chronic Hoarseness most likely related to post nasal drainage Chronic hoarseness post-extubation likely due to post-nasal drip irritating vocal cords. ENT evaluation showed no vocal cord paralysis or abnormal motion. CT scan showed no chronic lung disease or scarring. - Start ipratropium nasal spray twice daily, may increase to four times daily if needed. - Perform warm salt water  gargles. - Refer to laryngologist Dr. Lilla Callejo for further evaluation.   Past Medical History:  Diagnosis Date   Astigmatism    Basal cell carcinoma    Inguinal hernia    bilateral   Macular degeneration, age related, nonexudative    bilateral   SVT (supraventricular tachycardia) (HCC)     Past Surgical History:  Procedure Laterality Date   APPENDECTOMY     age 65   BOWEL RESECTION  05/09/2021   Procedure: SMALL BOWEL RESECTION;  Surgeon: Ebbie Cough, MD;  Location: [facility] OR;  Service: General;;   CATARACT EXTRACTION, BILATERAL     HERNIA REPAIR     RIH   INGUINAL HERNIA REPAIR Right 05/02/2021   Procedure: REPAIR  INCARCERATED INGUINAL HERNIA  WITH MESH;  Surgeon: Paola Dreama SAILOR, MD;  Location: [facility] OR;  Service: General;  Laterality: Right;   JOINT REPLACEMENT Right    RTHA   LAPAROSCOPY N/A 05/09/2021   Procedure: LAPAROSCOPY DIAGNOSTIC;  Surgeon: Ebbie Cough, MD;  Location: [facility] OR;  Service: General;  Laterality: N/A;   LAPAROTOMY  05/09/2021  Procedure: EXPLORATORY LAPAROTOMY;  Surgeon: Ebbie Cough, MD;  Location: [facility] OR;  Service: General;;   TONSILLECTOMY      Family History  Problem Relation Age of Onset   Heart disease Father    Heart attack Father    Heart attack Son    Sleep apnea Son     Social History:  reports that he quit smoking about 57 years ago. His smoking use included cigarettes. He started smoking about 67 years ago. He has a 10 pack-year smoking history. He has never used smokeless tobacco. He reports that he does not drink  alcohol and does not use drugs.  Allergies: No Known Allergies  Medications: I have reviewed the patient's current medications.  The PMH, PSH, Medications, Allergies, and SH were reviewed and updated.  ROS: Constitutional: Negative for fever, weight loss and weight gain. Cardiovascular: Negative for chest pain and dyspnea on exertion. Respiratory: Is not experiencing shortness of breath at rest. Gastrointestinal: Negative for nausea and vomiting. Neurological: Negative for headaches. Psychiatric: The patient is not nervous/anxious  Blood pressure 136/81. There is no height or weight on file to calculate BMI.  PHYSICAL EXAM:  Exam: General: Well-developed, well-nourished Communication and Voice: raspy Respiratory Respiratory effort: Equal inspiration and expiration without stridor Cardiovascular Peripheral Vascular: Warm extremities with equal color/perfusion Eyes: No nystagmus with equal extraocular motion bilaterally Neuro/Psych/Balance: Patient oriented to person, place, and time; Appropriate mood and affect; Gait is intact with no imbalance; Cranial nerves I-XII are intact Head and Face Inspection: Normocephalic and atraumatic without mass or lesion Palpation: Facial skeleton intact without bony stepoffs Salivary Glands: No mass or tenderness Facial Strength: Facial motility symmetric and full bilaterally ENT Pinna: External ear intact and fully developed External canal: Canal is patent with intact skin Tympanic Membrane: Clear and mobile External Nose: No scar or anatomic deformity Lips, Teeth, and gums: Mucosa and teeth intact and viable TMJ: No pain to palpation with full mobility Oral cavity/oropharynx: No erythema or exudate, no lesions present Neck Neck and Trachea: Midline trachea without mass or lesion Thyroid : No mass or nodularity Lymphatics: No lymphadenopathy    Studies Reviewed: Esophagram and MBS 01/31/24 Clinical Impression: Gary Rice presents with  an oropharyngeal dysphagia consisting of decreased anterior hyoid excursion, diminshed pharyngeal stripping wave, incomplete laryngeal vestibule closure and reduced duration of PES segment opening. This resulted in mild-moderate amount of pharyngeal residuals remaining in vallecular sinus, pyriform sinus and posterior pharyngeal wall after initial swallow was completed. Amount of pharyngeal residuals increased as viscosity of liquids and density of solids increased. In addition, suspected prominent cricopharyngeal bar restricted barium transit. Patient able to reduce amount of pharyngeal residuals with two-three additional dry swallows. When given a sip of thin liquid barium while he had mechanical soft solid barium residuals in pharynx, this resulted in flash, shallow penetration (PAS 2) of thin liquids. No aspiration observed with any of the tested barium consistencies during any phase of the swallow. SLP educated patient and he verbalized agreement.   Esophagram 01/31/24 Narrative & Impression  FINDINGS: Repeat evaluation of the pharynx was not performed. Examination of the thoracic esophagus was performed in the semi erect and prone positions.   The patient swallowed the barium without difficulty. No aspiration was observed. There is mild esophageal dysmotility without significant tertiary contractions. No evidence of hiatal hernia or focal mucosal abnormality.   Patient attempted but was unable to swallow the barium tablet.   IMPRESSION: 1. No significant abnormality of the thoracic esophagus identified. 2. Mild esophageal  dysmotility. 3. Patient unable to swallow the barium tablet.      Assessment/Plan: Encounter Diagnoses  Name Primary?   Dysphagia, unspecified type Yes   Age-related vocal fold atrophy    Glottic insufficiency    Dysphonia      Assessment and Plan Assessment & Plan Chronic dysphonia, Vocal cord atrophy and glottic insufficiency Chronic hoarseness and voice  changes for 2-3 years following prolonged intubation for several weeks due to SBO and complications, strobe today with evidence of  vocal cord atrophy/glottic insufficiency with incomplete closure and pooling of secretions in left>right pyriforms. Discussed interventions including vocal cord filler injection augmentation. We also discussed a need to evaluate swallowing  - Order swallow study to assess aspiration risk and esophageal conditions 2/2 hx of food regurgitation (MBS esophagram) - Refer to voice/swallow therapy to improve swallowing and vocal cord function. - Consider vocal cord filler injection augmentation if therapy is insufficient.  Dysphagia Difficulty swallowing with sensation of mucus in throat. Pooling of secretions on vocal cords and pyriforms, suggests oropharyngeal vs esophageal dysphagia. Swallow study to determine aspiration discussed - Order swallow study to evaluate swallowing. - Refer to swallow therapy to improve swallowing techniques and safety.  Weight loss Gradual weight loss from 175 lbs to 144 lbs over three years, likely due to decreased oral intake and reduced activity. May contribute to vocal cord atrophy. - Monitor weight and nutritional intake. - Encourage increased oral intake and activity as tolerated.   Update 03/23/2024  Assessment & Plan Dysphagia Dysphagia with residuals on modified barium swallow study, no aspiration. Esophagram showed delayed esophageal transit, consistent with age-related changes.  - Offer swallow therapy to strengthen swallowing muscles. - Advise practicing multiple swallows and eating foods that are easier to swallow. 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 Previous voice therapy without significant improvement. Phlegm affects voice clarity, improves after clearing. - Consider vocal cord filler injection in the future, currently  he does not want to pursue it - Encourage continuation of voice exercises.   Elena Larry, MD Otolaryngology [facility] Phone: [phone] Fax: [phone]    03/23/2024, 1:58 PM

## 2024-04-17 NOTE — Therapy (Signed)
 OUTPATIENT SPEECH LANGUAGE PATHOLOGY VOICE TREATMENT (RECERT)   Patient Name: Gary Rice MRN: [id number] DOB:1931-07-11, 89 y.o., male Today's Date: 04/18/2024  PCP: Kip Righter MD REFERRING PROVIDER: Okey Burns, MD  END OF SESSION:  End of Session - 04/18/24 1404     Visit Number 4    Number of Visits 9    Date for SLP Re-Evaluation 05/16/24   recert   Authorization Type Medicare    SLP Start Time 1404    SLP Stop Time  1445    SLP Time Calculation (min) 41 min    Activity Tolerance Patient tolerated treatment well             Past Medical History:  Diagnosis Date   Astigmatism    Basal cell carcinoma    Inguinal hernia    bilateral   Macular degeneration, age related, nonexudative    bilateral   SVT (supraventricular tachycardia) (HCC)    Past Surgical History:  Procedure Laterality Date   APPENDECTOMY     age 69   BOWEL RESECTION  05/09/2021   Procedure: SMALL BOWEL RESECTION;  Surgeon: Ebbie Cough, MD;  Location: [facility] OR;  Service: General;;   CATARACT EXTRACTION, BILATERAL     HERNIA REPAIR     RIH   INGUINAL HERNIA REPAIR Right 05/02/2021   Procedure: REPAIR  INCARCERATED INGUINAL HERNIA  WITH MESH;  Surgeon: Paola Dreama SAILOR, MD;  Location: [facility] OR;  Service: General;  Laterality: Right;   JOINT REPLACEMENT Right    RTHA   LAPAROSCOPY N/A 05/09/2021   Procedure: LAPAROSCOPY DIAGNOSTIC;  Surgeon: Ebbie Cough, MD;  Location: [facility] OR;  Service: General;  Laterality: N/A;   LAPAROTOMY  05/09/2021   Procedure: EXPLORATORY LAPAROTOMY;  Surgeon: Ebbie Cough, MD;  Location: [facility] OR;  Service: General;;   TONSILLECTOMY     Patient Active Problem List   Diagnosis Date Noted   Syncope 02/23/2022   Paroxysmal A-fib (HCC) 02/23/2022   Head injury 02/23/2022   Ileus (HCC) 06/08/2021   Cholecystitis, acute    Acute blood loss anemia    History of supraventricular tachycardia    Hyponatremia    Debility 05/26/2021   Pelvic abscess in  male [facility]) 05/14/2021   Malnutrition of moderate degree 05/12/2021   Atrial fibrillation with rapid ventricular response (HCC) 05/02/2021   SBO (small bowel obstruction) (HCC) 05/02/2021   Leukocytosis 05/02/2021   Hyperlipidemia 05/02/2021   Thoracic aortic aneurysm (HCC) 05/02/2021   Incarcerated hernia     Onset date: 01/13/24 (referral date)  REFERRING DIAG: R13.10 (ICD-10-CM) - Dysphagia, unspecified type 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 I'm trying re: HEP  Pt accompanied by: self  PERTINENT HISTORY: Gary Rice is a 89 year old male who presents with persistent hoarseness and voice changes following prolonged intubation.    He has experienced persistent hoarseness and voice changes since being intubated due to a bowel obstruction three years ago. The intubation was prolonged due to complications, including an infection. Since then, his voice has significantly changed, described as 'haywire,' making communication difficult at times. There has been no significant improvement in his voice over time.   He has been using reflux medications recently, which have provided some improvement over the past two days. He experiences daily episodes of mucus  or drainage, which he refers to as 'oysters,' that he needs to dislodge by coughing, providing temporary relief almost every day. He has not yet tried salt water  gargles.   He reports significant weight loss from 175 pounds to 144-145 pounds since his hospitalization three years ago, attributing this to muscle loss from inactivity and reduced food intake/exercise. He notes decreased activity levels and has not addressed this issue this year.   His past  medical history includes a history of allergies, which were severe during childhood but improved after he began smoking while in the service in [place] . He smoked for less than ten years and has not had significant allergy issues since, except for one incident during a camping trip with the [employer].   No history of aspiration pneumonia. When eating, he sometimes regurgitates undigested food, which he then chews and swallows again.  PAIN:  Are you having pain? No  FALLS: Has patient fallen in last 6 months? No  LIVING ENVIRONMENT: Lives with: lives with their family Lives in: House/apartment  PLOF:Level of assistance: Independent with ADLs, Independent with IADLs Employment: Retired  PATIENT GOALS: improve voice quality   OBJECTIVE:  Note: Objective measures were completed at Evaluation unless otherwise noted.  DIAGNOSTIC FINDINGS: Procedure: The base of tongue was visualized and was symmetric with no apparent masses or abnormal appearing tissue. There were no signs of a mass or pooling of secretions in the piriform sinuses. The supraglottic structures were normal. The true vocal cords are mobile. The medial edges were bowed. Closure was incomplete. Periodicity present. The mucosal wave and amplitude were normal and symmetric. There is moderate interarytenoid pachydermia and post cricoid edema. The mucosa appears without lesions.   The laryngoscope was then slowly withdrawn and the patient tolerated the procedure well. There were no complications or blood loss.  MBSS (01/31/24): Gary Rice presents with an oropharyngeal dysphagia consisting of decreased anterior hyoid excursion, diminshed pharyngeal stripping wave, incomplete laryngeal vestibule closure and reduced duration of PES segment opening. This resulted in mild-moderate amount of pharyngeal residuals remaining in vallecular sinus, pyriform sinus and posterior pharyngeal wall after initial swallow was completed. Amount of pharyngeal  residuals increased as viscosity of liquids and density of solids increased. In addition, suspected prominent cricopharyngeal bar restricted barium transit. Patient able to reduce amount of pharyngeal residuals with two-three additional dry swallows. When given a sip of thin liquid barium while he had mechanical soft solid barium residuals in pharynx, this resulted in flash, shallow penetration (PAS 2) of thin liquids. No aspiration observed with any of the tested barium consistencies during any phase of the swallow. SLP educated patient and he verbalized agreement.   COGNITION: Overall cognitive status: Within functional limits for tasks assessed  SOCIAL HISTORY: Occupation: Retired  Water  intake: suboptimal Caffeine/alcohol intake: minimal (1 cup)  Daily voice use: minimal  On a scale of 1-5 where 1= very little and 5= excessive, how would you characterize your daily average voice use? 1 On a scale of 0-5, where 0= never and 5= always, how often do you do the following?  Shout or scream 0 Talk loudly 1 Talk a lot 0 Talk over noise 0 Use the phone 0 Sing 0 Surgical history: prolonged intubation  PHYSICAL SYMPTOMS: Do you have any burning, soreness, tickling, or irritation in your throat? No Do you sometimes have a sensation of a lump in your throat?  Yes  once a day for phlegm/oyster  Do you have any aching or tightness in your  throat? No Do you ever feel tension in your neck area? No Does your voice get tired easily? No Do you feel as if you have to strain to produce voice? No Do you feel as if you need to cough or clear your throat a lot? Yes - reported wife comments on increased coughing Do you ever lose your voice completely? No Do you ever have difficulty swallowing? No Do you have difficulty projecting your voice? No  VOCAL ABUSE:  Episodes observed during evaluation: throat clears x3 Identified triggers: Acid reflux , GERD  PERCEPTUAL VOICE ASSESSMENT: Voice quality:  hoarse, rough, and gravelly Vocal abuse: habitual throat clearing Resonance: normal Respiratory function: thoracic breathing and clavicular breathing  OBJECTIVE VOICE ASSESSMENT: Maximum phonation time for sustained ah: 27 seconds  Conversational loudness average: 67 dB Conversational loudness range: 63-73 dB S/z ratio: 0.7 (Suggestive of dysfunction >1.0)  Pt does not report difficulty with swallowing, which does not warrant further evaluation  ORAL MOTOR EXAMINATION: Overall status: WFL  PATIENT REPORTED OUTCOME MEASURES (PROM): V-RQOL: 15  TODAY'S TREATMENT:                                                                                                                                         04/18/24: Endorsed intermittent completion of HEP. Rare moments of vocal quality appreciated within opening conversation. Introduced Tourist information centre manager exercises, with pt able to demo with rare-occasional min A. Subsequent structured conversation of 5-6 minutes was ~70% clear with occasional min A. Re-educated importance of consistent practice to generalize forward resonance and projection. Updated HEP.   03/14/24: Has not been completing HEP as recommended. Re-instructed PhoRTE with usual model and mod A required to optimize projection, maximize intensity, and modify pitch range. Introduced SOVT exercises, with usual model and mod A provided to optimize performance. Following exercises, SLP appreciated instances of improved vocal quality. Recommended coming to therapy more consistently to aid comprehension and carryover of targeted techniques.   02/08/24: Initiated education and instruction of PhoRTE today, with handout provided. Occasional min-mod A provided to reduce strain, optimize breath support, and increase forward projection during structured exercises. Hoarseness decreased during tasks and discourse with trained techniques.   02/01/24: ST evaluation and POC complete. Educated patient on role of  SLP related to voice and swallowing. Pt denied dysphagia. Educated patient on recent MBSS results and recommendations, including multiple dry swallows to clear pharyngeal residue. Pt verbalized understanding and carryover of recommendation. Briefly introduced plan to train high intensity voice exercises to address VF atrophy. Pt verbalized understanding and agreeable to trial ST session x1.    PATIENT EDUCATION: Education details: see above Person educated: Patient Education method: Explanation Education comprehension: verbalized understanding and needs further education  HOME EXERCISE PROGRAM: PhoRTE  GOALS: Goals reviewed with patient? Yes  SHORT TERM GOALS: Target date: 02/29/2024  Pt will complete daily HEP x2 sessions Baseline: Goal status: NOT MET  2.  Pt will accurately complete targeted voice exercises given occasional min A  Baseline:  Goal status: NOT MET  3.  Pt will demonstrate improved vocal quality in structured conversation given occasional min A Baseline:  Goal status: PARTIALLY MET   LONG TERM GOALS: Target date: 04/11/2024 (05/16/2024 for recert)  Pt will complete daily HEP > 1 week  Baseline:  Goal status: ONGOING (at recert)  2.  Pt will demonstrate improved vocal quality in unstructured conversations x2 given occasional min A  Baseline:  Goal status: ONGOING (at recert)  3.  Pt will subjectively report improved voicing via PROM by 2 points by LTG date Baseline: 15 Goal status: ONGOING (at recert)  4.  Pt will complete swallow exercise program given occasional min A   Baseline:  Goal status: ONGOING (at recert)  ASSESSMENT:  CLINICAL IMPRESSION: Patient is a 89 y.o. M who was seen today for voice tx. Presents with ongoing moderate dysphonia if trained techniques not implemented. When cued to use clear voice and forward projection, pt can demonstrate improved vocal clarity. Denied dysphagia, with use of multiple swallows as needed. MBSS completed with  no additional f/u indicated. ENT recommended swallow therapy, which he declined. Given limited sessions, pt would continue to benefit from skilled ST to address aforementioned deficits to enhance communication efficacy.    OBJECTIVE IMPAIRMENTS: include voice disorder. These impairments are limiting patient from effectively communicating at home and in community. Factors affecting potential to achieve goals and functional outcome are ability to learn/carryover information and previous level of function. Patient will benefit from skilled SLP services to address above impairments and improve overall function.  REHAB POTENTIAL: Good  PLAN:  SLP FREQUENCY: 1x/week  SLP DURATION: 10 weeks (+ 4 weeks for recert)  PLANNED INTERVENTIONS: Cueing hierachy, Internal/external aids, Functional tasks, SLP instruction and feedback, Compensatory strategies, Patient/family education, and [id number] Treatment of speech (30 or 45 min)     Comer LILLETTE Louder, CCC-SLP 04/18/2024, 2:50 PM

## 2024-04-18 ENCOUNTER — Ambulatory Visit: Attending: Otolaryngology

## 2024-04-18 DIAGNOSIS — J383 Other diseases of vocal cords: Secondary | ICD-10-CM | POA: Diagnosis not present

## 2024-04-18 DIAGNOSIS — R1312 Dysphagia, oropharyngeal phase: Secondary | ICD-10-CM | POA: Insufficient documentation

## 2024-04-18 DIAGNOSIS — R490 Dysphonia: Secondary | ICD-10-CM | POA: Diagnosis not present

## 2024-04-18 NOTE — Patient Instructions (Signed)
 Resonant Voice Exercises:  1 Take a deep breath and produce a smooth, steady hum. Try to make a sound between "h" and "m". Feel for a buzz in the lips and nose.  2 Take a good belly breath and hum. Then move it gently into a vowel.  Hmmmeeee   Hmmmoooo   Hmmmohhh   Hmmmahhh   Hmmmyyyy   Hmmmayyy  3 Try moving from the hum into a single word. Keep humming into the word, it will sound like a chant.   Try Hmmmm +.. Money, Monkey, Mother, Milk, Musical, Mountain, Monster, [employer], [place], Music, Mystery, Map  4  Now take the hum and same list of words. This time hum, then try a conversational tone with the word. It should still sound smooth, but not like a chant.  5 Fade the hum and just say the words. Keep using a smooth, easy voice. If you notice roughness or hoarseness returning use a hum to return to smooth voice.  6 Again, take the list of words above. Hum, and then combine two words from the list to make a phrase. Keep humming in both words to make the chant sound.  7 Try the same phrases in a conversational voice. Keep using your hum to cue yourself into smooth voice.  8 Fade the hum and just say the phrase. Once you master this, try to change the pitch slightly. Vary it up and down, but don't go too low.  9 Hum then say the following phrases and sentences. Start by using the chant speech, then fade and just say the sentence. Keep your smooth voice.  Ronal made me mad.  My mother made marmalade.  My merry mom made marmalade.  My merry mom may marry Marv.  Marv made my mother merry.  Meet my mom. Never nap at night. Mice might make more money. Meet me at the movies. Number one, number two (continue to twenty).  Many moaning men--many, many moaning men.  Come away, come away, come. Make room, make room---the king!

## 2024-04-24 NOTE — Therapy (Unsigned)
 OUTPATIENT SPEECH LANGUAGE PATHOLOGY VOICE TREATMENT ([facility])   Patient Name: Gary Rice MRN: [id number] DOB:08-Jul-1931, 89 y.o., male Today's Date: 04/24/2024  PCP: Kip Righter MD REFERRING PROVIDER: Okey Burns, MD  END OF SESSION:       Past Medical History:  Diagnosis Date   Astigmatism    Basal cell carcinoma    Inguinal hernia    bilateral   Macular degeneration, age related, nonexudative    bilateral   SVT (supraventricular tachycardia) [facility])    Past Surgical History:  Procedure Laterality Date   APPENDECTOMY     age 67   BOWEL RESECTION  05/09/2021   Procedure: SMALL BOWEL RESECTION;  Surgeon: Ebbie Cough, MD;  Location: [facility] OR;  Service: General;;   CATARACT EXTRACTION, BILATERAL     HERNIA REPAIR     RIH   INGUINAL HERNIA REPAIR Right 05/02/2021   Procedure: REPAIR  INCARCERATED INGUINAL HERNIA  WITH MESH;  Surgeon: Paola Dreama SAILOR, MD;  Location: [facility] OR;  Service: General;  Laterality: Right;   JOINT REPLACEMENT Right    RTHA   LAPAROSCOPY N/A 05/09/2021   Procedure: LAPAROSCOPY DIAGNOSTIC;  Surgeon: Ebbie Cough, MD;  Location: [facility] OR;  Service: General;  Laterality: N/A;   LAPAROTOMY  05/09/2021   Procedure: EXPLORATORY LAPAROTOMY;  Surgeon: Ebbie Cough, MD;  Location: [facility] OR;  Service: General;;   TONSILLECTOMY     Patient Active Problem List   Diagnosis Date Noted   Syncope 02/23/2022   Paroxysmal A-fib (HCC) 02/23/2022   Head injury 02/23/2022   Ileus (HCC) 06/08/2021   Cholecystitis, acute    Acute blood loss anemia    History of supraventricular tachycardia    Hyponatremia    Debility 05/26/2021   Pelvic abscess in male [facility]) 05/14/2021   Malnutrition of moderate degree 05/12/2021   Atrial fibrillation with rapid ventricular response (HCC) 05/02/2021   SBO (small bowel obstruction) (HCC) 05/02/2021   Leukocytosis 05/02/2021   Hyperlipidemia 05/02/2021   Thoracic aortic aneurysm (HCC) 05/02/2021   Incarcerated  hernia     Onset date: 01/13/24 (referral date)  REFERRING DIAG: R13.10 (ICD-10-CM) - Dysphagia, unspecified type 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  Rationale for Evaluation and Treatment: Rehabilitation  SUBJECTIVE:   SUBJECTIVE STATEMENT:  Pt accompanied by: self  PERTINENT HISTORY: [name] is a 89 year old male who presents with persistent hoarseness and voice changes following prolonged intubation.    He has experienced persistent hoarseness and voice changes since being intubated due to a bowel obstruction three years ago. The intubation was prolonged due to complications, including an infection. Since then, his voice has significantly changed, described as 'haywire,' making communication difficult at times. There has been no significant improvement in his voice over time.   He has been using reflux medications recently, which have provided some improvement over the past two days. He experiences daily episodes of mucus or drainage, which he refers to as 'oysters,' that he needs to dislodge by coughing, providing temporary relief almost every day. He has not yet tried salt water  gargles.   He reports significant weight loss from 175 pounds to 144-145 pounds since his hospitalization three years ago, attributing this to muscle loss from inactivity and reduced food intake/exercise. He notes decreased activity levels and has not addressed this issue this year.   His past medical history includes a history of allergies, which were severe during childhood  but improved after he began smoking while in the service in [place] . He smoked for less than ten years and has not had significant allergy issues since, except for one incident during a camping trip with the [employer].   No history of aspiration pneumonia. When eating, he sometimes regurgitates undigested  food, which he then chews and swallows again.  PAIN:  Are you having pain? No  FALLS: Has patient fallen in last 6 months? No  LIVING ENVIRONMENT: Lives with: lives with their family Lives in: House/apartment  PLOF:Level of assistance: Independent with ADLs, Independent with IADLs Employment: Retired  PATIENT GOALS: improve voice quality   OBJECTIVE:  Note: Objective measures were completed at Evaluation unless otherwise noted.  DIAGNOSTIC FINDINGS: Procedure: The base of tongue was visualized and was symmetric with no apparent masses or abnormal appearing tissue. There were no signs of a mass or pooling of secretions in the piriform sinuses. The supraglottic structures were normal. The true vocal cords are mobile. The medial edges were bowed. Closure was incomplete. Periodicity present. The mucosal wave and amplitude were normal and symmetric. There is moderate interarytenoid pachydermia and post cricoid edema. The mucosa appears without lesions.   The laryngoscope was then slowly withdrawn and the patient tolerated the procedure well. There were no complications or blood loss.  MBSS (01/31/24): [name] presents with an oropharyngeal dysphagia consisting of decreased anterior hyoid excursion, diminshed pharyngeal stripping wave, incomplete laryngeal vestibule closure and reduced duration of PES segment opening. This resulted in mild-moderate amount of pharyngeal residuals remaining in vallecular sinus, pyriform sinus and posterior pharyngeal wall after initial swallow was completed. Amount of pharyngeal residuals increased as viscosity of liquids and density of solids increased. In addition, suspected prominent cricopharyngeal bar restricted barium transit. Patient able to reduce amount of pharyngeal residuals with two-three additional dry swallows. When given a sip of thin liquid barium while he had mechanical soft solid barium residuals in pharynx, this resulted in flash, shallow  penetration (PAS 2) of thin liquids. No aspiration observed with any of the tested barium consistencies during any phase of the swallow. SLP educated patient and he verbalized agreement.   COGNITION: Overall cognitive status: Within functional limits for tasks assessed  SOCIAL HISTORY: Occupation: Retired  Water  intake: suboptimal Caffeine/alcohol intake: minimal (1 cup)  Daily voice use: minimal  On a scale of 1-5 where 1= very little and 5= excessive, how would you characterize your daily average voice use? 1 On a scale of 0-5, where 0= never and 5= always, how often do you do the following?  Shout or scream 0 Talk loudly 1 Talk a lot 0 Talk over noise 0 Use the phone 0 Sing 0 Surgical history: prolonged intubation  PHYSICAL SYMPTOMS: Do you have any burning, soreness, tickling, or irritation in your throat? No Do you sometimes have a sensation of a lump in your throat?  Yes  once a day for phlegm/oyster  Do you have any aching or tightness in your throat? No Do you ever feel tension in your neck area? No Does your voice get tired easily? No Do you feel as if you have to strain to produce voice? No Do you feel as if you need to cough or clear your throat a lot? Yes - reported wife comments on increased coughing Do you ever lose your voice completely? No Do you ever have difficulty swallowing? No Do you have difficulty projecting your voice? No  VOCAL ABUSE:  Episodes observed during evaluation: throat clears  x3 Identified triggers: Acid reflux , GERD  PERCEPTUAL VOICE ASSESSMENT: Voice quality: hoarse, rough, and gravelly Vocal abuse: habitual throat clearing Resonance: normal Respiratory function: thoracic breathing and clavicular breathing  OBJECTIVE VOICE ASSESSMENT: Maximum phonation time for sustained ah: 27 seconds  Conversational loudness average: 67 dB Conversational loudness range: 63-73 dB S/z ratio: 0.7 (Suggestive of dysfunction >1.0)  Pt does not  report difficulty with swallowing, which does not warrant further evaluation  ORAL MOTOR EXAMINATION: Overall status: WFL  PATIENT REPORTED OUTCOME MEASURES (PROM): V-RQOL: 15  TODAY'S TREATMENT:                                                                                                                                         04/25/24:  04/18/24: Endorsed intermittent completion of HEP. Rare moments of vocal quality appreciated within opening conversation. Introduced Tourist information centre manager exercises, with pt able to demo with rare-occasional min A. Subsequent structured conversation of 5-6 minutes was ~70% clear with occasional min A. Re-educated importance of consistent practice to generalize forward resonance and projection. Updated HEP.   03/14/24: Has not been completing HEP as recommended. Re-instructed PhoRTE with usual model and mod A required to optimize projection, maximize intensity, and modify pitch range. Introduced SOVT exercises, with usual model and mod A provided to optimize performance. Following exercises, SLP appreciated instances of improved vocal quality. Recommended coming to therapy more consistently to aid comprehension and carryover of targeted techniques.   02/08/24: Initiated education and instruction of PhoRTE today, with handout provided. Occasional min-mod A provided to reduce strain, optimize breath support, and increase forward projection during structured exercises. Hoarseness decreased during tasks and discourse with trained techniques.   02/01/24: ST evaluation and POC complete. Educated patient on role of SLP related to voice and swallowing. Pt denied dysphagia. Educated patient on recent MBSS results and recommendations, including multiple dry swallows to clear pharyngeal residue. Pt verbalized understanding and carryover of recommendation. Briefly introduced plan to train high intensity voice exercises to address VF atrophy. Pt verbalized understanding and agreeable to  trial ST session x1.    PATIENT EDUCATION: Education details: see above Person educated: Patient Education method: Explanation Education comprehension: verbalized understanding and needs further education  HOME EXERCISE PROGRAM: PhoRTE  GOALS: Goals reviewed with patient? Yes  SHORT TERM GOALS: Target date: 02/29/2024  Pt will complete daily HEP x2 sessions Baseline: Goal status: NOT MET  2.  Pt will accurately complete targeted voice exercises given occasional min A  Baseline:  Goal status: NOT MET  3.  Pt will demonstrate improved vocal quality in structured conversation given occasional min A Baseline:  Goal status: PARTIALLY MET   LONG TERM GOALS: Target date: 04/11/2024 (05/16/2024 for recert)  Pt will complete daily HEP > 1 week  Baseline:  Goal status: ONGOING (at recert)  2.  Pt will demonstrate improved vocal quality in unstructured conversations x2 given occasional min  A  Baseline:  Goal status: ONGOING (at recert)  3.  Pt will subjectively report improved voicing via PROM by 2 points by LTG date Baseline: 15 Goal status: ONGOING (at recert)  4.  Pt will complete swallow exercise program given occasional min A   Baseline:  Goal status: ONGOING (at recert)  ASSESSMENT:  CLINICAL IMPRESSION: Patient is a 89 y.o. M who was seen today for voice tx. Presents with ongoing moderate dysphonia if trained techniques not implemented. When cued to use clear voice and forward projection, pt can demonstrate improved vocal clarity. Denied dysphagia, with use of multiple swallows as needed. MBSS completed with no additional f/u indicated. ENT recommended swallow therapy, which he declined. Given limited sessions, pt would continue to benefit from skilled ST to address aforementioned deficits to enhance communication efficacy.    OBJECTIVE IMPAIRMENTS: include voice disorder. These impairments are limiting patient from effectively communicating at home and in  community. Factors affecting potential to achieve goals and functional outcome are ability to learn/carryover information and previous level of function. Patient will benefit from skilled SLP services to address above impairments and improve overall function.  REHAB POTENTIAL: Good  PLAN:  SLP FREQUENCY: 1x/week  SLP DURATION: 10 weeks (+ 4 weeks for recert)  PLANNED INTERVENTIONS: Cueing hierachy, Internal/external aids, Functional tasks, SLP instruction and feedback, Compensatory strategies, Patient/family education, and [id number] Treatment of speech (30 or 45 min)     Comer LILLETTE Louder, CCC-SLP 04/24/2024, 1:46 PM

## 2024-04-25 ENCOUNTER — Ambulatory Visit

## 2024-04-25 DIAGNOSIS — R1312 Dysphagia, oropharyngeal phase: Secondary | ICD-10-CM | POA: Diagnosis not present

## 2024-04-25 DIAGNOSIS — J383 Other diseases of vocal cords: Secondary | ICD-10-CM | POA: Diagnosis not present

## 2024-04-25 DIAGNOSIS — R490 Dysphonia: Secondary | ICD-10-CM

## 2024-04-25 NOTE — Patient Instructions (Signed)
 Speech Homework:  Loud ah x5  Change pitch high to low ah x5  Change pitch low to high ah x5  Read in a low projected voice x10 sentences  Practice reading aloud 5 minutes, twice a day    Avoid mint/menthol   Avoid clearing your throat   You should sound LOUD to yourself! Project your voice!

## 2024-05-01 NOTE — Therapy (Unsigned)
 OUTPATIENT SPEECH LANGUAGE PATHOLOGY VOICE TREATMENT    Patient Name: Gary Rice MRN: [id number] DOB:05-03-1931, 89 y.o., male Today's Date: 05/02/2024  PCP: Kip Righter MD REFERRING PROVIDER: Okey Burns, MD  END OF SESSION:  End of Session - 05/02/24 1403     Visit Number 6    Number of Visits 9    Date for SLP Re-Evaluation 05/16/24    Authorization Type Medicare    SLP Start Time 1405    SLP Stop Time  1445    SLP Time Calculation (min) 40 min    Activity Tolerance Patient tolerated treatment well               Past Medical History:  Diagnosis Date   Astigmatism    Basal cell carcinoma    Inguinal hernia    bilateral   Macular degeneration, age related, nonexudative    bilateral   SVT (supraventricular tachycardia) (HCC)    Past Surgical History:  Procedure Laterality Date   APPENDECTOMY     age 8   BOWEL RESECTION  05/09/2021   Procedure: SMALL BOWEL RESECTION;  Surgeon: Ebbie Cough, MD;  Location: [facility] OR;  Service: General;;   CATARACT EXTRACTION, BILATERAL     HERNIA REPAIR     RIH   INGUINAL HERNIA REPAIR Right 05/02/2021   Procedure: REPAIR  INCARCERATED INGUINAL HERNIA  WITH MESH;  Surgeon: Paola Dreama SAILOR, MD;  Location: [facility] OR;  Service: General;  Laterality: Right;   JOINT REPLACEMENT Right    RTHA   LAPAROSCOPY N/A 05/09/2021   Procedure: LAPAROSCOPY DIAGNOSTIC;  Surgeon: Ebbie Cough, MD;  Location: [facility] OR;  Service: General;  Laterality: N/A;   LAPAROTOMY  05/09/2021   Procedure: EXPLORATORY LAPAROTOMY;  Surgeon: Ebbie Cough, MD;  Location: [facility] OR;  Service: General;;   TONSILLECTOMY     Patient Active Problem List   Diagnosis Date Noted   Syncope 02/23/2022   Paroxysmal A-fib (HCC) 02/23/2022   Head injury 02/23/2022   Ileus (HCC) 06/08/2021   Cholecystitis, acute    Acute blood loss anemia    History of supraventricular tachycardia    Hyponatremia    Debility 05/26/2021   Pelvic abscess in male [facility])  05/14/2021   Malnutrition of moderate degree 05/12/2021   Atrial fibrillation with rapid ventricular response (HCC) 05/02/2021   SBO (small bowel obstruction) (HCC) 05/02/2021   Leukocytosis 05/02/2021   Hyperlipidemia 05/02/2021   Thoracic aortic aneurysm (HCC) 05/02/2021   Incarcerated hernia     Onset date: 01/13/24 (referral date)  REFERRING DIAG: R13.10 (ICD-10-CM) - Dysphagia, unspecified type 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    He has experienced persistent hoarseness and voice changes since being intubated due to a bowel obstruction three years ago. The intubation was prolonged due to complications, including an infection. Since then, his voice has significantly changed, described as 'haywire,' making communication difficult at times. There has been no significant improvement in his voice over time.   He has been using reflux medications recently, which have provided some improvement over the past two days. He experiences daily episodes of mucus or drainage, which he refers to  as 'oysters,' that he needs to dislodge by coughing, providing temporary relief almost every day. He has not yet tried salt water  gargles.   He reports significant weight loss from 175 pounds to 144-145 pounds since his hospitalization three years ago, attributing this to muscle loss from inactivity and reduced food intake/exercise. He notes decreased activity levels and has not addressed this issue this year.   His past medical history includes a history of allergies, which  were severe during childhood but improved after he began smoking while in the service in [place] . He smoked for less than ten years and has not had significant allergy issues since, except for one incident during a camping trip with the [employer].   No history of aspiration pneumonia. When eating, he sometimes regurgitates undigested food, which he then chews and swallows again.  PAIN:  Are you having pain? No  FALLS: Has patient fallen in last 6 months? No  LIVING ENVIRONMENT: Lives with: lives with their family Lives in: House/apartment  PLOF:Level of assistance: Independent with ADLs, Independent with IADLs Employment: Retired  PATIENT GOALS: improve voice quality   OBJECTIVE:  Note: Objective measures were completed at Evaluation unless otherwise noted.  DIAGNOSTIC FINDINGS: Procedure: The base of tongue was visualized and was symmetric with no apparent masses or abnormal appearing tissue. There were no signs of a mass or pooling of secretions in the piriform sinuses. The supraglottic structures were normal. The true vocal cords are mobile. The medial edges were bowed. Closure was incomplete. Periodicity present. The mucosal wave and amplitude were normal and symmetric. There is moderate interarytenoid pachydermia and post cricoid edema. The mucosa appears without lesions.   The laryngoscope was then slowly withdrawn and the patient tolerated the procedure well. There were no complications or blood loss.  MBSS (01/31/24): [name] presents with an oropharyngeal dysphagia consisting of decreased anterior hyoid excursion, diminshed pharyngeal stripping wave, incomplete laryngeal vestibule closure and reduced duration of PES segment opening. This resulted in mild-moderate amount of pharyngeal residuals remaining in vallecular sinus, pyriform sinus and posterior pharyngeal wall after initial swallow was completed. Amount of pharyngeal residuals increased as viscosity of liquids and density of  solids increased. In addition, suspected prominent cricopharyngeal bar restricted barium transit. Patient able to reduce amount of pharyngeal residuals with two-three additional dry swallows. When given a sip of thin liquid barium while he had mechanical soft solid barium residuals in pharynx, this resulted in flash, shallow penetration (PAS 2) of thin liquids. No aspiration observed with any of the tested barium consistencies during any phase of the swallow. SLP educated patient and he verbalized agreement.   COGNITION: Overall cognitive status: Within functional limits for tasks assessed  SOCIAL HISTORY: Occupation: Retired  Water  intake: suboptimal Caffeine/alcohol intake: minimal (1 cup)  Daily voice use: minimal  On a scale of 1-5 where 1= very little and 5= excessive, how would you characterize your daily average voice use? 1 On a scale of 0-5, where 0= never and 5= always, how often do you do the following?  Shout or scream 0 Talk loudly 1 Talk a lot 0 Talk over noise 0 Use the phone 0 Sing 0 Surgical history: prolonged intubation  PHYSICAL SYMPTOMS: Do you have any burning, soreness, tickling, or irritation in your throat? No Do you sometimes have a sensation of a lump in your throat?  Yes  once a day for phlegm/oyster  Do you have any aching or tightness in your throat? No Do you ever feel  tension in your neck area? No Does your voice get tired easily? No Do you feel as if you have to strain to produce voice? No Do you feel as if you need to cough or clear your throat a lot? Yes - reported wife comments on increased coughing Do you ever lose your voice completely? No Do you ever have difficulty swallowing? No Do you have difficulty projecting your voice? No  VOCAL ABUSE:  Episodes observed during evaluation: throat clears x3 Identified triggers: Acid reflux , GERD  PERCEPTUAL VOICE ASSESSMENT: Voice quality: hoarse, rough, and gravelly Vocal abuse: habitual throat  clearing Resonance: normal Respiratory function: thoracic breathing and clavicular breathing  OBJECTIVE VOICE ASSESSMENT: Maximum phonation time for sustained ah: 27 seconds  Conversational loudness average: 67 dB Conversational loudness range: 63-73 dB S/z ratio: 0.7 (Suggestive of dysfunction >1.0)  Pt does not report difficulty with swallowing, which does not warrant further evaluation  ORAL MOTOR EXAMINATION: Overall status: WFL  PATIENT REPORTED OUTCOME MEASURES (PROM): V-RQOL: 15  TODAY'S TREATMENT:                                                                                                                                         05/02/24: Reports voice is horrible, although SLP appreciates intermittent improved vocal clarity in opening conversation with cued projection. Reports completion of HEP but SLP questions accuracy of completion d/t observations today. Continues to requires usual mod A for targeted intensity and increased effort to perform recommended exercises accurately. Benefits from verbal and non-verbal cues for projection in side comments and conversations. Improved carryover noted with increased perceptual loudness of himself.   04/25/24: Endorsed ~daily completion of HEP. Entered with hoarse and low volume. Conducted PhoRTE, with usual mod-max A for sustained phonation and pitch glides. Targeted exuberant voice on structured reading and structured conversation, with usual cues required to project. Frequent throat clearing exhibited, with no awareness or response to SLP cues to use throat clear alternatives. Updated HEP.  04/18/24: Endorsed intermittent completion of HEP. Rare moments of vocal quality appreciated within opening conversation. Introduced Tourist information centre manager exercises, with pt able to demo with rare-occasional min A. Subsequent structured conversation of 5-6 minutes was ~70% clear with occasional min A. Re-educated importance of consistent practice to  generalize forward resonance and projection. Updated HEP.   03/14/24: Has not been completing HEP as recommended. Re-instructed PhoRTE with usual model and mod A required to optimize projection, maximize intensity, and modify pitch range. Introduced SOVT exercises, with usual model and mod A provided to optimize performance. Following exercises, SLP appreciated instances of improved vocal quality. Recommended coming to therapy more consistently to aid comprehension and carryover of targeted techniques.   02/08/24: Initiated education and instruction of PhoRTE today, with handout provided. Occasional min-mod A provided to reduce strain, optimize breath support, and increase forward projection during structured exercises. Hoarseness decreased during tasks and discourse with trained  techniques.   02/01/24: ST evaluation and POC complete. Educated patient on role of SLP related to voice and swallowing. Pt denied dysphagia. Educated patient on recent MBSS results and recommendations, including multiple dry swallows to clear pharyngeal residue. Pt verbalized understanding and carryover of recommendation. Briefly introduced plan to train high intensity voice exercises to address VF atrophy. Pt verbalized understanding and agreeable to trial ST session x1.    PATIENT EDUCATION: Education details: see above Person educated: Patient Education method: Explanation Education comprehension: verbalized understanding and needs further education  HOME EXERCISE PROGRAM: PhoRTE  GOALS: Goals reviewed with patient? Yes  SHORT TERM GOALS: Target date: 02/29/2024  Pt will complete daily HEP x2 sessions Baseline: Goal status: NOT MET  2.  Pt will accurately complete targeted voice exercises given occasional min A  Baseline:  Goal status: NOT MET  3.  Pt will demonstrate improved vocal quality in structured conversation given occasional min A Baseline:  Goal status: PARTIALLY MET   LONG TERM GOALS: Target  date: 04/11/2024 (05/16/2024 for recert)  Pt will complete daily HEP > 1 week  Baseline:  Goal status: ONGOING (at recert)  2.  Pt will demonstrate improved vocal quality in unstructured conversations x2 given occasional min A  Baseline:  Goal status: ONGOING (at recert)  3.  Pt will subjectively report improved voicing via PROM by 2 points by LTG date Baseline: 15 Goal status: ONGOING (at recert)  4.  Pt will complete swallow exercise program given occasional min A   Baseline:  Goal status: ONGOING (at recert)  ASSESSMENT:  CLINICAL IMPRESSION: Patient is a 89 y.o. M who was seen today for voice tx. Presents with ongoing moderate dysphonia if trained techniques not implemented. When frequently cued to use clear voice and forward projection, pt can demonstrate improved vocal clarity. Denied dysphagia, with use of multiple swallows as needed. MBSS completed with no additional f/u indicated. ENT recommended swallow therapy, which he declined. Given limited sessions, pt would continue to benefit from skilled ST to address aforementioned deficits to enhance communication efficacy.    OBJECTIVE IMPAIRMENTS: include voice disorder. These impairments are limiting patient from effectively communicating at home and in community. Factors affecting potential to achieve goals and functional outcome are ability to learn/carryover information and previous level of function. Patient will benefit from skilled SLP services to address above impairments and improve overall function.  REHAB POTENTIAL: Good  PLAN:  SLP FREQUENCY: 1x/week  SLP DURATION: 10 weeks (+ 4 weeks for recert)  PLANNED INTERVENTIONS: Cueing hierachy, Internal/external aids, Functional tasks, SLP instruction and feedback, Compensatory strategies, Patient/family education, and [id number] Treatment of speech (30 or 45 min)     Comer LILLETTE Louder, CCC-SLP 05/02/2024, 2:04 PM

## 2024-05-02 ENCOUNTER — Ambulatory Visit: Attending: Otolaryngology

## 2024-05-02 DIAGNOSIS — J383 Other diseases of vocal cords: Secondary | ICD-10-CM | POA: Diagnosis not present

## 2024-05-02 DIAGNOSIS — R490 Dysphonia: Secondary | ICD-10-CM | POA: Diagnosis not present

## 2024-05-08 DIAGNOSIS — E46 Unspecified protein-calorie malnutrition: Secondary | ICD-10-CM | POA: Diagnosis not present

## 2024-05-08 DIAGNOSIS — E785 Hyperlipidemia, unspecified: Secondary | ICD-10-CM | POA: Diagnosis not present

## 2024-05-08 DIAGNOSIS — I4891 Unspecified atrial fibrillation: Secondary | ICD-10-CM | POA: Diagnosis not present

## 2024-05-08 DIAGNOSIS — I7121 Aneurysm of the ascending aorta, without rupture: Secondary | ICD-10-CM | POA: Diagnosis not present

## 2024-05-08 DIAGNOSIS — Z0000 Encounter for general adult medical examination without abnormal findings: Secondary | ICD-10-CM | POA: Diagnosis not present

## 2024-05-09 ENCOUNTER — Encounter: Admitting: Speech Pathology

## 2024-05-10 ENCOUNTER — Encounter: Payer: Self-pay | Admitting: Speech Pathology

## 2024-05-10 ENCOUNTER — Ambulatory Visit: Admitting: Speech Pathology

## 2024-05-10 DIAGNOSIS — R490 Dysphonia: Secondary | ICD-10-CM | POA: Diagnosis not present

## 2024-05-10 DIAGNOSIS — J383 Other diseases of vocal cords: Secondary | ICD-10-CM | POA: Diagnosis not present

## 2024-05-10 NOTE — Patient Instructions (Signed)
   Keep up the exercises daily -   Swallow several times after each bite

## 2024-05-10 NOTE — Therapy (Signed)
 OUTPATIENT SPEECH LANGUAGE PATHOLOGY VOICE TREATMENT & DISCHARGE SUMMARY   Patient Name: Gian Ybarra MRN: [id number] DOB:08/24/1931, 89 y.o., male Today's Date: 05/10/2024  PCP: Kip Righter MD REFERRING PROVIDER: Okey Burns, MD  END OF SESSION:  End of Session - 05/10/24 1422     Visit Number 7    Number of Visits 9    Date for SLP Re-Evaluation 05/16/24    Authorization Type Medicare    SLP Start Time 1406    SLP Stop Time  1445    SLP Time Calculation (min) 39 min    Activity Tolerance Patient tolerated treatment well               Past Medical History:  Diagnosis Date   Astigmatism    Basal cell carcinoma    Inguinal hernia    bilateral   Macular degeneration, age related, nonexudative    bilateral   SVT (supraventricular tachycardia) (HCC)    Past Surgical History:  Procedure Laterality Date   APPENDECTOMY     age 44   BOWEL RESECTION  05/09/2021   Procedure: SMALL BOWEL RESECTION;  Surgeon: Ebbie Cough, MD;  Location: [facility] OR;  Service: General;;   CATARACT EXTRACTION, BILATERAL     HERNIA REPAIR     RIH   INGUINAL HERNIA REPAIR Right 05/02/2021   Procedure: REPAIR  INCARCERATED INGUINAL HERNIA  WITH MESH;  Surgeon: Paola Dreama SAILOR, MD;  Location: [facility] OR;  Service: General;  Laterality: Right;   JOINT REPLACEMENT Right    RTHA   LAPAROSCOPY N/A 05/09/2021   Procedure: LAPAROSCOPY DIAGNOSTIC;  Surgeon: Ebbie Cough, MD;  Location: [facility] OR;  Service: General;  Laterality: N/A;   LAPAROTOMY  05/09/2021   Procedure: EXPLORATORY LAPAROTOMY;  Surgeon: Ebbie Cough, MD;  Location: [facility] OR;  Service: General;;   TONSILLECTOMY     Patient Active Problem List   Diagnosis Date Noted   Syncope 02/23/2022   Paroxysmal A-fib (HCC) 02/23/2022   Head injury 02/23/2022   Ileus (HCC) 06/08/2021   Cholecystitis, acute    Acute blood loss anemia    History of supraventricular tachycardia    Hyponatremia    Debility 05/26/2021   Pelvic  abscess in male [facility]) 05/14/2021   Malnutrition of moderate degree 05/12/2021   Atrial fibrillation with rapid ventricular response (HCC) 05/02/2021   SBO (small bowel obstruction) (HCC) 05/02/2021   Leukocytosis 05/02/2021   Hyperlipidemia 05/02/2021   Thoracic aortic aneurysm (HCC) 05/02/2021   Incarcerated hernia     Onset date: 01/13/24 (referral date)  REFERRING DIAG: R13.10 (ICD-10-CM) - Dysphagia, unspecified type 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    He has experienced persistent hoarseness and voice changes since being intubated due to a bowel obstruction three years ago. The intubation was prolonged due to complications, including an infection. Since then, his voice has significantly changed, described as 'haywire,' making communication difficult at times. There has been no significant improvement in his voice over time.   He has been using reflux medications recently, which have provided some improvement over the past two days. He experiences daily episodes of mucus or drainage, which he refers to as 'oysters,'  that he needs to dislodge by coughing, providing temporary relief almost every day. He has not yet tried salt water  gargles.   He reports significant weight loss from 175 pounds to 144-145 pounds since his hospitalization three years ago, attributing this to muscle loss from inactivity and reduced food intake/exercise. He notes decreased activity levels and has not addressed this issue this year.   His past medical history includes a history of allergies, which were  severe during childhood but improved after he began smoking while in the service in [place] . He smoked for less than ten years and has not had significant allergy issues since, except for one incident during a camping trip with the [employer].   No history of aspiration pneumonia. When eating, he sometimes regurgitates undigested food, which he then chews and swallows again.  PAIN:  Are you having pain? No  FALLS: Has patient fallen in last 6 months? No  LIVING ENVIRONMENT: Lives with: lives with their family Lives in: House/apartment  PLOF:Level of assistance: Independent with ADLs, Independent with IADLs Employment: Retired  PATIENT GOALS: improve voice quality   OBJECTIVE:  Note: Objective measures were completed at Evaluation unless otherwise noted.  DIAGNOSTIC FINDINGS: Procedure: The base of tongue was visualized and was symmetric with no apparent masses or abnormal appearing tissue. There were no signs of a mass or pooling of secretions in the piriform sinuses. The supraglottic structures were normal. The true vocal cords are mobile. The medial edges were bowed. Closure was incomplete. Periodicity present. The mucosal wave and amplitude were normal and symmetric. There is moderate interarytenoid pachydermia and post cricoid edema. The mucosa appears without lesions.   The laryngoscope was then slowly withdrawn and the patient tolerated the procedure well. There were no complications or blood loss.  MBSS (01/31/24): [name] presents with an oropharyngeal dysphagia consisting of decreased anterior hyoid excursion, diminshed pharyngeal stripping wave, incomplete laryngeal vestibule closure and reduced duration of PES segment opening. This resulted in mild-moderate amount of pharyngeal residuals remaining in vallecular sinus, pyriform sinus and posterior pharyngeal wall after initial swallow was completed. Amount of pharyngeal residuals increased as viscosity of liquids and density of  solids increased. In addition, suspected prominent cricopharyngeal bar restricted barium transit. Patient able to reduce amount of pharyngeal residuals with two-three additional dry swallows. When given a sip of thin liquid barium while he had mechanical soft solid barium residuals in pharynx, this resulted in flash, shallow penetration (PAS 2) of thin liquids. No aspiration observed with any of the tested barium consistencies during any phase of the swallow. SLP educated patient and he verbalized agreement.   COGNITION: Overall cognitive status: Within functional limits for tasks assessed  SOCIAL HISTORY: Occupation: Retired  Water  intake: suboptimal Caffeine/alcohol intake: minimal (1 cup)  Daily voice use: minimal  On a scale of 1-5 where 1= very little and 5= excessive, how would you characterize your daily average voice use? 1 On a scale of 0-5, where 0= never and 5= always, how often do you do the following?  Shout or scream 0 Talk loudly 1 Talk a lot 0 Talk over noise 0 Use the phone 0 Sing 0 Surgical history: prolonged intubation  PHYSICAL SYMPTOMS: Do you have any burning, soreness, tickling, or irritation in your throat? No Do you sometimes have a sensation of a lump in your throat?  Yes  once a day for phlegm/oyster  Do you have any aching or tightness in your throat? No Do you ever feel tension in  your neck area? No Does your voice get tired easily? No Do you feel as if you have to strain to produce voice? No Do you feel as if you need to cough or clear your throat a lot? Yes - reported wife comments on increased coughing Do you ever lose your voice completely? No Do you ever have difficulty swallowing? No Do you have difficulty projecting your voice? No  VOCAL ABUSE:  Episodes observed during evaluation: throat clears x3 Identified triggers: Acid reflux , GERD  PERCEPTUAL VOICE ASSESSMENT: Voice quality: hoarse, rough, and gravelly Vocal abuse: habitual throat  clearing Resonance: normal Respiratory function: thoracic breathing and clavicular breathing  OBJECTIVE VOICE ASSESSMENT: Maximum phonation time for sustained ah: 27 seconds  Conversational loudness average: 67 dB Conversational loudness range: 63-73 dB S/z ratio: 0.7 (Suggestive of dysfunction >1.4)  Pt does not report difficulty with swallowing, which does not warrant further evaluation  ORAL MOTOR EXAMINATION: Overall status: WFL  PATIENT REPORTED OUTCOME MEASURES (PROM): V-RQOL: 15  TODAY'S TREATMENT:                                                                                                                                          05/10/24: Richard reports inconsistent completion of HEP - he demonstrates HEP with rare min A, average 78dB.In structured speech task generating sentences he maintained clear phonation and self corrected hoarse voice. In negative practice in 3 automatic speech tasks, then correcting to clear strong voice - with rare min A. He verbalizes swallow precautions with mod I. VRQOL score improved from 15 to 12.5. Excessive throat clearing persists - reviewed throat clear alternatives and recommendation to avoid throat clearing.  D/C ST  05/02/24: Reports voice is horrible, although SLP appreciates intermittent improved vocal clarity in opening conversation with cued projection. Reports completion of HEP but SLP questions accuracy of completion d/t observations today. Continues to requires usual mod A for targeted intensity and increased effort to perform recommended exercises accurately. Benefits from verbal and non-verbal cues for projection in side comments and conversations. Improved carryover noted with increased perceptual loudness of himself.   04/25/24: Endorsed ~daily completion of HEP. Entered with hoarse and low volume. Conducted PhoRTE, with usual mod-max A for sustained phonation and pitch glides. Targeted exuberant voice on structured reading and  structured conversation, with usual cues required to project. Frequent throat clearing exhibited, with no awareness or response to SLP cues to use throat clear alternatives. Updated HEP.  04/18/24: Endorsed intermittent completion of HEP. Rare moments of vocal quality appreciated within opening conversation. Introduced Tourist information centre manager exercises, with pt able to demo with rare-occasional min A. Subsequent structured conversation of 5-6 minutes was ~70% clear with occasional min A. Re-educated importance of consistent practice to generalize forward resonance and projection. Updated HEP.   03/14/24: Has not been completing HEP as recommended. Re-instructed PhoRTE with usual model and mod A required to  optimize projection, maximize intensity, and modify pitch range. Introduced SOVT exercises, with usual model and mod A provided to optimize performance. Following exercises, SLP appreciated instances of improved vocal quality. Recommended coming to therapy more consistently to aid comprehension and carryover of targeted techniques.   02/08/24: Initiated education and instruction of PhoRTE today, with handout provided. Occasional min-mod A provided to reduce strain, optimize breath support, and increase forward projection during structured exercises. Hoarseness decreased during tasks and discourse with trained techniques.   02/01/24: ST evaluation and POC complete. Educated patient on role of SLP related to voice and swallowing. Pt denied dysphagia. Educated patient on recent MBSS results and recommendations, including multiple dry swallows to clear pharyngeal residue. Pt verbalized understanding and carryover of recommendation. Briefly introduced plan to train high intensity voice exercises to address VF atrophy. Pt verbalized understanding and agreeable to trial ST session x1.    PATIENT EDUCATION: Education details: see above Person educated: Patient Education method: Explanation Education comprehension:  verbalized understanding and needs further education  HOME EXERCISE PROGRAM: PhoRTE  GOALS: Goals reviewed with patient? Yes  SHORT TERM GOALS: Target date: 02/29/2024  Pt will complete daily HEP x2 sessions Baseline: Goal status: NOT MET  2.  Pt will accurately complete targeted voice exercises given occasional min A  Baseline:  Goal status: NOT MET  3.  Pt will demonstrate improved vocal quality in structured conversation given occasional min A Baseline:  Goal status: PARTIALLY MET   LONG TERM GOALS: Target date: 04/11/2024 (05/16/2024 for recert)  Pt will complete daily HEP > 1 week  Baseline:  Goal status: NOT MET(at recert)  2.  Pt will demonstrate improved vocal quality in unstructured conversations x2 given occasional min A  Baseline:  Goal status: MET (at recert)  3.  Pt will subjectively report improved voicing via PROM by 2 points by LTG date Baseline: 15 Goal status: MET - 12.5 (at recert)  4.  Pt will complete swallow exercise program given occasional min A   Baseline:  Goal status: DEFERRED(at recert)  ASSESSMENT:  CLINICAL IMPRESSION: Patient is a 89 y.o. M who was seen today for voice tx. Presents with ongoing moderate dysphonia if trained techniques not implemented. When frequently cued to use clear voice and forward projection, pt can demonstrate improved vocal clarity. Denied dysphagia, with use of multiple swallows as needed. He has inconsistently completed HEP - at this time, Charlie carries over clear phonation in short conversation and in structured tasks. He identifies and corrects low hoarse voice with rare min A. Score on VRQOL improved. Goals met, d/c ST. He endorses need to continue HEP for 6-8 more weeks.    OBJECTIVE IMPAIRMENTS: include voice disorder. These impairments are limiting patient from effectively communicating at home and in community. Factors affecting potential to achieve goals and functional outcome are ability to learn/carryover  information and previous level of function. Patient will benefit from skilled SLP services to address above impairments and improve overall function.  REHAB POTENTIAL: Good  PLAN:  SLP FREQUENCY: 1x/week  SLP DURATION: 10 weeks (+ 4 weeks for recert)  PLANNED INTERVENTIONS: Cueing hierachy, Internal/external aids, Functional tasks, SLP instruction and feedback, Compensatory strategies, Patient/family education, and [id number] Treatment of speech (30 or 45 min)   SPEECH THERAPY DISCHARGE SUMMARY  Visits from Start of Care: 7  Current functional level related to goals / functional outcomes: See goals above   Remaining deficits: Intermittent dysphonia   Education / Equipment: HEP for voice, vocal hygiene, throat clear alternatives,  swallow precautions   Patient agrees to discharge. Patient goals were met. Patient is being discharged due to meeting the stated rehab goals..     Tierrah Anastos Ann, CCC-SLP 05/10/2024, 3:02 PM

## 2024-06-05 ENCOUNTER — Inpatient Hospital Stay (HOSPITAL_COMMUNITY)

## 2024-06-05 ENCOUNTER — Other Ambulatory Visit: Payer: Self-pay

## 2024-06-05 ENCOUNTER — Inpatient Hospital Stay (HOSPITAL_COMMUNITY)
Admission: EM | Admit: 2024-06-05 | Discharge: 2024-06-24 | DRG: 335 | Disposition: A | Discharge status: Skilled Nursing Facility | Attending: Internal Medicine | Admitting: Internal Medicine

## 2024-06-05 ENCOUNTER — Emergency Department (HOSPITAL_COMMUNITY)

## 2024-06-05 ENCOUNTER — Encounter (HOSPITAL_COMMUNITY): Payer: Self-pay

## 2024-06-05 DIAGNOSIS — I5032 Chronic diastolic (congestive) heart failure: Secondary | ICD-10-CM | POA: Diagnosis present

## 2024-06-05 DIAGNOSIS — Z452 Encounter for adjustment and management of vascular access device: Secondary | ICD-10-CM | POA: Diagnosis not present

## 2024-06-05 DIAGNOSIS — H3530 Unspecified macular degeneration: Secondary | ICD-10-CM | POA: Diagnosis present

## 2024-06-05 DIAGNOSIS — K5669 Other partial intestinal obstruction: Secondary | ICD-10-CM | POA: Diagnosis not present

## 2024-06-05 DIAGNOSIS — K9171 Accidental puncture and laceration of a digestive system organ or structure during a digestive system procedure: Secondary | ICD-10-CM | POA: Diagnosis not present

## 2024-06-05 DIAGNOSIS — K920 Hematemesis: Secondary | ICD-10-CM | POA: Diagnosis not present

## 2024-06-05 DIAGNOSIS — R112 Nausea with vomiting, unspecified: Secondary | ICD-10-CM | POA: Diagnosis not present

## 2024-06-05 DIAGNOSIS — E871 Hypo-osmolality and hyponatremia: Secondary | ICD-10-CM | POA: Diagnosis not present

## 2024-06-05 DIAGNOSIS — I2489 Other forms of acute ischemic heart disease: Secondary | ICD-10-CM | POA: Diagnosis not present

## 2024-06-05 DIAGNOSIS — I700 Atherosclerosis of aorta: Secondary | ICD-10-CM | POA: Diagnosis not present

## 2024-06-05 DIAGNOSIS — Z87891 Personal history of nicotine dependence: Secondary | ICD-10-CM

## 2024-06-05 DIAGNOSIS — R1310 Dysphagia, unspecified: Secondary | ICD-10-CM | POA: Diagnosis present

## 2024-06-05 DIAGNOSIS — R41841 Cognitive communication deficit: Secondary | ICD-10-CM | POA: Diagnosis not present

## 2024-06-05 DIAGNOSIS — R6520 Severe sepsis without septic shock: Secondary | ICD-10-CM | POA: Diagnosis not present

## 2024-06-05 DIAGNOSIS — J189 Pneumonia, unspecified organism: Secondary | ICD-10-CM | POA: Diagnosis present

## 2024-06-05 DIAGNOSIS — K3189 Other diseases of stomach and duodenum: Secondary | ICD-10-CM | POA: Diagnosis not present

## 2024-06-05 DIAGNOSIS — I4892 Unspecified atrial flutter: Secondary | ICD-10-CM | POA: Diagnosis present

## 2024-06-05 DIAGNOSIS — K439 Ventral hernia without obstruction or gangrene: Secondary | ICD-10-CM | POA: Diagnosis present

## 2024-06-05 DIAGNOSIS — I4891 Unspecified atrial fibrillation: Secondary | ICD-10-CM

## 2024-06-05 DIAGNOSIS — R042 Hemoptysis: Secondary | ICD-10-CM | POA: Diagnosis not present

## 2024-06-05 DIAGNOSIS — R1111 Vomiting without nausea: Secondary | ICD-10-CM | POA: Diagnosis not present

## 2024-06-05 DIAGNOSIS — D62 Acute posthemorrhagic anemia: Secondary | ICD-10-CM | POA: Diagnosis not present

## 2024-06-05 DIAGNOSIS — K8020 Calculus of gallbladder without cholecystitis without obstruction: Secondary | ICD-10-CM | POA: Diagnosis present

## 2024-06-05 DIAGNOSIS — Z7401 Bed confinement status: Secondary | ICD-10-CM | POA: Diagnosis not present

## 2024-06-05 DIAGNOSIS — N179 Acute kidney failure, unspecified: Secondary | ICD-10-CM | POA: Diagnosis present

## 2024-06-05 DIAGNOSIS — I480 Paroxysmal atrial fibrillation: Secondary | ICD-10-CM | POA: Diagnosis present

## 2024-06-05 DIAGNOSIS — E785 Hyperlipidemia, unspecified: Secondary | ICD-10-CM | POA: Diagnosis not present

## 2024-06-05 DIAGNOSIS — Z9841 Cataract extraction status, right eye: Secondary | ICD-10-CM

## 2024-06-05 DIAGNOSIS — I509 Heart failure, unspecified: Secondary | ICD-10-CM | POA: Diagnosis not present

## 2024-06-05 DIAGNOSIS — I959 Hypotension, unspecified: Secondary | ICD-10-CM | POA: Diagnosis present

## 2024-06-05 DIAGNOSIS — Z96641 Presence of right artificial hip joint: Secondary | ICD-10-CM | POA: Diagnosis present

## 2024-06-05 DIAGNOSIS — K56609 Unspecified intestinal obstruction, unspecified as to partial versus complete obstruction: Secondary | ICD-10-CM | POA: Diagnosis not present

## 2024-06-05 DIAGNOSIS — K5652 Intestinal adhesions [bands] with complete obstruction: Principal | ICD-10-CM | POA: Diagnosis present

## 2024-06-05 DIAGNOSIS — R64 Cachexia: Secondary | ICD-10-CM | POA: Diagnosis present

## 2024-06-05 DIAGNOSIS — I471 Supraventricular tachycardia, unspecified: Secondary | ICD-10-CM | POA: Diagnosis not present

## 2024-06-05 DIAGNOSIS — A419 Sepsis, unspecified organism: Secondary | ICD-10-CM | POA: Diagnosis not present

## 2024-06-05 DIAGNOSIS — E876 Hypokalemia: Secondary | ICD-10-CM | POA: Diagnosis not present

## 2024-06-05 DIAGNOSIS — R1084 Generalized abdominal pain: Secondary | ICD-10-CM | POA: Diagnosis not present

## 2024-06-05 DIAGNOSIS — R531 Weakness: Secondary | ICD-10-CM | POA: Diagnosis not present

## 2024-06-05 DIAGNOSIS — K5651 Intestinal adhesions [bands], with partial obstruction: Secondary | ICD-10-CM | POA: Diagnosis not present

## 2024-06-05 DIAGNOSIS — Z7901 Long term (current) use of anticoagulants: Secondary | ICD-10-CM

## 2024-06-05 DIAGNOSIS — Z9049 Acquired absence of other specified parts of digestive tract: Secondary | ICD-10-CM

## 2024-06-05 DIAGNOSIS — D6832 Hemorrhagic disorder due to extrinsic circulating anticoagulants: Secondary | ICD-10-CM | POA: Diagnosis not present

## 2024-06-05 DIAGNOSIS — Z8679 Personal history of other diseases of the circulatory system: Secondary | ICD-10-CM

## 2024-06-05 DIAGNOSIS — K660 Peritoneal adhesions (postprocedural) (postinfection): Secondary | ICD-10-CM | POA: Diagnosis not present

## 2024-06-05 DIAGNOSIS — Z6821 Body mass index (BMI) 21.0-21.9, adult: Secondary | ICD-10-CM

## 2024-06-05 DIAGNOSIS — J90 Pleural effusion, not elsewhere classified: Secondary | ICD-10-CM | POA: Diagnosis not present

## 2024-06-05 DIAGNOSIS — Z79899 Other long term (current) drug therapy: Secondary | ICD-10-CM

## 2024-06-05 DIAGNOSIS — K4091 Unilateral inguinal hernia, without obstruction or gangrene, recurrent: Secondary | ICD-10-CM | POA: Diagnosis present

## 2024-06-05 DIAGNOSIS — T45515A Adverse effect of anticoagulants, initial encounter: Secondary | ICD-10-CM | POA: Diagnosis not present

## 2024-06-05 DIAGNOSIS — R918 Other nonspecific abnormal finding of lung field: Secondary | ICD-10-CM | POA: Diagnosis not present

## 2024-06-05 DIAGNOSIS — E860 Dehydration: Secondary | ICD-10-CM | POA: Diagnosis present

## 2024-06-05 DIAGNOSIS — I10 Essential (primary) hypertension: Secondary | ICD-10-CM | POA: Diagnosis not present

## 2024-06-05 DIAGNOSIS — E43 Unspecified severe protein-calorie malnutrition: Secondary | ICD-10-CM | POA: Diagnosis present

## 2024-06-05 DIAGNOSIS — R059 Cough, unspecified: Secondary | ICD-10-CM | POA: Diagnosis not present

## 2024-06-05 DIAGNOSIS — I712 Thoracic aortic aneurysm, without rupture, unspecified: Secondary | ICD-10-CM | POA: Diagnosis not present

## 2024-06-05 DIAGNOSIS — J9811 Atelectasis: Secondary | ICD-10-CM | POA: Diagnosis not present

## 2024-06-05 DIAGNOSIS — M6281 Muscle weakness (generalized): Secondary | ICD-10-CM | POA: Diagnosis not present

## 2024-06-05 DIAGNOSIS — I808 Phlebitis and thrombophlebitis of other sites: Secondary | ICD-10-CM | POA: Diagnosis not present

## 2024-06-05 DIAGNOSIS — L89316 Pressure-induced deep tissue damage of right buttock: Secondary | ICD-10-CM | POA: Diagnosis not present

## 2024-06-05 DIAGNOSIS — I493 Ventricular premature depolarization: Secondary | ICD-10-CM | POA: Diagnosis not present

## 2024-06-05 DIAGNOSIS — R0603 Acute respiratory distress: Secondary | ICD-10-CM | POA: Diagnosis not present

## 2024-06-05 DIAGNOSIS — R140 Abdominal distension (gaseous): Secondary | ICD-10-CM | POA: Diagnosis not present

## 2024-06-05 DIAGNOSIS — I4819 Other persistent atrial fibrillation: Secondary | ICD-10-CM | POA: Diagnosis present

## 2024-06-05 DIAGNOSIS — K56600 Partial intestinal obstruction, unspecified as to cause: Secondary | ICD-10-CM | POA: Diagnosis not present

## 2024-06-05 DIAGNOSIS — Z8249 Family history of ischemic heart disease and other diseases of the circulatory system: Secondary | ICD-10-CM

## 2024-06-05 DIAGNOSIS — Z66 Do not resuscitate: Secondary | ICD-10-CM | POA: Diagnosis not present

## 2024-06-05 DIAGNOSIS — N3289 Other specified disorders of bladder: Secondary | ICD-10-CM | POA: Diagnosis not present

## 2024-06-05 DIAGNOSIS — R0602 Shortness of breath: Secondary | ICD-10-CM | POA: Diagnosis not present

## 2024-06-05 DIAGNOSIS — Z4682 Encounter for fitting and adjustment of non-vascular catheter: Secondary | ICD-10-CM | POA: Diagnosis not present

## 2024-06-05 DIAGNOSIS — I7121 Aneurysm of the ascending aorta, without rupture: Secondary | ICD-10-CM | POA: Diagnosis not present

## 2024-06-05 DIAGNOSIS — R2689 Other abnormalities of gait and mobility: Secondary | ICD-10-CM | POA: Diagnosis not present

## 2024-06-05 DIAGNOSIS — J690 Pneumonitis due to inhalation of food and vomit: Secondary | ICD-10-CM | POA: Diagnosis not present

## 2024-06-05 DIAGNOSIS — Z636 Dependent relative needing care at home: Secondary | ICD-10-CM

## 2024-06-05 DIAGNOSIS — D6959 Other secondary thrombocytopenia: Secondary | ICD-10-CM | POA: Diagnosis not present

## 2024-06-05 DIAGNOSIS — K6389 Other specified diseases of intestine: Secondary | ICD-10-CM | POA: Diagnosis not present

## 2024-06-05 DIAGNOSIS — Z515 Encounter for palliative care: Secondary | ICD-10-CM | POA: Diagnosis not present

## 2024-06-05 DIAGNOSIS — Z85828 Personal history of other malignant neoplasm of skin: Secondary | ICD-10-CM

## 2024-06-05 DIAGNOSIS — Z9842 Cataract extraction status, left eye: Secondary | ICD-10-CM

## 2024-06-05 DIAGNOSIS — I77810 Thoracic aortic ectasia: Secondary | ICD-10-CM | POA: Diagnosis not present

## 2024-06-05 DIAGNOSIS — R040 Epistaxis: Secondary | ICD-10-CM | POA: Diagnosis not present

## 2024-06-05 DIAGNOSIS — J9601 Acute respiratory failure with hypoxia: Secondary | ICD-10-CM

## 2024-06-05 DIAGNOSIS — N281 Cyst of kidney, acquired: Secondary | ICD-10-CM | POA: Diagnosis not present

## 2024-06-05 DIAGNOSIS — E875 Hyperkalemia: Secondary | ICD-10-CM | POA: Diagnosis present

## 2024-06-05 DIAGNOSIS — J80 Acute respiratory distress syndrome: Secondary | ICD-10-CM | POA: Diagnosis not present

## 2024-06-05 DIAGNOSIS — N178 Other acute kidney failure: Secondary | ICD-10-CM | POA: Diagnosis not present

## 2024-06-05 DIAGNOSIS — L89326 Pressure-induced deep tissue damage of left buttock: Secondary | ICD-10-CM | POA: Diagnosis not present

## 2024-06-05 DIAGNOSIS — Z48815 Encounter for surgical aftercare following surgery on the digestive system: Secondary | ICD-10-CM | POA: Diagnosis not present

## 2024-06-05 DIAGNOSIS — R54 Age-related physical debility: Secondary | ICD-10-CM | POA: Diagnosis present

## 2024-06-05 LAB — CBC WITH DIFFERENTIAL/PLATELET
Abs Immature Granulocytes: 0.02 K/uL (ref 0.00–0.07)
Basophils Absolute: 0 K/uL (ref 0.0–0.1)
Basophils Relative: 0 %
Eosinophils Absolute: 0 K/uL (ref 0.0–0.5)
Eosinophils Relative: 0 %
HCT: 45.8 % (ref 39.0–52.0)
Hemoglobin: 16 g/dL (ref 13.0–17.0)
Immature Granulocytes: 0 %
Lymphocytes Relative: 17 %
Lymphs Abs: 1.5 K/uL (ref 0.7–4.0)
MCH: 31.8 pg (ref 26.0–34.0)
MCHC: 34.9 g/dL (ref 30.0–36.0)
MCV: 91.1 fL (ref 80.0–100.0)
Monocytes Absolute: 1.2 K/uL — ABNORMAL HIGH (ref 0.1–1.0)
Monocytes Relative: 14 %
Neutro Abs: 6.1 K/uL (ref 1.7–7.7)
Neutrophils Relative %: 69 %
Platelets: 238 K/uL (ref 150–400)
RBC: 5.03 MIL/uL (ref 4.22–5.81)
RDW: 13.2 % (ref 11.5–15.5)
WBC: 8.7 K/uL (ref 4.0–10.5)
nRBC: 0 % (ref 0.0–0.2)

## 2024-06-05 LAB — COMPREHENSIVE METABOLIC PANEL WITH GFR
ALT: 22 U/L (ref 0–44)
AST: 37 U/L (ref 15–41)
Albumin: 3.9 g/dL (ref 3.5–5.0)
Alkaline Phosphatase: 50 U/L (ref 38–126)
Anion gap: 20 — ABNORMAL HIGH (ref 5–15)
BUN: 69 mg/dL — ABNORMAL HIGH (ref 8–23)
CO2: 25 mmol/L (ref 22–32)
Calcium: 10 mg/dL (ref 8.9–10.3)
Chloride: 87 mmol/L — ABNORMAL LOW (ref 98–111)
Creatinine, Ser: 1.81 mg/dL — ABNORMAL HIGH (ref 0.61–1.24)
GFR, Estimated: 35 mL/min — ABNORMAL LOW (ref >60)
Glucose, Bld: 134 mg/dL — ABNORMAL HIGH (ref 70–99)
Potassium: 3.9 mmol/L (ref 3.5–5.1)
Sodium: 132 mmol/L — ABNORMAL LOW (ref 135–145)
Total Bilirubin: 1.4 mg/dL — ABNORMAL HIGH (ref 0.0–1.2)
Total Protein: 7.9 g/dL (ref 6.5–8.1)

## 2024-06-05 LAB — PROTIME-INR
INR: 1.4 — ABNORMAL HIGH (ref 0.8–1.2)
Prothrombin Time: 17.5 s — ABNORMAL HIGH (ref 11.4–15.2)

## 2024-06-05 LAB — LIPASE, BLOOD: Lipase: 18 U/L (ref 11–51)

## 2024-06-05 MED ORDER — DILTIAZEM HCL-DEXTROSE 125-5 MG/125ML-% IV SOLN (PREMIX): 5.0000 mg/h | INTRAVENOUS | Status: DC

## 2024-06-05 MED ORDER — ONDANSETRON HCL 4 MG PO TABS: 4.0000 mg | ORAL_TABLET | Freq: Four times a day (QID) | ORAL | Status: DC | PRN

## 2024-06-05 MED ORDER — HYDROMORPHONE HCL 1 MG/ML IJ SOLN
0.5000 mg | INTRAMUSCULAR | Status: DC | PRN
  Administered 2024-06-05 – 2024-06-17 (×6): 1 mg via INTRAVENOUS
  Filled 2024-06-05 (×6): qty 1

## 2024-06-05 MED ORDER — SODIUM CHLORIDE 0.9 % IV BOLUS
1000.0000 mL | Freq: Once | INTRAVENOUS | Status: AC
  Administered 2024-06-05: 1000 mL via INTRAVENOUS

## 2024-06-05 MED ORDER — DILTIAZEM HCL-DEXTROSE 125-5 MG/125ML-% IV SOLN (PREMIX)
5.0000 mg/h | INTRAVENOUS | Status: DC
  Administered 2024-06-05: 5 mg/h via INTRAVENOUS
  Administered 2024-06-06 (×2): 12.5 mg/h via INTRAVENOUS
  Administered 2024-06-07: 7.5 mg/h via INTRAVENOUS
  Administered 2024-06-08: 5 mg/h via INTRAVENOUS
  Administered 2024-06-08: 15 mg/h via INTRAVENOUS
  Administered 2024-06-08: 10 mg/h via INTRAVENOUS
  Administered 2024-06-08: 12.5 mg/h via INTRAVENOUS
  Administered 2024-06-09: 5 mg/h via INTRAVENOUS
  Administered 2024-06-09: 10 mg/h via INTRAVENOUS
  Administered 2024-06-10: 7.5 mg/h via INTRAVENOUS
  Administered 2024-06-10: 10 mg/h via INTRAVENOUS
  Filled 2024-06-05 (×13): qty 125

## 2024-06-05 MED ORDER — ONDANSETRON HCL 4 MG/2ML IJ SOLN: 4.0000 mg | Freq: Four times a day (QID) | INTRAMUSCULAR | Status: DC | PRN

## 2024-06-05 MED ORDER — IOHEXOL 350 MG/ML SOLN
50.0000 mL | Freq: Once | INTRAVENOUS | Status: AC | PRN
  Administered 2024-06-05: 50 mL via INTRAVENOUS

## 2024-06-05 MED ORDER — ONDANSETRON HCL 4 MG/2ML IJ SOLN
4.0000 mg | Freq: Four times a day (QID) | INTRAMUSCULAR | Status: DC | PRN
  Administered 2024-06-05 – 2024-06-22 (×4): 4 mg via INTRAVENOUS
  Filled 2024-06-05 (×4): qty 2

## 2024-06-05 MED ORDER — SODIUM CHLORIDE 0.9% FLUSH
3.0000 mL | Freq: Two times a day (BID) | INTRAVENOUS | Status: DC
  Administered 2024-06-05 – 2024-06-24 (×30): 3 mL via INTRAVENOUS

## 2024-06-05 MED ORDER — SODIUM CHLORIDE 0.9 % IV SOLN: INTRAVENOUS | Status: AC

## 2024-06-05 MED ORDER — HEPARIN (PORCINE) 25000 UT/250ML-% IV SOLN
1050.0000 [IU]/h | INTRAVENOUS | Status: AC
  Administered 2024-06-05: 950 [IU]/h via INTRAVENOUS
  Administered 2024-06-06 – 2024-06-07 (×2): 1050 [IU]/h via INTRAVENOUS
  Filled 2024-06-05 (×3): qty 250

## 2024-06-05 MED ORDER — DILTIAZEM LOAD VIA INFUSION
15.0000 mg | Freq: Once | INTRAVENOUS | Status: AC
  Administered 2024-06-05: 15 mg via INTRAVENOUS
  Filled 2024-06-05: qty 15

## 2024-06-05 NOTE — ED Notes (Signed)
Got patient on the monitor did EKG shown to er provider

## 2024-06-05 NOTE — Progress Notes (Signed)
 PHARMACY - ANTICOAGULATION CONSULT NOTE  Pharmacy Consult for heparin  Indication: atrial fibrillation  No Known Allergies  Patient Measurements:    Vital Signs: Temp: 97.8 F (36.6 C) (09/08 1206) Temp Source: Oral (09/08 1206) BP: 104/91 (09/08 1215) Pulse Rate: 82 (09/08 1215)  Labs: Recent Labs    06/05/24 1241  HGB 16.0  HCT 45.8  PLT 238  LABPROT 17.5*  INR 1.4*  CREATININE 1.81*    CrCl cannot be calculated (Unknown ideal weight.).   Medical History: Past Medical History:  Diagnosis Date   Astigmatism    Basal cell carcinoma    Inguinal hernia    bilateral   Macular degeneration, age related, nonexudative    bilateral   SVT (supraventricular tachycardia) (HCC)      Assessment: 27 YOM presenting with abdominal pain, constipation, in Afib RVR, hx of afib on Eliquis  PTA with last dose this AM, on hold for SBO eval.    Goal of Therapy:  Heparin  level 0.3-0.7 units/ml aPTT 66-102 seconds Monitor platelets by anticoagulation protocol: Yes   Plan:  Heparin  gtt at 950 units/hr, no bolus F/u 8 hour aPTT/HL F/u surg recs and ability to transition back to Eliquis   Dorn Poot, PharmD, [facility] Clinical Pharmacist ED Pharmacist Phone # [phone] 06/05/2024 4:20 PM

## 2024-06-05 NOTE — H&P (Signed)
 History and Physical    [name] [facility] FMW:[id number] DOB: [birth year] DOA: 06/05/2024  PCP: Kip Righter, MD  Patient coming from: Home with wife  Chief Complaint: Constipation  HPI: Gary Rice is a 89 y.o. male with medical history significant of A-fib on Cardizem  and Eliquis , history of SVT, history of inguinal hernia, history of bowel resection in 2022, inguinal hernia repair 2022.  He states that he has been constipated for 4 to 5 days, not passing gas.  Has abdominal pain only when standing.  Was able to take his morning medicines this morning.  He does not feel palpitations.  ED Course: Patient was found to be in A-fib RVR.  CT revealed high-grade SBO.  Review of Systems: As per HPI. Otherwise, all other review of systems reviewed and are negative.   Past Medical History:  Diagnosis Date   Astigmatism    Basal cell carcinoma    Inguinal hernia    bilateral   Macular degeneration, age related, nonexudative    bilateral   SVT (supraventricular tachycardia) (HCC)     Past Surgical History:  Procedure Laterality Date   APPENDECTOMY     age 61   BOWEL RESECTION  05/09/2021   Procedure: SMALL BOWEL RESECTION;  Surgeon: Ebbie Cough, MD;  Location: [facility] OR;  Service: General;;   CATARACT EXTRACTION, BILATERAL     HERNIA REPAIR     RIH   INGUINAL HERNIA REPAIR Right 05/02/2021   Procedure: REPAIR  INCARCERATED INGUINAL HERNIA  WITH MESH;  Surgeon: Paola Dreama SAILOR, MD;  Location: [facility] OR;  Service: General;  Laterality: Right;   JOINT REPLACEMENT Right    RTHA   LAPAROSCOPY N/A 05/09/2021   Procedure: LAPAROSCOPY DIAGNOSTIC;  Surgeon: Ebbie Cough, MD;  Location: [facility] OR;  Service: General;  Laterality: N/A;   LAPAROTOMY  05/09/2021   Procedure: EXPLORATORY LAPAROTOMY;  Surgeon: Ebbie Cough, MD;  Location: [facility] OR;  Service: General;;   TONSILLECTOMY       reports that he quit smoking about 57 years ago. His smoking use included cigarettes. He started  smoking about 67 years ago. He has a 10 pack-year smoking history. He has never used smokeless tobacco. He reports that he does not drink alcohol and does not use drugs.  No Known Allergies  Family History  Problem Relation Age of Onset   Heart disease Father    Heart attack Father    Heart attack Son    Sleep apnea Son     Prior to Admission medications   Medication Sig Start Date End Date Taking? Authorizing Provider  apixaban  (ELIQUIS ) 5 MG TABS tablet Take 1 tablet (5 mg total) by mouth 2 (two) times daily. 06/25/21   Jeffrie Oneil BROCKS, MD  atorvastatin  (LIPITOR) 10 MG tablet Take 10 mg by mouth at bedtime. 08/14/21   [provider]  Cholecalciferol  (VITAMIN D3) 50 MCG (2000 UT) TABS Take 2,000 Units by mouth daily.    [provider]  diltiazem  (CARDIZEM ) 30 MG tablet Take 30 mg by mouth daily as needed (for blood pressure and/or chest pain).    [provider]  fluticasone (FLONASE) 50 MCG/ACT nasal spray as needed for allergies. Patient not taking: Reported on 03/23/2024 10/06/23   [provider]  ipratropium (ATROVENT ) 0.03 % nasal spray Place 2 sprays into both nostrils every 12 (twelve) hours. 01/11/24   Desai, Nikita S, MD  Multiple Vitamin (MULTIVITAMIN WITH MINERALS) TABS tablet Take 1 tablet by mouth daily with breakfast.  [provider]  Multiple Vitamins-Minerals (PRESERVISION AREDS 2) CAPS Take 1 capsule by mouth in the morning and at bedtime.    [provider]  pantoprazole  (PROTONIX ) 40 MG tablet Take 40 mg by mouth as needed (indigestion heartburn or acid reflux).    [provider]  polyethylene glycol (MIRALAX  / GLYCOLAX ) 17 g packet Take 17 g by mouth as needed for mild constipation or moderate constipation.    [provider]  psyllium (METAMUCIL) 58.6 % packet Take 1 packet by mouth daily as needed (for constipation).    [provider]  VITAMIN E PO Take 1 capsule by mouth daily.      [provider]    Physical Exam: Vitals:   06/05/24 1206 06/05/24 1215 06/05/24 1245  BP: 100/78 (!) 104/91   Pulse: (!) 158 82   Resp: 20 (!) 24 20  Temp: 97.8 F (36.6 C)    TempSrc: Oral    SpO2: 94% 94%     Constitutional: NAD, calm, comfortable Eyes: PERRL, lids and conjunctivae normal ENMT: Mucous membranes are dry. Normal dentition.  Respiratory: Clear to auscultation bilaterally, no wheezing, no crackles. Normal respiratory effort. No accessory muscle use. No conversational dyspnea  Cardiovascular: Tachycardic, irregular rhythm.  No edema Abdomen: Soft, nondistended, nontender to palpation. + Bowel sounds Musculoskeletal: No joint deformity upper and lower extremities. No contractures. Normal muscle tone.  Skin: no rashes, lesions, ulcers on exposed skin  Neurologic: Alert and oriented Psychiatric: Normal judgment and insight. Normal mood and affect   Labs on Admission: I have personally reviewed following labs and imaging studies  CBC: Recent Labs  Lab 06/05/24 1241  WBC 8.7  NEUTROABS 6.1  HGB 16.0  HCT 45.8  MCV 91.1  PLT 238   Basic Metabolic Panel: Recent Labs  Lab 06/05/24 1241  NA 132*  K 3.9  CL 87*  CO2 25  GLUCOSE 134*  BUN 69*  CREATININE 1.81*  CALCIUM  10.0   GFR: CrCl cannot be calculated (Unknown ideal weight.). Liver Function Tests: Recent Labs  Lab 06/05/24 1241  AST 37  ALT 22  ALKPHOS 50  BILITOT 1.4*  PROT 7.9  ALBUMIN  3.9   No results for input(s): LIPASE, AMYLASE in the last 168 hours. No results for input(s): AMMONIA in the last 168 hours. Coagulation Profile: Recent Labs  Lab 06/05/24 1241  INR 1.4*   Cardiac Enzymes: No results for input(s): CKTOTAL, CKMB, CKMBINDEX, TROPONINI in the last 168 hours. BNP (last 3 results) No results for input(s): PROBNP in the last 8760 hours. HbA1C: No results for input(s): HGBA1C in the last 72 hours. CBG: No results for input(s): GLUCAP in  the last 168 hours. Lipid Profile: No results for input(s): CHOL, HDL, LDLCALC, TRIG, CHOLHDL, LDLDIRECT in the last 72 hours. Thyroid  Function Tests: No results for input(s): TSH, T4TOTAL, FREET4, T3FREE, THYROIDAB in the last 72 hours. Anemia Panel: No results for input(s): VITAMINB12, FOLATE, FERRITIN, TIBC, IRON , RETICCTPCT in the last 72 hours. Urine analysis:    Component Value Date/Time   COLORURINE YELLOW 02/23/2022 1703   APPEARANCEUR CLEAR 02/23/2022 1703   LABSPEC 1.015 02/23/2022 1703   PHURINE 6.0 02/23/2022 1703   GLUCOSEU NEGATIVE 02/23/2022 1703   HGBUR NEGATIVE 02/23/2022 1703   BILIRUBINUR NEGATIVE 02/23/2022 1703   KETONESUR 5 (A) 02/23/2022 1703   PROTEINUR NEGATIVE 02/23/2022 1703   NITRITE NEGATIVE 02/23/2022 1703   LEUKOCYTESUR NEGATIVE 02/23/2022 1703   Sepsis Labs: !!!!!!!!!!!!!!!!!!!!!!!!!!!!!!!!!!!!!!!!!!!! @LABRCNTIP (procalcitonin:4,lacticidven:4) )No results found for this or  any previous visit (from the past 240 hours).   Radiological Exams on Admission: CT ABDOMEN PELVIS W CONTRAST Result Date: 06/05/2024 EXAM: CT ABDOMEN AND PELVIS WITH CONTRAST 06/05/2024 02:41:01 PM TECHNIQUE: CT of the abdomen and pelvis was performed with the administration of intravenous contrast. Multiplanar reformatted images are provided for review. Automated exposure control, iterative reconstruction, and/or weight-based adjustment of the mA/kV was utilized to reduce the radiation dose to as low as reasonably achievable. COMPARISON: CT of 06/08/2021 CLINICAL HISTORY: Bowel obstruction suspected. FINDINGS: LOWER CHEST: Bibasilar scarring. LIVER: No acute abnormality. GALLBLADDER AND BILE DUCTS: Suspect subtle dependent gallstones. No acute cholecystitis or biliary duct dilatation. SPLEEN: No acute abnormality. PANCREAS: Normal pancreas for age. ADRENAL GLANDS: No acute abnormality. KIDNEYS, URETERS AND BLADDER: Dominant upper pole left renal 9.5 cm  simple cyst. Other bilateral renal lesions are likely cysts and do not warrant imaging follow up. No hydronephrosis. No perinephric or periureteral stranding. Urinary bladder is unremarkable. GI AND BOWEL: Left inguinal hernia contains nonobstructive sigmoid colon. Decompressed terminal ileum. Proximal and mid small bowel loops are moderately dilated and fluid filled including at 4.5 cm on image 25/3. Focal transition identified within the upper pelvis including on the images 46-43 of series 3. No obstructive mass. Just distal to this, small bowel enters an area of ventral abdominal wall laxity. Prior enterotomy felt to be distal to the site of obstruction. No evidence of complicating ischemia. PERITONEUM AND RETROPERITONEUM: No free intraperitoneal air or abdominal pelvic ascites. VASCULATURE: Aortic atherosclerosis. Retroaortic left renal vein. LYMPH NODES: No lymphadenopathy. REPRODUCTIVE ORGANS: Mild prostatomegaly. BONES AND SOFT TISSUES: Convex right lumbar spine curvature. Lumbosacral spondylosis. Beam hardening artifact degrees evaluation of the pelvis secondary to right hip arthroplasty. No acute osseous abnormality. No focal soft tissue abnormality. IMPRESSION: 1. High-grade small bowel obstruction, likely secondary to adhesions. No complicating ischemia 2. Inguinal hernia containing nonobstructive sigmoid. 3. Probable cholelithiasis 4. beam hardening artifact limits evaluation of the pelvis secondary to right hip arthroplasty. Electronically signed by: Rockey Kilts MD 06/05/2024 03:17 PM EDT RP Workstation: [id number]    EKG: Independently reviewed. A Fib RVR rate 157    Assessment/Plan Principal Problem:   SBO (small bowel obstruction) (HCC) Active Problems:   Atrial fibrillation with rapid ventricular response (HCC)   Hyperlipidemia   AKI (acute kidney injury) (HCC)   SBO  - General surgery consulted - NPO, IVF  A Fib RVR - Cardizem  gtt  - On Eliquis  PTA.  Start IV heparin   AKI -  Baseline Cr 0.9  - IVF   Holding home meds including: Eliquis , Lipitor, PO Cardizem     DVT prophylaxis: IV heparin  Code Status: Full code, discussed at time of admission with patient Family Communication: None at bedside  Disposition Plan: Home Consults called: General surgery   Severity of Illness: The appropriate patient status for this patient is INPATIENT. Inpatient status is judged to be reasonable and necessary in order to provide the required intensity of service to ensure the patient's safety. The patient's presenting symptoms, physical exam findings, and initial radiographic and laboratory data in the context of their chronic comorbidities is felt to place them at high risk for further clinical deterioration. Furthermore, it is not anticipated that the patient will be medically stable for discharge from the hospital within 2 midnights of admission.   * I certify that at the point of admission it is my clinical judgment that the patient will require inpatient hospital care spanning beyond 2 midnights from the point of admission due  to high intensity of service, high risk for further deterioration and high frequency of surveillance required.DEWAINE Delon Hoe, DO [facility] 06/05/2024, 4:16 PM   Available via Epic secure chat 7am-7pm After these hours, please refer to coverage provider listed on [email or web address]

## 2024-06-05 NOTE — ED Provider Notes (Signed)
 [place] EMERGENCY DEPARTMENT AT [facility] Provider Note   CSN: [id number] Arrival date & time: 06/05/24  1154     Patient presents with: Constipation   Gary Rice is a 89 y.o. male past medical history significant for A-fib, SBO, ileus, SVT, and pelvic abscess presents today for constipation x 4 to 5 days and no flatus since same.  Patient reports abdominal pain with standing and vomiting.  Patient denies chest pain, shortness of breath, fever, chills, hematemesis, any other complaints at this time.    Constipation Associated symptoms: vomiting        Prior to Admission medications   Medication Sig Start Date End Date Taking? Authorizing Provider  apixaban  (ELIQUIS ) 5 MG TABS tablet Take 1 tablet (5 mg total) by mouth 2 (two) times daily. 06/25/21   Jeffrie Oneil BROCKS, MD  atorvastatin  (LIPITOR) 10 MG tablet Take 10 mg by mouth at bedtime. 08/14/21   [provider]  Cholecalciferol  (VITAMIN D3) 50 MCG (2000 UT) TABS Take 2,000 Units by mouth daily.    [provider]  diltiazem  (CARDIZEM ) 30 MG tablet Take 30 mg by mouth daily as needed (for blood pressure and/or chest pain).    [provider]  fluticasone (FLONASE) 50 MCG/ACT nasal spray as needed for allergies. Patient not taking: Reported on 03/23/2024 10/06/23   [provider]  ipratropium (ATROVENT ) 0.03 % nasal spray Place 2 sprays into both nostrils every 12 (twelve) hours. 01/11/24   Desai, Nikita S, MD  Multiple Vitamin (MULTIVITAMIN WITH MINERALS) TABS tablet Take 1 tablet by mouth daily with breakfast.    [provider]  Multiple Vitamins-Minerals (PRESERVISION AREDS 2) CAPS Take 1 capsule by mouth in the morning and at bedtime.    [provider]  pantoprazole  (PROTONIX ) 40 MG tablet Take 40 mg by mouth as needed (indigestion heartburn or acid reflux).    [provider]  polyethylene glycol (MIRALAX  / GLYCOLAX ) 17 g packet Take 17 g by mouth as  needed for mild constipation or moderate constipation.    [provider]  psyllium (METAMUCIL) 58.6 % packet Take 1 packet by mouth daily as needed (for constipation).    [provider]  VITAMIN E PO Take 1 capsule by mouth daily.     [provider]    Allergies: Patient has no known allergies.    Review of Systems  Gastrointestinal:  Positive for constipation and vomiting.    Updated Vital Signs BP (!) 104/91   Pulse 82   Temp 97.8 F (36.6 C) (Oral)   Resp 20   SpO2 94%   Physical Exam Vitals and nursing note reviewed.  Constitutional:      General: He is not in acute distress.    Appearance: He is well-developed. He is not toxic-appearing or diaphoretic.  HENT:     Head: Normocephalic and atraumatic.     Right Ear: External ear normal.     Left Ear: External ear normal.     Nose: Nose normal.     Mouth/Throat:     Mouth: Mucous membranes are moist.  Eyes:     Extraocular Movements: Extraocular movements intact.     Conjunctiva/sclera: Conjunctivae normal.  Cardiovascular:     Rate and Rhythm: Tachycardia present. Rhythm irregular.     Heart sounds: Normal heart sounds. No murmur heard.    Comments: Patient found to be in A-fib with RVR on exam Pulmonary:     Effort: Pulmonary effort is normal.  No respiratory distress.     Breath sounds: Normal breath sounds.  Abdominal:     General: There is no distension.     Palpations: Abdomen is soft.     Tenderness: There is no abdominal tenderness.     Hernia: A hernia is present.  Musculoskeletal:        General: No swelling.     Cervical back: Neck supple.  Skin:    General: Skin is warm and dry.     Capillary Refill: Capillary refill takes less than 2 seconds.  Neurological:     General: No focal deficit present.     Mental Status: He is alert and oriented to person, place, and time.     Cranial Nerves: No cranial nerve deficit.     Sensory: No sensory deficit.  Psychiatric:         Mood and Affect: Mood normal.     (all labs ordered are listed, but only abnormal results are displayed) Labs Reviewed  COMPREHENSIVE METABOLIC PANEL WITH GFR - Abnormal; Notable for the following components:      Result Value   Sodium 132 (*)    Chloride 87 (*)    Glucose, Bld 134 (*)    BUN 69 (*)    Creatinine, Ser 1.81 (*)    Total Bilirubin 1.4 (*)    GFR, Estimated 35 (*)    Anion gap 20 (*)    All other components within normal limits  CBC WITH DIFFERENTIAL/PLATELET - Abnormal; Notable for the following components:   Monocytes Absolute 1.2 (*)    All other components within normal limits  PROTIME-INR - Abnormal; Notable for the following components:   Prothrombin Time 17.5 (*)    INR 1.4 (*)    All other components within normal limits  LIPASE, BLOOD  URINALYSIS, ROUTINE W REFLEX MICROSCOPIC    EKG: EKG Interpretation Date/Time:  Monday June 05 2024 12:04:51 EDT Ventricular Rate:  157 PR Interval:    QRS Duration:  80 QT Interval:  284 QTC Calculation: 459 R Axis:   75  Text Interpretation: Atrial fibrillation with rapid V-rate Minimal ST depression, inferior leads Confirmed by Cottie Cough [phone]) on 06/05/2024 12:31:55 PM  Radiology: CT ABDOMEN PELVIS W CONTRAST Result Date: 06/05/2024 EXAM: CT ABDOMEN AND PELVIS WITH CONTRAST 06/05/2024 02:41:01 PM TECHNIQUE: CT of the abdomen and pelvis was performed with the administration of intravenous contrast. Multiplanar reformatted images are provided for review. Automated exposure control, iterative reconstruction, and/or weight-based adjustment of the mA/kV was utilized to reduce the radiation dose to as low as reasonably achievable. COMPARISON: CT of 06/08/2021 CLINICAL HISTORY: Bowel obstruction suspected. FINDINGS: LOWER CHEST: Bibasilar scarring. LIVER: No acute abnormality. GALLBLADDER AND BILE DUCTS: Suspect subtle dependent gallstones. No acute cholecystitis or biliary duct dilatation. SPLEEN: No acute  abnormality. PANCREAS: Normal pancreas for age. ADRENAL GLANDS: No acute abnormality. KIDNEYS, URETERS AND BLADDER: Dominant upper pole left renal 9.5 cm simple cyst. Other bilateral renal lesions are likely cysts and do not warrant imaging follow up. No hydronephrosis. No perinephric or periureteral stranding. Urinary bladder is unremarkable. GI AND BOWEL: Left inguinal hernia contains nonobstructive sigmoid colon. Decompressed terminal ileum. Proximal and mid small bowel loops are moderately dilated and fluid filled including at 4.5 cm on image 25/3. Focal transition identified within the upper pelvis including on the images 46-43 of series 3. No obstructive mass. Just distal to this, small bowel enters an area of ventral abdominal wall laxity. Prior enterotomy felt to be distal  to the site of obstruction. No evidence of complicating ischemia. PERITONEUM AND RETROPERITONEUM: No free intraperitoneal air or abdominal pelvic ascites. VASCULATURE: Aortic atherosclerosis. Retroaortic left renal vein. LYMPH NODES: No lymphadenopathy. REPRODUCTIVE ORGANS: Mild prostatomegaly. BONES AND SOFT TISSUES: Convex right lumbar spine curvature. Lumbosacral spondylosis. Beam hardening artifact degrees evaluation of the pelvis secondary to right hip arthroplasty. No acute osseous abnormality. No focal soft tissue abnormality. IMPRESSION: 1. High-grade small bowel obstruction, likely secondary to adhesions. No complicating ischemia 2. Inguinal hernia containing nonobstructive sigmoid. 3. Probable cholelithiasis 4. beam hardening artifact limits evaluation of the pelvis secondary to right hip arthroplasty. Electronically signed by: Rockey Kilts MD 06/05/2024 03:17 PM EDT RP Workstation: [id number]     Procedures   Medications Ordered in the ED  diltiazem  (CARDIZEM ) 125 mg in dextrose  5% 125 mL (1 mg/mL) infusion (has no administration in time range)  sodium chloride  0.9 % bolus 1,000 mL (1,000 mLs Intravenous New Bag/Given  06/05/24 1245)  iohexol  (OMNIPAQUE ) 350 MG/ML injection 50 mL (50 mLs Intravenous Contrast Given 06/05/24 1447)    Clinical Course as of 06/05/24 1550  Mon Jun 05, 2024  1230 89 yo male w/ hx of A Fib on eliquis , bowel obstructions, here with nausea/vomiting x 4 days, does not recall his last bowel movement.  Patient does not note that he is in A-fib with RVR and cannot feel the palpitations.  He reports difficulty with taking his oral medications or keeping any fluids down for the past few days.  On exam the patient is afebrile, A-fib RVR, heart rate 120-130 average.  Blood pressure is low to normal for him.  No tachypnea or hypoxia.  Patient abdominal exam is largely unremarkable but he does have a left inguinal hernia that is soft and reducible.  His history is still consistent with potential ileus versus obstruction.  Will plan for likely CT imaging and will check his electrolytes.  Patient is in A-fib with RVR and we will start with IV fluid resuscitation as a suspect he is clinically dehydrated, prior to proceeding with rate control. [MT]  1346 Elevated BUN, creatinine and anion gap are consistent with dehydration.  GFR remains above 30, reasonable for a contrasted CT scan.  Patient is being prehydrated now with IV fluids [MT]    Clinical Course User Index [MT] Trifan, Donnice PARAS, MD                                 Medical Decision Making Amount and/or Complexity of Data Reviewed Labs: ordered. Radiology: ordered.   This patient presents to the ED for concern of constipation and abdominal pain with vomiting, this involves an extensive number of treatment options, and is a complaint that carries with it a high risk of complications and morbidity.  The differential diagnosis includes SBO, viral GI illness, ileus, diverticulitis, colitis, pancreatitis, appendicitis, choledocholithiasis, A-fib with RVR   Co morbidities / Chronic conditions that complicate the patient evaluation  A-fib, SBO,  ileus   Additional history obtained:  Additional history obtained from EMR External records from outside source obtained and reviewed including Care Everywhere   Lab Tests:  I Ordered, and personally interpreted labs.  The pertinent results include: CBC unremarkable, mild hyponatremia at 132, reduced chloride at 87, elevated bun at 69, elevated creatinine of 1.81 increased from 0.9 approximately 1 year ago, elevated anion gap at 20, elevated pro time and INR   Imaging Studies ordered:  I ordered imaging studies including CT abdomen pelvis with contrast I independently visualized and interpreted imaging which showed high-grade bowel obstruction, likely secondary to adhesions.  No complicating ischemia. I agree with the radiologist interpretation   Cardiac Monitoring: / EKG:  The patient was maintained on a cardiac monitor.  I personally viewed and interpreted the cardiac monitored which showed an underlying rhythm of: A-fib with RVR, minimal ST depression in inferior leads   Problem List / ED Course / Critical interventions / Medication management I ordered medication including IVF and diltiazem  infusion I have reviewed the patients home medicines and have made adjustments as needed   Consultations Obtained:  Consulted general surgery, Vertell Pringle PA-C who is agreeable to consult on the patient while admitted. Consulted hospitalist, Dr. Rojelio who is agreeable to admission for small bowel obstruction and A-fib with RVR   Test / Admission - Considered:  Admit     Final diagnoses:  Small bowel obstruction [facility])  Atrial fibrillation with RVR [facility])    ED Discharge Orders     None          Francis Ileana SAILOR, PA-C 06/05/24 1550    Cottie Donnice PARAS, MD 06/05/24 [phone]

## 2024-06-05 NOTE — Consult Note (Signed)
 Consult Note  Phyllis Whitefield [facility] 01/28/1931  [id number].    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 He reports no flatus during this time also and associated abdominal pain, nausea and vomiting. Pain is improved now. Prior abdominal surgery includes open appendectomy, open left inguinal hernia repair with mesh, exploratory laparotomy with small bowel resection. NKDA. Patient lives with his wife and is independent for ADLs.   ROS: ROS Negative other than HPI  Family History  Problem Relation Age of Onset   Heart disease Father    Heart attack Father    Heart attack Son    Sleep apnea Son     Past Medical History:  Diagnosis Date   Astigmatism    Basal cell carcinoma    Inguinal hernia    bilateral   Macular degeneration, age related, nonexudative    bilateral   SVT (supraventricular tachycardia) (HCC)     Past Surgical History:  Procedure Laterality Date   APPENDECTOMY     age 52   BOWEL RESECTION  05/09/2021   Procedure: SMALL BOWEL RESECTION;  Surgeon: Ebbie Cough, MD;  Location: [facility] OR;  Service: General;;   CATARACT EXTRACTION, BILATERAL     HERNIA REPAIR     RIH   INGUINAL HERNIA REPAIR Right 05/02/2021   Procedure: REPAIR  INCARCERATED INGUINAL HERNIA  WITH MESH;  Surgeon: Paola Dreama SAILOR, MD;  Location: [facility] OR;  Service: General;  Laterality: Right;   JOINT REPLACEMENT Right    RTHA   LAPAROSCOPY N/A 05/09/2021   Procedure: LAPAROSCOPY DIAGNOSTIC;  Surgeon: Ebbie Cough, MD;  Location: [facility] OR;  Service: General;  Laterality: N/A;   LAPAROTOMY  05/09/2021   Procedure: EXPLORATORY LAPAROTOMY;  Surgeon: Ebbie Cough, MD;  Location:  [facility] OR;  Service: General;;   TONSILLECTOMY      Social History:  reports that he quit smoking about 57 years ago. His smoking use included cigarettes. He started smoking about 67 years ago. He has a 10 pack-year smoking history. He has never used smokeless tobacco. He reports that he does not drink alcohol and does not use drugs.  Allergies: No Known Allergies  (Not in a hospital admission)   Blood pressure (!) 104/91, pulse 82, temperature 97.8 F (36.6 C), temperature source Oral, resp. rate 20, SpO2 94%. Physical Exam:  General: pleasant, WD, thin male who is laying in bed in NAD HEENT: head is normocephalic, atraumatic.  Sclera are noninjected.  PERRL.  Ears and nose without any masses or lesions.  Mouth is pink and moist Heart: irregularly irregular - rate 150bpm.  Palpable radial and pedal pulses bilaterally Lungs: CTAB, no wheezes, rhonchi, or rales noted.  Respiratory effort nonlabored Abd: soft, NT, mild distention - soft ?ventral hernia that temporarily reduces. +BS MS: all 4 extremities are symmetrical with no cyanosis, clubbing, or edema. Skin: warm and dry with no masses, lesions, or rashes Neuro: Cranial nerves 2-12 grossly intact, sensation is normal throughout Psych: A&Ox3 with an appropriate affect.   Results for orders placed or performed during the hospital encounter of 06/05/24 (from the past 48 hours)  Comprehensive metabolic panel     Status: Abnormal   Collection  Time: 06/05/24 12:41 PM  Result Value Ref Range   Sodium 132 (L) 135 - 145 mmol/L   Potassium 3.9 3.5 - 5.1 mmol/L    Comment: HEMOLYSIS AT THIS LEVEL MAY AFFECT RESULT   Chloride 87 (L) 98 - 111 mmol/L   CO2 25 22 - 32 mmol/L   Glucose, Bld 134 (H) 70 - 99 mg/dL    Comment: Glucose reference range applies only to samples taken after fasting for at least 8 hours.   BUN 69 (H) 8 - 23 mg/dL   Creatinine, Ser 8.18 (H) 0.61 - 1.24 mg/dL   Calcium  10.0 8.9 - 10.3 mg/dL   Total Protein 7.9 6.5 - 8.1  g/dL   Albumin  3.9 3.5 - 5.0 g/dL   AST 37 15 - 41 U/L    Comment: HEMOLYSIS AT THIS LEVEL MAY AFFECT RESULT   ALT 22 0 - 44 U/L    Comment: HEMOLYSIS AT THIS LEVEL MAY AFFECT RESULT   Alkaline Phosphatase 50 38 - 126 U/L   Total Bilirubin 1.4 (H) 0.0 - 1.2 mg/dL    Comment: HEMOLYSIS AT THIS LEVEL MAY AFFECT RESULT   GFR, Estimated 35 (L) >60 mL/min    Comment: (NOTE) Calculated using the CKD-EPI Creatinine Equation (2021)    Anion gap 20 (H) 5 - 15    Comment: Performed at [facility], [address] [place], [place] [address]  CBC with Differential     Status: Abnormal   Collection Time: 06/05/24 12:41 PM  Result Value Ref Range   WBC 8.7 4.0 - 10.5 K/uL   RBC 5.03 4.22 - 5.81 MIL/uL   Hemoglobin 16.0 13.0 - 17.0 g/dL   HCT 54.1 60.9 - 47.9 %   MCV 91.1 80.0 - 100.0 fL   MCH 31.8 26.0 - 34.0 pg   MCHC 34.9 30.0 - 36.0 g/dL   RDW 86.7 88.4 - 84.4 %   Platelets 238 150 - 400 K/uL   nRBC 0.0 0.0 - 0.2 %   Neutrophils Relative % 69 %   Neutro Abs 6.1 1.7 - 7.7 K/uL   Lymphocytes Relative 17 %   Lymphs Abs 1.5 0.7 - 4.0 K/uL   Monocytes Relative 14 %   Monocytes Absolute 1.2 (H) 0.1 - 1.0 K/uL   Eosinophils Relative 0 %   Eosinophils Absolute 0.0 0.0 - 0.5 K/uL   Basophils Relative 0 %   Basophils Absolute 0.0 0.0 - 0.1 K/uL   Immature Granulocytes 0 %   Abs Immature Granulocytes 0.02 0.00 - 0.07 K/uL    Comment: Performed at [facility], [address] [place], [place] [address]  Protime-INR     Status: Abnormal   Collection Time: 06/05/24 12:41 PM  Result Value Ref Range   Prothrombin Time 17.5 (H) 11.4 - 15.2 seconds   INR 1.4 (H) 0.8 - 1.2    Comment: (NOTE) INR goal varies based on device and disease states. Performed at [facility], [address], [place], [place] [address]    CT ABDOMEN PELVIS W CONTRAST Result Date: 06/05/2024 EXAM: CT ABDOMEN AND PELVIS WITH CONTRAST 06/05/2024 02:41:01 PM TECHNIQUE: CT of the abdomen and pelvis was  performed with the administration of intravenous contrast. Multiplanar reformatted images are provided for review. Automated exposure control, iterative reconstruction, and/or weight-based adjustment of the mA/kV was utilized to reduce the radiation dose to as low as reasonably achievable. COMPARISON: CT of 06/08/2021 CLINICAL HISTORY: Bowel obstruction suspected. FINDINGS: LOWER CHEST:  Bibasilar scarring. LIVER: No acute abnormality. GALLBLADDER AND BILE DUCTS: Suspect subtle dependent gallstones. No acute cholecystitis or biliary duct dilatation. SPLEEN: No acute abnormality. PANCREAS: Normal pancreas for age. ADRENAL GLANDS: No acute abnormality. KIDNEYS, URETERS AND BLADDER: Dominant upper pole left renal 9.5 cm simple cyst. Other bilateral renal lesions are likely cysts and do not warrant imaging follow up. No hydronephrosis. No perinephric or periureteral stranding. Urinary bladder is unremarkable. GI AND BOWEL: Left inguinal hernia contains nonobstructive sigmoid colon. Decompressed terminal ileum. Proximal and mid small bowel loops are moderately dilated and fluid filled including at 4.5 cm on image 25/3. Focal transition identified within the upper pelvis including on the images 46-43 of series 3. No obstructive mass. Just distal to this, small bowel enters an area of ventral abdominal wall laxity. Prior enterotomy felt to be distal to the site of obstruction. No evidence of complicating ischemia. PERITONEUM AND RETROPERITONEUM: No free intraperitoneal air or abdominal pelvic ascites. VASCULATURE: Aortic atherosclerosis. Retroaortic left renal vein. LYMPH NODES: No lymphadenopathy. REPRODUCTIVE ORGANS: Mild prostatomegaly. BONES AND SOFT TISSUES: Convex right lumbar spine curvature. Lumbosacral spondylosis. Beam hardening artifact degrees evaluation of the pelvis secondary to right hip arthroplasty. No acute osseous abnormality. No focal soft tissue abnormality. IMPRESSION: 1. High-grade small bowel  obstruction, likely secondary to adhesions. No complicating ischemia 2. Inguinal hernia containing nonobstructive sigmoid. 3. Probable cholelithiasis 4. beam hardening artifact limits evaluation of the pelvis secondary to right hip arthroplasty. Electronically signed by: Rockey Kilts MD 06/05/2024 03:17 PM EDT RP Workstation: [id number]      Assessment/Plan SBO - prior abdominal surgery includes open appendectomy, open inguinal hernia repair with mesh, exploratory laparotomy with small bowel resection  - CT today with high-grade SBO, inguinal hernia containing nonobstructive sigmoid colon, probable cholelithiasis, right hip arthroplasty - no leukocytosis, afebrile, tachycardia - recommend NGT placement and decompression, likely initiate SBO protocol tomorrow  - would not recommend emergent surgical intervention at this time but general surgery will follow. 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  This care required high  level of medical decision making.   Almarie Pringle, [facility] 06/05/2024, 3:36 PM Please see Amion for pager number during day hours 7:00am-4:30pm

## 2024-06-05 NOTE — ED Triage Notes (Signed)
 BIB [facility] from home for constipaton no BM since 4-5 days ago. Vomiting , abd pain. Unable to keep anything down. 110/70 87 hr  18 RR  167 cbg

## 2024-06-06 DIAGNOSIS — K56609 Unspecified intestinal obstruction, unspecified as to partial versus complete obstruction: Secondary | ICD-10-CM | POA: Diagnosis not present

## 2024-06-06 LAB — CBC
HCT: 43.3 % (ref 39.0–52.0)
Hemoglobin: 14.8 g/dL (ref 13.0–17.0)
MCH: 31.8 pg (ref 26.0–34.0)
MCHC: 34.2 g/dL (ref 30.0–36.0)
MCV: 92.9 fL (ref 80.0–100.0)
Platelets: 168 K/uL (ref 150–400)
RBC: 4.66 MIL/uL (ref 4.22–5.81)
RDW: 13.2 % (ref 11.5–15.5)
WBC: 7.3 K/uL (ref 4.0–10.5)
nRBC: 0 % (ref 0.0–0.2)

## 2024-06-06 LAB — APTT
aPTT: 73 s — ABNORMAL HIGH (ref 24–36)
aPTT: 59 s — ABNORMAL HIGH (ref 24–36)

## 2024-06-06 LAB — MRSA NEXT GEN BY PCR, NASAL: MRSA by PCR Next Gen: NOT DETECTED

## 2024-06-06 LAB — BASIC METABOLIC PANEL WITH GFR
Anion gap: 14 (ref 5–15)
BUN: 48 mg/dL — ABNORMAL HIGH (ref 8–23)
CO2: 30 mmol/L (ref 22–32)
Calcium: 9.1 mg/dL (ref 8.9–10.3)
Chloride: 94 mmol/L — ABNORMAL LOW (ref 98–111)
Creatinine, Ser: 1.07 mg/dL (ref 0.61–1.24)
GFR, Estimated: >60 mL/min (ref >60)
Glucose, Bld: 104 mg/dL — ABNORMAL HIGH (ref 70–99)
Potassium: 3.4 mmol/L — ABNORMAL LOW (ref 3.5–5.1)
Sodium: 138 mmol/L (ref 135–145)

## 2024-06-06 LAB — HEPARIN LEVEL (UNFRACTIONATED): Heparin Unfractionated: >1.1 [IU]/mL — ABNORMAL HIGH (ref 0.30–0.70)

## 2024-06-06 LAB — MAGNESIUM: Magnesium: 2.1 mg/dL (ref 1.7–2.4)

## 2024-06-06 MED ORDER — PANTOPRAZOLE SODIUM 40 MG IV SOLR
40.0000 mg | Freq: Every day | INTRAVENOUS | Status: DC
  Administered 2024-06-06 – 2024-06-10 (×5): 40 mg via INTRAVENOUS
  Filled 2024-06-06 (×5): qty 10

## 2024-06-06 MED ORDER — DIATRIZOATE MEGLUMINE & SODIUM 66-10 % PO SOLN
90.0000 mL | Freq: Once | ORAL | Status: AC
  Administered 2024-06-06: 90 mL via NASOGASTRIC
  Filled 2024-06-06: qty 90

## 2024-06-06 MED ORDER — POTASSIUM CHLORIDE 10 MEQ/100ML IV SOLN
10.0000 meq | INTRAVENOUS | Status: AC
Start: 2024-06-06 — End: 2024-06-06
  Administered 2024-06-06 (×5): 10 meq via INTRAVENOUS
  Filled 2024-06-06 (×5): qty 100

## 2024-06-06 MED ORDER — IPRATROPIUM BROMIDE 0.06 % NA SOLN
2.0000 | Freq: Two times a day (BID) | NASAL | Status: DC
  Administered 2024-06-06 – 2024-06-24 (×30): 2 via NASAL
  Filled 2024-06-06: qty 15

## 2024-06-06 MED ORDER — INFLUENZA VAC SPLIT HIGH-DOSE 0.5 ML IM SUSY
0.5000 mL | PREFILLED_SYRINGE | INTRAMUSCULAR | Status: DC
Start: 2024-06-07 — End: 2024-06-24
  Filled 2024-06-06: qty 0.5

## 2024-06-06 NOTE — Evaluation (Signed)
 Physical Therapy Evaluation Patient Details Name: Gary Rice MRN: [id number] DOB: 07/05/1931 Today's Date: 06/06/2024  History of Present Illness  Pt is a 89 y.o. M who presents 06/05/2024 with SBO. Significant PMH:  A-fib on Cardizem  and Eliquis , history of SVT, history of inguinal hernia, history of bowel resection in 2022, inguinal hernia repair 2022.  Clinical Impression  PTA, pt lives with his spouse and is independent; uses cane for community ambulation. Pt reports he typically manages meals for himself and spouse and states he asked a neighbor to assist while he is hospitalized. Pt denies pain at rest, but reports pulling, in upper abdomen during mobility. Pt ambulating 200 ft with single UE support on IV pole with shuffling gait pattern and slow speed. HR with irregular rate, up to 140 briefly. Pt would benefit from follow up HHPT services to address strengthening, balance, gait. Pt agreeable. Will continue to follow acutely.      If plan is discharge home, recommend the following: Assistance with cooking/housework;Assist for transportation;Help with stairs or ramp for entrance   Can travel by private vehicle        Equipment Recommendations None recommended by PT  Recommendations for Other Services       Functional Status Assessment Patient has had a recent decline in their functional status and demonstrates the ability to make significant improvements in function in a reasonable and predictable amount of time.     Precautions / Restrictions Precautions Precautions: Fall;Other (comment) Recall of Precautions/Restrictions: Intact Precaution/Restrictions Comments: watch HR Restrictions Weight Bearing Restrictions Per Provider Order: No      Mobility  Bed Mobility Overal bed mobility: Modified Independent                  Transfers Overall transfer level: Needs assistance Equipment used: None Transfers: Sit to/from Stand Sit to Stand: Supervision                 Ambulation/Gait Ambulation/Gait assistance: Supervision, Contact guard assist Gait Distance (Feet): 200 Feet Assistive device: None Gait Pattern/deviations: Step-through pattern, Decreased stride length, Shuffle Gait velocity: decreased Gait velocity interpretation: <1.8 ft/sec, indicate of risk for recurrent falls   General Gait Details: Decreased bilateral foot clearance, slow speed, rounded shoulders. Use of IV pole for external support  Stairs            Wheelchair Mobility     Tilt Bed    Modified Rankin (Stroke Patients Only)       Balance Overall balance assessment: Mild deficits observed, not formally tested                                           Pertinent Vitals/Pain Pain Assessment Pain Assessment: Faces Faces Pain Scale: Hurts a little bit Pain Location: pulling across upper abdomen Pain Descriptors / Indicators:  (pulling) Pain Intervention(s): Monitored during session    Home Living Family/patient expects to be discharged to:: Private residence Living Arrangements: Spouse/significant other Available Help at Discharge: Family Type of Home: House Home Access: Stairs to enter Entrance Stairs-Rails: Right Entrance Stairs-Number of Steps: 5 Alternate Level Stairs-Number of Steps: 17 Home Layout: Two level Home Equipment: Cane - single point      Prior Function Prior Level of Function : Independent/Modified Independent             Mobility Comments: no AD for household, cane for community ambulation  Extremity/Trunk Assessment   Upper Extremity Assessment Upper Extremity Assessment: Overall WFL for tasks assessed    Lower Extremity Assessment Lower Extremity Assessment: Generalized weakness    Cervical / Trunk Assessment Cervical / Trunk Assessment: Kyphotic  Communication   Communication Communication: No apparent difficulties    Cognition Arousal: Alert Behavior During Therapy: WFL  for tasks assessed/performed   PT - Cognitive impairments: No apparent impairments                         Following commands: Intact       Cueing Cueing Techniques: Verbal cues     General Comments      Exercises     Assessment/Plan    PT Assessment Patient needs continued PT services  PT Problem List Decreased strength;Decreased activity tolerance;Decreased balance;Decreased mobility       PT Treatment Interventions DME instruction;Stair training;Functional mobility training;Gait training;Therapeutic activities;Therapeutic exercise;Balance training;Patient/family education    PT Goals (Current goals can be found in the Care Plan section)  Acute Rehab PT Goals Patient Stated Goal: get better PT Goal Formulation: With patient Time For Goal Achievement: 06/20/24 Potential to Achieve Goals: Good    Frequency Min 2X/week     Co-evaluation               AM-PAC PT 6 Clicks Mobility  Outcome Measure Help needed turning from your back to your side while in a flat bed without using bedrails?: None Help needed moving from lying on your back to sitting on the side of a flat bed without using bedrails?: None Help needed moving to and from a bed to a chair (including a wheelchair)?: A Little Help needed standing up from a chair using your arms (e.g., wheelchair or bedside chair)?: A Little Help needed to walk in hospital room?: A Little Help needed climbing 3-5 steps with a railing? : A Little 6 Click Score: 20    End of Session   Activity Tolerance: Patient tolerated treatment well Patient left: in chair;with call bell/phone within reach;with chair alarm set   PT Visit Diagnosis: Unsteadiness on feet (R26.81);Other abnormalities of gait and mobility (R26.89)    Time: 1530-1600 PT Time Calculation (min) (ACUTE ONLY): 30 min   Charges:   PT Evaluation $PT Eval Low Complexity: 1 Low PT Treatments $Therapeutic Activity: 8-22 mins PT General  Charges $$ ACUTE PT VISIT: 1 Visit         Aleck Daring, PT, DPT Acute Rehabilitation Services Office [phone]   Aleck ONEIDA Daring 06/06/2024, 4:19 PM

## 2024-06-06 NOTE — Progress Notes (Signed)
 PHARMACY - ANTICOAGULATION CONSULT NOTE  Pharmacy Consult for heparin  Indication: atrial fibrillation  No Known Allergies  Patient Measurements: Weight: 61.2 kg (135 lb)  Vital Signs: Temp: 98.3 F (36.8 C) (09/09 1228) Temp Source: Oral (09/09 1228) BP: 103/66 (09/09 1228)  Labs: Recent Labs    06/05/24 1241 06/06/24 0530 06/06/24 1335  HGB 16.0 14.8  --   HCT 45.8 43.3  --   PLT 238 168  --   APTT  --  73* 59*  LABPROT 17.5*  --   --   INR 1.4*  --   --   HEPARINUNFRC  --  >1.10*  --   CREATININE 1.81* 1.07  --     Estimated Creatinine Clearance: 38.9 mL/min (by C-G formula based on SCr of 1.07 mg/dL).   Medical History: Past Medical History:  Diagnosis Date   Astigmatism    Basal cell carcinoma    Inguinal hernia    bilateral   Macular degeneration, age related, nonexudative    bilateral   SVT (supraventricular tachycardia) (HCC)      Assessment: 89 yo M presenting with abdominal pain, constipation, in Afib RVR, hx of afib on Eliquis  PTA with last dose 9/8 AM, on hold for SBO eval.    AM: aPTT therapeutic on heparin  at 950 units/hr.  Heparin  level elevated as expected.  Patient remains NPO with NGT placed to suction from SBO.  PM: aPTT subtherapeutic at 59 on 950 units/hr. No issues with the heparin  infusion or bleeding reported per RN.  Goal of Therapy:  Heparin  level 0.3-0.7 units/ml aPTT 66-102 seconds Monitor platelets by anticoagulation protocol: Yes   Plan:  Increase heparin  gtt to 1050 units/hr F/u 8 hour aPTT  Daily aPTT, heparin  level, and CBC F/u surg recs and ability to transition back to Eliquis    Rocky Slade, PharmD, BCPS Clinical Pharmacist 06/06/2024 4:15 PM  Please check AMION for all [facility] phone numbers After 10:00 PM, call Main Pharmacy [phone]

## 2024-06-06 NOTE — Progress Notes (Signed)
 PROGRESS NOTE    [facility]  FMW:[id number] DOB: 02/26/1931 DOA: 06/05/2024 PCP: Kip Righter, MD   Chief Complaint  Patient presents with   Constipation    Brief Narrative:   Gary Rice is a 89 y.o. male with medical history significant of A-fib on Cardizem  and Eliquis , history of SVT, history of inguinal hernia, history of bowel resection in 2022, inguinal hernia repair 2022.  He states that he has been constipated for 4 to 5 days, as well no flatus, he reports intermittent abdominal pain, nausea and vomiting, workup significant for SBO, admitted for further workup  Assessment & Plan:   Principal Problem:   SBO (small bowel obstruction) (HCC) Active Problems:   Atrial fibrillation with rapid ventricular response (HCC)   Hyperlipidemia   AKI (acute kidney injury) (HCC)   SBO -Management per general surgery. - He remains n.p.o. -Remains on NGT suction. - Continue with IV fluids. - Continue with as needed pain and nausea medicine - Management per general surgery  A-fib with RVR - On Eliquis  at home, currently on heparin  drip - N.p.o., so continue with Cardizem  drip for heart rate control  AKI - Creatinine up to 1.8 on admission, improved with IV fluids  Hyperkalemia - Replaced  Deconditioning - Will consult PT/OT    DVT prophylaxis: Heparin  GTT Code Status: Full code Family Communication: None at bedside Disposition:   Status is: Inpatient    Consultants:  General surgery  Subjective:  Patient reports he is hungry, no bowel movement, did not pass gas overnight  Objective: Vitals:   06/06/24 0000 06/06/24 0400 06/06/24 0700 06/06/24 1228  BP: 113/71 105/69 111/67 103/66  Pulse: 94 89    Resp: 18 15    Temp: 98.3 F (36.8 C) 98.7 F (37.1 C) 98.4 F (36.9 C) 98.3 F (36.8 C)  TempSrc: Oral Oral Oral Oral  SpO2: 95% 95%    Weight:        Intake/Output Summary (Last 24 hours) at 06/06/2024 1253 Last data filed at 06/06/2024  0700 Gross per 24 hour  Intake 0 ml  Output 2750 ml  Net -2750 ml   Filed Weights   06/05/24 1705  Weight: 61.2 kg    Examination:  Awake Alert, Oriented X 3, frail, deconditioned, NG tube present  Good air movement bilaterally, CTAB Regular Bowel sounds present, no rebound, No Cyanosis, Clubbing or edema   Data Reviewed: I have personally reviewed following labs and imaging studies  CBC: Recent Labs  Lab 06/05/24 1241 06/06/24 0530  WBC 8.7 7.3  NEUTROABS 6.1  --   HGB 16.0 14.8  HCT 45.8 43.3  MCV 91.1 92.9  PLT 238 168    Basic Metabolic Panel: Recent Labs  Lab 06/05/24 1241 06/06/24 0529 06/06/24 0530  NA 132*  --  138  K 3.9  --  3.4*  CL 87*  --  94*  CO2 25  --  30  GLUCOSE 134*  --  104*  BUN 69*  --  48*  CREATININE 1.81*  --  1.07  CALCIUM  10.0  --  9.1  MG  --  2.1  --     GFR: Estimated Creatinine Clearance: 38.9 mL/min (by C-G formula based on SCr of 1.07 mg/dL).  Liver Function Tests: Recent Labs  Lab 06/05/24 1241  AST 37  ALT 22  ALKPHOS 50  BILITOT 1.4*  PROT 7.9  ALBUMIN  3.9    CBG: No results for input(s): GLUCAP in the last 168  hours.   Recent Results (from the past 240 hours)  MRSA Next Gen by PCR, Nasal     Status: None   Collection Time: 06/06/24 12:45 AM   Specimen: Nasal Mucosa; Nasal Swab  Result Value Ref Range Status   MRSA by PCR Next Gen NOT DETECTED NOT DETECTED Final    Comment: (NOTE) The GeneXpert MRSA Assay (FDA approved for NASAL specimens only), is one component of a comprehensive MRSA colonization surveillance program. It is not intended to diagnose MRSA infection nor to guide or monitor treatment for MRSA infections. Test performance is not FDA approved in patients less than 29 years old. Performed at [facility], [address], [place], [place] [address]          Radiology Studies: DG Abd Portable 1 View Result Date: 06/05/2024 CLINICAL DATA:  NG tube placement EXAM: PORTABLE  ABDOMEN - 1 VIEW COMPARISON:  05/12/2021.  CT today. FINDINGS: NG tube tip is in the fundus of the stomach with the side port near the GE junction. Mildly dilated abdominal small bowel loops as seen on CT. IMPRESSION: NG tube tip in the fundus of the stomach. Electronically Signed   By: Franky Crease M.D.   On: 06/05/2024 17:20   CT ABDOMEN PELVIS W CONTRAST Result Date: 06/05/2024 EXAM: CT ABDOMEN AND PELVIS WITH CONTRAST 06/05/2024 02:41:01 PM TECHNIQUE: CT of the abdomen and pelvis was performed with the administration of intravenous contrast. Multiplanar reformatted images are provided for review. Automated exposure control, iterative reconstruction, and/or weight-based adjustment of the mA/kV was utilized to reduce the radiation dose to as low as reasonably achievable. COMPARISON: CT of 06/08/2021 CLINICAL HISTORY: Bowel obstruction suspected. FINDINGS: LOWER CHEST: Bibasilar scarring. LIVER: No acute abnormality. GALLBLADDER AND BILE DUCTS: Suspect subtle dependent gallstones. No acute cholecystitis or biliary duct dilatation. SPLEEN: No acute abnormality. PANCREAS: Normal pancreas for age. ADRENAL GLANDS: No acute abnormality. KIDNEYS, URETERS AND BLADDER: Dominant upper pole left renal 9.5 cm simple cyst. Other bilateral renal lesions are likely cysts and do not warrant imaging follow up. No hydronephrosis. No perinephric or periureteral stranding. Urinary bladder is unremarkable. GI AND BOWEL: Left inguinal hernia contains nonobstructive sigmoid colon. Decompressed terminal ileum. Proximal and mid small bowel loops are moderately dilated and fluid filled including at 4.5 cm on image 25/3. Focal transition identified within the upper pelvis including on the images 46-43 of series 3. No obstructive mass. Just distal to this, small bowel enters an area of ventral abdominal wall laxity. Prior enterotomy felt to be distal to the site of obstruction. No evidence of complicating ischemia. PERITONEUM AND  RETROPERITONEUM: No free intraperitoneal air or abdominal pelvic ascites. VASCULATURE: Aortic atherosclerosis. Retroaortic left renal vein. LYMPH NODES: No lymphadenopathy. REPRODUCTIVE ORGANS: Mild prostatomegaly. BONES AND SOFT TISSUES: Convex right lumbar spine curvature. Lumbosacral spondylosis. Beam hardening artifact degrees evaluation of the pelvis secondary to right hip arthroplasty. No acute osseous abnormality. No focal soft tissue abnormality. IMPRESSION: 1. High-grade small bowel obstruction, likely secondary to adhesions. No complicating ischemia 2. Inguinal hernia containing nonobstructive sigmoid. 3. Probable cholelithiasis 4. beam hardening artifact limits evaluation of the pelvis secondary to right hip arthroplasty. Electronically signed by: Rockey Kilts MD 06/05/2024 03:17 PM EDT RP Workstation: [id number]        Scheduled Meds:  diatrizoate  meglumine -sodium  90 mL Per NG tube Once   [START ON 06/07/2024] Influenza vac split trivalent PF  0.5 mL Intramuscular Tomorrow-1000   ipratropium  2 spray Each Nare BID   sodium  chloride flush  3 mL Intravenous Q12H   Continuous Infusions:  sodium chloride  100 mL/hr at 06/06/24 0139   diltiazem  (CARDIZEM ) infusion 12.5 mg/hr (06/06/24 0159)   heparin  950 Units/hr (06/05/24 2101)   potassium chloride  10 mEq (06/06/24 1151)     LOS: 1 day      Brayton Lye, MD [facility]   To contact the attending provider between 7A-7P or the covering provider during after hours 7P-7A, please log into the web site [email or web address] and access using universal [place] password for that web site. If you do not have the password, please call the hospital operator.  06/06/2024, 12:53 PM

## 2024-06-06 NOTE — Progress Notes (Signed)
 [facility] Progress Note     Subjective: CC:  Denies abdominal pain at rest, reports some discomfort with standing up. No vomiting. Reports phlegm and productive cough. NG >1900 mL/24h. He denies flatus or BM.  At baseline he mobilizes in his home without an assistive device. He walks when a cane when he goes out. He stopped mowing his lawn about one year ago and is less active. He says he can climb 17 stairs in his home without stopping due to chest pain or dyspnea.  Objective: Vital signs in last 24 hours: Temp:  [97.8 F (36.6 C)-98.7 F (37.1 C)] 98.3 F (36.8 C) (09/09 1228) Pulse Rate:  [89-155] 89 (09/09 0400) Resp:  [15-21] 15 (09/09 0400) BP: (92-126)/(66-85) 103/66 (09/09 1228) SpO2:  [90 %-95 %] 95 % (09/09 0400) Weight:  [61.2 kg] 61.2 kg (09/08 1705)    Intake/Output from previous day: 09/08 0701 - 09/09 0700 In: 0  Out: 2750 [Urine:800; Emesis/NG output:1950] Intake/Output this shift: No intake/output data recorded.  PE: Gen:  Alert, NAD, pleasant Card:  HR 80's Pulm:  Normal effort Abd: Soft, interval decrease in distention, abdominal wall defect is softer, LIH is partially reducible with palpable hard stool within the colon that is in the hernia sac.   NG - thick brown effluent  Skin: warm and dry, no rashes  Psych: A&Ox3   Lab Results:  Recent Labs    06/05/24 1241 06/06/24 0530  WBC 8.7 7.3  HGB 16.0 14.8  HCT 45.8 43.3  PLT 238 168   BMET Recent Labs    06/05/24 1241 06/06/24 0530  NA 132* 138  K 3.9 3.4*  CL 87* 94*  CO2 25 30  GLUCOSE 134* 104*  BUN 69* 48*  CREATININE 1.81* 1.07  CALCIUM  10.0 9.1   PT/INR Recent Labs    06/05/24 1241  LABPROT 17.5*  INR 1.4*   CMP     Component Value Date/Time   NA 138 06/06/2024 0530   NA 143 04/23/2022 1454   K 3.4 (L) 06/06/2024 0530   CL 94 (L) 06/06/2024 0530   CO2 30 06/06/2024 0530   GLUCOSE 104 (H) 06/06/2024 0530   BUN 48 (H) 06/06/2024 0530   BUN 23 04/23/2022  1454   CREATININE 1.07 06/06/2024 0530   CALCIUM  9.1 06/06/2024 0530   PROT 7.9 06/05/2024 1241   ALBUMIN  3.9 06/05/2024 1241   AST 37 06/05/2024 1241   ALT 22 06/05/2024 1241   ALKPHOS 50 06/05/2024 1241   BILITOT 1.4 (H) 06/05/2024 1241   GFRNONAA >60 06/06/2024 0530   GFRAA  08/14/2007 0312    >60        The eGFR has been calculated using the MDRD equation. This calculation has not been validated in all clinical   Lipase     Component Value Date/Time   LIPASE 18 06/05/2024 1241       Studies/Results: DG Abd Portable 1 View Result Date: 06/05/2024 CLINICAL DATA:  NG tube placement EXAM: PORTABLE ABDOMEN - 1 VIEW COMPARISON:  05/12/2021.  CT today. FINDINGS: NG tube tip is in the fundus of the stomach with the side port near the GE junction. Mildly dilated abdominal small bowel loops as seen on CT. IMPRESSION: NG tube tip in the fundus of the stomach. Electronically Signed   By: Franky Crease M.D.   On: 06/05/2024 17:20   CT ABDOMEN PELVIS W CONTRAST Result Date: 06/05/2024 EXAM: CT ABDOMEN AND PELVIS WITH CONTRAST 06/05/2024 02:41:01 PM  TECHNIQUE: CT of the abdomen and pelvis was performed with the administration of intravenous contrast. Multiplanar reformatted images are provided for review. Automated exposure control, iterative reconstruction, and/or weight-based adjustment of the mA/kV was utilized to reduce the radiation dose to as low as reasonably achievable. COMPARISON: CT of 06/08/2021 CLINICAL HISTORY: Bowel obstruction suspected. FINDINGS: LOWER CHEST: Bibasilar scarring. LIVER: No acute abnormality. GALLBLADDER AND BILE DUCTS: Suspect subtle dependent gallstones. No acute cholecystitis or biliary duct dilatation. SPLEEN: No acute abnormality. PANCREAS: Normal pancreas for age. ADRENAL GLANDS: No acute abnormality. KIDNEYS, URETERS AND BLADDER: Dominant upper pole left renal 9.5 cm simple cyst. Other bilateral renal lesions are likely cysts and do not warrant imaging follow  up. No hydronephrosis. No perinephric or periureteral stranding. Urinary bladder is unremarkable. GI AND BOWEL: Left inguinal hernia contains nonobstructive sigmoid colon. Decompressed terminal ileum. Proximal and mid small bowel loops are moderately dilated and fluid filled including at 4.5 cm on image 25/3. Focal transition identified within the upper pelvis including on the images 46-43 of series 3. No obstructive mass. Just distal to this, small bowel enters an area of ventral abdominal wall laxity. Prior enterotomy felt to be distal to the site of obstruction. No evidence of complicating ischemia. PERITONEUM AND RETROPERITONEUM: No free intraperitoneal air or abdominal pelvic ascites. VASCULATURE: Aortic atherosclerosis. Retroaortic left renal vein. LYMPH NODES: No lymphadenopathy. REPRODUCTIVE ORGANS: Mild prostatomegaly. BONES AND SOFT TISSUES: Convex right lumbar spine curvature. Lumbosacral spondylosis. Beam hardening artifact degrees evaluation of the pelvis secondary to right hip arthroplasty. No acute osseous abnormality. No focal soft tissue abnormality. IMPRESSION: 1. High-grade small bowel obstruction, likely secondary to adhesions. No complicating ischemia 2. Inguinal hernia containing nonobstructive sigmoid. 3. Probable cholelithiasis 4. beam hardening artifact limits evaluation of the pelvis secondary to right hip arthroplasty. Electronically signed by: Rockey Kilts MD 06/05/2024 03:17 PM EDT RP Workstation: [id number]    Anti-infectives: Anti-infectives (From admission, onward)    None        Assessment/Plan  SBO - prior abdominal surgery includes open appendectomy, open inguinal hernia repair with mesh, exploratory laparotomy with small bowel resection  - CT 9/8 with high-grade SBO, inguinal hernia containing nonobstructive sigmoid colon, probable cholelithiasis, right hip arthroplasty - no emergent surgical needs, continue NG to LIWS, start SBO protocol today. If he fails to  improve he may require laparotomy, LOA, hernia repair, which would be of at least moderate risk in this elderly male with afib and prior history of multiple abdominal surgeries.    FEN: NPO, IVF per admitting, NGT to LIWS  VTE: hold eliquis , ok to have heparin  gtt if needed from surgical standpoint ID: no abx indicated from general surgery standpoint   - per TRH -  Hx of SVT Atrial fibrillation on Eliquis  Trivial MR Ascending aortic aneurysm  Macular degeneration  Hx of basal cell carcinoma  Chronic hoarseness     LOS: 1 day   I reviewed nursing notes, hospitalist notes, last 24 h vitals and pain scores, last 48 h intake and output, last 24 h labs and trends, and last 24 h imaging results.  This care required moderate level of medical decision making.   Almarie Pringle, PA-C [facility] Please see Amion for pager number during day hours 7:00am-4:30pm

## 2024-06-06 NOTE — Progress Notes (Signed)
 PHARMACY - ANTICOAGULATION CONSULT NOTE  Pharmacy Consult for heparin  Indication: atrial fibrillation  No Known Allergies  Patient Measurements: Weight: 61.2 kg (135 lb)  Vital Signs: Temp: 98.4 F (36.9 C) (09/09 0700) Temp Source: Oral (09/09 0700) BP: 111/67 (09/09 0700) Pulse Rate: 89 (09/09 0400)  Labs: Recent Labs    06/05/24 1241 06/06/24 0530  HGB 16.0 14.8  HCT 45.8 43.3  PLT 238 168  APTT  --  73*  LABPROT 17.5*  --   INR 1.4*  --   HEPARINUNFRC  --  >1.10*  CREATININE 1.81* 1.07    Estimated Creatinine Clearance: 38.9 mL/min (by C-G formula based on SCr of 1.07 mg/dL).   Medical History: Past Medical History:  Diagnosis Date   Astigmatism    Basal cell carcinoma    Inguinal hernia    bilateral   Macular degeneration, age related, nonexudative    bilateral   SVT (supraventricular tachycardia) (HCC)      Assessment: 89 yo M presenting with abdominal pain, constipation, in Afib RVR, hx of afib on Eliquis  PTA with last dose 9/8 AM, on hold for SBO eval.    aPTT therapeutic on heparin  at 950 units/hr.  Heparin  level elevated as expected.  Patient remains NPO with NGT placed to suction from SBO.  Goal of Therapy:  Heparin  level 0.3-0.7 units/ml aPTT 66-102 seconds Monitor platelets by anticoagulation protocol: Yes   Plan:  Continue heparin  gtt at 950 units/hr F/u 8 hour aPTT for confirmation Daily aPTT, heparin  level, and CBC F/u surg recs and ability to transition back to Eliquis    [employer], Pharm.D., BCPS Clinical Pharmacist Clinical phone for 06/06/2024 from 7:30-3:00 is [phone].  **Pharmacist phone directory can be found on [email or web address] listed under [facility].  06/06/2024 8:31 AM

## 2024-06-07 ENCOUNTER — Inpatient Hospital Stay (HOSPITAL_COMMUNITY)

## 2024-06-07 DIAGNOSIS — I4891 Unspecified atrial fibrillation: Secondary | ICD-10-CM | POA: Diagnosis not present

## 2024-06-07 DIAGNOSIS — N179 Acute kidney failure, unspecified: Secondary | ICD-10-CM | POA: Diagnosis not present

## 2024-06-07 DIAGNOSIS — K56609 Unspecified intestinal obstruction, unspecified as to partial versus complete obstruction: Secondary | ICD-10-CM | POA: Diagnosis not present

## 2024-06-07 DIAGNOSIS — E785 Hyperlipidemia, unspecified: Secondary | ICD-10-CM | POA: Diagnosis not present

## 2024-06-07 LAB — BASIC METABOLIC PANEL WITH GFR
Anion gap: 16 — ABNORMAL HIGH (ref 5–15)
BUN: 48 mg/dL — ABNORMAL HIGH (ref 8–23)
CO2: 28 mmol/L (ref 22–32)
Calcium: 9.4 mg/dL (ref 8.9–10.3)
Chloride: 95 mmol/L — ABNORMAL LOW (ref 98–111)
Creatinine, Ser: 1.14 mg/dL (ref 0.61–1.24)
GFR, Estimated: >60 mL/min (ref >60)
Glucose, Bld: 105 mg/dL — ABNORMAL HIGH (ref 70–99)
Potassium: 3.4 mmol/L — ABNORMAL LOW (ref 3.5–5.1)
Sodium: 139 mmol/L (ref 135–145)

## 2024-06-07 LAB — CBC
HCT: 42.3 % (ref 39.0–52.0)
Hemoglobin: 14.2 g/dL (ref 13.0–17.0)
MCH: 31.3 pg (ref 26.0–34.0)
MCHC: 33.6 g/dL (ref 30.0–36.0)
MCV: 93.4 fL (ref 80.0–100.0)
Platelets: 192 K/uL (ref 150–400)
RBC: 4.53 MIL/uL (ref 4.22–5.81)
RDW: 13 % (ref 11.5–15.5)
WBC: 9.8 K/uL (ref 4.0–10.5)
nRBC: 0 % (ref 0.0–0.2)

## 2024-06-07 LAB — APTT
aPTT: 98 s — ABNORMAL HIGH (ref 24–36)
aPTT: 94 s — ABNORMAL HIGH (ref 24–36)

## 2024-06-07 LAB — HEPARIN LEVEL (UNFRACTIONATED): Heparin Unfractionated: >1.1 [IU]/mL — ABNORMAL HIGH (ref 0.30–0.70)

## 2024-06-07 LAB — PHOSPHORUS: Phosphorus: 2.3 mg/dL — ABNORMAL LOW (ref 2.5–4.6)

## 2024-06-07 LAB — MAGNESIUM: Magnesium: 2.3 mg/dL (ref 1.7–2.4)

## 2024-06-07 MED ORDER — POTASSIUM CHLORIDE 10 MEQ/100ML IV SOLN
10.0000 meq | INTRAVENOUS | Status: AC
Start: 2024-06-07 — End: 2024-06-08
  Administered 2024-06-07 (×2): 10 meq via INTRAVENOUS
  Filled 2024-06-07 (×2): qty 100

## 2024-06-07 MED ORDER — DEXTROSE 5 % IV SOLN
30.0000 mmol | Freq: Once | INTRAVENOUS | Status: AC
  Administered 2024-06-07: 30 mmol via INTRAVENOUS
  Filled 2024-06-07: qty 10

## 2024-06-07 MED ORDER — LACTATED RINGERS IV SOLN
INTRAVENOUS | Status: DC
  Administered 2024-06-08: 600 mL via INTRAVENOUS
  Administered 2024-06-08: 975 mL via INTRAVENOUS

## 2024-06-07 NOTE — Progress Notes (Addendum)
 [facility] Progress Note     Subjective: CC:  Denies abdominal pain. Denies flatus/BM. Walked in the hall yesterday.  NG-1550 mL/24h   HR 100-110 bpm during my exam.   Objective: Vital signs in last 24 hours: Temp:  [97.6 F (36.4 C)-98.6 F (37 C)] 97.6 F (36.4 C) (09/10 0756) Pulse Rate:  [93-110] 110 (09/10 0756) Resp:  [17-20] 19 (09/10 0756) BP: (103-121)/(65-74) 112/66 (09/10 0756) SpO2:  [90 %-93 %] 93 % (09/10 0756)    Intake/Output from previous day: 09/09 0701 - 09/10 0700 In: 1044.3 [P.O.:360; I.V.:684.3] Out: 2050 [Urine:500; Emesis/NG output:1550] Intake/Output this shift: Total I/O In: -  Out: 200 [Urine:200]  PE: Gen:  Alert, NAD, pleasant Card:  tachycardia low 100's Pulm:  Normal effort Abd: Soft, interval decrease in distention, abdominal wall defect is softer, LIH is partially reducible  NG - brown effluent (1550 mL/24h) Skin: warm and dry, no rashes  Psych: A&Ox3   Lab Results:  Recent Labs    06/06/24 0530 06/07/24 0432  WBC 7.3 9.8  HGB 14.8 14.2  HCT 43.3 42.3  PLT 168 192   BMET Recent Labs    06/06/24 0530 06/07/24 0432  NA 138 139  K 3.4* 3.4*  CL 94* 95*  CO2 30 28  GLUCOSE 104* 105*  BUN 48* 48*  CREATININE 1.07 1.14  CALCIUM  9.1 9.4   PT/INR Recent Labs    06/05/24 1241  LABPROT 17.5*  INR 1.4*   CMP     Component Value Date/Time   NA 139 06/07/2024 0432   NA 143 04/23/2022 1454   K 3.4 (L) 06/07/2024 0432   CL 95 (L) 06/07/2024 0432   CO2 28 06/07/2024 0432   GLUCOSE 105 (H) 06/07/2024 0432   BUN 48 (H) 06/07/2024 0432   BUN 23 04/23/2022 1454   CREATININE 1.14 06/07/2024 0432   CALCIUM  9.4 06/07/2024 0432   PROT 7.9 06/05/2024 1241   ALBUMIN  3.9 06/05/2024 1241   AST 37 06/05/2024 1241   ALT 22 06/05/2024 1241   ALKPHOS 50 06/05/2024 1241   BILITOT 1.4 (H) 06/05/2024 1241   GFRNONAA >60 06/07/2024 0432   GFRAA  08/14/2007 0312    >60        The eGFR has been calculated using  the MDRD equation. This calculation has not been validated in all clinical   Lipase     Component Value Date/Time   LIPASE 18 06/05/2024 1241       Studies/Results: DG Abd Portable 1V-Small Bowel Obstruction Protocol-initial, 8 hr delay Result Date: 06/07/2024 CLINICAL DATA:  Small bowel obstruction, 8 hour delay EXAM: PORTABLE ABDOMEN - 1 VIEW COMPARISON:  CT and plain films 06/05/2024 FINDINGS: NG tube is in the stomach. Oral contrast material seen within dilated small bowel loops. No contrast within the colon. Contrast seen in the bladder from prior CT. No organomegaly or free air. Prior right hip replacement. IMPRESSION: Oral contrast material within dilated small bowel loops. No passage into colon as of yet. Electronically Signed   By: Franky Crease M.D.   On: 06/07/2024 00:47   DG Abd Portable 1 View Result Date: 06/05/2024 CLINICAL DATA:  NG tube placement EXAM: PORTABLE ABDOMEN - 1 VIEW COMPARISON:  05/12/2021.  CT today. FINDINGS: NG tube tip is in the fundus of the stomach with the side port near the GE junction. Mildly dilated abdominal small bowel loops as seen on CT. IMPRESSION: NG tube tip in the fundus of the stomach. Electronically  Signed   By: Franky Crease M.D.   On: 06/05/2024 17:20   CT ABDOMEN PELVIS W CONTRAST Result Date: 06/05/2024 EXAM: CT ABDOMEN AND PELVIS WITH CONTRAST 06/05/2024 02:41:01 PM TECHNIQUE: CT of the abdomen and pelvis was performed with the administration of intravenous contrast. Multiplanar reformatted images are provided for review. Automated exposure control, iterative reconstruction, and/or weight-based adjustment of the mA/kV was utilized to reduce the radiation dose to as low as reasonably achievable. COMPARISON: CT of 06/08/2021 CLINICAL HISTORY: Bowel obstruction suspected. FINDINGS: LOWER CHEST: Bibasilar scarring. LIVER: No acute abnormality. GALLBLADDER AND BILE DUCTS: Suspect subtle dependent gallstones. No acute cholecystitis or biliary duct  dilatation. SPLEEN: No acute abnormality. PANCREAS: Normal pancreas for age. ADRENAL GLANDS: No acute abnormality. KIDNEYS, URETERS AND BLADDER: Dominant upper pole left renal 9.5 cm simple cyst. Other bilateral renal lesions are likely cysts and do not warrant imaging follow up. No hydronephrosis. No perinephric or periureteral stranding. Urinary bladder is unremarkable. GI AND BOWEL: Left inguinal hernia contains nonobstructive sigmoid colon. Decompressed terminal ileum. Proximal and mid small bowel loops are moderately dilated and fluid filled including at 4.5 cm on image 25/3. Focal transition identified within the upper pelvis including on the images 46-43 of series 3. No obstructive mass. Just distal to this, small bowel enters an area of ventral abdominal wall laxity. Prior enterotomy felt to be distal to the site of obstruction. No evidence of complicating ischemia. PERITONEUM AND RETROPERITONEUM: No free intraperitoneal air or abdominal pelvic ascites. VASCULATURE: Aortic atherosclerosis. Retroaortic left renal vein. LYMPH NODES: No lymphadenopathy. REPRODUCTIVE ORGANS: Mild prostatomegaly. BONES AND SOFT TISSUES: Convex right lumbar spine curvature. Lumbosacral spondylosis. Beam hardening artifact degrees evaluation of the pelvis secondary to right hip arthroplasty. No acute osseous abnormality. No focal soft tissue abnormality. IMPRESSION: 1. High-grade small bowel obstruction, likely secondary to adhesions. No complicating ischemia 2. Inguinal hernia containing nonobstructive sigmoid. 3. Probable cholelithiasis 4. beam hardening artifact limits evaluation of the pelvis secondary to right hip arthroplasty. Electronically signed by: Rockey Kilts MD 06/05/2024 03:17 PM EDT RP Workstation: [id number]    Anti-infectives: Anti-infectives (From admission, onward)    None        Assessment/Plan  SBO - prior abdominal surgery includes open appendectomy, open inguinal hernia repair with mesh,  exploratory laparotomy with small bowel resection  - CT 9/8 with high-grade SBO, inguinal hernia containing nonobstructive sigmoid colon, probable cholelithiasis, right hip arthroplasty - SBO protocol >> Contrast remains in small bowel on 24h film - continue NG to LIWS - failure to improve may warrant surgery as early as tomorrow which would likely be exploratory laparotomy, LOA, possible bowel resection, and left inguinal hernia repair.  Please hold heparin  drip at 0200 for possible surgery.   FEN: NPO, IVF per admitting, NGT to LIWS  VTE: hold eliquis , ok to have heparin  gtt if needed from surgical standpoint ID: no abx indicated from general surgery standpoint   - per TRH -  Hx of SVT Atrial fibrillation on Eliquis  Trivial MR Ascending aortic aneurysm  Macular degeneration  Hx of basal cell carcinoma  Chronic hoarseness     LOS: 2 days   I reviewed nursing notes, hospitalist notes, last 24 h vitals and pain scores, last 48 h intake and output, last 24 h labs and trends, and last 24 h imaging results.  This care required moderate level of medical decision making.   Almarie Pringle, PA-C [facility] Please see Amion for pager number during day hours 7:00am-4:30pm

## 2024-06-07 NOTE — Progress Notes (Signed)
 PHARMACY - ANTICOAGULATION CONSULT NOTE  Pharmacy Consult for heparin  Indication: atrial fibrillation  No Known Allergies  Patient Measurements: Weight: 61.2 kg (135 lb)  Vital Signs: Temp: 97.6 F (36.4 C) (09/10 0756) Temp Source: Oral (09/10 0756) BP: 112/66 (09/10 0756) Pulse Rate: 110 (09/10 0756)  Labs: Recent Labs    06/05/24 1241 06/05/24 1241 06/06/24 0530 06/06/24 1335 06/07/24 0432 06/07/24 1445  HGB 16.0  --  14.8  --  14.2  --   HCT 45.8  --  43.3  --  42.3  --   PLT 238  --  168  --  192  --   APTT  --    < > 73* 59* 98* 94*  LABPROT 17.5*  --   --   --   --   --   INR 1.4*  --   --   --   --   --   HEPARINUNFRC  --   --  >1.10*  --  >1.10*  --   CREATININE 1.81*  --  1.07  --  1.14  --    < > = values in this interval not displayed.    Estimated Creatinine Clearance: 36.5 mL/min (by C-G formula based on SCr of 1.14 mg/dL).   Medical History: Past Medical History:  Diagnosis Date   Astigmatism    Basal cell carcinoma    Inguinal hernia    bilateral   Macular degeneration, age related, nonexudative    bilateral   SVT (supraventricular tachycardia) (HCC)      Assessment: 89 yo M presenting with abdominal pain, constipation, in Afib RVR, hx of afib on Eliquis  PTA with last dose 9/8 AM, on hold for SBO eval.    -aPTT= 94 and at goal on heparin  1050 units/hr  Goal of Therapy:  Heparin  level 0.3-0.7 units/ml aPTT 66-102 seconds Monitor platelets by anticoagulation protocol: Yes   Plan:  Cont heparin  1050 units/hr Heparin  to be off at 2am for possible OR on 9/11  Prentice Poisson, PharmD Clinical Pharmacist **Pharmacist phone directory can now be found on [email or web address] ([email or web address]).  Listed under [facility].

## 2024-06-07 NOTE — Progress Notes (Signed)
 PROGRESS NOTE        PATIENT DETAILS Name: Gary Rice Age: 89 y.o. Sex: male Date of Birth: [birth year] Admit Date: 06/05/2024 Admitting Physician Delon Hoe, DO ERE:Fnmmnt, Beverley, MD  Brief Summary: Patient is a 89 y.o.  male with history of A-fib-presented with SBO and A-fib RVR.  Significant events: 9/8>> admit to [facility]  Significant studies: 9/8>> CT abdomen/pelvis: High-grade SBO. 9/9>> SBO protocol x-ray: No passage of contrast into colon yet.  Significant microbiology data: None  Procedures: None  Consults: None  Subjective: No BM/flatus overnight.  Objective: Vitals: Blood pressure 112/66, pulse (!) 110, temperature 97.6 F (36.4 C), temperature source Oral, resp. rate 19, weight 61.2 kg, SpO2 93%.   Exam: Gen Exam:Alert awake-not in any distress HEENT:atraumatic, normocephalic Chest: B/L clear to auscultation anteriorly CVS:S1S2 regular Abdomen:soft non tender, non distended Extremities:no edema Neurology: Non focal Skin: no rash  Pertinent Labs/Radiology:    Latest Ref Rng & Units 06/07/2024    4:32 AM 06/06/2024    5:30 AM 06/05/2024   12:41 PM  CBC  WBC 4.0 - 10.5 K/uL 9.8  7.3  8.7   Hemoglobin 13.0 - 17.0 g/dL 85.7  85.1  83.9   Hematocrit 39.0 - 52.0 % 42.3  43.3  45.8   Platelets 150 - 400 K/uL 192  168  238     Lab Results  Component Value Date   NA 139 06/07/2024   K 3.4 (L) 06/07/2024   CL 95 (L) 06/07/2024   CO2 28 06/07/2024     Assessment/Plan: SBO Remains with NGT suction-no improvement overnight-with no BM or flatus. Await further input from general surgery  A-fib RVR Rate controlled Cardizem /heparin  drip  AKI Hemodynamically mediated Resolved.  Hypokalemia Likely secondary to NGT suctioning. Replete/recheck.  HLD Hold statins for now-resume when oral intake stable.  Code status:   Code Status: Full Code   DVT Prophylaxis: IV heparin   Family Communication: None at  bedside   Disposition Plan: Status is: Inpatient Remains inpatient appropriate because: Severity of illness   Planned Discharge Destination:Home health   Diet: Diet Order             Diet NPO time specified  Diet effective now                     Antimicrobial agents: Anti-infectives (From admission, onward)    None        MEDICATIONS: Scheduled Meds:  Influenza vac split trivalent PF  0.5 mL Intramuscular Tomorrow-1000   ipratropium  2 spray Each Nare BID   pantoprazole  (PROTONIX ) IV  40 mg Intravenous Daily   sodium chloride  flush  3 mL Intravenous Q12H   Continuous Infusions:  diltiazem  (CARDIZEM ) infusion 7.5 mg/hr (06/07/24 0350)   heparin  1,050 Units/hr (06/06/24 2241)   potassium PHOSPHATE  IVPB (in mmol) 30 mmol (06/07/24 0930)   PRN Meds:.HYDROmorphone  (DILAUDID ) injection, ondansetron  **OR** ondansetron  (ZOFRAN ) IV   I have personally reviewed following labs and imaging studies  LABORATORY DATA: CBC: Recent Labs  Lab 06/05/24 1241 06/06/24 0530 06/07/24 0432  WBC 8.7 7.3 9.8  NEUTROABS 6.1  --   --   HGB 16.0 14.8 14.2  HCT 45.8 43.3 42.3  MCV 91.1 92.9 93.4  PLT 238 168 192    Basic Metabolic Panel: Recent Labs  Lab 06/05/24 1241 06/06/24 0529 06/06/24 0530  06/07/24 0432  NA 132*  --  138 139  K 3.9  --  3.4* 3.4*  CL 87*  --  94* 95*  CO2 25  --  30 28  GLUCOSE 134*  --  104* 105*  BUN 69*  --  48* 48*  CREATININE 1.81*  --  1.07 1.14  CALCIUM  10.0  --  9.1 9.4  MG  --  2.1  --  2.3  PHOS  --   --   --  2.3*    GFR: Estimated Creatinine Clearance: 36.5 mL/min (by C-G formula based on SCr of 1.14 mg/dL).  Liver Function Tests: Recent Labs  Lab 06/05/24 1241  AST 37  ALT 22  ALKPHOS 50  BILITOT 1.4*  PROT 7.9  ALBUMIN  3.9   Recent Labs  Lab 06/05/24 1241  LIPASE 18   No results for input(s): AMMONIA in the last 168 hours.  Coagulation Profile: Recent Labs  Lab 06/05/24 1241  INR 1.4*    Cardiac  Enzymes: No results for input(s): CKTOTAL, CKMB, CKMBINDEX, TROPONINI in the last 168 hours.  BNP (last 3 results) No results for input(s): PROBNP in the last 8760 hours.  Lipid Profile: No results for input(s): CHOL, HDL, LDLCALC, TRIG, CHOLHDL, LDLDIRECT in the last 72 hours.  Thyroid  Function Tests: No results for input(s): TSH, T4TOTAL, FREET4, T3FREE, THYROIDAB in the last 72 hours.  Anemia Panel: No results for input(s): VITAMINB12, FOLATE, FERRITIN, TIBC, IRON , RETICCTPCT in the last 72 hours.  Urine analysis:    Component Value Date/Time   COLORURINE YELLOW 02/23/2022 1703   APPEARANCEUR CLEAR 02/23/2022 1703   LABSPEC 1.015 02/23/2022 1703   PHURINE 6.0 02/23/2022 1703   GLUCOSEU NEGATIVE 02/23/2022 1703   HGBUR NEGATIVE 02/23/2022 1703   BILIRUBINUR NEGATIVE 02/23/2022 1703   KETONESUR 5 (A) 02/23/2022 1703   PROTEINUR NEGATIVE 02/23/2022 1703   NITRITE NEGATIVE 02/23/2022 1703   LEUKOCYTESUR NEGATIVE 02/23/2022 1703    Sepsis Labs: Lactic Acid, Venous No results found for: LATICACIDVEN  MICROBIOLOGY: Recent Results (from the past 240 hours)  MRSA Next Gen by PCR, Nasal     Status: None   Collection Time: 06/06/24 12:45 AM   Specimen: Nasal Mucosa; Nasal Swab  Result Value Ref Range Status   MRSA by PCR Next Gen NOT DETECTED NOT DETECTED Final    Comment: (NOTE) The GeneXpert MRSA Assay (FDA approved for NASAL specimens only), is one component of a comprehensive MRSA colonization surveillance program. It is not intended to diagnose MRSA infection nor to guide or monitor treatment for MRSA infections. Test performance is not FDA approved in patients less than 56 years old. Performed at [facility], [address], [place], [place] [address]     RADIOLOGY STUDIES/RESULTS: DG Abd Portable 1V-Small Bowel Obstruction Protocol-initial, 8 hr delay Result Date: 06/07/2024 CLINICAL DATA:  Small bowel  obstruction, 8 hour delay EXAM: PORTABLE ABDOMEN - 1 VIEW COMPARISON:  CT and plain films 06/05/2024 FINDINGS: NG tube is in the stomach. Oral contrast material seen within dilated small bowel loops. No contrast within the colon. Contrast seen in the bladder from prior CT. No organomegaly or free air. Prior right hip replacement. IMPRESSION: Oral contrast material within dilated small bowel loops. No passage into colon as of yet. Electronically Signed   By: Franky Crease M.D.   On: 06/07/2024 00:47   DG Abd Portable 1 View Result Date: 06/05/2024 CLINICAL DATA:  NG tube placement EXAM: PORTABLE ABDOMEN - 1 VIEW  COMPARISON:  05/12/2021.  CT today. FINDINGS: NG tube tip is in the fundus of the stomach with the side port near the GE junction. Mildly dilated abdominal small bowel loops as seen on CT. IMPRESSION: NG tube tip in the fundus of the stomach. Electronically Signed   By: Franky Crease M.D.   On: 06/05/2024 17:20   CT ABDOMEN PELVIS W CONTRAST Result Date: 06/05/2024 EXAM: CT ABDOMEN AND PELVIS WITH CONTRAST 06/05/2024 02:41:01 PM TECHNIQUE: CT of the abdomen and pelvis was performed with the administration of intravenous contrast. Multiplanar reformatted images are provided for review. Automated exposure control, iterative reconstruction, and/or weight-based adjustment of the mA/kV was utilized to reduce the radiation dose to as low as reasonably achievable. COMPARISON: CT of 06/08/2021 CLINICAL HISTORY: Bowel obstruction suspected. FINDINGS: LOWER CHEST: Bibasilar scarring. LIVER: No acute abnormality. GALLBLADDER AND BILE DUCTS: Suspect subtle dependent gallstones. No acute cholecystitis or biliary duct dilatation. SPLEEN: No acute abnormality. PANCREAS: Normal pancreas for age. ADRENAL GLANDS: No acute abnormality. KIDNEYS, URETERS AND BLADDER: Dominant upper pole left renal 9.5 cm simple cyst. Other bilateral renal lesions are likely cysts and do not warrant imaging follow up. No hydronephrosis. No  perinephric or periureteral stranding. Urinary bladder is unremarkable. GI AND BOWEL: Left inguinal hernia contains nonobstructive sigmoid colon. Decompressed terminal ileum. Proximal and mid small bowel loops are moderately dilated and fluid filled including at 4.5 cm on image 25/3. Focal transition identified within the upper pelvis including on the images 46-43 of series 3. No obstructive mass. Just distal to this, small bowel enters an area of ventral abdominal wall laxity. Prior enterotomy felt to be distal to the site of obstruction. No evidence of complicating ischemia. PERITONEUM AND RETROPERITONEUM: No free intraperitoneal air or abdominal pelvic ascites. VASCULATURE: Aortic atherosclerosis. Retroaortic left renal vein. LYMPH NODES: No lymphadenopathy. REPRODUCTIVE ORGANS: Mild prostatomegaly. BONES AND SOFT TISSUES: Convex right lumbar spine curvature. Lumbosacral spondylosis. Beam hardening artifact degrees evaluation of the pelvis secondary to right hip arthroplasty. No acute osseous abnormality. No focal soft tissue abnormality. IMPRESSION: 1. High-grade small bowel obstruction, likely secondary to adhesions. No complicating ischemia 2. Inguinal hernia containing nonobstructive sigmoid. 3. Probable cholelithiasis 4. beam hardening artifact limits evaluation of the pelvis secondary to right hip arthroplasty. Electronically signed by: Rockey Kilts MD 06/05/2024 03:17 PM EDT RP Workstation: [id number]     LOS: 2 days   Donalda Applebaum, MD  [facility]    To contact the attending provider between 7A-7P or the covering provider during after hours 7P-7A, please log into the web site [email or web address] and access using universal [place] password for that web site. If you do not have the password, please call the hospital operator.  06/07/2024, 10:24 AM

## 2024-06-07 NOTE — TOC Initial Note (Signed)
 Transition of Care [facility]) - Initial/Assessment Note    Patient Details  Name: Gary Rice MRN: [id number] Date of Birth: July 20, 1931  Transition of Care [facility]) CM/SW Contact:    Marval Gell, RN Phone Number: 06/07/2024, 10:02 AM  Clinical Narrative:                  Spoke w patient at bedside. He states that he lives at home w wife. He has cane, shower seat, and bulky RW at home. Will order new walker per patient request, through [employer] to be delivered to the room.  He is agreeable to [facility] services and would like to use [facility]. Referral accepted and order placed for disciples requested. Needs face to face completed on [facility] order please.   Expected Discharge Plan: Home w Home Health Services Barriers to Discharge: Continued Medical Work up   Patient Goals and CMS Choice Patient states their goals for this hospitalization and ongoing recovery are:: to go home CMS Medicare.gov Compare Post Acute Care list provided to:: Patient Choice offered to / list presented to : Patient      Expected Discharge Plan and Services   Discharge Planning Services: CM Consult Post Acute Care Choice: Home Health, Durable Medical Equipment Living arrangements for the past 2 months: Single Family Home                                      Prior Living Arrangements/Services Living arrangements for the past 2 months: Single Family Home Lives with:: Spouse                   Activities of Daily Living   ADL Screening (condition at time of admission) Independently performs ADLs?: No Does the patient have a NEW difficulty with bathing/dressing/toileting/self-feeding that is expected to last >3 days?: No Does the patient have a NEW difficulty with getting in/out of bed, walking, or climbing stairs that is expected to last >3 days?: No Does the patient have a NEW difficulty with communication that is expected to last >3 days?: No Is the patient deaf or have difficulty hearing?: No Does  the patient have difficulty seeing, even when wearing glasses/contacts?: No Does the patient have difficulty concentrating, remembering, or making decisions?: No  Permission Sought/Granted                  Emotional Assessment              Admission diagnosis:  Small bowel obstruction (HCC) [K56.609] SBO (small bowel obstruction) (HCC) [K56.609] Atrial fibrillation with RVR (HCC) [I48.91] Patient Active Problem List   Diagnosis Date Noted   AKI (acute kidney injury) (HCC) 06/05/2024   Syncope 02/23/2022   Paroxysmal A-fib (HCC) 02/23/2022   Head injury 02/23/2022   Ileus (HCC) 06/08/2021   Cholecystitis, acute    Acute blood loss anemia    History of supraventricular tachycardia    Hyponatremia    Debility 05/26/2021   Pelvic abscess in male [facility]) 05/14/2021   Malnutrition of moderate degree 05/12/2021   Atrial fibrillation with rapid ventricular response (HCC) 05/02/2021   SBO (small bowel obstruction) (HCC) 05/02/2021   Leukocytosis 05/02/2021   Hyperlipidemia 05/02/2021   Thoracic aortic aneurysm (HCC) 05/02/2021   Incarcerated hernia    PCP:  Kip Righter, MD Pharmacy:   [phone] - [place], [place] - [address] [place] [address] Phone: [phone]  Fax: [phone]  [name] [address] [place] [address] Phone: [phone] Fax: [phone]     Social Drivers of Health (SDOH) Social History: SDOH Screenings   Food Insecurity: No Food Insecurity (06/06/2024)  Housing: Low Risk  (06/06/2024)  Transportation Needs: No Transportation Needs (06/06/2024)  Utilities: Not At Risk (06/06/2024)  Depression (PHQ2-9): Low Risk  (06/18/2021)  Social Connections: Moderately Integrated (06/06/2024)  Tobacco Use: Medium Risk (06/05/2024)   SDOH Interventions:     Readmission Risk Interventions     No data to display

## 2024-06-07 NOTE — Progress Notes (Signed)
 Physical Therapy Treatment Patient Details Name: Gary Rice MRN: [id number] DOB: March 27, 1931 Today's Date: 06/07/2024   History of Present Illness Pt is a 89 y.o. M who presents 06/05/2024 with SBO. Significant PMH:  A-fib on Cardizem  and Eliquis , history of SVT, history of inguinal hernia, history of bowel resection in 2022, inguinal hernia repair 2022.    PT Comments  Pt seen for PT tx with pt agreeable. Pt requesting to use IV pole vs RW for ambulation. Pt ambulated around unit with IV pole with supervision, multiple pauses 2/2 elevated HR but pt without c/o symptoms. Hopeful to practice stair negotiation but pt with elevated HR with minimal gait so deferred stairs on this date. Pt performed 10x sit<>stand without BUE support with focus on BLE strengthening & endurance training. Recommend ongoing PT services to progress mobility as able. Notified nurse of pt being back in bed so nurse can reconnect NG tube.    If plan is discharge home, recommend the following: Assistance with cooking/housework;Assist for transportation;Help with stairs or ramp for entrance   Can travel by private vehicle        Equipment Recommendations  None recommended by PT    Recommendations for Other Services       Precautions / Restrictions Precautions Precautions: Fall;Other (comment) Precaution/Restrictions Comments: watch HR Restrictions Weight Bearing Restrictions Per Provider Order: No     Mobility  Bed Mobility Overal bed mobility: Modified Independent             General bed mobility comments: supine<>sit with HOB elevated, repositioned in bed without assistance    Transfers Overall transfer level: Needs assistance Equipment used: None Transfers: Sit to/from Stand Sit to Stand: Supervision                Ambulation/Gait Ambulation/Gait assistance: Supervision Gait Distance (Feet): 150 Feet Assistive device: IV Pole Gait Pattern/deviations: Decreased step length - right,  Decreased step length - left, Decreased dorsiflexion - right Gait velocity: decreased     General Gait Details: Pt ambulates around nurses station with 1UE support on IV pole, decreased foot clearance RLE, decreased dorsiflexion & heel strike RLE. Multiple pauses 2/2 elevated HR.   Stairs             Wheelchair Mobility     Tilt Bed    Modified Rankin (Stroke Patients Only)       Balance Overall balance assessment: Needs assistance Sitting-balance support: Feet supported Sitting balance-Leahy Scale: Good     Standing balance support: During functional activity, Reliant on assistive device for balance, Single extremity supported Standing balance-Leahy Scale: Good                              Communication Communication Communication: No apparent difficulties  Cognition Arousal: Alert Behavior During Therapy: WFL for tasks assessed/performed   PT - Cognitive impairments: No apparent impairments                         Following commands: Intact      Cueing Cueing Techniques: Verbal cues  Exercises Other Exercises Other Exercises: Pt performed 10x sit<>stand from EOB without BUE support but not keeping arms crossed against chest, instead holding BUE for leverage; pt performed sit<>stand with supervision with focus on BLE strengthening & endurance training. Pt reports he can feel his heart beat at one point, HR 156 bpm, encouraged a rest break, HR decreased & pt  without any other c/o symptoms during session.    General Comments General comments (skin integrity, edema, etc.): max HR 156 bpm, SpO2 ranging 77-91% on 2L/min but pt without c/o SOB, without appearance of distress, question poor SpO2 monitor      Pertinent Vitals/Pain Pain Assessment Pain Assessment: No/denies pain    Home Living                          Prior Function            PT Goals (current goals can now be found in the care plan section) Acute Rehab PT  Goals Patient Stated Goal: get better PT Goal Formulation: With patient Time For Goal Achievement: 06/20/24 Potential to Achieve Goals: Good Progress towards PT goals: Progressing toward goals    Frequency    Min 2X/week      PT Plan      Co-evaluation              AM-PAC PT 6 Clicks Mobility   Outcome Measure  Help needed turning from your back to your side while in a flat bed without using bedrails?: None Help needed moving from lying on your back to sitting on the side of a flat bed without using bedrails?: None Help needed moving to and from a bed to a chair (including a wheelchair)?: A Little Help needed standing up from a chair using your arms (e.g., wheelchair or bedside chair)?: A Little Help needed to walk in hospital room?: A Little Help needed climbing 3-5 steps with a railing? : A Little 6 Click Score: 20    End of Session   Activity Tolerance: Patient tolerated treatment well Patient left: in bed;with call bell/phone within reach;with bed alarm set   PT Visit Diagnosis: Unsteadiness on feet (R26.81);Other abnormalities of gait and mobility (R26.89)     Time: 8673-8648 PT Time Calculation (min) (ACUTE ONLY): 25 min  Charges:    $Therapeutic Activity: 23-37 mins PT General Charges $$ ACUTE PT VISIT: 1 Visit                     Richerd Pinal, PT, DPT 06/07/24, 2:03 PM   Richerd CHRISTELLA Pinal 06/07/2024, 2:03 PM

## 2024-06-07 NOTE — Progress Notes (Signed)
 PHARMACY - ANTICOAGULATION CONSULT NOTE  Pharmacy Consult for heparin  Indication: atrial fibrillation  No Known Allergies  Patient Measurements: Weight: 61.2 kg (135 lb)  Vital Signs: Temp: 97.8 F (36.6 C) (09/10 0400) Temp Source: Oral (09/10 0400) BP: 110/67 (09/10 0400) Pulse Rate: 104 (09/10 0400)  Labs: Recent Labs    06/05/24 1241 06/06/24 0530 06/06/24 1335 06/07/24 0432  HGB 16.0 14.8  --  14.2  HCT 45.8 43.3  --  42.3  PLT 238 168  --  192  APTT  --  73* 59* 98*  LABPROT 17.5*  --   --   --   INR 1.4*  --   --   --   HEPARINUNFRC  --  >1.10*  --  >1.10*  CREATININE 1.81* 1.07  --  1.14    Estimated Creatinine Clearance: 36.5 mL/min (by C-G formula based on SCr of 1.14 mg/dL).   Medical History: Past Medical History:  Diagnosis Date   Astigmatism    Basal cell carcinoma    Inguinal hernia    bilateral   Macular degeneration, age related, nonexudative    bilateral   SVT (supraventricular tachycardia) (HCC)      Assessment: 89 yo M presenting with abdominal pain, constipation, in Afib RVR, hx of afib on Eliquis  PTA with last dose 9/8 AM, on hold for SBO eval.    AM: aPTT therapeutic on heparin  at 950 units/hr.  Heparin  level elevated as expected.  Patient remains NPO with NGT placed to suction from SBO.  PM: aPTT subtherapeutic at 59 on 950 units/hr. No issues with the heparin  infusion or bleeding reported per RN.  9/10 AM update:  aPTT therapeutic after rate increase  Goal of Therapy:  Heparin  level 0.3-0.7 units/ml aPTT 66-102 seconds Monitor platelets by anticoagulation protocol: Yes   Plan:  Cont heparin  1050 units/hr Heparin  level and aPTT in 8 hours  Lynwood Mckusick, PharmD, BCPS Clinical Pharmacist Phone: [phone]

## 2024-06-07 NOTE — Progress Notes (Addendum)
   06/07/24 0849  Mobility  Activity Ambulated with assistance  Level of Assistance Standby assist, set-up cues, supervision of patient - no hands on  Assistive Device Other (Comment) (IV Pole)  Distance Ambulated (ft) 150 ft  Activity Response Tolerated fair  Mobility Referral Yes  Mobility visit 1 Mobility  Mobility Specialist Start Time (ACUTE ONLY) 0849  Mobility Specialist Stop Time (ACUTE ONLY) 0914  Mobility Specialist Time Calculation (min) (ACUTE ONLY) 25 min   Mobility Specialist: Progress Note- Visits:2  During Mobility: HR 133-150 Post-Mobility:    HR 119, SpO2 89-92% 2L  Pt agreeable to mobility session - received in bed. C/o feeling winded after walking. Returned to chair with all needs met - call bell within reach. Chair alarm on. Pt adamant about walking despite high HR states, he does not want to be in the same condition as he was three years ago.   __________________________________________________________________________  Post-Mobility:    HR 108-112, SpO2 93% 2L  Pt agreeable to mobility session - received in bed. Pt was asymptomatic throughout session with no complaints. Returned to chair with all needs met - call bell within reach. Student RN present.   Virgle Boards, BS Mobility Specialist Please contact via SecureChat or  Rehab office at [phone].

## 2024-06-07 NOTE — Plan of Care (Signed)

## 2024-06-08 ENCOUNTER — Inpatient Hospital Stay (HOSPITAL_COMMUNITY)

## 2024-06-08 DIAGNOSIS — I4891 Unspecified atrial fibrillation: Secondary | ICD-10-CM | POA: Diagnosis not present

## 2024-06-08 DIAGNOSIS — E785 Hyperlipidemia, unspecified: Secondary | ICD-10-CM | POA: Diagnosis not present

## 2024-06-08 DIAGNOSIS — K56609 Unspecified intestinal obstruction, unspecified as to partial versus complete obstruction: Secondary | ICD-10-CM | POA: Diagnosis not present

## 2024-06-08 DIAGNOSIS — N179 Acute kidney failure, unspecified: Secondary | ICD-10-CM | POA: Diagnosis not present

## 2024-06-08 LAB — CBC
HCT: 41 % (ref 39.0–52.0)
Hemoglobin: 13.8 g/dL (ref 13.0–17.0)
MCH: 31.7 pg (ref 26.0–34.0)
MCHC: 33.7 g/dL (ref 30.0–36.0)
MCV: 94 fL (ref 80.0–100.0)
Platelets: 181 K/uL (ref 150–400)
RBC: 4.36 MIL/uL (ref 4.22–5.81)
RDW: 13.1 % (ref 11.5–15.5)
WBC: 12 K/uL — ABNORMAL HIGH (ref 4.0–10.5)
nRBC: 0 % (ref 0.0–0.2)

## 2024-06-08 LAB — APTT: aPTT: 77 s — ABNORMAL HIGH (ref 24–36)

## 2024-06-08 LAB — BASIC METABOLIC PANEL WITH GFR
Anion gap: 13 (ref 5–15)
BUN: 28 mg/dL — ABNORMAL HIGH (ref 8–23)
CO2: 29 mmol/L (ref 22–32)
Calcium: 9 mg/dL (ref 8.9–10.3)
Chloride: 97 mmol/L — ABNORMAL LOW (ref 98–111)
Creatinine, Ser: 0.85 mg/dL (ref 0.61–1.24)
GFR, Estimated: >60 mL/min (ref >60)
Glucose, Bld: 101 mg/dL — ABNORMAL HIGH (ref 70–99)
Potassium: 3.7 mmol/L (ref 3.5–5.1)
Sodium: 139 mmol/L (ref 135–145)

## 2024-06-08 LAB — PHOSPHORUS: Phosphorus: 1.9 mg/dL — ABNORMAL LOW (ref 2.5–4.6)

## 2024-06-08 LAB — HEPARIN LEVEL (UNFRACTIONATED): Heparin Unfractionated: 0.94 [IU]/mL — ABNORMAL HIGH (ref 0.30–0.70)

## 2024-06-08 LAB — MAGNESIUM: Magnesium: 2.1 mg/dL (ref 1.7–2.4)

## 2024-06-08 MED ORDER — HEPARIN (PORCINE) 25000 UT/250ML-% IV SOLN: 1050.0000 [IU]/h | INTRAVENOUS | Status: DC

## 2024-06-08 MED ORDER — HEPARIN (PORCINE) 25000 UT/250ML-% IV SOLN
1050.0000 [IU]/h | INTRAVENOUS | Status: DC
  Administered 2024-06-08: 1050 [IU]/h via INTRAVENOUS
  Filled 2024-06-08 (×2): qty 250

## 2024-06-08 MED ORDER — METOPROLOL TARTRATE 5 MG/5ML IV SOLN
2.5000 mg | Freq: Three times a day (TID) | INTRAVENOUS | Status: DC
  Administered 2024-06-08 – 2024-06-09 (×2): 2.5 mg via INTRAVENOUS
  Filled 2024-06-08 (×3): qty 5

## 2024-06-08 MED ORDER — POTASSIUM PHOSPHATES 15 MMOLE/5ML IV SOLN
30.0000 mmol | Freq: Once | INTRAVENOUS | Status: AC
  Administered 2024-06-08: 30 mmol via INTRAVENOUS
  Filled 2024-06-08: qty 10

## 2024-06-08 MED ORDER — BISACODYL 10 MG RE SUPP
10.0000 mg | Freq: Once | RECTAL | Status: AC
  Administered 2024-06-08: 10 mg via RECTAL
  Filled 2024-06-08: qty 1

## 2024-06-08 NOTE — Progress Notes (Signed)
 PHARMACY - ANTICOAGULATION CONSULT NOTE  Pharmacy Consult for heparin  Indication: atrial fibrillation  No Known Allergies  Patient Measurements: Weight: 61.2 kg (135 lb)  Vital Signs: Temp: 98.6 F (37 C) (09/11 2100) Temp Source: Oral (09/11 2100) BP: 113/79 (09/11 2100) Pulse Rate: 87 (09/11 2100)  Labs: Recent Labs    06/06/24 0530 06/06/24 1335 06/07/24 0432 06/07/24 1445 06/08/24 0250 06/08/24 2130  HGB 14.8  --  14.2  --  13.8  --   HCT 43.3  --  42.3  --  41.0  --   PLT 168  --  192  --  181  --   APTT 73*   < > 98* 94*  --  77*  HEPARINUNFRC >1.10*  --  >1.10*  --   --  0.94*  CREATININE 1.07  --  1.14  --  0.85  --    < > = values in this interval not displayed.    Estimated Creatinine Clearance: 49 mL/min (by C-G formula based on SCr of 0.85 mg/dL).   Medical History: Past Medical History:  Diagnosis Date   Astigmatism    Basal cell carcinoma    Inguinal hernia    bilateral   Macular degeneration, age related, nonexudative    bilateral   SVT (supraventricular tachycardia) (HCC)      Assessment: 89 yo M presenting with abdominal pain, constipation, in Afib RVR, hx of afib on Eliquis  PTA with last dose 9/8 AM, on hold for SBO eval.    IV heparin  resumed today s/p OR -aPTT at goal on 1050 units/hr -heparin  level 0.94 due to recent apixaban   Goal of Therapy:  Heparin  level 0.3-0.7 units/ml aPTT 66-102 seconds Monitor platelets by anticoagulation protocol: Yes   Plan:  Continue heparin  at 1050 units/hr Daily heparin  level and CBC  Prentice Poisson, PharmD Clinical Pharmacist **Pharmacist phone directory can now be found on [email or web address] ([email or web address]).  Listed under [facility].

## 2024-06-08 NOTE — Progress Notes (Signed)
 PROGRESS NOTE        PATIENT DETAILS Name: Gary Rice Age: 89 y.o. Sex: male Date of Birth: 25-Feb-1931 Admit Date: 06/05/2024 Admitting Physician Delon Hoe, DO ERE:Fnmmnt, Beverley, MD  Brief Summary: Patient is a 89 y.o.  male with history of A-fib-presented with SBO and A-fib RVR.  Significant events: 9/8>> admit to [facility]  Significant studies: 9/8>> CT abdomen/pelvis: High-grade SBO. 9/9>> SBO protocol x-ray: No passage of contrast into colon yet.  Significant microbiology data: None  Procedures: None  Consults: None  Subjective: No BM/flatus overnight.  Objective: Vitals: Blood pressure 122/79, pulse (!) 124, temperature 98.6 F (37 C), temperature source Oral, resp. rate 12, weight 61.2 kg, SpO2 (!) 89%.   Exam: Gen Exam:Alert awake-not in any distress HEENT:atraumatic, normocephalic Chest: B/L clear to auscultation anteriorly CVS:S1S2 regular Abdomen:soft non tender, non distended Extremities:no edema Neurology: Non focal Skin: no rash  Pertinent Labs/Radiology:    Latest Ref Rng & Units 06/08/2024    2:50 AM 06/07/2024    4:32 AM 06/06/2024    5:30 AM  CBC  WBC 4.0 - 10.5 K/uL 12.0  9.8  7.3   Hemoglobin 13.0 - 17.0 g/dL 86.1  85.7  85.1   Hematocrit 39.0 - 52.0 % 41.0  42.3  43.3   Platelets 150 - 400 K/uL 181  192  168     Lab Results  Component Value Date   NA 139 06/08/2024   K 3.7 06/08/2024   CL 97 (L) 06/08/2024   CO2 29 06/08/2024     Assessment/Plan: SBO No BM/flatus overnight but abd x-ray this morning with contrast in the colon General Surgery planning to clamp NG and try clear liquids  A-fib RVR Rate controlled Cardizem /heparin  drip  AKI Hemodynamically mediated Resolved.  Hypokalemia Likely secondary to NGT suctioning. Replete/recheck.  HLD Hold statins for now-resume when oral intake stable.  Code status:   Code Status: Full Code   DVT Prophylaxis: IV heparin   Family  Communication: Spouse-[phone] updated 9/11   Disposition Plan: Status is: Inpatient Remains inpatient appropriate because: Severity of illness   Planned Discharge Destination:Home health   Diet: Diet Order             Diet clear liquid Room service appropriate? Yes; Fluid consistency: Thin  Diet effective now                     Antimicrobial agents: Anti-infectives (From admission, onward)    None        MEDICATIONS: Scheduled Meds:  bisacodyl   10 mg Rectal Once   Influenza vac split trivalent PF  0.5 mL Intramuscular Tomorrow-1000   ipratropium  2 spray Each Nare BID   pantoprazole  (PROTONIX ) IV  40 mg Intravenous Daily   sodium chloride  flush  3 mL Intravenous Q12H   Continuous Infusions:  diltiazem  (CARDIZEM ) infusion 10 mg/hr (06/08/24 0828)   lactated ringers  600 mL (06/08/24 0903)   potassium PHOSPHATE  IVPB (in mmol) 30 mmol (06/08/24 0908)   PRN Meds:.HYDROmorphone  (DILAUDID ) injection, ondansetron  **OR** ondansetron  (ZOFRAN ) IV   I have personally reviewed following labs and imaging studies  LABORATORY DATA: CBC: Recent Labs  Lab 06/05/24 1241 06/06/24 0530 06/07/24 0432 06/08/24 0250  WBC 8.7 7.3 9.8 12.0*  NEUTROABS 6.1  --   --   --   HGB 16.0 14.8 14.2 13.8  HCT 45.8 43.3 42.3 41.0  MCV 91.1 92.9 93.4 94.0  PLT 238 168 192 181    Basic Metabolic Panel: Recent Labs  Lab 06/05/24 1241 06/06/24 0529 06/06/24 0530 06/07/24 0432 06/08/24 0250  NA 132*  --  138 139 139  K 3.9  --  3.4* 3.4* 3.7  CL 87*  --  94* 95* 97*  CO2 25  --  30 28 29   GLUCOSE 134*  --  104* 105* 101*  BUN 69*  --  48* 48* 28*  CREATININE 1.81*  --  1.07 1.14 0.85  CALCIUM  10.0  --  9.1 9.4 9.0  MG  --  2.1  --  2.3 2.1  PHOS  --   --   --  2.3* 1.9*    GFR: Estimated Creatinine Clearance: 49 mL/min (by C-G formula based on SCr of 0.85 mg/dL).  Liver Function Tests: Recent Labs  Lab 06/05/24 1241  AST 37  ALT 22  ALKPHOS 50  BILITOT  1.4*  PROT 7.9  ALBUMIN  3.9   Recent Labs  Lab 06/05/24 1241  LIPASE 18   No results for input(s): AMMONIA in the last 168 hours.  Coagulation Profile: Recent Labs  Lab 06/05/24 1241  INR 1.4*    Cardiac Enzymes: No results for input(s): CKTOTAL, CKMB, CKMBINDEX, TROPONINI in the last 168 hours.  BNP (last 3 results) No results for input(s): PROBNP in the last 8760 hours.  Lipid Profile: No results for input(s): CHOL, HDL, LDLCALC, TRIG, CHOLHDL, LDLDIRECT in the last 72 hours.  Thyroid  Function Tests: No results for input(s): TSH, T4TOTAL, FREET4, T3FREE, THYROIDAB in the last 72 hours.  Anemia Panel: No results for input(s): VITAMINB12, FOLATE, FERRITIN, TIBC, IRON , RETICCTPCT in the last 72 hours.  Urine analysis:    Component Value Date/Time   COLORURINE YELLOW 02/23/2022 1703   APPEARANCEUR CLEAR 02/23/2022 1703   LABSPEC 1.015 02/23/2022 1703   PHURINE 6.0 02/23/2022 1703   GLUCOSEU NEGATIVE 02/23/2022 1703   HGBUR NEGATIVE 02/23/2022 1703   BILIRUBINUR NEGATIVE 02/23/2022 1703   KETONESUR 5 (A) 02/23/2022 1703   PROTEINUR NEGATIVE 02/23/2022 1703   NITRITE NEGATIVE 02/23/2022 1703   LEUKOCYTESUR NEGATIVE 02/23/2022 1703    Sepsis Labs: Lactic Acid, Venous No results found for: LATICACIDVEN  MICROBIOLOGY: Recent Results (from the past 240 hours)  MRSA Next Gen by PCR, Nasal     Status: None   Collection Time: 06/06/24 12:45 AM   Specimen: Nasal Mucosa; Nasal Swab  Result Value Ref Range Status   MRSA by PCR Next Gen NOT DETECTED NOT DETECTED Final    Comment: (NOTE) The GeneXpert MRSA Assay (FDA approved for NASAL specimens only), is one component of a comprehensive MRSA colonization surveillance program. It is not intended to diagnose MRSA infection nor to guide or monitor treatment for MRSA infections. Test performance is not FDA approved in patients less than 68 years old. Performed at [facility], [address], [place], [place] [address]     RADIOLOGY STUDIES/RESULTS: DG Abd Portable 1V-Small Bowel Obstruction Protocol-initial, 8 hr delay Result Date: 06/07/2024 CLINICAL DATA:  Small bowel obstruction, 8 hour delay EXAM: PORTABLE ABDOMEN - 1 VIEW COMPARISON:  CT and plain films 06/05/2024 FINDINGS: NG tube is in the stomach. Oral contrast material seen within dilated small bowel loops. No contrast within the colon. Contrast seen in the bladder from prior CT. No organomegaly or free air. Prior right hip replacement. IMPRESSION: Oral contrast material within dilated small  bowel loops. No passage into colon as of yet. Electronically Signed   By: Franky Crease M.D.   On: 06/07/2024 00:47     LOS: 3 days   Donalda Applebaum, MD  [facility]    To contact the attending provider between 7A-7P or the covering provider during after hours 7P-7A, please log into the web site [email or web address] and access using universal [place] password for that web site. If you do not have the password, please call the hospital operator.  06/08/2024, 10:23 AM

## 2024-06-08 NOTE — Progress Notes (Signed)
   06/08/24 0837  Mobility  Activity Refused and notified nurse if applicable  Mobility Specialist Start Time (ACUTE ONLY) 0837  Mobility Specialist Stop Time (ACUTE ONLY) 0839  Mobility Specialist Time Calculation (min) (ACUTE ONLY) 2 min   Mobility Specialist: Progress Note  Pt refused mobility session d/t high HR, 110-120. Received and left in bed with all needs met. Call bell within reach. MS will follow up as time permits.   Virgle Boards, BS Mobility Specialist Please contact via SecureChat or Rehab office at [phone].

## 2024-06-08 NOTE — Plan of Care (Signed)
  Problem: Nutrition: Goal: Adequate nutrition will be maintained Outcome: Progressing   Problem: Coping: Goal: Level of anxiety will decrease Outcome: Progressing   Problem: Elimination: Goal: Will not experience complications related to bowel motility Outcome: Progressing   Problem: Pain Managment: Goal: General experience of comfort will improve and/or be controlled Outcome: Progressing   Problem: Safety: Goal: Ability to remain free from injury will improve Outcome: Progressing   Problem: Activity: Goal: Ability to tolerate increased activity will improve Outcome: Progressing

## 2024-06-08 NOTE — Progress Notes (Signed)
 PHARMACY - ANTICOAGULATION CONSULT NOTE  Pharmacy Consult for heparin  Indication: atrial fibrillation  No Known Allergies  Patient Measurements: Weight: 61.2 kg (135 lb)  Vital Signs: Temp: 98.6 F (37 C) (09/11 1149) Temp Source: Oral (09/11 1149) BP: 117/76 (09/11 1200) Pulse Rate: 117 (09/11 1200)  Labs: Recent Labs    06/06/24 0530 06/06/24 1335 06/07/24 0432 06/07/24 1445 06/08/24 0250  HGB 14.8  --  14.2  --  13.8  HCT 43.3  --  42.3  --  41.0  PLT 168  --  192  --  181  APTT 73* 59* 98* 94*  --   HEPARINUNFRC >1.10*  --  >1.10*  --   --   CREATININE 1.07  --  1.14  --  0.85    Estimated Creatinine Clearance: 49 mL/min (by C-G formula based on SCr of 0.85 mg/dL).   Medical History: Past Medical History:  Diagnosis Date   Astigmatism    Basal cell carcinoma    Inguinal hernia    bilateral   Macular degeneration, age related, nonexudative    bilateral   SVT (supraventricular tachycardia) (HCC)      Assessment: 89 yo M presenting with abdominal pain, constipation, in Afib RVR, hx of afib on Eliquis  PTA with last dose 9/8 AM, on hold for SBO eval.    Surgery on hold. OK to restart heparin  per Surgery and [facility]. Will resume at previous rate.   Goal of Therapy:  Heparin  level 0.3-0.7 units/ml aPTT 66-102 seconds Monitor platelets by anticoagulation protocol: Yes   Plan:  Restart heparin  at 1050 units/hr Heparin  level and APTT in 8 hours Heparin  level and APTT daily until correlating  Suhailah Kwan A. Lyle, PharmD, BCPS, FNKF Clinical Pharmacist [place] Please utilize Amion for appropriate phone number to reach the unit pharmacist [facility])

## 2024-06-08 NOTE — Plan of Care (Signed)

## 2024-06-08 NOTE — Progress Notes (Signed)
   Subjective/Chief Complaint: Patient denies abdominal No flatus yet   Objective: Vital signs in last 24 hours: Temp:  [97.9 F (36.6 C)-98.6 F (37 C)] 98.6 F (37 C) (09/11 0805) Pulse Rate:  [84-113] 113 (09/11 0805) Resp:  [17-21] 21 (09/11 0700) BP: (103-129)/(76-99) 122/79 (09/11 0805) SpO2:  [89 %-98 %] 89 % (09/11 0700) Last BM Date : 06/01/24  Intake/Output from previous day: 09/10 0701 - 09/11 0700 In: 3 [I.V.:3] Out: 600 [Urine:600] Intake/Output this shift: No intake/output data recorded.  Exam: Awake and alert Comfortable Abdomen soft, non-tender Ventral incisional hernia and left inguinal hernia are easily reducible  Lab Results:  Recent Labs    06/07/24 0432 06/08/24 0250  WBC 9.8 12.0*  HGB 14.2 13.8  HCT 42.3 41.0  PLT 192 181   BMET Recent Labs    06/07/24 0432 06/08/24 0250  NA 139 139  K 3.4* 3.7  CL 95* 97*  CO2 28 29  GLUCOSE 105* 101*  BUN 48* 28*  CREATININE 1.14 0.85  CALCIUM  9.4 9.0   PT/INR Recent Labs    06/05/24 1241  LABPROT 17.5*  INR 1.4*   ABG No results for input(s): PHART, HCO3 in the last 72 hours.  Invalid input(s): PCO2, PO2  Studies/Results: DG Abd Portable 1V-Small Bowel Obstruction Protocol-initial, 8 hr delay Result Date: 06/07/2024 CLINICAL DATA:  Small bowel obstruction, 8 hour delay EXAM: PORTABLE ABDOMEN - 1 VIEW COMPARISON:  CT and plain films 06/05/2024 FINDINGS: NG tube is in the stomach. Oral contrast material seen within dilated small bowel loops. No contrast within the colon. Contrast seen in the bladder from prior CT. No organomegaly or free air. Prior right hip replacement. IMPRESSION: Oral contrast material within dilated small bowel loops. No passage into colon as of yet. Electronically Signed   By: Franky Crease M.D.   On: 06/07/2024 00:47    Anti-infectives: Anti-infectives (From admission, onward)    None       Assessment/Plan: SBO  -films now show contrast in the  colon.  Given his exam, will hold on surgery.  Will clamp his NG and try clear liquids.  Will also give a suppository. -ok to resume heparin  but hold eliquis  in case surgery still needed  Moderate medical decision making   LOS: 3 days    Jusitn Salsgiver 06/08/2024

## 2024-06-09 ENCOUNTER — Inpatient Hospital Stay (HOSPITAL_COMMUNITY)

## 2024-06-09 DIAGNOSIS — N179 Acute kidney failure, unspecified: Secondary | ICD-10-CM | POA: Diagnosis not present

## 2024-06-09 DIAGNOSIS — I4891 Unspecified atrial fibrillation: Secondary | ICD-10-CM | POA: Diagnosis not present

## 2024-06-09 DIAGNOSIS — K56609 Unspecified intestinal obstruction, unspecified as to partial versus complete obstruction: Secondary | ICD-10-CM | POA: Diagnosis not present

## 2024-06-09 DIAGNOSIS — E785 Hyperlipidemia, unspecified: Secondary | ICD-10-CM | POA: Diagnosis not present

## 2024-06-09 LAB — MAGNESIUM: Magnesium: 1.9 mg/dL (ref 1.7–2.4)

## 2024-06-09 LAB — BASIC METABOLIC PANEL WITH GFR
Anion gap: 11 (ref 5–15)
BUN: 21 mg/dL (ref 8–23)
CO2: 26 mmol/L (ref 22–32)
Calcium: 8.9 mg/dL (ref 8.9–10.3)
Chloride: 100 mmol/L (ref 98–111)
Creatinine, Ser: 0.78 mg/dL (ref 0.61–1.24)
GFR, Estimated: >60 mL/min (ref >60)
Glucose, Bld: 111 mg/dL — ABNORMAL HIGH (ref 70–99)
Potassium: 3.7 mmol/L (ref 3.5–5.1)
Sodium: 137 mmol/L (ref 135–145)

## 2024-06-09 LAB — CBC
HCT: 41.9 % (ref 39.0–52.0)
Hemoglobin: 14.2 g/dL (ref 13.0–17.0)
MCH: 31.7 pg (ref 26.0–34.0)
MCHC: 33.9 g/dL (ref 30.0–36.0)
MCV: 93.5 fL (ref 80.0–100.0)
Platelets: 211 K/uL (ref 150–400)
RBC: 4.48 MIL/uL (ref 4.22–5.81)
RDW: 12.9 % (ref 11.5–15.5)
WBC: 12.2 K/uL — ABNORMAL HIGH (ref 4.0–10.5)
nRBC: 0 % (ref 0.0–0.2)

## 2024-06-09 LAB — PHOSPHORUS: Phosphorus: 2.3 mg/dL — ABNORMAL LOW (ref 2.5–4.6)

## 2024-06-09 LAB — APTT: aPTT: 90 s — ABNORMAL HIGH (ref 24–36)

## 2024-06-09 MED ORDER — METOPROLOL TARTRATE 25 MG PO TABS
25.0000 mg | ORAL_TABLET | Freq: Two times a day (BID) | ORAL | Status: DC
  Administered 2024-06-09: 25 mg via ORAL
  Filled 2024-06-09: qty 1

## 2024-06-09 MED ORDER — HEPARIN (PORCINE) 25000 UT/250ML-% IV SOLN
950.0000 [IU]/h | INTRAVENOUS | Status: DC
  Administered 2024-06-09: 1050 [IU]/h via INTRAVENOUS
  Administered 2024-06-10: 950 [IU]/h via INTRAVENOUS
  Filled 2024-06-09: qty 250

## 2024-06-09 MED ORDER — MAGNESIUM SULFATE IN D5W 1-5 GM/100ML-% IV SOLN
1.0000 g | Freq: Once | INTRAVENOUS | Status: AC
  Administered 2024-06-09: 1 g via INTRAVENOUS
  Filled 2024-06-09: qty 100

## 2024-06-09 MED ORDER — DEXTROSE 5 % IV SOLN
30.0000 mmol | Freq: Once | INTRAVENOUS | Status: AC
  Administered 2024-06-09: 30 mmol via INTRAVENOUS
  Filled 2024-06-09: qty 10

## 2024-06-09 NOTE — Progress Notes (Signed)
 Physical Therapy Treatment Patient Details Name: Gary Rice MRN: [id number] DOB: 03-Dec-1930 Today's Date: 06/09/2024   History of Present Illness Pt is a 89 y.o. M who presents 06/05/2024 with SBO. Significant PMH:  A-fib on Cardizem  and Eliquis , history of SVT, history of inguinal hernia, history of bowel resection in 2022, inguinal hernia repair 2022.    PT Comments  Focused session on gait training with a SPC and on stair training, but was limited by bowel incontinence. The pt is displaying deficits in balance, endurance, and strength, which places him at risk for falls. He needed minA to transfer to stand from a very low toilet but CGA to transfer to stand from low EOB with SPC. He was limited by his HR elevating and his SpO2 declining with activity, see General Comments below for vitals. He may benefit from a shower chair and rollator for improved balance and safety and for energy conservation at d/c. Will continue to follow acutely.    If plan is discharge home, recommend the following: Assistance with cooking/housework;Assist for transportation;Help with stairs or ramp for entrance   Can travel by private vehicle        Equipment Recommendations  Rollator (4 wheels);Other (comment) (shower chair)    Recommendations for Other Services       Precautions / Restrictions Precautions Precautions: Fall;Other (comment) Precaution/Restrictions Comments: watch HR & SpO2 Restrictions Weight Bearing Restrictions Per Provider Order: No     Mobility  Bed Mobility Overal bed mobility: Needs Assistance Bed Mobility: Supine to Sit     Supine to sit: Supervision, HOB elevated, Used rails     General bed mobility comments: Pt needing extra time and use of rails to sit up L EOB with supervision for safety    Transfers Overall transfer level: Needs assistance Equipment used: Straight cane (grab bar in bathroom) Transfers: Sit to/from Stand Sit to Stand: Contact guard assist, Min  assist           General transfer comment: Pt slow to power up to stand and gain balance each rep, x3 from EOB and x1 from low toilet. Pt used SPC in R hand with the x3 reps from EOB with CGA for safety. Pt used L grab bar to pull up to stand from low toilet with minA to power up to stand.    Ambulation/Gait Ambulation/Gait assistance: Contact guard assist Gait Distance (Feet): 20 Feet (x2 bouts of ~20 ft each rep) Assistive device: Straight cane Gait Pattern/deviations: Decreased step length - right, Decreased step length - left, Decreased dorsiflexion - right, Step-through pattern, Trunk flexed Gait velocity: decreased Gait velocity interpretation: <1.31 ft/sec, indicative of household ambulator   General Gait Details: Pt ambulated in the room with decreased feet clearance and with a flexed posture using a SPC in his R hand for stability and CGA for safety. Distance limited by bowel incontinence.   Stairs Stairs: Yes Stairs assistance: Contact guard assist Stair Management: One rail Right, One rail Left, Step to pattern, Forwards, With cane Number of Stairs: 3 General stair comments: Ascended and descended portable step x3 reps, x1 with sink on R for support and SPC in L hand and x2 with sink on L for support and SPC in R hand. Instability noted but pt able to maintain his own balance with CGA for safety. Intended to perform more reps but limited by bowel incontinence   Wheelchair Mobility     Tilt Bed    Modified Rankin (Stroke Patients Only)  Balance Overall balance assessment: Needs assistance Sitting-balance support: No upper extremity supported, Feet supported Sitting balance-Leahy Scale: Good Sitting balance - Comments: able to reach down to manage shoes with extra effort and time without LOB, supervision for safety; able to reach off COG to perform pericare without LOB   Standing balance support: No upper extremity supported, Single extremity  supported Standing balance-Leahy Scale: Fair Standing balance comment: Able to stand statically without UE support but reliant on SPC to ambulate                            Communication Communication Communication: No apparent difficulties  Cognition Arousal: Alert Behavior During Therapy: WFL for tasks assessed/performed   PT - Cognitive impairments: No apparent impairments                         Following commands: Intact      Cueing Cueing Techniques: Verbal cues, Tactile cues  Exercises      General Comments General comments (skin integrity, edema, etc.): HR 80s-90s at rest, up to 148 bpm with activity; SpO2 87% on RA at rest, 91% on 2L at rest, maintained pt on 2L O2 during mobility      Pertinent Vitals/Pain Pain Assessment Pain Assessment: Faces Faces Pain Scale: No hurt Pain Intervention(s): Monitored during session    Home Living                          Prior Function            PT Goals (current goals can now be found in the care plan section) Acute Rehab PT Goals Patient Stated Goal: get better PT Goal Formulation: With patient Time For Goal Achievement: 06/20/24 Potential to Achieve Goals: Good Progress towards PT goals: Progressing toward goals    Frequency    Min 2X/week      PT Plan      Co-evaluation              AM-PAC PT 6 Clicks Mobility   Outcome Measure  Help needed turning from your back to your side while in a flat bed without using bedrails?: None Help needed moving from lying on your back to sitting on the side of a flat bed without using bedrails?: A Little Help needed moving to and from a bed to a chair (including a wheelchair)?: A Little Help needed standing up from a chair using your arms (e.g., wheelchair or bedside chair)?: A Little Help needed to walk in hospital room?: A Little Help needed climbing 3-5 steps with a railing? : A Little 6 Click Score: 19    End of Session    Activity Tolerance: Patient limited by fatigue;Other (comment) (limited by bowel incontinence) Patient left: in chair;with call bell/phone within reach;with chair alarm set Nurse Communication: Mobility status;Other (comment) (bowel incontinence) PT Visit Diagnosis: Unsteadiness on feet (R26.81);Other abnormalities of gait and mobility (R26.89);Muscle weakness (generalized) (M62.81);Difficulty in walking, not elsewhere classified (R26.2)     Time: 8981-8891 PT Time Calculation (min) (ACUTE ONLY): 50 min  Charges:    $Gait Training: 8-22 mins $Therapeutic Activity: 23-37 mins PT General Charges $$ ACUTE PT VISIT: 1 Visit                     Theo Ferretti, PT, DPT Acute Rehabilitation Services  Office: [phone]    Theo CHRISTELLA Ferretti  06/09/2024, 4:09 PM

## 2024-06-09 NOTE — Progress Notes (Signed)
   Subjective/Chief Complaint: XR yesterday with contrast in colon. Patient has had multiple bowel movements. Reports a small episode of emesis this morning but feels better.   Objective: Vital signs in last 24 hours: Temp:  [97.9 F (36.6 C)-99 F (37.2 C)] 97.9 F (36.6 C) (09/12 0749) Pulse Rate:  [79-120] 87 (09/12 1000) Resp:  [14-28] 22 (09/12 1000) BP: (100-124)/(67-88) 123/78 (09/12 1000) SpO2:  [88 %-98 %] 90 % (09/12 1000) Last BM Date : 06/09/24  Intake/Output from previous day: 09/11 0701 - 09/12 0700 In: 363 [P.O.:360; I.V.:3] Out: 1400 [Urine:1400] Intake/Output this shift: No intake/output data recorded.  Exam: Awake and alert Abdomen soft, non-tender, nondistended   Lab Results:  Recent Labs    06/08/24 0250 06/09/24 0331  WBC 12.0* 12.2*  HGB 13.8 14.2  HCT 41.0 41.9  PLT 181 211   BMET Recent Labs    06/08/24 0250 06/09/24 0331  NA 139 137  K 3.7 3.7  CL 97* 100  CO2 29 26  GLUCOSE 101* 111*  BUN 28* 21  CREATININE 0.85 0.78  CALCIUM  9.0 8.9   PT/INR No results for input(s): LABPROT, INR in the last 72 hours.  ABG No results for input(s): PHART, HCO3 in the last 72 hours.  Invalid input(s): PCO2, PO2  Studies/Results: DG Abd Portable 1V Result Date: 06/08/2024 CLINICAL DATA:  89 year old male with small-bowel obstruction on CT 06/05/2024 related to ventral abdominal adhesions. EXAM: PORTABLE ABDOMEN - 1 VIEW COMPARISON:  Abdominal radiographs yesterday and earlier. FINDINGS: Portable AP supine views at 0722 hours. Stable enteric tube terminating in the stomach. Oral contrast now in nondilated right and transverse colon. But ongoing gas distended mid and lower abdominal small bowel loops. Gas pattern only mildly improved from the scout view 06/05/2024. Stable lung bases. Stable visualized osseous structures. Spine degeneration and scoliosis. Right bipolar hip arthroplasty. IMPRESSION: 1. Partial small bowel obstruction.  Oral contrast now in the colon but unresolved gas distended mid and lower abdominal small bowel loops, mildly improved since CT. 2. Stable enteric tube terminating in the proximal stomach. Electronically Signed   By: VEAR Hurst M.D.   On: 06/08/2024 10:31    Anti-infectives: Anti-infectives (From admission, onward)    None       Assessment/Plan: SBO  -films now show contrast in the colon. NG tube has been removed. -Advance to full liquids, if tolerating can advance to soft diet -Ok for heparin  gtt, please continue holding Eliquis  for now.  Moderate medical decision making   LOS: 4 days    Gary Rice Dawn 06/09/2024

## 2024-06-09 NOTE — Progress Notes (Signed)
 PROGRESS NOTE        PATIENT DETAILS Name: Gary Rice Age: 89 y.o. Sex: male Date of Birth: 1931/08/04 Admit Date: 06/05/2024 Admitting Physician Delon Hoe, DO ERE:Fnmmnt, Beverley, MD  Brief Summary: Patient is a 89 y.o.  male with history of A-fib-presented with SBO and A-fib RVR.  Significant events: 9/8>> admit to [facility]  Significant studies: 9/8>> CT abdomen/pelvis: High-grade SBO. 9/9>> SBO protocol x-ray: No passage of contrast into colon yet.  Significant microbiology data: None  Procedures: None  Consults: None  Subjective: Had BM overnight-tolerating clear liquids-NG tube has been removed overnight.  Objective: Vitals: Blood pressure 100/76, pulse 91, temperature 97.9 F (36.6 C), temperature source Oral, resp. rate 20, weight 61.2 kg, SpO2 92%.   Exam: Gen Exam:Alert awake-not in any distress HEENT:atraumatic, normocephalic Chest: B/L clear to auscultation anteriorly CVS:S1S2 regular Abdomen:soft non tender, non distended Extremities:no edema Neurology: Non focal Skin: no rash  Pertinent Labs/Radiology:    Latest Ref Rng & Units 06/09/2024    3:31 AM 06/08/2024    2:50 AM 06/07/2024    4:32 AM  CBC  WBC 4.0 - 10.5 K/uL 12.2  12.0  9.8   Hemoglobin 13.0 - 17.0 g/dL 85.7  86.1  85.7   Hematocrit 39.0 - 52.0 % 41.9  41.0  42.3   Platelets 150 - 400 K/uL 211  181  192     Lab Results  Component Value Date   NA 137 06/09/2024   K 3.7 06/09/2024   CL 100 06/09/2024   CO2 26 06/09/2024     Assessment/Plan: SBO Thankfully improved with supportive care No longer with NG tube Tolerating clear liquids Had BM overnight Await further recommendations from general surgery.   A-fib RVR Rate controlled Start oral beta-blocker Attempt to taper Cardizem  infusion Continue heparin  drip till diet stabilizes further.  AKI Hemodynamically mediated Resolved.  Hypokalemia Likely secondary to NGT  suctioning. Repleted.  HLD Hold statins for now-resume when oral intake stable.  Code status:   Code Status: Full Code   DVT Prophylaxis: IV heparin   Family Communication: Spouse-[phone] updated 9/11   Disposition Plan: Status is: Inpatient Remains inpatient appropriate because: Severity of illness   Planned Discharge Destination:Home health   Diet: Diet Order             Diet clear liquid Room service appropriate? Yes; Fluid consistency: Thin  Diet effective now                     Antimicrobial agents: Anti-infectives (From admission, onward)    None        MEDICATIONS: Scheduled Meds:  Influenza vac split trivalent PF  0.5 mL Intramuscular Tomorrow-1000   ipratropium  2 spray Each Nare BID   metoprolol  tartrate  25 mg Oral BID   pantoprazole  (PROTONIX ) IV  40 mg Intravenous Daily   sodium chloride  flush  3 mL Intravenous Q12H   Continuous Infusions:  diltiazem  (CARDIZEM ) infusion 15 mg/hr (06/08/24 1350)   heparin  1,050 Units/hr (06/09/24 0822)   potassium PHOSPHATE  IVPB (in mmol) 30 mmol (06/09/24 0818)   PRN Meds:.HYDROmorphone  (DILAUDID ) injection, ondansetron  **OR** ondansetron  (ZOFRAN ) IV   I have personally reviewed following labs and imaging studies  LABORATORY DATA: CBC: Recent Labs  Lab 06/05/24 1241 06/06/24 0530 06/07/24 0432 06/08/24 0250 06/09/24 0331  WBC 8.7 7.3 9.8 12.0*  12.2*  NEUTROABS 6.1  --   --   --   --   HGB 16.0 14.8 14.2 13.8 14.2  HCT 45.8 43.3 42.3 41.0 41.9  MCV 91.1 92.9 93.4 94.0 93.5  PLT 238 168 192 181 211    Basic Metabolic Panel: Recent Labs  Lab 06/05/24 1241 06/06/24 0529 06/06/24 0530 06/07/24 0432 06/08/24 0250 06/09/24 0331  NA 132*  --  138 139 139 137  K 3.9  --  3.4* 3.4* 3.7 3.7  CL 87*  --  94* 95* 97* 100  CO2 25  --  30 28 29 26   GLUCOSE 134*  --  104* 105* 101* 111*  BUN 69*  --  48* 48* 28* 21  CREATININE 1.81*  --  1.07 1.14 0.85 0.78  CALCIUM  10.0  --  9.1 9.4  9.0 8.9  MG  --  2.1  --  2.3 2.1 1.9  PHOS  --   --   --  2.3* 1.9* 2.3*    GFR: Estimated Creatinine Clearance: 52.1 mL/min (by C-G formula based on SCr of 0.78 mg/dL).  Liver Function Tests: Recent Labs  Lab 06/05/24 1241  AST 37  ALT 22  ALKPHOS 50  BILITOT 1.4*  PROT 7.9  ALBUMIN  3.9   Recent Labs  Lab 06/05/24 1241  LIPASE 18   No results for input(s): AMMONIA in the last 168 hours.  Coagulation Profile: Recent Labs  Lab 06/05/24 1241  INR 1.4*    Cardiac Enzymes: No results for input(s): CKTOTAL, CKMB, CKMBINDEX, TROPONINI in the last 168 hours.  BNP (last 3 results) No results for input(s): PROBNP in the last 8760 hours.  Lipid Profile: No results for input(s): CHOL, HDL, LDLCALC, TRIG, CHOLHDL, LDLDIRECT in the last 72 hours.  Thyroid  Function Tests: No results for input(s): TSH, T4TOTAL, FREET4, T3FREE, THYROIDAB in the last 72 hours.  Anemia Panel: No results for input(s): VITAMINB12, FOLATE, FERRITIN, TIBC, IRON , RETICCTPCT in the last 72 hours.  Urine analysis:    Component Value Date/Time   COLORURINE YELLOW 02/23/2022 1703   APPEARANCEUR CLEAR 02/23/2022 1703   LABSPEC 1.015 02/23/2022 1703   PHURINE 6.0 02/23/2022 1703   GLUCOSEU NEGATIVE 02/23/2022 1703   HGBUR NEGATIVE 02/23/2022 1703   BILIRUBINUR NEGATIVE 02/23/2022 1703   KETONESUR 5 (A) 02/23/2022 1703   PROTEINUR NEGATIVE 02/23/2022 1703   NITRITE NEGATIVE 02/23/2022 1703   LEUKOCYTESUR NEGATIVE 02/23/2022 1703    Sepsis Labs: Lactic Acid, Venous No results found for: LATICACIDVEN  MICROBIOLOGY: Recent Results (from the past 240 hours)  MRSA Next Gen by PCR, Nasal     Status: None   Collection Time: 06/06/24 12:45 AM   Specimen: Nasal Mucosa; Nasal Swab  Result Value Ref Range Status   MRSA by PCR Next Gen NOT DETECTED NOT DETECTED Final    Comment: (NOTE) The GeneXpert MRSA Assay (FDA approved for NASAL specimens  only), is one component of a comprehensive MRSA colonization surveillance program. It is not intended to diagnose MRSA infection nor to guide or monitor treatment for MRSA infections. Test performance is not FDA approved in patients less than 65 years old. Performed at [facility], [address] [place], [place] [address]     RADIOLOGY STUDIES/RESULTS: DG Abd Portable 1V Result Date: 06/08/2024 CLINICAL DATA:  89 year old male with small-bowel obstruction on CT 06/05/2024 related to ventral abdominal adhesions. EXAM: PORTABLE ABDOMEN - 1 VIEW COMPARISON:  Abdominal radiographs yesterday and earlier. FINDINGS: Portable AP supine views  at 0722 hours. Stable enteric tube terminating in the stomach. Oral contrast now in nondilated right and transverse colon. But ongoing gas distended mid and lower abdominal small bowel loops. Gas pattern only mildly improved from the scout view 06/05/2024. Stable lung bases. Stable visualized osseous structures. Spine degeneration and scoliosis. Right bipolar hip arthroplasty. IMPRESSION: 1. Partial small bowel obstruction. Oral contrast now in the colon but unresolved gas distended mid and lower abdominal small bowel loops, mildly improved since CT. 2. Stable enteric tube terminating in the proximal stomach. Electronically Signed   By: VEAR Hurst M.D.   On: 06/08/2024 10:31     LOS: 4 days   Donalda Applebaum, MD  [facility]    To contact the attending provider between 7A-7P or the covering provider during after hours 7P-7A, please log into the web site [email or web address] and access using universal [place] password for that web site. If you do not have the password, please call the hospital operator.  06/09/2024, 10:00 AM

## 2024-06-09 NOTE — Plan of Care (Signed)

## 2024-06-09 NOTE — Progress Notes (Signed)
 Called [facility] regarding STAT order to confirm they received it. They reported that they would be right up.

## 2024-06-09 NOTE — Progress Notes (Signed)
 PHARMACY - ANTICOAGULATION CONSULT NOTE  Pharmacy Consult for heparin  Indication: atrial fibrillation  No Known Allergies  Patient Measurements: Weight: 61.2 kg (135 lb)  Vital Signs: Temp: 97.9 F (36.6 C) (09/12 0749) Temp Source: Oral (09/12 0749) BP: 100/76 (09/12 0749) Pulse Rate: 91 (09/12 0749)  Labs: Recent Labs    06/07/24 0432 06/07/24 1445 06/08/24 0250 06/08/24 2130 06/09/24 0331 06/09/24 0914  HGB 14.2  --  13.8  --  14.2  --   HCT 42.3  --  41.0  --  41.9  --   PLT 192  --  181  --  211  --   APTT 98* 94*  --  77*  --  90*  HEPARINUNFRC >1.10*  --   --  0.94*  --   --   CREATININE 1.14  --  0.85  --  0.78  --     Estimated Creatinine Clearance: 52.1 mL/min (by C-G formula based on SCr of 0.78 mg/dL).   Medical History: Past Medical History:  Diagnosis Date   Astigmatism    Basal cell carcinoma    Inguinal hernia    bilateral   Macular degeneration, age related, nonexudative    bilateral   SVT (supraventricular tachycardia) (HCC)      Assessment: 89 yo M presenting with abdominal pain, constipation, in Afib RVR, hx of afib on Eliquis  PTA with last dose 9/8 AM, on hold for SBO eval.    IV heparin   -aPTT at goal (90 secs) on 1050 units/hr  Goal of Therapy:  Heparin  level 0.3-0.7 units/ml aPTT 66-102 seconds Monitor platelets by anticoagulation protocol: Yes   Plan:  Continue heparin  at 1050 units/hr Daily heparin  level and aPTT until they correlate and CBC  Donny Alert, PharmD, [facility] Clinical Pharmacist Please see AMION for all Pharmacists' Contact Phone Numbers 06/09/2024, 9:57 AM

## 2024-06-09 NOTE — Plan of Care (Signed)
  Problem: Education: Goal: Knowledge of General Education information will improve Description: Including pain rating scale, medication(s)/side effects and non-pharmacologic comfort measures Outcome: Progressing   Problem: Health Behavior/Discharge Planning: Goal: Ability to manage health-related needs will improve Outcome: Progressing   Problem: Clinical Measurements: Goal: Ability to maintain clinical measurements within normal limits will improve Outcome: Progressing Goal: Will remain free from infection Outcome: Progressing   Problem: Activity: Goal: Risk for activity intolerance will decrease Outcome: Progressing   Problem: Nutrition: Goal: Adequate nutrition will be maintained Outcome: Progressing   Problem: Coping: Goal: Level of anxiety will decrease Outcome: Progressing   Problem: Elimination: Goal: Will not experience complications related to bowel motility Outcome: Progressing   Problem: Education: Goal: Knowledge of disease or condition will improve Outcome: Progressing Goal: Understanding of medication regimen will improve Outcome: Progressing   Problem: Activity: Goal: Ability to tolerate increased activity will improve Outcome: Progressing   Problem: Cardiac: Goal: Ability to achieve and maintain adequate cardiopulmonary perfusion will improve Outcome: Progressing   Problem: Health Behavior/Discharge Planning: Goal: Ability to safely manage health-related needs after discharge will improve Outcome: Progressing

## 2024-06-09 NOTE — Care Management Important Message (Signed)
 Important Message  Patient Details  Name: Gary Rice MRN: [id number] Date of Birth: [birth year]   Important Message Given:        Claretta Deed 06/09/2024, 8:14 AM

## 2024-06-09 NOTE — Progress Notes (Signed)
 OT Cancellation Note  Patient Details Name: Raynor Calcaterra MRN: [id number] DOB: 08-25-1931   Cancelled Treatment:    Reason Eval/Treat Not Completed: Fatigue/lethargy limiting ability to participate. Pt declining OT eval at this time reporting fatigue and nausea after eating. OT will follow up as able to.  Carolyna Yerian C, OT  Acute Rehabilitation Services Office [phone] Secure chat preferred   Adrianne GORMAN Savers 06/09/2024, 3:40 PM

## 2024-06-10 ENCOUNTER — Inpatient Hospital Stay (HOSPITAL_COMMUNITY)

## 2024-06-10 DIAGNOSIS — I4891 Unspecified atrial fibrillation: Secondary | ICD-10-CM | POA: Diagnosis not present

## 2024-06-10 LAB — CBC
HCT: 38.5 % — ABNORMAL LOW (ref 39.0–52.0)
HCT: 42.4 % (ref 39.0–52.0)
Hemoglobin: 13.2 g/dL (ref 13.0–17.0)
Hemoglobin: 14.1 g/dL (ref 13.0–17.0)
MCH: 31.9 pg (ref 26.0–34.0)
MCH: 31.2 pg (ref 26.0–34.0)
MCHC: 34.3 g/dL (ref 30.0–36.0)
MCHC: 33.3 g/dL (ref 30.0–36.0)
MCV: 93 fL (ref 80.0–100.0)
MCV: 93.8 fL (ref 80.0–100.0)
Platelets: 192 K/uL (ref 150–400)
Platelets: 231 K/uL (ref 150–400)
RBC: 4.14 MIL/uL — ABNORMAL LOW (ref 4.22–5.81)
RBC: 4.52 MIL/uL (ref 4.22–5.81)
RDW: 12.9 % (ref 11.5–15.5)
RDW: 12.8 % (ref 11.5–15.5)
WBC: 11 K/uL — ABNORMAL HIGH (ref 4.0–10.5)
WBC: 14.1 K/uL — ABNORMAL HIGH (ref 4.0–10.5)
nRBC: 0 % (ref 0.0–0.2)
nRBC: 0 % (ref 0.0–0.2)

## 2024-06-10 LAB — BLOOD GAS, VENOUS
Acid-Base Excess: 7 mmol/L — ABNORMAL HIGH (ref 0.0–2.0)
Bicarbonate: 31.3 mmol/L — ABNORMAL HIGH (ref 20.0–28.0)
O2 Saturation: 88.7 %
Patient temperature: 37.2
pCO2, Ven: 42 mmHg — ABNORMAL LOW (ref 44–60)
pH, Ven: 7.48 — ABNORMAL HIGH (ref 7.25–7.43)
pO2, Ven: 57 mmHg — ABNORMAL HIGH (ref 32–45)

## 2024-06-10 LAB — APTT: aPTT: 107 s — ABNORMAL HIGH (ref 24–36)

## 2024-06-10 LAB — BASIC METABOLIC PANEL WITH GFR
Anion gap: 11 (ref 5–15)
Anion gap: 14 (ref 5–15)
BUN: 25 mg/dL — ABNORMAL HIGH (ref 8–23)
BUN: 26 mg/dL — ABNORMAL HIGH (ref 8–23)
CO2: 27 mmol/L (ref 22–32)
CO2: 22 mmol/L (ref 22–32)
Calcium: 8.8 mg/dL — ABNORMAL LOW (ref 8.9–10.3)
Calcium: 8.8 mg/dL — ABNORMAL LOW (ref 8.9–10.3)
Chloride: 97 mmol/L — ABNORMAL LOW (ref 98–111)
Chloride: 96 mmol/L — ABNORMAL LOW (ref 98–111)
Creatinine, Ser: 0.8 mg/dL (ref 0.61–1.24)
Creatinine, Ser: 0.8 mg/dL (ref 0.61–1.24)
GFR, Estimated: >60 mL/min (ref >60)
GFR, Estimated: >60 mL/min (ref >60)
Glucose, Bld: 114 mg/dL — ABNORMAL HIGH (ref 70–99)
Glucose, Bld: 126 mg/dL — ABNORMAL HIGH (ref 70–99)
Potassium: 3.9 mmol/L (ref 3.5–5.1)
Potassium: 3.9 mmol/L (ref 3.5–5.1)
Sodium: 135 mmol/L (ref 135–145)
Sodium: 132 mmol/L — ABNORMAL LOW (ref 135–145)

## 2024-06-10 LAB — ECHOCARDIOGRAM COMPLETE
AR max vel: 2.01 cm2
AV Peak grad: 5.8 mmHg
Ao pk vel: 1.2 m/s
Area-P 1/2: 4.15 cm2
S' Lateral: 1.4 cm
Weight: 2160 [oz_av]

## 2024-06-10 LAB — MAGNESIUM
Magnesium: 1.9 mg/dL (ref 1.7–2.4)
Magnesium: 1.8 mg/dL (ref 1.7–2.4)

## 2024-06-10 LAB — HEPARIN LEVEL (UNFRACTIONATED)
Heparin Unfractionated: 0.85 [IU]/mL — ABNORMAL HIGH (ref 0.30–0.70)
Heparin Unfractionated: 0.64 [IU]/mL (ref 0.30–0.70)

## 2024-06-10 LAB — PHOSPHORUS: Phosphorus: 2.9 mg/dL (ref 2.5–4.6)

## 2024-06-10 MED ORDER — MIDODRINE HCL 5 MG PO TABS: 10.0000 mg | ORAL_TABLET | Freq: Once | ORAL | Status: DC

## 2024-06-10 MED ORDER — MIDODRINE HCL 5 MG PO TABS: 10.0000 mg | ORAL_TABLET | Freq: Three times a day (TID) | ORAL | Status: DC

## 2024-06-10 MED ORDER — ALUM & MAG HYDROXIDE-SIMETH 200-200-20 MG/5ML PO SUSP
30.0000 mL | Freq: Four times a day (QID) | ORAL | Status: DC | PRN
  Administered 2024-06-10: 30 mL via ORAL
  Filled 2024-06-10: qty 30

## 2024-06-10 MED ORDER — AMIODARONE LOAD VIA INFUSION
150.0000 mg | INTRAVENOUS | Status: AC
  Administered 2024-06-10: 150 mg via INTRAVENOUS
  Filled 2024-06-10: qty 83.34

## 2024-06-10 MED ORDER — MIDODRINE HCL 5 MG PO TABS
10.0000 mg | ORAL_TABLET | Freq: Three times a day (TID) | ORAL | Status: DC
  Administered 2024-06-10: 10 mg via ORAL
  Filled 2024-06-10: qty 2

## 2024-06-10 MED ORDER — ALBUMIN HUMAN 25 % IV SOLN
25.0000 g | INTRAVENOUS | Status: AC
  Administered 2024-06-10: 25 g via INTRAVENOUS
  Filled 2024-06-10: qty 100

## 2024-06-10 MED ORDER — FUROSEMIDE 10 MG/ML IJ SOLN
20.0000 mg | INTRAMUSCULAR | Status: AC
  Administered 2024-06-10: 20 mg via INTRAVENOUS
  Filled 2024-06-10: qty 2

## 2024-06-10 MED ORDER — AMIODARONE HCL IN DEXTROSE 360-4.14 MG/200ML-% IV SOLN
30.0000 mg/h | INTRAVENOUS | Status: DC
Start: 2024-06-11 — End: 2024-06-11
  Administered 2024-06-11 (×2): 30 mg/h via INTRAVENOUS
  Filled 2024-06-10 (×2): qty 200

## 2024-06-10 MED ORDER — BENZOCAINE-MENTHOL 6-10 MG MT LOZG: 1.0000 | LOZENGE | OROMUCOSAL | Status: DC | PRN

## 2024-06-10 MED ORDER — LEVALBUTEROL HCL 0.63 MG/3ML IN NEBU
0.6300 mg | INHALATION_SOLUTION | Freq: Four times a day (QID) | RESPIRATORY_TRACT | Status: DC | PRN
  Administered 2024-06-10: 0.63 mg via RESPIRATORY_TRACT
  Filled 2024-06-10: qty 3

## 2024-06-10 MED ORDER — MIDODRINE HCL 5 MG PO TABS
5.0000 mg | ORAL_TABLET | Freq: Three times a day (TID) | ORAL | Status: DC
Start: 2024-06-10 — End: 2024-06-10
  Administered 2024-06-10 (×2): 5 mg via ORAL
  Filled 2024-06-10 (×2): qty 1

## 2024-06-10 MED ORDER — MENTHOL 3 MG MT LOZG
1.0000 | LOZENGE | OROMUCOSAL | Status: DC | PRN
  Filled 2024-06-10: qty 9

## 2024-06-10 MED ORDER — PROCHLORPERAZINE EDISYLATE 10 MG/2ML IJ SOLN
5.0000 mg | Freq: Once | INTRAMUSCULAR | Status: AC
  Administered 2024-06-10: 5 mg via INTRAVENOUS
  Filled 2024-06-10: qty 2

## 2024-06-10 MED ORDER — LACTATED RINGERS IV BOLUS
1000.0000 mL | Freq: Once | INTRAVENOUS | Status: AC
  Administered 2024-06-10: 1000 mL via INTRAVENOUS

## 2024-06-10 MED ORDER — METOPROLOL TARTRATE 25 MG PO TABS
25.0000 mg | ORAL_TABLET | Freq: Two times a day (BID) | ORAL | Status: DC
Start: 2024-06-10 — End: 2024-06-11
  Administered 2024-06-10 (×2): 25 mg via ORAL
  Filled 2024-06-10 (×3): qty 1

## 2024-06-10 MED ORDER — LACTATED RINGERS IV BOLUS
1000.0000 mL | INTRAVENOUS | Status: AC
  Administered 2024-06-10: 1000 mL via INTRAVENOUS

## 2024-06-10 MED ORDER — AMIODARONE HCL IN DEXTROSE 360-4.14 MG/200ML-% IV SOLN
60.0000 mg/h | INTRAVENOUS | Status: AC
  Administered 2024-06-11 (×2): 60 mg/h via INTRAVENOUS
  Filled 2024-06-10 (×2): qty 200

## 2024-06-10 NOTE — Plan of Care (Signed)
 A-fib RVR RN reported that patient heart rate in between 120-140 range.  Recommended to go up to Cardizem  drip currently on 5 mg to 7.5 or 10 mg/HR.  Patient also missed last night dose of Lopressor  25 mg recommended to give Lopressor  as well.  Given blood pressure is borderline soft giving 1 L of LR bolus and 25 g of albumin .   Gary Pinch, MD [facility] 06/10/2024, 1:36 AM

## 2024-06-10 NOTE — Progress Notes (Signed)
 PHARMACY - ANTICOAGULATION CONSULT NOTE  Pharmacy Consult for heparin  Indication: atrial fibrillation  No Known Allergies  Patient Measurements: Weight: 61.2 kg (135 lb)  Vital Signs: Temp: 98.9 F (37.2 C) (09/13 0300) Temp Source: Oral (09/13 0500) BP: 104/67 (09/13 0600) Pulse Rate: 88 (09/13 0500)  Labs: Recent Labs    06/08/24 0250 06/08/24 2130 06/09/24 0331 06/09/24 0914 06/10/24 0349  HGB 13.8  --  14.2  --  13.2  HCT 41.0  --  41.9  --  38.5*  PLT 181  --  211  --  192  APTT  --  77*  --  90* 107*  HEPARINUNFRC  --  0.94*  --   --  0.85*  CREATININE 0.85  --  0.78  --  0.80    Estimated Creatinine Clearance: 52.1 mL/min (by C-G formula based on SCr of 0.8 mg/dL).   Medical History: Past Medical History:  Diagnosis Date   Astigmatism    Basal cell carcinoma    Inguinal hernia    bilateral   Macular degeneration, age related, nonexudative    bilateral   SVT (supraventricular tachycardia) (HCC)      Assessment: 89 yo M presenting with abdominal pain, constipation, in Afib RVR, hx of afib on Eliquis  PTA with last dose 9/8 AM, on hold for SBO eval.  Pharmacy is consulted for heparin  dosing.   9/13 AM update:  -aPTT above goal at 107 on 1050 units/hr -Heparin  level above goal at 0.85 on 1050 units/hr -Hgb and PLT WNL -No signs of bleeding or pauses in infusion per RN  Goal of Therapy:  Heparin  level 0.3-0.7 units/ml aPTT 66-102 seconds Monitor platelets by anticoagulation protocol: Yes   Plan:  -Decrease heparin  infusion to 950 units/hr -Monitor heparin  level in 8 hours -Will stop monitoring aPTT levels as they are now correlated -Monitor Hgb, PLT  Izetta Carl, PharmD PGY1 Pharmacy Resident [facility]  06/10/2024 7:28 AM

## 2024-06-10 NOTE — Plan of Care (Addendum)
 Patient continues to have A-fib RVR increase Cardizem  to 10 mg/h however patient is also hypotensive at the same time give 1 L of LR bolus albumin  and midodrine .  After the LR bolus patient started developing shortness of breath and oxygen demand increased from 2 L to 12 L high flow maintaining 86%.     -Patient continues to have persistent hypotension and unable to increase the Cardizem  drip. Patient denies any chest pain and shortness of breath physical exam revealed diffuse rhonchi.  Gave Lasix  20 mg. - Chest x-ray no evidence of pulmonary vascular congestion.  Chest x-ray showing gaseous distention of stomach pushing the left sided lung. - Given patient was feeling nauseated earlier required Zofran  and Compazine  which improved the nausea patient also has 1 episode of vomiting.   He was initially admitted for small bowel obstruction required NG tube.  NG tube was removed earlier today and patient has been transition to clear liquid diet.  After having the food and the evening patient started nausea vomiting. -Obtaining x-ray of the abdomen and once patient will be stable will get CT abdomen.  -For persistent hypoxia starting heated high flow 60 L/min with FiO2 70%. -For management of A-fib -Continue Cardizem  drip 10 mg/h.  Currently on heparin  drip as well.  Update, x-ray abdomen showed distended stomach, persistent small bowel dilation consistent with obstruction.  Keeping patient completely NPO.  Inform general surgery.  Carlee Vonderhaar, MD [facility] 06/11/2024, 11pm    A-fib RVR -Maxed on Cardizem  drip 15 mg however patient is still in A-fib RVR heart rate up to 150s range.  Giving amiodarone  bolus 150 mg followed by continue amiodarone  drip and stopping the Cardizem  drip.  Persistent small bowel obstruction - X-ray x-ray abdomen showing persistent small bowel obstruction placing the NG tube with continued low intermittent suction.  Keep the patient NPO.   -Update, our rapid  response nurse practitioner Mindy Hopper did bedside NG tube drainage around 1800 drainage output through NG tube. -Gastric content is muddy brown/Ramser-colored unsure if patient is having occult GI bleed or not holding the heparin  drip.  However CBC showing stable 14.1 and 42.  Once the gastric contents started clearing up we will resume the heparin  drip again.  Also obtaining FOBT if it is negative can start a heparin  drip immediately.  Update, occult blood of the gastric content is positive..  Hold heparin  drip as of now.  If hemoglobin remained stable in the morning  will start the heparin  drip again  Inform general surgery Dr. Paola will evaluate patient.  Acute hypoxic respiratory failure - Patient O2 sat dropped to 84% to 88% around 10 PM currently on heated high flow oxygen O2 sat around 89%.  Given patient has nausea vomiting unable to place BiPAP. - Chest x-ray showing distended stomach that pushing the left-sided lung halfway through which is causing the hypoxic respiratory failure.   -Placing NG tube to decompress the stomach.     CRITICAL CARE Performed by: Grissel Tyrell, MD     Total critical care time: 35 minutes   Critical care time was exclusive of separately billable procedures and treating other patients.   Critical care was necessary to treat or prevent imminent or life-threatening deterioration.   Critical care was time spent personally by me on the following activities: development of treatment plan with patient and/or surrogate as well as nursing, discussions with consultants, evaluation of patient's response to treatment, examination of patient, obtaining history from patient or surrogate, ordering and  performing treatments and interventions, ordering and review of laboratory studies, ordering and review of radiographic studies, pulse oximetry and re-evaluation of patient's condition.

## 2024-06-10 NOTE — Progress Notes (Signed)
 PHARMACY - ANTICOAGULATION CONSULT NOTE  Pharmacy Consult for heparin  Indication: atrial fibrillation  No Known Allergies  Patient Measurements: Weight: 61.2 kg (135 lb)  Vital Signs: Temp: 98.3 F (36.8 C) (09/13 1500) Temp Source: Oral (09/13 1500) BP: 119/77 (09/13 1800) Pulse Rate: 111 (09/13 1800)  Labs: Recent Labs    06/08/24 0250 06/08/24 2130 06/09/24 0331 06/09/24 0914 06/10/24 0349 06/10/24 1723  HGB 13.8  --  14.2  --  13.2  --   HCT 41.0  --  41.9  --  38.5*  --   PLT 181  --  211  --  192  --   APTT  --  77*  --  90* 107*  --   HEPARINUNFRC  --  0.94*  --   --  0.85* 0.64  CREATININE 0.85  --  0.78  --  0.80  --     Estimated Creatinine Clearance: 52.1 mL/min (by C-G formula based on SCr of 0.8 mg/dL).   Medical History: Past Medical History:  Diagnosis Date   Astigmatism    Basal cell carcinoma    Inguinal hernia    bilateral   Macular degeneration, age related, nonexudative    bilateral   SVT (supraventricular tachycardia) (HCC)      Assessment: 89 yo M presenting with abdominal pain, constipation, in Afib RVR, hx of afib on Eliquis  PTA with last dose 9/8 AM, on hold for SBO eval.  Pharmacy is consulted for heparin  dosing.   9/13 AM update:  -aPTT above goal at 107 on 1050 units/hr -Heparin  level above goal at 0.85 on 1050 units/hr -Hgb and PLT WNL -No signs of bleeding or pauses in infusion per RN  9/14 PM update: HL 0.64 on 950 units/hr. No issues with the infusion or bleeding reported.  Goal of Therapy:  Heparin  level 0.3-0.7 units/ml aPTT 66-102 seconds Monitor platelets by anticoagulation protocol: Yes   Plan:  -Continue heparin  infusion at 950 units/hr -Check daily anti-Xa level while on heparin  -Monitor Hgb, PLT  Rocky Slade, PharmD, BCPS 06/10/2024 7:03 PM  Please check AMION for all [facility] phone numbers After 10:00 PM, call Main Pharmacy [phone]

## 2024-06-10 NOTE — Progress Notes (Signed)
   06/10/24 0100  Assess: MEWS Score  Temp 98.4 F (36.9 C)  BP 128/77  MAP (mmHg) 91  Pulse Rate (!) 118  ECG Heart Rate (!) 116  Resp 17  SpO2 93 %  O2 Device Nasal Cannula  O2 Flow Rate (L/min) 2 L/min  Assess: MEWS Score  MEWS Temp 0  MEWS Systolic 0  MEWS Pulse 2  MEWS RR 0  MEWS LOC 0  MEWS Score 2  MEWS Score Color Yellow  Assess: if the MEWS score is Yellow or Red  Were vital signs accurate and taken at a resting state? Yes  Does the patient meet 2 or more of the SIRS criteria? No  Does the patient have a confirmed or suspected source of infection? No  MEWS guidelines implemented  Yes, yellow  Treat  MEWS Interventions Considered administering scheduled or prn medications/treatments as ordered  Take Vital Signs  Increase Vital Sign Frequency  Yellow: Q2hr x1, continue Q4hrs until patient remains green for 12hrs  Escalate  MEWS: Escalate Yellow: Discuss with charge nurse and consider notifying provider and/or RRT  Notify: Charge Nurse/RN  Name of Charge Nurse/RN Notified [facility]  Provider Notification  Provider Name/Title MD Sundil  Date Provider Notified 06/10/24  Time Provider Notified 0105  Method of Notification Page (secure chat)  Notification Reason Other (Comment) (HR 115-115.)  Provider response See new orders  Date of Provider Response 06/10/24  Time of Provider Response 0115  Assess: SIRS CRITERIA  SIRS Temperature  0  SIRS Respirations  0  SIRS Pulse 1  SIRS WBC 0  SIRS Score Sum  1   Increased the cardizem  drip to 7.5ml/dl

## 2024-06-10 NOTE — Plan of Care (Addendum)
 Ongoing atrial fibrillation heart rate in between 120-125 per RN at bedside however patient blood pressure is soft 98/79.  Currently on Cardizem  drip 7.5 mg/h.  Unable to give metoprolol  tonight in the setting of low blood pressure.  Giving 1 L of LR bolus, albumin  25 g and increasing midodrine  5 to 10 mg starting from tonight.  Inform RN once blood pressure will be improved systolic above 110 can give the oral Lopressor .     Christie Viscomi, MD [facility] 06/10/2024, 9:28 PM

## 2024-06-10 NOTE — Progress Notes (Signed)
      Chief Complaint/Subjective: Tolerated grits this morning, +BMs, no abdominal pain  Objective: Vital signs in last 24 hours: Temp:  [97.6 F (36.4 C)-98.9 F (37.2 C)] 98.1 F (36.7 C) (09/13 0733) Pulse Rate:  [81-127] 103 (09/13 1000) Resp:  [16-24] 22 (09/13 1000) BP: (96-128)/(64-98) 106/72 (09/13 1000) SpO2:  [88 %-94 %] 93 % (09/13 1000) Last BM Date : 06/09/24 Intake/Output from previous day: 09/12 0701 - 09/13 0700 In: 360 [P.O.:360] Out: 1600 [Urine:600; Emesis/NG output:1000]  PE: Gen: NAD Resp: nonlabored Card: tachycardic Abd: soft ventral hernia, nontender  Lab Results:  Recent Labs    06/09/24 0331 06/10/24 0349  WBC 12.2* 11.0*  HGB 14.2 13.2  HCT 41.9 38.5*  PLT 211 192   Recent Labs    06/09/24 0331 06/10/24 0349  NA 137 135  K 3.7 3.9  CL 100 97*  CO2 26 27  GLUCOSE 111* 114*  BUN 21 25*  CREATININE 0.78 0.80  CALCIUM  8.9 8.8*   No results for input(s): LABPROT, INR in the last 72 hours.    Component Value Date/Time   NA 135 06/10/2024 0349   NA 143 04/23/2022 1454   K 3.9 06/10/2024 0349   CL 97 (L) 06/10/2024 0349   CO2 27 06/10/2024 0349   GLUCOSE 114 (H) 06/10/2024 0349   BUN 25 (H) 06/10/2024 0349   BUN 23 04/23/2022 1454   CREATININE 0.80 06/10/2024 0349   CALCIUM  8.8 (L) 06/10/2024 0349   PROT 7.9 06/05/2024 1241   ALBUMIN  3.9 06/05/2024 1241   AST 37 06/05/2024 1241   ALT 22 06/05/2024 1241   ALKPHOS 50 06/05/2024 1241   BILITOT 1.4 (H) 06/05/2024 1241   GFRNONAA >60 06/10/2024 0349   GFRAA  08/14/2007 0312    >60        The eGFR has been calculated using the MDRD equation. This calculation has not been validated in all clinical    Assessment/Plan SBO   -films now show contrast in the colon. NG tube has been removed. -continue full liquids today due to vomiting lastnight -Ok for heparin  gtt, please continue holding Eliquis  for now.   LOS: 5 days   I reviewed last 24 h vitals and pain scores, last  48 h intake and output, last 24 h labs and trends, and last 24 h imaging results.  This care required moderate level of medical decision making.   Herlene Righter [facility] 06/10/2024, 10:15 AM Please see Amion for pager number during day hours 7:00am-4:30pm or 7:00am -11:30am on weekends

## 2024-06-10 NOTE — Progress Notes (Signed)
 Echocardiogram 2D Echocardiogram has been performed.  Gary Rice 06/10/2024, 10:56 AM

## 2024-06-10 NOTE — Evaluation (Signed)
 Occupational Therapy Evaluation Patient Details Name: Gary Rice MRN: [id number] DOB: 04/18/1931 Today's Date: 06/10/2024   History of Present Illness   Pt is a 89 y.o. M who presents 06/05/2024 with SBO. Significant PMH:  A-fib on Cardizem  and Eliquis , history of SVT, history of inguinal hernia, history of bowel resection in 2022, inguinal hernia repair 2022.     Clinical Impressions At baseline, pt is Independent with ADLs and IADLs and Independent to Mod I with functional mobility with intermittent use of a SPC. Pt is also the primary caregiver for his wife. Pt now presents with decreased activity tolerance, impaired cardiopulmonary status, pain affecting functional level, and decreased safety and independence with functional tasks. Pt demonstrating ability to complete ADLs Ind to Max assist from bed level and rolling L/R in the bed with Supervision the session. However, pt functional level this session was significantly impacted by nausea, abdominal pain pt described as gas pains, and with pt's HR elevating up to 136 bpm with bed mobility with RN notified. Pt HR otherwise largely 100 to mid-110s and O2 sat >/92% on 3L continuous O2 through nasal cannula throughout session. Pt participated well in session and is motivated to return to PLOF. Suspect pt will make quick progress toward OT goals once management of HR, nausea, and pain are improved. Pt will benefit from acute skilled OT services to address deficits and increase safety and independence with functional tasks. Post acute discharge, pt will benefit from [facility] OT.      If plan is discharge home, recommend the following:   A little help with walking and/or transfers;A lot of help with bathing/dressing/bathroom;Assistance with cooking/housework;Assist for transportation;Help with stairs or ramp for entrance     Functional Status Assessment   Patient has had a recent decline in their functional status and demonstrates the ability  to make significant improvements in function in a reasonable and predictable amount of time.     Equipment Recommendations   Other (comment) (TBD based on pt progress)     Recommendations for Other Services         Precautions/Restrictions   Precautions Precautions: Fall;Other (comment) Recall of Precautions/Restrictions: Intact Precaution/Restrictions Comments: watch HR & SpO2 Restrictions Weight Bearing Restrictions Per Provider Order: No     Mobility Bed Mobility Overal bed mobility: Needs Assistance Bed Mobility: Rolling Rolling: Supervision         General bed mobility comments: Further bed mobility deferred secondary to pt nausea and pt with elavated HR up to 136 bpm with rolling in the bed.    Transfers Overall transfer level: Needs assistance                 General transfer comment: Deferred this session secondary to pt with nausea and HR up to 136 with bed mobility. Ptrequired CGA for STS and step-pivot transfers/ambulation with use of SPC during PT session on 06/09/24.      Balance Overall balance assessment: Needs assistance Sitting-balance support: No upper extremity supported, Feet supported (in long sitting in the bed) Sitting balance-Leahy Scale: Good Sitting balance - Comments: pt able to come to long sitting in the bed with use of bed rails and maintain without UE support; but duration was limited by nausea and abdominal pain                                   ADL either performed or assessed with clinical judgement  ADL Overall ADL's : Needs assistance/impaired Eating/Feeding: Independent;Bed level   Grooming: Set up;Bed level   Upper Body Bathing: Set up;Bed level   Lower Body Bathing: Moderate assistance;Maximal assistance;Bed level   Upper Body Dressing : Minimal assistance;Bed level   Lower Body Dressing: Maximal assistance;Bed level     Toilet Transfer Details (indicate cue type and reason): unable this  session           General ADL Comments: Pt functional level simulated form bed level this session and limited by pt sgnificantly decreased activity tolerance, nausea, and increaed HR with bed mobility. OT initiated education in energy conservation strategies.     Vision Baseline Vision/History: 1 Wears glasses Ability to See in Adequate Light: 0 Adequate (with glasses on) Patient Visual Report: No change from baseline Vision Assessment?: No apparent visual deficits (with glasses on) Additional Comments: Vision [facility] for tasks assessed with glasses on; not formally screened or assessed     Perception         Praxis         Pertinent Vitals/Pain Pain Assessment Pain Assessment: Faces Faces Pain Scale: Hurts even more Pain Location: abdomen Pain Descriptors / Indicators: Discomfort, Other (Comment) (pt reports gas pains) Pain Intervention(s): Limited activity within patient's tolerance, Monitored during session, Repositioned, Other (comment) (RN notified)     Extremity/Trunk Assessment Upper Extremity Assessment Upper Extremity Assessment: Right hand dominant;Overall [facility] for tasks assessed   Lower Extremity Assessment Lower Extremity Assessment: Defer to PT evaluation   Cervical / Trunk Assessment Cervical / Trunk Assessment: Kyphotic   Communication Communication Communication: No apparent difficulties   Cognition Arousal: Alert Behavior During Therapy: WFL for tasks assessed/performed Cognition: No apparent impairments             OT - Cognition Comments: Pt AAOx4 and pleasant throughout session.                 Following commands: Intact       Cueing  General Comments   Cueing Techniques: Verbal cues;Visual cues  HR largely 100 to mid-110s, but elevating to 136 bpm with bed mobility. HR quickly recovered to 106 bpm with rest. O2 sat >/92% on 2L continuous O2 through nasal cannula throughout session.   Exercises     Shoulder Instructions       Home Living Family/patient expects to be discharged to:: Private residence Living Arrangements: Spouse/significant other Available Help at Discharge: Family;Neighbor;Available PRN/intermittently Type of Home: House Home Access: Stairs to enter [employer] of Steps: 5 Entrance Stairs-Rails: Right Home Layout: Two level Alternate Level Stairs-Number of Steps: 17 Alternate Level Stairs-Rails: Right;Left Bathroom Shower/Tub: Walk-in shower;Tub/shower unit (pt uses walk-in shower)   Bathroom Toilet: Handicapped height     Home Equipment: Cane - single point;Shower seat          Prior Functioning/Environment Prior Level of Function : Independent/Modified Independent             Mobility Comments: no AD for household, cane for community ambulation ADLs Comments: Pt is Ind with ADLs and IADLs and is the primary caregiver for his wife of 71 years.    OT Problem List: Decreased activity tolerance;Cardiopulmonary status limiting activity;Pain   OT Treatment/Interventions: Self-care/ADL training;Energy conservation;DME and/or AE instruction;Therapeutic activities;Patient/family education;Balance training      OT Goals(Current goals can be found in the care plan section)   Acute Rehab OT Goals Patient Stated Goal: to feel better and return home to continue taking care of his wife OT Goal  Formulation: With patient Time For Goal Achievement: 06/24/24 Potential to Achieve Goals: Good ADL Goals Pt Will Perform Grooming: with modified independence;standing Pt Will Perform Lower Body Bathing: with modified independence;sitting/lateral leans;sit to/from stand Pt Will Perform Lower Body Dressing: with modified independence;sitting/lateral leans;sit to/from stand Pt Will Transfer to Toilet: with modified independence;ambulating;regular height toilet (with least restrictive AD) Pt Will Perform Toileting - Clothing Manipulation and hygiene: with modified  independence;sitting/lateral leans;sit to/from stand Additional ADL Goal #1: Patient will demonstrate ability to Independently state 3 energy conservation strategies to increase safety and independence with functional tasks.   OT Frequency:  Min 2X/week    Co-evaluation              AM-PAC OT 6 Clicks Daily Activity     Outcome Measure Help from another person eating meals?: None Help from another person taking care of personal grooming?: A Little Help from another person toileting, which includes using toliet, bedpan, or urinal?: A Lot Help from another person bathing (including washing, rinsing, drying)?: A Lot Help from another person to put on and taking off regular upper body clothing?: A Little Help from another person to put on and taking off regular lower body clothing?: A Little 6 Click Score: 17   End of Session Equipment Utilized During Treatment: Oxygen (3L continuous O2 through nasal cannula) Nurse Communication: Mobility status;Other (comment) (RN gave pt nausea medication prior to session. Pt limited by nausea during session. Pt reports abdominal gas pains. Pt with HR up to 136 bpm with bed mobility.)  Activity Tolerance: Patient limited by pain;Treatment limited secondary to medical complications (Comment) (Limited by nausea and elavated HR with bedmobility) Patient left: in bed;with call bell/phone within reach;with bed alarm set  OT Visit Diagnosis: Other abnormalities of gait and mobility (R26.89);Pain;Other (comment) (decreased activity tolerance)                Time: 8367-8350 OT Time Calculation (min): 17 min Charges:  OT General Charges $OT Visit: 1 Visit OT Evaluation $OT Eval Low Complexity: 1 Low  Margarie Rockey HERO., OTR/L, MA Acute Rehab [phone]   Margarie FORBES Horns 06/10/2024, 5:41 PM

## 2024-06-10 NOTE — Plan of Care (Signed)
 Pt educated and will manage and tolerate treatments to clinical improvements

## 2024-06-10 NOTE — Progress Notes (Signed)
 PROGRESS NOTE        PATIENT DETAILS Name: Gary Rice Age: 89 y.o. Sex: male Date of Birth: January 10, 1931 Admit Date: 06/05/2024 Admitting Physician Delon Hoe, DO ERE:Fnmmnt, Beverley, MD  Brief Summary: Patient is a 89 y.o.  male with history of A-fib-presented with SBO and A-fib RVR.  Significant events: 9/8>> admit to [facility]  Significant studies: 9/8>> CT abdomen/pelvis: High-grade SBO. 9/9>> SBO protocol x-ray: No passage of contrast into colon yet.  Significant microbiology data: None  Procedures: None  Consults: None  Subjective: Had some vomiting yesterday evening but none overnight.  Tolerating liquid diet.  Objective: Vitals: Blood pressure 98/67, pulse (!) 105, temperature 98.1 F (36.7 C), temperature source Oral, resp. rate 18, weight 61.2 kg, SpO2 95%.   Exam: Gen Exam:Alert awake-not in any distress HEENT:atraumatic, normocephalic Chest: B/L clear to auscultation anteriorly CVS:S1S2 regular Abdomen:soft non tender, non distended Extremities:no edema Neurology: Non focal Skin: no rash  Pertinent Labs/Radiology:    Latest Ref Rng & Units 06/10/2024    3:49 AM 06/09/2024    3:31 AM 06/08/2024    2:50 AM  CBC  WBC 4.0 - 10.5 K/uL 11.0  12.2  12.0   Hemoglobin 13.0 - 17.0 g/dL 86.7  85.7  86.1   Hematocrit 39.0 - 52.0 % 38.5  41.9  41.0   Platelets 150 - 400 K/uL 192  211  181     Lab Results  Component Value Date   NA 135 06/10/2024   K 3.9 06/10/2024   CL 97 (L) 06/10/2024   CO2 27 06/10/2024     Assessment/Plan: SBO Improving with supportive care Tolerating liquids NG tube discontinued General Surgery following and reacting care.  A-fib RVR Rate controlled Continue beta-blocker Attempt to slowly taper down Cardizem  infusion Continue heparin  drip-until we assure patient does not require laparotomy.  AKI Hemodynamically mediated Resolved.  Hypokalemia Likely secondary to NGT  suctioning. Repleted.  HLD Hold statins for now-resume when oral intake stable.  Code status:   Code Status: Full Code   DVT Prophylaxis: IV heparin   Family Communication: Spouse-[phone] called on 9/13-spoke to neighbor-apparently spouse is not doing too well-neighbor has plans to call 911.   Disposition Plan: Status is: Inpatient Remains inpatient appropriate because: Severity of illness   Planned Discharge Destination:Home health   Diet: Diet Order             Diet full liquid Room service appropriate? Yes; Fluid consistency: Thin  Diet effective now                     Antimicrobial agents: Anti-infectives (From admission, onward)    None        MEDICATIONS: Scheduled Meds:  Influenza vac split trivalent PF  0.5 mL Intramuscular Tomorrow-1000   ipratropium  2 spray Each Nare BID   metoprolol  tartrate  25 mg Oral BID   pantoprazole  (PROTONIX ) IV  40 mg Intravenous Daily   sodium chloride  flush  3 mL Intravenous Q12H   Continuous Infusions:  diltiazem  (CARDIZEM ) infusion 7.5 mg/hr (06/10/24 0751)   heparin  950 Units/hr (06/10/24 0916)   PRN Meds:.HYDROmorphone  (DILAUDID ) injection, menthol , ondansetron  **OR** ondansetron  (ZOFRAN ) IV   I have personally reviewed following labs and imaging studies  LABORATORY DATA: CBC: Recent Labs  Lab 06/05/24 1241 06/06/24 0530 06/07/24 0432 06/08/24 0250 06/09/24 0331 06/10/24 9650  WBC 8.7 7.3 9.8 12.0* 12.2* 11.0*  NEUTROABS 6.1  --   --   --   --   --   HGB 16.0 14.8 14.2 13.8 14.2 13.2  HCT 45.8 43.3 42.3 41.0 41.9 38.5*  MCV 91.1 92.9 93.4 94.0 93.5 93.0  PLT 238 168 192 181 211 192    Basic Metabolic Panel: Recent Labs  Lab 06/06/24 0529 06/06/24 0530 06/07/24 0432 06/08/24 0250 06/09/24 0331 06/10/24 0349  NA  --  138 139 139 137 135  K  --  3.4* 3.4* 3.7 3.7 3.9  CL  --  94* 95* 97* 100 97*  CO2  --  30 28 29 26 27   GLUCOSE  --  104* 105* 101* 111* 114*  BUN  --  48*  48* 28* 21 25*  CREATININE  --  1.07 1.14 0.85 0.78 0.80  CALCIUM   --  9.1 9.4 9.0 8.9 8.8*  MG 2.1  --  2.3 2.1 1.9 1.9  PHOS  --   --  2.3* 1.9* 2.3* 2.9    GFR: Estimated Creatinine Clearance: 52.1 mL/min (by C-G formula based on SCr of 0.8 mg/dL).  Liver Function Tests: Recent Labs  Lab 06/05/24 1241  AST 37  ALT 22  ALKPHOS 50  BILITOT 1.4*  PROT 7.9  ALBUMIN  3.9   Recent Labs  Lab 06/05/24 1241  LIPASE 18   No results for input(s): AMMONIA in the last 168 hours.  Coagulation Profile: Recent Labs  Lab 06/05/24 1241  INR 1.4*    Cardiac Enzymes: No results for input(s): CKTOTAL, CKMB, CKMBINDEX, TROPONINI in the last 168 hours.  BNP (last 3 results) No results for input(s): PROBNP in the last 8760 hours.  Lipid Profile: No results for input(s): CHOL, HDL, LDLCALC, TRIG, CHOLHDL, LDLDIRECT in the last 72 hours.  Thyroid  Function Tests: No results for input(s): TSH, T4TOTAL, FREET4, T3FREE, THYROIDAB in the last 72 hours.  Anemia Panel: No results for input(s): VITAMINB12, FOLATE, FERRITIN, TIBC, IRON , RETICCTPCT in the last 72 hours.  Urine analysis:    Component Value Date/Time   COLORURINE YELLOW 02/23/2022 1703   APPEARANCEUR CLEAR 02/23/2022 1703   LABSPEC 1.015 02/23/2022 1703   PHURINE 6.0 02/23/2022 1703   GLUCOSEU NEGATIVE 02/23/2022 1703   HGBUR NEGATIVE 02/23/2022 1703   BILIRUBINUR NEGATIVE 02/23/2022 1703   KETONESUR 5 (A) 02/23/2022 1703   PROTEINUR NEGATIVE 02/23/2022 1703   NITRITE NEGATIVE 02/23/2022 1703   LEUKOCYTESUR NEGATIVE 02/23/2022 1703    Sepsis Labs: Lactic Acid, Venous No results found for: LATICACIDVEN  MICROBIOLOGY: Recent Results (from the past 240 hours)  MRSA Next Gen by PCR, Nasal     Status: None   Collection Time: 06/06/24 12:45 AM   Specimen: Nasal Mucosa; Nasal Swab  Result Value Ref Range Status   MRSA by PCR Next Gen NOT DETECTED NOT DETECTED Final     Comment: (NOTE) The GeneXpert MRSA Assay (FDA approved for NASAL specimens only), is one component of a comprehensive MRSA colonization surveillance program. It is not intended to diagnose MRSA infection nor to guide or monitor treatment for MRSA infections. Test performance is not FDA approved in patients less than 49 years old. Performed at [facility], [address], [place], [place] [address]     RADIOLOGY STUDIES/RESULTS: DG Abd Portable 1V Result Date: 06/09/2024 CLINICAL DATA:  Nausea and vomiting. EXAM: PORTABLE ABDOMEN - 1 VIEW COMPARISON:  June 08, 2024. FINDINGS: Continued small bowel dilatation is noted without  colonic dilatation. Contrast and stool is noted in nondilated colon. These findings are concerning for distal small bowel obstruction. IMPRESSION: Continued small bowel dilatation is noted concerning for distal small bowel obstruction. Electronically Signed   By: Lynwood Landy Raddle M.D.   On: 06/09/2024 17:39     LOS: 5 days   Donalda Applebaum, MD  [facility]    To contact the attending provider between 7A-7P or the covering provider during after hours 7P-7A, please log into the web site [email or web address] and access using universal [place] password for that web site. If you do not have the password, please call the hospital operator.  06/10/2024, 10:38 AM

## 2024-06-11 ENCOUNTER — Encounter (HOSPITAL_COMMUNITY): Payer: Self-pay | Admitting: Internal Medicine

## 2024-06-11 ENCOUNTER — Inpatient Hospital Stay (HOSPITAL_COMMUNITY)

## 2024-06-11 ENCOUNTER — Other Ambulatory Visit: Payer: Self-pay

## 2024-06-11 DIAGNOSIS — E785 Hyperlipidemia, unspecified: Secondary | ICD-10-CM | POA: Diagnosis not present

## 2024-06-11 DIAGNOSIS — I4891 Unspecified atrial fibrillation: Secondary | ICD-10-CM | POA: Diagnosis not present

## 2024-06-11 DIAGNOSIS — E876 Hypokalemia: Secondary | ICD-10-CM

## 2024-06-11 DIAGNOSIS — I7121 Aneurysm of the ascending aorta, without rupture: Secondary | ICD-10-CM

## 2024-06-11 DIAGNOSIS — Z515 Encounter for palliative care: Secondary | ICD-10-CM

## 2024-06-11 DIAGNOSIS — N179 Acute kidney failure, unspecified: Secondary | ICD-10-CM | POA: Diagnosis not present

## 2024-06-11 DIAGNOSIS — J9601 Acute respiratory failure with hypoxia: Secondary | ICD-10-CM | POA: Diagnosis not present

## 2024-06-11 DIAGNOSIS — I480 Paroxysmal atrial fibrillation: Secondary | ICD-10-CM

## 2024-06-11 DIAGNOSIS — K56609 Unspecified intestinal obstruction, unspecified as to partial versus complete obstruction: Secondary | ICD-10-CM | POA: Diagnosis not present

## 2024-06-11 DIAGNOSIS — J690 Pneumonitis due to inhalation of food and vomit: Secondary | ICD-10-CM | POA: Diagnosis not present

## 2024-06-11 DIAGNOSIS — I959 Hypotension, unspecified: Secondary | ICD-10-CM

## 2024-06-11 LAB — GLUCOSE, CAPILLARY
Glucose-Capillary: 129 mg/dL — ABNORMAL HIGH (ref 70–99)
Glucose-Capillary: 130 mg/dL — ABNORMAL HIGH (ref 70–99)
Glucose-Capillary: 132 mg/dL — ABNORMAL HIGH (ref 70–99)
Glucose-Capillary: 159 mg/dL — ABNORMAL HIGH (ref 70–99)
Glucose-Capillary: 96 mg/dL (ref 70–99)

## 2024-06-11 LAB — BASIC METABOLIC PANEL WITH GFR
Anion gap: 13 (ref 5–15)
Anion gap: 12 (ref 5–15)
BUN: 26 mg/dL — ABNORMAL HIGH (ref 8–23)
BUN: 24 mg/dL — ABNORMAL HIGH (ref 8–23)
CO2: 27 mmol/L (ref 22–32)
CO2: 26 mmol/L (ref 22–32)
Calcium: 8.9 mg/dL (ref 8.9–10.3)
Calcium: 8.7 mg/dL — ABNORMAL LOW (ref 8.9–10.3)
Chloride: 95 mmol/L — ABNORMAL LOW (ref 98–111)
Chloride: 96 mmol/L — ABNORMAL LOW (ref 98–111)
Creatinine, Ser: 0.91 mg/dL (ref 0.61–1.24)
Creatinine, Ser: 0.9 mg/dL (ref 0.61–1.24)
GFR, Estimated: >60 mL/min (ref >60)
GFR, Estimated: >60 mL/min (ref >60)
Glucose, Bld: 105 mg/dL — ABNORMAL HIGH (ref 70–99)
Glucose, Bld: 141 mg/dL — ABNORMAL HIGH (ref 70–99)
Potassium: 3.4 mmol/L — ABNORMAL LOW (ref 3.5–5.1)
Potassium: 3.5 mmol/L (ref 3.5–5.1)
Sodium: 135 mmol/L (ref 135–145)
Sodium: 134 mmol/L — ABNORMAL LOW (ref 135–145)

## 2024-06-11 LAB — PHOSPHORUS
Phosphorus: 2.7 mg/dL (ref 2.5–4.6)
Phosphorus: 2.5 mg/dL (ref 2.5–4.6)

## 2024-06-11 LAB — OCCULT BLOOD GASTRIC / DUODENUM (SPECIMEN CUP): Occult Blood, Gastric: POSITIVE — AB

## 2024-06-11 LAB — HEPARIN LEVEL (UNFRACTIONATED): Heparin Unfractionated: 0.39 [IU]/mL (ref 0.30–0.70)

## 2024-06-11 LAB — HEMOGLOBIN AND HEMATOCRIT, BLOOD
HCT: 38.2 % — ABNORMAL LOW (ref 39.0–52.0)
Hemoglobin: 12.6 g/dL — ABNORMAL LOW (ref 13.0–17.0)

## 2024-06-11 LAB — CBC
HCT: 38.8 % — ABNORMAL LOW (ref 39.0–52.0)
Hemoglobin: 13.1 g/dL (ref 13.0–17.0)
MCH: 31.3 pg (ref 26.0–34.0)
MCHC: 33.8 g/dL (ref 30.0–36.0)
MCV: 92.6 fL (ref 80.0–100.0)
Platelets: 188 K/uL (ref 150–400)
RBC: 4.19 MIL/uL — ABNORMAL LOW (ref 4.22–5.81)
RDW: 12.6 % (ref 11.5–15.5)
WBC: 17.6 K/uL — ABNORMAL HIGH (ref 4.0–10.5)
nRBC: 0 % (ref 0.0–0.2)

## 2024-06-11 LAB — T4, FREE: Free T4: 1.02 ng/dL (ref 0.61–1.12)

## 2024-06-11 LAB — LACTIC ACID, PLASMA
Lactic Acid, Venous: 1.3 mmol/L (ref 0.5–1.9)
Lactic Acid, Venous: 1.3 mmol/L (ref 0.5–1.9)

## 2024-06-11 LAB — MAGNESIUM
Magnesium: 1.6 mg/dL — ABNORMAL LOW (ref 1.7–2.4)
Magnesium: 2.2 mg/dL (ref 1.7–2.4)

## 2024-06-11 LAB — TSH: TSH: 0.739 u[IU]/mL (ref 0.350–4.500)

## 2024-06-11 LAB — MRSA NEXT GEN BY PCR, NASAL: MRSA by PCR Next Gen: NOT DETECTED

## 2024-06-11 LAB — TROPONIN I (HIGH SENSITIVITY): Troponin I (High Sensitivity): 19 ng/L — ABNORMAL HIGH (ref <18)

## 2024-06-11 MED ORDER — POTASSIUM CHLORIDE 10 MEQ/100ML IV SOLN: 10.0000 meq | INTRAVENOUS | Status: DC

## 2024-06-11 MED ORDER — IPRATROPIUM BROMIDE 0.02 % IN SOLN: 0.5000 mg | RESPIRATORY_TRACT | Status: DC | PRN

## 2024-06-11 MED ORDER — THIAMINE HCL 100 MG/ML IJ SOLN
100.0000 mg | Freq: Every day | INTRAMUSCULAR | Status: AC
  Administered 2024-06-11 – 2024-06-15 (×5): 100 mg via INTRAVENOUS
  Filled 2024-06-11 (×5): qty 2

## 2024-06-11 MED ORDER — LACTATED RINGERS IV BOLUS
1000.0000 mL | Freq: Once | INTRAVENOUS | Status: AC
  Administered 2024-06-11: 1000 mL via INTRAVENOUS

## 2024-06-11 MED ORDER — CEFTRIAXONE SODIUM 2 G IJ SOLR: 2.0000 g | INTRAMUSCULAR | Status: DC

## 2024-06-11 MED ORDER — PANTOPRAZOLE SODIUM 40 MG IV SOLR
40.0000 mg | Freq: Two times a day (BID) | INTRAVENOUS | Status: DC
  Administered 2024-06-11 – 2024-06-21 (×22): 40 mg via INTRAVENOUS
  Filled 2024-06-11 (×22): qty 10

## 2024-06-11 MED ORDER — AMIODARONE LOAD VIA INFUSION
150.0000 mg | Freq: Once | INTRAVENOUS | Status: AC
  Administered 2024-06-11: 150 mg via INTRAVENOUS
  Filled 2024-06-11: qty 83.34

## 2024-06-11 MED ORDER — AMIODARONE LOAD VIA INFUSION
150.0000 mg | Freq: Once | INTRAVENOUS | Status: AC | PRN
  Administered 2024-06-11: 150 mg via INTRAVENOUS

## 2024-06-11 MED ORDER — AMIODARONE HCL IN DEXTROSE 360-4.14 MG/200ML-% IV SOLN
30.0000 mg/h | INTRAVENOUS | Status: DC
  Administered 2024-06-11 – 2024-06-16 (×18): 60 mg/h via INTRAVENOUS
  Administered 2024-06-17: 30 mg/h via INTRAVENOUS
  Administered 2024-06-17 (×2): 60 mg/h via INTRAVENOUS
  Administered 2024-06-18 – 2024-06-22 (×9): 30 mg/h via INTRAVENOUS
  Filled 2024-06-11 (×32): qty 200

## 2024-06-11 MED ORDER — SODIUM CHLORIDE 0.9 % IV SOLN: 1.0000 g | INTRAVENOUS | Status: DC

## 2024-06-11 MED ORDER — IPRATROPIUM BROMIDE 0.02 % IN SOLN
0.5000 mg | Freq: Four times a day (QID) | RESPIRATORY_TRACT | Status: DC
  Administered 2024-06-11: 0.5 mg via RESPIRATORY_TRACT
  Filled 2024-06-11: qty 2.5

## 2024-06-11 MED ORDER — SODIUM CHLORIDE 0.9% FLUSH: 10.0000 mL | INTRAVENOUS | Status: DC | PRN

## 2024-06-11 MED ORDER — INSULIN ASPART 100 UNIT/ML IJ SOLN
2.0000 [IU] | INTRAMUSCULAR | Status: DC
  Administered 2024-06-11: 2 [IU] via SUBCUTANEOUS

## 2024-06-11 MED ORDER — PIPERACILLIN-TAZOBACTAM 3.375 G IVPB
3.3750 g | Freq: Three times a day (TID) | INTRAVENOUS | Status: AC
  Administered 2024-06-11 – 2024-06-15 (×15): 3.375 g via INTRAVENOUS
  Filled 2024-06-11 (×15): qty 50

## 2024-06-11 MED ORDER — SODIUM CHLORIDE 0.9% FLUSH
10.0000 mL | Freq: Two times a day (BID) | INTRAVENOUS | Status: DC
  Administered 2024-06-11 – 2024-06-15 (×8): 10 mL
  Administered 2024-06-15: 30 mL
  Administered 2024-06-16 – 2024-06-20 (×9): 10 mL
  Administered 2024-06-21: 20 mL
  Administered 2024-06-22 – 2024-06-24 (×5): 10 mL

## 2024-06-11 MED ORDER — LACTATED RINGERS IV BOLUS
500.0000 mL | Freq: Once | INTRAVENOUS | Status: AC
  Administered 2024-06-11: 500 mL via INTRAVENOUS

## 2024-06-11 MED ORDER — TRAVASOL 10 % IV SOLN
INTRAVENOUS | Status: AC
  Filled 2024-06-11: qty 363.6

## 2024-06-11 MED ORDER — CARMEX CLASSIC LIP BALM EX OINT
TOPICAL_OINTMENT | CUTANEOUS | Status: DC | PRN
  Filled 2024-06-11: qty 10

## 2024-06-11 MED ORDER — INSULIN ASPART 100 UNIT/ML IJ SOLN
0.0000 [IU] | INTRAMUSCULAR | Status: DC
  Administered 2024-06-11: 2 [IU] via SUBCUTANEOUS
  Administered 2024-06-11: 1 [IU] via SUBCUTANEOUS
  Administered 2024-06-12: 2 [IU] via SUBCUTANEOUS
  Administered 2024-06-12 (×2): 1 [IU] via SUBCUTANEOUS
  Administered 2024-06-13: 2 [IU] via SUBCUTANEOUS
  Administered 2024-06-13 – 2024-06-14 (×7): 1 [IU] via SUBCUTANEOUS
  Administered 2024-06-14: 2 [IU] via SUBCUTANEOUS
  Administered 2024-06-14 – 2024-06-15 (×5): 1 [IU] via SUBCUTANEOUS

## 2024-06-11 MED ORDER — CHLORHEXIDINE GLUCONATE CLOTH 2 % EX PADS
6.0000 | MEDICATED_PAD | Freq: Every day | CUTANEOUS | Status: DC
  Administered 2024-06-11 – 2024-06-24 (×14): 6 via TOPICAL

## 2024-06-11 MED ORDER — ALBUMIN HUMAN 25 % IV SOLN
25.0000 g | INTRAVENOUS | Status: AC
  Administered 2024-06-11: 25 g via INTRAVENOUS
  Filled 2024-06-11: qty 100

## 2024-06-11 MED ORDER — PROCHLORPERAZINE EDISYLATE 10 MG/2ML IJ SOLN: 5.0000 mg | Freq: Four times a day (QID) | INTRAMUSCULAR | Status: DC | PRN

## 2024-06-11 MED ORDER — METOPROLOL TARTRATE 5 MG/5ML IV SOLN
2.5000 mg | Freq: Four times a day (QID) | INTRAVENOUS | Status: DC
  Administered 2024-06-11 (×2): 2.5 mg via INTRAVENOUS
  Filled 2024-06-11 (×3): qty 5

## 2024-06-11 MED ORDER — POTASSIUM PHOSPHATES 15 MMOLE/5ML IV SOLN
10.0000 mmol | Freq: Once | INTRAVENOUS | Status: AC
  Administered 2024-06-11: 10 mmol via INTRAVENOUS
  Filled 2024-06-11: qty 3.33

## 2024-06-11 MED ORDER — DEXTROSE IN LACTATED RINGERS 5 % IV SOLN: INTRAVENOUS | Status: DC

## 2024-06-11 MED ORDER — MAGNESIUM SULFATE 2 GM/50ML IV SOLN
2.0000 g | Freq: Once | INTRAVENOUS | Status: DC
Start: 2024-06-11 — End: 2024-06-11

## 2024-06-11 MED ORDER — MAGNESIUM SULFATE 2 GM/50ML IV SOLN
2.0000 g | Freq: Once | INTRAVENOUS | Status: AC
  Administered 2024-06-11: 2 g via INTRAVENOUS
  Filled 2024-06-11: qty 50

## 2024-06-11 MED ORDER — POTASSIUM CHLORIDE 10 MEQ/100ML IV SOLN
10.0000 meq | INTRAVENOUS | Status: AC
  Administered 2024-06-11 (×2): 10 meq via INTRAVENOUS
  Filled 2024-06-11 (×2): qty 100

## 2024-06-11 NOTE — Progress Notes (Signed)
 Initial Nutrition Assessment  DOCUMENTATION CODES:  Not applicable  INTERVENTION:  TPN for nutrition until return of bowel function Management per pharmacy team, nutrition recommendations are below Pt without adequate nutrition x >7 days, will be at risk for refeeding Recommend 100mg  of thiamine  x 5 days  NUTRITION DIAGNOSIS:  Inadequate oral intake related to inability to eat as evidenced by NPO status.  GOAL:  Patient will meet greater than or equal to 90% of their needs  MONITOR:  I & O's, Labs  REASON FOR ASSESSMENT:  Consult New TPN/TNA  ASSESSMENT: Pt with hx of atrial fibrillation, and prior SBO and multiple open abdominal surgeries (appendectomy, inguinal hernia repair, ex lap with small bowel resection) presented to ED with vomiting, abdominal pain, and constipation. Imaging showed high grade SBO.   9/8 - presented to ED, SBO seen on imaging, NPO, NGT placed for decompression 9/9 - SBO protocol initiated 9/11 - contrast seen in colon, NGT clamped and clears started, NGT removed 9/12 - full liquids 9/13 - NGT replaced, output almost immediately, gastric contents positive for blood 9/14 - transferred to ICU, imaging now showing aspiration pneumonia  RD working remotely.  New TPN consult received for pt transferred to ICU this AM. Reviewed chart and pt has mad minimal nutrition x 6 days of his admission. Briefly on a diet but based on amount of output from NGT after replacement, likely had very little absorption. Pt also with several days of poor PO intake PTA due to vomiting and abdominal pain. Also note that pt has experienced weight loss over the last 6 months. At baseline, pt independent and lives at home with his wife.   PICC line to be placed today for initiation of parenteral nutrition. Pt will be at risk for refeeding due to his >7 week of inadequate oral intake. Currently on dextrose  containing IVF. Recommend adding thiamine  x 5 days and daily monitoring of  electrolytes until pt is tolerating full rate TPN. Discussed with pharmacy team.   Will follow-up in-person for physical exam and nutrition hx.   Admit weight: 61.2 kg   Current weight: 64.6 kg 8.5% weight loss seen over the last 6 months (3/10-9/14) which is not severe, but concerning in the context of pt's advanced age.    Intake/Output Summary (Last 24 hours) at 06/11/2024 0929 Last data filed at 06/11/2024 0530 Gross per 24 hour  Intake --  Output 4150 ml  Net -4150 ml  Net IO Since Admission: -10,779.69 mL [06/11/24 0929]  Drains/Lines: NGT 18 Fr. 2,866mL out x 24 hours  Average Meal Intake: 9/11-9/12: 31% intake x 4 recorded meals  Nutritionally Relevant Medications: Scheduled Meds:  insulin  aspart  2-6 Units Subcutaneous Q4H   pantoprazole  IV  40 mg Intravenous Q12H   Continuous Infusions:  dextrose  5% lactated ringers  75 mL/hr at 06/11/24 9093   piperacillin -tazobactam (ZOSYN )  IV     potassium chloride  10 mEq (06/11/24 0639)   PRN Meds: ondansetron , prochlorperazine   Labs Reviewed: Potassium 3.4 Chloride 95 BUN 26 Magnesium  1.6 CBG ranges from 105-132 mg/dL over the last 24 hours HgbA1c 5.8% (05/11/21)  NUTRITION - FOCUSED PHYSICAL EXAM: Defer to in-person assessment  Diet Order:   Diet Order             Diet NPO time specified  Diet effective now                   EDUCATION NEEDS:  Not appropriate for education at this time  Skin:  Skin Assessment: Reviewed RN Assessment  Last BM:  9/12  Height:  Ht Readings from Last 1 Encounters:  06/11/24 5' 8 (1.727 m)    Weight:  Wt Readings from Last 1 Encounters:  06/11/24 64.6 kg    Ideal Body Weight:  70 kg  BMI:  Body mass index is 21.65 kg/m.  Estimated Nutritional Needs:  Kcal:  1600-1800 kcal/d Protein:  80-95g/d Fluid:  1.8-2L/d    Vernell Lukes, RD, LDN, CNSC Registered Dietitian II Please reach out via secure chat

## 2024-06-11 NOTE — Progress Notes (Addendum)
 06/11/2024  Seen and examined. Looks a little dry Abd benign Afib/RVR improved with amio and fluids Hypoxemia improved with NGT to LIS Lactate ok CXR reviewed Agree could be aspiration plus some atelectasis from massive gastric distension CCS following, probably will need surgery but no emergent need. Giving more crystalloid Discussed with [facility], okay to leave on [facility] service, can take over PRN tomorrow if does not do well Agree with PICC for TPN, getting CVP may be helpful too to guide additional fluids. Very functional baseline so likely will do fine in surgery and reasonable to be full code at present, confirmed with patient. Updated neighbor over phone at patient request.  Rolan Sharps MD PCCM

## 2024-06-11 NOTE — Progress Notes (Addendum)
 PROGRESS NOTE        PATIENT DETAILS Name: Gary Rice Age: 89 y.o. Sex: male Date of Birth: 22-Aug-1931 Admit Date: 06/05/2024 Admitting Physician Delon Hoe, DO ERE:Fnmmnt, Beverley, MD  Brief Summary: Patient is a 89 y.o.  male with history of A-fib-presented with SBO and A-fib RVR.   SBO initially managed with conservative measures with some improvement-A-fib RVR managed with Cardizem /heparin  infusion-unfortunately-on 9/13-developed worsening gastric distention-vomited and aspirated, along with A-fib RVR and hypotension.  He was subsequently transferred to the ICU for close monitoring.  Significant events: 9/8>> admit to [facility] 9/13-9/14>> gastric distention-vomited-aspirated-on heated high flow-A-fib RVR with hypotension-started on amiodarone  infusion.  Transferred to ICU.  Significant studies: 9/8>> CT abdomen/pelvis: High-grade SBO. 9/9>> SBO protocol x-ray: No passage of contrast into colon yet. 9/11>> x-ray abdomen: Contrast now in the colon. 9/13>> x-ray abdomen: Persistent small bowel dilatation with obstruction.  Significant microbiology data: None  Procedures: None  Consults: PCCM [facility] Cardiology Palliative  Subjective: Overnight events noted-vomited/aspirated-now n.p.o.-on heated high flow-appears comfortable.  Objective: Vitals: Blood pressure 91/66, pulse (!) 136, temperature 98.3 F (36.8 C), temperature source Oral, resp. rate 15, height 5' 8 (1.727 m), weight 64.6 kg, SpO2 (!) 89%.   Exam: Awake/alert Chest: Clear to auscultation anteriorly CVS: S1-S2 irregular-tachycardic Abdomen: Soft-not distended Extremity: No edema Nonfocal exam   Pertinent Labs/Radiology:    Latest Ref Rng & Units 06/11/2024    4:17 AM 06/10/2024   10:51 PM 06/10/2024    3:49 AM  CBC  WBC 4.0 - 10.5 K/uL 17.6  14.1  11.0   Hemoglobin 13.0 - 17.0 g/dL 86.8  85.8  86.7   Hematocrit 39.0 - 52.0 % 38.8  42.4  38.5   Platelets 150 - 400 K/uL 188   231  192     Lab Results  Component Value Date   NA 134 (L) 06/11/2024   K 3.5 06/11/2024   CL 96 (L) 06/11/2024   CO2 26 06/11/2024     Assessment/Plan: SBO Initially some improvement with supportive care but now has worsened overnight-with gastric distention/vomiting  Back to n.p.o. status NG tube replaced overnight General Surgery contemplating laparotomy  Transient hypotension Likely due to volume loss/vomiting Blood pressure has improved with IV fluid boluses Supportive care-if worsens-PCCM plans to start pressors.  Chronic A-fib with RVR Initially rate controlled with Cardizem /beta-blocker but due to worsening bowel obstruction/aspiration-has developed worsening RVR with hypotension-hence has been switched to amiodarone  infusion IV heparin  on hold due to concern that some of the vomitus may have been coffee-ground Cardiology now following.   Acute hypoxic respiratory failure Likely due to aspiration of vomitus On antibiotics On heated high flow but comfortable Encourage incentive spirometry/flutter valve/mobilization  AKI Hemodynamically mediated Resolved.  Hypokalemia Likely secondary to NGT suctioning. Repleted.  HLD Hold statins for now-resume when oral intake stable.  Advance directives Spoke with patient today at bedside-he is okay with short-term intubation-to get him through his acute illness.  But he would not want anything long-term. Difficult social situation-his spouse Cy (patient is a primary caregiver)-is currently not well-and is under the care of neighbors at home.  She apparently is somewhat confused and has some cognitive issues at baseline.  Patient's 2 children have passed away-he is not in touch with his grandchildren (last spoke with one of them 3 years back).   This MD spoke with  patient earlier this morning-and asked him if in the event that he is not able to make decisions-or if his wife is not able to make decisions for him-he would like  his brother-in-law Garrel Corp (lives in [place], [place]) to make decisions for him. I have spoken with Garrel [phone]-over the phone this morning-alerted him of patient's hospitalization and overnight events.  He will reach out to his sister (patient's spouse) and see what is going on. Palliative care consulted earlier this morning.  Code status:   Code Status: Full Code   DVT Prophylaxis: IV heparin   Family Communication:  Brother-in-law-Mike [phone]-over the phone on 9/14  Spouse-[phone] called on 9/13-spoke to neighbor-apparently spouse is not doing too well-neighbor has plans to call 911.   Disposition Plan: Status is: Inpatient Remains inpatient appropriate because: Severity of illness   Planned Discharge Destination:Home health   Diet: Diet Order             Diet NPO time specified  Diet effective now                     Antimicrobial agents: Anti-infectives (From admission, onward)    Start     Dose/Rate Route Frequency Ordered Stop   06/11/24 0915  piperacillin -tazobactam (ZOSYN ) IVPB 3.375 g        3.375 g 12.5 mL/hr over 240 Minutes Intravenous Every 8 hours 06/11/24 0820 06/16/24 0559   06/11/24 0800  cefTRIAXone (ROCEPHIN) 1 g in sodium chloride  0.9 % 100 mL IVPB  Status:  Discontinued        1 g 200 mL/hr over 30 Minutes Intravenous Every 24 hours 06/11/24 0642 06/11/24 0710   06/11/24 0800  cefTRIAXone (ROCEPHIN) 2 g in sodium chloride  0.9 % 100 mL IVPB  Status:  Discontinued        2 g 200 mL/hr over 30 Minutes Intravenous Every 24 hours 06/11/24 0710 06/11/24 0815        MEDICATIONS: Scheduled Meds:  Chlorhexidine  Gluconate Cloth  6 each Topical Q0600   Influenza vac split trivalent PF  0.5 mL Intramuscular Tomorrow-1000   insulin  aspart  2-6 Units Subcutaneous Q4H   ipratropium  2 spray Each Nare BID   metoprolol  tartrate  2.5 mg Intravenous Q6H   pantoprazole  (PROTONIX ) IV  40 mg Intravenous Q12H    sodium chloride  flush  3 mL Intravenous Q12H   Continuous Infusions:  amiodarone  30 mg/hr (06/11/24 1041)   dextrose  5% lactated ringers  75 mL/hr at 06/11/24 0906   heparin  Stopped (06/11/24 0030)   piperacillin -tazobactam (ZOSYN )  IV 3.375 g (06/11/24 0922)   potassium chloride  10 mEq (06/11/24 1015)   PRN Meds:.HYDROmorphone  (DILAUDID ) injection, menthol , [DISCONTINUED] ondansetron  **OR** ondansetron  (ZOFRAN ) IV, prochlorperazine    I have personally reviewed following labs and imaging studies  LABORATORY DATA: CBC: Recent Labs  Lab 06/05/24 1241 06/06/24 0530 06/08/24 0250 06/09/24 0331 06/10/24 0349 06/10/24 2251 06/11/24 0417  WBC 8.7   < > 12.0* 12.2* 11.0* 14.1* 17.6*  NEUTROABS 6.1  --   --   --   --   --   --   HGB 16.0   < > 13.8 14.2 13.2 14.1 13.1  HCT 45.8   < > 41.0 41.9 38.5* 42.4 38.8*  MCV 91.1   < > 94.0 93.5 93.0 93.8 92.6  PLT 238   < > 181 211 192 231 188   < > = values in this interval not displayed.    Basic Metabolic Panel: Recent  Labs  Lab 06/07/24 0432 06/08/24 0250 06/09/24 0331 06/10/24 0349 06/10/24 2251 06/11/24 0417 06/11/24 0854  NA 139 139 137 135 132* 135 134*  K 3.4* 3.7 3.7 3.9 3.9 3.4* 3.5  CL 95* 97* 100 97* 96* 95* 96*  CO2 28 29 26 27 22 27 26   GLUCOSE 105* 101* 111* 114* 126* 105* 141*  BUN 48* 28* 21 25* 26* 26* 24*  CREATININE 1.14 0.85 0.78 0.80 0.80 0.91 0.90  CALCIUM  9.4 9.0 8.9 8.8* 8.8* 8.9 8.7*  MG 2.3 2.1 1.9 1.9 1.8 1.6*  --   PHOS 2.3* 1.9* 2.3* 2.9  --  2.7  --     GFR: Estimated Creatinine Clearance: 48.8 mL/min (by C-G formula based on SCr of 0.9 mg/dL).  Liver Function Tests: Recent Labs  Lab 06/05/24 1241  AST 37  ALT 22  ALKPHOS 50  BILITOT 1.4*  PROT 7.9  ALBUMIN  3.9   Recent Labs  Lab 06/05/24 1241  LIPASE 18   No results for input(s): AMMONIA in the last 168 hours.  Coagulation Profile: Recent Labs  Lab 06/05/24 1241  INR 1.4*    Cardiac Enzymes: No results for input(s):  CKTOTAL, CKMB, CKMBINDEX, TROPONINI in the last 168 hours.  BNP (last 3 results) No results for input(s): PROBNP in the last 8760 hours.  Lipid Profile: No results for input(s): CHOL, HDL, LDLCALC, TRIG, CHOLHDL, LDLDIRECT in the last 72 hours.  Thyroid  Function Tests: Recent Labs    06/11/24 0709  TSH 0.739  FREET4 1.02    Anemia Panel: No results for input(s): VITAMINB12, FOLATE, FERRITIN, TIBC, IRON , RETICCTPCT in the last 72 hours.  Urine analysis:    Component Value Date/Time   COLORURINE YELLOW 02/23/2022 1703   APPEARANCEUR CLEAR 02/23/2022 1703   LABSPEC 1.015 02/23/2022 1703   PHURINE 6.0 02/23/2022 1703   GLUCOSEU NEGATIVE 02/23/2022 1703   HGBUR NEGATIVE 02/23/2022 1703   BILIRUBINUR NEGATIVE 02/23/2022 1703   KETONESUR 5 (A) 02/23/2022 1703   PROTEINUR NEGATIVE 02/23/2022 1703   NITRITE NEGATIVE 02/23/2022 1703   LEUKOCYTESUR NEGATIVE 02/23/2022 1703    Sepsis Labs: Lactic Acid, Venous    Component Value Date/Time   LATICACIDVEN 1.3 06/11/2024 0854    MICROBIOLOGY: Recent Results (from the past 240 hours)  MRSA Next Gen by PCR, Nasal     Status: None   Collection Time: 06/06/24 12:45 AM   Specimen: Nasal Mucosa; Nasal Swab  Result Value Ref Range Status   MRSA by PCR Next Gen NOT DETECTED NOT DETECTED Final    Comment: (NOTE) The GeneXpert MRSA Assay (FDA approved for NASAL specimens only), is one component of a comprehensive MRSA colonization surveillance program. It is not intended to diagnose MRSA infection nor to guide or monitor treatment for MRSA infections. Test performance is not FDA approved in patients less than 70 years old. Performed at [facility], [address], [place], [place] [address]     RADIOLOGY STUDIES/RESULTS: US  EKG SITE RITE Result Date: 06/11/2024 If Site Rite image not attached, placement could not be confirmed due to current cardiac rhythm.  DG Abd 1 View Result Date:  06/10/2024 CLINICAL DATA:  Small-bowel obstruction EXAM: DG ABDOMEN 1V COMPARISON:  06/09/2024 FINDINGS: Diffuse small bowel dilatation is noted. Contrast is seen within the colon. The stomach is distended with air. No free air is seen. Right hip replacement is again noted. IMPRESSION: Persistent small bowel dilatation consistent with obstruction. Electronically Signed   By: Oneil Evelyne HERO.D.  On: 06/10/2024 23:24   DG CHEST PORT 1 VIEW Result Date: 06/10/2024 CLINICAL DATA:  Short of breath, respiratory distress EXAM: PORTABLE CHEST 1 VIEW COMPARISON:  02/23/2022 FINDINGS: Single frontal view of the chest demonstrates an unremarkable cardiac silhouette. No airspace disease, effusion, or pneumothorax. Prominent gaseous distention of the stomach. IMPRESSION: 1. No acute intrathoracic process. 2. Gaseous distention of the stomach. Electronically Signed   By: Ozell Daring M.D.   On: 06/10/2024 22:44   ECHOCARDIOGRAM COMPLETE Result Date: 06/10/2024    ECHOCARDIOGRAM REPORT   Patient Name:   Darivs Lunden [facility] Date of Exam: 06/10/2024 Medical Rec #:  [id number]          Height:       69.0 in Accession #:    [id number]         Weight:       135.0 lb Date of Birth:  May 07, 1931           BSA:          1.748 m Patient Age:    89 years           BP:           98/65 mmHg Patient Gender: M                  HR:           98 bpm. Exam Location:  Inpatient Procedure: 2D Echo, Cardiac Doppler and Color Doppler (Both Spectral and Color            Flow Doppler were utilized during procedure). Indications:    Atrial Fibrillation I48.91  History:        Patient has prior history of Echocardiogram examinations, most                 recent 05/04/2021. Arrythmias:Atrial Fibrillation,                 Signs/Symptoms:Syncope; Risk Factors:Dyslipidemia.  Sonographer:    Thea Norlander RCS Referring Phys: DONALDA HERO Doralene Glanz IMPRESSIONS  1. Technically difficult study with limited views. Left ventricular ejection fraction, by estimation,  is 50 to 55%. The left ventricle has low normal function. Left ventricular endocardial border not optimally defined to evaluate regional wall motion. There is mild left ventricular hypertrophy. Left ventricular diastolic parameters are indeterminate.  2. Right ventricular systolic function was not well visualized. The right ventricular size is not well visualized.  3. The mitral valve is degenerative. Trivial mitral valve regurgitation.  4. The aortic valve was not well visualized. Aortic valve regurgitation is trivial. No aortic stenosis is present. FINDINGS  Left Ventricle: Left ventricular ejection fraction, by estimation, is 50 to 55%. The left ventricle has low normal function. Left ventricular endocardial border not optimally defined to evaluate regional wall motion. The left ventricular internal cavity  size was small. There is mild left ventricular hypertrophy. Left ventricular diastolic parameters are indeterminate. Right Ventricle: The right ventricular size is not well visualized. No increase in right ventricular wall thickness. Right ventricular systolic function was not well visualized. Left Atrium: Left atrial size was normal in size. Right Atrium: Right atrial size was normal in size. Pericardium: There is no evidence of pericardial effusion. Mitral Valve: The mitral valve is degenerative in appearance. Trivial mitral valve regurgitation. Tricuspid Valve: The tricuspid valve is normal in structure. Tricuspid valve regurgitation is trivial. Aortic Valve: The aortic valve was not well visualized. Aortic valve regurgitation is trivial. No aortic stenosis is present. Aortic  valve peak gradient measures 5.8 mmHg. Pulmonic Valve: The pulmonic valve was not well visualized. Pulmonic valve regurgitation is not visualized. Aorta: The aortic root is normal in size and structure. IAS/Shunts: The interatrial septum was not well visualized.  LEFT VENTRICLE PLAX 2D LVIDd:         2.00 cm   Diastology LVIDs:          1.40 cm   LV e' medial:    7.62 cm/s LV PW:         1.10 cm   LV E/e' medial:  13.1 LV IVS:        1.00 cm   LV e' lateral:   11.40 cm/s LVOT diam:     2.00 cm   LV E/e' lateral: 8.8 LV SV:         43 LV SV Index:   25 LVOT Area:     3.14 cm  RIGHT VENTRICLE RV S prime:     12.00 cm/s TAPSE (M-mode): 1.8 cm LEFT ATRIUM           Index        RIGHT ATRIUM           Index LA diam:      3.00 cm 1.72 cm/m   RA Area:     13.90 cm LA Vol (A2C): 20.3 ml 11.61 ml/m  RA Volume:   28.90 ml  16.53 ml/m LA Vol (A4C): 22.7 ml 12.98 ml/m  AORTIC VALVE AV Area (Vmax): 2.01 cm AV Vmax:        120.00 cm/s AV Peak Grad:   5.8 mmHg LVOT Vmax:      76.63 cm/s LVOT Vmean:     58.967 cm/s LVOT VTI:       0.136 m  AORTA Ao Root diam: 3.60 cm MITRAL VALVE MV Area (PHT): 4.15 cm     SHUNTS MV Decel Time: 183 msec     Systemic VTI:  0.14 m MV E velocity: 100.00 cm/s  Systemic Diam: 2.00 cm MV A velocity: 59.20 cm/s MV E/A ratio:  1.69 Lonni Nanas MD Electronically signed by Lonni Nanas MD Signature Date/Time: 06/10/2024/1:06:18 PM    Final    DG Abd Portable 1V Result Date: 06/09/2024 CLINICAL DATA:  Nausea and vomiting. EXAM: PORTABLE ABDOMEN - 1 VIEW COMPARISON:  June 08, 2024. FINDINGS: Continued small bowel dilatation is noted without colonic dilatation. Contrast and stool is noted in nondilated colon. These findings are concerning for distal small bowel obstruction. IMPRESSION: Continued small bowel dilatation is noted concerning for distal small bowel obstruction. Electronically Signed   By: Lynwood Landy Raddle M.D.   On: 06/09/2024 17:39     LOS: 6 days   Donalda Applebaum, MD  [facility]    To contact the attending provider between 7A-7P or the covering provider during after hours 7P-7A, please log into the web site [email or web address] and access using universal [place] password for that web site. If you do not have the password, please call the hospital operator.  06/11/2024, 10:55  AM

## 2024-06-11 NOTE — Progress Notes (Signed)
@  2055- patient bp 98/79(85), HR sustaining in between 118-140 as well as not able to give schedule metoprolol  25mg . Made the MD Sundil aware. Received the order for LR bolus 1L, midodrine  10mg , albumin  25g, and compazine  5MG  iv  @2200 - Patient sats ranging between 84-87%3LNC increased to 4L to 6HFNC. Made respiratory therapist and MD aware. Assessed the pt on bedside, placed the order for DG chest and abdomen . Increased the Cardizem  drip to 7.5 to 10 to 63ml/hr as HR running 120-150 and sats 84-87% in 12HFNC. So, changed to 60L HHFNC with Fi02  90% and gave iv lasix  20mg   @0000 - started on amiodarone  drip @33 .44ml/hr after bolus.Placed the NG TUBE with low intermittent suction and 1800cc brown/burgundy drain out. Patient confronted some relief.  @0030 - Heparin  gtt stopped and gave iv protonix

## 2024-06-11 NOTE — Progress Notes (Signed)
 Asked to assist in placing NGT. 18Fr NGT placed in L nare to 67cm. 1800cc brown/burgundy drainage suctioned out. Gastroocult ordered. Please call RRT if further assistance needed.

## 2024-06-11 NOTE — Progress Notes (Signed)
   06/10/24 2245  Assess: MEWS Score  Temp 98.2 F (36.8 C)  BP 114/78  MAP (mmHg) 86  Pulse Rate (!) 151  ECG Heart Rate (!) 152  Resp (!) 32  Level of Consciousness Alert  O2 Device HFNC  O2 Flow Rate (L/min) 12 L/min  Assess: MEWS Score  MEWS Temp 0  MEWS Systolic 0  MEWS Pulse 3  MEWS RR 2  MEWS LOC 0  MEWS Score 5  MEWS Score Color Red  Assess: if the MEWS score is Yellow or Red  Were vital signs accurate and taken at a resting state? Yes  Does the patient meet 2 or more of the SIRS criteria? Yes  Does the patient have a confirmed or suspected source of infection? No  MEWS guidelines implemented  Yes, red  Treat  MEWS Interventions Considered administering scheduled or prn medications/treatments as ordered  Take Vital Signs  Increase Vital Sign Frequency  Red: Q1hr x2, continue Q4hrs until patient remains green for 12hrs  Escalate  MEWS: Escalate Red: Discuss with charge nurse and notify provider. Consider notifying RRT. If remains red for 2 hours consider need for higher level of care  Notify: Charge Nurse/RN  Name of Charge Nurse/RN Notified [facility]  Provider Notification  Provider Name/Title MD Sundil  Date Provider Notified 06/10/24  Time Provider Notified 2245  Method of Notification Page (seure chat)  Notification Reason Other (Comment) (HR 120-150, Sats 85-87%)  Provider response At bedside;See new orders  Date of Provider Response 06/10/24  Time of Provider Response 2246  Assess: SIRS CRITERIA  SIRS Temperature  0  SIRS Respirations  1  SIRS Pulse 1  SIRS WBC 0  SIRS Score Sum  2

## 2024-06-11 NOTE — Plan of Care (Signed)
  Problem: Education: Goal: Knowledge of General Education information will improve Description: Including pain rating scale, medication(s)/side effects and non-pharmacologic comfort measures Outcome: Progressing   Problem: Clinical Measurements: Goal: Cardiovascular complication will be avoided Outcome: Progressing   Problem: Elimination: Goal: Will not experience complications related to bowel motility Outcome: Progressing   Problem: Elimination: Goal: Will not experience complications related to urinary retention Outcome: Progressing   Problem: Pain Managment: Goal: General experience of comfort will improve and/or be controlled Outcome: Progressing   Problem: Safety: Goal: Ability to remain free from injury will improve Outcome: Progressing   Problem: Cardiac: Goal: Ability to achieve and maintain adequate cardiopulmonary perfusion will improve Outcome: Progressing

## 2024-06-11 NOTE — Progress Notes (Addendum)
 PHARMACY - TOTAL PARENTERAL NUTRITION CONSULT NOTE  Indication: Small bowel obstruction  Patient Measurements: Height: 5' 8 (172.7 cm) Weight: 64.6 kg (142 lb 6.7 oz) IBW/kg (Calculated) : 68.4 TPN AdjBW (KG): 64.6 Body mass index is 21.65 kg/m.  Assessment:  89 YOM presented on 9/8 with constipation, found to have a SBO.  PMH significant for inguinal hernia post repair and SBR in 2022.  Patient was started on CLD on 9/11 after NGT removal and then advanced to FLD on 9/12 from which he vomited.  Patient was hypotensive and in Afib on 9/13, requiring transfer to the ICU.  NGT reinserted and diet changed back to NPO due to aspiration.  Patient has inadequate oral intake since admission and Pharmacy consulted to manage TPN for SBO.  He might require surgery and is at risk for refeeding.  Glucose / Insulin : no hx DM - glucose < 180 Electrolytes: low Na/CL, others WNL (K and Phos low normal) Renal: SCr < 1, BUN 24 Hepatic: LFTs WNL, tbili 1.4, albumin  3.9 on admit Intake / Output; MIVF: (Lasix  20mg  IV x 1 on 9/13) GI Imaging: none since TPN initiation GI Surgeries / Procedures: none since TPN initiation  Central access: PICC ordered for 06/11/24 TPN start date: 06/11/24  Nutritional Goals: Goal TPN rate is 70 mL/hr provides 85g AA, 235g CHO, 49g ILE and 1627 kCal per day  RD Estimated Needs Total Energy Estimated Needs: 1600-1800 kcal/d Total Protein Estimated Needs: 80-95g/d Total Fluid Estimated Needs: 1.8-2L/d  Current Nutrition:  NPO  Plan:  Start TPN at 67mL/hr at 1800 (goal rate 70 ml/hr) Electrolytes in TPN: Na 167mEq/L, K 3mEq/L, Ca 1mEq/L, Mg 65mEq/L, Phos 20mmol/L, Cl:Ac 2:1 Add standard MVI and trace elements to TPN Change SSI to sensitive SSI Q4H Thiamine  100mg  IV daily x 5 doses Mag sulfate 2gm IV x 1 and KCL x 2 unrs per MD KPhos 10mmmol IV x 1 D5LR at 75 ml/hr x 1 day per MD Monitor TPN labs on Mon/Thurs - labs in AM  Priseis Cratty D. Lendell, PharmD, BCPS,  BCCCP 06/11/2024, 12:16 PM

## 2024-06-11 NOTE — Consult Note (Addendum)
 Cardiology Consultation   Patient ID: Mcclain Shall [facility] MRN: [id number]; DOB: 1931/07/12  Admit date: 06/05/2024 Date of Consult: 06/11/2024  PCP:  Kip Righter, MD   [facility] Providers Cardiologist:  Dr. Jeffrie   Patient Profile: Coyle Stordahl is a 89 y.o. 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, ascending aortic aneurysm, HLD, Hx of inguinal hernia s/p repair in 2022, Hx of bowel resection in 2022 who is being seen 06/11/2024 for the evaluation of A-fib with RVR at the request of Dr. Raenelle.  History of Present Illness: Mr. Zayed was last seen in heart care OV 12/06/2023 by Jackee Alberts, NP for 1 year follow-up. Doing well from cardiac standpoint without any cardiac complaints.  EKG showed NSR with PVCs.  No med changes.  Continued on Eliquis  5 mg twice daily, Cardizem  30 mg as needed, Lipitor 10 mg daily  Presented to ED 9/8 for abdominal pain and constipation.   EKG 9/8 showed A-fib with RVR, HR 157.  CT abdomen/pelvis 9/8 showed high-grade SBO SBO protocol x-ray 9/8 showed no passage of contrast into colon yet Echo 9/13 was technically difficult with limited views showed EF 50 to 55%, mild LVH, trivial MV regurgitation, trivial AV regurgitation, RV was not well-visualized. (EF 55 to 60% in 2018, EF 60 to 65% in 2022)  Admitted for SBO and A-fib with RVR complicated by AKI.  He was started on Cardizem  drip and IV heparin  drip.  Noted rate controlled then Oral BB was started 2 days ago and in an attempt wean CCB drip.   NGT was removed after 4 days on 9/13 evening and general surgery advance diet, however patient developed N/V, BP dropped to 98/79, and abd x-ray 9/13 showed redevelopment of SBO.  Patient developed acute hypoxic failure and A-fib with RVR.  After placing the NG tube 1.8 L of dark brown fluid came up and occult blood test on GI secretions were positive.  So heparin  drip was held since 11 PM with stable Hgb  13-14.  RN notified that patient was spitting up some dried blood mixed with sputum that they suspect from GI source.  Last night maxed out on Cardizem  drip 15 mg with persistent A-fib RVR HR up to 150s.  Cardizem  was DC'd due to low BP.  Switch to Amio bolus and HR 120-130s range.  Initially required high flow O2 to maintain to 85-88 improved to 94% and continued heated high flow O2.  In setting of persistent hypotension received 2L LR bolus, maintenance fluid LR with D5W.  Also treated with albumin  25 g,  midodrine , Lasix  IV 20 mg X1, potassium and mag supplement. Midodrine  was discontinued this a.m.  Continued to hold heparin  drip in the setting of concern for GI bleed.  Continue Amio drip.  CXR 9/13 showed gaseous distention of the stomach but no acute processes. Consulted PCCM, [facility], general surgery, and palliative care. Transferred to ICU today with repeat CXR 9/14 now showing Aspiration Pneumonia.   Most recent labs this a.m. showed K 3.4, CR 0.91, Mg 1.6, Tn 19, lactic acid WNL 1.3, WBC 17.6, stable Hgb 13.1, TSH/T4 WNL  On interview, patient very drowsy and falling asleep. Patient denies any palpitations, chest pain, Shortness of breath, edema, orthopnea, PND, syncope prior to ED arrival.  Patient has never been able to feel palpitations with the A-fib.  Patient still denies feeling any palpitations during hospital course.  Reported that Shortness of breath developed  while in hospital but has significantly improved.  Patient lives at home with his wife and is able to care for both of them.  Patient is able to walk in a grocery store without any exertional concerns.  Notes daily medication compliance.  Past Medical History:  Diagnosis Date   Astigmatism    Basal cell carcinoma    Inguinal hernia    bilateral   Macular degeneration, age related, nonexudative    bilateral   SVT (supraventricular tachycardia) (HCC)     Past Surgical History:  Procedure Laterality Date   APPENDECTOMY      age 60   BOWEL RESECTION  05/09/2021   Procedure: SMALL BOWEL RESECTION;  Surgeon: Ebbie Cough, MD;  Location: [facility] OR;  Service: General;;   CATARACT EXTRACTION, BILATERAL     HERNIA REPAIR     RIH   INGUINAL HERNIA REPAIR Right 05/02/2021   Procedure: REPAIR  INCARCERATED INGUINAL HERNIA  WITH MESH;  Surgeon: Paola Dreama SAILOR, MD;  Location: [facility] OR;  Service: General;  Laterality: Right;   JOINT REPLACEMENT Right    RTHA   LAPAROSCOPY N/A 05/09/2021   Procedure: LAPAROSCOPY DIAGNOSTIC;  Surgeon: Ebbie Cough, MD;  Location: [facility] OR;  Service: General;  Laterality: N/A;   LAPAROTOMY  05/09/2021   Procedure: EXPLORATORY LAPAROTOMY;  Surgeon: Ebbie Cough, MD;  Location: [facility] OR;  Service: General;;   TONSILLECTOMY       Home Medications:  Prior to Admission medications   Medication Sig Start Date End Date Taking? Authorizing Provider  apixaban  (ELIQUIS ) 5 MG TABS tablet Take 1 tablet (5 mg total) by mouth 2 (two) times daily. 06/25/21  Yes Jeffrie Oneil BROCKS, MD  atorvastatin  (LIPITOR) 10 MG tablet Take 10 mg by mouth at bedtime. 08/14/21  Yes [provider]  Cholecalciferol  (VITAMIN D3) 50 MCG (2000 UT) TABS Take 2,000 Units by mouth daily.   Yes [provider]  diltiazem  (CARDIZEM ) 30 MG tablet Take 30 mg by mouth daily as needed (for blood pressure and/or chest pain).   Yes [provider]  ipratropium (ATROVENT ) 0.03 % nasal spray Place 2 sprays into both nostrils every 12 (twelve) hours. Patient taking differently: Place 2 sprays into both nostrils 2 (two) times daily as needed for rhinitis. 01/11/24  Yes Desai, Nikita S, MD  Multiple Vitamin (MULTIVITAMIN WITH MINERALS) TABS tablet Take 1 tablet by mouth daily with breakfast.   Yes [provider]  Multiple Vitamins-Minerals (PRESERVISION AREDS 2) CAPS Take 1 capsule by mouth in the morning and at bedtime.   Yes [provider]  polyethylene glycol (MIRALAX  / GLYCOLAX ) 17 g packet  Take 17 g by mouth as needed for mild constipation or moderate constipation.   Yes [provider]  psyllium (METAMUCIL) 58.6 % packet Take 1 packet by mouth daily as needed (for constipation).   Yes [provider]  VITAMIN E PO Take 1 capsule by mouth daily.    Yes [provider]  pantoprazole  (PROTONIX ) 40 MG tablet Take 40 mg by mouth as needed (indigestion heartburn or acid reflux). Patient not taking: Reported on 06/06/2024    [provider]    Scheduled Meds:  Influenza vac split trivalent PF  0.5 mL Intramuscular Tomorrow-1000   insulin  aspart  2-6 Units Subcutaneous Q4H   ipratropium  2 spray Each Nare BID   ipratropium  0.5 mg Nebulization Q6H   metoprolol  tartrate  2.5 mg Intravenous Q6H   pantoprazole  (PROTONIX ) IV  40 mg Intravenous  Q12H   sodium chloride  flush  3 mL Intravenous Q12H   Continuous Infusions:  amiodarone  30 mg/hr (06/11/24 0630)   cefTRIAXone (ROCEPHIN)  IV     dextrose  5% lactated ringers  125 mL/hr at 06/11/24 0659   heparin  Stopped (06/11/24 0030)   magnesium  sulfate bolus IVPB 2 g (06/11/24 0647)   potassium chloride  10 mEq (06/11/24 0639)   PRN Meds: HYDROmorphone  (DILAUDID ) injection, menthol , [DISCONTINUED] ondansetron  **OR** ondansetron  (ZOFRAN ) IV, prochlorperazine   Allergies:   No Known Allergies  Social History:   Social History   Socioeconomic History   Marital status: Married    Spouse name: Not on file   Number of children: Not on file   Years of education: Not on file   Highest education level: Not on file  Occupational History   Not on file  Tobacco Use   Smoking status: Former    Current packs/day: 0.00    Average packs/day: 1 pack/day for 10.0 years (10.0 ttl pk-yrs)    Types: Cigarettes    Start date: 55    Quit date: 1968    Years since quitting: 57.7   Smokeless tobacco: Never  Vaping Use   Vaping status: Never Used  Substance and Sexual Activity   Alcohol use: No   Drug use: No    Sexual activity: Not on file  Other Topics Concern   Not on file  Social History Narrative   Not on file   Family History:   Family History  Problem Relation Age of Onset   Heart disease Father    Heart attack Father    Heart attack Son    Sleep apnea Son      ROS:  Please see the history of present illness.  All other ROS reviewed and negative.     Physical Exam/Data: Vitals:   06/11/24 0345 06/11/24 0400 06/11/24 0600 06/11/24 0700  BP: 108/79 99/68 96/68  95/70  Pulse:  (!) 121 (!) 138 (!) 123  Resp:  (!) 23 (!) 25 (!) 24  Temp:      TempSrc:      SpO2:  94% 92%   Weight:        Intake/Output Summary (Last 24 hours) at 06/11/2024 0738 Last data filed at 06/11/2024 0530 Gross per 24 hour  Intake --  Output 4150 ml  Net -4150 ml      06/05/2024    5:05 PM 01/13/2024    2:05 PM 01/11/2024    2:13 PM  Last 3 Weights  Weight (lbs) 135 lb 145 lb 144 lb  Weight (kg) 61.236 kg 65.772 kg 65.318 kg     Body mass index is 19.94 kg/m.  General:  Well nourished, well developed, in no acute distress; NGT in place.  HEENT: normal Neck: no JVD Vascular: No carotid bruits; Distal pulses 2+ bilaterally Cardiac:  normal S1, S2; irreg irreg; no murmur  Lungs:  clear to auscultation bilaterally, no wheezing, rhonchi or rales  Abd: soft, nontender, no hepatomegaly  Ext: no edema Musculoskeletal:  No deformities, BUE and BLE strength normal and equal Skin: warm and dry  Neuro:  CNs 2-12 intact, no focal abnormalities noted Psych:  Normal affect   EKG:  The EKG was personally reviewed and demonstrates: 9/8 showed A-fib with RVR, HR 157 (11/2023 EKG showed NSR with PVCs) Telemetry:  Telemetry was personally reviewed and demonstrates:  Afib with RVR, HR 110-140  Relevant CV Studies: ECHO IMPRESSIONS  06/10/2024  1. Technically difficult study with limited views.  Left ventricular  ejection fraction, by estimation, is 50 to 55%. The left ventricle has low  normal function. Left  ventricular endocardial border not optimally defined  to evaluate regional wall motion.  There is mild left ventricular hypertrophy. Left ventricular diastolic  parameters are indeterminate.   2. Right ventricular systolic function was not well visualized. The right  ventricular size is not well visualized.   3. The mitral valve is degenerative. Trivial mitral valve regurgitation.   4. The aortic valve was not well visualized. Aortic valve regurgitation  is trivial. No aortic stenosis is present.    Laboratory Data: High Sensitivity Troponin:  No results for input(s): TROPONINIHS in the last 720 hours.   Chemistry Recent Labs  Lab 06/10/24 0349 06/10/24 2251 06/11/24 0417  NA 135 132* 135  K 3.9 3.9 3.4*  CL 97* 96* 95*  CO2 27 22 27   GLUCOSE 114* 126* 105*  BUN 25* 26* 26*  CREATININE 0.80 0.80 0.91  CALCIUM  8.8* 8.8* 8.9  MG 1.9 1.8 1.6*  GFRNONAA >60 >60 >60  ANIONGAP 11 14 13     Recent Labs  Lab 06/05/24 1241  PROT 7.9  ALBUMIN  3.9  AST 37  ALT 22  ALKPHOS 50  BILITOT 1.4*   Lipids No results for input(s): CHOL, TRIG, HDL, LABVLDL, LDLCALC, CHOLHDL in the last 168 hours.  Hematology Recent Labs  Lab 06/10/24 0349 06/10/24 2251 06/11/24 0417  WBC 11.0* 14.1* 17.6*  RBC 4.14* 4.52 4.19*  HGB 13.2 14.1 13.1  HCT 38.5* 42.4 38.8*  MCV 93.0 93.8 92.6  MCH 31.9 31.2 31.3  MCHC 34.3 33.3 33.8  RDW 12.9 12.8 12.6  PLT 192 231 188   Thyroid  No results for input(s): TSH, FREET4 in the last 168 hours.  BNPNo results for input(s): BNP, PROBNP in the last 168 hours.  DDimer No results for input(s): DDIMER in the last 168 hours.  Radiology/Studies:  US  EKG SITE RITE Result Date: 06/11/2024 If Site Rite image not attached, placement could not be confirmed due to current cardiac rhythm.  DG Abd 1 View Result Date: 06/10/2024 CLINICAL DATA:  Small-bowel obstruction EXAM: DG ABDOMEN 1V COMPARISON:  06/09/2024 FINDINGS: Diffuse small bowel  dilatation is noted. Contrast is seen within the colon. The stomach is distended with air. No free air is seen. Right hip replacement is again noted. IMPRESSION: Persistent small bowel dilatation consistent with obstruction. Electronically Signed   By: Oneil Devonshire M.D.   On: 06/10/2024 23:24   DG CHEST PORT 1 VIEW Result Date: 06/10/2024 CLINICAL DATA:  Short of breath, respiratory distress EXAM: PORTABLE CHEST 1 VIEW COMPARISON:  02/23/2022 FINDINGS: Single frontal view of the chest demonstrates an unremarkable cardiac silhouette. No airspace disease, effusion, or pneumothorax. Prominent gaseous distention of the stomach. IMPRESSION: 1. No acute intrathoracic process. 2. Gaseous distention of the stomach. Electronically Signed   By: Ozell Daring M.D.   On: 06/10/2024 22:44   DG Abd Portable 1V Result Date: 06/09/2024 CLINICAL DATA:  Nausea and vomiting. EXAM: PORTABLE ABDOMEN - 1 VIEW COMPARISON:  June 08, 2024. FINDINGS: Continued small bowel dilatation is noted without colonic dilatation. Contrast and stool is noted in nondilated colon. These findings are concerning for distal small bowel obstruction. IMPRESSION: Continued small bowel dilatation is noted concerning for distal small bowel obstruction. Electronically Signed   By: Lynwood Landy Raddle M.D.   On: 06/09/2024 17:39   DG Abd Portable 1V Result Date: 06/08/2024 CLINICAL DATA:  89 year old male with small-bowel obstruction  on CT 06/05/2024 related to ventral abdominal adhesions. EXAM: PORTABLE ABDOMEN - 1 VIEW COMPARISON:  Abdominal radiographs yesterday and earlier. FINDINGS: Portable AP supine views at 0722 hours. Stable enteric tube terminating in the stomach. Oral contrast now in nondilated right and transverse colon. But ongoing gas distended mid and lower abdominal small bowel loops. Gas pattern only mildly improved from the scout view 06/05/2024. Stable lung bases. Stable visualized osseous structures. Spine degeneration and scoliosis.  Right bipolar hip arthroplasty. IMPRESSION: 1. Partial small bowel obstruction. Oral contrast now in the colon but unresolved gas distended mid and lower abdominal small bowel loops, mildly improved since CT. 2. Stable enteric tube terminating in the proximal stomach. Electronically Signed   By: VEAR Hurst M.D.   On: 06/08/2024 10:31    Assessment and Plan: A-fib with RVR Hx of paroxysmal atrial fibrillation EKG 9/8 showed A-fib with RVR, HR 157 (11/2023 EKG showed NSR with PVCs) Tele: afib, HR 110-140 with PVC, mainly in 110-120's Echo 9/13 was technically difficult with limited views showed EF 50 to 55%, mild LVH, trivial MV regurgitation, trivial AV regurgitation, RV was not well-visualized. (EF 55 to 60% in 2018, EF 60 to 65% in 2022) K 3.4, Mg 1.6, TSH/T4 WNL, TN 19. Tn elevated most likely secondary to tachycardia induced demand ischemia.  Denies any palpitations.  Continue to hold Heparin  with concern for GI bleed. Once cleared restart heparin  gtt to reduce stroke risk.  With HR 110-120, less concern for aggressive rate control. Continue on Amio IV infusion. Per Dr. Michele, would focus on management of SBO and Aspiration pneumonia.   Most likely secondary to acute hypoxic failure, aspiration pneumonia and electrolyte imbalances, SBO.  Hypotension  In setting of persistent hypotension, last night received 2L LR bolus, maintenance fluid LR with D5W.  Also treated with albumin  25 g,  midodrine  10 mg.  Midodrine  was discontinued this a.m. Lactic acid WNL 1.3, WBC 17.6 Management per primary.  Hypokalemia/hypomagnesemia Likely secondary to NG to suction This am K 3.4, Mg 1.6. Treated with potassium and mag supplements. Continue to monitor  AKI Initial 1.81, now 0.91, Resolved  HLD Home med: Lipitor 10 mg daily Holding Lipitor until resuming oral intake.  Ascending aortic aneurysm CT scan for ascending aortic aneurysm on 11/19/2023 that showed stable aneurysm at 4.5-4.6 cm that was  reassuring.  Continue to monitor in outpatient  SBO  Aspiration Pneumonia  Managed by primary team, GI, general surgery, and pulmonary  Risk Assessment/Risk Scores:  CHA2DS2-VASc Score = 3  This indicates a 3.2% annual risk of stroke. The patient's score is based upon: CHF History: 0 HTN History: 0 Diabetes History: 0 Stroke History: 0 Vascular Disease History: 1 Age Score: 2 Gender Score: 0   For questions or updates, please contact [facility] Please consult [email or web address] for contact info under   Signed, Lorette CINDERELLA Kapur, PA-C  06/11/2024 7:38 AM   ADDENDUM:   Patient seen and examined with Ms.Dunlap.  I personally taken a history, examined the patient, reviewed relevant notes,  laboratory data / imaging studies.  I performed a substantive portion of this encounter and formulated the important aspects of the plan.  I agree with the APP's note, impression, and recommendations; however, I have edited the note to reflect changes or salient points.   Patient hospitalized for abdominal pain/constipation and was noted to have small bowel obstruction and A-fib with RVR on presentation. Has been managed by hospitalist service for the last 6 days. Cardiology consulted today  06/11/2024 for A-fib with RVR management He was placed on a Cardizem  drip for rate control strategy and IV heparin  drip for thromboembolic prophylaxis.  Patient has history of paroxysmal atrial fibrillation on Eliquis  5 mg p.o. twice daily along with Cardizem  30 mg p.o. daily and as needed basis.  At the time of evaluation patient is in the medical ICU bed 5. Denies anginal chest pain or heart failure symptoms. As mentioned above his NG tube was removed with the impression that his small bowel obstruction had resolved.  However, yesterday he became hypotensive, hypoxic, and NG tube was reinserted.  The gastric contents were checked which were occult positive and therefore heparin  drip was discontinued  due to concern for bleed.  Hemoglobin has remained stable.  He has not required transfusions.  Due to hypotension Cardizem  drip was discontinued and was started on amiodarone .  He is currently on IV metoprolol  for rate control strategy as well.  At the time of the evaluation his ventricular rates are well-controlled between 110-120 mmHg. He is hypotensive with a systolic blood pressures between 88-91 mmHg at the time of the evaluation. Currently being treated for aspiration pneumonia, hypoxia on supplemental oxygen, A-fib with RVR, and SBO.  PHYSICAL EXAM: Today's Vitals   06/11/24 1145 06/11/24 1146 06/11/24 1200 06/11/24 1215  BP: 97/70  91/67 (!) 83/59  Pulse: (!) 123  (!) 121 (!) 119  Resp: 20  19 (!) 29  Temp:  98.4 F (36.9 C)    TempSrc:  Oral    SpO2: 97%  91% 91%  Weight:      Height:      PainSc:   0-No pain    Body mass index is 21.65 kg/m.   Net IO Since Admission: -7,708.39 mL [06/11/24 1318]  Filed Weights   06/05/24 1705 06/11/24 0740  Weight: 61.2 kg 64.6 kg    General: Age-appropriate, acutely ill, no acute distress HEENT: Normocephalic, atraumatic, trachea midline, NG tube in place, high flow nasal cannula oxygen, no JVP Lungs: Clear to auscultation bilaterally no wheezes rales or rhonchi's Heart: Irregularly irregular, tachycardic, no murmurs rubs or gallops appreciated secondary to tachycardia Abdomen: Soft, nontender, nondistended, hypoactive bowel sounds Extremities: No swelling, warm to touch Psych: In good spirits, cooperative  EKG: (personally reviewed by me) June 06, 2024: Atrial fibrillation with rapid ventricular rate, 157 bpm.  Telemetry: (personally reviewed by me) Atrial fibrillation: With episodes of controlled and rapid ventricular rate.  No sustained arrhythmias   Echo  06/10/2024  1. Technically difficult study with limited views. Left ventricular  ejection fraction, by estimation, is 50 to 55%. The left ventricle has low   normal function. Left ventricular endocardial border not optimally defined  to evaluate regional wall motion.  There is mild left ventricular hypertrophy. Left ventricular diastolic  parameters are indeterminate.   2. Right ventricular systolic function was not well visualized. The right  ventricular size is not well visualized.   3. The mitral valve is degenerative. Trivial mitral valve regurgitation.   4. The aortic valve was not well visualized. Aortic valve regurgitation  is trivial. No aortic stenosis is present.   Impression & Recommendations: :  A-fib with RVR with known history of paroxysmal atrial fibrillation Likely precipitated by small bowel obstruction and exacerbated by underlying aspiration pneumonia Was on Cardizem  drip now weaned off due to hypotension. Agree with IV amiodarone  for rate and rhythm control strategy. If amiodarone  is used long-term need thyroid , liver, lung function.  Would avoid long-term use  if possible. Was on IV heparin  drip for thromboembolic prophylaxis, currently being held as there is concern that GI contents are coffee-ground occult testing and gastric contents were positive.  Patient needs to be cleared for restarting anticoagulation for thromboembolic prophylaxis. Agree with IV metoprolol  -hold if MAP less than 70.  May use digoxin if truly needed for better rate control strategy, reluctant for now given his advanced age and multiple electrolyte abnormalities. Recommend aggressive electrolyte replacement of potassium and magnesium  supplementation given his GI losses Continue telemetry. Monitor for now  Small bowel obstruction: Currently has an NG tube in place. Being followed by medicine and surgery team  Aspiration pneumonia Currently on antibiotics And aspiration precautions.  Hypotension. Multifactorial: A-fib with RVR,?  Early sepsis, GI losses, dehydration LVEF is low normal -May use IV fluids as necessary. Infectious/sepsis workup  per primary and ICU. Blood pressures should improve as ventricular rate are better controlled. Cardizem  recently discontinued  Hypokalemia. Hypomagnesemia. Aggressive electrolyte replacements keeping potassium of 4 and magnesium  of 2.  Hyperlipidemia: Restart lipid-lowering agents once able to tolerate diet  Ascending aortic aneurysm: Needs outpatient follow-up regularly  Plan of care discussed with nursing staff after rounds.  No family at bedside.  Further recommendations to follow as the case evolves.   This note was created using a voice recognition software as a result there may be grammatical errors inadvertently enclosed that do not reflect the nature of this encounter. Every attempt is made to correct such errors.   Madonna Michele HAS, [facility]  A Division of [facility] [address], [place], [place] [address]  Pager: [phone] Office: [phone] 06/11/2024 1:18 PM

## 2024-06-11 NOTE — Progress Notes (Signed)
 06/11/2024 Back in RVR 140-150 Otherwise doing okay. DC metop as this bottoms out his BP (although helps HR) Rebolus amio, check CVP to get idea if we are still dry; do note increased opacities on CXR but this is expected after aspiration event this am Noted that he is DNR from discussing with palliative.  Rolan Sharps MD PCCM

## 2024-06-11 NOTE — Progress Notes (Signed)
 Peripherally Inserted Central Catheter Placement  The IV Nurse has discussed with the patient and/or persons authorized to consent for the patient, the purpose of this procedure and the potential benefits and risks involved with this procedure.  The benefits include less needle sticks, lab draws from the catheter, and the patient may be discharged home with the catheter. Risks include, but not limited to, infection, bleeding, blood clot (thrombus formation), and puncture of an artery; nerve damage and irregular heartbeat and possibility to perform a PICC exchange if needed/ordered by physician.  Alternatives to this procedure were also discussed.  Bard Power PICC patient education guide, fact sheet on infection prevention and patient information card has been provided to patient /or left at bedside.    PICC Placement Documentation  PICC Triple Lumen 06/11/24 Right Basilic 38 cm 0 cm (Active)  Indication for Insertion or Continuance of Line Administration of hyperosmolar/irritating solutions (i.e. TPN, Vancomycin, etc.);Limited venous access - need for IV therapy >5 days (PICC only) 06/11/24 1553  Exposed Catheter (cm) 0 cm 06/11/24 1553  Site Assessment Clean, Dry, Intact 06/11/24 1553  Lumen #1 Status Flushed;Saline locked;Blood return noted 06/11/24 1553  Lumen #2 Status Flushed;Saline locked;Blood return noted 06/11/24 1553  Lumen #3 Status Flushed;Saline locked;Blood return noted 06/11/24 1553  Dressing Type Transparent;Securing device 06/11/24 1553  Dressing Status Antimicrobial disc/dressing in place;Clean, Dry, Intact 06/11/24 1553  Line Care Connections checked and tightened 06/11/24 1553  Line Adjustment (NICU/IV Team Only) No 06/11/24 1553  Dressing Intervention New dressing 06/11/24 1553  Dressing Change Due 06/18/24 06/11/24 1553       Gary Rice 06/11/2024, 3:55 PM

## 2024-06-11 NOTE — Plan of Care (Addendum)
 This patient has a complicated history he was admitted for SBO and A-fib RVR.SABRA NGT was removed yesterday evening and general surgery advancing diet however patient developed nausea vomiting and x-ray showed redevelopment of SBO, chest x-ray showed gastric distention pushing against left lung halfway through, developed acute hypoxic respiratory failure, developed A-fib RVR.  After replacing the NG tube back 1.8 L of dark brown-colored fluid came up and the occult blood test was positive. The heparin  drip on hold since 11 PM.... Fortunately hemoglobin is stable around 13-14 range... Got a message from patient RN now that patient is spitting up some dried blood mixed with the sputum.. I believe it is from GI source. Last night maxed on Cardizem  drip 15 mg still continue to have A-fib RVR heart rate up to 150s range and switch to amnio bolus and drip heart rate currently 120-130s range. Initially required high flow oxygen maintained 85-88 currently improved to 94%.  -Continue amiodarone  drip.  Continue to hold heparin  drip in the setting of concern for GI bleed - Continue IV Protonix  40 mg twice daily. - Continue heated high flow oxygen O2 sat 93 to 94%. - In the setting of persistent hypotension received 2 L of LR bolus so far and starting maintenance fluid LR with D5W 125 cc/h.  Give albumin  total 25 g overnight and midodrine  10 mg. -Will obtain GI angio GI bleed study.   Consulted PCCM to evaluate patient. Consulted [facility] to evaluate patient. Consulted general surgery to request to evaluate patient again.   Makylee Sanborn, MD [facility] 06/11/2024, 6:26 AM  .

## 2024-06-11 NOTE — Progress Notes (Signed)
 Assisted with pt tx from 5W21 to 3M05. Pt transferred on NRB with RR RN x 2, RT, and 5W NT.

## 2024-06-11 NOTE — Plan of Care (Signed)
  Problem: Education: Goal: Knowledge of General Education information will improve Description: Including pain rating scale, medication(s)/side effects and non-pharmacologic comfort measures Outcome: Progressing   Problem: Clinical Measurements: Goal: Ability to maintain clinical measurements within normal limits will improve Outcome: Progressing Goal: Will remain free from infection Outcome: Progressing Goal: Diagnostic test results will improve Outcome: Progressing Goal: Respiratory complications will improve Outcome: Progressing   Problem: Coping: Goal: Level of anxiety will decrease Outcome: Progressing   Problem: Elimination: Goal: Will not experience complications related to bowel motility Outcome: Progressing   Problem: Pain Managment: Goal: General experience of comfort will improve and/or be controlled Outcome: Progressing   Problem: Skin Integrity: Goal: Risk for impaired skin integrity will decrease Outcome: Progressing

## 2024-06-11 NOTE — Plan of Care (Signed)
  Problem: Education: Goal: Knowledge of General Education information will improve Description: Including pain rating scale, medication(s)/side effects and non-pharmacologic comfort measures Outcome: Not Progressing   Problem: Health Behavior/Discharge Planning: Goal: Ability to manage health-related needs will improve Outcome: Not Progressing   Problem: Clinical Measurements: Goal: Ability to maintain clinical measurements within normal limits will improve Outcome: Not Progressing Goal: Will remain free from infection Outcome: Not Progressing Goal: Diagnostic test results will improve Outcome: Not Progressing Goal: Respiratory complications will improve Outcome: Not Progressing Goal: Cardiovascular complication will be avoided Outcome: Not Progressing   Problem: Activity: Goal: Risk for activity intolerance will decrease Outcome: Not Progressing   Problem: Nutrition: Goal: Adequate nutrition will be maintained Outcome: Not Progressing   Problem: Coping: Goal: Level of anxiety will decrease Outcome: Not Progressing   Problem: Elimination: Goal: Will not experience complications related to bowel motility Outcome: Not Progressing Goal: Will not experience complications related to urinary retention Outcome: Not Progressing   Problem: Pain Managment: Goal: General experience of comfort will improve and/or be controlled Outcome: Not Progressing   Problem: Safety: Goal: Ability to remain free from injury will improve Outcome: Not Progressing   Problem: Skin Integrity: Goal: Risk for impaired skin integrity will decrease Outcome: Not Progressing   Problem: Education: Goal: Knowledge of disease or condition will improve Outcome: Not Progressing Goal: Understanding of medication regimen will improve Outcome: Not Progressing Goal: Individualized Educational Video(s) Outcome: Not Progressing   Problem: Activity: Goal: Ability to tolerate increased activity will  improve Outcome: Not Progressing   Problem: Cardiac: Goal: Ability to achieve and maintain adequate cardiopulmonary perfusion will improve Outcome: Not Progressing   Problem: Health Behavior/Discharge Planning: Goal: Ability to safely manage health-related needs after discharge will improve Outcome: Not Progressing

## 2024-06-11 NOTE — Consult Note (Signed)
 Consultation Note Date: 06/11/2024 at 1200  Patient Name: Gary Rice [facility]  DOB: [birth year]  MRN: [id number]  Age / Sex: 89 y.o., male  PCP: Kip Righter, MD Referring Physician: Raenelle Donalda HERO, MD  HPI/Patient Profile: 89 y.o. 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  Upon presentation to ED, patient found to be in A-fib with RVR.  CT revealed high-grade small bowel obstruction, inguinal hernia containing nonobstructive sigmoid colon, and probable cholelithiasis.  Patient is being treated for small bowel obstruction, A-fib with RVR, hyperlipidemia, and AKI.  General surgery was consulted.  No emergent surgical needs.  NPO, IVF, and NGT to LIWS.  Plan remains to continue withg conservative measures and watchful waiting.  If patient fails to improve he may require laparotomy, LOA, hernia repair.  PMT was consulted to support patient with goals of care discussions.   Clinical Assessment and Goals of Care: Extensive chart review completed prior to meeting patient including labs, vital signs, imaging, progress notes, orders, and available advanced directive documents from current and previous encounters. I then met with patient at bedside to discuss diagnosis prognosis, GOC, EOL wishes, disposition and options.  I introduced Palliative Medicine as specialized medical care for people living with serious illness. It focuses on providing relief from the symptoms and stress of a serious illness. The goal is to improve quality of life for both the  patient and the family.  We discussed a brief life review of the patient.  Patient has been married for 71 years.  He is originally from [place]  but moved to [place]  approximately 25 years ago.  He has no living children and grandchildren with whom he is not close.  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  He enjoys watching [place]  [employer] football.  As far as functional and nutritional status patient was independent with a ADLs and able to ambulate independently with use of cane PTA.  We discussed patient's current illness and what it means in the larger context of patient's on-going co-morbidities.  Extensive education provided on small bowel obstruction, NG tube for bowel rest, IV fluids for hydration, and potential for additional abdominal surgery should these conservative measures fail.  Discussed rapid response, aspiration, IV antibiotics, and use of high flow nasal cannula for supplemental oxygen support.  I attempted to elicit values and goals of care important to the patient.  Patient states he would like to get better so that he can return home to be with his wife.  He shares she has some cognitive decline and relies on him heavily to take care of her.  However, he knows that he needs to get well, get over this aspiration, in order to be  able to return home.  In light of patient commenting that his wife has cognitive decline, we discussed patient's next of kin decision maker.  In the event that patient is unable to speak for himself, he names his wife in addition to his brother-in-law Garrel as his next of kin decision makers.  He shares he would not want them to make a decision jointly.  He shares that if his wife is in her right state of mind then he would want her to make decisions for him.  However, if she is unable, he names his brother-in-law Garrel.  During our discussion, patient's neighbor Charlena called.  Patient endorses that time can be kept abreast of patient's  current medical situation but that ultimately patient's wife and brother-in-law would be medical decision makers.  With patient's permission, brief medical update given to his neighbor Charlena over the phone.  Advance directives, concepts specific to code status, artificial feeding and hydration, and rehospitalization were considered and discussed. He shares that he and his wife have had discussions several times in the past in regards to boundaries of medical care.  He shares that he would never want to be placed on a ventilator.  Additionally, he shares he would never be accepting of artificial means to keep him alive.  Discussed full code versus DNR status.  Patient endorses he would like to be a DNR with limited interventions, ensuring that he is never placed on a ventilator.  Discussed doing everything up until that point to support him and medically optimize him without intubating.  Patient was in agreement.  CODE STATUS changed to DNR.  Attending, CCM, and RN notified.   Discussed with patient/family the importance of continued conversation with family and the medical providers regarding overall plan of care and treatment options, ensuring decisions are within the context of the patient's values and GOCs.    Questions and concerns were addressed.  Patient was encouraged to call with questions or concerns.  PMT contact info given.  Patient was in agreement for PMT to continue to follow.  Change to plan of care is that patient is now a DNR with limited interventions.  No other changes at this time.  Primary Decision Maker PATIENT  Physical Exam Vitals reviewed.  Constitutional:      General: He is not in acute distress.    Appearance: He is normal weight.  HENT:     Head: Normocephalic.     Nose: Nose normal.     Mouth/Throat:     Mouth: Mucous membranes are moist.  Eyes:     Pupils: Pupils are equal, round, and reactive to light.  Pulmonary:     Effort: Pulmonary effort is normal.   Abdominal:     Palpations: Abdomen is soft.  Skin:    General: Skin is warm and dry.     Coloration: Skin is pale.  Neurological:     Mental Status: He is alert and oriented to person, place, and time.  Psychiatric:        Mood and Affect: Mood normal.        Behavior: Behavior normal.     Palliative Assessment/Data: 60%     Thank you for this consult. Palliative medicine will continue to follow and assist holistically.   Visit interaction includes: Detailed review of medical records (labs, imaging, vital signs), medically appropriate exam (mental status, respiratory, cardiac, skin), discussed with treatment team, counseling and educating patient, family and staff, documenting clinical information, medication management and  coordination of care.  Signed by: Lamarr Gunner, DNP, FNP-BC Palliative Medicine   Please contact Palliative Medicine Team providers via [facility] for questions and concerns.

## 2024-06-11 NOTE — Consult Note (Addendum)
 NAME:  Gary Rice, MRN:  [id number], DOB:  04/11/1931, LOS: 6 ADMISSION DATE:  06/05/2024, CONSULTATION DATE:  06/11/2024 REFERRING MD: Sundil, Subrina, MD, CHIEF COMPLAINT:  Afib/RVR and resp failure   History of Present Illness:  A 89 year old male with hx of SVT, Atrial fibrillation (on Eliquis ), ascending aortic aneurysm, macular degeneration, dyslipidemia, and chronic hoarseness who presented to the ED with constipation 06/05/2024 for the last 4-5 days. He was dx with AKI, Afib/RVR and high-grade SBO and NGT was inserted for 4 days before removal and resuming clear liquid diet. Surgery was consulted on admission. He was started on Cardizem  drip and IV heparin  drip. Oral BB was started 2 days ago in an attempt to wean CCB drip.  Developed nausea and vomiting last night after eating and his BP dropped 98/79(85) on Cardizem  drip, HR 120-125 Afib,  and scheduled metoprolol  25mg  was not given due to soft BP. Received the order for LR bolus 1L, midodrine  10mg , albumin  25g, and compazine  5MG  iv. He became SOB and hypoxic, his O2 requirement increased 2 L to 60 L/min HFNC, SpO2 94%. Given Lasix  20 mg IV X1.  Made NPO again and NGT was inserted again, with low intermittent suction, and 1800 cc brown/burgundy drain out (H/O positive). Given IV Protonix  and IV Heparin  was d/c midnight.   Cardizem  was d/c due to low BP and Amiodarone  bolus/drip started. PCCM was called to transfer to ICU. He has mild SOB, no cough or wheezing. Denied abd pain, CP, and f/c/r.  Chest x-ray no evidence of pulmonary vascular congestion.  Chest x-ray showing gaseous distention of stomach pushing the left sided lung. X-ray abdomen showed distended stomach, persistent small bowel dilation consistent with obstruction.  Pertinent  Medical History  SVT, Atrial fibrillation (on Eliquis ), ascending aortic aneurysm, macular degeneration, dyslipidemia, chronic hoarseness, open appendectomy, open left inguinal hernia repair with mesh,  exploratory laparotomy with small bowel resection 2022.  Significant Hospital Events: Including procedures, antibiotic start and stop dates in addition to other pertinent events   9/8: admission 9/14: PCCM is consulted, transferred to ICU  Interim History / Subjective:   Objective    Blood pressure 96/68, pulse (!) 138, temperature 99.4 F (37.4 C), temperature source Oral, resp. rate (!) 25, weight 61.2 kg, SpO2 92%.    FiO2 (%):  [85 %-90 %] 90 %   Intake/Output Summary (Last 24 hours) at 06/11/2024 0648 Last data filed at 06/11/2024 0530 Gross per 24 hour  Intake --  Output 4150 ml  Net -4150 ml   Filed Weights   06/05/24 1705  Weight: 61.2 kg    Examination: General: alert, oriented x4, and comfortable. On HFNC 60 L/min, SpO2 90%, SpO2 94%  HENT: PERL, normal pharynx and oral mucosa. No LNE or thyromegaly. No JVD Lungs: symmetrical air entry bilaterally. Basal crackles. No wheezing Cardiovascular: NL S1/S2. No m/g/r Abdomen: no distension or tenderness. Periumbilical hernia Extremities: no edema. Symmetrical  Neuro: nonfocal   Resolved problem list   Assessment and Plan  Afib/RVR, Echo EF 50%, with possible diastolic dysfunction  -Transfer to ICU -Consult cardiology -Replace Mg and K -IV metoprolol  scheduled  -Amiodarone  drip  -Trop -LA -TFT -Hold IV Heparin  for now -Serial Hb/Hct  SBO with NGT output positive H/O -Surgery already on board -Dietitian consult for TPN -NPO, bowel rest -PICC line -IVF -LA -Palliative care consult   Acute hypoxic resp failure Possible aspiration -Resp Cx -Rocephin -Atrovent  scheduled  -d/c Xopenex  -I/S -HOB elevation -aspiration precautions  AKI: resolved -Monitor  Dyslipidemia -Hold meds  HypoK/hypoMg -Replace  -Monitor electrolytes   Full code  Labs   CBC: Recent Labs  Lab 06/05/24 1241 06/06/24 0530 06/08/24 0250 06/09/24 0331 06/10/24 0349 06/10/24 2251 06/11/24 0417  WBC 8.7   < > 12.0*  12.2* 11.0* 14.1* 17.6*  NEUTROABS 6.1  --   --   --   --   --   --   HGB 16.0   < > 13.8 14.2 13.2 14.1 13.1  HCT 45.8   < > 41.0 41.9 38.5* 42.4 38.8*  MCV 91.1   < > 94.0 93.5 93.0 93.8 92.6  PLT 238   < > 181 211 192 231 188   < > = values in this interval not displayed.    Basic Metabolic Panel: Recent Labs  Lab 06/07/24 0432 06/08/24 0250 06/09/24 0331 06/10/24 0349 06/10/24 2251 06/11/24 0417  NA 139 139 137 135 132* 135  K 3.4* 3.7 3.7 3.9 3.9 3.4*  CL 95* 97* 100 97* 96* 95*  CO2 28 29 26 27 22 27   GLUCOSE 105* 101* 111* 114* 126* 105*  BUN 48* 28* 21 25* 26* 26*  CREATININE 1.14 0.85 0.78 0.80 0.80 0.91  CALCIUM  9.4 9.0 8.9 8.8* 8.8* 8.9  MG 2.3 2.1 1.9 1.9 1.8 1.6*  PHOS 2.3* 1.9* 2.3* 2.9  --  2.7   GFR: Estimated Creatinine Clearance: 45.8 mL/min (by C-G formula based on SCr of 0.91 mg/dL). Recent Labs  Lab 06/09/24 0331 06/10/24 0349 06/10/24 2251 06/11/24 0417  WBC 12.2* 11.0* 14.1* 17.6*    Liver Function Tests: Recent Labs  Lab 06/05/24 1241  AST 37  ALT 22  ALKPHOS 50  BILITOT 1.4*  PROT 7.9  ALBUMIN  3.9   Recent Labs  Lab 06/05/24 1241  LIPASE 18   No results for input(s): AMMONIA in the last 168 hours.  ABG    Component Value Date/Time   HCO3 31.3 (H) 06/10/2024 2251   TCO2 26 05/02/2021 1814   O2SAT 88.7 06/10/2024 2251     Coagulation Profile: Recent Labs  Lab 06/05/24 1241  INR 1.4*    Cardiac Enzymes: No results for input(s): CKTOTAL, CKMB, CKMBINDEX, TROPONINI in the last 168 hours.  HbA1C: Hgb A1c MFr Bld  Date/Time Value Ref Range Status  05/11/2021 12:32 AM 5.8 (H) 4.8 - 5.6 % Final    Comment:    (NOTE) Pre diabetes:          5.7%-6.4%  Diabetes:              >6.4%  Glycemic control for   <7.0% adults with diabetes     CBG: No results for input(s): GLUCAP in the last 168 hours.  Review of Systems:   Review of Systems  Constitutional:  Positive for malaise/fatigue. Negative for  chills, diaphoresis and fever.  Respiratory:  Positive for shortness of breath. Negative for cough, hemoptysis, sputum production and wheezing.   Cardiovascular:  Positive for palpitations. Negative for chest pain, orthopnea and leg swelling.  Gastrointestinal:  Positive for constipation, nausea and vomiting. Negative for abdominal pain and diarrhea.  Skin:  Negative for itching and rash.     Past Medical History:  He,  has a past medical history of Astigmatism, Basal cell carcinoma, Inguinal hernia, Macular degeneration, age related, nonexudative, and SVT (supraventricular tachycardia) (HCC).   Surgical History:   Past Surgical History:  Procedure Laterality Date   APPENDECTOMY     age 79  BOWEL RESECTION  05/09/2021   Procedure: SMALL BOWEL RESECTION;  Surgeon: Ebbie Cough, MD;  Location: [facility] OR;  Service: General;;   CATARACT EXTRACTION, BILATERAL     HERNIA REPAIR     RIH   INGUINAL HERNIA REPAIR Right 05/02/2021   Procedure: REPAIR  INCARCERATED INGUINAL HERNIA  WITH MESH;  Surgeon: Paola Dreama SAILOR, MD;  Location: [facility] OR;  Service: General;  Laterality: Right;   JOINT REPLACEMENT Right    RTHA   LAPAROSCOPY N/A 05/09/2021   Procedure: LAPAROSCOPY DIAGNOSTIC;  Surgeon: Ebbie Cough, MD;  Location: [facility] OR;  Service: General;  Laterality: N/A;   LAPAROTOMY  05/09/2021   Procedure: EXPLORATORY LAPAROTOMY;  Surgeon: Ebbie Cough, MD;  Location: [facility] OR;  Service: General;;   TONSILLECTOMY       Social History:   reports that he quit smoking about 57 years ago. His smoking use included cigarettes. He started smoking about 67 years ago. He has a 10 pack-year smoking history. He has never used smokeless tobacco. He reports that he does not drink alcohol and does not use drugs.  Patient lives with his wife and is independent for ADLs.  Family History:  His family history includes Heart attack in his father and son; Heart disease in his father; Sleep apnea in his son.    Allergies No Known Allergies   Home Medications  Prior to Admission medications   Medication Sig Start Date End Date Taking? Authorizing Provider  apixaban  (ELIQUIS ) 5 MG TABS tablet Take 1 tablet (5 mg total) by mouth 2 (two) times daily. 06/25/21  Yes Jeffrie Oneil BROCKS, MD  atorvastatin  (LIPITOR) 10 MG tablet Take 10 mg by mouth at bedtime. 08/14/21  Yes [provider]  Cholecalciferol  (VITAMIN D3) 50 MCG (2000 UT) TABS Take 2,000 Units by mouth daily.   Yes [provider]  diltiazem  (CARDIZEM ) 30 MG tablet Take 30 mg by mouth daily as needed (for blood pressure and/or chest pain).   Yes [provider]  ipratropium (ATROVENT ) 0.03 % nasal spray Place 2 sprays into both nostrils every 12 (twelve) hours. Patient taking differently: Place 2 sprays into both nostrils 2 (two) times daily as needed for rhinitis. 01/11/24  Yes Desai, Nikita S, MD  Multiple Vitamin (MULTIVITAMIN WITH MINERALS) TABS tablet Take 1 tablet by mouth daily with breakfast.   Yes [provider]  Multiple Vitamins-Minerals (PRESERVISION AREDS 2) CAPS Take 1 capsule by mouth in the morning and at bedtime.   Yes [provider]  polyethylene glycol (MIRALAX  / GLYCOLAX ) 17 g packet Take 17 g by mouth as needed for mild constipation or moderate constipation.   Yes [provider]  psyllium (METAMUCIL) 58.6 % packet Take 1 packet by mouth daily as needed (for constipation).   Yes [provider]  VITAMIN E PO Take 1 capsule by mouth daily.    Yes [provider]  pantoprazole  (PROTONIX ) 40 MG tablet Take 40 mg by mouth as needed (indigestion heartburn or acid reflux). Patient not taking: Reported on 06/06/2024    [provider]     Critical care time: 54 min    Mancel Ply, MD [facility] Pager: see AMION

## 2024-06-11 NOTE — Progress Notes (Signed)
      Chief Complaint/Subjective: NG replaced, high output, increased oxygen requirements concern for aspiration, transferred to ICU  Objective: Vital signs in last 24 hours: Temp:  [98.1 F (36.7 C)-99.6 F (37.6 C)] 98.3 F (36.8 C) (09/14 0740) Pulse Rate:  [89-153] 136 (09/14 0800) Resp:  [15-32] 15 (09/14 0800) BP: (85-127)/(62-92) 91/66 (09/14 0800) SpO2:  [85 %-98 %] 89 % (09/14 0800) FiO2 (%):  [50 %-90 %] 50 % (09/14 0753) Weight:  [64.6 kg] 64.6 kg (09/14 0740) Last BM Date : 06/09/24 Intake/Output from previous day: 09/13 0701 - 09/14 0700 In: -  Out: 4150 [Urine:1300; Emesis/NG output:2850]  PE: Gen: NAD Resp: nonlabored Card: tachycardic Abd: soft ventral hernia, nontender  Lab Results:  Recent Labs    06/10/24 2251 06/11/24 0417  WBC 14.1* 17.6*  HGB 14.1 13.1  HCT 42.4 38.8*  PLT 231 188   Recent Labs    06/10/24 2251 06/11/24 0417  NA 132* 135  K 3.9 3.4*  CL 96* 95*  CO2 22 27  GLUCOSE 126* 105*  BUN 26* 26*  CREATININE 0.80 0.91  CALCIUM  8.8* 8.9   No results for input(s): LABPROT, INR in the last 72 hours.    Component Value Date/Time   NA 135 06/11/2024 0417   NA 143 04/23/2022 1454   K 3.4 (L) 06/11/2024 0417   CL 95 (L) 06/11/2024 0417   CO2 27 06/11/2024 0417   GLUCOSE 105 (H) 06/11/2024 0417   BUN 26 (H) 06/11/2024 0417   BUN 23 04/23/2022 1454   CREATININE 0.91 06/11/2024 0417   CALCIUM  8.9 06/11/2024 0417   PROT 7.9 06/05/2024 1241   ALBUMIN  3.9 06/05/2024 1241   AST 37 06/05/2024 1241   ALT 22 06/05/2024 1241   ALKPHOS 50 06/05/2024 1241   BILITOT 1.4 (H) 06/05/2024 1241   GFRNONAA >60 06/11/2024 0417   GFRAA  08/14/2007 0312    >60        The eGFR has been calculated using the MDRD equation. This calculation has not been validated in all clinical    Assessment/Plan SBO   - Unfortunately the bowel obstruction does not appear to be resolving - Now in ICU dealing with aspiration pneumonia - Once  respiratory status stabilizes or improves, we should probably consider surgery to address this obstruction, for now continue NG - Heparin  ggt as he may need surgery, avoid oral anticoagulation   LOS: 6 days   I reviewed last 24 h vitals and pain scores, last 48 h intake and output, last 24 h labs and trends, and last 24 h imaging results.  This care required moderate level of medical decision making.   Deward PARAS [facility] 06/11/2024, 8:26 AM Please see Amion for pager number during day hours 7:00am-4:30pm or 7:00am -11:30am on weekends

## 2024-06-11 NOTE — Progress Notes (Signed)
 PHARMACY - ANTICOAGULATION CONSULT NOTE  Pharmacy Consult for heparin  Indication: atrial fibrillation  No Known Allergies  Patient Measurements: Weight: 61.2 kg (135 lb)  Vital Signs: Temp: 99.4 F (37.4 C) (09/14 0325) Temp Source: Oral (09/14 0325) BP: 96/68 (09/14 0600) Pulse Rate: 138 (09/14 0600)  Labs: Recent Labs    06/08/24 2130 06/09/24 0331 06/09/24 0914 06/10/24 0349 06/10/24 1723 06/10/24 2251 06/11/24 0417  HGB  --    < >  --  13.2  --  14.1 13.1  HCT  --    < >  --  38.5*  --  42.4 38.8*  PLT  --    < >  --  192  --  231 188  APTT 77*  --  90* 107*  --   --   --   HEPARINUNFRC 0.94*  --   --  0.85* 0.64  --  0.39  CREATININE  --    < >  --  0.80  --  0.80 0.91   < > = values in this interval not displayed.    Estimated Creatinine Clearance: 45.8 mL/min (by C-G formula based on SCr of 0.91 mg/dL).   Medical History: Past Medical History:  Diagnosis Date   Astigmatism    Basal cell carcinoma    Inguinal hernia    bilateral   Macular degeneration, age related, nonexudative    bilateral   SVT (supraventricular tachycardia) (HCC)      Assessment: 89 yo M presenting with abdominal pain, constipation, in Afib RVR, hx of afib on Eliquis  PTA with last dose 9/8 AM, on hold for SBO eval.  Pharmacy is consulted for heparin  dosing.   9/14 AM update:  -Last night/early this AM, patient was hypotensive and in afib. He ended up having to have a NG tube placed. When suctioning, burgundy/brown fluid came out and he was coughing up blood. Heparin  infusion held at 0030. Will continue to hold per RN. -Heparin  level at goal at 0.39 on 950 units/hr -Hgb and PLT WNL  Goal of Therapy:  Heparin  level 0.3-0.7 units/ml aPTT 66-102 seconds Monitor platelets by anticoagulation protocol: Yes   Plan:  -Continue to hold heparin  infusion -Follow-up when to restart heparin  -Monitor Hgb, PLT  Izetta Carl, PharmD PGY1 Pharmacy Resident [facility]  06/11/2024 7:05 AM

## 2024-06-12 DIAGNOSIS — J9601 Acute respiratory failure with hypoxia: Secondary | ICD-10-CM

## 2024-06-12 DIAGNOSIS — N179 Acute kidney failure, unspecified: Secondary | ICD-10-CM | POA: Diagnosis not present

## 2024-06-12 DIAGNOSIS — I480 Paroxysmal atrial fibrillation: Secondary | ICD-10-CM | POA: Diagnosis not present

## 2024-06-12 DIAGNOSIS — I959 Hypotension, unspecified: Secondary | ICD-10-CM | POA: Diagnosis not present

## 2024-06-12 DIAGNOSIS — J690 Pneumonitis due to inhalation of food and vomit: Secondary | ICD-10-CM | POA: Diagnosis not present

## 2024-06-12 DIAGNOSIS — I4891 Unspecified atrial fibrillation: Secondary | ICD-10-CM | POA: Diagnosis not present

## 2024-06-12 DIAGNOSIS — K56609 Unspecified intestinal obstruction, unspecified as to partial versus complete obstruction: Secondary | ICD-10-CM | POA: Diagnosis not present

## 2024-06-12 LAB — CBC
HCT: 36.3 % — ABNORMAL LOW (ref 39.0–52.0)
Hemoglobin: 12.4 g/dL — ABNORMAL LOW (ref 13.0–17.0)
MCH: 31.8 pg (ref 26.0–34.0)
MCHC: 34.2 g/dL (ref 30.0–36.0)
MCV: 93.1 fL (ref 80.0–100.0)
Platelets: 156 K/uL (ref 150–400)
RBC: 3.9 MIL/uL — ABNORMAL LOW (ref 4.22–5.81)
RDW: 12.9 % (ref 11.5–15.5)
WBC: 15.9 K/uL — ABNORMAL HIGH (ref 4.0–10.5)
nRBC: 0 % (ref 0.0–0.2)

## 2024-06-12 LAB — COMPREHENSIVE METABOLIC PANEL WITH GFR
ALT: 12 U/L (ref 0–44)
AST: 14 U/L — ABNORMAL LOW (ref 15–41)
Albumin: 2.4 g/dL — ABNORMAL LOW (ref 3.5–5.0)
Alkaline Phosphatase: 37 U/L — ABNORMAL LOW (ref 38–126)
Anion gap: 8 (ref 5–15)
BUN: 19 mg/dL (ref 8–23)
CO2: 27 mmol/L (ref 22–32)
Calcium: 8.4 mg/dL — ABNORMAL LOW (ref 8.9–10.3)
Chloride: 99 mmol/L (ref 98–111)
Creatinine, Ser: 0.85 mg/dL (ref 0.61–1.24)
GFR, Estimated: >60 mL/min (ref >60)
Glucose, Bld: 128 mg/dL — ABNORMAL HIGH (ref 70–99)
Potassium: 3.4 mmol/L — ABNORMAL LOW (ref 3.5–5.1)
Sodium: 134 mmol/L — ABNORMAL LOW (ref 135–145)
Total Bilirubin: 0.3 mg/dL (ref 0.0–1.2)
Total Protein: 5.8 g/dL — ABNORMAL LOW (ref 6.5–8.1)

## 2024-06-12 LAB — MAGNESIUM: Magnesium: 1.8 mg/dL (ref 1.7–2.4)

## 2024-06-12 LAB — PROTIME-INR
INR: 1.2 (ref 0.8–1.2)
Prothrombin Time: 15.9 s — ABNORMAL HIGH (ref 11.4–15.2)

## 2024-06-12 LAB — PHOSPHORUS: Phosphorus: 2.1 mg/dL — ABNORMAL LOW (ref 2.5–4.6)

## 2024-06-12 LAB — CALCIUM, IONIZED: Calcium, Ionized, Serum: 5.1 mg/dL (ref 4.5–5.6)

## 2024-06-12 LAB — GLUCOSE, CAPILLARY
Glucose-Capillary: 118 mg/dL — ABNORMAL HIGH (ref 70–99)
Glucose-Capillary: 118 mg/dL — ABNORMAL HIGH (ref 70–99)
Glucose-Capillary: 125 mg/dL — ABNORMAL HIGH (ref 70–99)
Glucose-Capillary: 129 mg/dL — ABNORMAL HIGH (ref 70–99)
Glucose-Capillary: 139 mg/dL — ABNORMAL HIGH (ref 70–99)
Glucose-Capillary: 158 mg/dL — ABNORMAL HIGH (ref 70–99)

## 2024-06-12 LAB — TRIGLYCERIDES: Triglycerides: 45 mg/dL (ref <150)

## 2024-06-12 MED ORDER — SODIUM CHLORIDE 0.9 % IV SOLN: INTRAVENOUS | Status: AC

## 2024-06-12 MED ORDER — HYALURONIDASE HUMAN 150 UNIT/ML IJ SOLN
150.0000 [IU] | Freq: Once | INTRAMUSCULAR | Status: AC
  Administered 2024-06-12: 150 [IU] via SUBCUTANEOUS
  Filled 2024-06-12 (×2): qty 1

## 2024-06-12 MED ORDER — POTASSIUM CHLORIDE 10 MEQ/100ML IV SOLN
10.0000 meq | INTRAVENOUS | Status: AC
  Administered 2024-06-12 (×3): 10 meq via INTRAVENOUS
  Filled 2024-06-12: qty 100

## 2024-06-12 MED ORDER — POTASSIUM PHOSPHATES 15 MMOLE/5ML IV SOLN
15.0000 mmol | Freq: Once | INTRAVENOUS | Status: AC
  Administered 2024-06-12: 15 mmol via INTRAVENOUS
  Filled 2024-06-12: qty 5

## 2024-06-12 MED ORDER — TRAVASOL 10 % IV SOLN
INTRAVENOUS | Status: AC
  Filled 2024-06-12: qty 606

## 2024-06-12 MED ORDER — ORAL CARE MOUTH RINSE: 15.0000 mL | OROMUCOSAL | Status: DC | PRN

## 2024-06-12 MED ORDER — ALBUMIN HUMAN 25 % IV SOLN
50.0000 g | Freq: Once | INTRAVENOUS | Status: AC
  Administered 2024-06-12: 50 g via INTRAVENOUS
  Filled 2024-06-12: qty 200

## 2024-06-12 MED ORDER — MAGNESIUM SULFATE 2 GM/50ML IV SOLN
2.0000 g | Freq: Once | INTRAVENOUS | Status: AC
Start: 2024-06-12 — End: 2024-06-12
  Administered 2024-06-12: 2 g via INTRAVENOUS
  Filled 2024-06-12: qty 50

## 2024-06-12 MED ORDER — POTASSIUM CHLORIDE 10 MEQ/50ML IV SOLN
10.0000 meq | Freq: Once | INTRAVENOUS | Status: AC
  Administered 2024-06-12: 10 meq via INTRAVENOUS
  Filled 2024-06-12: qty 50

## 2024-06-12 MED ORDER — DIGOXIN 0.25 MG/ML IJ SOLN
0.1250 mg | Freq: Every day | INTRAMUSCULAR | Status: AC
  Administered 2024-06-12: 0.125 mg via INTRAVENOUS
  Filled 2024-06-12: qty 0.5

## 2024-06-12 NOTE — TOC Progression Note (Signed)
 Transition of Care [facility]) - Progression Note    Patient Details  Name: Gary Rice MRN: [id number] Date of Birth: 06-Sep-1931  Transition of Care [facility] CM/SW Contact  Tom-Johnson, Estefano Victory Daphne, RN Phone Number: 06/12/2024, 1:27 PM  Clinical Narrative:     Patient continues with IV abx, IV Amiodarone , TPN, on NGT to LIWS. Cardiology following for A-Fib. Palliative following for Goals Of Care. Gen sx following, no emergent surgery at this time for SBO.    Patient not Medically ready for discharge.  CM will continue to follow as patient progresses with care towards discharge.          Expected Discharge Plan: Home w Home Health Services Barriers to Discharge: Continued Medical Work up               Expected Discharge Plan and Services   Discharge Planning Services: CM Consult Post Acute Care Choice: Home Health, Durable Medical Equipment Living arrangements for the past 2 months: Single Family Home                                       Social Drivers of Health (SDOH) Interventions SDOH Screenings   Food Insecurity: No Food Insecurity (06/06/2024)  Housing: Low Risk  (06/06/2024)  Transportation Needs: No Transportation Needs (06/06/2024)  Utilities: Not At Risk (06/06/2024)  Depression (PHQ2-9): Low Risk  (06/18/2021)  Social Connections: Moderately Integrated (06/06/2024)  Tobacco Use: Medium Risk (06/05/2024)    Readmission Risk Interventions     No data to display

## 2024-06-12 NOTE — Progress Notes (Signed)
 PHARMACY - TOTAL PARENTERAL NUTRITION CONSULT NOTE  Indication: Small bowel obstruction  Patient Measurements: Height: 5' 8 (172.7 cm) Weight: 66.6 kg (146 lb 13.2 oz) IBW/kg (Calculated) : 68.4 TPN AdjBW (KG): 64.6 Body mass index is 22.32 kg/m.  Assessment:  64 YOM presented on 9/8 with constipation, found to have a SBO.  PMH significant for inguinal hernia post repair and SBR in 2022.  Patient was started on CLD on 9/11 after NGT removal and then advanced to FLD on 9/12 from which he vomited.  Patient was hypotensive and in Afib on 9/13, requiring transfer to the ICU.  NGT reinserted and diet changed back to NPO due to aspiration.  Patient has inadequate oral intake since admission and Pharmacy consulted to manage TPN for SBO.  He might require surgery and is at risk for refeeding.  Glucose / Insulin : no hx DM - glucose < 180 Electrolytes: low Na/CL, K 3.4, Phos 2.1, Mag 1.8 Renal: SCr < 1, BUN 19 Hepatic: LFTs WNL, alk phos 37, albumin  3.9 on admit now 2.4 Intake / Output; MIVF: Emesis 350, UOP 900 GI Imaging: none since TPN initiation GI Surgeries / Procedures: none since TPN initiation  Central access: 06/11/24 TPN start date: 06/11/24  Nutritional Goals: Goal TPN rate is 70 mL/hr provides 85g AA, 235g CHO, 49g ILE and 1627 kCal per day  RD Estimated Needs Total Energy Estimated Needs: 1600-1800 kcal/d Total Protein Estimated Needs: 80-95g/d Total Fluid Estimated Needs: 1.8-2L/d  Current Nutrition:  NPO  Plan:  Increase TPN to 61mL/hr at 1800 (goal rate 70 ml/hr) Electrolytes in TPN: Na 147mEq/L, K 73mEq/L, Ca 60mEq/L, Mg 41mEq/L, Phos 22mmol/L, Cl:Ac 2:1 Add standard MVI and trace elements to TPN Change SSI to sensitive SSI Q4H Thiamine  100mg  IV daily x 5 doses Mag sulfate 2gm IV x 1 and KCL x 2 unrs per MD KPhos 15mmmol IV x 1 KCL 40 meq x 1 Monitor TPN labs on Mon/Thurs - labs in AM  Donny Alert, PharmD, [facility] Clinical Pharmacist Please see AMION for all  Pharmacists' Contact Phone Numbers 06/12/2024, 7:21 AM

## 2024-06-12 NOTE — Progress Notes (Signed)
 Physical Therapy Treatment Patient Details Name: Gary Rice MRN: [id number] DOB: 02-25-1931 Today's Date: 06/12/2024   History of Present Illness Pt is a 89 y.o. M who presents 06/05/2024 with SBO. Significant PMH:  A-fib on Cardizem  and Eliquis , history of SVT, history of inguinal hernia, history of bowel resection in 2022, inguinal hernia repair 2022.    PT Comments  Pt has had a decline in his in medical status and is now in ICU on 20 L O2 via HHFNC. Despite this set back pt is agreeable to working with therapy to get up to standing. Utilized bed egress position to be able to come to standing at the foot of the bed. Pt requires minA for coming to standing on first attempt and is not able to come to fully upright. Pt request to try on his own and is able to come to standing with CGA and is able to stand about ~3 min. Pt left in chair position with RN in room. PT will reassess pt in next session to determine appropriate discharge disposition.     If plan is discharge home, recommend the following: Assistance with cooking/housework;Assist for transportation;Help with stairs or ramp for entrance   Can travel by private vehicle     Yes  Equipment Recommendations  Rollator (4 wheels);Other (comment) (shower chair)       Precautions / Restrictions Precautions Precautions: Fall;Other (comment) Precaution/Restrictions Comments: watch HR & SpO2, NG tube, HHFNC, TPN, Restrictions Weight Bearing Restrictions Per Provider Order: No     Mobility  Bed Mobility Overal bed mobility: Needs Assistance Bed Mobility: Supine to Sit     Supine to sit: Total assist     General bed mobility comments: utilized ICU bed egress position to come to sitting    Transfers Overall transfer level: Needs assistance Equipment used: Rolling walker (2 wheels) (grab bar in bathroom) Transfers: Sit to/from Stand Sit to Stand: Contact guard assist           General transfer comment: on intial attempt  to requiring min A for coming to upright, unable to maintain standing and needing to sit, on second attempt pr is able to push off from the bed and achieve fully upright with contact guard assist, pt able to stand for ~3 min HR increased to          Balance Overall balance assessment: Needs assistance Sitting-balance support: No upper extremity supported, Feet supported Sitting balance-Leahy Scale: Good Sitting balance - Comments: able to reach down to manage shoes with extra effort and time without LOB, supervision for safety; able to reach off COG to perform pericare without LOB   Standing balance support: Bilateral upper extremity supported, During functional activity Standing balance-Leahy Scale: Poor Standing balance comment: benefits from bilateral UE support to steady in standing                            Communication Communication Communication: No apparent difficulties  Cognition Arousal: Alert Behavior During Therapy: WFL for tasks assessed/performed   PT - Cognitive impairments: No apparent impairments                         Following commands: Intact      Cueing Cueing Techniques: Verbal cues, Tactile cues     General Comments General comments (skin integrity, edema, etc.): Pt is on HHFNC 25L O2 90% FiO2 and is able to maintain his SpO2 >90%  O2. HR in 100s in seated, increased to 140s with standing but returns to 110s with return to seated.      Pertinent Vitals/Pain Pain Assessment Pain Assessment: Faces Faces Pain Scale: Hurts a little bit Pain Location: generalized Pain Descriptors / Indicators: Discomfort Pain Intervention(s): Limited activity within patient's tolerance, Monitored during session, Repositioned     PT Goals (current goals can now be found in the care plan section) Acute Rehab PT Goals Patient Stated Goal: get better PT Goal Formulation: With patient Time For Goal Achievement: 06/20/24 Potential to Achieve Goals:  Good Progress towards PT goals: PT to reassess next treatment    Frequency    Min 2X/week       AM-PAC PT 6 Clicks Mobility   Outcome Measure  Help needed turning from your back to your side while in a flat bed without using bedrails?: Total Help needed moving from lying on your back to sitting on the side of a flat bed without using bedrails?: Total Help needed moving to and from a bed to a chair (including a wheelchair)?: A Lot Help needed standing up from a chair using your arms (e.g., wheelchair or bedside chair)?: A Little Help needed to walk in hospital room?: Total Help needed climbing 3-5 steps with a railing? : Total 6 Click Score: 9    End of Session   Activity Tolerance: Patient limited by fatigue;Other (comment) (increased lines and leads) Patient left: in chair;with call bell/phone within reach;with chair alarm set Nurse Communication: Mobility status PT Visit Diagnosis: Unsteadiness on feet (R26.81);Other abnormalities of gait and mobility (R26.89);Muscle weakness (generalized) (M62.81);Difficulty in walking, not elsewhere classified (R26.2)     Time: 8388-8358 PT Time Calculation (min) (ACUTE ONLY): 30 min  Charges:    $Therapeutic Activity: 23-37 mins PT General Charges $$ ACUTE PT VISIT: 1 Visit                     Bethany Hirt B. Fleeta Lapidus PT, DPT Acute Rehabilitation Services Please use secure chat or  Call Office [phone]    Almarie KATHEE Fleeta [facility] 06/12/2024, 5:37 PM

## 2024-06-12 NOTE — Progress Notes (Signed)
 TRH   ROUNDING   NOTE Gary Rice [facility] FMW:[id number]  DOB: May 06, 1931  DOA: 06/05/2024  PCP: Kip Righter, MD  06/12/2024,8:37 AM  LOS: 7 days    Code Status:  full     From:  home    89 yr WM Known chronic A-fib (Since 06/06/2021)CHADVASC >4 on Eliquis , HLD Prior episodic syncope with head injury 02/23/2022 Prior right hip repair Prior SBO status post open bowel resection 05/09/2021 comp created by pelvic abscesses during that hospital stay and pelvic drain placement as well as IR grew Klebsiella Bacteroides and underwent MRCP P as well and was discharged to CIR   9/8 admitted with recurrent SBO A-fib RVR-had AKI additionally-- initially trialed Cardizem , an beta-blocker for rate control 9/14 NG tube removed by general surgery but then developed dry blood with sputum heparin  was held became hypotensive-became distended again, 1800 cc out of brown burgundy succus IV PPI IV heparin  DC and transferred to ICU Echo 50% EF 9/14 AM cardiology and critical care consulted and decompensated     Assessment  & Plan :     bowel obstruction Defer to surgery-needs to wean down off oxygen--needs eventual surgery--- pain control Dilaudid  0.5-1 IV GI protection Protonix  40 IV twice daily Compazine  5 every 6 as needed Continues TPN 50 cc/H-watch LFTs and mag Aspiration 9/14 with likely aspiration pneumonia Hypoxic respiratory failure secondary to aspiration Zosyn  started 9/14 continue at least 5 to 7 days Repeat chest x-ray in a.m.--- follow sputum culture ordered WBC still elevated Pulmonary toilet incentive spirometer bullet Still on heated high flow 40-need to be able to de-escalate further before ability if to undergo surgery Started on saline 75 cc/H Anemia, Hemoccult positive Reported hemoptysis 9/14 when vomiting No report dark tarry stool from RN Hemoccult + 9/14--no BM Not in a state for invasive eval-cannot use heparin  at this time given this in addition to A-fib RVR CHADVASC >4 Not  candidate for heparin  currently stopped on 9/14 2/2?  Hemoptysis Amio gtt. currently at 60-defer to cardiology Asymptomatic hypotension currently CVP last done was 9 MAP is little low today so giving 50 of albumin  starting saline 40 cc/H-did have boluses of fluid 9/14 Electrolyte disturbance secondary to loss of succus Hypokalemia-given 4 runs of potassium-recheck tomorrow Hypomagnesemia given 2 g of mag Hypophosphatemia given 15 mmol of Phos  AKI on admission Improved-fluids/TPN as above Reported 46 mm aortic aneurysm  chronic diastolic heart failure EF 50% this admission -5.98 L   Data Reviewed:    BUN/creatinine 18/0.8 LFTs AST/ALT 14/12 WBC 15 hemoglobin 12 platelet [id number] CXR interval increased bilateral middle  lower lung airspace opacities?  Effusion?  Atelectasis  DVT prophylaxis: SCD  Status is: Inpatient Remains inpatient appropriate because:    Critically ill   Current Dispo: Inpatient     Subjective:   Seen on unit Looks fair conversant reaffirms DNR no significant pain No chest pain No swelling of lower extremities ROM intact    Objective + exam Vitals:   06/12/24 0545 06/12/24 0600 06/12/24 0746 06/12/24 0755  BP: 92/74 100/69    Pulse: (!) 129 (!) 128 (!) 106   Resp: 19 19 (!) 25   Temp:    98.7 F (37.1 C)  TempSrc:    Oral  SpO2: 95% 95% 96%   Weight:      Height:       Filed Weights   06/05/24 1705 06/11/24 0740 06/12/24 0428  Weight: 61.2 kg 64.6 kg 66.6 kg  Examination: EOMI NCAT cachectic Looks much younger than stated age S1-S2 A-fib on monitors Abdomen slight distention succus noted in canister from NG tube Coherent conversant able to straight leg raise bilaterally Power is 5/5  Scheduled Meds:  Chlorhexidine  Gluconate Cloth  6 each Topical Q0600   Influenza vac split trivalent PF  0.5 mL Intramuscular Tomorrow-1000   insulin  aspart  0-9 Units Subcutaneous Q4H   ipratropium  2 spray Each Nare BID   pantoprazole   (PROTONIX ) IV  40 mg Intravenous Q12H   sodium chloride  flush  10-40 mL Intracatheter Q12H   sodium chloride  flush  3 mL Intravenous Q12H   thiamine  (VITAMIN B1) injection  100 mg Intravenous Daily   Continuous Infusions:  amiodarone  60 mg/hr (06/12/24 0600)   magnesium  sulfate bolus IVPB     piperacillin -tazobactam (ZOSYN )  IV 12.5 mL/hr at 06/12/24 0600   potassium chloride      potassium PHOSPHATE  IVPB (in mmol)     TPN ADULT (ION) 30 mL/hr at 06/12/24 0600   TPN ADULT (ION)      Time 85  Colen Grimes, MD  [facility]

## 2024-06-12 NOTE — Progress Notes (Signed)
 Palliative Care Progress Note, Assessment & Plan   Patient Name: Gary Rice [facility]       Date: 06/12/2024 DOB: 1930/11/18  Age: 89 y.o. MRN#: [id number] Attending Physician: Samtani, Jai-Gurmukh, MD Primary Care Physician: Gary Righter, MD Admit Date: 06/05/2024  Subjective: Patient is lying in bed with high flow nasal cannula as well as NG tube to left nare.  He is awake, alert, acknowledges my presence, and is able to make his wishes known.  No family or friends present during my visit.  HPI: 89 y.o. 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   Upon presentation to ED, patient found to be in A-fib with RVR.  CT revealed high-grade small bowel obstruction, inguinal hernia containing nonobstructive sigmoid colon, and probable cholelithiasis.   Patient is being treated for small bowel obstruction, A-fib with RVR, hyperlipidemia, and AKI.   General surgery was consulted.  No emergent surgical needs.  NPO, IVF, and NGT to LIWS.  Plan remains to continue withg conservative measures and watchful waiting.  If patient fails to improve he may require laparotomy, LOA, hernia repair.   PMT was consulted to support patient with goals of care discussions.   Summary of counseling/coordination of care: Extensive chart review completed prior to meeting patient including labs, vital signs, imaging, progress notes, orders, and available advanced directive  documents from current and previous encounters.   After reviewing the patient's chart and assessing the patient at bedside, I spoke with patient in regards to symptom management and goals of care.   Symptoms assessed.  Patient endorses he feels well.  He shares he feels much better than he did 2 days ago.  He has no acute complaints of pain, headache, N/V/D, or other acute issues at this time.  He endorses mild discomfort with NG tube in his throat but also endorses chronic postnasal drip that is irritating to him.  No adjustment to [facility] needed at this time.  I again attempted to discuss boundaries and goals of care with patient.  He shares great concern over his wife having significant cognitive decline.  He is concerned that he is her primary caregiver and that he is unable to care for her at this time.  I attempted to redirect the conversation to patient's medical care.  Education provided that surgery is not emergent but it looks like it might be a real reality.  Discussed significant risks associated with undergoing an additional abdominal surgery at his age with his comorbidities.  He shares he is ready to do it and accepts the risks.  I shared we are attempting to maximize his pulmonary status prior to any surgery.  He shares that he recognizes his respiratory system needs to be stronger in order for him to recover from surgery.    Additionally, discussed that patient remains a DNR with limited interventions.  He again endorses understanding and  remains in agreement to never be placed on a ventilator or accept CPR/ACLS protocol in the event of a cardiopulmonary arrest.  Space and opportunity provided for patient to share his thoughts and emotions.  He is significantly worried about his wife.  Therapeutic silence, active listening, and emotional support provided.  No change to plan of care at this time.  PMT will continue to follow and support.  Physical Exam Vitals reviewed.  Constitutional:       General: He is not in acute distress.    Appearance: He is normal weight.  HENT:     Head: Normocephalic.     Mouth/Throat:     Mouth: Mucous membranes are moist.  Eyes:     Pupils: Pupils are equal, round, and reactive to light.  Cardiovascular:     Rate and Rhythm: Tachycardia present.  Pulmonary:     Effort: Pulmonary effort is normal.  Abdominal:     Palpations: Abdomen is soft.  Skin:    General: Skin is warm and dry.  Neurological:     Mental Status: He is alert and oriented to person, place, and time.  Psychiatric:        Mood and Affect: Mood normal.        Behavior: Behavior normal.             Visit includes: Detailed review of medical records (labs, imaging, vital signs), medically appropriate exam (mental status, respiratory, cardiac, skin), discussed with treatment team, counseling and educating patient, family and staff, documenting clinical information, medication management and coordination of care.  Gary L. Arvid, DNP, FNP-BC Palliative Medicine Team

## 2024-06-12 NOTE — Progress Notes (Signed)
 Progress Note     Subjective: Pt denies abdominal pain currently. Feels like breathing is getting a little better. Very concerned about his wife who he normally cares for at home.   Objective: Vital signs in last 24 hours: Temp:  [97.1 F (36.2 C)-98.8 F (37.1 C)] 98.7 F (37.1 C) (09/15 0755) Pulse Rate:  [102-143] 106 (09/15 0746) Resp:  [13-29] 25 (09/15 0746) BP: (81-141)/(56-104) 100/69 (09/15 0600) SpO2:  [89 %-98 %] 96 % (09/15 0746) FiO2 (%):  [30 %-45 %] 45 % (09/15 0746) Weight:  [66.6 kg] 66.6 kg (09/15 0428) Last BM Date : 06/09/24  Intake/Output from previous day: 09/14 0701 - 09/15 0700 In: 5515 [I.V.:3305.7; NG/GT:30; IV Piggyback:2179.3] Out: 1250 [Urine:900; Emesis/NG output:350] Intake/Output this shift: No intake/output data recorded.  PE: General: pleasant, WD, elderly male who is laying in bed in NAD Heart: irregularly irregular in the 100-120s, BP soft Lungs: normal effort on HFNC at 30 L/min, no wheezing Abd: soft, NT, hernia soft, NGT with bilious fluid with some blood in tubing Psych: A&Ox4 with an appropriate affect.    Lab Results:  Recent Labs    06/11/24 0417 06/11/24 1155 06/12/24 0540  WBC 17.6*  --  15.9*  HGB 13.1 12.6* 12.4*  HCT 38.8* 38.2* 36.3*  PLT 188  --  156   BMET Recent Labs    06/11/24 0854 06/12/24 0540  NA 134* 134*  K 3.5 3.4*  CL 96* 99  CO2 26 27  GLUCOSE 141* 128*  BUN 24* 19  CREATININE 0.90 0.85  CALCIUM  8.7* 8.4*   PT/INR No results for input(s): LABPROT, INR in the last 72 hours. CMP     Component Value Date/Time   NA 134 (L) 06/12/2024 0540   NA 143 04/23/2022 1454   K 3.4 (L) 06/12/2024 0540   CL 99 06/12/2024 0540   CO2 27 06/12/2024 0540   GLUCOSE 128 (H) 06/12/2024 0540   BUN 19 06/12/2024 0540   BUN 23 04/23/2022 1454   CREATININE 0.85 06/12/2024 0540   CALCIUM  8.4 (L) 06/12/2024 0540   PROT 5.8 (L) 06/12/2024 0540   ALBUMIN  2.4 (L) 06/12/2024 0540   AST 14 (L) 06/12/2024  0540   ALT 12 06/12/2024 0540   ALKPHOS 37 (L) 06/12/2024 0540   BILITOT 0.3 06/12/2024 0540   GFRNONAA >60 06/12/2024 0540   GFRAA  08/14/2007 0312    >60        The eGFR has been calculated using the MDRD equation. This calculation has not been validated in all clinical   Lipase     Component Value Date/Time   LIPASE 18 06/05/2024 1241       Studies/Results: DG Chest Port 1 View Result Date: 06/12/2024 CLINICAL DATA:  ARDS. EXAM: PORTABLE CHEST 1 VIEW COMPARISON:  06/10/2024 FINDINGS: The lungs are clear without focal pneumonia, edema, pneumothorax or pleural effusion. Interstitial markings are diffusely coarsened with chronic features. Cardiopericardial silhouette is at upper limits of normal for size. NG tube tip is in the gastric fundus with marked interval decrease in gaseous distention of the stomach. Telemetry leads overlie the chest. IMPRESSION: 1. Chronic interstitial coarsening without acute cardiopulmonary findings. 2. NG tube tip is in the gastric fundus with marked interval decrease in gaseous distention of the stomach. Electronically Signed   By: Camellia Candle M.D.   On: 06/12/2024 07:09   DG CHEST PORT 1 VIEW Result Date: 06/11/2024 CLINICAL DATA:  [id number] Status post PICC central line placement [id number]  EXAM: PORTABLE CHEST 1 VIEW COMPARISON:  Chest x-ray 06/11/2024 8:45 a.m., chest x-ray 06/10/2024 FINDINGS: Interval placement of a right PICC with tip overlying the expected region of the distal superior vena cava. Redemonstration of enteric tube coursing below the hemidiaphragm with tip and side port overlying the expected region of the gastric lumen. The heart and mediastinal contours are unchanged. Interval increase in bilateral mid lower lung zone airspace opacities. Diffuse increased interstitial markings. No pneumothorax. No acute osseous abnormality. IMPRESSION: 1. Interval increase in bilateral mid lower lung zone airspace opacities. Findings suggestive of combination  of pleural effusion and underlying atelectasis or pulmonary disease. 2. Lines and tubes as above. Electronically Signed   By: Morgane  Naveau M.D.   On: 06/11/2024 17:04   US  EKG SITE RITE Result Date: 06/11/2024 If Site Rite image not attached, placement could not be confirmed due to current cardiac rhythm.  DG Abd 1 View Result Date: 06/10/2024 CLINICAL DATA:  Small-bowel obstruction EXAM: DG ABDOMEN 1V COMPARISON:  06/09/2024 FINDINGS: Diffuse small bowel dilatation is noted. Contrast is seen within the colon. The stomach is distended with air. No free air is seen. Right hip replacement is again noted. IMPRESSION: Persistent small bowel dilatation consistent with obstruction. Electronically Signed   By: Oneil Devonshire M.D.   On: 06/10/2024 23:24   DG CHEST PORT 1 VIEW Result Date: 06/10/2024 CLINICAL DATA:  Short of breath, respiratory distress EXAM: PORTABLE CHEST 1 VIEW COMPARISON:  02/23/2022 FINDINGS: Single frontal view of the chest demonstrates an unremarkable cardiac silhouette. No airspace disease, effusion, or pneumothorax. Prominent gaseous distention of the stomach. IMPRESSION: 1. No acute intrathoracic process. 2. Gaseous distention of the stomach. Electronically Signed   By: Ozell Daring M.D.   On: 06/10/2024 22:44   ECHOCARDIOGRAM COMPLETE Result Date: 06/10/2024    ECHOCARDIOGRAM REPORT   Patient Name:   Gary Rice Date of Exam: 06/10/2024 Medical Rec #:  [id number]          Height:       69.0 in Accession #:    [id number]         Weight:       135.0 lb Date of Birth:  1930/12/18           BSA:          1.748 m Patient Age:    89 years           BP:           98/65 mmHg Patient Gender: M                  HR:           98 bpm. Exam Location:  Inpatient Procedure: 2D Echo, Cardiac Doppler and Color Doppler (Both Spectral and Color            Flow Doppler were utilized during procedure). Indications:    Atrial Fibrillation I48.91  History:        Patient has prior history of  Echocardiogram examinations, most                 recent 05/04/2021. Arrythmias:Atrial Fibrillation,                 Signs/Symptoms:Syncope; Risk Factors:Dyslipidemia.  Sonographer:    Thea Norlander RCS Referring Phys: DONALDA HERO GHIMIRE IMPRESSIONS  1. Technically difficult study with limited views. Left ventricular ejection fraction, by estimation, is 50 to 55%. The left ventricle has low normal function. Left ventricular  endocardial border not optimally defined to evaluate regional wall motion. There is mild left ventricular hypertrophy. Left ventricular diastolic parameters are indeterminate.  2. Right ventricular systolic function was not well visualized. The right ventricular size is not well visualized.  3. The mitral valve is degenerative. Trivial mitral valve regurgitation.  4. The aortic valve was not well visualized. Aortic valve regurgitation is trivial. No aortic stenosis is present. FINDINGS  Left Ventricle: Left ventricular ejection fraction, by estimation, is 50 to 55%. The left ventricle has low normal function. Left ventricular endocardial border not optimally defined to evaluate regional wall motion. The left ventricular internal cavity  size was small. There is mild left ventricular hypertrophy. Left ventricular diastolic parameters are indeterminate. Right Ventricle: The right ventricular size is not well visualized. No increase in right ventricular wall thickness. Right ventricular systolic function was not well visualized. Left Atrium: Left atrial size was normal in size. Right Atrium: Right atrial size was normal in size. Pericardium: There is no evidence of pericardial effusion. Mitral Valve: The mitral valve is degenerative in appearance. Trivial mitral valve regurgitation. Tricuspid Valve: The tricuspid valve is normal in structure. Tricuspid valve regurgitation is trivial. Aortic Valve: The aortic valve was not well visualized. Aortic valve regurgitation is trivial. No aortic stenosis is  present. Aortic valve peak gradient measures 5.8 mmHg. Pulmonic Valve: The pulmonic valve was not well visualized. Pulmonic valve regurgitation is not visualized. Aorta: The aortic root is normal in size and structure. IAS/Shunts: The interatrial septum was not well visualized.  LEFT VENTRICLE PLAX 2D LVIDd:         2.00 cm   Diastology LVIDs:         1.40 cm   LV e' medial:    7.62 cm/s LV PW:         1.10 cm   LV E/e' medial:  13.1 LV IVS:        1.00 cm   LV e' lateral:   11.40 cm/s LVOT diam:     2.00 cm   LV E/e' lateral: 8.8 LV SV:         43 LV SV Index:   25 LVOT Area:     3.14 cm  RIGHT VENTRICLE RV S prime:     12.00 cm/s TAPSE (M-mode): 1.8 cm LEFT ATRIUM           Index        RIGHT ATRIUM           Index LA diam:      3.00 cm 1.72 cm/m   RA Area:     13.90 cm LA Vol (A2C): 20.3 ml 11.61 ml/m  RA Volume:   28.90 ml  16.53 ml/m LA Vol (A4C): 22.7 ml 12.98 ml/m  AORTIC VALVE AV Area (Vmax): 2.01 cm AV Vmax:        120.00 cm/s AV Peak Grad:   5.8 mmHg LVOT Vmax:      76.63 cm/s LVOT Vmean:     58.967 cm/s LVOT VTI:       0.136 m  AORTA Ao Root diam: 3.60 cm MITRAL VALVE MV Area (PHT): 4.15 cm     SHUNTS MV Decel Time: 183 msec     Systemic VTI:  0.14 m MV E velocity: 100.00 cm/s  Systemic Diam: 2.00 cm MV A velocity: 59.20 cm/s MV E/A ratio:  1.69 Lonni Nanas MD Electronically signed by Lonni Nanas MD Signature Date/Time: 06/10/2024/1:06:18 PM    Final  Anti-infectives: Anti-infectives (From admission, onward)    Start     Dose/Rate Route Frequency Ordered Stop   06/11/24 0915  piperacillin -tazobactam (ZOSYN ) IVPB 3.375 g        3.375 g 12.5 mL/hr over 240 Minutes Intravenous Every 8 hours 06/11/24 0820 06/16/24 0559   06/11/24 0800  cefTRIAXone (ROCEPHIN) 1 g in sodium chloride  0.9 % 100 mL IVPB  Status:  Discontinued        1 g 200 mL/hr over 30 Minutes Intravenous Every 24 hours 06/11/24 0642 06/11/24 0710   06/11/24 0800  cefTRIAXone (ROCEPHIN) 2 g in sodium  chloride 0.9 % 100 mL IVPB  Status:  Discontinued        2 g 200 mL/hr over 30 Minutes Intravenous Every 24 hours 06/11/24 0710 06/11/24 0815        Assessment/Plan SBO - bowel obstruction does not appear to be resolving - in ICU with aspiration PNA - on HFNC and weaning down as able  - on TPN, would continue in setting of ongoing obstruction with NGT to LIWS - palliative saw yesterday and patient would like to be a DNR w/ limited interventions. Would not like to be intubated in the setting of a CODE but okay with everything up to that  - OR for SBO is not emergent at this time, would like to continue to try to get pulmonary status better prior to any surgical intervention. However patient does not appear to be improving.  - will continue to follow closely and plan OR for SBO when more appropriate  FEN: NPO, TPN, NGT to LIWS VTE: hep gtt ID: [id number]  for aspiration PNA  - per TRH/CCM -  ?Aspiration PNA - currently on HFNC Chronic A. Fib with RVR AKI HLD    LOS: 7 days   I reviewed Consultant palliative, pulmonolgy, cardiology notes, hospitalist notes, last 24 h vitals and pain scores, last 48 h intake and output, last 24 h labs and trends, and last 24 h imaging results.  This care required moderate level of medical decision making.    Burnard JONELLE Louder, [facility] 06/12/2024, 8:31 AM Please see Amion for pager number during day hours 7:00am-4:30pm

## 2024-06-12 NOTE — Progress Notes (Signed)
 Cardiology Progress Note  Patient ID: Gary Rice [facility] MRN: [id number] DOB: 12-23-1930 Date of Encounter: 06/12/2024 Primary Cardiologist: None  Subjective   Chief Complaint: SOB  HPI: Remains in ICU.  A-fib heart rate 110 to 120 bpm.  With small bowel obstruction.  Hypotensive.  Not on pressors.  ROS:  All other ROS reviewed and negative. Pertinent positives noted in the HPI.     Telemetry  Overnight telemetry shows atrial fibrillation heart rate 110 to 120 bpm, which I personally reviewed.   ECG  The most recent ECG shows atrial fibrillation heart rate 157, nonspecific ST-T changes, which I personally reviewed.   Physical Exam   Vitals:   06/12/24 0545 06/12/24 0600 06/12/24 0746 06/12/24 0755  BP: 92/74 100/69    Pulse: (!) 129 (!) 128 (!) 106   Resp: 19 19 (!) 25   Temp:    98.7 F (37.1 C)  TempSrc:    Oral  SpO2: 95% 95% 96%   Weight:      Height:        Intake/Output Summary (Last 24 hours) at 06/12/2024 0926 Last data filed at 06/12/2024 0600 Gross per 24 hour  Intake 3827.43 ml  Output 1250 ml  Net 2577.43 ml       06/12/2024    4:28 AM 06/11/2024    7:40 AM 06/05/2024    5:05 PM  Last 3 Weights  Weight (lbs) 146 lb 13.2 oz 142 lb 6.7 oz 135 lb  Weight (kg) 66.6 kg 64.6 kg 61.236 kg    Body mass index is 22.32 kg/m.  General: Ill-appearing Head: Atraumatic, normal size  Eyes: PEERLA, EOMI  Neck: Supple, no JVD Endocrine: No thryomegaly Cardiac: Normal S1, S2; irregular rhythm, no murmurs Lungs: Diminished breath sounds bilaterally Abd: Soft, nontender, no hepatomegaly  Ext: No edema, pulses 2+ Musculoskeletal: No deformities, BUE and BLE strength normal and equal Skin: Warm and dry, no rashes   Neuro: Alert and oriented to person, place, time, and situation, CNII-XII grossly intact, no focal deficits  Psych: Normal mood and affect   Cardiac Studies  TTE 06/10/2024  1. Technically difficult study with limited views. Left ventricular  ejection  fraction, by estimation, is 50 to 55%. The left ventricle has low  normal function. Left ventricular endocardial border not optimally defined  to evaluate regional wall motion.  There is mild left ventricular hypertrophy. Left ventricular diastolic  parameters are indeterminate.   2. Right ventricular systolic function was not well visualized. The right  ventricular size is not well visualized.   3. The mitral valve is degenerative. Trivial mitral valve regurgitation.   4. The aortic valve was not well visualized. Aortic valve regurgitation  is trivial. No aortic stenosis is present.   Patient Profile  Gary Rice is a 89 y.o. male with paroxysmal atrial fibrillation, SVT, ascending aortic aneurysm, hyperlipidemia admitted on 06/05/2024 for small bowel obstruction and A-fib with RVR.  Course complicated by GI bleed and aspiration pneumonia.  Assessment & Plan   # Atrial fibrillation with RVR - Currently hypotensive in the ICU with aspiration pneumonia and small bowel obstruction.  Currently on TPN. - Very limited options as he is a strict NPO.  Would continue amiodarone  drip.  We have no other option for rate control. - Can consider digoxin  but given age and electrolyte derangements we can hold on this today.  If we have no other options we can pursue this.  Would avoid AV nodal agents such as beta-blockers  and calcium  channel blockers given hypotension. - Echo shows normal LV function which is reassuring. - Management of sepsis and pneumonia per primary team. - Holding anticoagulation given concerns for GI bleed. - Would recommend to restart heparin  drip when you are able.  # Small bowel obstruction - Strict NPO.  On TPN. - May end up needing surgery.  # Sepsis # Aspiration pneumonia # Hypotension # Acute hypoxic respiratory failure - Lactic acid is normal.  On antibiotics per primary team.  Seems to be currently stable.  Amiodarone  for A-fib rate control.  Can add digoxin  if  needed.  Unfortunately currently strict NPO.  # Ascending aortic aneurysm - Stable at 46 mm.  Not an issue this admission.  # Goals of Care - DNR limited.  Remains critically ill in the ICU.  He seems to understand his condition.     For questions or updates, please contact [facility] Please consult [email or web address] for contact info under        Signed, Darryle T. Barbaraann, MD, [facility]  [place]  [facility]  06/12/2024 9:26 AM

## 2024-06-12 NOTE — Plan of Care (Signed)
  Problem: Education: Goal: Knowledge of General Education information will improve Description: Including pain rating scale, medication(s)/side effects and non-pharmacologic comfort measures Outcome: Not Progressing   Problem: Health Behavior/Discharge Planning: Goal: Ability to manage health-related needs will improve Outcome: Not Progressing   Problem: Clinical Measurements: Goal: Ability to maintain clinical measurements within normal limits will improve Outcome: Not Progressing Goal: Will remain free from infection Outcome: Not Progressing Goal: Diagnostic test results will improve Outcome: Not Progressing Goal: Respiratory complications will improve Outcome: Not Progressing Goal: Cardiovascular complication will be avoided Outcome: Not Progressing   Problem: Activity: Goal: Risk for activity intolerance will decrease Outcome: Not Progressing   Problem: Nutrition: Goal: Adequate nutrition will be maintained Outcome: Not Progressing   Problem: Coping: Goal: Level of anxiety will decrease Outcome: Not Progressing   Problem: Elimination: Goal: Will not experience complications related to bowel motility Outcome: Not Progressing Goal: Will not experience complications related to urinary retention Outcome: Not Progressing   Problem: Pain Managment: Goal: General experience of comfort will improve and/or be controlled Outcome: Not Progressing   Problem: Safety: Goal: Ability to remain free from injury will improve Outcome: Not Progressing   Problem: Skin Integrity: Goal: Risk for impaired skin integrity will decrease Outcome: Not Progressing   Problem: Education: Goal: Knowledge of disease or condition will improve Outcome: Not Progressing Goal: Understanding of medication regimen will improve Outcome: Not Progressing Goal: Individualized Educational Video(s) Outcome: Not Progressing   Problem: Activity: Goal: Ability to tolerate increased activity will  improve Outcome: Not Progressing   Problem: Cardiac: Goal: Ability to achieve and maintain adequate cardiopulmonary perfusion will improve Outcome: Not Progressing   Problem: Health Behavior/Discharge Planning: Goal: Ability to safely manage health-related needs after discharge will improve Outcome: Not Progressing

## 2024-06-13 ENCOUNTER — Inpatient Hospital Stay (HOSPITAL_COMMUNITY)

## 2024-06-13 DIAGNOSIS — K56609 Unspecified intestinal obstruction, unspecified as to partial versus complete obstruction: Secondary | ICD-10-CM | POA: Diagnosis not present

## 2024-06-13 DIAGNOSIS — J690 Pneumonitis due to inhalation of food and vomit: Secondary | ICD-10-CM | POA: Diagnosis not present

## 2024-06-13 DIAGNOSIS — I4891 Unspecified atrial fibrillation: Secondary | ICD-10-CM | POA: Diagnosis not present

## 2024-06-13 LAB — COMPREHENSIVE METABOLIC PANEL WITH GFR
ALT: 13 U/L (ref 0–44)
AST: 15 U/L (ref 15–41)
Albumin: 2.6 g/dL — ABNORMAL LOW (ref 3.5–5.0)
Alkaline Phosphatase: 37 U/L — ABNORMAL LOW (ref 38–126)
Anion gap: 14 (ref 5–15)
BUN: 14 mg/dL (ref 8–23)
CO2: 23 mmol/L (ref 22–32)
Calcium: 7.8 mg/dL — ABNORMAL LOW (ref 8.9–10.3)
Chloride: 95 mmol/L — ABNORMAL LOW (ref 98–111)
Creatinine, Ser: 0.64 mg/dL (ref 0.61–1.24)
GFR, Estimated: >60 mL/min (ref >60)
Glucose, Bld: 295 mg/dL — ABNORMAL HIGH (ref 70–99)
Potassium: 3.6 mmol/L (ref 3.5–5.1)
Sodium: 132 mmol/L — ABNORMAL LOW (ref 135–145)
Total Bilirubin: 0.5 mg/dL (ref 0.0–1.2)
Total Protein: 5.7 g/dL — ABNORMAL LOW (ref 6.5–8.1)

## 2024-06-13 LAB — GLUCOSE, CAPILLARY
Glucose-Capillary: 109 mg/dL — ABNORMAL HIGH (ref 70–99)
Glucose-Capillary: 123 mg/dL — ABNORMAL HIGH (ref 70–99)
Glucose-Capillary: 122 mg/dL — ABNORMAL HIGH (ref 70–99)
Glucose-Capillary: 135 mg/dL — ABNORMAL HIGH (ref 70–99)
Glucose-Capillary: 135 mg/dL — ABNORMAL HIGH (ref 70–99)
Glucose-Capillary: 164 mg/dL — ABNORMAL HIGH (ref 70–99)

## 2024-06-13 LAB — CBC
HCT: 32 % — ABNORMAL LOW (ref 39.0–52.0)
Hemoglobin: 10.9 g/dL — ABNORMAL LOW (ref 13.0–17.0)
MCH: 31.9 pg (ref 26.0–34.0)
MCHC: 34.1 g/dL (ref 30.0–36.0)
MCV: 93.6 fL (ref 80.0–100.0)
Platelets: 149 K/uL — ABNORMAL LOW (ref 150–400)
RBC: 3.42 MIL/uL — ABNORMAL LOW (ref 4.22–5.81)
RDW: 12.9 % (ref 11.5–15.5)
WBC: 13.6 K/uL — ABNORMAL HIGH (ref 4.0–10.5)
nRBC: 0 % (ref 0.0–0.2)

## 2024-06-13 LAB — MAGNESIUM: Magnesium: 1.9 mg/dL (ref 1.7–2.4)

## 2024-06-13 LAB — HEPARIN LEVEL (UNFRACTIONATED): Heparin Unfractionated: 0.34 [IU]/mL (ref 0.30–0.70)

## 2024-06-13 LAB — PHOSPHORUS: Phosphorus: 2.2 mg/dL — ABNORMAL LOW (ref 2.5–4.6)

## 2024-06-13 MED ORDER — POTASSIUM CHLORIDE 10 MEQ/100ML IV SOLN
10.0000 meq | INTRAVENOUS | Status: AC
  Administered 2024-06-13 (×2): 10 meq via INTRAVENOUS
  Filled 2024-06-13 (×2): qty 100

## 2024-06-13 MED ORDER — METHOCARBAMOL 500 MG PO TABS
500.0000 mg | ORAL_TABLET | Freq: Once | ORAL | Status: DC
  Filled 2024-06-13: qty 1

## 2024-06-13 MED ORDER — DIGOXIN 0.25 MG/ML IJ SOLN
0.1250 mg | Freq: Every day | INTRAMUSCULAR | Status: DC
  Administered 2024-06-13 – 2024-06-17 (×4): 0.125 mg via INTRAVENOUS
  Filled 2024-06-13 (×5): qty 0.5

## 2024-06-13 MED ORDER — TRAVASOL 10 % IV SOLN
INTRAVENOUS | Status: AC
  Filled 2024-06-13: qty 848.4

## 2024-06-13 MED ORDER — HEPARIN (PORCINE) 25000 UT/250ML-% IV SOLN
1150.0000 [IU]/h | INTRAVENOUS | Status: DC
  Administered 2024-06-13: 950 [IU]/h via INTRAVENOUS
  Administered 2024-06-14: 1050 [IU]/h via INTRAVENOUS
  Filled 2024-06-13 (×2): qty 250

## 2024-06-13 MED ORDER — MAGNESIUM SULFATE 2 GM/50ML IV SOLN
2.0000 g | Freq: Once | INTRAVENOUS | Status: AC
  Administered 2024-06-13: 2 g via INTRAVENOUS
  Filled 2024-06-13: qty 50

## 2024-06-13 MED ORDER — HYDRALAZINE HCL 20 MG/ML IJ SOLN: 2.0000 mg | Freq: Four times a day (QID) | INTRAMUSCULAR | Status: DC | PRN

## 2024-06-13 MED ORDER — POTASSIUM PHOSPHATES 15 MMOLE/5ML IV SOLN
15.0000 mmol | Freq: Once | INTRAVENOUS | Status: AC
  Administered 2024-06-13: 15 mmol via INTRAVENOUS
  Filled 2024-06-13: qty 5

## 2024-06-13 NOTE — Progress Notes (Signed)
 PHARMACY - ANTICOAGULATION CONSULT NOTE  Pharmacy Consult for heparin  Indication: atrial fibrillation  No Known Allergies  Patient Measurements: Height: 5' 8 (172.7 cm) Weight: 66.6 kg (146 lb 13.2 oz) IBW/kg (Calculated) : 68.4 HEPARIN  DW (KG): 64.6  Vital Signs: Temp: 98.4 F (36.9 C) (09/16 1950) Temp Source: Oral (09/16 1950) BP: 133/112 (09/16 1900) Pulse Rate: 114 (09/16 1930)  Labs: Recent Labs    06/11/24 0417 06/11/24 0709 06/11/24 0854 06/11/24 1155 06/12/24 0540 06/12/24 1230 06/13/24 0736 06/13/24 1943  HGB 13.1  --   --  12.6* 12.4*  --  10.9*  --   HCT 38.8*  --   --  38.2* 36.3*  --  32.0*  --   PLT 188  --   --   --  156  --  149*  --   LABPROT  --   --   --   --   --  15.9*  --   --   INR  --   --   --   --   --  1.2  --   --   HEPARINUNFRC 0.39  --   --   --   --   --   --  0.34  CREATININE 0.91  --  0.90  --  0.85  --  0.64  --   TROPONINIHS  --  19*  --   --   --   --   --   --     Estimated Creatinine Clearance: 56.7 mL/min (by C-G formula based on SCr of 0.64 mg/dL).   Medical History: Past Medical History:  Diagnosis Date   Astigmatism    Basal cell carcinoma    Inguinal hernia    bilateral   Macular degeneration, age related, nonexudative    bilateral   SVT (supraventricular tachycardia) (HCC)      Assessment: 89 yo M presenting with abdominal pain, constipation, in Afib RVR, hx of afib on Eliquis  PTA with last dose 9/8 AM, on hold for SBO eval.  Pharmacy is consulted for heparin  dosing.   Heparin  has been on hold for hemoptysis. Cards ordered to resume today.   Hgb 10.9, plt ~150k. Confirmed with RN, no further issues with hemoptysis or other s/sx bleeding. Heparin  level trending down but therapeutic.  Goal of Therapy:  Heparin  level: 0.3-0.5 Monitor platelets by anticoagulation protocol: Yes   Plan:  Empiric increase heparin  infusion to 1000 units/hr Check heparin  level with AM labs and daily while on heparin  Continue  to monitor H&H and platelets  Thank you for allowing pharmacy to be a part of this patient's care.  Shelba Collier, PharmD, BCPS Clinical Pharmacist

## 2024-06-13 NOTE — Progress Notes (Signed)
 Nutrition Follow-up  DOCUMENTATION CODES:   Not applicable  INTERVENTION:   Continue TPN per pharmacy- provides 1627kcal/day and 85g/day protein  Continue thiamine  100mg  IV daily x 7 days   Pt remains at high refeed risk; recommend monitor potassium, magnesium  and phosphorus labs daily until stable  Daily weights   NUTRITION DIAGNOSIS:   Inadequate oral intake related to inability to eat as evidenced by NPO status. -ongoing   GOAL:   Patient will meet greater than or equal to 90% of their needs -met   MONITOR:   Diet advancement, Labs, Weight trends, Skin, I & O's, Other (Comment) (TPN)  ASSESSMENT:   89 y/o male with h/o PAF, HLD, SVT, AAA, dysphagia (followed by outpatient SLP) and bilateral inguinal hernias s/p open right inguinal hernia repair with mesh (05/03/2021) complicated by SBO with ischemic bowel s/p diagnostic laparoscopy with open small bowel resection (19.8cm) with primary anastomosis (05/09/2021) complicated by intra-abdominal abscess and who is now admitted with SBO, aspiration PNA and sepsis.  RD working remotely.  Pt admitted for SBO and is being medically managed at this time. TPN initiated 9/14 and pt is tolerating well with plans to advance to goal rate tonight. Pt is actively refeeding; electrolytes being monitored and supplemented per pharmacy. NGT remains in place to LIS with output. No bowel function noted since 9/11. Per chart, pt is up ~11lbs since admission but appears to be around his UBW currently. Pt -4.8L on his I & Os. Plan is for possible surgical intervention once pt is medically stable. Palliative care following.   Medications reviewed and include: insulin , protonix , thiamine , NaCl @75ml /hr, heparin , zosyn , Kphos, TPN   Labs reviewed: Na 132(L), K 3.6 wnl, P 2.2(L), Mg 1.9 wnl  Triglycerides 45- 9/15 Wbc- 13.6(H), Hgb 10.9(L), Hct 32.0(L) Cbgs- 122, 123, 109 x 24 hrs  AIC 5.8(H)- 8/14  UOP-   Diet Order:   Diet Order              Diet NPO time specified  Diet effective now                  EDUCATION NEEDS:   Not appropriate for education at this time  Skin:  Skin Assessment: Reviewed RN Assessment  Last BM:  9/11- type 2  Height:   Ht Readings from Last 1 Encounters:  06/11/24 5' 8 (1.727 m)    Weight:   Wt Readings from Last 1 Encounters:  06/12/24 66.6 kg    Ideal Body Weight:  70 kg  BMI:  Body mass index is 22.32 kg/m.  Estimated Nutritional Needs:   Kcal:  1700-2000kcal/day  Protein:  85-100g/day  Fluid:  1.7-2.0L/day  Augustin Shams MS, RD, LDN If unable to be reached, please send secure chat to RD inpatient available from 8:00a-4:00p daily

## 2024-06-13 NOTE — Progress Notes (Signed)
 PHARMACY - TOTAL PARENTERAL NUTRITION CONSULT NOTE  Indication: Small bowel obstruction  Patient Measurements: Height: 5' 8 (172.7 cm) Weight: 66.6 kg (146 lb 13.2 oz) IBW/kg (Calculated) : 68.4 TPN AdjBW (KG): 64.6 Body mass index is 22.32 kg/m.  Assessment:  43 YOM presented on 9/8 with constipation, found to have a SBO.  PMH significant for inguinal hernia post repair and SBR in 2022.  Patient was started on CLD on 9/11 after NGT removal and then advanced to FLD on 9/12 from which he vomited.  Patient was hypotensive and in Afib on 9/13, requiring transfer to the ICU.  NGT reinserted and diet changed back to NPO due to aspiration.  Patient has inadequate oral intake since admission and Pharmacy consulted to manage TPN for SBO.  He might require surgery and is at risk for refeeding.  Glucose / Insulin : no hx DM - glucose this am 295, but finger sticks have been < 180 - speculate that blood drawn thru line infusing TPN - no change at this time Electrolytes: low Na/CL, K 3.6, Phos 2.2, Mag 1.9 Renal: SCr < 1, BUN 14 Hepatic: LFTs WNL, alk phos 37, albumin  3.9 on admit now 2.6 Intake / Output; MIVF: Emesis 300, UOP 1100 GI Imaging: none since TPN initiation GI Surgeries / Procedures: none since TPN initiation  Central access: 06/11/24 TPN start date: 06/11/24  Nutritional Goals: Goal TPN rate is 70 mL/hr provides 85g AA, 235g CHO, 49g ILE and 1627 kCal per day  RD Estimated Needs Total Energy Estimated Needs: 1600-1800 kcal/d Total Protein Estimated Needs: 80-95g/d Total Fluid Estimated Needs: 1.8-2L/d  Current Nutrition:  NPO  Plan:  Increase TPN to goal rate of 63mL/hr at 1800  Electrolytes in TPN: Na 14mEq/L, K 59mEq/L, Ca 76mEq/L, Mg 55mEq/L, Phos 22mmol/L, Cl:Ac 2:1 Add standard MVI and trace elements to TPN Change SSI to sensitive SSI Q4H Thiamine  100mg  IV daily x 5 doses Mag sulfate 2gm IV x 1  KCL x 2 runs KPhos 15mmmol IV x 1 Monitor TPN labs on Mon/Thurs - labs in  AM - experiencing refeeding - monitor closely  Donny Alert, PharmD, [facility] Clinical Pharmacist Please see AMION for all Pharmacists' Contact Phone Numbers 06/13/2024, 9:31 AM

## 2024-06-13 NOTE — Progress Notes (Signed)
 Cardiology Progress Note  Patient ID: Gary Rice [facility] MRN: [id number] DOB: April 09, 1931 Date of Encounter: 06/13/2024 Primary Cardiologist: None  Subjective   Chief Complaint: Weakness  HPI: He reports he feels weak in the hospital bed.  Still no flatus or bowel movement.  A-fib rates slightly improved.  ROS:  All other ROS reviewed and negative. Pertinent positives noted in the HPI.     Telemetry  Overnight telemetry shows A-fib 100 to 120 bpm, which I personally reviewed.     Physical Exam   Vitals:   06/13/24 0400 06/13/24 0500 06/13/24 0755 06/13/24 0800  BP: 126/82 113/77  (!) 128/93  Pulse: (!) 107 (!) 107 (!) 114 (!) 114  Resp: (!) 24 (!) 22 (!) 24 (!) 23  Temp:    98 F (36.7 C)  TempSrc:    Oral  SpO2: 94% 95% 94% 94%  Weight:      Height:        Intake/Output Summary (Last 24 hours) at 06/13/2024 0849 Last data filed at 06/13/2024 0800 Gross per 24 hour  Intake 3446.46 ml  Output 1100 ml  Net 2346.46 ml       06/12/2024    4:28 AM 06/11/2024    7:40 AM 06/05/2024    5:05 PM  Last 3 Weights  Weight (lbs) 146 lb 13.2 oz 142 lb 6.7 oz 135 lb  Weight (kg) 66.6 kg 64.6 kg 61.236 kg    Body mass index is 22.32 kg/m.  General: Well nourished, well developed, in no acute distress Head: Atraumatic, normal size  Eyes: PEERLA, EOMI  Neck: Supple, no JVD Endocrine: No thryomegaly Cardiac: Normal S1, S2; irregular rhythm, no murmurs Lungs: Diminished breath sounds bilaterally Abd: Soft, nontender, no hepatomegaly  Ext: No edema, pulses 2+ Musculoskeletal: No deformities, BUE and BLE strength normal and equal Skin: Warm and dry, no rashes   Neuro: Alert and oriented to person, place, time, and situation, CNII-XII grossly intact, no focal deficits  Psych: Normal mood and affect   Cardiac Studies  TTE 06/10/2024  1. Technically difficult study with limited views. Left ventricular  ejection fraction, by estimation, is 50 to 55%. The left ventricle has low   normal function. Left ventricular endocardial border not optimally defined  to evaluate regional wall motion.  There is mild left ventricular hypertrophy. Left ventricular diastolic  parameters are indeterminate.   2. Right ventricular systolic function was not well visualized. The right  ventricular size is not well visualized.   3. The mitral valve is degenerative. Trivial mitral valve regurgitation.   4. The aortic valve was not well visualized. Aortic valve regurgitation  is trivial. No aortic stenosis is present.   Patient Profile  Gary Rice is a 89 y.o. male with paroxysmal atrial fibrillation, SVT, ascending aortic aneurysm, hyperlipidemia admitted on 06/05/2024 with small bowel obstruction and A-fib with RVR.  Course complicated by GI bleed and aspiration pneumonia.  Assessment & Plan   # Atrial fibrillation with RVR - Currently in the ICU.  With small bowel obstruction on TPN.  Strict NPO. - Also concerns for possible GI bleed. - Since he cannot take oral medications we have limited options.  Continue IV amiodarone  drip for now. - Kidney function electrolytes are stable.  Continue digoxin  0.125 mg IV daily. - Management of sepsis and pneumonia per primary team. - Restart anticoagulation when deemed safe by medical team.  # Small bowel obstruction - NPO.  On TPN.  May end up needing surgery.  #  Sepsis # Aspiration pneumonia # Hypotension # Acute hypoxic respiratory failure - Antibiotics per primary team.  # Ascending aortic aneurysm - Stable at 46 mm  # Goals of care - DNR limited.  Ongoing palliative care discussions.     For questions or updates, please contact [facility] Please consult [email or web address] for contact info under        Signed, Darryle T. Barbaraann, MD, [facility]  [facility]  06/13/2024 8:49 AM

## 2024-06-13 NOTE — Plan of Care (Signed)
  Problem: Education: Goal: Knowledge of General Education information will improve Description: Including pain rating scale, medication(s)/side effects and non-pharmacologic comfort measures Outcome: Progressing   Problem: Clinical Measurements: Goal: Ability to maintain clinical measurements within normal limits will improve Outcome: Progressing Goal: Will remain free from infection Outcome: Progressing Goal: Diagnostic test results will improve Outcome: Progressing Goal: Respiratory complications will improve Outcome: Progressing Goal: Cardiovascular complication will be avoided Outcome: Progressing   Problem: Nutrition: Goal: Adequate nutrition will be maintained Outcome: Progressing   Problem: Coping: Goal: Level of anxiety will decrease Outcome: Progressing   Problem: Elimination: Goal: Will not experience complications related to bowel motility Outcome: Progressing Goal: Will not experience complications related to urinary retention Outcome: Progressing   Problem: Pain Managment: Goal: General experience of comfort will improve and/or be controlled Outcome: Progressing   Problem: Safety: Goal: Ability to remain free from injury will improve Outcome: Progressing   Problem: Skin Integrity: Goal: Risk for impaired skin integrity will decrease Outcome: Progressing   Problem: Activity: Goal: Ability to tolerate increased activity will improve Outcome: Progressing   Problem: Cardiac: Goal: Ability to achieve and maintain adequate cardiopulmonary perfusion will improve Outcome: Progressing

## 2024-06-13 NOTE — Progress Notes (Signed)
 TR[facility]   ROUNDING   NOTE Gary Rice [facility] FMW:[id number]  DOB: 1930-12-15  DOA: 06/05/2024  PCP: Kip Righter, MD  06/13/2024,10:46 AM  LOS: 8 days    Code Status:  full     From:  home    89 yr WM Known chronic A-fib (Since 06/06/2021)CHADVASC >4 on Eliquis , HLD Prior episodic syncope with head injury 02/23/2022 Prior right hip repair Prior SBO status post open bowel resection 05/09/2021 comp created by pelvic abscesses during that hospital stay and pelvic drain placement as well as IR grew Klebsiella Bacteroides and underwent MRCP P as well and was discharged to CIR   9/8 admitted with recurrent SBO A-fib RVR-had AKI additionally-- initially trialed Cardizem , an beta-blocker for rate control 9/14 NG tube removed by general surgery but then developed dry blood with sputum heparin  was held became hypotensive-became distended again, 1800 cc out of brown burgundy succus IV PPI IV heparin  DC and transferred to ICU Echo 50% EF 9/14 AM cardiology and critical care consulted and decompensated 9/16 CXR = no significant change to appearance atelectasis?  Effusions with overlying airspace disease  Remains in critical but stable condition on high oxygen requirements--likely requiring eventual surgery for persistent SBO     Assessment  & Plan :     bowel obstruction Defer to surgery-needs to wean down off oxygen--needs eventual surgery--- pain control Dilaudid  0.5-1 IV GI protection Protonix  40 IV twice daily Compazine  5 every 6 as needed Continues TPN 50 cc/H-watch LFTs and mag Aspiration 9/14 with likely aspiration pneumonia Hypoxic respiratory failure secondary to aspiration Zosyn  started 9/14 continue at least 5 to 7 days Persisting probable pneumonia-?sputum for culture?--asked RN to collect and send  WBC improved Pulmonary toilet incentive spirometer bullet Cut back fluid to 40 cc/H Wean oxygen/flow as able-nursing aware Anemia, Hemoccult positive Reported hemoptysis 9/14 when  vomiting Hemoccult + 9/14--no BM Not sure if Gastroccult was ever done--- could have been bilious vomit? A-fib RVR CHADVASC >4 Cautious heparin  okayed by general surgery and discussion with cardiology Amio gtt. currently at 60-digoxin  0.125 IV daily Asymptomatic hypotension currently--likely secondary to IV effect of beta-blockade from amiodarone  Had boluses of fluid on 9/14 given IV albumin  9/15 Keep saline at 40 cc/h Prn for hypertension added Electrolyte disturbance secondary to loss of succus Hypokalemia-resolved with runs of K on 9/15 Hypomagnesemia give 1 more gram of magnesium  IV today Hypophosphatemia-hypocalcemia-trend for now improved slightly  AKI on admission Improved-fluids/TPN as above Consumptive/dilutional thrombocytopenia Baseline seems to have normal platelet counts-this may be all dilutional?-If continues to drop may need to switch heparin  to alternate agent Watch for bleeding Reported 46 mm aortic aneurysm  chronic diastolic heart failure EF 50% this admission -4.5 L Overall weight up however so may decide to cut back fluids and just use TPN alone  Data Reviewed:   Sodium 132 potassium 3.6 BUN/creatinine 14/0.6 phosphorus 2.2 magnesium  1.9 AST/ALT 15/13 total protein 5.7 WBC down to 13.6 hemoglobin 10.9 platelet 149  DVT prophylaxis: SCD  Status is: Inpatient Remains inpatient appropriate because:    Critically ill   Current Dispo: Inpatient     Subjective:   No apparent distress looks comfortable talking on the phone with his wife No other issues--no flauts, no SOB  Objective + exam Vitals:   06/13/24 0500 06/13/24 0755 06/13/24 0800 06/13/24 0900  BP: 113/77  (!) 128/93 (!) 123/94  Pulse: (!) 107 (!) 114 (!) 114 (!) 108  Resp: (!) 22 (!) 24 (!) 23 (!) 21  Temp:  98 F (36.7 C)   TempSrc:   Oral   SpO2: 95% 94% 94% 96%  Weight:      Height:       Filed Weights   06/05/24 1705 06/11/24 0740 06/12/24 0428  Weight: 61.2 kg 64.6 kg 66.6 kg      Examination:  EOMI NCAT no focal deficit Chest clear no wheeze rales JVD elevated ROM intact moving 4 limbs Abdomen soft Coherent  Scheduled Meds:  Chlorhexidine  Gluconate Cloth  6 each Topical Q0600   digoxin   0.125 mg Intravenous Daily   Influenza vac split trivalent PF  0.5 mL Intramuscular Tomorrow-1000   insulin  aspart  0-9 Units Subcutaneous Q4H   ipratropium  2 spray Each Nare BID   pantoprazole  (PROTONIX ) IV  40 mg Intravenous Q12H   sodium chloride  flush  10-40 mL Intracatheter Q12H   sodium chloride  flush  3 mL Intravenous Q12H   thiamine  (VITAMIN B1) injection  100 mg Intravenous Daily   Continuous Infusions:  sodium chloride  75 mL/hr at 06/13/24 0530   amiodarone  60 mg/hr (06/13/24 0530)   heparin      magnesium  sulfate bolus IVPB 2 g (06/13/24 1034)   piperacillin -tazobactam (ZOSYN )  IV 12.5 mL/hr at 06/13/24 0530   potassium chloride  10 mEq (06/13/24 1034)   potassium PHOSPHATE  IVPB (in mmol)     TPN ADULT (ION) 50 mL/hr at 06/13/24 0530   TPN ADULT (ION)      Time 33  Colen Grimes, MD  [facility]

## 2024-06-13 NOTE — Progress Notes (Cosign Needed Addendum)
 Progress Note     Subjective: Pt denies abdominal pain currently. Weaning down on HFNC as able.   Objective: Vital signs in last 24 hours: Temp:  [98 F (36.7 C)-98.8 F (37.1 C)] 98 F (36.7 C) (09/16 0800) Pulse Rate:  [84-146] 108 (09/16 0900) Resp:  [14-34] 21 (09/16 0900) BP: (100-154)/(63-97) 123/94 (09/16 0900) SpO2:  [90 %-98 %] 96 % (09/16 0900) FiO2 (%):  [30 %-40 %] 30 % (09/16 0800) Last BM Date : 06/09/24  Intake/Output from previous day: 09/15 0701 - 09/16 0700 In: 3568.1 [I.V.:2841.2; NG/GT:30; IV Piggyback:696.9] Out: 1400 [Urine:1100; Emesis/NG output:300] Intake/Output this shift: Total I/O In: 30 [NG/GT:30] Out: 250 [Urine:250]  PE: General: pleasant, WD, elderly male who is laying in bed in NAD Heart: irregularly irregular in the 100-120s Lungs: normal effort on HFNC at 20 L/min, no wheezing Abd: soft, NT, hernia soft, NGT with thin clear drainage Psych: A&Ox4 with an appropriate affect.    Lab Results:  Recent Labs    06/12/24 0540 06/13/24 0736  WBC 15.9* 13.6*  HGB 12.4* 10.9*  HCT 36.3* 32.0*  PLT 156 149*   BMET Recent Labs    06/12/24 0540 06/13/24 0736  NA 134* 132*  K 3.4* 3.6  CL 99 95*  CO2 27 23  GLUCOSE 128* 295*  BUN 19 14  CREATININE 0.85 0.64  CALCIUM  8.4* 7.8*   PT/INR Recent Labs    06/12/24 1230  LABPROT 15.9*  INR 1.2   CMP     Component Value Date/Time   NA 132 (L) 06/13/2024 0736   NA 143 04/23/2022 1454   K 3.6 06/13/2024 0736   CL 95 (L) 06/13/2024 0736   CO2 23 06/13/2024 0736   GLUCOSE 295 (H) 06/13/2024 0736   BUN 14 06/13/2024 0736   BUN 23 04/23/2022 1454   CREATININE 0.64 06/13/2024 0736   CALCIUM  7.8 (L) 06/13/2024 0736   PROT 5.7 (L) 06/13/2024 0736   ALBUMIN  2.6 (L) 06/13/2024 0736   AST 15 06/13/2024 0736   ALT 13 06/13/2024 0736   ALKPHOS 37 (L) 06/13/2024 0736   BILITOT 0.5 06/13/2024 0736   GFRNONAA >60 06/13/2024 0736   GFRAA  08/14/2007 0312    >60        The eGFR has  been calculated using the MDRD equation. This calculation has not been validated in all clinical   Lipase     Component Value Date/Time   LIPASE 18 06/05/2024 1241       Studies/Results: DG CHEST PORT 1 VIEW Result Date: 06/13/2024 CLINICAL DATA:  [id number].  Pneumonia. EXAM: PORTABLE CHEST 1 VIEW COMPARISON:  Portable chest 06/11/2024 at 4:12 p.m. FINDINGS: 7:47 a.m. NGT extends into the stomach to the left with the tip in the proximal fundus. Right PICC again terminates at the superior cavoatrial junction. There are overlying telemetry leads. The cardiac size is normal. There is aortic tortuosity and atherosclerosis with stable mediastinum. There are small bilateral pleural effusions with overlying patchy airspace disease or atelectasis in the lung bases. The mid and upper lungs remain clear apart from linear perihilar atelectasis. Overall aeration seems unchanged.  No new osseous finding. IMPRESSION: 1. No significant change in the appearance of the chest. Small bilateral pleural effusions with overlying patchy airspace disease or atelectasis in the lung bases. 2. Aortic atherosclerosis. Electronically Signed   By: Francis Quam M.D.   On: 06/13/2024 07:59   DG CHEST PORT 1 VIEW Result Date: 06/11/2024 CLINICAL DATA:  [id number]  Status post PICC central line placement [id number] EXAM: PORTABLE CHEST 1 VIEW COMPARISON:  Chest x-ray 06/11/2024 8:45 a.m., chest x-ray 06/10/2024 FINDINGS: Interval placement of a right PICC with tip overlying the expected region of the distal superior vena cava. Redemonstration of enteric tube coursing below the hemidiaphragm with tip and side port overlying the expected region of the gastric lumen. The heart and mediastinal contours are unchanged. Interval increase in bilateral mid lower lung zone airspace opacities. Diffuse increased interstitial markings. No pneumothorax. No acute osseous abnormality. IMPRESSION: 1. Interval increase in bilateral mid lower lung zone  airspace opacities. Findings suggestive of combination of pleural effusion and underlying atelectasis or pulmonary disease. 2. Lines and tubes as above. Electronically Signed   By: Morgane  Naveau M.D.   On: 06/11/2024 17:04    Anti-infectives: Anti-infectives (From admission, onward)    Start     Dose/Rate Route Frequency Ordered Stop   06/11/24 0915  piperacillin -tazobactam (ZOSYN ) IVPB 3.375 g        3.375 g 12.5 mL/hr over 240 Minutes Intravenous Every 8 hours 06/11/24 0820 06/16/24 0559   06/11/24 0800  cefTRIAXone (ROCEPHIN) 1 g in sodium chloride  0.9 % 100 mL IVPB  Status:  Discontinued        1 g 200 mL/hr over 30 Minutes Intravenous Every 24 hours 06/11/24 0642 06/11/24 0710   06/11/24 0800  cefTRIAXone (ROCEPHIN) 2 g in sodium chloride  0.9 % 100 mL IVPB  Status:  Discontinued        2 g 200 mL/hr over 30 Minutes Intravenous Every 24 hours 06/11/24 0710 06/11/24 0815        Assessment/Plan SBO - bowel obstruction does not appear to be resolving - in ICU with aspiration PNA - on HFNC and weaning down as able  - on TPN, would continue in setting of ongoing obstruction with NGT to LIWS - palliative saw yesterday and patient would like to be a DNR w/ limited interventions. Would not like to be intubated in the setting of a CODE but okay with everything up to that  - OR for SBO is not emergent at this time, would like to continue to try to get pulmonary status better prior to any surgical intervention.  - will continue to follow closely and plan OR for SBO when more appropriate  FEN: NPO, TPN, NGT to LIWS VTE: hep gtt ok  ID: [email or web address]  for aspiration PNA  - per TRH/CCM -  ?Aspiration PNA - currently on HFNC Chronic A. Fib with RVR AKI HLD    LOS: 8 days   I reviewed Consultant palliative, pulmonolgy, cardiology notes, hospitalist notes, last 24 h vitals and pain scores, last 48 h intake and output, last 24 h labs and trends, and last 24 h imaging results.  This care  required moderate level of medical decision making.    Burnard JONELLE Louder, [facility] 06/13/2024, 10:22 AM Please see Amion for pager number during day hours 7:00am-4:30pm

## 2024-06-13 NOTE — Progress Notes (Signed)
 PHARMACY - ANTICOAGULATION CONSULT NOTE  Pharmacy Consult for heparin  Indication: atrial fibrillation  No Known Allergies  Patient Measurements: Height: 5' 8 (172.7 cm) Weight: 66.6 kg (146 lb 13.2 oz) IBW/kg (Calculated) : 68.4 HEPARIN  DW (KG): 64.6  Vital Signs: Temp: 98 F (36.7 C) (09/16 0800) Temp Source: Oral (09/16 0800) BP: 123/94 (09/16 0900) Pulse Rate: 108 (09/16 0900)  Labs: Recent Labs    06/10/24 1723 06/10/24 2251 06/11/24 0417 06/11/24 0709 06/11/24 0854 06/11/24 1155 06/12/24 0540 06/12/24 1230 06/13/24 0736  HGB  --    < > 13.1  --   --  12.6* 12.4*  --  10.9*  HCT  --    < > 38.8*  --   --  38.2* 36.3*  --  32.0*  PLT  --    < > 188  --   --   --  156  --  149*  LABPROT  --   --   --   --   --   --   --  15.9*  --   INR  --   --   --   --   --   --   --  1.2  --   HEPARINUNFRC 0.64  --  0.39  --   --   --   --   --   --   CREATININE  --    < > 0.91  --  0.90  --  0.85  --  0.64  TROPONINIHS  --   --   --  19*  --   --   --   --   --    < > = values in this interval not displayed.    Estimated Creatinine Clearance: 56.7 mL/min (by C-G formula based on SCr of 0.64 mg/dL).   Medical History: Past Medical History:  Diagnosis Date   Astigmatism    Basal cell carcinoma    Inguinal hernia    bilateral   Macular degeneration, age related, nonexudative    bilateral   SVT (supraventricular tachycardia) (HCC)      Assessment: 89 yo M presenting with abdominal pain, constipation, in Afib RVR, hx of afib on Eliquis  PTA with last dose 9/8 AM, on hold for SBO eval.  Pharmacy is consulted for heparin  dosing.   Heparin  has been on hold for hemoptysis. Cards ordered to resume today.   Hgb 10.9, plt ~150k  Goal of Therapy:  Heparin  level: 0.3-0.5 Monitor platelets by anticoagulation protocol: Yes   Plan:  Resume heparin  950 units/hr 8 hr HL  Daily HL and CBC  Sergio Batch, PharmD, BCIDP, AAHIVP, CPP Infectious Disease Pharmacist 06/13/2024  10:04 AM

## 2024-06-14 DIAGNOSIS — N179 Acute kidney failure, unspecified: Secondary | ICD-10-CM | POA: Diagnosis not present

## 2024-06-14 DIAGNOSIS — I4891 Unspecified atrial fibrillation: Secondary | ICD-10-CM | POA: Diagnosis not present

## 2024-06-14 DIAGNOSIS — Z515 Encounter for palliative care: Secondary | ICD-10-CM | POA: Diagnosis not present

## 2024-06-14 DIAGNOSIS — J690 Pneumonitis due to inhalation of food and vomit: Secondary | ICD-10-CM | POA: Diagnosis not present

## 2024-06-14 DIAGNOSIS — K56609 Unspecified intestinal obstruction, unspecified as to partial versus complete obstruction: Secondary | ICD-10-CM | POA: Diagnosis not present

## 2024-06-14 LAB — COMPREHENSIVE METABOLIC PANEL WITH GFR
ALT: 14 U/L (ref 0–44)
AST: 16 U/L (ref 15–41)
Albumin: 2.5 g/dL — ABNORMAL LOW (ref 3.5–5.0)
Alkaline Phosphatase: 41 U/L (ref 38–126)
Anion gap: 6 (ref 5–15)
BUN: 13 mg/dL (ref 8–23)
CO2: 24 mmol/L (ref 22–32)
Calcium: 8.1 mg/dL — ABNORMAL LOW (ref 8.9–10.3)
Chloride: 100 mmol/L (ref 98–111)
Creatinine, Ser: 0.57 mg/dL — ABNORMAL LOW (ref 0.61–1.24)
GFR, Estimated: >60 mL/min (ref >60)
Glucose, Bld: 147 mg/dL — ABNORMAL HIGH (ref 70–99)
Potassium: 4.3 mmol/L (ref 3.5–5.1)
Sodium: 130 mmol/L — ABNORMAL LOW (ref 135–145)
Total Bilirubin: 0.5 mg/dL (ref 0.0–1.2)
Total Protein: 6 g/dL — ABNORMAL LOW (ref 6.5–8.1)

## 2024-06-14 LAB — CBC
HCT: 36.3 % — ABNORMAL LOW (ref 39.0–52.0)
Hemoglobin: 12.2 g/dL — ABNORMAL LOW (ref 13.0–17.0)
MCH: 31.5 pg (ref 26.0–34.0)
MCHC: 33.6 g/dL (ref 30.0–36.0)
MCV: 93.8 fL (ref 80.0–100.0)
Platelets: 166 K/uL (ref 150–400)
RBC: 3.87 MIL/uL — ABNORMAL LOW (ref 4.22–5.81)
RDW: 12.9 % (ref 11.5–15.5)
WBC: 16 K/uL — ABNORMAL HIGH (ref 4.0–10.5)
nRBC: 0 % (ref 0.0–0.2)

## 2024-06-14 LAB — GLUCOSE, CAPILLARY
Glucose-Capillary: 151 mg/dL — ABNORMAL HIGH (ref 70–99)
Glucose-Capillary: 134 mg/dL — ABNORMAL HIGH (ref 70–99)
Glucose-Capillary: 138 mg/dL — ABNORMAL HIGH (ref 70–99)
Glucose-Capillary: 144 mg/dL — ABNORMAL HIGH (ref 70–99)
Glucose-Capillary: 131 mg/dL — ABNORMAL HIGH (ref 70–99)
Glucose-Capillary: 127 mg/dL — ABNORMAL HIGH (ref 70–99)

## 2024-06-14 LAB — PHOSPHORUS: Phosphorus: 2.6 mg/dL (ref 2.5–4.6)

## 2024-06-14 LAB — MAGNESIUM: Magnesium: 1.9 mg/dL (ref 1.7–2.4)

## 2024-06-14 LAB — HEPARIN LEVEL (UNFRACTIONATED): Heparin Unfractionated: 0.28 [IU]/mL — ABNORMAL LOW (ref 0.30–0.70)

## 2024-06-14 MED ORDER — TRAVASOL 10 % IV SOLN
INTRAVENOUS | Status: AC
  Filled 2024-06-14: qty 848.4

## 2024-06-14 NOTE — Progress Notes (Signed)
 Occupational Therapy Treatment Patient Details Name: Gary Rice MRN: [id number] DOB: 1931/08/31 Today's Date: 06/14/2024   History of present illness Pt is a 89 y.o. M who presents 06/05/2024 with SBO. Significant PMH:  A-fib on Cardizem  and Eliquis , history of SVT, history of inguinal hernia, history of bowel resection in 2022, inguinal hernia repair 2022.   OT comments  Patient declined OOB due to patient believing it would increase coughing.  Patient asked to address UE HEP and shoulder ROM. Patient performed UB HEP with level one therapy band except AROM for shoulder flexion. Patient states he hopes to increase participation with therapy when he feels better. Discharge recommendations continue to be appropriate.  Acute OT to continue to follow to address established goals to facilitate DC to next venue of care.         If plan is discharge home, recommend the following:  A little help with walking and/or transfers;A lot of help with bathing/dressing/bathroom;Assistance with cooking/housework;Assist for transportation;Help with stairs or ramp for entrance   Equipment Recommendations  Other (comment) (TBD based on pt progress)    Recommendations for Other Services      Precautions / Restrictions Precautions Precautions: Fall;Other (comment) Recall of Precautions/Restrictions: Intact Precaution/Restrictions Comments: watch HR & SpO2, NG tube, HHFNC, TPN, Restrictions Weight Bearing Restrictions Per Provider Order: No       Mobility Bed Mobility                    Transfers                         Balance                                           ADL either performed or assessed with clinical judgement   ADL Overall ADL's : Needs assistance/impaired                                       General ADL Comments: declined self care tasks, patient wanted to address BUE HEP    Extremity/Trunk Assessment               Vision       Perception     Praxis     Communication Communication Communication: No apparent difficulties   Cognition Arousal: Alert Behavior During Therapy: WFL for tasks assessed/performed Cognition: No apparent impairments                               Following commands: Intact        Cueing   Cueing Techniques: Verbal cues, Tactile cues  Exercises Exercises: General Upper Extremity General Exercises - Upper Extremity Shoulder Flexion: AROM, Both, 10 reps, Supine Shoulder Horizontal ABduction: Strengthening, Both, 10 reps, Supine, Theraband Theraband Level (Shoulder Horizontal Abduction): Level 1 (Yellow) Elbow Flexion: Strengthening, Both, 10 reps, Seated, Theraband Theraband Level (Elbow Flexion): Level 1 (Yellow) Elbow Extension: Strengthening, Both, 10 reps, Seated, Theraband Theraband Level (Elbow Extension): Level 1 (Yellow)    Shoulder Instructions       General Comments      Pertinent Vitals/ Pain       Pain Assessment Pain Assessment: No/denies pain Pain Intervention(s): Monitored during  session  Home Living                                          Prior Functioning/Environment              Frequency  Min 2X/week        Progress Toward Goals  OT Goals(current goals can now be found in the care plan section)  Progress towards OT goals: Progressing toward goals  Acute Rehab OT Goals Patient Stated Goal: to get stronger OT Goal Formulation: With patient Time For Goal Achievement: 06/24/24 Potential to Achieve Goals: Good ADL Goals Pt Will Perform Grooming: with modified independence;standing Pt Will Perform Lower Body Bathing: with modified independence;sitting/lateral leans;sit to/from stand Pt Will Perform Lower Body Dressing: with modified independence;sitting/lateral leans;sit to/from stand Pt Will Transfer to Toilet: with modified independence;ambulating;regular height toilet (with least  restrictive AD) Pt Will Perform Toileting - Clothing Manipulation and hygiene: with modified independence;sitting/lateral leans;sit to/from stand Additional ADL Goal #1: Patient will demonstrate ability to Independently state 3 energy conservation strategies to increase safety and independence with functional tasks.  Plan      Co-evaluation                 AM-PAC OT 6 Clicks Daily Activity     Outcome Measure   Help from another person eating meals?: None Help from another person taking care of personal grooming?: A Little Help from another person toileting, which includes using toliet, bedpan, or urinal?: A Lot Help from another person bathing (including washing, rinsing, drying)?: A Lot Help from another person to put on and taking off regular upper body clothing?: A Little Help from another person to put on and taking off regular lower body clothing?: A Little 6 Click Score: 17    End of Session Equipment Utilized During Treatment: Oxygen (6 liters HFNC)  OT Visit Diagnosis: Other abnormalities of gait and mobility (R26.89);Pain;Other (comment) (decreased activity tolerance)   Activity Tolerance Patient tolerated treatment well   Patient Left in bed;with call bell/phone within reach;with bed alarm set   Nurse Communication Other (comment) (patient participation with OT)        Time: 8599-8581 OT Time Calculation (min): 18 min  Charges: OT General Charges $OT Visit: 1 Visit OT Treatments $Therapeutic Exercise: 8-22 mins  Dick Laine, OTA Acute Rehabilitation Services  Office [phone]   Jeb LITTIE Laine 06/14/2024, 2:27 PM

## 2024-06-14 NOTE — Plan of Care (Signed)
  Problem: Pain Managment: Goal: General experience of comfort will improve and/or be controlled Outcome: Progressing   Problem: Safety: Goal: Ability to remain free from injury will improve Outcome: Progressing   Problem: Skin Integrity: Goal: Risk for impaired skin integrity will decrease Outcome: Progressing   Problem: Education: Goal: Understanding of medication regimen will improve Outcome: Progressing   Problem: Education: Goal: Individualized Educational Video(s) Outcome: Progressing

## 2024-06-14 NOTE — Progress Notes (Signed)
 PHARMACY - TOTAL PARENTERAL NUTRITION CONSULT NOTE  Indication: Small bowel obstruction  Patient Measurements: Height: 5' 8 (172.7 cm) Weight: 70.2 kg (154 lb 12.2 oz) IBW/kg (Calculated) : 68.4 TPN AdjBW (KG): 64.6 Body mass index is 23.53 kg/m.  Assessment:  86 YOM presented on 9/8 with constipation, found to have a SBO.  PMH significant for inguinal hernia post repair and SBR in 2022.  Patient was started on CLD on 9/11 after NGT removal and then advanced to FLD on 9/12 from which he vomited.  Patient was hypotensive and in Afib on 9/13, requiring transfer to the ICU.  NGT reinserted and diet changed back to NPO due to aspiration.  Patient has inadequate oral intake since admission and Pharmacy consulted to manage TPN for SBO.  He might require surgery and is at risk for refeeding.  Glucose / Insulin : no hx DM - CBGs 134-151 (8 units SSI / 24 hours)  Electrolytes: low Na, K 4.3, Phos 2.6, Mag 1.9 (K, phos, mag supplemented yesterday outside of TPN)  Renal: SCr < 1, BUN 113 Hepatic: LFTs WNL, alk phos 41, albumin  3.9 on admit now 2.5 Intake / Output; MIVF: NTG output 200, UOP 1525 (0.9 ml/kg/hr) GI Imaging: none since TPN initiation GI Surgeries / Procedures: none since TPN initiation  Central access: 06/11/24 TPN start date: 06/11/24  Nutritional Goals: Goal TPN rate is 70 mL/hr provides 85g AA, 235g CHO, 49g ILE and 1627 kCal per day  RD Estimated Needs Total Energy Estimated Needs: 1700-2000kcal/day Total Protein Estimated Needs: 85-100g/day Total Fluid Estimated Needs: 1.7-2.0L/day  Current Nutrition:  NPO, NGT LIWS   Plan:  Continue TPN @ goal rate of 9mL/hr at 1800  Electrolytes in TPN: Na 100mEq/L, K 20mEq/L, Ca 32mEq/L, Mg 5mEq/L, Phos 22mmol/L, Cl:Ac 2:1 Add standard MVI and trace elements to TPN Continue sensitive SSI Q4H Thiamine  100mg  IV daily x 5 doses Monitor TPN labs on Mon/Thurs - labs in AM - experiencing refeeding - monitor closely  Rankin Sams, PharmD,  BCPS, BCCCP Clinical Pharmacist

## 2024-06-14 NOTE — Progress Notes (Signed)
 PROGRESS NOTE    Gary Rice [facility]  FMW:[id number] DOB: 04-05-1931 DOA: 06/05/2024 PCP: Kip Righter, MD   Brief Narrative:  Patient is a 89 y.o.  male with history of A-fib-presented with SBO and A-fib RVR.   SBO initially managed with conservative measures with some improvement-A-fib RVR managed with Cardizem /heparin  infusion-unfortunately-on 9/13-developed worsening gastric distention-vomited and aspirated, along with A-fib RVR and hypotension.  He was subsequently transferred to the ICU for close monitoring.  Started on antibiotics for aspiration pneumonia, critical care, cardiology consulted and general surgery following.  Assessment & Plan:   Principal Problem:   Small bowel obstruction (HCC) Active Problems:   Paroxysmal atrial fibrillation with RVR (HCC)   Hyperlipidemia   AKI (acute kidney injury) (HCC)   Acute hypoxic respiratory failure (HCC)   Aspiration pneumonia due to gastric secretions (HCC)   Hypotension   Atrial fibrillation with RVR (HCC)  SBO Did not improve with NG tube and conservative measures.  Remains NPO with TPN.  General surgery surgery has discussed with him potential need of surgical intervention tomorrow or day after.  I personally discussed with the patient that due to his comorbidities and age, surgery is likely going to be high risk for him.  To that, he asked  do have any other option then I sent probably no, he said thats why I am willing to go for surgery and I understand the risks   Transient hypotension Likely due to volume loss/vomiting, blood pressure improved without needing vasopressors.   Chronic A-fib with RVR Initially rate controlled with Cardizem /beta-blocker but due to worsening bowel obstruction/aspiration-has developed worsening RVR with hypotension-hence has been switched to amiodarone  infusion. IV heparin  on hold due to concern that some of the vomitus may have been coffee-ground Cardiology following and managing.   Acute  hypoxic respiratory failure secondary to aspiration pneumonia: Likely due to aspiration of vomitus, patient on Zosyn , currently on 6 L of oxygen.  Encourage incentive spirometry.  Wean as able to.  Acute blood loss anemia: Came in with normal hemoglobin, was noted to have hemoptysis as well as coffee-ground emesis which was positive for occult blood, hemoglobin has subsequently dropped to 12.2 but has not required any blood transfusion.  Monitor closely.   AKI Hemodynamically mediated Resolved.  Acute hyponatremia: Hovering around 130.  He is asymptomatic.  Monitor.   Hypokalemia Likely secondary to NGT suctioning. Repleted.   HLD Hold statins for now-resume when oral intake stable.  Aortic aneurysm: 4.6 cm.  Needs surveillance.  GOC: Admitted as a full code however previous hospitalist as well as palliative care had lengthy discussion with the patient and the family and subsequently he was transition to DNR.  DVT prophylaxis: SCD   Code Status: Limited: Do not attempt resuscitation (DNR) -DNR-LIMITED -Do Not Intubate/DNI   Family Communication:  None present at bedside.  Plan of care discussed with patient in length and he/she verbalized understanding and agreed with it.  Status is: Inpatient Remains inpatient appropriate because: Scheduled for surgery tomorrow or day after.  Still with SBO.   Estimated body mass index is 23.53 kg/m as calculated from the following:   Height as of this encounter: 5' 8 (1.727 m).   Weight as of this encounter: 70.2 kg.    Nutritional Assessment: Body mass index is 23.53 kg/m.SABRA Seen by dietician.  I agree with the assessment and plan as outlined below: Nutrition Status: Nutrition Problem: Inadequate oral intake Etiology: inability to eat Signs/Symptoms: NPO status Interventions: TPN  . Skin  Assessment: I have examined the patient's skin and I agree with the wound assessment as performed by the wound care RN as outlined below:     Consultants:  Cardiology, critical care, general surgery  Procedures:  As above  Antimicrobials:  Anti-infectives (From admission, onward)    Start     Dose/Rate Route Frequency Ordered Stop   06/11/24 0915  piperacillin -tazobactam (ZOSYN ) IVPB 3.375 g        3.375 g 12.5 mL/hr over 240 Minutes Intravenous Every 8 hours 06/11/24 0820 06/16/24 0559   06/11/24 0800  cefTRIAXone (ROCEPHIN) 1 g in sodium chloride  0.9 % 100 mL IVPB  Status:  Discontinued        1 g 200 mL/hr over 30 Minutes Intravenous Every 24 hours 06/11/24 0642 06/11/24 0710   06/11/24 0800  cefTRIAXone (ROCEPHIN) 2 g in sodium chloride  0.9 % 100 mL IVPB  Status:  Discontinued        2 g 200 mL/hr over 30 Minutes Intravenous Every 24 hours 06/11/24 0710 06/11/24 0815         Subjective: Patient seen and examined, to my surprise, patient was fully alert and oriented and looked totally different than what he looks on the paper.  He is not passing flatus.  Denies any abdominal pain or nausea or vomiting or chest pain.  Lengthy discussion about the surgery and he is willing to proceed with the surgery understanding the risks.  Objective: Vitals:   06/14/24 0600 06/14/24 0700 06/14/24 0736 06/14/24 0800  BP: (!) 134/93 (!) 137/99  129/89  Pulse: (!) 125 (!) 121  (!) 123  Resp: (!) 25 (!) 26  (!) 21  Temp:   98.3 F (36.8 C)   TempSrc:   Oral   SpO2: 94% 96%  94%  Weight:      Height:        Intake/Output Summary (Last 24 hours) at 06/14/2024 0845 Last data filed at 06/14/2024 0800 Gross per 24 hour  Intake 2111.77 ml  Output 1575 ml  Net 536.77 ml   Filed Weights   06/11/24 0740 06/12/24 0428 06/14/24 0500  Weight: 64.6 kg 66.6 kg 70.2 kg    Examination:  General exam: Appears calm and comfortable  Respiratory system: Clear to auscultation. Respiratory effort normal. Cardiovascular system: S1 & S2 heard, irregularly irregular RR. No JVD, murmurs, rubs, gallops or clicks. No pedal  edema. Gastrointestinal system: Abdomen is nondistended, soft and nontender, has large ventral hernia which is reducible and nontender. No organomegaly or masses felt. Normal bowel sounds heard. Central nervous system: Alert and oriented. No focal neurological deficits. Extremities: Symmetric 5 x 5 power. Skin: No rashes, lesions or ulcers Psychiatry: Judgement and insight appear normal. Mood & affect appropriate.    Data Reviewed: I have personally reviewed following labs and imaging studies  CBC: Recent Labs  Lab 06/10/24 2251 06/11/24 0417 06/11/24 1155 06/12/24 0540 06/13/24 0736 06/14/24 0430  WBC 14.1* 17.6*  --  15.9* 13.6* 16.0*  HGB 14.1 13.1 12.6* 12.4* 10.9* 12.2*  HCT 42.4 38.8* 38.2* 36.3* 32.0* 36.3*  MCV 93.8 92.6  --  93.1 93.6 93.8  PLT 231 188  --  156 149* 166   Basic Metabolic Panel: Recent Labs  Lab 06/11/24 0417 06/11/24 0853 06/11/24 0854 06/12/24 0540 06/13/24 0736 06/14/24 0430  NA 135  --  134* 134* 132* 130*  K 3.4*  --  3.5 3.4* 3.6 4.3  CL 95*  --  96* 99 95* 100  CO2 27  --  26 27 23 24   GLUCOSE 105*  --  141* 128* 295* 147*  BUN 26*  --  24* 19 14 13   CREATININE 0.91  --  0.90 0.85 0.64 0.57*  CALCIUM  8.9  --  8.7* 8.4* 7.8* 8.1*  MG 1.6* 2.2  --  1.8 1.9 1.9  PHOS 2.7 2.5  --  2.1* 2.2* 2.6   GFR: Estimated Creatinine Clearance: 58.2 mL/min (A) (by C-G formula based on SCr of 0.57 mg/dL (L)). Liver Function Tests: Recent Labs  Lab 06/12/24 0540 06/13/24 0736 06/14/24 0430  AST 14* 15 16  ALT 12 13 14   ALKPHOS 37* 37* 41  BILITOT 0.3 0.5 0.5  PROT 5.8* 5.7* 6.0*  ALBUMIN  2.4* 2.6* 2.5*   No results for input(s): LIPASE, AMYLASE in the last 168 hours. No results for input(s): AMMONIA in the last 168 hours. Coagulation Profile: Recent Labs  Lab 06/12/24 1230  INR 1.2   Cardiac Enzymes: No results for input(s): CKTOTAL, CKMB, CKMBINDEX, TROPONINI in the last 168 hours. BNP (last 3 results) No results for  input(s): PROBNP in the last 8760 hours. HbA1C: No results for input(s): HGBA1C in the last 72 hours. CBG: Recent Labs  Lab 06/13/24 1525 06/13/24 1948 06/13/24 2317 06/14/24 0330 06/14/24 0739  GLUCAP 135* 135* 164* 151* 134*   Lipid Profile: Recent Labs    06/12/24 0540  TRIG 45   Thyroid  Function Tests: No results for input(s): TSH, T4TOTAL, FREET4, T3FREE, THYROIDAB in the last 72 hours. Anemia Panel: No results for input(s): VITAMINB12, FOLATE, FERRITIN, TIBC, IRON , RETICCTPCT in the last 72 hours. Sepsis Labs: Recent Labs  Lab 06/11/24 0709 06/11/24 0854  LATICACIDVEN 1.3 1.3    Recent Results (from the past 240 hours)  MRSA Next Gen by PCR, Nasal     Status: None   Collection Time: 06/06/24 12:45 AM   Specimen: Nasal Mucosa; Nasal Swab  Result Value Ref Range Status   MRSA by PCR Next Gen NOT DETECTED NOT DETECTED Final    Comment: (NOTE) The GeneXpert MRSA Assay (FDA approved for NASAL specimens only), is one component of a comprehensive MRSA colonization surveillance program. It is not intended to diagnose MRSA infection nor to guide or monitor treatment for MRSA infections. Test performance is not FDA approved in patients less than 19 years old. Performed at [facility], [address], [place], [place] [address]   MRSA Next Gen by PCR, Nasal     Status: None   Collection Time: 06/11/24  7:43 AM   Specimen: Nasal Mucosa; Nasal Swab  Result Value Ref Range Status   MRSA by PCR Next Gen NOT DETECTED NOT DETECTED Final    Comment: (NOTE) The GeneXpert MRSA Assay (FDA approved for NASAL specimens only), is one component of a comprehensive MRSA colonization surveillance program. It is not intended to diagnose MRSA infection nor to guide or monitor treatment for MRSA infections. Test performance is not FDA approved in patients less than 76 years old. Performed at [facility], [address], [place],  [place] [address]      Radiology Studies: DG CHEST PORT 1 VIEW Result Date: 06/13/2024 CLINICAL DATA:  [id number].  Pneumonia. EXAM: PORTABLE CHEST 1 VIEW COMPARISON:  Portable chest 06/11/2024 at 4:12 p.m. FINDINGS: 7:47 a.m. NGT extends into the stomach to the left with the tip in the proximal fundus. Right PICC again terminates at the superior cavoatrial junction. There are overlying telemetry leads. The  cardiac size is normal. There is aortic tortuosity and atherosclerosis with stable mediastinum. There are small bilateral pleural effusions with overlying patchy airspace disease or atelectasis in the lung bases. The mid and upper lungs remain clear apart from linear perihilar atelectasis. Overall aeration seems unchanged.  No new osseous finding. IMPRESSION: 1. No significant change in the appearance of the chest. Small bilateral pleural effusions with overlying patchy airspace disease or atelectasis in the lung bases. 2. Aortic atherosclerosis. Electronically Signed   By: Francis Quam M.D.   On: 06/13/2024 07:59    Scheduled Meds:  Chlorhexidine  Gluconate Cloth  6 each Topical Q0600   digoxin   0.125 mg Intravenous Daily   Influenza vac split trivalent PF  0.5 mL Intramuscular Tomorrow-1000   insulin  aspart  0-9 Units Subcutaneous Q4H   ipratropium  2 spray Each Nare BID   methocarbamol   500 mg Per Tube Once   pantoprazole  (PROTONIX ) IV  40 mg Intravenous Q12H   sodium chloride  flush  10-40 mL Intracatheter Q12H   sodium chloride  flush  3 mL Intravenous Q12H   thiamine  (VITAMIN B1) injection  100 mg Intravenous Daily   Continuous Infusions:  amiodarone  60 mg/hr (06/14/24 0600)   heparin  1,050 Units/hr (06/14/24 0754)   piperacillin -tazobactam (ZOSYN )  IV 12.5 mL/hr at 06/14/24 0600   TPN ADULT (ION) 70 mL/hr at 06/14/24 0600   TPN ADULT (ION)       LOS: 9 days   Fredia Skeeter, MD [facility]  06/14/2024, 8:45 AM   *Please note that this is a verbal dictation therefore any spelling  or grammatical errors are due to the Dragon Medical One system interpretation.  Please page via Amion and do not message via secure chat for urgent patient care matters. Secure chat can be used for non urgent patient care matters.  How to contact the [facility] Attending or Consulting provider 7A - 7P or covering provider during after hours 7P -7A, for this patient?  Check the care team in [facility] and look for a) attending/consulting TRH provider listed and b) the TR[facility] team listed. Page or secure chat 7A-7P. Log into [email or web address] and use [place]'s universal password to access. If you do not have the password, please contact the hospital operator. Locate the [facility] provider you are looking for under [facility] and page to a number that you can be directly reached. If you still have difficulty reaching the provider, please page the [facility] (Director on Call) for the Hospitalists listed on amion for assistance.

## 2024-06-14 NOTE — Progress Notes (Signed)
 Progress Note     Subjective: Pt denies abdominal pain currently. Weaning down on O2  Objective: Vital signs in last 24 hours: Temp:  [97.8 F (36.6 C)-98.5 F (36.9 C)] 98.3 F (36.8 C) (09/17 0736) Pulse Rate:  [101-125] 125 (09/17 0600) Resp:  [14-41] 25 (09/17 0600) BP: (122-140)/(93-112) 134/93 (09/17 0600) SpO2:  [92 %-99 %] 94 % (09/17 0600) Weight:  [70.2 kg] 70.2 kg (09/17 0500) Last BM Date : 06/09/24  Intake/Output from previous day: 09/16 0701 - 09/17 0700 In: 2111.8 [I.V.:1832.2; NG/GT:120; IV Piggyback:159.6] Out: 1725 [Urine:1525; Emesis/NG output:200] Intake/Output this shift: No intake/output data recorded.  PE: General: pleasant, WD, elderly male who is laying in bed in NAD Heart: irregularly irregular in the 100-120s Lungs: normal effort on HFNC at 20 L/min, no wheezing Abd: soft, NT, hernia soft, NGT with thin dark drainage Psych: A&Ox4 with an appropriate affect.    Lab Results:  Recent Labs    06/13/24 0736 06/14/24 0430  WBC 13.6* 16.0*  HGB 10.9* 12.2*  HCT 32.0* 36.3*  PLT 149* 166   BMET Recent Labs    06/13/24 0736 06/14/24 0430  NA 132* 130*  K 3.6 4.3  CL 95* 100  CO2 23 24  GLUCOSE 295* 147*  BUN 14 13  CREATININE 0.64 0.57*  CALCIUM  7.8* 8.1*   PT/INR Recent Labs    06/12/24 1230  LABPROT 15.9*  INR 1.2   CMP     Component Value Date/Time   NA 130 (L) 06/14/2024 0430   NA 143 04/23/2022 1454   K 4.3 06/14/2024 0430   CL 100 06/14/2024 0430   CO2 24 06/14/2024 0430   GLUCOSE 147 (H) 06/14/2024 0430   BUN 13 06/14/2024 0430   BUN 23 04/23/2022 1454   CREATININE 0.57 (L) 06/14/2024 0430   CALCIUM  8.1 (L) 06/14/2024 0430   PROT 6.0 (L) 06/14/2024 0430   ALBUMIN  2.5 (L) 06/14/2024 0430   AST 16 06/14/2024 0430   ALT 14 06/14/2024 0430   ALKPHOS 41 06/14/2024 0430   BILITOT 0.5 06/14/2024 0430   GFRNONAA >60 06/14/2024 0430   GFRAA  08/14/2007 0312    >60        The eGFR has been calculated using the  MDRD equation. This calculation has not been validated in all clinical   Lipase     Component Value Date/Time   LIPASE 18 06/05/2024 1241       Studies/Results: DG CHEST PORT 1 VIEW Result Date: 06/13/2024 CLINICAL DATA:  [id number].  Pneumonia. EXAM: PORTABLE CHEST 1 VIEW COMPARISON:  Portable chest 06/11/2024 at 4:12 p.m. FINDINGS: 7:47 a.m. NGT extends into the stomach to the left with the tip in the proximal fundus. Right PICC again terminates at the superior cavoatrial junction. There are overlying telemetry leads. The cardiac size is normal. There is aortic tortuosity and atherosclerosis with stable mediastinum. There are small bilateral pleural effusions with overlying patchy airspace disease or atelectasis in the lung bases. The mid and upper lungs remain clear apart from linear perihilar atelectasis. Overall aeration seems unchanged.  No new osseous finding. IMPRESSION: 1. No significant change in the appearance of the chest. Small bilateral pleural effusions with overlying patchy airspace disease or atelectasis in the lung bases. 2. Aortic atherosclerosis. Electronically Signed   By: Francis Quam M.D.   On: 06/13/2024 07:59    Anti-infectives: Anti-infectives (From admission, onward)    Start     Dose/Rate Route Frequency Ordered Stop   06/11/24  0915  piperacillin -tazobactam (ZOSYN ) IVPB 3.375 g        3.375 g 12.5 mL/hr over 240 Minutes Intravenous Every 8 hours 06/11/24 0820 06/16/24 0559   06/11/24 0800  cefTRIAXone (ROCEPHIN) 1 g in sodium chloride  0.9 % 100 mL IVPB  Status:  Discontinued        1 g 200 mL/hr over 30 Minutes Intravenous Every 24 hours 06/11/24 0642 06/11/24 0710   06/11/24 0800  cefTRIAXone (ROCEPHIN) 2 g in sodium chloride  0.9 % 100 mL IVPB  Status:  Discontinued        2 g 200 mL/hr over 30 Minutes Intravenous Every 24 hours 06/11/24 0710 06/11/24 0815        Assessment/Plan SBO - bowel obstruction does not appear to be resolving - in ICU with  aspiration PNA - supplemental oxygen weaning - on TPN, would continue in setting of ongoing obstruction with NGT to LIWS - palliative saw yesterday and patient would like to be a DNR w/ limited interventions. Would not like to be intubated in the setting of a CODE but okay with everything up to that  - OR for SBO is not emergent at this time, would like to continue to try to get pulmonary status better prior to any surgical intervention - started conversation today about timing of surgery - maybe tomorrow or Friday?  He is not going to be completely ready but will try to get him as optimized as possible - will continue to follow closely and plan OR for SBO when more appropriate  FEN: NPO, TPN, NGT to LIWS VTE: hep gtt ok  ID: [email or web address]  for aspiration PNA  - per TRH/CCM -  ?Aspiration PNA - currently on HFNC Chronic A. Fib with RVR AKI HLD    LOS: 9 days   I reviewed Consultant palliative, pulmonolgy, cardiology notes, hospitalist notes, last 24 h vitals and pain scores, last 48 h intake and output, last 24 h labs and trends, and last 24 h imaging results.  This care required high level of medical decision making.    Gary JINNY Foy, MD  [facility] 06/14/2024, 8:26 AM Please see Amion for pager number during day hours 7:00am-4:30pm

## 2024-06-14 NOTE — Progress Notes (Signed)
 Cardiology Progress Note  Patient ID: Gary Rice [facility] MRN: [id number] DOB: Jan 28, 1931 Date of Encounter: 06/14/2024 Primary Cardiologist: None  Subjective   Chief Complaint: SOB  HPI: Remains in A-fib heart rate 110 to 130 bpm.  Short of breath on exam.  No flatus or bowel movement.  ROS:  All other ROS reviewed and negative. Pertinent positives noted in the HPI.     Telemetry  Overnight telemetry shows atrial fibrillation heart rate 110 to 130 bpm, which I personally reviewed.   Physical Exam   Vitals:   06/14/24 0600 06/14/24 0700 06/14/24 0736 06/14/24 0800  BP: (!) 134/93 (!) 137/99  129/89  Pulse: (!) 125 (!) 121  (!) 123  Resp: (!) 25 (!) 26  (!) 21  Temp:   98.3 F (36.8 C)   TempSrc:   Oral   SpO2: 94% 96%  94%  Weight:      Height:        Intake/Output Summary (Last 24 hours) at 06/14/2024 1052 Last data filed at 06/14/2024 1000 Gross per 24 hour  Intake 2604.62 ml  Output 1955 ml  Net 649.62 ml       06/14/2024    5:00 AM 06/12/2024    4:28 AM 06/11/2024    7:40 AM  Last 3 Weights  Weight (lbs) 154 lb 12.2 oz 146 lb 13.2 oz 142 lb 6.7 oz  Weight (kg) 70.2 kg 66.6 kg 64.6 kg    Body mass index is 23.53 kg/m.  General: Well nourished, well developed, in no acute distress Head: Atraumatic, normal size  Eyes: PEERLA, EOMI  Neck: Supple, JVD 7 to 8 cm of water  Endocrine: No thryomegaly Cardiac: Normal S1, S2; irregular rhythm, no murmurs Lungs: Diminished breath sounds bilaterally Abd: Soft, nontender, no hepatomegaly  Ext: No edema, pulses 2+ Musculoskeletal: No deformities, BUE and BLE strength normal and equal Skin: Warm and dry, no rashes   Neuro: Alert and oriented to person, place, time, and situation, CNII-XII grossly intact, no focal deficits  Psych: Normal mood and affect   Cardiac Studies  TTE 06/10/2024  1. Technically difficult study with limited views. Left ventricular  ejection fraction, by estimation, is 50 to 55%. The left ventricle  has low  normal function. Left ventricular endocardial border not optimally defined  to evaluate regional wall motion.  There is mild left ventricular hypertrophy. Left ventricular diastolic  parameters are indeterminate.   2. Right ventricular systolic function was not well visualized. The right  ventricular size is not well visualized.   3. The mitral valve is degenerative. Trivial mitral valve regurgitation.   4. The aortic valve was not well visualized. Aortic valve regurgitation  is trivial. No aortic stenosis is present.   Patient Profile  Clinten Howk is a 89 y.o. male with paroxysmal atrial fibrillation, SVT, ascending aortic aneurysm, hyperlipidemia admitted on 06/05/2024 with small bowel obstruction and A-fib with RVR.  Course complicated by GI bleed and aspiration pneumonia.  Assessment & Plan   # A-fib with RVR - Currently in ICU.  Strict n.p.o. due to small bowel obstruction. - Has had issues with hypotension and pneumonia. - Continue amiodarone  drip.  On digoxin  0.125 mg IV daily.  He is rate controlled but not perfect.  For now we will just continue with current regimen.  Needs to get through small bowel obstruction surgery.  I have my concerns about how well he will do given his advanced age but he has limited options. - We did challenge  him on heparin .  Blood counts are stable.  This can be held for surgery.  # Small bowel obstruction - Strict NPO.  On TPN.  Surgery tomorrow Friday.  # GI Bleed? - There was concerns for GI bleed.  Hemoglobin has been stable.  Challenge on heparin .  Could be related to critical illness.  # Sepsis # Aspiration pneumonia # Acute hypoxic respiratory failure # Hypotension - On digoxin  for rate control with limited success.  On amiodarone  drip.  Currently antibiotics for pneumonia.  Respiratory status not great.  Would benefit from some diuresis.  I will defer this to the primary team.  # Ascending aortic aneurysm - 46 mm. Stable.       For questions or updates, please contact [facility] Please consult [email or web address] for contact info under         Signed, Darryle T. Barbaraann, MD, [facility]  [facility]  06/14/2024 10:52 AM

## 2024-06-14 NOTE — Progress Notes (Signed)
 Palliative Care Progress Note, Assessment & Plan   Patient Name: [facility]       Date: 06/14/2024 DOB: June 30, 1931  Age: 89 y.o. MRN#: [id number] Attending Physician: Vernon Ranks, MD Primary Care Physician: Kip Righter, MD Admit Date: 06/05/2024  Subjective: Patient is sitting up in bed sleeping but easily awakens to my presence.  Nasal cannula in place as well as NG to left nare.  No family or friends present during my visit.  HPI: 89 y.o. 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   Upon presentation to ED, patient found to be in A-fib with RVR.  CT revealed high-grade small bowel obstruction, inguinal hernia containing nonobstructive sigmoid colon, and probable cholelithiasis.   Patient is being treated for small bowel obstruction, A-fib with RVR, hyperlipidemia, and AKI.   General surgery was consulted.  No emergent surgical needs.  NPO, IVF, and NGT to LIWS.  Plan remains to continue withg conservative measures and watchful waiting.  If patient fails to improve he may require laparotomy, LOA, hernia repair.   PMT was consulted to support patient with goals of care discussions.   Summary of counseling/coordination of care: Extensive chart review completed prior to meeting patient including labs, vital signs, imaging, progress notes, orders, and available advanced directive documents from current and previous encounters.   After  reviewing the patient's chart and assessing the patient at bedside, I spoke with patient in regards to symptom management and goals of care.   Discussed next steps in patient's plan of care.  Reviewed conservative measures for SBO-IVF, NG for bowel decompression, and TPN-have not resolved his SBO.  While surgery is not emergent, attempts to optimize his respiratory status continue.  He shares understanding and says he is ready to get it over with.  Briefly discussed risks and benefits of undergoing surgery at advanced age with his comorbidities.  He shares that the alternative means he would die and he is not ready for that.  He continues to endorse DNR limited status.  In the event the patient is unable to speak for himself, he continues to endorse he would want his wife Cy as well as his brother-in-law Garrel to be his next of kin decision makers.  Symptoms assessed.  Patient shares that the NG tube has provided significant relief from his abdominal discomfort-specifically over the last 24 hours.  He shares that mobilizing/standing up from bedside late last night has significantly improved his NG output-and subsequently he is more comfortable today.  No adjustment to plan of care at this time.  PMT will step back from daily visits, monitor the patient peripherally, continue to follow and support by intervening/engaging where appropriate.  Physical Exam Vitals reviewed.  Constitutional:      General: He is not in acute distress.    Appearance: He is normal weight.  HENT:     Head: Normocephalic.  Nose: Nose normal.     Mouth/Throat:     Mouth: Mucous membranes are moist.  Eyes:     Pupils: Pupils are equal, round, and reactive to light.  Pulmonary:     Effort: Pulmonary effort is normal.  Abdominal:     Palpations: Abdomen is soft.  Skin:    General: Skin is warm and dry.  Neurological:     Mental Status: He is alert and oriented to person, place, and time.  Psychiatric:         Mood and Affect: Mood normal.        Behavior: Behavior normal.        Thought Content: Thought content normal.        Judgment: Judgment normal.             Visit includes: Detailed review of medical records (labs, imaging, vital signs), medically appropriate exam (mental status, respiratory, cardiac, skin), discussed with treatment team, counseling and educating patient, family and staff, documenting clinical information, medication management and coordination of care.  Lamarr L. Arvid, DNP, FNP-BC Palliative Medicine Team

## 2024-06-14 NOTE — Progress Notes (Signed)
 PT Cancellation Note  Patient Details Name: Gary Rice MRN: [id number] DOB: 1931-02-03   Cancelled Treatment:    Reason Eval/Treat Not Completed: (P) Patient declined, no reason specified Pt reports that he had increased coughing after working with therapy on Monday which caused back spasms, and that his back still hurts now so he does not want to work with therapy right now. Tried to educate on need for movement. Pt reports he does not want to start coughing again. PT will follow back this afternoon as able.   Kaydyn Sayas B. Fleeta Lapidus PT, DPT Acute Rehabilitation Services Please use secure chat or  Call Office [phone]    [name] 06/14/2024, 10:30 AM

## 2024-06-14 NOTE — Progress Notes (Signed)
 PHARMACY - ANTICOAGULATION CONSULT NOTE  Pharmacy Consult for heparin  Indication: atrial fibrillation  No Known Allergies  Patient Measurements: Height: 5' 8 (172.7 cm) Weight: 70.2 kg (154 lb 12.2 oz) IBW/kg (Calculated) : 68.4 HEPARIN  DW (KG): 64.6  Vital Signs: Temp: 97.8 F (36.6 C) (09/17 0334) Temp Source: Oral (09/17 0334) BP: 134/93 (09/17 0600) Pulse Rate: 125 (09/17 0600)  Labs: Recent Labs    06/11/24 0709 06/11/24 0854 06/12/24 0540 06/12/24 1230 06/13/24 0736 06/13/24 1943 06/14/24 0430  HGB  --    < > 12.4*  --  10.9*  --  12.2*  HCT  --    < > 36.3*  --  32.0*  --  36.3*  PLT  --   --  156  --  149*  --  166  LABPROT  --   --   --  15.9*  --   --   --   INR  --   --   --  1.2  --   --   --   HEPARINUNFRC  --   --   --   --   --  0.34 0.28*  CREATININE  --    < > 0.85  --  0.64  --  0.57*  TROPONINIHS 19*  --   --   --   --   --   --    < > = values in this interval not displayed.    Estimated Creatinine Clearance: 58.2 mL/min (A) (by C-G formula based on SCr of 0.57 mg/dL (L)).   Medical History: Past Medical History:  Diagnosis Date   Astigmatism    Basal cell carcinoma    Inguinal hernia    bilateral   Macular degeneration, age related, nonexudative    bilateral   SVT (supraventricular tachycardia) (HCC)      Assessment: 89 yo M presenting with abdominal pain, constipation, in Afib RVR, hx of afib on Eliquis  PTA with last dose 9/8 AM, on hold for SBO eval.  Pharmacy is consulted for heparin  dosing.   Heparin  has been on hold for hemoptysis. Cards ordered to resume 9/16.   Hgb 12.2, plt 166. no further issues with hemoptysis or other s/sx bleeding reported. Heparin  level trending down and slightly subtherapeutic despite small dose increase.   Goal of Therapy:  Heparin  level: 0.3-0.5 Monitor platelets by anticoagulation protocol: Yes   Plan:  Increase heparin  infusion to 1050 units/hr Check heparin  level with AM labs and daily  while on heparin  Continue to monitor H&H and platelets  Thank you for allowing pharmacy to be a part of this patient's care.  Rankin Sams, PharmD, BCPS, BCCCP Clinical Pharmacist

## 2024-06-15 ENCOUNTER — Inpatient Hospital Stay (HOSPITAL_COMMUNITY)

## 2024-06-15 DIAGNOSIS — I480 Paroxysmal atrial fibrillation: Secondary | ICD-10-CM | POA: Diagnosis not present

## 2024-06-15 DIAGNOSIS — K56609 Unspecified intestinal obstruction, unspecified as to partial versus complete obstruction: Secondary | ICD-10-CM | POA: Diagnosis not present

## 2024-06-15 DIAGNOSIS — I77810 Thoracic aortic ectasia: Secondary | ICD-10-CM | POA: Diagnosis not present

## 2024-06-15 DIAGNOSIS — Z4682 Encounter for fitting and adjustment of non-vascular catheter: Secondary | ICD-10-CM | POA: Diagnosis not present

## 2024-06-15 DIAGNOSIS — I4891 Unspecified atrial fibrillation: Secondary | ICD-10-CM | POA: Diagnosis not present

## 2024-06-15 DIAGNOSIS — Z515 Encounter for palliative care: Secondary | ICD-10-CM

## 2024-06-15 DIAGNOSIS — J90 Pleural effusion, not elsewhere classified: Secondary | ICD-10-CM | POA: Diagnosis not present

## 2024-06-15 DIAGNOSIS — I509 Heart failure, unspecified: Secondary | ICD-10-CM | POA: Diagnosis not present

## 2024-06-15 LAB — CBC WITH DIFFERENTIAL/PLATELET
Abs Immature Granulocytes: 0.23 K/uL — ABNORMAL HIGH (ref 0.00–0.07)
Basophils Absolute: 0.1 K/uL (ref 0.0–0.1)
Basophils Relative: 0 %
Eosinophils Absolute: 0.1 K/uL (ref 0.0–0.5)
Eosinophils Relative: 1 %
HCT: 34.4 % — ABNORMAL LOW (ref 39.0–52.0)
Hemoglobin: 11.7 g/dL — ABNORMAL LOW (ref 13.0–17.0)
Immature Granulocytes: 1 %
Lymphocytes Relative: 6 %
Lymphs Abs: 1.2 K/uL (ref 0.7–4.0)
MCH: 31.7 pg (ref 26.0–34.0)
MCHC: 34 g/dL (ref 30.0–36.0)
MCV: 93.2 fL (ref 80.0–100.0)
Monocytes Absolute: 1.6 K/uL — ABNORMAL HIGH (ref 0.1–1.0)
Monocytes Relative: 9 %
Neutro Abs: 14.9 K/uL — ABNORMAL HIGH (ref 1.7–7.7)
Neutrophils Relative %: 83 %
Platelets: 203 K/uL (ref 150–400)
RBC: 3.69 MIL/uL — ABNORMAL LOW (ref 4.22–5.81)
RDW: 13 % (ref 11.5–15.5)
WBC: 18 K/uL — ABNORMAL HIGH (ref 4.0–10.5)
nRBC: 0 % (ref 0.0–0.2)

## 2024-06-15 LAB — BRAIN NATRIURETIC PEPTIDE: B Natriuretic Peptide: 949.3 pg/mL — ABNORMAL HIGH (ref 0.0–100.0)

## 2024-06-15 LAB — COMPREHENSIVE METABOLIC PANEL WITH GFR
ALT: 18 U/L (ref 0–44)
AST: 19 U/L (ref 15–41)
Albumin: 2.1 g/dL — ABNORMAL LOW (ref 3.5–5.0)
Alkaline Phosphatase: 43 U/L (ref 38–126)
Anion gap: 12 (ref 5–15)
BUN: 14 mg/dL (ref 8–23)
CO2: 24 mmol/L (ref 22–32)
Calcium: 8 mg/dL — ABNORMAL LOW (ref 8.9–10.3)
Chloride: 96 mmol/L — ABNORMAL LOW (ref 98–111)
Creatinine, Ser: 0.59 mg/dL — ABNORMAL LOW (ref 0.61–1.24)
GFR, Estimated: >60 mL/min (ref >60)
Glucose, Bld: 194 mg/dL — ABNORMAL HIGH (ref 70–99)
Potassium: 4.2 mmol/L (ref 3.5–5.1)
Sodium: 132 mmol/L — ABNORMAL LOW (ref 135–145)
Total Bilirubin: 0.5 mg/dL (ref 0.0–1.2)
Total Protein: 5.8 g/dL — ABNORMAL LOW (ref 6.5–8.1)

## 2024-06-15 LAB — CBC
HCT: 35.9 % — ABNORMAL LOW (ref 39.0–52.0)
Hemoglobin: 11.8 g/dL — ABNORMAL LOW (ref 13.0–17.0)
MCH: 32.2 pg (ref 26.0–34.0)
MCHC: 32.9 g/dL (ref 30.0–36.0)
MCV: 97.8 fL (ref 80.0–100.0)
Platelets: 184 K/uL (ref 150–400)
RBC: 3.67 MIL/uL — ABNORMAL LOW (ref 4.22–5.81)
RDW: 13.3 % (ref 11.5–15.5)
WBC: 15.9 K/uL — ABNORMAL HIGH (ref 4.0–10.5)
nRBC: 0.2 % (ref 0.0–0.2)

## 2024-06-15 LAB — GLUCOSE, CAPILLARY
Glucose-Capillary: 110 mg/dL — ABNORMAL HIGH (ref 70–99)
Glucose-Capillary: 112 mg/dL — ABNORMAL HIGH (ref 70–99)
Glucose-Capillary: 112 mg/dL — ABNORMAL HIGH (ref 70–99)
Glucose-Capillary: 123 mg/dL — ABNORMAL HIGH (ref 70–99)
Glucose-Capillary: 126 mg/dL — ABNORMAL HIGH (ref 70–99)
Glucose-Capillary: 127 mg/dL — ABNORMAL HIGH (ref 70–99)

## 2024-06-15 LAB — MAGNESIUM: Magnesium: 1.8 mg/dL (ref 1.7–2.4)

## 2024-06-15 LAB — HEPARIN LEVEL (UNFRACTIONATED): Heparin Unfractionated: 0.26 [IU]/mL — ABNORMAL LOW (ref 0.30–0.70)

## 2024-06-15 LAB — PHOSPHORUS: Phosphorus: 2.5 mg/dL (ref 2.5–4.6)

## 2024-06-15 MED ORDER — FUROSEMIDE 10 MG/ML IJ SOLN
40.0000 mg | Freq: Four times a day (QID) | INTRAMUSCULAR | Status: AC
  Administered 2024-06-15 (×2): 40 mg via INTRAVENOUS
  Filled 2024-06-15 (×2): qty 4

## 2024-06-15 MED ORDER — TRAVASOL 10 % IV SOLN
INTRAVENOUS | Status: AC
  Filled 2024-06-15: qty 848.4

## 2024-06-15 MED ORDER — OXYMETAZOLINE HCL 0.05 % NA SOLN
2.0000 | Freq: Two times a day (BID) | NASAL | Status: AC | PRN
  Administered 2024-06-15 (×2): 2 via NASAL
  Filled 2024-06-15: qty 30

## 2024-06-15 NOTE — Progress Notes (Signed)
 Progress Note     Subjective: Nosebleed.  Objective: Vital signs in last 24 hours: Temp:  [97.9 F (36.6 C)-99.5 F (37.5 C)] 97.9 F (36.6 C) (09/18 0747) Pulse Rate:  [112-126] 119 (09/18 0800) Resp:  [18-28] 24 (09/18 0800) BP: (102-140)/(78-106) 108/84 (09/18 0800) SpO2:  [90 %-96 %] 91 % (09/18 0800) Weight:  [68.7 kg] 68.7 kg (09/18 0410) Last BM Date : 06/09/24  Intake/Output from previous day: 09/17 0701 - 09/18 0700 In: 2886.3 [I.V.:2707.4; NG/GT:30; IV Piggyback:148.9] Out: 2900 [Urine:1970; Emesis/NG output:730] Intake/Output this shift: Total I/O In: 124.4 [I.V.:111.8; IV Piggyback:12.5] Out: -   PE: General: pleasant, WD, elderly male who is laying in bed in NAD Heart: irregularly irregular in the 100-120s Lungs: normal effort on HFNC at 20 L/min, no wheezing Abd: soft, NT, hernia soft, NGT with thin dark drainage Psych: A&Ox4 with an appropriate affect.    Lab Results:  Recent Labs    06/14/24 0430 06/15/24 0255  WBC 16.0* 15.9*  HGB 12.2* 11.8*  HCT 36.3* 35.9*  PLT 166 184   BMET Recent Labs    06/14/24 0430 06/15/24 0450  NA 130* 132*  K 4.3 4.2  CL 100 96*  CO2 24 24  GLUCOSE 147* 194*  BUN 13 14  CREATININE 0.57* 0.59*  CALCIUM  8.1* 8.0*   PT/INR Recent Labs    06/12/24 1230  LABPROT 15.9*  INR 1.2   CMP     Component Value Date/Time   NA 132 (L) 06/15/2024 0450   NA 143 04/23/2022 1454   K 4.2 06/15/2024 0450   CL 96 (L) 06/15/2024 0450   CO2 24 06/15/2024 0450   GLUCOSE 194 (H) 06/15/2024 0450   BUN 14 06/15/2024 0450   BUN 23 04/23/2022 1454   CREATININE 0.59 (L) 06/15/2024 0450   CALCIUM  8.0 (L) 06/15/2024 0450   PROT 5.8 (L) 06/15/2024 0450   ALBUMIN  2.1 (L) 06/15/2024 0450   AST 19 06/15/2024 0450   ALT 18 06/15/2024 0450   ALKPHOS 43 06/15/2024 0450   BILITOT 0.5 06/15/2024 0450   GFRNONAA >60 06/15/2024 0450   GFRAA  08/14/2007 0312    >60        The eGFR has been calculated using the MDRD  equation. This calculation has not been validated in all clinical   Lipase     Component Value Date/Time   LIPASE 18 06/05/2024 1241       Studies/Results: No results found.   Anti-infectives: Anti-infectives (From admission, onward)    Start     Dose/Rate Route Frequency Ordered Stop   06/11/24 0915  piperacillin -tazobactam (ZOSYN ) IVPB 3.375 g        3.375 g 12.5 mL/hr over 240 Minutes Intravenous Every 8 hours 06/11/24 0820 06/16/24 0559   06/11/24 0800  cefTRIAXone (ROCEPHIN) 1 g in sodium chloride  0.9 % 100 mL IVPB  Status:  Discontinued        1 g 200 mL/hr over 30 Minutes Intravenous Every 24 hours 06/11/24 0642 06/11/24 0710   06/11/24 0800  cefTRIAXone (ROCEPHIN) 2 g in sodium chloride  0.9 % 100 mL IVPB  Status:  Discontinued        2 g 200 mL/hr over 30 Minutes Intravenous Every 24 hours 06/11/24 0710 06/11/24 0815        Assessment/Plan SBO - bowel obstruction does not appear to be resolving - in ICU with aspiration PNA - supplemental oxygen weaning - on TPN, would continue in setting of ongoing obstruction  with NGT to LIWS - I do not think he will ever be fully ready for the OR, but I think he has to go to the OR to address his bowel obstruction or deal with an NG tube for the rest of his life.  I discussed this with the patient who voiced understanding and agreement.  He would like to have surgery and attempt to address this bowel obstruction.  I recommended laparoscopic, possibly open, abdominal exploration with possible bowel resection.  We discussed the surgery itself as well as its risk, benefits, and alternatives.  Risk discussed include but not limited to the risk of infection, bleeding, damage nearby structures, need for bowel resection, along intubation, prolonged ICU stay, and death.  He understands he is high risk for this procedure.  After full discussion all questions answered the patient voiced understanding and consent to proceed.    - Unless there  are objections from the medical or cardiology teams, I feel proceeding with surgery tomorrow would make the most sense.  Will work to schedule surgery.  We will need him to stop his heparin  drip 8 hours prior to surgery.  FEN: NPO, TPN, NGT to LIWS VTE: hep gtt ok  ID: Zosyn  for aspiration PNA  - per TRH/CCM -  ?Aspiration PNA - currently on HFNC Chronic A. Fib with RVR AKI HLD    LOS: 10 days   I reviewed Consultant palliative, pulmonolgy, cardiology notes, hospitalist notes, last 24 h vitals and pain scores, last 48 h intake and output, last 24 h labs and trends, and last 24 h imaging results.  This care required high level of medical decision making.    Deward JINNY Foy, MD  [facility] Surgery 06/15/2024, 9:25 AM Please see Amion for pager number during day hours 7:00am-4:30pm

## 2024-06-15 NOTE — Progress Notes (Signed)
 RN notified of patient complaining of bleeding. Upon assessment, pt appears to have bloody drainage from oral and nasal cavity.   MD notified and present at bedside. Per Dr. Charlton; apply Afrin. Ok to continue heparin  drip and low intermittent suctioning. Order to stop heparin  drip if bloody secretions continue after afrin application.

## 2024-06-15 NOTE — Progress Notes (Addendum)
 PHARMACY - ANTICOAGULATION CONSULT NOTE  Pharmacy Consult for heparin  Indication: atrial fibrillation  No Known Allergies  Patient Measurements: Height: 5' 8 (172.7 cm) Weight: 68.7 kg (151 lb 7.3 oz) IBW/kg (Calculated) : 68.4 HEPARIN  DW (KG): 64.6  Vital Signs: Temp: 99.2 F (37.3 C) (09/18 0342) Temp Source: Oral (09/18 0342) BP: 111/84 (09/18 0600) Pulse Rate: 120 (09/18 0600)  Labs: Recent Labs    06/12/24 1230 06/13/24 0736 06/13/24 0736 06/13/24 1943 06/14/24 0430 06/15/24 0255 06/15/24 0450  HGB  --  10.9*   < >  --  12.2* 11.8*  --   HCT  --  32.0*  --   --  36.3* 35.9*  --   PLT  --  149*  --   --  166 184  --   LABPROT 15.9*  --   --   --   --   --   --   INR 1.2  --   --   --   --   --   --   HEPARINUNFRC  --   --   --  0.34 0.28* 0.26*  --   CREATININE  --  0.64  --   --  0.57*  --  0.59*   < > = values in this interval not displayed.    Estimated Creatinine Clearance: 58.2 mL/min (A) (by C-G formula based on SCr of 0.59 mg/dL (L)).   Medical History: Past Medical History:  Diagnosis Date   Astigmatism    Basal cell carcinoma    Inguinal hernia    bilateral   Macular degeneration, age related, nonexudative    bilateral   SVT (supraventricular tachycardia) (HCC)      Assessment: 89 yo M presenting with abdominal pain, constipation, in Afib RVR, hx of afib on Eliquis  PTA with last dose 9/8 AM, on hold for SBO eval.  Pharmacy is consulted for heparin  dosing.   Heparin  has been on hold for hemoptysis. Cards ordered to resume 9/16.   Hgb 11.8, plt 184. Coughing up bright red blood and epistaxis overnight. MD okay with continuing heparin  for now.  Heparin  level continues to trend down and slightly subtherapeutic despite small dose increase (level = 0.26)  Goal of Therapy:  Heparin  level: 0.3-0.5 Monitor platelets by anticoagulation protocol: Yes   Plan:  Pause heparin  infusion for now per conversation with MD and considering resuming  heparin  4 hours after bleeding cessation  Continue to monitor H&H and platelets  Thank you for allowing pharmacy to be a part of this patient's care.  Rankin Sams, PharmD, BCPS, BCCCP Clinical Pharmacist

## 2024-06-15 NOTE — Progress Notes (Signed)
 Cardiology Progress Note  Patient ID: Gary Rice [facility] MRN: [id number] DOB: August 18, 1931 Date of Encounter: 06/15/2024 Primary Cardiologist: None  Subjective   Chief Complaint: SOB  HPI: Respiratory status is improved.  Remains in atrial flutter.  Bowel obstruction surgery tomorrow.  Hemoptysis overnight.  Heparin  is now held.  ROS:  All other ROS reviewed and negative. Pertinent positives noted in the HPI.     Telemetry  Overnight telemetry shows atrial flutter heart rate 110 to 120 bpm, which I personally reviewed.    Physical Exam   Vitals:   06/15/24 0600 06/15/24 0700 06/15/24 0747 06/15/24 0800  BP: 111/84 102/78  108/84  Pulse: (!) 120 (!) 118  (!) 119  Resp: (!) 25 (!) 21  (!) 24  Temp:   97.9 F (36.6 C)   TempSrc:   Oral   SpO2: 95% 95%  91%  Weight:      Height:        Intake/Output Summary (Last 24 hours) at 06/15/2024 1028 Last data filed at 06/15/2024 1000 Gross per 24 hour  Intake 2613.61 ml  Output 2520 ml  Net 93.61 ml       06/15/2024    4:10 AM 06/14/2024    5:00 AM 06/12/2024    4:28 AM  Last 3 Weights  Weight (lbs) 151 lb 7.3 oz 154 lb 12.2 oz 146 lb 13.2 oz  Weight (kg) 68.7 kg 70.2 kg 66.6 kg    Body mass index is 23.03 kg/m.  General: Ill-appearing Head: Atraumatic, normal size  Eyes: PEERLA, EOMI  Neck: Supple, no JVD Endocrine: No thryomegaly Cardiac: Normal S1, S2; irregular rhythm, tachycardia Lungs: Diminished breath sounds bilaterally Abd: Distended abdomen Ext: No edema, pulses 2+ Musculoskeletal: No deformities, BUE and BLE strength normal and equal Skin: Warm and dry, no rashes   Neuro: Alert and oriented to person, place, time, and situation, CNII-XII grossly intact, no focal deficits  Psych: Normal mood and affect   Cardiac Studies  TTE 06/10/2024  1. Technically difficult study with limited views. Left ventricular  ejection fraction, by estimation, is 50 to 55%. The left ventricle has low  normal function. Left  ventricular endocardial border not optimally defined  to evaluate regional wall motion.  There is mild left ventricular hypertrophy. Left ventricular diastolic  parameters are indeterminate.   2. Right ventricular systolic function was not well visualized. The right  ventricular size is not well visualized.   3. The mitral valve is degenerative. Trivial mitral valve regurgitation.   4. The aortic valve was not well visualized. Aortic valve regurgitation  is trivial. No aortic stenosis is present.   Patient Profile  Gary Rice is a 89 y.o. male with paroxysmal atrial fibrillation, SVT, ascending aortic aneurysm, hyperlipidemia admitted on 06/05/2024 with small bowel obstruction and A-fib with RVR.  Course complicated by GI bleed and aspiration pneumonia.   Assessment & Plan   # Atrial fibrillation with RVR # Atrial flutter - Likely in atrial flutter.  Currently here with small bowel obstruction and aspiration pneumonia.  Strict NPO. - Heart rate is fairly controlled on amiodarone  drip.  Continue this for now. - He is receiving IV digoxin  as he cannot take oral medications and has had hypotension.  He has limited options for rate control for now.  He is in need of urgent surgery. - For now continue amiodarone  drip and IV digoxin  0.125 mg daily. - Hold heparin  drip.  Has had further hemoptysis.  He needs to get through  surgery. - Unclear what we will do with him long-term but he needs to get through surgery first.  He will have to tolerate anticoagulation to be considered for TEE/cardioversion after his procedure.  We may just end up rate controlling him the best we are able.  # Hemoptysis # GI Bleed? - Bloody emesis noted.  Holding heparin  drip.  Further workup per primary team.  Unclear if he will be a good candidate for anticoagulation moving forward.  Here with small bowel obstruction that needs to be resolved.  # Aspiration pneumonia # Elevated BNP - Per primary team.  Would  benefit from some diuresis.  I will defer this to critical care medicine team. - Continue antibiotics. - Does not appear grossly volume overloaded. +529 cc this admit. BNP likely elevated related Aflutter RVR and acute illness.   # SBO - Will need surgery.  Plan for tomorrow.      For questions or updates, please contact [facility] Please consult [email or web address] for contact info under        Signed, Darryle T. Barbaraann, MD, [facility]  [facility]  06/15/2024 10:28 AM

## 2024-06-15 NOTE — Plan of Care (Signed)
     Referral previously received for [facility] for goals of care discussion. Noted most recent palliative in-person assessment dated 06/15/2024 at which time it was recommended to follow from a distance/chart check for decline.   Chart reviewed for Recent provider notes, nurse notes, vitals, and labs and updates received from RN.   At this time patient appears stable but ill.  He seems to fully understand risks and benefits of surgery, feels he has no option other than try surgery despite high risk.  Cardiology trying to rate control him as best they can.  Plan for surgery tomorrow laparoscopic versus open laparoscopic versus open for SBO not healing with conservative measures.   No plan for in person follow-up at this time.  We will continue to shadow the chart peripherally for significant decline.  Please contact the palliative medicine provider on service for any new/urgent needs that require our assistance with this patient.  Thank you for your referral and allowing PMT to assist in Gary Rice's care.   Camellia Kays, NP Palliative Medicine Team Phone: [phone]  NO CHARGE

## 2024-06-15 NOTE — Progress Notes (Addendum)
 PHARMACY - TOTAL PARENTERAL NUTRITION CONSULT NOTE  Indication: Small bowel obstruction  Patient Measurements: Height: 5' 8 (172.7 cm) Weight: 68.7 kg (151 lb 7.3 oz) IBW/kg (Calculated) : 68.4 TPN AdjBW (KG): 64.6 Body mass index is 23.03 kg/m.  Assessment:  25 YOM presented on 9/8 with constipation, found to have a SBO.  PMH significant for inguinal hernia post repair and SBR in 2022.  Patient was started on CLD on 9/11 after NGT removal and then advanced to FLD on 9/12 from which he vomited.  Patient was hypotensive and in Afib on 9/13, requiring transfer to the ICU.  NGT reinserted and diet changed back to NPO due to aspiration.  Patient has inadequate oral intake since admission and Pharmacy consulted to manage TPN for SBO.  He might require surgery and is at risk for refeeding.  Surgery plans on OR this week Thursday or Friday.   Glucose / Insulin : no hx DM - CBGs 127-138 (6 units SSI / 24 hours)  Electrolytes: low Na/Cl, K 4.2, Phos 2.5, Mag 1.8, CoCa 9.5 Renal: SCr < 1, BUN 14 Hepatic: LFTs WNL, alk phos 43, albumin  3.9 on admit now 2.1 Intake / Output; MIVF: NTG output 900, UOP 1970 (1.2 ml/kg/hr) GI Imaging: none since TPN initiation GI Surgeries / Procedures: none since TPN initiation  Central access: 06/11/24 TPN start date: 06/11/24  Nutritional Goals: Goal TPN rate is 70 mL/hr provides 85g AA, 235g CHO, 49g ILE and 1627 kCal per day  RD Estimated Needs Total Energy Estimated Needs: 1700-2000kcal/day Total Protein Estimated Needs: 85-100g/day Total Fluid Estimated Needs: 1.7-2.0L/day  Current Nutrition:  NPO, NGT LIWS   Plan:  Continue TPN @ goal rate of 47mL/hr at 1800  Electrolytes in TPN: Na 100mEq/L, K 57mEq/L, Ca 46mEq/L, Mg 10 mEq/L, Phos 25 mmol/L, Cl:Ac 2:1 Add standard MVI and trace elements to TPN Continue sensitive SSI Q4H Thiamine  100mg  IV daily x 5 doses Monitor TPN labs on Mon/Thurs - labs in AM - experiencing refeeding - monitor closely  Rankin Sams, PharmD, BCPS, BCCCP Clinical Pharmacist

## 2024-06-15 NOTE — Progress Notes (Signed)
 PROGRESS NOTE    [facility]  FMW:[id number] DOB: 1931-08-13 DOA: 06/05/2024 PCP: Kip Righter, MD   Brief Narrative:  Patient is a 89 y.o.  male with history of A-fib-presented with SBO and A-fib RVR.   SBO initially managed with conservative measures with some improvement-A-fib RVR managed with Cardizem /heparin  infusion-unfortunately-on 9/13-developed worsening gastric distention-vomited and aspirated, along with A-fib RVR and hypotension.  Gary Rice was subsequently transferred to the ICU for close monitoring.  Started on antibiotics for aspiration pneumonia, critical care, cardiology consulted and general surgery following.  Assessment & Plan:   Principal Problem:   Small bowel obstruction (HCC) Active Problems:   Paroxysmal atrial fibrillation with RVR (HCC)   Hyperlipidemia   AKI (acute kidney injury) (HCC)   Acute hypoxic respiratory failure (HCC)   Aspiration pneumonia due to gastric secretions (HCC)   Hypotension   Atrial fibrillation with RVR (HCC)  SBO Did not improve with NG tube and conservative measures.  Remains NPO with TPN.  General surgery surgery has discussed with him potential need of surgical intervention tomorrow or day after.  I personally discussed with the patient that due to his comorbidities and age, surgery is likely going to be high risk for him.  To that, Gary Rice asked  do have any other option then I sent probably no, Gary Rice said thats why I am willing to go for surgery and I understand the risks.  Per general surgery, they have scheduled him for surgical intervention tomorrow/Friday.   Transient hypotension Likely due to volume loss/vomiting, blood pressure improved without needing vasopressors.   Chronic A-fib with RVR/hemoptysis/epistaxis: Initially rate controlled with Cardizem /beta-blocker but due to worsening bowel obstruction/aspiration-has developed worsening RVR with hypotension-hence has been switched to amiodarone  infusion. IV heparin  was held due to  concern for some coffee-ground emesis however this was resumed by cardiology on 06/14/2024.  However around 2 AM on 06/15/2024, patient was noted to have hemoptysis and blood from the NG tube.  Seen by the night hospitalist.  RN was advised to use Afrin and direct pressure to stop the bleeding and continue heparin  cautiously.  However patient still is bleeding this morning, Gary Rice did not have any epistaxis during my examination, Gary Rice had mucosal bleeding from his mouth.  In order to prepare him for surgery so unnecessary bleeding is not an obstacle, we have decided to pause his heparin  until tomorrow morning.  I have informed general surgery as well.  Acute hypoxic respiratory failure secondary to aspiration pneumonia: Likely due to aspiration of vomitus, patient on Zosyn , currently on 6 L of oxygen.  Cardiology thinks diuresis may help but deferred decision to primary service on their progress note on 06/14/2024/no direct communication.  Check BNP which is elevated and chest x-ray shows worsening right pleural effusion.  Will give him 2 doses of IV Lasix  40 mg today.  Reassess tomorrow morning.  Acute blood loss anemia: Came in with normal hemoglobin, was noted to have hemoptysis as well as coffee-ground emesis which was positive for occult blood, hemoglobin has subsequently dropped to 12.2 but has not required any blood transfusion.    AKI Hemodynamically mediated Resolved.  Acute hyponatremia: Hovering around 130.  Gary Rice is asymptomatic.  Monitor.   Hypokalemia Resolved.   HLD Hold statins for now-resume when oral intake stable.  Aortic aneurysm: 4.6 cm.  Needs surveillance.  GOC: Admitted as a full code however previous hospitalist as well as palliative care had lengthy discussion with the patient and the family and subsequently Gary Rice was  transition to DNR.  DVT prophylaxis: Place and maintain sequential compression device Start: 06/14/24 0857SCD   Code Status: Limited: Do not attempt resuscitation  (DNR) -DNR-LIMITED -Do Not Intubate/DNI   Family Communication:  None present at bedside.  Plan of care discussed with patient in length and Gary Rice/she verbalized understanding and agreed with it.  Status is: Inpatient Remains inpatient appropriate because: Scheduled for surgery tomorrow  Estimated body mass index is 23.03 kg/m as calculated from the following:   Height as of this encounter: 5' 8 (1.727 m).   Weight as of this encounter: 68.7 kg.    Nutritional Assessment: Body mass index is 23.03 kg/m.SABRA Seen by dietician.  I agree with the assessment and plan as outlined below: Nutrition Status: Nutrition Problem: Inadequate oral intake Etiology: inability to eat Signs/Symptoms: NPO status Interventions: TPN  . Skin Assessment: I have examined the patient's skin and I agree with the wound assessment as performed by the wound care RN as outlined below:    Consultants:  Cardiology, critical care, general surgery  Procedures:  As above  Antimicrobials:  Anti-infectives (From admission, onward)    Start     Dose/Rate Route Frequency Ordered Stop   06/11/24 0915  piperacillin -tazobactam (ZOSYN ) IVPB 3.375 g        3.375 g 12.5 mL/hr over 240 Minutes Intravenous Every 8 hours 06/11/24 0820 06/16/24 0559   06/11/24 0800  cefTRIAXone (ROCEPHIN) 1 g in sodium chloride  0.9 % 100 mL IVPB  Status:  Discontinued        1 g 200 mL/hr over 30 Minutes Intravenous Every 24 hours 06/11/24 0642 06/11/24 0710   06/11/24 0800  cefTRIAXone (ROCEPHIN) 2 g in sodium chloride  0.9 % 100 mL IVPB  Status:  Discontinued        2 g 200 mL/hr over 30 Minutes Intravenous Every 24 hours 06/11/24 0710 06/11/24 0815         Subjective: Patient seen and examined, patient spitted A clot of blood right when I saw him.  Gary Rice had no other complaints.  Gary Rice denies any shortness of breath but Gary Rice is on 6 L of oxygen.  Objective: Vitals:   06/15/24 0400 06/15/24 0410 06/15/24 0500 06/15/24 0600  BP: (!)  117/90  117/83 111/84  Pulse: (!) 116  (!) 120 (!) 120  Resp: (!) 24  19 (!) 25  Temp:      TempSrc:      SpO2: 94%  96% 95%  Weight:  68.7 kg    Height:        Intake/Output Summary (Last 24 hours) at 06/15/2024 0745 Last data filed at 06/15/2024 0600 Gross per 24 hour  Intake 2647.6 ml  Output 2900 ml  Net -252.4 ml   Filed Weights   06/12/24 0428 06/14/24 0500 06/15/24 0410  Weight: 66.6 kg 70.2 kg 68.7 kg    Examination:  General exam: Appears calm and comfortable  Respiratory system: Minich breath sounds at the bases bilaterally. Respiratory effort normal. Cardiovascular system: S1 & S2 heard, RRR. No JVD, murmurs, rubs, gallops or clicks. No pedal edema. Gastrointestinal system: Abdomen is nondistended, soft and nontender. No organomegaly or masses felt. Normal bowel sounds heard. Central nervous system: Alert and oriented. No focal neurological deficits. Extremities: Symmetric 5 x 5 power. Skin: No rashes, lesions or ulcers.  Psychiatry: Judgement and insight appear normal. Mood & affect appropriate.    Data Reviewed: I have personally reviewed following labs and imaging studies  CBC: Recent Labs  Lab  06/11/24 0417 06/11/24 1155 06/12/24 0540 06/13/24 0736 06/14/24 0430 06/15/24 0255  WBC 17.6*  --  15.9* 13.6* 16.0* 15.9*  HGB 13.1 12.6* 12.4* 10.9* 12.2* 11.8*  HCT 38.8* 38.2* 36.3* 32.0* 36.3* 35.9*  MCV 92.6  --  93.1 93.6 93.8 97.8  PLT 188  --  156 149* 166 184   Basic Metabolic Panel: Recent Labs  Lab 06/11/24 0853 06/11/24 0854 06/12/24 0540 06/13/24 0736 06/14/24 0430 06/15/24 0450  NA  --  134* 134* 132* 130* 132*  K  --  3.5 3.4* 3.6 4.3 4.2  CL  --  96* 99 95* 100 96*  CO2  --  26 27 23 24 24   GLUCOSE  --  141* 128* 295* 147* 194*  BUN  --  24* 19 14 13 14   CREATININE  --  0.90 0.85 0.64 0.57* 0.59*  CALCIUM   --  8.7* 8.4* 7.8* 8.1* 8.0*  MG 2.2  --  1.8 1.9 1.9 1.8  PHOS 2.5  --  2.1* 2.2* 2.6 2.5   GFR: Estimated Creatinine  Clearance: 58.2 mL/min (A) (by C-G formula based on SCr of 0.59 mg/dL (L)). Liver Function Tests: Recent Labs  Lab 06/12/24 0540 06/13/24 0736 06/14/24 0430 06/15/24 0450  AST 14* 15 16 19   ALT 12 13 14 18   ALKPHOS 37* 37* 41 43  BILITOT 0.3 0.5 0.5 0.5  PROT 5.8* 5.7* 6.0* 5.8*  ALBUMIN  2.4* 2.6* 2.5* 2.1*   No results for input(s): LIPASE, AMYLASE in the last 168 hours. No results for input(s): AMMONIA in the last 168 hours. Coagulation Profile: Recent Labs  Lab 06/12/24 1230  INR 1.2   Cardiac Enzymes: No results for input(s): CKTOTAL, CKMB, CKMBINDEX, TROPONINI in the last 168 hours. BNP (last 3 results) No results for input(s): PROBNP in the last 8760 hours. HbA1C: No results for input(s): HGBA1C in the last 72 hours. CBG: Recent Labs  Lab 06/14/24 1116 06/14/24 1540 06/14/24 1926 06/14/24 2320 06/15/24 0330  GLUCAP 138* 144* 131* 127* 127*   Lipid Profile: No results for input(s): CHOL, HDL, LDLCALC, TRIG, CHOLHDL, LDLDIRECT in the last 72 hours.  Thyroid  Function Tests: No results for input(s): TSH, T4TOTAL, FREET4, T3FREE, THYROIDAB in the last 72 hours. Anemia Panel: No results for input(s): VITAMINB12, FOLATE, FERRITIN, TIBC, IRON , RETICCTPCT in the last 72 hours. Sepsis Labs: Recent Labs  Lab 06/11/24 0709 06/11/24 0854  LATICACIDVEN 1.3 1.3    Recent Results (from the past 240 hours)  MRSA Next Gen by PCR, Nasal     Status: None   Collection Time: 06/06/24 12:45 AM   Specimen: Nasal Mucosa; Nasal Swab  Result Value Ref Range Status   MRSA by PCR Next Gen NOT DETECTED NOT DETECTED Final    Comment: (NOTE) The GeneXpert MRSA Assay (FDA approved for NASAL specimens only), is one component of a comprehensive MRSA colonization surveillance program. It is not intended to diagnose MRSA infection nor to guide or monitor treatment for MRSA infections. Test performance is not FDA approved in  patients less than 56 years old. Performed at [facility], [address] [place], [place] [address]   MRSA Next Gen by PCR, Nasal     Status: None   Collection Time: 06/11/24  7:43 AM   Specimen: Nasal Mucosa; Nasal Swab  Result Value Ref Range Status   MRSA by PCR Next Gen NOT DETECTED NOT DETECTED Final    Comment: (NOTE) The GeneXpert MRSA Assay (FDA approved for NASAL  specimens only), is one component of a comprehensive MRSA colonization surveillance program. It is not intended to diagnose MRSA infection nor to guide or monitor treatment for MRSA infections. Test performance is not FDA approved in patients less than 12 years old. Performed at [facility], [address] [place], [place] [address]      Radiology Studies: DG CHEST PORT 1 VIEW Result Date: 06/13/2024 CLINICAL DATA:  [id number].  Pneumonia. EXAM: PORTABLE CHEST 1 VIEW COMPARISON:  Portable chest 06/11/2024 at 4:12 p.m. FINDINGS: 7:47 a.m. NGT extends into the stomach to the left with the tip in the proximal fundus. Right PICC again terminates at the superior cavoatrial junction. There are overlying telemetry leads. The cardiac size is normal. There is aortic tortuosity and atherosclerosis with stable mediastinum. There are small bilateral pleural effusions with overlying patchy airspace disease or atelectasis in the lung bases. The mid and upper lungs remain clear apart from linear perihilar atelectasis. Overall aeration seems unchanged.  No new osseous finding. IMPRESSION: 1. No significant change in the appearance of the chest. Small bilateral pleural effusions with overlying patchy airspace disease or atelectasis in the lung bases. 2. Aortic atherosclerosis. Electronically Signed   By: Francis Quam M.D.   On: 06/13/2024 07:59    Scheduled Meds:  Chlorhexidine  Gluconate Cloth  6 each Topical Q0600   digoxin   0.125 mg Intravenous Daily   Influenza vac split trivalent PF  0.5 mL Intramuscular Tomorrow-1000    insulin  aspart  0-9 Units Subcutaneous Q4H   ipratropium  2 spray Each Nare BID   methocarbamol   500 mg Per Tube Once   pantoprazole  (PROTONIX ) IV  40 mg Intravenous Q12H   sodium chloride  flush  10-40 mL Intracatheter Q12H   sodium chloride  flush  3 mL Intravenous Q12H   thiamine  (VITAMIN B1) injection  100 mg Intravenous Daily   Continuous Infusions:  amiodarone  60 mg/hr (06/15/24 0626)   piperacillin -tazobactam (ZOSYN )  IV 3.375 g (06/15/24 0518)   TPN ADULT (ION) 70 mL/hr at 06/15/24 0500   TPN ADULT (ION)       LOS: 10 days   Fredia Skeeter, MD [facility]  06/15/2024, 7:45 AM   *Please note that this is a verbal dictation therefore any spelling or grammatical errors are due to the Dragon Medical One system interpretation.  Please page via Amion and do not message via secure chat for urgent patient care matters. Secure chat can be used for non urgent patient care matters.  How to contact the [facility] Attending or Consulting provider 7A - 7P or covering provider during after hours 7P -7A, for this patient?  Check the care team in [facility] and look for a) attending/consulting [facility] provider listed and b) the [facility] team listed. Page or secure chat 7A-7P. Log into [email or web address] and use [place]'s universal password to access. If you do not have the password, please contact the hospital operator. Locate the [facility] provider you are looking for under [facility] and page to a number that you can be directly reached. If you still have difficulty reaching the provider, please page the [facility] (Director on Call) for the Hospitalists listed on amion for assistance.

## 2024-06-15 NOTE — Anesthesia Preprocedure Evaluation (Signed)
 Anesthesia Evaluation  Patient identified by MRN, date of birth, ID band Patient awake    Reviewed: Allergy & Precautions, H&P , NPO status , Patient's Chart, lab work & pertinent test results  Airway Mallampati: II  TM Distance: >3 FB Neck ROM: Full    Dental no notable dental hx.    Pulmonary former smoker Aspiration pna   Pulmonary exam normal breath sounds clear to auscultation       Cardiovascular negative cardio ROS Normal cardiovascular exam+ dysrhythmias Atrial Fibrillation  Rhythm:Regular Rate:Normal  Aortic aneurysm  IMPRESSIONS     1. Technically difficult study with limited views. Left ventricular  ejection fraction, by estimation, is 50 to 55%. The left ventricle has low  normal function. Left ventricular endocardial border not optimally defined  to evaluate regional wall motion.  There is mild left ventricular hypertrophy. Left ventricular diastolic  parameters are indeterminate.   2. Right ventricular systolic function was not well visualized. The right  ventricular size is not well visualized.   3. The mitral valve is degenerative. Trivial mitral valve regurgitation.   4. The aortic valve was not well visualized. Aortic valve regurgitation  is trivial. No aortic stenosis is present.      Neuro/Psych neg Seizures negative neurological ROS  negative psych ROS   GI/Hepatic negative GI ROS, Neg liver ROS,,,BOWEL OBSTRUCTION, TPN   Endo/Other  negative endocrine ROS    Renal/GU Renal diseasenegative Renal ROS  negative genitourinary   Musculoskeletal negative musculoskeletal ROS (+)    Abdominal   Peds negative pediatric ROS (+)  Hematology negative hematology ROS (+) Blood dyscrasia, anemia   Anesthesia Other Findings   Reproductive/Obstetrics negative OB ROS                              Anesthesia Physical Anesthesia Plan  ASA: 3  Anesthesia Plan: General    Post-op Pain Management: Ofirmev  IV (intra-op)*   Induction: Intravenous  PONV Risk Score and Plan: 2 and Ondansetron , Dexamethasone  and Treatment may vary due to age or medical condition  Airway Management Planned: Oral ETT  Additional Equipment: Arterial line  Intra-op Plan:   Post-operative Plan: Extubation in OR  Informed Consent: I have reviewed the patients History and Physical, chart, labs and discussed the procedure including the risks, benefits and alternatives for the proposed anesthesia with the patient or authorized representative who has indicated his/her understanding and acceptance.   Patient has DNR.   Dental advisory given  Plan Discussed with: CRNA  Anesthesia Plan Comments:          Anesthesia Quick Evaluation

## 2024-06-15 NOTE — Progress Notes (Signed)
 Physical Therapy Treatment Patient Details Name: Gary Rice MRN: [id number] DOB: Sep 29, 1930 Today's Date: 06/15/2024   History of Present Illness Pt is a 89 y.o. M who presents 06/05/2024 with SBO. Significant PMH:  A-fib on Cardizem  and Eliquis , history of SVT, history of inguinal hernia, history of bowel resection in 2022, inguinal hernia repair 2022.    PT Comments  Patient with limited progress.  Continued concern about coughing though Heparin  stopped and RN approved mobility.  He agreed to in bed therex and was able to scoot up in bed with min A.  Noted potential for surgery tomorrow.  Will defer updated disposition to post procedure.  PT will follow up.    If plan is discharge home, recommend the following: Assistance with cooking/housework;Assist for transportation;Help with stairs or ramp for entrance;A little help with walking and/or transfers   Can travel by private vehicle        Equipment Recommendations  Rollator (4 wheels);Other (comment) (shower chair)    Recommendations for Other Services       Precautions / Restrictions Precautions Precautions: Fall Recall of Precautions/Restrictions: Intact Precaution/Restrictions Comments: watch HR & SpO2, NG tube to LIWS, [place] O2, TPN, coughs up blood     Mobility  Bed Mobility Overal bed mobility: Needs Assistance             General bed mobility comments: scooting up in bed with min to mod A pt using rails and cues for LE positioning. Declined EOB today    Transfers                        Ambulation/Gait                   Stairs             Wheelchair Mobility     Tilt Bed    Modified Rankin (Stroke Patients Only)       Balance                                            Communication Communication Communication: No apparent difficulties  Cognition Arousal: Alert Behavior During Therapy: WFL for tasks assessed/performed   PT - Cognitive impairments:  No apparent impairments                         Following commands: Intact      Cueing Cueing Techniques: Verbal cues  Exercises General Exercises - Lower Extremity Ankle Circles/Pumps: AROM, 20 reps, Supine Hip ABduction/ADduction: Strengthening, 10 reps (hooklying hip adductor squeezes w/ 5 sec hold, then hip clamshell abduction with yellow t-band) Straight Leg Raises: Strengthening, Both, 10 reps, Supine Low Level/ICU Exercises Stabilized Bridging: Strengthening, Both, 10 reps, Supine Other Exercises Other Exercises: low trunk rotation x 10    General Comments General comments (skin integrity, edema, etc.): on O2 via  at 6 LPM; HR mid 120's throughout, SpO2 87% upon entry with O2 prongs out of his nose, replaced and back to 90-92%; BP stable      Pertinent Vitals/Pain Pain Assessment Pain Assessment: Faces Faces Pain Scale: Hurts a little bit Pain Location: knee with exercise Pain Descriptors / Indicators: Discomfort Pain Intervention(s): Monitored during session, Repositioned    Home Living  Prior Function            PT Goals (current goals can now be found in the care plan section) Acute Rehab PT Goals Patient Stated Goal: get better PT Goal Formulation: With patient Time For Goal Achievement: 06/20/24 Potential to Achieve Goals: Good Progress towards PT goals: PT to reassess next treatment    Frequency    Min 2X/week      PT Plan      Co-evaluation              AM-PAC PT 6 Clicks Mobility   Outcome Measure  Help needed turning from your back to your side while in a flat bed without using bedrails?: A Little Help needed moving from lying on your back to sitting on the side of a flat bed without using bedrails?: Total Help needed moving to and from a bed to a chair (including a wheelchair)?: Total Help needed standing up from a chair using your arms (e.g., wheelchair or bedside chair)?: Total Help  needed to walk in hospital room?: Total Help needed climbing 3-5 steps with a railing? : Total 6 Click Score: 8    End of Session   Activity Tolerance: Patient tolerated treatment well Patient left: in bed;with call bell/phone within reach   PT Visit Diagnosis: Unsteadiness on feet (R26.81);Other abnormalities of gait and mobility (R26.89);Muscle weakness (generalized) (M62.81);Difficulty in walking, not elsewhere classified (R26.2)     Time: 8643-8581 PT Time Calculation (min) (ACUTE ONLY): 22 min  Charges:    $Therapeutic Exercise: 8-22 mins PT General Charges $$ ACUTE PT VISIT: 1 Visit                     Micheline Portal, PT Acute Rehabilitation Services Office:[phone] 06/15/2024    Montie Portal 06/15/2024, 6:00 PM

## 2024-06-15 NOTE — Progress Notes (Signed)
 Patient seen for coughing up bright red blood.   He is noted to have epistaxis from the right nare. Plan to use Afrin and direct pressure, cautiously continue IV heparin  for now.

## 2024-06-15 NOTE — Progress Notes (Signed)
 RN notified MD of continued bright red secretions from oral cavity. MD also made aware of NGT drainage being bright red.   0500 ordered labs collected early.   Per MD continue heparin  drip and low intermittent ngt suctioning.

## 2024-06-15 NOTE — Progress Notes (Signed)
 Attending paged after pt becoming hypotensive. Pt remains neuro intact and responding to orientation questions

## 2024-06-16 ENCOUNTER — Other Ambulatory Visit: Payer: Self-pay

## 2024-06-16 ENCOUNTER — Encounter (HOSPITAL_COMMUNITY): Payer: Self-pay | Admitting: Internal Medicine

## 2024-06-16 ENCOUNTER — Encounter (HOSPITAL_COMMUNITY): Payer: Self-pay | Admitting: Anesthesiology

## 2024-06-16 ENCOUNTER — Inpatient Hospital Stay (HOSPITAL_COMMUNITY): Payer: Self-pay | Admitting: Anesthesiology

## 2024-06-16 ENCOUNTER — Encounter (HOSPITAL_COMMUNITY)
Admission: EM | Disposition: A | Discharge status: Skilled Nursing Facility | Payer: Self-pay | Source: Home / Self Care | Attending: Internal Medicine

## 2024-06-16 DIAGNOSIS — J9601 Acute respiratory failure with hypoxia: Secondary | ICD-10-CM | POA: Diagnosis not present

## 2024-06-16 DIAGNOSIS — I4891 Unspecified atrial fibrillation: Secondary | ICD-10-CM | POA: Diagnosis not present

## 2024-06-16 DIAGNOSIS — I4892 Unspecified atrial flutter: Secondary | ICD-10-CM

## 2024-06-16 DIAGNOSIS — K56609 Unspecified intestinal obstruction, unspecified as to partial versus complete obstruction: Secondary | ICD-10-CM | POA: Diagnosis not present

## 2024-06-16 DIAGNOSIS — Z87891 Personal history of nicotine dependence: Secondary | ICD-10-CM

## 2024-06-16 DIAGNOSIS — I480 Paroxysmal atrial fibrillation: Secondary | ICD-10-CM | POA: Diagnosis not present

## 2024-06-16 HISTORY — PX: LAPAROSCOPY: SHX197

## 2024-06-16 HISTORY — PX: LAPAROSCOPIC LYSIS OF ADHESIONS: SHX5905

## 2024-06-16 HISTORY — PX: SMALL BOWEL REPAIR: SHX6447

## 2024-06-16 LAB — BASIC METABOLIC PANEL WITH GFR
Anion gap: 7 (ref 5–15)
BUN: 25 mg/dL — ABNORMAL HIGH (ref 8–23)
CO2: 25 mmol/L (ref 22–32)
Calcium: 8.2 mg/dL — ABNORMAL LOW (ref 8.9–10.3)
Chloride: 102 mmol/L (ref 98–111)
Creatinine, Ser: 0.86 mg/dL (ref 0.61–1.24)
GFR, Estimated: >60 mL/min (ref >60)
Glucose, Bld: 129 mg/dL — ABNORMAL HIGH (ref 70–99)
Potassium: 4.1 mmol/L (ref 3.5–5.1)
Sodium: 134 mmol/L — ABNORMAL LOW (ref 135–145)

## 2024-06-16 LAB — CBC WITH DIFFERENTIAL/PLATELET
Abs Immature Granulocytes: 0.23 K/uL — ABNORMAL HIGH (ref 0.00–0.07)
Basophils Absolute: 0.1 K/uL (ref 0.0–0.1)
Basophils Relative: 1 %
Eosinophils Absolute: 0.2 K/uL (ref 0.0–0.5)
Eosinophils Relative: 1 %
HCT: 28.6 % — ABNORMAL LOW (ref 39.0–52.0)
Hemoglobin: 10 g/dL — ABNORMAL LOW (ref 13.0–17.0)
Immature Granulocytes: 2 %
Lymphocytes Relative: 8 %
Lymphs Abs: 1.2 K/uL (ref 0.7–4.0)
MCH: 33.1 pg (ref 26.0–34.0)
MCHC: 35 g/dL (ref 30.0–36.0)
MCV: 94.7 fL (ref 80.0–100.0)
Monocytes Absolute: 1.5 K/uL — ABNORMAL HIGH (ref 0.1–1.0)
Monocytes Relative: 10 %
Neutro Abs: 12 K/uL — ABNORMAL HIGH (ref 1.7–7.7)
Neutrophils Relative %: 78 %
Platelets: 163 K/uL (ref 150–400)
RBC: 3.02 MIL/uL — ABNORMAL LOW (ref 4.22–5.81)
RDW: 13.3 % (ref 11.5–15.5)
WBC: 15.2 K/uL — ABNORMAL HIGH (ref 4.0–10.5)
nRBC: 0 % (ref 0.0–0.2)

## 2024-06-16 LAB — GLUCOSE, CAPILLARY
Glucose-Capillary: 118 mg/dL — ABNORMAL HIGH (ref 70–99)
Glucose-Capillary: 117 mg/dL — ABNORMAL HIGH (ref 70–99)
Glucose-Capillary: 131 mg/dL — ABNORMAL HIGH (ref 70–99)
Glucose-Capillary: 132 mg/dL — ABNORMAL HIGH (ref 70–99)
Glucose-Capillary: 120 mg/dL — ABNORMAL HIGH (ref 70–99)
Glucose-Capillary: 131 mg/dL — ABNORMAL HIGH (ref 70–99)

## 2024-06-16 LAB — PREPARE RBC (CROSSMATCH)

## 2024-06-16 SURGERY — LAPAROSCOPY, DIAGNOSTIC
Anesthesia: General | Site: Abdomen

## 2024-06-16 MED ORDER — SUGAMMADEX SODIUM 200 MG/2ML IV SOLN
INTRAVENOUS | Status: DC | PRN
  Administered 2024-06-16: 252.4 mg via INTRAVENOUS

## 2024-06-16 MED ORDER — SUCCINYLCHOLINE CHLORIDE 200 MG/10ML IV SOSY
PREFILLED_SYRINGE | INTRAVENOUS | Status: AC
  Filled 2024-06-16: qty 10

## 2024-06-16 MED ORDER — OXYCODONE HCL 5 MG PO TABS: 5.0000 mg | ORAL_TABLET | Freq: Once | ORAL | Status: DC | PRN

## 2024-06-16 MED ORDER — PROPOFOL 10 MG/ML IV BOLUS
INTRAVENOUS | Status: DC | PRN
  Administered 2024-06-16: 100 mg via INTRAVENOUS

## 2024-06-16 MED ORDER — SUCCINYLCHOLINE CHLORIDE 200 MG/10ML IV SOSY
PREFILLED_SYRINGE | INTRAVENOUS | Status: DC | PRN
Start: 2024-06-16 — End: 2024-06-16
  Administered 2024-06-16: 100 mg via INTRAVENOUS

## 2024-06-16 MED ORDER — ACETAMINOPHEN 10 MG/ML IV SOLN
INTRAVENOUS | Status: AC
  Filled 2024-06-16: qty 100

## 2024-06-16 MED ORDER — INSULIN ASPART 100 UNIT/ML IJ SOLN
0.0000 [IU] | Freq: Four times a day (QID) | INTRAMUSCULAR | Status: DC
  Administered 2024-06-16 – 2024-06-17 (×5): 1 [IU] via SUBCUTANEOUS

## 2024-06-16 MED ORDER — ALBUMIN HUMAN 5 % IV SOLN: INTRAVENOUS | Status: DC | PRN

## 2024-06-16 MED ORDER — ONDANSETRON HCL 4 MG/2ML IJ SOLN
INTRAMUSCULAR | Status: DC | PRN
  Administered 2024-06-16: 4 mg via INTRAVENOUS

## 2024-06-16 MED ORDER — TRAVASOL 10 % IV SOLN
INTRAVENOUS | Status: AC
  Filled 2024-06-16: qty 848.4

## 2024-06-16 MED ORDER — ROCURONIUM BROMIDE 10 MG/ML (PF) SYRINGE
PREFILLED_SYRINGE | INTRAVENOUS | Status: AC
Start: 2024-06-16 — End: 2024-06-16
  Filled 2024-06-16: qty 10

## 2024-06-16 MED ORDER — DROPERIDOL 2.5 MG/ML IJ SOLN: 0.6250 mg | Freq: Once | INTRAMUSCULAR | Status: DC | PRN

## 2024-06-16 MED ORDER — LIDOCAINE 2% (20 MG/ML) 5 ML SYRINGE
INTRAMUSCULAR | Status: DC | PRN
Start: 2024-06-16 — End: 2024-06-16
  Administered 2024-06-16: 60 mg via INTRAVENOUS

## 2024-06-16 MED ORDER — ORAL CARE MOUTH RINSE: 15.0000 mL | Freq: Once | OROMUCOSAL | Status: AC

## 2024-06-16 MED ORDER — CHLORHEXIDINE GLUCONATE 0.12 % MT SOLN: 15.0000 mL | Freq: Once | OROMUCOSAL | Status: AC

## 2024-06-16 MED ORDER — FENTANYL CITRATE (PF) 250 MCG/5ML IJ SOLN
INTRAMUSCULAR | Status: DC | PRN
  Administered 2024-06-16: 25 ug via INTRAVENOUS
  Administered 2024-06-16: 50 ug via INTRAVENOUS

## 2024-06-16 MED ORDER — ROCURONIUM BROMIDE 10 MG/ML (PF) SYRINGE
PREFILLED_SYRINGE | INTRAVENOUS | Status: DC | PRN
  Administered 2024-06-16: 50 mg via INTRAVENOUS

## 2024-06-16 MED ORDER — PROPOFOL 10 MG/ML IV BOLUS
INTRAVENOUS | Status: AC
  Filled 2024-06-16: qty 20

## 2024-06-16 MED ORDER — FENTANYL CITRATE (PF) 100 MCG/2ML IJ SOLN: 25.0000 ug | INTRAMUSCULAR | Status: DC | PRN

## 2024-06-16 MED ORDER — FENTANYL CITRATE (PF) 250 MCG/5ML IJ SOLN
INTRAMUSCULAR | Status: AC
  Filled 2024-06-16: qty 5

## 2024-06-16 MED ORDER — OXYCODONE HCL 5 MG/5ML PO SOLN: 5.0000 mg | Freq: Once | ORAL | Status: DC | PRN

## 2024-06-16 MED ORDER — PHENYLEPHRINE 80 MCG/ML (10ML) SYRINGE FOR IV PUSH (FOR BLOOD PRESSURE SUPPORT)
PREFILLED_SYRINGE | INTRAVENOUS | Status: AC
  Filled 2024-06-16: qty 10

## 2024-06-16 MED ORDER — LACTATED RINGERS IV SOLN: INTRAVENOUS | Status: DC

## 2024-06-16 MED ORDER — PHENYLEPHRINE HCL-NACL 20-0.9 MG/250ML-% IV SOLN
INTRAVENOUS | Status: DC | PRN
  Administered 2024-06-16: 20 ug/min via INTRAVENOUS

## 2024-06-16 MED ORDER — BUPIVACAINE HCL (PF) 0.25 % IJ SOLN
INTRAMUSCULAR | Status: AC
  Filled 2024-06-16: qty 30

## 2024-06-16 MED ORDER — 0.9 % SODIUM CHLORIDE (POUR BTL) OPTIME
TOPICAL | Status: DC | PRN
  Administered 2024-06-16: 1000 mL

## 2024-06-16 MED ORDER — SODIUM CHLORIDE 0.9 % IV SOLN: INTRAVENOUS | Status: DC | PRN

## 2024-06-16 MED ORDER — LIDOCAINE 2% (20 MG/ML) 5 ML SYRINGE
INTRAMUSCULAR | Status: AC
Start: 2024-06-16 — End: 2024-06-16
  Filled 2024-06-16: qty 5

## 2024-06-16 MED ORDER — CHLORHEXIDINE GLUCONATE 0.12 % MT SOLN
OROMUCOSAL | Status: AC
  Administered 2024-06-16: 15 mL via OROMUCOSAL
  Filled 2024-06-16: qty 15

## 2024-06-16 MED ORDER — ONDANSETRON HCL 4 MG/2ML IJ SOLN
INTRAMUSCULAR | Status: AC
  Filled 2024-06-16: qty 2

## 2024-06-16 MED ORDER — ACETAMINOPHEN 10 MG/ML IV SOLN: 1000.0000 mg | Freq: Once | INTRAVENOUS | Status: DC | PRN

## 2024-06-16 MED ORDER — PHENYLEPHRINE 80 MCG/ML (10ML) SYRINGE FOR IV PUSH (FOR BLOOD PRESSURE SUPPORT)
PREFILLED_SYRINGE | INTRAVENOUS | Status: DC | PRN
Start: 2024-06-16 — End: 2024-06-16
  Administered 2024-06-16: 160 ug via INTRAVENOUS
  Administered 2024-06-16: 240 ug via INTRAVENOUS
  Administered 2024-06-16 (×8): 160 ug via INTRAVENOUS

## 2024-06-16 MED ORDER — ACETAMINOPHEN 10 MG/ML IV SOLN
INTRAVENOUS | Status: DC | PRN
  Administered 2024-06-16: 1000 mg via INTRAVENOUS

## 2024-06-16 SURGICAL SUPPLY — 53 items
BAG COUNTER SPONGE SURGICOUNT (BAG) ×3 IMPLANT
BLADE CLIPPER SURG (BLADE) IMPLANT
CANISTER SUCTION 3000ML PPV (SUCTIONS) ×3 IMPLANT
CHLORAPREP W/TINT 26 (MISCELLANEOUS) ×3 IMPLANT
COVER SURGICAL LIGHT HANDLE (MISCELLANEOUS) ×3 IMPLANT
DERMABOND ADVANCED .7 DNX12 (GAUZE/BANDAGES/DRESSINGS) ×3 IMPLANT
DRAPE LAPAROSCOPIC ABDOMINAL (DRAPES) ×3 IMPLANT
DRAPE WARM FLUID 44X44 (DRAPES) ×3 IMPLANT
DRSG OPSITE POSTOP 4X10 (GAUZE/BANDAGES/DRESSINGS) IMPLANT
DRSG OPSITE POSTOP 4X8 (GAUZE/BANDAGES/DRESSINGS) IMPLANT
ELECT BLADE 6.5 EXT (BLADE) IMPLANT
ELECT CAUTERY BLADE 6.4 (BLADE) ×3 IMPLANT
ELECTRODE REM PT RTRN 9FT ADLT (ELECTROSURGICAL) ×3 IMPLANT
GLOVE BIO SURGEON STRL SZ7.5 (GLOVE) ×3 IMPLANT
GLOVE BIOGEL PI IND STRL 8 (GLOVE) ×3 IMPLANT
GOWN STRL REUS W/ TWL LRG LVL3 (GOWN DISPOSABLE) ×6 IMPLANT
GOWN STRL REUS W/ TWL XL LVL3 (GOWN DISPOSABLE) ×3 IMPLANT
HANDLE SUCTION POOLE (INSTRUMENTS) ×3 IMPLANT
IRRIGATION SUCT STRKRFLW 2 WTP (MISCELLANEOUS) IMPLANT
KIT BASIN OR (CUSTOM PROCEDURE TRAY) ×3 IMPLANT
KIT TURNOVER KIT B (KITS) ×3 IMPLANT
LIGASURE IMPACT 36 18CM CVD LR (INSTRUMENTS) IMPLANT
NDL 22X1.5 STRL (OR ONLY) (MISCELLANEOUS) ×3 IMPLANT
NDL INSUFFLATION 14GA 120MM (NEEDLE) ×3 IMPLANT
NEEDLE 22X1.5 STRL (OR ONLY) (MISCELLANEOUS) ×3 IMPLANT
NEEDLE INSUFFLATION 14GA 120MM (NEEDLE) ×3 IMPLANT
NS IRRIG 1000ML POUR BTL (IV SOLUTION) ×6 IMPLANT
PACK GENERAL/GYN (CUSTOM PROCEDURE TRAY) ×3 IMPLANT
PAD ARMBOARD POSITIONER FOAM (MISCELLANEOUS) ×6 IMPLANT
PENCIL SMOKE EVACUATOR (MISCELLANEOUS) ×3 IMPLANT
SCISSORS LAP 5X35 DISP (ENDOMECHANICALS) IMPLANT
SET TUBE SMOKE EVAC HIGH FLOW (TUBING) ×3 IMPLANT
SLEEVE Z-THREAD 5X100MM (TROCAR) ×3 IMPLANT
SPECIMEN JAR LARGE (MISCELLANEOUS) IMPLANT
SPONGE T-LAP 18X18 ~~LOC~~+RFID (SPONGE) IMPLANT
STAPLER SKIN PROX 35W (STAPLE) ×3 IMPLANT
SUT MNCRL AB 4-0 PS2 18 (SUTURE) ×3 IMPLANT
SUT PDS AB 1 TP1 54 (SUTURE) ×6 IMPLANT
SUT SILK 2 0 SH CR/8 (SUTURE) ×3 IMPLANT
SUT SILK 2 0 TIES 10X30 (SUTURE) ×3 IMPLANT
SUT SILK 3 0 SH CR/8 (SUTURE) ×3 IMPLANT
SUT SILK 3 0 TIES 10X30 (SUTURE) ×3 IMPLANT
SUT VIC AB 2-0 SH 27X BRD (SUTURE) IMPLANT
TOWEL GREEN STERILE (TOWEL DISPOSABLE) ×3 IMPLANT
TOWEL GREEN STERILE FF (TOWEL DISPOSABLE) ×3 IMPLANT
TRAY FOLEY MTR SLVR 16FR STAT (SET/KITS/TRAYS/PACK) ×3 IMPLANT
TRAY LAPAROSCOPIC MC (CUSTOM PROCEDURE TRAY) ×3 IMPLANT
TROCAR 11X100 Z THREAD (TROCAR) IMPLANT
TROCAR BALLN 12MMX100 BLUNT (TROCAR) IMPLANT
TROCAR XCEL NON-BLD 5MMX100MML (ENDOMECHANICALS) ×3 IMPLANT
TROCAR Z THREAD OPTICAL 12X100 (TROCAR) IMPLANT
WARMER LAPAROSCOPE (MISCELLANEOUS) ×3 IMPLANT
YANKAUER SUCT BULB TIP NO VENT (SUCTIONS) IMPLANT

## 2024-06-16 NOTE — Progress Notes (Signed)
 PROGRESS NOTE  Job[facility]  FMW:[id number] DOB: 12-29-1930 DOA: 06/05/2024 PCP: Kip Righter, MD  Consultants  Brief Narrative: 89 y.o. male with history of A-fib-presented with SBO and A-fib RVR.   SBO initially managed with conservative measures with some improvement-A-fib RVR managed with Cardizem /heparin  infusion-unfortunately-on 9/13-developed worsening gastric distention-vomited and aspirated, along with A-fib RVR and hypotension.  He was subsequently transferred to the ICU for close monitoring.  Started on antibiotics for aspiration pneumonia, critical care, cardiology consulted and general surgery following.   Assessment & Plan:  SBO - s/p surgery earlier today.  - recovering.  Feels pain is adequately controlled. -NG tube still in place.  Remains NPO.  On TPN.   Chronic A-fib with RVR/hemoptysis/epistaxis: Initially rate controlled with Cardizem /beta-blocker but due to worsening bowel obstruction/aspiration - Remains on amiodarone  drip.  Cardiology following.  Greatly appreciate input. - Also on IV digoxin  0.125 mg daily. - Anticoagulation being held secondary to hemoptysis EVM 9/18 in fact he just had surgery. - Will eventually need AC restarted, hold for now. -Likely able to restart over the weekend pending surgery input. -Trend hemoglobin   Acute hypoxic respiratory failure secondary to aspiration pneumonia: Likely due to aspiration of vomitus, patient on Zosyn , currently on 6 L of oxygen.   - No longer.  Fluid overloaded. - Hold on diuresis and continue antibiotics for now. - Did have mild cough on my exam, seen after surgery.   Acute blood loss anemia:  - Came in with normal hemoglobin, was noted to have hemoptysis as well as coffee-ground emesis which was positive for occult blood - Slow drop in hemoglobin. - No need for a blood transfusion at this time.  Will trend hemoglobin.  Holding AC as above.     AKI Hemodynamically mediated Resolved.   Acute  hyponatremia:  - Hovering around 130.  He is asymptomatic.  Monitor.   Hypokalemia Resolved.   HLD Hold statins for now-resume when oral intake stable.   Aortic aneurysm: 4.6 cm.  Needs surveillance.   GOC: Admitted as a full code however previous hospitalist as well as palliative care had lengthy discussion with the patient and the family and subsequently he was transition to DNR.  Nutrition Problem: Inadequate oral intake Etiology: inability to eat Signs/Symptoms: NPO status Interventions: TPN Wound 06/11/24 0740 Pressure Injury Buttocks Medial Deep Tissue Pressure Injury - Purple or maroon localized area of discolored intact skin or blood-filled blister due to damage of underlying soft tissue from pressure and/or shear. (Active)   DVT prophylaxis:  Place and maintain sequential compression device Start: 06/14/24 0857  Code Status:   Code Status: Limited: Do not attempt resuscitation (DNR) -DNR-LIMITED -Do Not Intubate/DNI  Level of care: ICU Status is: Inpatient  Consults called: General Surgery, cardiology, palliative care.  Subjective: Patient seen after surgery.  Awake.  Denies abdominal pain unless he is coughing.  Objective: Vitals:   06/16/24 1200 06/16/24 1300 06/16/24 1400 06/16/24 1527  BP: (!) 87/59 102/73 101/71   Pulse: 94 92 86   Resp: (!) 24 (!) 22 (!) 39   Temp:    97.6 F (36.4 C)  TempSrc:    Oral  SpO2: 95% 100% 100%   Weight:      Height:        Intake/Output Summary (Last 24 hours) at 06/16/2024 1549 Last data filed at 06/16/2024 1400 Gross per 24 hour  Intake 3389.04 ml  Output 3140 ml  Net 249.04 ml   Filed Weights   06/14/24  0500 06/15/24 0410 06/16/24 0437  Weight: 70.2 kg 68.7 kg 63.1 kg   Body mass index is 21.15 kg/m (pended).  Gen: 89 y.o. male in no apparent distress.  Nontoxic, NGT in place.   Pulm: Non-labored breathing.  Clear to auscultation anterior chest CV: Irregular rhythm GI: Abdomen soft, bandages in place, clean  dry intact. Ext: Warm, no deformities Skin: No rashes, lesions ulcers Neuro: Alert and oriented. No focal neurological deficits. Psych: Calm  Judgement and insight appear normal. Mood & affect appropriate.    I have personally reviewed the following labs and images: CBC: Recent Labs  Lab 06/13/24 0736 06/14/24 0430 06/15/24 0255 06/15/24 1639 06/16/24 0435  WBC 13.6* 16.0* 15.9* 18.0* 15.2*  NEUTROABS  --   --   --  14.9* 12.0*  HGB 10.9* 12.2* 11.8* 11.7* 10.0*  HCT 32.0* 36.3* 35.9* 34.4* 28.6*  MCV 93.6 93.8 97.8 93.2 94.7  PLT 149* 166 184 203 163   BMP &GFR Recent Labs  Lab 06/11/24 0853 06/11/24 0854 06/12/24 0540 06/13/24 0736 06/14/24 0430 06/15/24 0450 06/16/24 0530  NA  --    < > 134* 132* 130* 132* 134*  K  --    < > 3.4* 3.6 4.3 4.2 4.1  CL  --    < > 99 95* 100 96* 102  CO2  --    < > 27 23 24 24 25   GLUCOSE  --    < > 128* 295* 147* 194* 129*  BUN  --    < > 19 14 13 14  25*  CREATININE  --    < > 0.85 0.64 0.57* 0.59* 0.86  CALCIUM   --    < > 8.4* 7.8* 8.1* 8.0* 8.2*  MG 2.2  --  1.8 1.9 1.9 1.8  --   PHOS 2.5  --  2.1* 2.2* 2.6 2.5  --    < > = values in this interval not displayed.   Estimated Creatinine Clearance: 49.9 mL/min (by C-G formula based on SCr of 0.86 mg/dL). Liver & Pancreas: Recent Labs  Lab 06/12/24 0540 06/13/24 0736 06/14/24 0430 06/15/24 0450  AST 14* 15 16 19   ALT 12 13 14 18   ALKPHOS 37* 37* 41 43  BILITOT 0.3 0.5 0.5 0.5  PROT 5.8* 5.7* 6.0* 5.8*  ALBUMIN  2.4* 2.6* 2.5* 2.1*   No results for input(s): LIPASE, AMYLASE in the last 168 hours. No results for input(s): AMMONIA in the last 168 hours. Diabetic: No results for input(s): HGBA1C in the last 72 hours. Recent Labs  Lab 06/15/24 2338 06/16/24 0329 06/16/24 0737 06/16/24 1125 06/16/24 1525  GLUCAP 123* 118* 117* 131* 132*   Cardiac Enzymes: No results for input(s): CKTOTAL, CKMB, CKMBINDEX, TROPONINI in the last 168 hours. No results for  input(s): PROBNP in the last 8760 hours. Coagulation Profile: Recent Labs  Lab 06/12/24 1230  INR 1.2   Thyroid  Function Tests: No results for input(s): TSH, T4TOTAL, FREET4, T3FREE, THYROIDAB in the last 72 hours. Lipid Profile: No results for input(s): CHOL, HDL, LDLCALC, TRIG, CHOLHDL, LDLDIRECT in the last 72 hours. Anemia Panel: No results for input(s): VITAMINB12, FOLATE, FERRITIN, TIBC, IRON , RETICCTPCT in the last 72 hours. Urine analysis:    Component Value Date/Time   COLORURINE YELLOW 02/23/2022 1703   APPEARANCEUR CLEAR 02/23/2022 1703   LABSPEC 1.015 02/23/2022 1703   PHURINE 6.0 02/23/2022 1703   GLUCOSEU NEGATIVE 02/23/2022 1703   HGBUR NEGATIVE 02/23/2022 1703   BILIRUBINUR NEGATIVE 02/23/2022 1703  KETONESUR 5 (A) 02/23/2022 1703   PROTEINUR NEGATIVE 02/23/2022 1703   NITRITE NEGATIVE 02/23/2022 1703   LEUKOCYTESUR NEGATIVE 02/23/2022 1703   Sepsis Labs: Invalid input(s): PROCALCITONIN, LACTICIDVEN  Microbiology: Recent Results (from the past 240 hours)  MRSA Next Gen by PCR, Nasal     Status: None   Collection Time: 06/11/24  7:43 AM   Specimen: Nasal Mucosa; Nasal Swab  Result Value Ref Range Status   MRSA by PCR Next Gen NOT DETECTED NOT DETECTED Final    Comment: (NOTE) The GeneXpert MRSA Assay (FDA approved for NASAL specimens only), is one component of a comprehensive MRSA colonization surveillance program. It is not intended to diagnose MRSA infection nor to guide or monitor treatment for MRSA infections. Test performance is not FDA approved in patients less than 8 years old. Performed at [facility], [address], [place], [place] [address]     Radiology Studies: No results found.  Scheduled Meds:  Chlorhexidine  Gluconate Cloth  6 each Topical Q0600   digoxin   0.125 mg Intravenous Daily   Influenza vac split trivalent PF  0.5 mL Intramuscular Tomorrow-1000   insulin  aspart  0-9 Units  Subcutaneous Q6H   ipratropium  2 spray Each Nare BID   methocarbamol   500 mg Per Tube Once   pantoprazole  (PROTONIX ) IV  40 mg Intravenous Q12H   sodium chloride  flush  10-40 mL Intracatheter Q12H   sodium chloride  flush  3 mL Intravenous Q12H   Continuous Infusions:  amiodarone  60 mg/hr (06/16/24 1328)   TPN ADULT (ION) 70 mL/hr at 06/16/24 0600   TPN ADULT (ION)       LOS: 11 days   35 minutes with more than 50% spent in reviewing records, counseling patient/family and coordinating care.  Reyes VEAR Gaw, MD [facility] [email or web address] 06/16/2024, 3:49 PM

## 2024-06-16 NOTE — Plan of Care (Signed)

## 2024-06-16 NOTE — Op Note (Signed)
 Patient: Gary Rice (01/04/1931, [id number])  Date of Surgery: 06/16/2024  Preoperative Diagnosis: BOWEL OBSTRUCTION   Postoperative Diagnosis: BOWEL OBSTRUCTION   Surgical Procedure:  Diagnostic laparoscopy Laparoscopic lysis of adhesions for 45 minutes  Operative Team Members:  Surgeons and Role:    * Ciearra Rufo, Deward PARAS, MD - Primary   Anesthesiologist: Erma Thom SAUNDERS, MD CRNA: Harrold Macintosh, CRNA; Delores Dus, CRNA   Anesthesia: General   Fluids:  Total I/O In: 1100 [I.V.:500; IV Piggyback:600] Out: 330 [Urine:300; Blood:30]  Complications: None  Drains:  none   Specimen: * No specimens in log *   Disposition:  PACU - hemodynamically stable.  Plan of Care: Continue inpatient care    Indications for Procedure: Gary Rice is a 89 y.o. male who presented with a bowel obstruction.  He has a ventral hernia and a inguinal hernia though these did not appear to be the cause of bowel obstruction.  He was admitted to the medical service and a nasogastric tube was placed.  He responded well to nasogastric decompression and eventually had a bowel movement and the NG tube was removed.  Unfortunately the bowel obstruction quickly recurred and he vomited and suffered an aspiration event.  He slowly been improving on the floor.  We felt surgery was his only option for definitive treatment of this bowel obstruction versus comfort care with prolonged nasogastric tube decompression.  We discussed the high risks involved with surgery.  He understood the risks including death and wanted to pursue surgery.  I recommended a laparoscopic, possibly open abdominal exploration with possible bowel resection.  We discussed the surgery itself as well as its risk, benefits, and alternatives.  The risk discussed included but not limited to the risk of infection, bleeding, damage nearby structures, bowel injury, anesthesia complications, prolonged ICU stay, need for rehab after the  hospital stay and death.  After full discussion all questions answered the patient agreed and consent to proceed.  Findings: Dense adhesions between the ventral hernia and the intestines as well as interloop adhesions between the intestines   Description of Procedure:   On the date stated above the patient taken operating room suite and placed in supine position.  General endotracheal esthesia was induced.  A timeout was completed verifying the correct patient, procedure, positioning, and equipment for the case.  The patient abdomen was prepped and draped in usual sterile fashion.  I made a small incision at Palmer's point and inserted the Veress needle and inflated the abdomen to 15 mmHg.  A 5 mm trocar was placed in the left upper quadrant and the abdomen was inspected.  Three 5 mm trocars were placed across the lower abdomen.  I began by lysing the adhesions between the bowel and the anterior abdominal wall.  This was performed sharply using laparoscopic scissors.  I was able to fully clear the anterior abdominal wall and the large ventral hernia of adhesions allowing the bowel to return down into the abdomen.  I then worked from the terminal ileum proximally working through the small intestine to lyse adhesions between the loops of intestine and between the retroperitoneum.  A full lysis of adhesions was completed.  There were a few areas of matted small bowel however these did not appear to be problematic adhesions.  There was 1 location where I was concerned about a serosal injury to the bowel that I oversewed using three 2-0 Vicryl sutures to imbricate this area.  All problematic adhesions were lysed and the  bowel was run from the terminal ileum back to the proximal dilated small intestine.  We spent 45 minutes lysing adhesions.  At the end of the case I was confident that the bowel obstruction had been treated.  The hernias were not addressed.  The abdomen was deflated.  The 5 mm trocars were closed  using 4-0 Monocryl and Dermabond.  All sponge needle counts were correct at the end of this case.  At the end of the case we reviewed the infection status of the case. Patient: Gary Rice [facility] Patient Case: Urgent Infection Present At Time Of Surgery (PATOS): None  Deward Foy, MD General, Bariatric, & Minimally Invasive Surgery [facility], [place]

## 2024-06-16 NOTE — Anesthesia Procedure Notes (Addendum)
 Arterial Line Insertion Start/End9/19/2025 7:19 AM, 06/16/2024 7:23 AM Performed by: Harrold Macintosh, CRNA, CRNA  Patient location: Pre-op. Preanesthetic checklist: patient identified, IV checked, site marked, risks and benefits discussed, surgical consent, monitors and equipment checked, pre-op evaluation, timeout performed and anesthesia consent Lidocaine  1% used for infiltration Left, radial was placed Hand hygiene performed  and maximum sterile barriers used  Allen's test indicative of satisfactory collateral circulation Attempts: 1 Procedure performed using ultrasound guided technique. Ultrasound Notes:anatomy identified, needle tip was noted to be adjacent to the nerve/plexus identified, no ultrasound evidence of intravascular and/or intraneural injection and image(s) printed for medical record Following insertion, Biopatch. Post procedure assessment: normal and unchanged  Patient tolerated the procedure well with no immediate complications.

## 2024-06-16 NOTE — Progress Notes (Signed)
 Nutrition Follow-up  DOCUMENTATION CODES:  Not applicable  INTERVENTION:  Continue TPN at goal rate Will monitor for ability to resume enteral nutrition and adequacy of intake Would not recommend discontinuing TPN until pt is meeting >/=60% of minimum estimated nutrition needs via enteral nutrition  NUTRITION DIAGNOSIS:  Inadequate oral intake related to inability to eat as evidenced by NPO status. - remains applicable  GOAL:  Patient will meet greater than or equal to 90% of their needs - goal met via TPN  MONITOR:  Diet advancement, Labs, Weight trends, Skin, I & O's, Other (Comment) (TPN)  REASON FOR ASSESSMENT:  Consult New TPN/TNA  ASSESSMENT:  89 y/o male with h/o PAF, HLD, SVT, AAA, dysphagia (followed by outpatient SLP) and bilateral inguinal hernias s/p open right inguinal hernia repair with mesh (05/03/2021) complicated by SBO with ischemic bowel s/p diagnostic laparoscopy with open small bowel resection (19.8cm) with primary anastomosis (05/09/2021) complicated by intra-abdominal abscess and who is now admitted with SBO, aspiration PNA and sepsis.  Pt just returning from Surgery at time of visit.  He is now s/p laparoscopic lysis of adhesions today for treatment of bowel obstruction.  Plan to continue TPN post operatively given NGT remaining to suction.   Continues on TPN at goal-  TPN rate is 70 mL/hr provides 85g AA, 235g CHO, 49g ILE and 1627 kCal per day   Admit weight: 61.2 kg Current weight: 63.1 kg  Drains/lines: NGT to suction- x24h ours  Medications: SSI 0-9 units q6h  Labs:  Sodium 134 BUN 5 CBG's 110-131 x24 hours  NUTRITION - FOCUSED PHYSICAL EXAM: Will obtain on follow up.   Diet Order:   Diet Order             Diet NPO time specified  Diet effective now                   EDUCATION NEEDS:  Not appropriate for education at this time  Skin:  Skin Assessment: Skin Integrity Issues: Skin Integrity Issues:: DTI DTI: medial  buttocks  Last BM:  9/18 type 7 x4 small/medium  Height:  Ht Readings from Last 1 Encounters:  06/16/24 (P) 5' 8 (1.727 m)    Weight:  Wt Readings from Last 1 Encounters:  06/16/24 63.1 kg    Ideal Body Weight:  70 kg  BMI:  Body mass index is 21.15 kg/m (pended).  Estimated Nutritional Needs:   Kcal:  1700-2000kcal/day  Protein:  85-100g/day  Fluid:  1.7-2.0L/day  Royce Maris, RDN, LDN Clinical Nutrition See AMiON for contact information.

## 2024-06-16 NOTE — Anesthesia Procedure Notes (Signed)
 Procedure Name: Intubation Date/Time: 06/16/2024 8:11 AM  Performed by: Harrold Macintosh, CRNAPre-anesthesia Checklist: Patient identified, Emergency Drugs available, Suction available and Patient being monitored Patient Re-evaluated:Patient Re-evaluated prior to induction Oxygen Delivery Method: Circle system utilized Preoxygenation: Pre-oxygenation with 100% oxygen Induction Type: IV induction and Rapid sequence Laryngoscope Size: Glidescope and 4 Tube type: Oral Number of attempts: 1 Airway Equipment and Method: Stylet, Video-laryngoscopy and Bite block Placement Confirmation: ETT inserted through vocal cords under direct vision, positive ETCO2 and breath sounds checked- equal and bilateral Secured at: 23 cm Tube secured with: Tape Dental Injury: Teeth and Oropharynx as per pre-operative assessment  Comments: Elective glidescope; pt unable to lie flat for induction. Kyphotic posture

## 2024-06-16 NOTE — Transfer of Care (Signed)
 Immediate Anesthesia Transfer of Care Note  Patient: Gary Rice  Procedure(s) Performed: LAPAROSCOPY, DIAGNOSTIC LYSIS, ADHESIONS, LAPAROSCOPIC (Abdomen) REPAIR, SMALL INTESTINE  Patient Location: PACU  Anesthesia Type:General  Level of Consciousness: drowsy, patient cooperative, and responds to stimulation  Airway & Oxygen Therapy: Patient Spontanous Breathing and Patient connected to nasal cannula oxygen  Post-op Assessment: Report given to RN, Post -op Vital signs reviewed and stable, Patient moving all extremities X 4, and Patient able to stick tongue midline  Post vital signs: Reviewed and stable  Last Vitals:  Vitals Value Taken Time  BP 101/63 06/16/24 10:00  Temp 36.4 C 06/16/24 09:56  Pulse 92 06/16/24 10:00  Resp 21 06/16/24 10:00  SpO2 93 % 06/16/24 10:00  Vitals shown include unfiled device data.  Last Pain:  Vitals:   06/16/24 0720  TempSrc:   PainSc: 0-No pain      Patients Stated Pain Goal: 0 (06/15/24 2000)  Complications: No notable events documented.

## 2024-06-16 NOTE — Progress Notes (Signed)
 Cardiology Progress Note  Patient ID: Gary Rice [facility] MRN: [id number] DOB: 07-Jul-1931 Date of Encounter: 06/16/2024 Primary Cardiologist: None  Subjective   Chief Complaint: Abdominal pain  HPI: SBO surgery today. Recovering in room. No complaints.   ROS:  All other ROS reviewed and negative. Pertinent positives noted in the HPI.     Telemetry  Overnight telemetry shows Afib 90-110 bpm, which I personally reviewed.   Physical Exam   Vitals:   06/16/24 1026 06/16/24 1100 06/16/24 1127 06/16/24 1200  BP: 95/67 (!) 87/66  (!) 87/59  Pulse: 89 97 90 94  Resp: 20 (!) 37 (!) 26 (!) 24  Temp: 97.6 F (36.4 C)  97.6 F (36.4 C)   TempSrc:   Oral   SpO2: 94% 94% 98% 95%  Weight:      Height:        Intake/Output Summary (Last 24 hours) at 06/16/2024 1248 Last data filed at 06/16/2024 1200 Gross per 24 hour  Intake 3427.89 ml  Output 3765 ml  Net -337.11 ml       06/16/2024    4:37 AM 06/15/2024    4:10 AM 06/14/2024    5:00 AM  Last 3 Weights  Weight (lbs) 139 lb 1.8 oz 151 lb 7.3 oz 154 lb 12.2 oz  Weight (kg) 63.1 kg 68.7 kg 70.2 kg    Body mass index is 21.15 kg/m (pended).  General: Frail Head: Atraumatic, normal size  Eyes: PEERLA, EOMI  Neck: Supple, no JVD Endocrine: No thryomegaly Cardiac: Normal S1, S2; irregular rhythm Lungs: Clear to auscultation bilaterally, no wheezing, rhonchi or rales  Abd: Distended abdomen Ext: No edema, pulses 2+ Musculoskeletal: No deformities, BUE and BLE strength normal and equal Skin: Warm and dry, no rashes   Neuro: Alert and oriented to person, place, time, and situation, CNII-XII grossly intact, no focal deficits  Psych: Normal mood and affect   Cardiac Studies  TTE 06/10/2024  1. Technically difficult study with limited views. Left ventricular  ejection fraction, by estimation, is 50 to 55%. The left ventricle has low  normal function. Left ventricular endocardial border not optimally defined  to evaluate regional wall  motion.  There is mild left ventricular hypertrophy. Left ventricular diastolic  parameters are indeterminate.   2. Right ventricular systolic function was not well visualized. The right  ventricular size is not well visualized.   3. The mitral valve is degenerative. Trivial mitral valve regurgitation.   4. The aortic valve was not well visualized. Aortic valve regurgitation  is trivial. No aortic stenosis is present.    Patient Profile  Gary Rice is a 89 y.o. male with paroxysmal atrial fibrillation, SVT, ascending aortic aneurysm, hyperlipidemia admitted on 06/05/2024 with small bowel obstruction and A-fib with RVR.  Course complicated by GI bleed and aspiration pneumonia.  Status post lysis of adhesions with general surgery on 06/16/2024.  Assessment & Plan   # Atrial fibrillation with RVR # Atrial flutter - Has had A-fib and a flutter while here.  Currently here with small bowel obstruction.  Ultimately underwent surgery today. - Also complicated by pneumonia.  On antibiotics. - He has been strict NPO. - Course has been complicated by hypotension and difficult to control A-fib.  Currently on IV amiodarone  drip.  Would recommend to continue this while NPO.  Also on IV digoxin  0.125 mg daily. - Would plan to continue current medications.  Once his postoperative course improves and he can tolerate oral medications we will work to  rate control his A-fib.  Currently we have to continue with IV medications. - Was on heparin  drip and had hemoptysis.  Will likely need to rechallenge him on anticoagulation at some point but now is not the time.  He needs to recover from surgery. - Unclear what long-term prognosis will be.  He has advanced age and extensive hospitalization.  Will need to see how he fully recovers. - Suspect we will favor a rate control strategy given all of his issues.  He will need time to recover.  # Hemoptysis # Bloody emesis - Heparin  on hold.  Hemoglobin recovered  from surgery.  Likely will need to be rechallenged with anticoagulation.  For now hold this. - We will need guidance from the primary team on when he can be rechallenged with anticoagulation.  However there is no rush to do this.  # Small bowel obstruction - Status post operative intervention today.  Seems to be recovering well. - Currently strict NPO.  On TPN.  # Acute hypoxic respiratory failure # Aspiration pneumonia - BNP was elevated.  He was given diuresis.  He did undergo fluid resuscitation in the setting of sepsis.  He does not appear volume overloaded to me.  Would hold further IV diuresis.  Suspect his BNP is elevated in the setting of critical illness and rapid A-fib. - Continue antibiotics per primary team.  # Ascending aortic aneurysm - Not an issue.  46 mm.  # AKI - improved.      For questions or updates, please contact [facility] Please consult [email or web address] for contact info under      Signed, Darryle T. Barbaraann, MD, [facility] [place]  [facility]  06/16/2024 12:48 PM

## 2024-06-16 NOTE — Progress Notes (Signed)
 OT Cancellation Note  Patient Details Name: Gary Rice MRN: [id number] DOB: 01/09/1931   Cancelled Treatment:    Reason Eval/Treat Not Completed: Patient at procedure or test/ unavailable (Patient off unit in procedure. OT to follow up as needed.)  Gary Rice 06/16/2024, 7:04 AM  Gary Rice, OTA Acute Rehabilitation Services  Office [phone]

## 2024-06-16 NOTE — Anesthesia Postprocedure Evaluation (Signed)
 Anesthesia Post Note  Patient: Gary Rice  Procedure(s) Performed: LAPAROSCOPY, DIAGNOSTIC LYSIS, ADHESIONS, LAPAROSCOPIC (Abdomen) REPAIR, SMALL INTESTINE     Patient location during evaluation: PACU Anesthesia Type: General Level of consciousness: awake and alert Pain management: pain level controlled Vital Signs Assessment: post-procedure vital signs reviewed and stable Respiratory status: spontaneous breathing, nonlabored ventilation, respiratory function stable and patient connected to nasal cannula oxygen Cardiovascular status: blood pressure returned to baseline and stable Postop Assessment: no apparent nausea or vomiting Anesthetic complications: no   No notable events documented.  Last Vitals:  Vitals:   06/16/24 1300 06/16/24 1400  BP: 102/73 101/71  Pulse: 92 86  Resp: (!) 22 (!) 39  Temp:    SpO2: 100% 100%    Last Pain:  Vitals:   06/16/24 1200  TempSrc:   PainSc: Asleep                 Thom JONELLE Peoples

## 2024-06-16 NOTE — Progress Notes (Signed)
 PHARMACY - TOTAL PARENTERAL NUTRITION CONSULT NOTE  Indication: Small bowel obstruction  Patient Measurements: Height: (P) 5' 8 (172.7 cm) Weight: 63.1 kg (139 lb 1.8 oz) IBW/kg (Calculated) : (P) 68.4 TPN AdjBW (KG): 64.6 Body mass index is 21.15 kg/m (pended).  Assessment:  42 YOM presented on 9/8 with constipation, found to have a SBO.  PMH significant for inguinal hernia post repair and SBR in 2022.  Patient was started on CLD on 9/11 after NGT removal and then advanced to FLD on 9/12 from which he vomited.  Patient was hypotensive and in Afib on 9/13, requiring transfer to the ICU.  NGT reinserted and diet changed back to NPO due to aspiration.  Patient has inadequate oral intake since admission and Pharmacy consulted to manage TPN for SBO.  He might require surgery and is at risk for refeeding.  Surgery plans on OR this week Thursday or Friday.   Glucose / Insulin : no hx DM - CBGs < 150 (1 unit SSI / 24 hours)  Electrolytes: low Na/Cl (trending up), K 4.1, Phos 2.5, Mag 1.8, CoCa 9.5 Renal: SCr < 1, BUN up 25 Hepatic: LFTs WNL, alk phos 43, albumin  3.9 on admit now 2.1 Intake / Output; MIVF: NTG output 600 mL, UOP 1.9 ml/kg/hr  GI Imaging: none since TPN initiation GI Surgeries / Procedures: none since TPN initiation  Central access: 06/11/24 TPN start date: 06/11/24  Nutritional Goals: Goal TPN rate is 70 mL/hr provides 85g AA, 235g CHO, 49g ILE and 1627 kCal per day  RD Estimated Needs Total Energy Estimated Needs: 1700-2000kcal/day Total Protein Estimated Needs: 85-100g/day Total Fluid Estimated Needs: 1.7-2.0L/day  Current Nutrition:  NPO, NGT LIWS   Plan:  Continue TPN @ goal rate of 70 mL/hr at 1800  Electrolytes in TPN: Na 100mEq/L, K 23mEq/L, Ca 54mEq/L, Mg 10 mEq/L, Phos 25 mmol/L, Cl:Ac 2:1 Add standard MVI and trace elements to TPN Adjust to sensitive SSI Q6H Thiamine  100mg  IV daily x 5 doses Monitor TPN labs on Mon/Thurs - labs daily while experiencing  refeeding   Thank you for allowing pharmacy to be a part of this patient's care.  Shelba Collier, PharmD, BCPS Clinical Pharmacist

## 2024-06-16 NOTE — Plan of Care (Signed)

## 2024-06-16 NOTE — Progress Notes (Signed)
 Progress Note  * Day of Surgery *  Subjective: Seen in preop  Objective: Vital signs in last 24 hours: Temp:  [97.9 F (36.6 C)-99.2 F (37.3 C)] (P) 98.6 F (37 C) (09/19 0701) Pulse Rate:  [90-128] (P) 105 (09/19 0701) Resp:  [20-41] (P) 20 (09/19 0701) BP: (89-141)/(55-84) (P) 101/69 (09/19 0701) SpO2:  [87 %-98 %] (P) 98 % (09/19 0701) Weight:  [63.1 kg] 63.1 kg (09/19 0437) Last BM Date :  (PTA)  Intake/Output from previous day: 09/18 0701 - 09/19 0700 In: 2347.9 [I.V.:2276.9; IV Piggyback:70.9] Out: 3475 [Urine:2875; Emesis/NG output:600] Intake/Output this shift: No intake/output data recorded.  PE: General: pleasant, WD, elderly male who is laying in bed in NAD Heart: irregularly irregular in the 100-120s Lungs: normal effort on [place], no wheezing Abd: soft, NT, hernia soft, NGT with thin dark drainage Psych: A&Ox4 with an appropriate affect.    Lab Results:  Recent Labs    06/15/24 1639 06/16/24 0435  WBC 18.0* 15.2*  HGB 11.7* 10.0*  HCT 34.4* 28.6*  PLT 203 163   BMET Recent Labs    06/15/24 0450 06/16/24 0530  NA 132* 134*  K 4.2 4.1  CL 96* 102  CO2 24 25  GLUCOSE 194* 129*  BUN 14 25*  CREATININE 0.59* 0.86  CALCIUM  8.0* 8.2*   PT/INR No results for input(s): LABPROT, INR in the last 72 hours.  CMP     Component Value Date/Time   NA 134 (L) 06/16/2024 0530   NA 143 04/23/2022 1454   K 4.1 06/16/2024 0530   CL 102 06/16/2024 0530   CO2 25 06/16/2024 0530   GLUCOSE 129 (H) 06/16/2024 0530   BUN 25 (H) 06/16/2024 0530   BUN 23 04/23/2022 1454   CREATININE 0.86 06/16/2024 0530   CALCIUM  8.2 (L) 06/16/2024 0530   PROT 5.8 (L) 06/15/2024 0450   ALBUMIN  2.1 (L) 06/15/2024 0450   AST 19 06/15/2024 0450   ALT 18 06/15/2024 0450   ALKPHOS 43 06/15/2024 0450   BILITOT 0.5 06/15/2024 0450   GFRNONAA >60 06/16/2024 0530   GFRAA  08/14/2007 0312    >60        The eGFR has been calculated using the MDRD equation. This  calculation has not been validated in all clinical   Lipase     Component Value Date/Time   LIPASE 18 06/05/2024 1241       Studies/Results: DG CHEST PORT 1 VIEW Result Date: 06/15/2024 EXAM: 1 VIEW XRAY OF THE CHEST 06/15/2024 08:42:57 AM COMPARISON: 06/13/2024 CLINICAL HISTORY: CHF (congestive heart failure) (HCC) 02706. Reason for exam - CHF FINDINGS: LINES, TUBES AND DEVICES: Stable right PICC and nasogastric tube. LUNGS AND PLEURA: Increased right lung base opacification. Increased right pleural effusion. Stable trace left pleural effusion. HEART AND MEDIASTINUM: Ectatic thoracic aorta. BONES AND SOFT TISSUES: No acute osseous abnormality. IMPRESSION: 1. Right pleural effusion, increased, with worsening aeration in the left base. 2. Stable trace left pleural effusion. 3. Ectatic thoracic aorta. Electronically signed by: Waddell Calk MD 06/15/2024 01:38 PM EDT RP Workstation: [id number]     Anti-infectives: Anti-infectives (From admission, onward)    Start     Dose/Rate Route Frequency Ordered Stop   06/11/24 0915  piperacillin -tazobactam (ZOSYN ) IVPB 3.375 g        3.375 g 12.5 mL/hr over 240 Minutes Intravenous Every 8 hours 06/11/24 0820 06/16/24 0118   06/11/24 0800  cefTRIAXone (ROCEPHIN) 1 g in sodium chloride  0.9 % 100 mL IVPB  Status:  Discontinued        1 g 200 mL/hr over 30 Minutes Intravenous Every 24 hours 06/11/24 0642 06/11/24 0710   06/11/24 0800  cefTRIAXone (ROCEPHIN) 2 g in sodium chloride  0.9 % 100 mL IVPB  Status:  Discontinued        2 g 200 mL/hr over 30 Minutes Intravenous Every 24 hours 06/11/24 0710 06/11/24 0815        Assessment/Plan SBO - bowel obstruction does not appear to be resolving - in ICU with aspiration PNA - supplemental oxygen weaning - on TPN, would continue in setting of ongoing obstruction with NGT to LIWS - I do not think he will ever be fully ready for the OR, but I think he has to go to the OR to address his bowel obstruction  or deal with an NG tube for the rest of his life.  I discussed this with the patient who voiced understanding and agreement.  He would like to have surgery and attempt to address this bowel obstruction.  I recommended laparoscopic, possibly open, abdominal exploration with possible bowel resection.  We discussed the surgery itself as well as its risk, benefits, and alternatives.  Risk discussed include but not limited to the risk of infection, bleeding, damage nearby structures, need for bowel resection, prolonged intubation, prolonged ICU stay, and death.  He understands he is high risk for this procedure.  After full discussion all questions answered the patient voiced understanding and consent to proceed.  We will proceed with surgery this morning.  FEN: NPO, TPN, NGT to LIWS VTE: hep gtt ok  ID: Zosyn  for aspiration PNA  - per TRH/CCM -  ?Aspiration PNA - currently on HFNC Chronic A. Fib with RVR AKI HLD    LOS: 11 days   I reviewed Consultant palliative, pulmonolgy, cardiology notes, hospitalist notes, last 24 h vitals and pain scores, last 48 h intake and output, last 24 h labs and trends, and last 24 h imaging results.  This care required high level of medical decision making.    Deward JINNY Foy, MD  [facility] 06/16/2024, 7:22 AM Please see Amion for pager number during day hours 7:00am-4:30pm

## 2024-06-16 NOTE — Plan of Care (Signed)
     Referral previously received for [facility] for goals of care discussion. Noted most recent palliative in-person assessment dated 06/14/2024 at which time it was recommended to follow from a distance/chart check and reengage if/when appropriate.  Chart reviewed for Recent provider notes, nurse notes, vitals, and labs and updates received from surgeon.   At this time patient appears to have tolerated surgery well, was able to keep today laparoscopic procedure, keep NG tube until bowel movements. No plan for in person follow-up at this time but will continue to follow peripherally for any needs and reengagement.   Please contact the palliative medicine provider on service for any new/urgent needs that require our assistance with this patient.  Thank you for your referral and allowing PMT to assist in Gary Rice's care.   Camellia Kays, NP Palliative Medicine Team Phone: [phone]  NO CHARGE

## 2024-06-17 ENCOUNTER — Inpatient Hospital Stay (HOSPITAL_COMMUNITY)

## 2024-06-17 ENCOUNTER — Encounter (HOSPITAL_COMMUNITY): Payer: Self-pay | Admitting: Surgery

## 2024-06-17 DIAGNOSIS — I471 Supraventricular tachycardia, unspecified: Secondary | ICD-10-CM

## 2024-06-17 DIAGNOSIS — I480 Paroxysmal atrial fibrillation: Secondary | ICD-10-CM | POA: Diagnosis not present

## 2024-06-17 DIAGNOSIS — K56609 Unspecified intestinal obstruction, unspecified as to partial versus complete obstruction: Secondary | ICD-10-CM | POA: Diagnosis not present

## 2024-06-17 LAB — GLUCOSE, CAPILLARY
Glucose-Capillary: 121 mg/dL — ABNORMAL HIGH (ref 70–99)
Glucose-Capillary: 126 mg/dL — ABNORMAL HIGH (ref 70–99)
Glucose-Capillary: 130 mg/dL — ABNORMAL HIGH (ref 70–99)
Glucose-Capillary: 123 mg/dL — ABNORMAL HIGH (ref 70–99)

## 2024-06-17 LAB — BASIC METABOLIC PANEL WITH GFR
Anion gap: 7 (ref 5–15)
BUN: 23 mg/dL (ref 8–23)
CO2: 25 mmol/L (ref 22–32)
Calcium: 8.3 mg/dL — ABNORMAL LOW (ref 8.9–10.3)
Chloride: 102 mmol/L (ref 98–111)
Creatinine, Ser: 0.57 mg/dL — ABNORMAL LOW (ref 0.61–1.24)
GFR, Estimated: >60 mL/min (ref >60)
Glucose, Bld: 127 mg/dL — ABNORMAL HIGH (ref 70–99)
Potassium: 4.5 mmol/L (ref 3.5–5.1)
Sodium: 134 mmol/L — ABNORMAL LOW (ref 135–145)

## 2024-06-17 LAB — CBC
HCT: 27.7 % — ABNORMAL LOW (ref 39.0–52.0)
Hemoglobin: 9.2 g/dL — ABNORMAL LOW (ref 13.0–17.0)
MCH: 31.3 pg (ref 26.0–34.0)
MCHC: 33.2 g/dL (ref 30.0–36.0)
MCV: 94.2 fL (ref 80.0–100.0)
Platelets: 173 K/uL (ref 150–400)
RBC: 2.94 MIL/uL — ABNORMAL LOW (ref 4.22–5.81)
RDW: 13.3 % (ref 11.5–15.5)
WBC: 14.9 K/uL — ABNORMAL HIGH (ref 4.0–10.5)
nRBC: 0 % (ref 0.0–0.2)

## 2024-06-17 LAB — MAGNESIUM: Magnesium: 2 mg/dL (ref 1.7–2.4)

## 2024-06-17 LAB — PHOSPHORUS: Phosphorus: 2.8 mg/dL (ref 2.5–4.6)

## 2024-06-17 MED ORDER — TRAVASOL 10 % IV SOLN
INTRAVENOUS | Status: AC
  Filled 2024-06-17: qty 848.4

## 2024-06-17 NOTE — Plan of Care (Signed)

## 2024-06-17 NOTE — Plan of Care (Signed)
 Reviewed POC with pt upon arrival to unit.  Expect to remove NGT tomorrow if pt has no evidence of nausea or vomiting.  Pt verbalized understanding

## 2024-06-17 NOTE — Progress Notes (Signed)
 PHARMACY - TOTAL PARENTERAL NUTRITION CONSULT NOTE  Indication: Small bowel obstruction  Patient Measurements: Height: (P) 5' 8 (172.7 cm) Weight: 68.2 kg (150 lb 5.7 oz) IBW/kg (Calculated) : (P) 68.4 TPN AdjBW (KG): 64.6 Body mass index is 22.86 kg/m (pended).  Assessment:  44 YOM presented on 9/8 with constipation, found to have a SBO.  PMH significant for inguinal hernia post repair and SBR in 2022.  Patient was started on CLD on 9/11 after NGT removal and then advanced to FLD on 9/12 from which he vomited.  Patient was hypotensive and in Afib on 9/13, requiring transfer to the ICU.  NGT reinserted and diet changed back to NPO due to aspiration.  Patient has inadequate oral intake since admission and Pharmacy consulted to manage TPN for SBO.  He might require surgery and is at risk for refeeding.  Surgery plans on OR this week Thursday or Friday.   Glucose / Insulin : no hx DM - CBGs < 150 (4 units SSI / 24 hours)  Electrolytes: low Na/Cl (trending up), K 4.5, Phos 2.8, Mag 2.0, CoCa 9.5 Renal: SCr < 1, BUN up 25 Hepatic: LFTs WNL, alk phos 43, albumin  3.9 on admit now 2.1 Intake / Output; MIVF: NTG output 150 mL, UOP 1.0 ml/kg/hr  GI Imaging: none since TPN initiation GI Surgeries / Procedures: none since TPN initiation  Central access: 06/11/24 TPN start date: 06/11/24  Nutritional Goals: Goal TPN rate is 70 mL/hr provides 85g AA, 235g CHO, 49g ILE and 1627 kCal per day  RD Estimated Needs Total Energy Estimated Needs: 1700-2000kcal/day Total Protein Estimated Needs: 85-100g/day Total Fluid Estimated Needs: 1.7-2.0L/day  Current Nutrition:  NPO, NGT LIWS   Plan:  Continue TPN @ goal rate of 70 mL/hr at 1800  Electrolytes in TPN: increase to Na 110 mEq/L, decrease to K 60 mEq/L, Ca 5 mEq/L, Mg 10 mEq/L, Phos 25 mmol/L, Cl:Ac 2:1 Add standard MVI and trace elements to TPN Continue insulin  sensitive SSI Q6H Thiamine  100mg  IV daily x 5 doses Monitor TPN labs on Mon/Thurs  - labs daily while experiencing refeeding   Thank you for allowing pharmacy to be a part of this patient's care.  Shelba Collier, PharmD, BCPS Clinical Pharmacist

## 2024-06-17 NOTE — Progress Notes (Addendum)
  Progress Note  Patient Name: Gary Rice [facility] Date of Encounter: 06/17/2024 [facility] Cardiologist: None   Interval Summary   Feeling better.  Abdomen is still uncomfortable, but improved. Converted back to normal sinus rhythm roughly 2 hours ago.  Vital Signs Vitals:   06/17/24 1001 06/17/24 1002 06/17/24 1003 06/17/24 1109  BP:      Pulse: (!) 57 (!) 56 (!) 56   Resp: (!) 25 (!) 21 (!) 24   Temp:    98.6 F (37 C)  TempSrc:    Oral  SpO2: 97% 97% 96%   Weight:      Height:        Intake/Output Summary (Last 24 hours) at 06/17/2024 1209 Last data filed at 06/17/2024 1000 Gross per 24 hour  Intake 2238.5 ml  Output 1575 ml  Net 663.5 ml      06/17/2024    5:00 AM 06/16/2024    4:37 AM 06/15/2024    4:10 AM  Last 3 Weights  Weight (lbs) 150 lb 5.7 oz 139 lb 1.8 oz 151 lb 7.3 oz  Weight (kg) 68.2 kg 63.1 kg 68.7 kg      Telemetry/ECG  Sinus rhythm with frequent PACs- Personally Reviewed  Physical Exam  GEN: No acute distress.   Neck: No JVD Cardiac: Occasional ectopy on background of RRR, no murmurs, rubs, or gallops.  Respiratory: Clear to auscultation bilaterally. GI: Soft, nontender, non-distended.  Ventral hernia.  Healthy surgical scar.  Few bowel sounds. MS: No edema  Assessment & Plan  89 year old man with history of paroxysmal atrial fibrillation and SVT, ascending aortic aneurysm, hyperlipidemia, who has been in the hospital for the last 12 days with small bowel obstruction, complicated by episodes of atrial fibrillation rapid ventricular response and atrial flutter.  Had surgery yesterday with improved clinical status and resolution of arrhythmia. Will continue intravenous amiodarone  but will stop the intravenous digoxin .  Reduce amiodarone  to 30 mg/h. Recent problems with bleeding, will not resume anticoagulants for the time being.   For questions or updates, please contact [facility] Please consult [email or web address] for contact  info under         Signed, Jerel Balding, MD

## 2024-06-17 NOTE — Progress Notes (Signed)
 [facility] Progress Note  1 Day Post-Op  Subjective: CC:  NAEO. Denies flatus or BM since surgery yesterday. States before surgery he had gas a multiple stools.  Pain overall controlled.    Objective: Vital signs in last 24 hours: Temp:  [97.6 F (36.4 C)-98.2 F (36.8 C)] 98.2 F (36.8 C) (09/20 0735) Pulse Rate:  [80-106] 97 (09/20 0815) Resp:  [17-51] 32 (09/20 0815) BP: (87-112)/(54-81) 111/69 (09/20 0800) SpO2:  [89 %-100 %] 94 % (09/20 0815) Arterial Line BP: (98-149)/(49-75) 125/54 (09/20 0815) Weight:  [68.2 kg] 68.2 kg (09/20 0500) Last BM Date :  (PTA)  Intake/Output from previous day: 09/19 0701 - 09/20 0700 In: 3430.1 [I.V.:2830.1; IV Piggyback:600] Out: 1765 [Urine:1585; Emesis/NG output:150; Blood:30] Intake/Output this shift: Total I/O In: 307.5 [I.V.:307.5] Out: 200 [Urine:200]  PE: Gen:  Alert, NAD, pleasant Card: irregular, HR 90's Pulm:  Normal effort on nasal cannula Abd: Soft, mild distention, ventral hernia is soft and temporarily reduces. Lap incisions c/d/I with dermabond.  NG in place with minimal clear effluent (< ) clamped by me GU: foley draining clear, yellow urine (1600 mL) Skin: warm and dry, no rashes; RUE thrombophlebitis  Psych: A&Ox3   Lab Results:  Recent Labs    06/16/24 0435 06/17/24 0430  WBC 15.2* 14.9*  HGB 10.0* 9.2*  HCT 28.6* 27.7*  PLT 163 173   BMET Recent Labs    06/16/24 0530 06/17/24 0430  NA 134* 134*  K 4.1 4.5  CL 102 102  CO2 25 25  GLUCOSE 129* 127*  BUN 25* 23  CREATININE 0.86 0.57*  CALCIUM  8.2* 8.3*   PT/INR No results for input(s): LABPROT, INR in the last 72 hours. CMP     Component Value Date/Time   NA 134 (L) 06/17/2024 0430   NA 143 04/23/2022 1454   K 4.5 06/17/2024 0430   CL 102 06/17/2024 0430   CO2 25 06/17/2024 0430   GLUCOSE 127 (H) 06/17/2024 0430   BUN 23 06/17/2024 0430   BUN 23 04/23/2022 1454   CREATININE 0.57 (L) 06/17/2024 0430   CALCIUM   8.3 (L) 06/17/2024 0430   PROT 5.8 (L) 06/15/2024 0450   ALBUMIN  2.1 (L) 06/15/2024 0450   AST 19 06/15/2024 0450   ALT 18 06/15/2024 0450   ALKPHOS 43 06/15/2024 0450   BILITOT 0.5 06/15/2024 0450   GFRNONAA >60 06/17/2024 0430   GFRAA  08/14/2007 0312    >60        The eGFR has been calculated using the MDRD equation. This calculation has not been validated in all clinical   Lipase     Component Value Date/Time   LIPASE 18 06/05/2024 1241       Studies/Results: DG CHEST PORT 1 VIEW Result Date: 06/15/2024 EXAM: 1 VIEW XRAY OF THE CHEST 06/15/2024 08:42:57 AM COMPARISON: 06/13/2024 CLINICAL HISTORY: CHF (congestive heart failure) (HCC) [address]2706. Reason for exam - CHF FINDINGS: LINES, TUBES AND DEVICES: Stable right PICC and nasogastric tube. LUNGS AND PLEURA: Increased right lung base opacification. Increased right pleural effusion. Stable trace left pleural effusion. HEART AND MEDIASTINUM: Ectatic thoracic aorta. BONES AND SOFT TISSUES: No acute osseous abnormality. IMPRESSION: 1. Right pleural effusion, increased, with worsening aeration in the left base. 2. Stable trace left pleural effusion. 3. Ectatic thoracic aorta. Electronically signed by: Waddell Calk MD 06/15/2024 01:38 PM EDT RP Workstation: [id number]    Anti-infectives: Anti-infectives (From admission, onward)    Start     Dose/Rate Route Frequency Ordered Stop  06/11/24 0915  piperacillin -tazobactam (ZOSYN ) IVPB 3.375 g        3.375 g 12.5 mL/hr over 240 Minutes Intravenous Every 8 hours 06/11/24 0820 06/16/24 0118   06/11/24 0800  cefTRIAXone (ROCEPHIN) 1 g in sodium chloride  0.9 % 100 mL IVPB  Status:  Discontinued        1 g 200 mL/hr over 30 Minutes Intravenous Every 24 hours 06/11/24 0642 06/11/24 0710   06/11/24 0800  cefTRIAXone (ROCEPHIN) 2 g in sodium chloride  0.9 % 100 mL IVPB  Status:  Discontinued        2 g 200 mL/hr over 30 Minutes Intravenous Every 24 hours 06/11/24 0710 06/11/24 0815         Assessment/Plan  SBO, admitted 9/8 and failed non-operative measures, ultimately required surgery 9/19 as below POD#1 s/p diagnostic laparoscopy, laparoscopic LOA 45 minutes  - afebrile, VSS, hemodynamically stable  - WBC 14.9 from 15.2 yesterday. Hgb overall stable at 9.2 from 10.0 - await return of bowel function, NG is low output, clamp trial today - will either remove this afternoon or return to LIWS   FEN: NPO, NG clamped, IVF per primary, TPN  ID: completed abx for aspiration  Foley: ok to D/C foley from C[facility] standpoint Dispo: in ICU at present, ok to transfer to progressive from [facility] standpoint  Recommend heat/elevation for thrombophlebitis, consider NSAIDs  Below per primary-- Afib RVR - rate controlled now Aspiration pneumonia AKI HLD Aortic aneurysm  Normocytic anemia     LOS: 12 days   I reviewed Consultant cardiology, palliative notes, hospitalist notes, last 24 h vitals and pain scores, last 48 h intake and output, last 24 h labs and trends, and last 24 h imaging results.  This care required moderate level of medical decision making.   Almarie Pringle, PA-C [facility] Please see Amion for pager number during day hours 7:00am-4:30pm

## 2024-06-17 NOTE — Progress Notes (Signed)
 PROGRESS NOTE  Gary Rice [facility]  FMW:[id number] DOB: 25-May-1931 DOA: 06/05/2024 PCP: Kip Righter, MD  Consultants  Brief Narrative: 89 y.o. male with history of A-fib-presented with SBO and A-fib RVR.   SBO initially managed with conservative measures with some improvement-A-fib RVR managed with Cardizem /heparin  infusion-unfortunately-on 9/13-developed worsening gastric distention-vomited and aspirated, along with A-fib RVR and hypotension.  He was subsequently transferred to the ICU for close monitoring.  Started on antibiotics for aspiration pneumonia, critical care, cardiology consulted and general surgery following.   Assessment & Plan:  SBO - s/p abdominal surgery yesterday 9/19. - recovering.  Feels pain is adequately controlled. -NG tube still in place, but has been clamped. - Not really passing much gas yet. -Appreciate surgery input.   Chronic A-fib with RVR/hemoptysis/epistaxis: Initially rate controlled with Cardizem /beta-blocker but due to worsening bowel obstruction/aspiration--> converted to sinus rhythm this afternoon. -Dig drip stopped.  Remains on amiodarone  drip. - Anticoagulation being held secondary to hemoptysis EVM 9/18 in fact he just had surgery. - Will eventually need AC restarted, hold for now.   Acute hypoxic respiratory failure secondary to aspiration pneumonia: Likely due to aspiration of vomitus, patient on Zosyn , currently on 6 L of oxygen.   - No longer fluid overloaded. - Hold on diuresis and continue antibiotics for now. - Cough also greatly improving, although unable to wean down his O2 yet.    Acute blood loss anemia:  - Came in with normal hemoglobin, was noted to have hemoptysis as well as coffee-ground emesis which was positive for occult blood - Slow drop in hemoglobin, again after surgery yesterday. - No need for a blood transfusion at this time.  Will trend hemoglobin.  Holding AC as above.     AKI Hemodynamically mediated Resolved.    Acute hyponatremia:  - Hovering around 130.  He is asymptomatic.  Monitor.  Stable.    Hypokalemia Resolved.   HLD Hold statins for now-resume when oral intake stable.   Aortic aneurysm: 4.6 cm.  Needs surveillance.   GOC: Admitted as a full code however previous hospitalist as well as palliative care had lengthy discussion with the patient and the family and subsequently he was transition to DNR.  Nutrition Problem: Inadequate oral intake Etiology: inability to eat Signs/Symptoms: NPO status Interventions: TPN Wound 06/11/24 0740 Pressure Injury Buttocks Medial Deep Tissue Pressure Injury - Purple or maroon localized area of discolored intact skin or blood-filled blister due to damage of underlying soft tissue from pressure and/or shear. (Active)   DVT prophylaxis:  Place and maintain sequential compression device Start: 06/14/24 0857  Code Status:   Code Status: Limited: Do not attempt resuscitation (DNR) -DNR-LIMITED -Do Not Intubate/DNI  Level of care: Progressive Status is: Inpatient  Consults called: General Surgery, cardiology, palliative care.  Subjective: Patient awake alert.  Pain under control.  Not really passing any flatus yet.   Objective: Vitals:   06/17/24 1145 06/17/24 1200 06/17/24 1215 06/17/24 1230  BP:  (!) 88/65    Pulse: (!) 59 (!) 59 62 66  Resp: 20 19 (!) 23 20  Temp:      TempSrc:      SpO2: 96% 96% (!) 89% 97%  Weight:      Height:        Intake/Output Summary (Last 24 hours) at 06/17/2024 1450 Last data filed at 06/17/2024 1400 Gross per 24 hour  Intake 2238.64 ml  Output 1800 ml  Net 438.64 ml   Filed Weights   06/15/24 0410 06/16/24  9562 06/17/24 0500  Weight: 68.7 kg 63.1 kg 68.2 kg   Body mass index is 22.86 kg/m (pended).  Gen: 89 y.o. male in no apparent distress.  Nontoxic, NGT in place.   Pulm: Non-labored breathing.  Clear to auscultation anterior chest CV: Irregular rhythm GI: Abdomen soft, laparoscopic incisions clean  dry intact.  No distention noted, abdominal hernia noted.  Only minimal tenderness to deep palpation Ext: Warm, no deformities Skin: No rashes, lesions ulcers Neuro: Alert and oriented. No focal neurological deficits. Psych: Calm  Judgement and insight appear normal. Mood & affect appropriate.    I have personally reviewed the following labs and images: CBC: Recent Labs  Lab 06/14/24 0430 06/15/24 0255 06/15/24 1639 06/16/24 0435 06/17/24 0430  WBC 16.0* 15.9* 18.0* 15.2* 14.9*  NEUTROABS  --   --  14.9* 12.0*  --   HGB 12.2* 11.8* 11.7* 10.0* 9.2*  HCT 36.3* 35.9* 34.4* 28.6* 27.7*  MCV 93.8 97.8 93.2 94.7 94.2  PLT 166 184 203 163 173   BMP &GFR Recent Labs  Lab 06/12/24 0540 06/13/24 0736 06/14/24 0430 06/15/24 0450 06/16/24 0530 06/17/24 0430  NA 134* 132* 130* 132* 134* 134*  K 3.4* 3.6 4.3 4.2 4.1 4.5  CL 99 95* 100 96* 102 102  CO2 27 23 24 24 25 25   GLUCOSE 128* 295* 147* 194* 129* 127*  BUN 19 14 13 14  25* 23  CREATININE 0.85 0.64 0.57* 0.59* 0.86 0.57*  CALCIUM  8.4* 7.8* 8.1* 8.0* 8.2* 8.3*  MG 1.8 1.9 1.9 1.8  --  2.0  PHOS 2.1* 2.2* 2.6 2.5  --  2.8   Estimated Creatinine Clearance: 58 mL/min (A) (by C-G formula based on SCr of 0.57 mg/dL (L)). Liver & Pancreas: Recent Labs  Lab 06/12/24 0540 06/13/24 0736 06/14/24 0430 06/15/24 0450  AST 14* 15 16 19   ALT 12 13 14 18   ALKPHOS 37* 37* 41 43  BILITOT 0.3 0.5 0.5 0.5  PROT 5.8* 5.7* 6.0* 5.8*  ALBUMIN  2.4* 2.6* 2.5* 2.1*   No results for input(s): LIPASE, AMYLASE in the last 168 hours. No results for input(s): AMMONIA in the last 168 hours. Diabetic: No results for input(s): HGBA1C in the last 72 hours. Recent Labs  Lab 06/16/24 1921 06/16/24 2325 06/17/24 0309 06/17/24 0630 06/17/24 1143  GLUCAP 120* 131* 121* 126* 130*   Cardiac Enzymes: No results for input(s): CKTOTAL, CKMB, CKMBINDEX, TROPONINI in the last 168 hours. No results for input(s): PROBNP in the last  8760 hours. Coagulation Profile: Recent Labs  Lab 06/12/24 1230  INR 1.2   Thyroid  Function Tests: No results for input(s): TSH, T4TOTAL, FREET4, T3FREE, THYROIDAB in the last 72 hours. Lipid Profile: No results for input(s): CHOL, HDL, LDLCALC, TRIG, CHOLHDL, LDLDIRECT in the last 72 hours. Anemia Panel: No results for input(s): VITAMINB12, FOLATE, FERRITIN, TIBC, IRON , RETICCTPCT in the last 72 hours. Urine analysis:    Component Value Date/Time   COLORURINE YELLOW 02/23/2022 1703   APPEARANCEUR CLEAR 02/23/2022 1703   LABSPEC 1.015 02/23/2022 1703   PHURINE 6.0 02/23/2022 1703   GLUCOSEU NEGATIVE 02/23/2022 1703   HGBUR NEGATIVE 02/23/2022 1703   BILIRUBINUR NEGATIVE 02/23/2022 1703   KETONESUR 5 (A) 02/23/2022 1703   PROTEINUR NEGATIVE 02/23/2022 1703   NITRITE NEGATIVE 02/23/2022 1703   LEUKOCYTESUR NEGATIVE 02/23/2022 1703   Sepsis Labs: Invalid input(s): PROCALCITONIN, LACTICIDVEN  Microbiology: Recent Results (from the past 240 hours)  MRSA Next Gen by PCR, Nasal     Status: None  Collection Time: 06/11/24  7:43 AM   Specimen: Nasal Mucosa; Nasal Swab  Result Value Ref Range Status   MRSA by PCR Next Gen NOT DETECTED NOT DETECTED Final    Comment: (NOTE) The GeneXpert MRSA Assay (FDA approved for NASAL specimens only), is one component of a comprehensive MRSA colonization surveillance program. It is not intended to diagnose MRSA infection nor to guide or monitor treatment for MRSA infections. Test performance is not FDA approved in patients less than 3 years old. Performed at [facility], [address] [place], [place] [address]     Radiology Studies: No results found.  Scheduled Meds:  Chlorhexidine  Gluconate Cloth  6 each Topical Q0600   Influenza vac split trivalent PF  0.5 mL Intramuscular Tomorrow-1000   insulin  aspart  0-9 Units Subcutaneous Q6H   ipratropium  2 spray Each Nare BID   pantoprazole   (PROTONIX ) IV  40 mg Intravenous Q12H   sodium chloride  flush  10-40 mL Intracatheter Q12H   sodium chloride  flush  3 mL Intravenous Q12H   Continuous Infusions:  amiodarone  30 mg/hr (06/17/24 1444)   TPN ADULT (ION) 70 mL/hr at 06/17/24 1200   TPN ADULT (ION)       LOS: 12 days   35 minutes with more than 50% spent in reviewing records, counseling patient/family and coordinating care.  Reyes VEAR Gaw, MD [facility] [email or web address] 06/17/2024, 2:50 PM

## 2024-06-17 NOTE — Plan of Care (Signed)
     Referral previously received for [facility] for goals of care discussion. Noted most recent palliative in-person assessment dated 06/14/2024 at which time it was recommended to follow from a distance/chart check and reengage if/when appropriate.  Chart reviewed for Recent provider notes, nurse notes, vitals, and labs and updates received from surgeon.   At this time patient appears to have tolerated surgery well, was able to keep today laparoscopic procedure.  No flatus or BM since surgery but NG output low so this has been clamped, possibly will be removed later today.  Transferred out of ICU to the floor.  Previously agreeable to DNR-Limited, continue full scope otherwise.  Please contact the palliative medicine provider on service for any new/urgent needs that require our assistance with this patient.  Thank you for your referral and allowing PMT to assist in Gary Rice's care.   Camellia Kays, NP Palliative Medicine Team Phone: [phone]  NO CHARGE

## 2024-06-18 DIAGNOSIS — K56609 Unspecified intestinal obstruction, unspecified as to partial versus complete obstruction: Secondary | ICD-10-CM | POA: Diagnosis not present

## 2024-06-18 DIAGNOSIS — I471 Supraventricular tachycardia, unspecified: Secondary | ICD-10-CM | POA: Diagnosis not present

## 2024-06-18 DIAGNOSIS — I480 Paroxysmal atrial fibrillation: Secondary | ICD-10-CM | POA: Diagnosis not present

## 2024-06-18 LAB — PHOSPHORUS: Phosphorus: 2.9 mg/dL (ref 2.5–4.6)

## 2024-06-18 LAB — GLUCOSE, CAPILLARY
Glucose-Capillary: 122 mg/dL — ABNORMAL HIGH (ref 70–99)
Glucose-Capillary: 127 mg/dL — ABNORMAL HIGH (ref 70–99)
Glucose-Capillary: 146 mg/dL — ABNORMAL HIGH (ref 70–99)

## 2024-06-18 LAB — BASIC METABOLIC PANEL WITH GFR
Anion gap: 8 (ref 5–15)
BUN: 25 mg/dL — ABNORMAL HIGH (ref 8–23)
CO2: 24 mmol/L (ref 22–32)
Calcium: 8.4 mg/dL — ABNORMAL LOW (ref 8.9–10.3)
Chloride: 102 mmol/L (ref 98–111)
Creatinine, Ser: 0.6 mg/dL — ABNORMAL LOW (ref 0.61–1.24)
GFR, Estimated: >60 mL/min (ref >60)
Glucose, Bld: 112 mg/dL — ABNORMAL HIGH (ref 70–99)
Potassium: 4.7 mmol/L (ref 3.5–5.1)
Sodium: 134 mmol/L — ABNORMAL LOW (ref 135–145)

## 2024-06-18 LAB — CBC
HCT: 27.3 % — ABNORMAL LOW (ref 39.0–52.0)
Hemoglobin: 9 g/dL — ABNORMAL LOW (ref 13.0–17.0)
MCH: 31.5 pg (ref 26.0–34.0)
MCHC: 33 g/dL (ref 30.0–36.0)
MCV: 95.5 fL (ref 80.0–100.0)
Platelets: 194 K/uL (ref 150–400)
RBC: 2.86 MIL/uL — ABNORMAL LOW (ref 4.22–5.81)
RDW: 13.5 % (ref 11.5–15.5)
WBC: 19.4 K/uL — ABNORMAL HIGH (ref 4.0–10.5)
nRBC: 0 % (ref 0.0–0.2)

## 2024-06-18 LAB — MAGNESIUM: Magnesium: 2.1 mg/dL (ref 1.7–2.4)

## 2024-06-18 LAB — PROCALCITONIN: Procalcitonin: <0.1 ng/mL

## 2024-06-18 MED ORDER — HEPARIN SODIUM (PORCINE) 5000 UNIT/ML IJ SOLN
5000.0000 [IU] | Freq: Three times a day (TID) | INTRAMUSCULAR | Status: DC
  Administered 2024-06-18 – 2024-06-22 (×12): 5000 [IU] via SUBCUTANEOUS
  Filled 2024-06-18 (×12): qty 1

## 2024-06-18 MED ORDER — FUROSEMIDE 10 MG/ML IJ SOLN
20.0000 mg | Freq: Once | INTRAMUSCULAR | Status: AC
  Administered 2024-06-18: 20 mg via INTRAVENOUS
  Filled 2024-06-18: qty 2

## 2024-06-18 MED ORDER — INSULIN ASPART 100 UNIT/ML IJ SOLN
0.0000 [IU] | Freq: Three times a day (TID) | INTRAMUSCULAR | Status: DC
  Administered 2024-06-18: 1 [IU] via SUBCUTANEOUS

## 2024-06-18 MED ORDER — ALTEPLASE 2 MG IJ SOLR
2.0000 mg | Freq: Once | INTRAMUSCULAR | Status: DC
  Filled 2024-06-18: qty 2

## 2024-06-18 MED ORDER — TRAVASOL 10 % IV SOLN
INTRAVENOUS | Status: AC
  Filled 2024-06-18: qty 848.4

## 2024-06-18 NOTE — Progress Notes (Signed)
 [facility] Progress Note  2 Days Post-Op  Subjective: CC:  Reports a large bout of flatus this morning on the bed pan. Reports feeling bloated a little yesterday, improved with large passage of gas this morning. Denies nausea or vomiting. No BM. Says he does not like IS, he does breathing exercises on his own.    Objective: Vital signs in last 24 hours: Temp:  [98.5 F (36.9 C)-99.3 F (37.4 C)] 98.7 F (37.1 C) (09/21 0920) Pulse Rate:  [58-68] 68 (09/21 0400) Resp:  [17-28] 17 (09/21 0400) BP: (88-110)/(54-65) 109/54 (09/21 0400) SpO2:  [89 %-97 %] 96 % (09/21 0400) Arterial Line BP: (123-139)/(49-62) 123/61 (09/20 1200) Weight:  [71.2 kg] 71.2 kg (09/21 0500) Last BM Date :  (PTA)  Intake/Output from previous day: 09/20 0701 - 09/21 0700 In: 1540.6 [I.V.:1540.6] Out: 575 [Urine:575] Intake/Output this shift: No intake/output data recorded.  PE: Gen:  Alert, NAD, pleasant Card: irregular, HR 90's Pulm:  Normal effort on nasal cannula Abd: Soft, mild to moderate upper abdominal distention with tympany over his soft ventral hernia, Lap incisions c/d/I with dermabond.  NG in place clamped. GU: foley draining clear, yellow urine (1600 mL) Skin: warm and dry, no rashes; RUE thrombophlebitis  Psych: A&Ox3   Lab Results:  Recent Labs    06/17/24 0430 06/18/24 0353  WBC 14.9* 19.4*  HGB 9.2* 9.0*  HCT 27.7* 27.3*  PLT 173 194   BMET Recent Labs    06/17/24 0430 06/18/24 0353  NA 134* 134*  K 4.5 4.7  CL 102 102  CO2 25 24  GLUCOSE 127* 112*  BUN 23 25*  CREATININE 0.57* 0.60*  CALCIUM  8.3* 8.4*   PT/INR No results for input(s): LABPROT, INR in the last 72 hours. CMP     Component Value Date/Time   NA 134 (L) 06/18/2024 0353   NA 143 04/23/2022 1454   K 4.7 06/18/2024 0353   CL 102 06/18/2024 0353   CO2 24 06/18/2024 0353   GLUCOSE 112 (H) 06/18/2024 0353   BUN 25 (H) 06/18/2024 0353   BUN 23 04/23/2022 1454   CREATININE 0.60 (L)  06/18/2024 0353   CALCIUM  8.4 (L) 06/18/2024 0353   PROT 5.8 (L) 06/15/2024 0450   ALBUMIN  2.1 (L) 06/15/2024 0450   AST 19 06/15/2024 0450   ALT 18 06/15/2024 0450   ALKPHOS 43 06/15/2024 0450   BILITOT 0.5 06/15/2024 0450   GFRNONAA >60 06/18/2024 0353   GFRAA  08/14/2007 0312    >60        The eGFR has been calculated using the MDRD equation. This calculation has not been validated in all clinical   Lipase     Component Value Date/Time   LIPASE 18 06/05/2024 1241       Studies/Results: DG Chest Port 1 View Result Date: 06/17/2024 CLINICAL DATA:  Cough. EXAM: PORTABLE CHEST 1 VIEW COMPARISON:  06/15/2024. FINDINGS: The heart size and mediastinal contours are stable. There is atherosclerotic calcification of the aorta. There is a small pleural effusion on the left and moderate pleural effusion on the right with associated atelectasis or infiltrate. No pneumothorax is seen. An enteric tube courses over the left upper quadrant and out of the field of view. A right-sided PICC line is stable in position. No acute osseous abnormality. IMPRESSION: 1. Moderate right pleural effusion and small left pleural effusion with associated atelectasis or infiltrate, slightly increased bilaterally. 2. Support apparatus as described above. Electronically Signed   By: Leita  Waddell M.D.   On: 06/17/2024 17:31    Anti-infectives: Anti-infectives (From admission, onward)    Start     Dose/Rate Route Frequency Ordered Stop   06/11/24 0915  piperacillin -tazobactam (ZOSYN ) IVPB 3.375 g        3.375 g 12.5 mL/hr over 240 Minutes Intravenous Every 8 hours 06/11/24 0820 06/16/24 0118   06/11/24 0800  cefTRIAXone (ROCEPHIN) 1 g in sodium chloride  0.9 % 100 mL IVPB  Status:  Discontinued        1 g 200 mL/hr over 30 Minutes Intravenous Every 24 hours 06/11/24 0642 06/11/24 0710   06/11/24 0800  cefTRIAXone (ROCEPHIN) 2 g in sodium chloride  0.9 % 100 mL IVPB  Status:  Discontinued        2 g 200 mL/hr  over 30 Minutes Intravenous Every 24 hours 06/11/24 0710 06/11/24 0815        Assessment/Plan  SBO, admitted 9/8 and failed non-operative measures, ultimately required surgery 9/19 as below POD#2 s/p diagnostic laparoscopy, laparoscopic LOA 45 minutes  - afebrile, VSS, hemodynamically stable  - WBC 15 > 14 > 19.4 today. Low suspicion for intra-abdominal source of infection at this time, exam is benign and he has started having flatus. Monitor.  -  Hgb overall stable at 9.0 from 9.2 - patient has minimal flatus and remains moderately distended, trial CLD around NG tube before pulling NG. Discussed bedside swallow eval with RN Manisha   FEN: NPO, NG clamped, IVF per primary, TPN;  ID: completed abx for aspiration; monitor for evidence of recurrent aspiration given coarse breath sounds and worsening leukocytosis. Noted CXR ordered by primary team, will follow.  Foley: removed POD#1, spont voids Dispo: Progressive care  Recommend heat/elevation for thrombophlebitis, consider NSAIDs  Below per primary-- Afib RVR - rate controlled now Aspiration pneumonia AKI HLD Aortic aneurysm  Normocytic anemia     LOS: 13 days   I reviewed Consultant cardiology, palliative notes, hospitalist notes, last 24 h vitals and pain scores, last 48 h intake and output, last 24 h labs and trends, and last 24 h imaging results.  This care required moderate level of medical decision making.   Almarie Pringle, PA-C [facility] Please see Amion for pager number during day hours 7:00am-4:30pm

## 2024-06-18 NOTE — Progress Notes (Signed)
  Progress Note  Patient Name: Gary Rice [facility] Date of Encounter: 06/18/2024 [facility] Cardiologist: Jeffrie  Interval Summary   No specific cardiac complaints.  Has remained in normal rhythm for the last 24 hours.  Passing gas, no bowel movement yet.  Vital Signs Vitals:   06/17/24 2352 06/18/24 0400 06/18/24 0500 06/18/24 0920  BP: (!) 110/55 (!) 109/54    Pulse: 68 68    Resp: 20 17    Temp: 99 F (37.2 C) 98.7 F (37.1 C)  98.7 F (37.1 C)  TempSrc: Oral Oral  Oral  SpO2: 95% 96%    Weight:   71.2 kg   Height:        Intake/Output Summary (Last 24 hours) at 06/18/2024 1052 Last data filed at 06/17/2024 2220 Gross per 24 hour  Intake 1026.96 ml  Output 375 ml  Net 651.96 ml      06/18/2024    5:00 AM 06/17/2024    5:00 AM 06/16/2024    4:37 AM  Last 3 Weights  Weight (lbs) 156 lb 15.5 oz 150 lb 5.7 oz 139 lb 1.8 oz  Weight (kg) 71.2 kg 68.2 kg 63.1 kg      Telemetry/ECG  Sinus rhythm with occasional PACs, sometimes in a pattern of trigeminy- Personally Reviewed  Physical Exam  GEN: No acute distress.   Neck: No JVD Cardiac: RRR, no murmurs, rubs, or gallops.  Respiratory: Clear to auscultation bilaterally. GI: Soft, nontender, non-distended bowel sounds are present. MS: No edema  Assessment & Plan  89 year old man with history of paroxysmal atrial fibrillation and SVT, ascending aortic aneurysm, hyperlipidemia, who has been in the hospital for the last 13 days with small bowel obstruction, complicated by episodes of atrial fibrillation rapid ventricular response and atrial flutter. Had surgery 06/16/2024 with improved clinical status and the following day had resolution of arrhythmia.   Continue intravenous amiodarone  30 mg/hour until he is able to take oral medications.  At that time we will also plan to resume oral anticoagulants unless there are interval bleeding complications.   For questions or updates, please contact [facility] Please consult [email or web address] for contact info under         Signed, Jerel Balding, MD

## 2024-06-18 NOTE — Progress Notes (Signed)
 Physical Therapy Treatment Patient Details Name: Gary Rice MRN: [id number] DOB: Nov 13, 1930 Today's Date: 06/18/2024   History of Present Illness Pt is a 89 y.o. M who presents 06/05/2024 with SBO. 9/9 s/p laparoscopy. Significant PMH:  A-fib on Cardizem  and Eliquis , history of SVT, history of inguinal hernia, history of bowel resection in 2022, inguinal hernia repair 2022.   PT Comments  Pt in bed upon arrival and agreeable to transfer to the recliner. Pt required supervision for bed mobility with use of bed features. Pt wanted to try to stand with no assistance, however, was unable to clear bottom. Pt also declined use of gait belt despite education on the importance for safety. After pt agreed to have assistance, pt required ModAx2 to boost-up and steady with use of RW. Pt had a slight posterior lean requiring assist to steady during step-pivot to the recliner. Agreeable to perform a few seated LE exercises towards end of session. Pt's wife is unable to help physically with pt's friend reporting she is going to be receiving home care services. At this time, recommending <3hrs post acute rehab to work towards independence with mobility. Acute PT to follow.     If plan is discharge home, recommend the following: Assistance with cooking/housework;Assist for transportation;Help with stairs or ramp for entrance;A little help with walking and/or transfers   Can travel by private vehicle     No  Equipment Recommendations  Rollator (4 wheels);Other (comment) (shower chair)       Precautions / Restrictions Precautions Precautions: Fall Recall of Precautions/Restrictions: Intact Precaution/Restrictions Comments: watch HR & SpO2, NG tube, [place] O2, TPN Restrictions Weight Bearing Restrictions Per Provider Order: No     Mobility  Bed Mobility Overal bed mobility: Needs Assistance Bed Mobility: Supine to Sit Rolling: Supervision, Used rails   Supine to sit: Supervision, HOB elevated, Used  rails    General bed mobility comments: supervision for safety with use of bed features, did not want close guarding or physical assist    Transfers Overall transfer level: Needs assistance Equipment used: Rolling walker (2 wheels) Transfers: Sit to/from Stand, Bed to chair/wheelchair/BSC Sit to Stand: Mod assist, +2 physical assistance, +2 safety/equipment   Step pivot transfers: Min assist, +2 safety/equipment     General transfer comment: ModAx2 to boost-up and steady with slight posterior lean. MinA x2 for safety with line management to perform step-pivot to recliner     Balance Overall balance assessment: Needs assistance Sitting-balance support: No upper extremity supported, Feet supported Sitting balance-Leahy Scale: Good   Postural control: Posterior lean Standing balance support: Bilateral upper extremity supported, During functional activity Standing balance-Leahy Scale: Poor Standing balance comment: slight posterior lean with reliance on UE and external support     Communication Communication Communication: No apparent difficulties  Cognition Arousal: Alert Behavior During Therapy: WFL for tasks assessed/performed   PT - Cognitive impairments: No apparent impairments    Following commands: Intact      Cueing Cueing Techniques: Verbal cues  Exercises General Exercises - Lower Extremity Long Arc Quad: AROM, Both, 10 reps, Seated Hip Flexion/Marching: AROM, Both, 10 reps, Seated    General Comments General comments (skin integrity, edema, etc.): VSS on 2L      Pertinent Vitals/Pain Pain Assessment Pain Assessment: No/denies pain    Home Living Family/patient expects to be discharged to:: Private residence Living Arrangements: Spouse/significant other Available Help at Discharge:  (family dicussing possibly having caregivers coming during day) Type of Home: House Home Access: Stairs to enter  Entrance Stairs-Rails: Right Entrance Stairs-Number of Steps:  5 Alternate Level Stairs-Number of Steps: 17 Home Layout: Two level Home Equipment: Cane - single point;Shower seat;Rolling Environmental consultant (2 wheels);BSC/3in1          PT Goals (current goals can now be found in the care plan section) Acute Rehab PT Goals PT Goal Formulation: With patient Time For Goal Achievement: 07/02/24 Potential to Achieve Goals: Good Progress towards PT goals: Progressing toward goals    Frequency    Min 2X/week           Co-evaluation PT/OT/SLP Co-Evaluation/Treatment: Yes Reason for Co-Treatment: For patient/therapist safety;To address functional/ADL transfers PT goals addressed during session: Mobility/safety with mobility;Balance;Proper use of DME        AM-PAC PT 6 Clicks Mobility   Outcome Measure  Help needed turning from your back to your side while in a flat bed without using bedrails?: A Little Help needed moving from lying on your back to sitting on the side of a flat bed without using bedrails?: A Little Help needed moving to and from a bed to a chair (including a wheelchair)?: Total Help needed standing up from a chair using your arms (e.g., wheelchair or bedside chair)?: Total Help needed to walk in hospital room?: Total Help needed climbing 3-5 steps with a railing? : Total 6 Click Score: 10    End of Session Equipment Utilized During Treatment: Other (comment);Oxygen (pt declined use of gait belt) Activity Tolerance: Patient tolerated treatment well Patient left: in chair;with call bell/phone within reach;with chair alarm set Nurse Communication: Mobility status PT Visit Diagnosis: Unsteadiness on feet (R26.81);Other abnormalities of gait and mobility (R26.89);Muscle weakness (generalized) (M62.81);Difficulty in walking, not elsewhere classified (R26.2)     Time: 8599-8567 PT Time Calculation (min) (ACUTE ONLY): 32 min  Charges:    $Therapeutic Activity: 8-22 mins PT General Charges $$ ACUTE PT VISIT: 1 Visit                     Kate ORN, PT, DPT Secure Chat Preferred  Rehab Office [phone]    Kate BRAVO Wendolyn 06/18/2024, 4:37 PM

## 2024-06-18 NOTE — Plan of Care (Signed)
   Problem: Education: Goal: Knowledge of General Education information will improve Description Including pain rating scale, medication(s)/side effects and non-pharmacologic comfort measures Outcome: Progressing

## 2024-06-18 NOTE — Progress Notes (Signed)
 PROGRESS NOTE                                                                                                                                                                                                             Patient Demographics:    Gary Rice, is a 89 y.o. male, DOB - [birth year], FMW:[id number]  Outpatient Primary MD for the patient is Kip Righter, MD    LOS - 13  Admit date - 06/05/2024    Chief Complaint  Patient presents with   Constipation       Brief Narrative (HPI from H&P)   89 y.o. male with history of A-fib-presented with SBO and A-fib RVR. SBO initially managed with conservative measures with some improvement-A-fib RVR managed with Cardizem /heparin  infusion-unfortunately-on 9/13-developed worsening gastric distention-vomited and aspirated, along with A-fib RVR and hypotension. He was subsequently transferred to the ICU for close monitoring. Started on antibiotics for aspiration pneumonia, critical care, cardiology consulted and general surgery following.  He was transferred to my service on 06/18/2024 on day 13 of his hospital stay.   Subjective:    Gary Rice today has, No headache, No chest pain, No abdominal pain - No Nausea, No new weakness tingling or numbness, no shortness of breath, passing some flatus   Assessment  & Plan :   SBO initially being managed conservatively, general surgery on board finally had laparoscopic surgical intervention on 06/16/2024 - Still getting bowel rest, NG tube in place, TNA for nutrition, is passing some flatus on 06/18/2024.  Defer management of this issue to general surgery.   Chronic A-fib with RVR/hemoptysis/epistaxis: Initially rate controlled with Cardizem /beta-blocker but due to worsening bowel obstruction/aspiration--> converted to sinus rhythm this afternoon. Cardiology following on amiodarone  drip, - Anticoagulation being held secondary to hemoptysis EVM 9/18  in fact he just had surgery.  Will challenge with prophylactic heparin  on 06/18/2024. - Will eventually need AC restarted, once clinical situation stabilizes.   Acute hypoxic respiratory failure secondary to aspiration pneumonia: Likely due to aspiration of vomitus, patient IV Zosyn  on 06/15/2024, still on oxygen. Very weak cough reflex, requested to sit in chair use I-S and flutter valve for pulmonary toiletry.  Monitor closely.    Acute blood loss anemia:  - Came in with normal hemoglobin, was noted to have hemoptysis as well as coffee-ground emesis which was positive  for occult blood - Some perioperative blood loss after laparoscopic abdominal surgery, continue to monitor CBC closely, type screen and monitor.   AKI Hemodynamically mediated Resolved.   Acute hyponatremia:  - Hovering around 130.  He is asymptomatic.  Monitor.  Stable.    HLD Hold statins for now-resume when oral intake stable.   Aortic aneurysm: 4.6 cm.  Needs surveillance.  Defer to PCP.        Condition - Extremely Guarded  Family Communication  : Updated patient's brother Garrel [phone]  in detail on 06/18/2024 he is the primary contact  Code Status : DNR  Consults  : General Surgery, cardiology, PCCM, palliative care  PUD Prophylaxis : PPI   Procedures  :            Disposition Plan  :    Status is: Inpatient   DVT Prophylaxis  : Heparin  added 06/18/2024  Place and maintain sequential compression device Start: 06/14/24 0857    Lab Results  Component Value Date   PLT 194 06/18/2024    Diet :  Diet Order             Diet NPO time specified  Diet effective now                    Inpatient Medications  Scheduled Meds:  alteplase   2 mg Intracatheter Once   Chlorhexidine  Gluconate Cloth  6 each Topical Q0600   Influenza vac split trivalent PF  0.5 mL Intramuscular Tomorrow-1000   insulin  aspart  0-9 Units Subcutaneous Q6H   ipratropium  2 spray Each Nare BID   pantoprazole   (PROTONIX ) IV  40 mg Intravenous Q12H   sodium chloride  flush  10-40 mL Intracatheter Q12H   sodium chloride  flush  3 mL Intravenous Q12H   Continuous Infusions:  amiodarone  30 mg/hr (06/18/24 0425)   TPN ADULT (ION) 70 mL/hr at 06/17/24 2102   PRN Meds:.hydrALAZINE , HYDROmorphone  (DILAUDID ) injection, lip balm, [DISCONTINUED] ondansetron  **OR** ondansetron  (ZOFRAN ) IV, mouth rinse, prochlorperazine , sodium chloride  flush    Objective:   Vitals:   06/17/24 1949 06/17/24 2352 06/18/24 0400 06/18/24 0500  BP:  (!) 110/55 (!) 109/54   Pulse:  68 68   Resp:  20 17   Temp: 99.3 F (37.4 C) 99 F (37.2 C) 98.7 F (37.1 C)   TempSrc: Oral Oral Oral   SpO2:  95% 96%   Weight:    71.2 kg  Height:        Wt Readings from Last 3 Encounters:  06/18/24 71.2 kg  01/13/24 65.8 kg  01/11/24 65.3 kg     Intake/Output Summary (Last 24 hours) at 06/18/2024 0901 Last data filed at 06/17/2024 2220 Gross per 24 hour  Intake 1233.02 ml  Output 375 ml  Net 858.02 ml     Physical Exam  Awake Alert, No new F.N deficits, Normal affect [place].AT,PERRAL Supple Neck, No JVD,   Symmetrical Chest wall movement, Good air movement bilaterally, CTAB RRR,No Gallops,Rubs or new Murmurs,  +ve B.Sounds, Abd Soft, No tenderness,   No Cyanosis, Clubbing or edema     RN pressure injury documentation: Wound 06/11/24 0740 Pressure Injury Buttocks Medial Deep Tissue Pressure Injury - Purple or maroon localized area of discolored intact skin or blood-filled blister due to damage of underlying soft tissue from pressure and/or shear. (Active)      Data Review:    Recent Labs  Lab 06/15/24 0255 06/15/24 1639 06/16/24 0435 06/17/24 0430 06/18/24 9646  WBC 15.9* 18.0* 15.2* 14.9* 19.4*  HGB 11.8* 11.7* 10.0* 9.2* 9.0*  HCT 35.9* 34.4* 28.6* 27.7* 27.3*  PLT 184 203 163 173 194  MCV 97.8 93.2 94.7 94.2 95.5  MCH 32.2 31.7 33.1 31.3 31.5  MCHC 32.9 34.0 35.0 33.2 33.0  RDW 13.3 13.0 13.3 13.3 13.5   LYMPHSABS  --  1.2 1.2  --   --   MONOABS  --  1.6* 1.5*  --   --   EOSABS  --  0.1 0.2  --   --   BASOSABS  --  0.1 0.1  --   --     Recent Labs  Lab 06/12/24 0540 06/12/24 1230 06/13/24 0736 06/14/24 0430 06/15/24 0255 06/15/24 0450 06/16/24 0530 06/17/24 0430 06/18/24 0353  NA 134*  --  132* 130*  --  132* 134* 134* 134*  K 3.4*  --  3.6 4.3  --  4.2 4.1 4.5 4.7  CL 99  --  95* 100  --  96* 102 102 102  CO2 27  --  23 24  --  24 25 25 24   ANIONGAP 8  --  14 6  --  12 7 7 8   GLUCOSE 128*  --  295* 147*  --  194* 129* 127* 112*  BUN 19  --  14 13  --  14 25* 23 25*  CREATININE 0.85  --  0.64 0.57*  --  0.59* 0.86 0.57* 0.60*  AST 14*  --  15 16  --  19  --   --   --   ALT 12  --  13 14  --  18  --   --   --   ALKPHOS 37*  --  37* 41  --  43  --   --   --   BILITOT 0.3  --  0.5 0.5  --  0.5  --   --   --   ALBUMIN  2.4*  --  2.6* 2.5*  --  2.1*  --   --   --   INR  --  1.2  --   --   --   --   --   --   --   BNP  --   --   --   --  949.3*  --   --   --   --   MG 1.8  --  1.9 1.9  --  1.8  --  2.0 2.1  PHOS 2.1*  --  2.2* 2.6  --  2.5  --  2.8 2.9  CALCIUM  8.4*  --  7.8* 8.1*  --  8.0* 8.2* 8.3* 8.4*      Recent Labs  Lab 06/12/24 1230 06/13/24 0736 06/14/24 0430 06/15/24 0255 06/15/24 0450 06/16/24 0530 06/17/24 0430 06/18/24 0353  INR 1.2  --   --   --   --   --   --   --   BNP  --   --   --  949.3*  --   --   --   --   MG  --  1.9 1.9  --  1.8  --  2.0 2.1  CALCIUM   --  7.8* 8.1*  --  8.0* 8.2* 8.3* 8.4*    --------------------------------------------------------------------------------------------------------------- Lab Results  Component Value Date   TRIG 45 06/12/2024    Lab Results  Component Value Date   HGBA1C 5.8 (H) 05/11/2021      Micro  Results Recent Results (from the past 240 hours)  MRSA Next Gen by PCR, Nasal     Status: None   Collection Time: 06/11/24  7:43 AM   Specimen: Nasal Mucosa; Nasal Swab  Result Value Ref Range Status    MRSA by PCR Next Gen NOT DETECTED NOT DETECTED Final    Comment: (NOTE) The GeneXpert MRSA Assay (FDA approved for NASAL specimens only), is one component of a comprehensive MRSA colonization surveillance program. It is not intended to diagnose MRSA infection nor to guide or monitor treatment for MRSA infections. Test performance is not FDA approved in patients less than 58 years old. Performed at [facility], [address] [place], [place] [address]     Radiology Report DG Chest Port 1 View Result Date: 06/17/2024 CLINICAL DATA:  Cough. EXAM: PORTABLE CHEST 1 VIEW COMPARISON:  06/15/2024. FINDINGS: The heart size and mediastinal contours are stable. There is atherosclerotic calcification of the aorta. There is a small pleural effusion on the left and moderate pleural effusion on the right with associated atelectasis or infiltrate. No pneumothorax is seen. An enteric tube courses over the left upper quadrant and out of the field of view. A right-sided PICC line is stable in position. No acute osseous abnormality. IMPRESSION: 1. Moderate right pleural effusion and small left pleural effusion with associated atelectasis or infiltrate, slightly increased bilaterally. 2. Support apparatus as described above. Electronically Signed   By: Leita Birmingham M.D.   On: 06/17/2024 17:31     Signature  -   Lavada Stank M.D on 06/18/2024 at 9:01 AM   -  To page go to [email or web address]

## 2024-06-18 NOTE — Progress Notes (Signed)
 PHARMACY - TOTAL PARENTERAL NUTRITION CONSULT NOTE  Indication: Small bowel obstruction  Patient Measurements: Height: (P) 5' 8 (172.7 cm) Weight: 71.2 kg (156 lb 15.5 oz) IBW/kg (Calculated) : (P) 68.4 TPN AdjBW (KG): 64.6 Body mass index is 23.87 kg/m (pended).  Assessment:  45 YOM presented on 9/8 with constipation, found to have a SBO.  PMH significant for inguinal hernia post repair and SBR in 2022.  Patient was started on CLD on 9/11 after NGT removal and then advanced to FLD on 9/12 from which he vomited.  Patient was hypotensive and in Afib on 9/13, requiring transfer to the ICU.  NGT reinserted and diet changed back to NPO due to aspiration.  Patient has inadequate oral intake since admission and Pharmacy consulted to manage TPN for SBO.  He might require surgery and is at risk for refeeding.  Surgery plans on OR this week Thursday or Friday.   Glucose / Insulin : no hx DM - CBGs < 150 (1 unit SSI / 24 hours)  Electrolytes: low Na/Cl (trending up), K 4.7, Phos 2.9, Mag 2.1, CoCa ~9.5 Renal: SCr < 1, BUN up 25 Hepatic: LFTs WNL, alk phos 43, albumin  3.9 on admit now 2.1 Intake / Output; MIVF: NTG output not charted, UOP not charted  GI Imaging: none since TPN initiation GI Surgeries / Procedures: none since TPN initiation  Central access: 06/11/24 TPN start date: 06/11/24  Nutritional Goals: Goal TPN rate is 70 mL/hr provides 85g AA, 235g CHO, 49g ILE and 1627 kCal per day  RD Estimated Needs Total Energy Estimated Needs: 1700-2000kcal/day Total Protein Estimated Needs: 85-100g/day Total Fluid Estimated Needs: 1.7-2.0L/day  Current Nutrition:  NPO, NGT LIWS   Plan:  Continue TPN @ goal rate of 70 mL/hr at 1800  Electrolytes in TPN: increase to Na 120 mEq/L, decrease to K 55 mEq/L, Ca 5 mEq/L, Mg 10 mEq/L, Phos 25 mmol/L, Cl:Ac 2:1 Add standard MVI and trace elements to TPN Continue insulin  sensitive SSI Q8H Thiamine  100mg  IV daily x 5 doses completed Monitor TPN  labs on Mon/Thurs - labs daily while experiencing refeeding   Thank you for allowing pharmacy to be a part of this patient's care.  Shelba Collier, PharmD, BCPS Clinical Pharmacist

## 2024-06-18 NOTE — Evaluation (Signed)
 Occupational Therapy Evaluation Patient Details Name: Gary Rice MRN: [id number] DOB: 11-02-1930 Today's Date: 06/18/2024   History of Present Illness   Pt is a 89 y.o. M who presents 06/05/2024 with SBO. 9/9 s/p laparoscopy. Significant PMH:  A-fib on Cardizem  and Eliquis , history of SVT, history of inguinal hernia, history of bowel resection in 2022, inguinal hernia repair 2022.     Clinical Impressions Pt s/p laparoscopy, laparoscopic LOA 9/19.Patient lives with wife in a multi -level home and is the caregiver for his wife and is Independent in ADLs and IADLs.  Currently patient requires min/mod A x32for sit to stand and functional transfers completed with min A x2 for line mgmt along with max A for LB ADLs such as donning socks.  Patient would benefit from additional OT intervention to address functional deficits listed above in order for patient to return to PLOF  OT recommending <3hrs post acute rehab to work towards independence in order for patient to return to PLOF     If plan is discharge home, recommend the following:   A little help with walking and/or transfers;A lot of help with bathing/dressing/bathroom;Assistance with cooking/housework;Assist for transportation;Help with stairs or ramp for entrance     Functional Status Assessment   Patient has had a recent decline in their functional status and demonstrates the ability to make significant improvements in function in a reasonable and predictable amount of time.     Equipment Recommendations   Other (comment)     Recommendations for Other Services         Precautions/Restrictions   Precautions Precautions: Fall Recall of Precautions/Restrictions: Intact Restrictions Weight Bearing Restrictions Per Provider Order: No     Mobility Bed Mobility Overal bed mobility: Needs Assistance Bed Mobility: Supine to Sit Rolling: Supervision              Transfers Overall transfer level: Needs  assistance Equipment used: Rolling walker (2 wheels) Transfers: Sit to/from Stand Sit to Stand: Min assist, Mod assist, +2 physical assistance     Step pivot transfers: Min assist, +2 safety/equipment            Balance Overall balance assessment: Needs assistance Sitting-balance support: No upper extremity supported, Feet supported       Standing balance support: Bilateral upper extremity supported, During functional activity                               ADL either performed or assessed with clinical judgement   ADL Overall ADL's : Needs assistance/impaired Eating/Feeding: Independent;Bed level   Grooming: Set up;Bed level   Upper Body Bathing: Sitting;Set up   Lower Body Bathing: Moderate assistance   Upper Body Dressing : Set up;Sitting   Lower Body Dressing: Maximal assistance;Bed level   Toilet Transfer: Minimal assistance;Moderate assistance (bed to chair simulation)   Toileting- Clothing Manipulation and Hygiene: Moderate assistance       Functional mobility during ADLs: Cueing for safety;Cueing for sequencing;+2 for safety/equipment;Rolling walker (2 wheels);Minimal assistance       Vision Baseline Vision/History: 1 Wears glasses Patient Visual Report: No change from baseline Vision Assessment?: No apparent visual deficits     Perception         Praxis         Pertinent Vitals/Pain       Extremity/Trunk Assessment Upper Extremity Assessment Upper Extremity Assessment: Generalized weakness   Lower Extremity Assessment Lower Extremity Assessment: Defer to PT  evaluation   Cervical / Trunk Assessment Cervical / Trunk Assessment: Kyphotic   Communication Communication Communication: No apparent difficulties   Cognition Arousal: Alert Behavior During Therapy: WFL for tasks assessed/performed Cognition: No apparent impairments                               Following commands: Intact        Cueing  General Comments   Cueing Techniques: Verbal cues  VSS on 2L   Exercises     Shoulder Instructions      Home Living Family/patient expects to be discharged to:: Private residence Living Arrangements: Spouse/significant other Available Help at Discharge:  (family dicussing possibly having caregivers coming during day) Type of Home: House Home Access: Stairs to enter [employer] of Steps: 5 Entrance Stairs-Rails: Right Home Layout: Two level Alternate Level Stairs-Number of Steps: 17 Alternate Level Stairs-Rails: Right;Left Bathroom Shower/Tub: Walk-in shower;Tub/shower unit   Bathroom Toilet: Handicapped height     Home Equipment: Cane - single point;Shower Counsellor (2 wheels);BSC/3in1          Prior Functioning/Environment Prior Level of Function : Independent/Modified Independent (cane in community)             Mobility Comments: no AD for household, cane for community ambulation ADLs Comments: Pt is Ind with ADLs and IADLs and is the primary caregiver for his wife of 71 years.    OT Problem List: Decreased strength;Decreased activity tolerance;Cardiopulmonary status limiting activity;Decreased safety awareness;Impaired balance (sitting and/or standing)   OT Treatment/Interventions: Self-care/ADL training;Energy conservation;DME and/or AE instruction;Therapeutic activities;Patient/family education;Balance training      OT Goals(Current goals can be found in the care plan section)   Acute Rehab OT Goals OT Goal Formulation: With patient Time For Goal Achievement: 07/02/24 Potential to Achieve Goals: Good   OT Frequency:  Min 2X/week    Co-evaluation   Reason for Co-Treatment: For patient/therapist safety;To address functional/ADL transfers PT goals addressed during session: Mobility/safety with mobility;Balance;Proper use of DME        AM-PAC OT 6 Clicks Daily Activity     Outcome Measure Help from another person  eating meals?: None Help from another person taking care of personal grooming?: A Little Help from another person toileting, which includes using toliet, bedpan, or urinal?: A Lot Help from another person bathing (including washing, rinsing, drying)?: A Lot Help from another person to put on and taking off regular upper body clothing?: A Little Help from another person to put on and taking off regular lower body clothing?: A Lot 6 Click Score: 16   End of Session Equipment Utilized During Treatment: Oxygen;Rolling walker (2 wheels)  Activity Tolerance: Patient tolerated treatment well Patient left: in bed;with call bell/phone within reach;with bed alarm set  OT Visit Diagnosis: Unsteadiness on feet (R26.81);Muscle weakness (generalized) (M62.81)                Time: 8640-8565 OT Time Calculation (min): 35 min Charges:  OT General Charges $OT Visit: 1 Visit OT Evaluation $OT Eval Moderate Complexity: 1 Mod OT Treatments $Self Care/Home Management : 8-22 mins Gary Rice OT/L  Gary Rice 06/18/2024, 4:58 PM

## 2024-06-19 ENCOUNTER — Inpatient Hospital Stay (HOSPITAL_COMMUNITY)

## 2024-06-19 DIAGNOSIS — K56609 Unspecified intestinal obstruction, unspecified as to partial versus complete obstruction: Secondary | ICD-10-CM | POA: Diagnosis not present

## 2024-06-19 DIAGNOSIS — J90 Pleural effusion, not elsewhere classified: Secondary | ICD-10-CM | POA: Diagnosis not present

## 2024-06-19 DIAGNOSIS — R0602 Shortness of breath: Secondary | ICD-10-CM | POA: Diagnosis not present

## 2024-06-19 DIAGNOSIS — R918 Other nonspecific abnormal finding of lung field: Secondary | ICD-10-CM | POA: Diagnosis not present

## 2024-06-19 DIAGNOSIS — J9811 Atelectasis: Secondary | ICD-10-CM | POA: Diagnosis not present

## 2024-06-19 DIAGNOSIS — I480 Paroxysmal atrial fibrillation: Secondary | ICD-10-CM | POA: Diagnosis not present

## 2024-06-19 LAB — COMPREHENSIVE METABOLIC PANEL WITH GFR
ALT: 50 U/L — ABNORMAL HIGH (ref 0–44)
AST: 42 U/L — ABNORMAL HIGH (ref 15–41)
Albumin: 2.1 g/dL — ABNORMAL LOW (ref 3.5–5.0)
Alkaline Phosphatase: 50 U/L (ref 38–126)
Anion gap: 9 (ref 5–15)
BUN: 22 mg/dL (ref 8–23)
CO2: 24 mmol/L (ref 22–32)
Calcium: 8.3 mg/dL — ABNORMAL LOW (ref 8.9–10.3)
Chloride: 102 mmol/L (ref 98–111)
Creatinine, Ser: 0.52 mg/dL — ABNORMAL LOW (ref 0.61–1.24)
GFR, Estimated: >60 mL/min (ref >60)
Glucose, Bld: 121 mg/dL — ABNORMAL HIGH (ref 70–99)
Potassium: 4.1 mmol/L (ref 3.5–5.1)
Sodium: 135 mmol/L (ref 135–145)
Total Bilirubin: 0.6 mg/dL (ref 0.0–1.2)
Total Protein: 6.2 g/dL — ABNORMAL LOW (ref 6.5–8.1)

## 2024-06-19 LAB — BPAM RBC
Blood Product Expiration Date: 202510022359
ISSUE DATE / TIME: 202509081738
Unit Type and Rh: 6200

## 2024-06-19 LAB — GLUCOSE, CAPILLARY
Glucose-Capillary: 119 mg/dL — ABNORMAL HIGH (ref 70–99)
Glucose-Capillary: 119 mg/dL — ABNORMAL HIGH (ref 70–99)
Glucose-Capillary: 120 mg/dL — ABNORMAL HIGH (ref 70–99)
Glucose-Capillary: 125 mg/dL — ABNORMAL HIGH (ref 70–99)

## 2024-06-19 LAB — TYPE AND SCREEN
ABO/RH(D): A POS
Antibody Screen: NEGATIVE
Unit division: 0

## 2024-06-19 LAB — PROCALCITONIN: Procalcitonin: <0.1 ng/mL

## 2024-06-19 LAB — TRIGLYCERIDES: Triglycerides: 36 mg/dL (ref <150)

## 2024-06-19 LAB — MAGNESIUM: Magnesium: 1.8 mg/dL (ref 1.7–2.4)

## 2024-06-19 LAB — PHOSPHORUS: Phosphorus: 3.1 mg/dL (ref 2.5–4.6)

## 2024-06-19 LAB — C-REACTIVE PROTEIN: CRP: 19.8 mg/dL — ABNORMAL HIGH (ref <1.0)

## 2024-06-19 MED ORDER — ALTEPLASE 2 MG IJ SOLR
2.0000 mg | Freq: Once | INTRAMUSCULAR | Status: AC
  Administered 2024-06-19: 2 mg
  Filled 2024-06-19: qty 2

## 2024-06-19 MED ORDER — SODIUM CHLORIDE 0.9% FLUSH
10.0000 mL | Freq: Two times a day (BID) | INTRAVENOUS | Status: DC
  Administered 2024-06-19 – 2024-06-20 (×4): 10 mL

## 2024-06-19 MED ORDER — SODIUM CHLORIDE 0.9% FLUSH: 10.0000 mL | INTRAVENOUS | Status: DC | PRN

## 2024-06-19 MED ORDER — TRAVASOL 10 % IV SOLN
INTRAVENOUS | Status: AC
  Filled 2024-06-19: qty 848.4

## 2024-06-19 NOTE — Plan of Care (Signed)

## 2024-06-19 NOTE — Progress Notes (Signed)
 Progress Note  3 Days Post-Op  Subjective: Patient reports that he is tolerating CLD. Denies nausea and vomiting. Has not had a bowel movement since surgery. Reports flatus. Denies pain.  ROS  All negative with the exception of above.  Objective: Vital signs in last 24 hours: Temp:  [97.9 F (36.6 C)-98.3 F (36.8 C)] 98.3 F (36.8 C) (09/22 1149) Pulse Rate:  [85-103] 85 (09/22 0400) Resp:  [17-20] 20 (09/22 0400) BP: (104-112)/(63-78) 111/78 (09/22 1149) SpO2:  [93 %-98 %] 98 % (09/22 0400) Weight:  [72.8 kg] 72.8 kg (09/22 0500) Last BM Date : 06/16/24  Intake/Output from previous day: 09/21 0701 - 09/22 0700 In: 1496.3 [I.V.:1496.3] Out: 700 [Urine:700] Intake/Output this shift: No intake/output data recorded.  PE: General: Pleasant male who is laying in bed in NAD. HEENT: Head is normocephalic, atraumatic.  Heart: HR normal Lungs: Respiratory effort nonlabored. On nasal cannula. Abd: Soft, Mild to moderate upper abdominal distention of soft ventral hernia. Incisions are C/D/I. NG is clamped. Skin: Warm and dry Psych: A&Ox3 with an appropriate affect.    Lab Results:  Recent Labs    06/17/24 0430 06/18/24 0353  WBC 14.9* 19.4*  HGB 9.2* 9.0*  HCT 27.7* 27.3*  PLT 173 194   BMET Recent Labs    06/17/24 0430 06/18/24 0353  NA 134* 134*  K 4.5 4.7  CL 102 102  CO2 25 24  GLUCOSE 127* 112*  BUN 23 25*  CREATININE 0.57* 0.60*  CALCIUM  8.3* 8.4*   PT/INR No results for input(s): LABPROT, INR in the last 72 hours. CMP     Component Value Date/Time   NA 134 (L) 06/18/2024 0353   NA 143 04/23/2022 1454   K 4.7 06/18/2024 0353   CL 102 06/18/2024 0353   CO2 24 06/18/2024 0353   GLUCOSE 112 (H) 06/18/2024 0353   BUN 25 (H) 06/18/2024 0353   BUN 23 04/23/2022 1454   CREATININE 0.60 (L) 06/18/2024 0353   CALCIUM  8.4 (L) 06/18/2024 0353   PROT 5.8 (L) 06/15/2024 0450   ALBUMIN  2.1 (L) 06/15/2024 0450   AST 19 06/15/2024 0450   ALT 18  06/15/2024 0450   ALKPHOS 43 06/15/2024 0450   BILITOT 0.5 06/15/2024 0450   GFRNONAA >60 06/18/2024 0353   GFRAA  08/14/2007 0312    >60        The eGFR has been calculated using the MDRD equation. This calculation has not been validated in all clinical   Lipase     Component Value Date/Time   LIPASE 18 06/05/2024 1241       Studies/Results: DG Chest Port 1 View Result Date: 06/19/2024 EXAM: 1 VIEW XRAY OF THE CHEST 06/19/2024 06:06:00 AM COMPARISON: 06/17/2024 CLINICAL HISTORY: SOB (shortness of breath) Sob,SBO; ROVER FINDINGS: LINES, TUBES AND DEVICES: Enteric tube beyond inferior aspect of film. Stable right PICC line. LUNGS AND PLEURA: Persistent small-to-moderate bilateral pleural effusions with veil-like opacification over both lower lung zones. Associated atelectasis noted within both lung bases. HEART AND MEDIASTINUM: Tortuous thoracic aorta. BONES AND SOFT TISSUES: No acute osseous abnormality. IMPRESSION: 1. Persistent small-to-moderate bilateral pleural effusions with associated lower lung zone opacification and basilar atelectasis. 2. Tortuous thoracic aorta. 3. Enteric tube tip not visualized (beyond inferior aspect of film). 4. Stable right PICC line. Electronically signed by: Waddell Calk MD 06/19/2024 06:47 AM EDT RP Workstation: [id number]    Anti-infectives: Anti-infectives (From admission, onward)    Start     Dose/Rate Route Frequency Ordered Stop  06/11/24 0915  piperacillin -tazobactam (ZOSYN ) IVPB 3.375 g        3.375 g 12.5 mL/hr over 240 Minutes Intravenous Every 8 hours 06/11/24 0820 06/16/24 0118   06/11/24 0800  cefTRIAXone (ROCEPHIN) 1 g in sodium chloride  0.9 % 100 mL IVPB  Status:  Discontinued        1 g 200 mL/hr over 30 Minutes Intravenous Every 24 hours 06/11/24 0642 06/11/24 0710   06/11/24 0800  cefTRIAXone (ROCEPHIN) 2 g in sodium chloride  0.9 % 100 mL IVPB  Status:  Discontinued        2 g 200 mL/hr over 30 Minutes Intravenous Every 24  hours 06/11/24 0710 06/11/24 0815        Assessment/Plan SBO, admitted 9/8 and failed non-operative measures, ultimately required surgery 9/19 POD#3 s/p diagnostic laparoscopy, laparoscopic LOA 45 minutes  - Afebrile, VSS, hemodynamically stable  - Labs pending today. Labs from 9/21: WBC 19.4 from 14.9; HGB 9.0 from 9.2. Abdominal exam without significant findings. Continue to monitor abdominal exam and trends. - Patient having flatus. Tolerating CLD on 9/21 and today. Will remove NGT and advance to FLD. Will consider weaning off of TPN. No BM yet.  - Encouraged IS. Noted CXR ordered by primary team. - Will continue to follow   FEN: FLD; NGT to be removed. IVF per primary, TPN. ID: Completed abx for aspiration. Foley: Removed POD#1, spont voids Dispo: Progressive care   Recommend heat/elevation for thrombophlebitis, consider NSAIDs   Below per primary-- Afib RVR - rate controlled now Aspiration pneumonia AKI HLD Aortic aneurysm  Normocytic anemia     LOS: 14 days   I reviewed hospitalist notes,nursing notes, last 24 h vitals and pain scores, last 48 h intake and output, last 24 h labs and trends, and last 24 h imaging results.   Marjorie Carlyon Favre, [facility] 06/19/2024, 12:53 PM Please see Amion for pager number during day hours 7:00am-4:30pm

## 2024-06-19 NOTE — Progress Notes (Addendum)
  Progress Note  Patient Name: Gary Rice [facility] Date of Encounter: 06/19/2024 [facility] Cardiologist: None    Interval Summary   Patient denies palpitations, chest pain, shortness of breath. No abdominal pain. Remains in atrial fibrillation with HR in the 80s-90s   Vital Signs Vitals:   06/19/24 0000 06/19/24 0400 06/19/24 0500 06/19/24 0700  BP: 105/63 104/63  112/74  Pulse: 99 85    Resp: 17 20    Temp: 98 F (36.7 C) 98.2 F (36.8 C)  98 F (36.7 C)  TempSrc: Oral Oral  Oral  SpO2: 93% 98%    Weight:   72.8 kg   Height:        Intake/Output Summary (Last 24 hours) at 06/19/2024 0910 Last data filed at 06/18/2024 1900 Gross per 24 hour  Intake 1496.26 ml  Output 700 ml  Net 796.26 ml      06/19/2024    5:00 AM 06/18/2024    5:00 AM 06/17/2024    5:00 AM  Last 3 Weights  Weight (lbs) 160 lb 7.9 oz 156 lb 15.5 oz 150 lb 5.7 oz  Weight (kg) 72.8 kg 71.2 kg 68.2 kg      Telemetry/ECG  Atrial fibrillation, HR 80-100 - Personally Reviewed  Physical Exam  GEN: No acute distress.  Sitting upright in the bed. NG tube in place  Neck: No JVD Cardiac: Irregular rate and rhythm, no murmurs, rubs, or gallops.  Respiratory: Breathing unlabored  GI: Soft, nontender  MS: No edema in BLE  Assessment & Plan   PAF  - Patient remains in atrial fibrillation. HR well controlled - Continue IV amiodarone , transition to PO once able  - Patient had some perioperative blood loss, hemoptysis, coffee-ground emesis. Hemoglobin down to 9.0 yesterday  - Now on prophylactic heparin , denies bleeding. CBC pending this AM. Will eventually need to get back on DOAC once able    Otherwise per primary  - SBO, failed non-operative measures and underwelt laparoscopy 9/19  - Aspiration pneumonia  - Acute blood loss anemia  - AKI  - Hyponatremia    For questions or updates, please contact [facility] Please consult [email or web address] for contact info under    Signed, Rollo FABIENE Louder, PA-C    I have seen and examined the patient along with Rollo FABIENE Louder, PA-C .  I have reviewed the chart, notes and new data.  I agree with PA/NP's note.  Key new complaints: Unaware of palpitations Key examination changes:  irregular rhythm, no clinical signs of hypervolemia Key new findings / data: On telemetry went back into atrial fibrillation with largely controlled ventricular rates starting around 1600 hrs. yesterday.  Slow steady downward drift of hemoglobin, currently 9.0.  No overt externalized bleeding.  PLAN: Plan to continue intravenous amiodarone  for rhythm/rate control until he is able to eat full diet.  Also plan to resume oral anticoagulants at that time, assuming no new serious bleeding events.  Elizaveta Mattice, MD, FACC [facility] [phone] 06/19/2024, 12:43 PM

## 2024-06-19 NOTE — Care Management Important Message (Signed)
 Important Message  Patient Details  Name: Gary Rice MRN: [id number] Date of Birth: Dec 20, 1930   Important Message Given:  Yes - Medicare IM     Claretta Deed 06/19/2024, 3:53 PM

## 2024-06-19 NOTE — Progress Notes (Signed)
 Patient refused to get NG tube out. MD notified

## 2024-06-19 NOTE — TOC Progression Note (Signed)
 Transition of Care [facility]) - Progression Note    Patient Details  Name: Gary Rice MRN: [id number] Date of Birth: 1931/08/22  Transition of Care [facility]) CM/SW Contact  Inocente GORMAN Kindle, LCSW Phone Number: 06/19/2024, 9:08 AM  Clinical Narrative:    Patient transferred to 5W. CSW following for change to SNF recommendation by therapy. Patient with NG tube.    Expected Discharge Plan: SNF Barriers to Discharge: Continued Medical Work up               Expected Discharge Plan and Services   Discharge Planning Services: CM Consult Post Acute Care Choice: Home Health, Durable Medical Equipment Living arrangements for the past 2 months: Single Family Home                                       Social Drivers of Health (SDOH) Interventions SDOH Screenings   Food Insecurity: No Food Insecurity (06/06/2024)  Housing: Low Risk  (06/06/2024)  Transportation Needs: No Transportation Needs (06/06/2024)  Utilities: Not At Risk (06/06/2024)  Depression (PHQ2-9): Low Risk  (06/18/2021)  Social Connections: Moderately Integrated (06/06/2024)  Tobacco Use: Medium Risk (06/16/2024)    Readmission Risk Interventions     No data to display

## 2024-06-19 NOTE — Progress Notes (Signed)
 PHARMACY - TOTAL PARENTERAL NUTRITION CONSULT NOTE  Indication: Small bowel obstruction  Patient Measurements: Height: (P) 5' 8 (172.7 cm) Weight: 71.2 kg (156 lb 15.5 oz) IBW/kg (Calculated) : (P) 68.4 TPN AdjBW (KG): 64.6 Body mass index is 23.87 kg/m (pended).  Assessment:  77 YOM presented on 9/8 with constipation, found to have a SBO.  PMH significant for inguinal hernia post repair and SBR in 2022.  Patient was started on CLD on 9/11 after NGT removal and then advanced to FLD on 9/12 from which he vomited.  Patient was hypotensive and in Afib on 9/13, requiring transfer to the ICU.  NGT reinserted and diet changed back to NPO due to aspiration.  Patient has inadequate oral intake since admission and Pharmacy consulted to manage TPN for SBO.  He might require surgery and is at risk for refeeding.  Surgery plans on OR this week Thursday or Friday.   Glucose / Insulin : no hx DM - CBGs < 150 (1 unit SSI / 24 hours)  Electrolytes: [Labs not drawn 9/22 as of 10:50] low Na/Cl (trending up), K 4.7, Phos 2.9, Mag 2.1, CoCa ~9.5 Renal: SCr < 1, BUN up 25 Hepatic: LFTs WNL, alk phos 43, albumin  3.9 on admit now 2.1 Intake / Output; MIVF: UOP 0.4 ml/kg/hr, NTG output not charted, LBM 9/19  GI Imaging: none since TPN initiation GI Surgeries / Procedures: none since TPN initiation  Central access: 06/11/24 TPN start date: 06/11/24  Nutritional Goals: Goal TPN rate is 70 mL/hr provides 85g AA, 235g CHO, 49g ILE and 1627 kCal per day  RD Estimated Needs Total Energy Estimated Needs: 1700-2000kcal/day Total Protein Estimated Needs: 85-100g/day Total Fluid Estimated Needs: 1.7-2.0L/day  Current Nutrition:  NPO, NGT LIWS   Plan:  Continue TPN @ goal rate of 70 mL/hr at 1800  Electrolytes in TPN: increase to Na 120 mEq/L, decrease to K 55 mEq/L, Ca 5 mEq/L, Mg 10 mEq/L, Phos 25 mmol/L, Cl:Ac 2:1 [no labs available to address electrolytes 9/22. Ordered repeat labs for AM 9/23] Add standard  MVI and trace elements to TPN Continue insulin  sensitive SSI Q8H Monitor TPN labs on Mon/Thurs - labs daily while experiencing refeeding   Thank you for allowing pharmacy to be a part of this patient's care.   Benedetta Heath BS, PharmD, BCPS Clinical Pharmacist 06/19/2024 7:20 AM  Contact: [phone] after 3 PM

## 2024-06-19 NOTE — Plan of Care (Signed)
   Problem: Education: Goal: Knowledge of General Education information will improve Description Including pain rating scale, medication(s)/side effects and non-pharmacologic comfort measures Outcome: Progressing

## 2024-06-19 NOTE — Progress Notes (Incomplete)
 Nutrition Follow-up  DOCUMENTATION CODES:   Severe malnutrition in context of acute illness/injury  INTERVENTION:  Continue TPN at goal rate Provides 1627 kcals and 85g protein per day MVI w/ trace minerals added Daily weights while on TPN Will monitor for ability to resume enteral nutrition and adequacy of intake Would not recommend discontinuing TPN until pt is meeting >/=60% of minimum estimated nutrition needs via enteral nutrition Initiate calorie count to assess ability to wean nutrition support Add Ensure Plus High Protein po TID, each supplement provides 350 kcal and 20 grams of protein  Add Magic cup TID with meals, each supplement provides 290 kcal and 9 grams of protein  Add MVI w/ minerals  NUTRITION DIAGNOSIS:  Severe Malnutrition related to acute illness as evidenced by severe muscle depletion, moderate fat depletion. - new dx established 2/2 ability to complete NFPE  GOAL:  Patient will meet greater than or equal to 90% of their needs - meeting via TPN  MONITOR:  PO intake, Supplement acceptance, Diet advancement, Weight trends  REASON FOR ASSESSMENT:   Consult New TPN/TNA  ASSESSMENT:   89 y/o male with h/o PAF, HLD, SVT, AAA, dysphagia (followed by outpatient SLP) and bilateral inguinal hernias s/p open right inguinal hernia repair with mesh (05/03/2021) complicated by SBO with ischemic bowel s/p diagnostic laparoscopy with open small bowel resection (19.8cm) with primary anastomosis (05/09/2021) complicated by intra-abdominal abscess and who is now admitted with SBO, aspiration PNA and sepsis.  9/8 - presented to ED, SBO seen on imaging, NPO, NGT placed for decompression 9/9 - SBO protocol initiated 9/11 - contrast seen in colon, NGT clamped and clears started, NGT removed 9/12 - full liquids 9/13 - NGT replaced, output almost immediately, gastric contents positive for blood 9/14 - transferred to ICU, imaging now showing aspiration pneumonia; PICC placed  and TPN started  9/18 - emesis w/ likely aspiration 9/19 - diagnostic laparoscopy  9/21 - advanced to clear liquid diet; NGT clamped 9/22 - advanced to full liquid diet; initiate calorie count  Diet challenge continues. Slowly improving. No reported N/V. NGT remains clamped. Refusing NGT removal until diet sufficiently advanced. Does have mild to moderate bilateral pleural effusions. Relatively stable with no SOB. Requiring 2L O2.   Average Meal Intake  No recent documented intake to review  Spoke with patient at bedside this morning. Not happy that he had to order his own meal. Will modify to room service w/ assist so order will be collected and patient can optimize based on his preferences.Discussed need for supplementation. Pt amicable to Ensure daily. Prefers vanilla flavor. Will enter order when diet advanced to full liquids, which should be imminent. Will also order calorie count to assess ability to wean nutrition support.    Admit weight: 61.2 kg Current weight: 70.9 kg + generalized, non-pitting  Weight trending up this admission. Likely somewhat related to volume received from TPN. Also noted with some edema, however not excessive. Monitoring trend.  Skin stable. Bowels stable and now moving for first time since surgery.   Drains/lines: NGT (gastric): no longer to suction R brachial: PICC, triple lumen  UOP: 1000 ml x24 hours   Medications: SSI 0-9 units q8h, pantoprazole , 2g Mg sulfate x1   Labs:  Na+ 136 K+ 4.2  BUN 20 WBC 19.4>11.6 (H) CRP 19.8>16.7 (H) CBG's 121-122 x24 hours   NUTRITION - FOCUSED PHYSICAL EXAM: As evidenced by muscle and fat depletions, he does meet criteria for severe malnutrition in the context of acute illness.  While he meets criteria for acute illness, it is important to note that he has multiple contributing factors/comorbidities that are chronic in nature.   Flowsheet Row Most Recent Value  Orbital Region Mild depletion  Upper Arm Region  Moderate depletion  Thoracic and Lumbar Region Severe depletion  Buccal Region Moderate depletion  Temple Region Moderate depletion  Clavicle Bone Region Severe depletion  Clavicle and Acromion Bone Region Severe depletion  Scapular Bone Region Severe depletion  Dorsal Hand Moderate depletion  Patellar Region Unable to assess  Anterior Thigh Region Moderate depletion  Posterior Calf Region Severe depletion  Edema (RD Assessment) None  Hair Reviewed  Eyes Reviewed  Mouth Reviewed  Skin Reviewed  Nails Reviewed    Diet Order:   Diet Order             Diet full liquid Room service appropriate? Yes with Assist; Fluid consistency: Thin  Diet effective now             EDUCATION NEEDS:  Education needs have been addressed  Skin:  Skin Assessment: Skin Integrity Issues: Skin Integrity Issues:: DTI DTI: medial buttocks  Last BM:  9/22 - type 6/7 x2  Height:  Ht Readings from Last 1 Encounters:  06/16/24 (P) 5' 8 (1.727 m)   Weight:  Wt Readings from Last 1 Encounters:  06/20/24 70.9 kg   Ideal Body Weight:  70 kg  BMI:  Body mass index is 23.77 kg/m (pended).  Estimated Nutritional Needs:   Kcal:  1700-2000kcal/day  Protein:  85-100g/day  Fluid:  1.7-2.0L/day  Gary Deaner MS, RD, LDN Registered Dietitian Clinical Nutrition RD Inpatient Contact Info in Amion

## 2024-06-19 NOTE — Progress Notes (Signed)
 PROGRESS NOTE                                                                                                                                                                                                             Patient Demographics:    Gary Rice, is a 89 y.o. male, DOB - 06-20-1931, FMW:[id number]  Outpatient Primary MD for the patient is Kip Righter, MD    LOS - 14  Admit date - 06/05/2024    Chief Complaint  Patient presents with   Constipation       Brief Narrative (HPI from H&P)   89 y.o. male with history of A-fib-presented with SBO and A-fib RVR. SBO initially managed with conservative measures with some improvement-A-fib RVR managed with Cardizem /heparin  infusion-unfortunately-on 9/13-developed worsening gastric distention-vomited and aspirated, along with A-fib RVR and hypotension. He was subsequently transferred to the ICU for close monitoring. Started on antibiotics for aspiration pneumonia, critical care, cardiology consulted and general surgery following.  He was transferred to my service on 06/18/2024 on day 13 of his hospital stay.   Subjective:   Patient in bed, appears comfortable, denies any headache, no fever, no chest pain or pressure, no shortness of breath , no abdominal pain. No focal weakness.  He continues to pass flatus but no bowel movements yet, overall feeling some better.   Assessment  & Plan :   SBO initially being managed conservatively, general surgery on board finally had laparoscopic surgical intervention on 06/16/2024 - Still getting bowel rest, NG tube in place but clamped since 06/18/2024, liquid diet challenge started on 06/18/2024 by general surgery, continues to be on TNA for nutrition, is passing some flatus on 06/18/2024.  Defer management of this issue to general surgery.   Chronic A-fib with RVR/hemoptysis/epistaxis: Initially rate controlled with Cardizem /beta-blocker but due to  worsening bowel obstruction/aspiration--> converted to sinus rhythm this afternoon. Cardiology following on amiodarone  drip, - Anticoagulation being held secondary to hemoptysis EVM 9/18 in fact he just had surgery.  Will challenge with prophylactic heparin  on 06/18/2024. - Will eventually need AC restarted, once clinical situation stabilizes.   Acute hypoxic respiratory failure secondary to aspiration pneumonia: Likely due to aspiration of vomitus, patient IV Zosyn  on 06/15/2024, still on oxygen. Very weak cough reflex, requested to sit in chair use I-S and flutter valve for pulmonary toiletry.  Monitor closely.  Does have mild to moderate bilateral pleural effusions but currently relatively stable with no shortness of breath on 2 L nasal cannula oxygen.  Acute blood loss anemia:  - Came in with normal hemoglobin, was noted to have hemoptysis as well as coffee-ground emesis which was positive for occult blood - Some perioperative blood loss after laparoscopic abdominal surgery, continue to monitor CBC closely, type screen and monitor.   AKI Hemodynamically mediated Resolved.   Acute hyponatremia:  - Hovering around 130.  He is asymptomatic.  Monitor.  Stable.    HLD Hold statins for now-resume when oral intake stable.   Aortic aneurysm: 4.6 cm.  Needs surveillance.  Defer to PCP.        Condition - Extremely Guarded  Family Communication  : Updated patient's brother in law Garrel [phone]  in detail on 06/18/2024 he is the primary contact  Code Status : DNR  Consults  : General Surgery, cardiology, PCCM, palliative care  PUD Prophylaxis : PPI   Procedures  :            Disposition Plan  :    Status is: Inpatient   DVT Prophylaxis  : Heparin  added 06/18/2024  heparin  injection 5,000 Units Start: 06/18/24 1130 Place and maintain sequential compression device Start: 06/14/24 0857    Lab Results  Component Value Date   PLT 194 06/18/2024    Diet :  Diet Order              Diet clear liquid Room service appropriate? Yes; Fluid consistency: Thin  Diet effective now                    Inpatient Medications  Scheduled Meds:  alteplase   2 mg Intracatheter Once   Chlorhexidine  Gluconate Cloth  6 each Topical Q0600   heparin  injection (subcutaneous)  5,000 Units Subcutaneous Q8H   Influenza vac split trivalent PF  0.5 mL Intramuscular Tomorrow-1000   insulin  aspart  0-9 Units Subcutaneous Q8H   ipratropium  2 spray Each Nare BID   pantoprazole  (PROTONIX ) IV  40 mg Intravenous Q12H   sodium chloride  flush  10-40 mL Intracatheter Q12H   sodium chloride  flush  10-40 mL Intracatheter Q12H   sodium chloride  flush  3 mL Intravenous Q12H   Continuous Infusions:  amiodarone  30 mg/hr (06/19/24 0453)   TPN ADULT (ION) 70 mL/hr at 06/18/24 1729   PRN Meds:.hydrALAZINE , HYDROmorphone  (DILAUDID ) injection, lip balm, [DISCONTINUED] ondansetron  **OR** ondansetron  (ZOFRAN ) IV, mouth rinse, prochlorperazine , sodium chloride  flush, sodium chloride  flush    Objective:   Vitals:   06/19/24 0000 06/19/24 0400 06/19/24 0500 06/19/24 0700  BP: 105/63 104/63  112/74  Pulse: 99 85    Resp: 17 20    Temp: 98 F (36.7 C) 98.2 F (36.8 C)  98 F (36.7 C)  TempSrc: Oral Oral  Oral  SpO2: 93% 98%    Weight:   72.8 kg   Height:        Wt Readings from Last 3 Encounters:  06/19/24 72.8 kg  01/13/24 65.8 kg  01/11/24 65.3 kg     Intake/Output Summary (Last 24 hours) at 06/19/2024 0956 Last data filed at 06/18/2024 1900 Gross per 24 hour  Intake 1496.26 ml  Output 700 ml  Net 796.26 ml     Physical Exam  Awake Alert, No new F.N deficits, Normal affect [place].AT,PERRAL Supple Neck, No JVD,   Symmetrical Chest wall movement, good air movement bilaterally slightly  decreased at the bases with some coarse crackles RRR,No Gallops,Rubs or new Murmurs,  +ve B.Sounds, Abd Soft, No tenderness,   No Cyanosis, Clubbing or edema     RN pressure injury  documentation: Wound 06/11/24 0740 Pressure Injury Buttocks Medial Deep Tissue Pressure Injury - Purple or maroon localized area of discolored intact skin or blood-filled blister due to damage of underlying soft tissue from pressure and/or shear. (Active)      Data Review:    Recent Labs  Lab 06/15/24 0255 06/15/24 1639 06/16/24 0435 06/17/24 0430 06/18/24 0353  WBC 15.9* 18.0* 15.2* 14.9* 19.4*  HGB 11.8* 11.7* 10.0* 9.2* 9.0*  HCT 35.9* 34.4* 28.6* 27.7* 27.3*  PLT 184 203 163 173 194  MCV 97.8 93.2 94.7 94.2 95.5  MCH 32.2 31.7 33.1 31.3 31.5  MCHC 32.9 34.0 35.0 33.2 33.0  RDW 13.3 13.0 13.3 13.3 13.5  LYMPHSABS  --  1.2 1.2  --   --   MONOABS  --  1.6* 1.5*  --   --   EOSABS  --  0.1 0.2  --   --   BASOSABS  --  0.1 0.1  --   --     Recent Labs  Lab 06/12/24 1230 06/13/24 0736 06/13/24 0736 06/14/24 0430 06/15/24 0255 06/15/24 0450 06/16/24 0530 06/17/24 0430 06/18/24 0353  NA  --  132*   < > 130*  --  132* 134* 134* 134*  K  --  3.6   < > 4.3  --  4.2 4.1 4.5 4.7  CL  --  95*   < > 100  --  96* 102 102 102  CO2  --  23   < > 24  --  24 25 25 24   ANIONGAP  --  14   < > 6  --  12 7 7 8   GLUCOSE  --  295*   < > 147*  --  194* 129* 127* 112*  BUN  --  14   < > 13  --  14 25* 23 25*  CREATININE  --  0.64   < > 0.57*  --  0.59* 0.86 0.57* 0.60*  AST  --  15  --  16  --  19  --   --   --   ALT  --  13  --  14  --  18  --   --   --   ALKPHOS  --  37*  --  41  --  43  --   --   --   BILITOT  --  0.5  --  0.5  --  0.5  --   --   --   ALBUMIN   --  2.6*  --  2.5*  --  2.1*  --   --   --   PROCALCITON  --   --   --   --   --   --   --   --  <0.10  INR 1.2  --   --   --   --   --   --   --   --   BNP  --   --   --   --  949.3*  --   --   --   --   MG  --  1.9  --  1.9  --  1.8  --  2.0 2.1  PHOS  --  2.2*  --  2.6  --  2.5  --  2.8 2.9  CALCIUM   --  7.8*   < > 8.1*  --  8.0* 8.2* 8.3* 8.4*   < > = values in this interval not displayed.      Recent Labs  Lab  06/12/24 1230 06/13/24 0736 06/13/24 0736 06/14/24 0430 06/15/24 0255 06/15/24 0450 06/16/24 0530 06/17/24 0430 06/18/24 0353  PROCALCITON  --   --   --   --   --   --   --   --  <0.10  INR 1.2  --   --   --   --   --   --   --   --   BNP  --   --   --   --  949.3*  --   --   --   --   MG  --  1.9  --  1.9  --  1.8  --  2.0 2.1  CALCIUM   --  7.8*   < > 8.1*  --  8.0* 8.2* 8.3* 8.4*   < > = values in this interval not displayed.    --------------------------------------------------------------------------------------------------------------- Lab Results  Component Value Date   TRIG 45 06/12/2024    Lab Results  Component Value Date   HGBA1C 5.8 (H) 05/11/2021      Micro Results Recent Results (from the past 240 hours)  MRSA Next Gen by PCR, Nasal     Status: None   Collection Time: 06/11/24  7:43 AM   Specimen: Nasal Mucosa; Nasal Swab  Result Value Ref Range Status   MRSA by PCR Next Gen NOT DETECTED NOT DETECTED Final    Comment: (NOTE) The GeneXpert MRSA Assay (FDA approved for NASAL specimens only), is one component of a comprehensive MRSA colonization surveillance program. It is not intended to diagnose MRSA infection nor to guide or monitor treatment for MRSA infections. Test performance is not FDA approved in patients less than 28 years old. Performed at [facility], [address] [place], [place] [address]     Radiology Report DG Chest Port 1 View Result Date: 06/19/2024 EXAM: 1 VIEW XRAY OF THE CHEST 06/19/2024 06:06:00 AM COMPARISON: 06/17/2024 CLINICAL HISTORY: SOB (shortness of breath) Sob,SBO; ROVER FINDINGS: LINES, TUBES AND DEVICES: Enteric tube beyond inferior aspect of film. Stable right PICC line. LUNGS AND PLEURA: Persistent small-to-moderate bilateral pleural effusions with veil-like opacification over both lower lung zones. Associated atelectasis noted within both lung bases. HEART AND MEDIASTINUM: Tortuous thoracic aorta. BONES AND SOFT  TISSUES: No acute osseous abnormality. IMPRESSION: 1. Persistent small-to-moderate bilateral pleural effusions with associated lower lung zone opacification and basilar atelectasis. 2. Tortuous thoracic aorta. 3. Enteric tube tip not visualized (beyond inferior aspect of film). 4. Stable right PICC line. Electronically signed by: Waddell Calk MD 06/19/2024 06:47 AM EDT RP Workstation: [id number]     Signature  -   Lavada Stank M.D on 06/19/2024 at 9:56 AM   -  To page go to [email or web address]

## 2024-06-20 DIAGNOSIS — I480 Paroxysmal atrial fibrillation: Secondary | ICD-10-CM | POA: Diagnosis not present

## 2024-06-20 DIAGNOSIS — E43 Unspecified severe protein-calorie malnutrition: Secondary | ICD-10-CM | POA: Insufficient documentation

## 2024-06-20 DIAGNOSIS — K56609 Unspecified intestinal obstruction, unspecified as to partial versus complete obstruction: Secondary | ICD-10-CM | POA: Diagnosis not present

## 2024-06-20 LAB — CBC WITH DIFFERENTIAL/PLATELET
Abs Immature Granulocytes: 0.12 K/uL — ABNORMAL HIGH (ref 0.00–0.07)
Basophils Absolute: 0.1 K/uL (ref 0.0–0.1)
Basophils Relative: 0 %
Eosinophils Absolute: 0.2 K/uL (ref 0.0–0.5)
Eosinophils Relative: 1 %
HCT: 27.1 % — ABNORMAL LOW (ref 39.0–52.0)
Hemoglobin: 8.9 g/dL — ABNORMAL LOW (ref 13.0–17.0)
Immature Granulocytes: 1 %
Lymphocytes Relative: 10 %
Lymphs Abs: 1.2 K/uL (ref 0.7–4.0)
MCH: 31.3 pg (ref 26.0–34.0)
MCHC: 32.8 g/dL (ref 30.0–36.0)
MCV: 95.4 fL (ref 80.0–100.0)
Monocytes Absolute: 0.7 K/uL (ref 0.1–1.0)
Monocytes Relative: 6 %
Neutro Abs: 9.4 K/uL — ABNORMAL HIGH (ref 1.7–7.7)
Neutrophils Relative %: 82 %
Platelets: 257 K/uL (ref 150–400)
RBC: 2.84 MIL/uL — ABNORMAL LOW (ref 4.22–5.81)
RDW: 13.3 % (ref 11.5–15.5)
WBC: 11.6 K/uL — ABNORMAL HIGH (ref 4.0–10.5)
nRBC: 0 % (ref 0.0–0.2)

## 2024-06-20 LAB — COMPREHENSIVE METABOLIC PANEL WITH GFR
ALT: 92 U/L — ABNORMAL HIGH (ref 0–44)
AST: 86 U/L — ABNORMAL HIGH (ref 15–41)
Albumin: 2 g/dL — ABNORMAL LOW (ref 3.5–5.0)
Alkaline Phosphatase: 55 U/L (ref 38–126)
Anion gap: 7 (ref 5–15)
BUN: 20 mg/dL (ref 8–23)
CO2: 26 mmol/L (ref 22–32)
Calcium: 8.4 mg/dL — ABNORMAL LOW (ref 8.9–10.3)
Chloride: 103 mmol/L (ref 98–111)
Creatinine, Ser: 0.6 mg/dL — ABNORMAL LOW (ref 0.61–1.24)
GFR, Estimated: >60 mL/min (ref >60)
Glucose, Bld: 122 mg/dL — ABNORMAL HIGH (ref 70–99)
Potassium: 4.2 mmol/L (ref 3.5–5.1)
Sodium: 136 mmol/L (ref 135–145)
Total Bilirubin: 0.6 mg/dL (ref 0.0–1.2)
Total Protein: 5.9 g/dL — ABNORMAL LOW (ref 6.5–8.1)

## 2024-06-20 LAB — GLUCOSE, CAPILLARY
Glucose-Capillary: 129 mg/dL — ABNORMAL HIGH (ref 70–99)
Glucose-Capillary: 113 mg/dL — ABNORMAL HIGH (ref 70–99)
Glucose-Capillary: 122 mg/dL — ABNORMAL HIGH (ref 70–99)

## 2024-06-20 LAB — C-REACTIVE PROTEIN: CRP: 16.7 mg/dL — ABNORMAL HIGH (ref <1.0)

## 2024-06-20 LAB — PHOSPHORUS: Phosphorus: 3.2 mg/dL (ref 2.5–4.6)

## 2024-06-20 LAB — MAGNESIUM: Magnesium: 1.8 mg/dL (ref 1.7–2.4)

## 2024-06-20 LAB — PROCALCITONIN: Procalcitonin: <0.1 ng/mL

## 2024-06-20 MED ORDER — ENSURE PLUS HIGH PROTEIN PO LIQD
237.0000 mL | Freq: Two times a day (BID) | ORAL | Status: DC
Start: 2024-06-20 — End: 2024-06-21
  Administered 2024-06-20: 237 mL via ORAL

## 2024-06-20 MED ORDER — MAGNESIUM SULFATE 2 GM/50ML IV SOLN
2.0000 g | Freq: Once | INTRAVENOUS | Status: AC
  Administered 2024-06-20: 2 g via INTRAVENOUS
  Filled 2024-06-20: qty 50

## 2024-06-20 MED ORDER — INSULIN ASPART 100 UNIT/ML IJ SOLN
0.0000 [IU] | Freq: Three times a day (TID) | INTRAMUSCULAR | Status: DC
  Administered 2024-06-21 – 2024-06-22 (×3): 1 [IU] via SUBCUTANEOUS

## 2024-06-20 MED ORDER — MAGNESIUM SULFATE IN D5W 1-5 GM/100ML-% IV SOLN
1.0000 g | Freq: Once | INTRAVENOUS | Status: AC
  Administered 2024-06-20: 1 g via INTRAVENOUS
  Filled 2024-06-20: qty 100

## 2024-06-20 MED ORDER — TRAVASOL 10 % IV SOLN
INTRAVENOUS | Status: AC
  Filled 2024-06-20: qty 484.8

## 2024-06-20 MED ORDER — TRAVASOL 10 % IV SOLN
INTRAVENOUS | Status: DC
  Filled 2024-06-20: qty 848.4

## 2024-06-20 NOTE — Progress Notes (Addendum)
  Progress Note  Patient Name: Gary Rice [facility] Date of Encounter: 06/20/2024 [facility] Cardiologist: None    Interval Summary   Patient continues to deny any chest pain, palpitations, or shortness of breath.   Vital Signs Vitals:   06/20/24 0400 06/20/24 0500 06/20/24 0800 06/20/24 0811  BP: 125/80  (!) 105/54 117/61  Pulse: (!) 101  70 72  Resp: 20  (!) 22 (!) 22  Temp: 98.6 F (37 C)   97.9 F (36.6 C)  TempSrc: Oral   Axillary  SpO2: 100%  96% 96%  Weight:  70.9 kg    Height:        Intake/Output Summary (Last 24 hours) at 06/20/2024 1043 Last data filed at 06/19/2024 2326 Gross per 24 hour  Intake 10 ml  Output 800 ml  Net -790 ml      06/20/2024    5:00 AM 06/19/2024    5:00 AM 06/18/2024    5:00 AM  Last 3 Weights  Weight (lbs) 156 lb 4.9 oz 160 lb 7.9 oz 156 lb 15.5 oz  Weight (kg) 70.9 kg 72.8 kg 71.2 kg      Telemetry/ECG  Review of telemetry reveals intermittent episodes of atrial fibrillation.HR 91-102  Physical Exam  General: Patient is sitting upright in bed, appearing comfortable without acute distress. NG tube is taking out place. Neck: No jugular venous distention observed. Cardiac: Irregular rate and rhythm noted; no murmurs, rubs, or gallops detected. Respiratory: Breathing is unlabored and even. Gastrointestinal: Abdomen is soft and non-tender. Musculoskeletal: No edema present in bilateral lower extremities.  Assessment & Plan   PAF  - Patient remains in intermittent atrial fibrillation. HR well controlled - Continue IV amiodarone , Consider switching to PO when patient no longer requires TPN -  CBC this morning is stable , Hgb 8.9 -  Holding DOAC for now. Will cautiously resume   Otherwise per primary  - SBO, failed non-operative measures and underwelt laparoscopy 9/19  - Aspiration pneumonia  - Acute blood loss anemia  - AKI  - Hyponatremia    For questions or updates, please contact [facility] Please  consult [email or web address] for contact info under   Signed, Drue Grow, MD   I have seen and examined the patient along with Drue Grow, MD .  I have reviewed the chart, notes and new data.  I agree with PA/NP's note.  Key new complaints: He reports feeling stronger every day.  Tolerating fluids and soft solids (pudding).  Had a bowel movement.  Or of changes in rhythm. Key examination changes: Irregular rhythm, clear lungs, no murmurs or gallops.  No peripheral edema.  Positive bowel sounds in all 4 quadrants. Key new findings / data: On telemetry he incessantly shifts between sinus rhythm with frequent PACs and periods of paroxysmal atrial fibrillation with controlled ventricular response.  For the most part heart rates are in the 70s-90s.  Hemoglobin is unchanged at 8.9.  PLAN: Once he is tolerating a full diet we will switch intravenous amiodarone  to p.o. and will start oral anticoagulation with Eliquis , barring any interval problems with bleeding.  Vinal Rosengrant, MD, FACC [facility] [phone] 06/20/2024, 11:32 AM

## 2024-06-20 NOTE — Progress Notes (Signed)
 PROGRESS NOTE                                                                                                                                                                                                             Patient Demographics:    Gary Rice, is a 89 y.o. male, DOB - 11-Jun-1931, FMW:[id number]  Outpatient Primary MD for the patient is Kip Righter, MD    LOS - 15  Admit date - 06/05/2024    Chief Complaint  Patient presents with   Constipation       Brief Narrative (HPI from H&P)   89 y.o. male with history of A-fib-presented with SBO and A-fib RVR. SBO initially managed with conservative measures with some improvement-A-fib RVR managed with Cardizem /heparin  infusion-unfortunately-on 9/13-developed worsening gastric distention-vomited and aspirated, along with A-fib RVR and hypotension. He was subsequently transferred to the ICU for close monitoring. Started on antibiotics for aspiration pneumonia, critical care, cardiology consulted and general surgery following.  He was transferred to my service on 06/18/2024 on day 13 of his hospital stay.   Subjective:   Patient in bed, appears comfortable, denies any headache, no fever, no chest pain or pressure, no shortness of breath , no abdominal pain. No focal weakness.   Assessment  & Plan :   SBO initially being managed conservatively, general surgery on board finally had laparoscopic surgical intervention on 06/16/2024 - Still getting bowel rest, NG tube in place but clamped since 06/18/2024, liquid diet challenge started on 06/18/2024 by general surgery, continues to be on TNA for nutrition, is passing some flatus on 06/18/2024.  Defer management of this issue to general surgery.  He is now passing flatus and having bowel movements.   Chronic A-fib with RVR/hemoptysis/epistaxis: Initially rate controlled with Cardizem /beta-blocker but due to worsening bowel  obstruction/aspiration--> converted to sinus rhythm this afternoon. Cardiology following on amiodarone  drip, - Anticoagulation being held secondary to hemoptysis EVM 9/18 in fact he just had surgery.  Will challenge with prophylactic heparin  on 06/18/2024. - Will eventually need AC restarted, once clinical situation stabilizes.   Acute hypoxic respiratory failure secondary to aspiration pneumonia: Likely due to aspiration of vomitus, patient IV Zosyn  on 06/15/2024, still on oxygen. Very weak cough reflex, requested to sit in chair use I-S and flutter valve for pulmonary toiletry.  Monitor closely.  Does have  mild to moderate bilateral pleural effusions but currently relatively stable with no shortness of breath on 2 L nasal cannula oxygen.  Acute blood loss anemia:  - Came in with normal hemoglobin, was noted to have hemoptysis as well as coffee-ground emesis which was positive for occult blood - Some perioperative blood loss after laparoscopic abdominal surgery, continue to monitor CBC closely, type screen and monitor.   AKI Hemodynamically mediated Resolved.   Acute hyponatremia:  - Hovering around 130.  He is asymptomatic.  Monitor.  Stable.    HLD Hold statins for now-resume when oral intake stable.   Aortic aneurysm: 4.6 cm.  Needs surveillance.  Defer to PCP.        Condition - Extremely Guarded  Family Communication  : Updated patient's brother in law Garrel [phone]  in detail on 06/18/2024 he is the primary contact  Code Status : DNR  Consults  : General Surgery, cardiology, PCCM, palliative care  PUD Prophylaxis : PPI   Procedures  :            Disposition Plan  :    Status is: Inpatient   DVT Prophylaxis  : Heparin  added 06/18/2024  heparin  injection 5,000 Units Start: 06/18/24 1130 Place and maintain sequential compression device Start: 06/14/24 0857    Lab Results  Component Value Date   PLT 257 06/20/2024    Diet :  Diet Order              Diet full liquid Room service appropriate? Yes; Fluid consistency: Thin  Diet effective now                    Inpatient Medications  Scheduled Meds:  Chlorhexidine  Gluconate Cloth  6 each Topical Q0600   feeding supplement  237 mL Oral BID BM   heparin  injection (subcutaneous)  5,000 Units Subcutaneous Q8H   Influenza vac split trivalent PF  0.5 mL Intramuscular Tomorrow-1000   insulin  aspart  0-9 Units Subcutaneous Q8H   ipratropium  2 spray Each Nare BID   pantoprazole  (PROTONIX ) IV  40 mg Intravenous Q12H   sodium chloride  flush  10-40 mL Intracatheter Q12H   sodium chloride  flush  10-40 mL Intracatheter Q12H   sodium chloride  flush  3 mL Intravenous Q12H   Continuous Infusions:  amiodarone  30 mg/hr (06/20/24 0509)   TPN ADULT (ION) 70 mL/hr at 06/19/24 1721   TPN ADULT (ION)     PRN Meds:.hydrALAZINE , HYDROmorphone  (DILAUDID ) injection, lip balm, [DISCONTINUED] ondansetron  **OR** ondansetron  (ZOFRAN ) IV, mouth rinse, prochlorperazine , sodium chloride  flush, sodium chloride  flush    Objective:   Vitals:   06/20/24 0400 06/20/24 0500 06/20/24 0800 06/20/24 0811  BP: 125/80  (!) 105/54 117/61  Pulse: (!) 101  70 72  Resp: 20  (!) 22 (!) 22  Temp: 98.6 F (37 C)   97.9 F (36.6 C)  TempSrc: Oral   Axillary  SpO2: 100%  96% 96%  Weight:  70.9 kg    Height:        Wt Readings from Last 3 Encounters:  06/20/24 70.9 kg  01/13/24 65.8 kg  01/11/24 65.3 kg     Intake/Output Summary (Last 24 hours) at 06/20/2024 1033 Last data filed at 06/19/2024 2326 Gross per 24 hour  Intake 10 ml  Output 800 ml  Net -790 ml     Physical Exam  Awake Alert, No new F.N deficits, Normal affect [place].AT,PERRAL Supple Neck, No JVD,   Symmetrical Chest wall movement,  good air movement bilaterally slightly decreased at the bases with some coarse crackles RRR,No Gallops,Rubs or new Murmurs,  +ve B.Sounds, Abd Soft, No tenderness,   No Cyanosis, Clubbing or edema     RN  pressure injury documentation: Wound 06/11/24 0740 Pressure Injury Buttocks Medial Deep Tissue Pressure Injury - Purple or maroon localized area of discolored intact skin or blood-filled blister due to damage of underlying soft tissue from pressure and/or shear. (Active)      Data Review:    Recent Labs  Lab 06/15/24 1639 06/16/24 0435 06/17/24 0430 06/18/24 0353 06/20/24 0101  WBC 18.0* 15.2* 14.9* 19.4* 11.6*  HGB 11.7* 10.0* 9.2* 9.0* 8.9*  HCT 34.4* 28.6* 27.7* 27.3* 27.1*  PLT 203 163 173 194 257  MCV 93.2 94.7 94.2 95.5 95.4  MCH 31.7 33.1 31.3 31.5 31.3  MCHC 34.0 35.0 33.2 33.0 32.8  RDW 13.0 13.3 13.3 13.5 13.3  LYMPHSABS 1.2 1.2  --   --  1.2  MONOABS 1.6* 1.5*  --   --  0.7  EOSABS 0.1 0.2  --   --  0.2  BASOSABS 0.1 0.1  --   --  0.1    Recent Labs  Lab 06/14/24 0430 06/15/24 0255 06/15/24 0450 06/16/24 0530 06/17/24 0430 06/18/24 0353 06/19/24 1215 06/20/24 0101  NA 130*  --  132* 134* 134* 134* 135 136  K 4.3  --  4.2 4.1 4.5 4.7 4.1 4.2  CL 100  --  96* 102 102 102 102 103  CO2 24  --  24 25 25 24 24 26   ANIONGAP 6  --  12 7 7 8 9 7   GLUCOSE 147*  --  194* 129* 127* 112* 121* 122*  BUN 13  --  14 25* 23 25* 22 20  CREATININE 0.57*  --  0.59* 0.86 0.57* 0.60* 0.52* 0.60*  AST 16  --  19  --   --   --  42* 86*  ALT 14  --  18  --   --   --  50* 92*  ALKPHOS 41  --  43  --   --   --  50 55  BILITOT 0.5  --  0.5  --   --   --  0.6 0.6  ALBUMIN  2.5*  --  2.1*  --   --   --  2.1* 2.0*  CRP  --   --   --   --   --   --  19.8* 16.7*  PROCALCITON  --   --   --   --   --  <0.10 <0.10 <0.10  BNP  --  949.3*  --   --   --   --   --   --   MG 1.9  --  1.8  --  2.0 2.1 1.8 1.8  PHOS 2.6  --  2.5  --  2.8 2.9 3.1 3.2  CALCIUM  8.1*  --  8.0* 8.2* 8.3* 8.4* 8.3* 8.4*      Recent Labs  Lab 06/15/24 0255 06/15/24 0450 06/16/24 0530 06/17/24 0430 06/18/24 0353 06/19/24 1215 06/20/24 0101  CRP  --   --   --   --   --  19.8* 16.7*  PROCALCITON  --   --    --   --  <0.10 <0.10 <0.10  BNP 949.3*  --   --   --   --   --   --   MG  --  1.8  --  2.0 2.1 1.8 1.8  CALCIUM   --  8.0* 8.2* 8.3* 8.4* 8.3* 8.4*    --------------------------------------------------------------------------------------------------------------- Lab Results  Component Value Date   TRIG 36 06/19/2024    Lab Results  Component Value Date   HGBA1C 5.8 (H) 05/11/2021      Micro Results Recent Results (from the past 240 hours)  MRSA Next Gen by PCR, Nasal     Status: None   Collection Time: 06/11/24  7:43 AM   Specimen: Nasal Mucosa; Nasal Swab  Result Value Ref Range Status   MRSA by PCR Next Gen NOT DETECTED NOT DETECTED Final    Comment: (NOTE) The GeneXpert MRSA Assay (FDA approved for NASAL specimens only), is one component of a comprehensive MRSA colonization surveillance program. It is not intended to diagnose MRSA infection nor to guide or monitor treatment for MRSA infections. Test performance is not FDA approved in patients less than 8 years old. Performed at [facility], [address] [place], [place] [address]     Radiology Report DG Chest Port 1 View Result Date: 06/19/2024 EXAM: 1 VIEW XRAY OF THE CHEST 06/19/2024 06:06:00 AM COMPARISON: 06/17/2024 CLINICAL HISTORY: SOB (shortness of breath) Sob,SBO; ROVER FINDINGS: LINES, TUBES AND DEVICES: Enteric tube beyond inferior aspect of film. Stable right PICC line. LUNGS AND PLEURA: Persistent small-to-moderate bilateral pleural effusions with veil-like opacification over both lower lung zones. Associated atelectasis noted within both lung bases. HEART AND MEDIASTINUM: Tortuous thoracic aorta. BONES AND SOFT TISSUES: No acute osseous abnormality. IMPRESSION: 1. Persistent small-to-moderate bilateral pleural effusions with associated lower lung zone opacification and basilar atelectasis. 2. Tortuous thoracic aorta. 3. Enteric tube tip not visualized (beyond inferior aspect of film). 4. Stable right  PICC line. Electronically signed by: Waddell Calk MD 06/19/2024 06:47 AM EDT RP Workstation: [id number]     Signature  -   Lavada Stank M.D on 06/20/2024 at 10:33 AM   -  To page go to [email or web address]

## 2024-06-20 NOTE — Plan of Care (Signed)

## 2024-06-20 NOTE — Progress Notes (Signed)
 Pt A&ox4 throughout shift. Pt still with poor appetite ate about 25% of meals, ensure given to patient but he did not drink yet. OOB to chair for much of the day. Pt demonstrated IS. BM on shift, no nausea reported. Pt in bed, denies needs at this time.

## 2024-06-20 NOTE — Progress Notes (Signed)
 Progress Note  4 Days Post-Op  Subjective: Tolerating FLD with NGT clamped. I removed NGT at bedside. He is having bowel function. Denies significant abdominal pain.   Objective: Vital signs in last 24 hours: Temp:  [97.9 F (36.6 C)-98.6 F (37 C)] 97.9 F (36.6 C) (09/23 0811) Pulse Rate:  [70-101] 72 (09/23 0811) Resp:  [18-22] 22 (09/23 0811) BP: (105-125)/(54-80) 117/61 (09/23 0811) SpO2:  [96 %-100 %] 96 % (09/23 0811) Weight:  [70.9 kg] 70.9 kg (09/23 0500) Last BM Date : 06/19/24  Intake/Output from previous day: 09/22 0701 - 09/23 0700 In: 10 [I.V.:10] Out: 1000 [Urine:1000] Intake/Output this shift: No intake/output data recorded.  PE: General: Pleasant male who is laying in bed in NAD. HEENT: Head is normocephalic, atraumatic.  Heart: HR normal Lungs: Respiratory effort nonlabored. On 4L nasal cannula. Abd: Soft, Mild to moderate upper abdominal distention of soft ventral hernia. Incisions are C/D/I. NG removed by me Skin: Warm and dry Psych: A&Ox3 with an appropriate affect.    Lab Results:  Recent Labs    06/18/24 0353 06/20/24 0101  WBC 19.4* 11.6*  HGB 9.0* 8.9*  HCT 27.3* 27.1*  PLT 194 257   BMET Recent Labs    06/19/24 1215 06/20/24 0101  NA 135 136  K 4.1 4.2  CL 102 103  CO2 24 26  GLUCOSE 121* 122*  BUN 22 20  CREATININE 0.52* 0.60*  CALCIUM  8.3* 8.4*   PT/INR No results for input(s): LABPROT, INR in the last 72 hours. CMP     Component Value Date/Time   NA 136 06/20/2024 0101   NA 143 04/23/2022 1454   K 4.2 06/20/2024 0101   CL 103 06/20/2024 0101   CO2 26 06/20/2024 0101   GLUCOSE 122 (H) 06/20/2024 0101   BUN 20 06/20/2024 0101   BUN 23 04/23/2022 1454   CREATININE 0.60 (L) 06/20/2024 0101   CALCIUM  8.4 (L) 06/20/2024 0101   PROT 5.9 (L) 06/20/2024 0101   ALBUMIN  2.0 (L) 06/20/2024 0101   AST 86 (H) 06/20/2024 0101   ALT 92 (H) 06/20/2024 0101   ALKPHOS 55 06/20/2024 0101   BILITOT 0.6 06/20/2024 0101    GFRNONAA >60 06/20/2024 0101   GFRAA  08/14/2007 0312    >60        The eGFR has been calculated using the MDRD equation. This calculation has not been validated in all clinical   Lipase     Component Value Date/Time   LIPASE 18 06/05/2024 1241       Studies/Results: DG Chest Port 1 View Result Date: 06/19/2024 EXAM: 1 VIEW XRAY OF THE CHEST 06/19/2024 06:06:00 AM COMPARISON: 06/17/2024 CLINICAL HISTORY: SOB (shortness of breath) Sob,SBO; ROVER FINDINGS: LINES, TUBES AND DEVICES: Enteric tube beyond inferior aspect of film. Stable right PICC line. LUNGS AND PLEURA: Persistent small-to-moderate bilateral pleural effusions with veil-like opacification over both lower lung zones. Associated atelectasis noted within both lung bases. HEART AND MEDIASTINUM: Tortuous thoracic aorta. BONES AND SOFT TISSUES: No acute osseous abnormality. IMPRESSION: 1. Persistent small-to-moderate bilateral pleural effusions with associated lower lung zone opacification and basilar atelectasis. 2. Tortuous thoracic aorta. 3. Enteric tube tip not visualized (beyond inferior aspect of film). 4. Stable right PICC line. Electronically signed by: Waddell Calk MD 06/19/2024 06:47 AM EDT RP Workstation: [id number]    Anti-infectives: Anti-infectives (From admission, onward)    Start     Dose/Rate Route Frequency Ordered Stop   06/11/24 0915  piperacillin -tazobactam (ZOSYN ) IVPB 3.375 g  3.375 g 12.5 mL/hr over 240 Minutes Intravenous Every 8 hours 06/11/24 0820 06/16/24 0118   06/11/24 0800  cefTRIAXone (ROCEPHIN) 1 g in sodium chloride  0.9 % 100 mL IVPB  Status:  Discontinued        1 g 200 mL/hr over 30 Minutes Intravenous Every 24 hours 06/11/24 0642 06/11/24 0710   06/11/24 0800  cefTRIAXone (ROCEPHIN) 2 g in sodium chloride  0.9 % 100 mL IVPB  Status:  Discontinued        2 g 200 mL/hr over 30 Minutes Intravenous Every 24 hours 06/11/24 0710 06/11/24 0815        Assessment/Plan SBO, admitted 9/8  and failed non-operative measures, ultimately required surgery 9/19 POD4 s/p diagnostic laparoscopy, laparoscopic LOA 45 minutes  - Afebrile, VSS, hemodynamically stable  - mobilize as able  - removed NGT and advanced from FLD to soft; wean TPN - discussed with pharmacy - Encouraged IS. Wean O2 as able per primary - Will continue to follow   FEN: soft diet, wean TPN ID: Completed abx for aspiration. Foley: Removed POD#1, spont voids Dispo: Progressive care   Recommend heat/elevation for thrombophlebitis, consider NSAIDs   Below per primary-- Afib RVR - rate controlled now Aspiration pneumonia AKI HLD Aortic aneurysm  Normocytic anemia     LOS: 15 days    Burnard JONELLE Louder, [facility] 06/20/2024, 10:53 AM Please see Amion for pager number during day hours 7:00am-4:30pm

## 2024-06-20 NOTE — Progress Notes (Addendum)
 PHARMACY - TOTAL PARENTERAL NUTRITION CONSULT NOTE  Indication: Small bowel obstruction  Patient Measurements: Height: (P) 5' 8 (172.7 cm) Weight: 70.9 kg (156 lb 4.9 oz) IBW/kg (Calculated) : (P) 68.4 TPN AdjBW (KG): 64.6 Body mass index is 23.77 kg/m (pended).  Assessment:  80 YOM presented on 9/8 with constipation, found to have a SBO.  PMH significant for inguinal hernia post repair and SBR in 2022.  Patient was started on CLD on 9/11 after NGT removal and then advanced to FLD on 9/12 from which he vomited.  Patient was hypotensive and in Afib on 9/13, requiring transfer to the ICU.  NGT reinserted and diet changed back to NPO due to aspiration.  Patient has inadequate oral intake since admission and Pharmacy consulted to manage TPN for SBO.  He might require surgery and is at risk for refeeding.  Glucose / Insulin : no hx DM - CBGs < 150 (0 unit SSI / 24 hours)  Electrolytes: Na 136; K 4.2; Ch 103; CO2 26; Ca 8.4 [CoCa 10]; Phos 3.2; Mag 1.8 Renal: SCr < 1, BUN up 20 Hepatic: AST/ALT 86/92, alk phos wnl, Tbili wnl, albumin  2.0 Intake / Output; MIVF: UOP 0.59 ml/kg/hr, NTG output not charted, LBM 9/22 [1+ unmeasured]  GI Imaging: none since TPN initiation GI Surgeries / Procedures: 9/19 diagnostic laparoscopy  Central access: 06/11/24 TPN start date: 06/11/24  Nutritional Goals: Goal TPN rate is 70 mL/hr provides 85g AA, 235g CHO, 49g ILE and 1627 kCal per day  RD Estimated Needs Total Energy Estimated Needs: 1700-2000kcal/day Total Protein Estimated Needs: 85-100g/day Total Fluid Estimated Needs: 1.7-2.0L/day  Current Nutrition:  NPO, NGT LIWS   Plan:  Wean TPN to half-rate of 40 mL/hr at 1800  Electrolytes in TPN: Na 154 mEq/L, K 80 mEq/L, Ca 9 mEq/L, Mg 15 mEq/L, Phos 25 mmol/L, Cl:Ac 2:1  Add standard MVI and trace elements to TPN Continue insulin  sensitive SSI Q8H. Will consider discontinuing if patient continues to require minimal to no coverage and CBG's remain  <180 Mag 2gm iv x1 Monitor TPN labs on Mon/Thurs - labs daily while experiencing refeeding F/up on TPN DC potential on 9/24   Thank you for allowing pharmacy to be a part of this patient's care.   Benedetta Heath BS, PharmD, BCPS Clinical Pharmacist 06/20/2024 6:42 AM  Contact: [phone] after 3 PM

## 2024-06-20 NOTE — Discharge Instructions (Addendum)
 LAPAROSCOPIC SURGERY: POST OP INSTRUCTIONS Always review your discharge instruction sheet given to you by the facility where your surgery was performed. IF YOU HAVE DISABILITY OR FAMILY LEAVE FORMS, YOU MUST BRING THEM TO THE OFFICE FOR PROCESSING.   DO NOT GIVE THEM TO YOUR DOCTOR.  PAIN CONTROL  First take acetaminophen  (Tylenol ) AND/or ibuprofen (Advil) to control your pain after surgery.  Follow directions on package.  Taking acetaminophen  (Tylenol ) and/or ibuprofen (Advil) regularly after surgery will help to control your pain and lower the amount of prescription pain medication you may need.  You should not take more than 3,000 mg (3 grams) of acetaminophen  (Tylenol ) in 24 hours.  You should not take ibuprofen (Advil), aleve, motrin, naprosyn or other NSAIDS if you have a history of stomach ulcers or chronic kidney disease.  A prescription for pain medication may be given to you upon discharge.  Take your pain medication as prescribed, if you still have uncontrolled pain after taking acetaminophen  (Tylenol ) or ibuprofen (Advil). Use ice packs to help control pain. If you need a refill on your pain medication, please contact your pharmacy.  They will contact our office to request authorization. Prescriptions will not be filled after 5pm or on week-ends.  HOME MEDICATIONS Take your usually prescribed medications unless otherwise directed.  DIET You should follow a light diet the first few days after arrival home.  Be sure to include lots of fluids daily. Avoid fatty, fried foods.   CONSTIPATION It is common to experience some constipation after surgery and if you are taking pain medication.  Increasing fluid intake and taking a stool softener (such as Colace) will usually help or prevent this problem from occurring.  A mild laxative (Milk of Magnesia or Miralax ) should be taken according to package instructions if there are no bowel movements after 48 hours.  WOUND/INCISION CARE Most  patients will experience some swelling and bruising in the area of the incisions.  Ice packs will help.  Swelling and bruising can take several days to resolve.  Unless discharge instructions indicate otherwise, follow guidelines below  STERI-STRIPS - you may remove your outer bandages 48 hours after surgery, and you may shower at that time.  You have steri-strips (small skin tapes) in place directly over the incision.  These strips should be left on the skin for 7-10 days.   DERMABOND/SKIN GLUE - you may shower in 24 hours.  The glue will flake off over the next 2-3 weeks. Any sutures or staples will be removed at the office during your follow-up visit.  ACTIVITIES You may resume regular (light) daily activities beginning the next day--such as daily self-care, walking, climbing stairs--gradually increasing activities as tolerated.  You may have sexual intercourse when it is comfortable.  Refrain from any heavy lifting or straining until approved by your doctor. You may drive when you are no longer taking prescription pain medication, you can comfortably wear a seatbelt, and you can safely maneuver your car and apply brakes.  FOLLOW-UP You should see your doctor in the office for a follow-up appointment approximately 2-3 weeks after your surgery.  You should have been given your post-op/follow-up appointment when your surgery was scheduled.  If you did not receive a post-op/follow-up appointment, make sure that you call for this appointment within a day or two after you arrive home to insure a convenient appointment time.   WHEN TO CALL YOUR DOCTOR: Fever over 101.0 Inability to urinate Continued bleeding from incision. Increased pain, redness, or drainage  from the incision. Increasing abdominal pain  The clinic staff is available to answer your questions during regular business hours.  Please don't hesitate to call and ask to speak to one of the nurses for clinical concerns.  If you have a  medical emergency, go to the nearest emergency room or call 911.  A surgeon from [facility] is always on call at the hospital. [address], [place], [place]  [address] ? [address] [address], [place], [place]   [address] [phone] ? [phone] ? FAX [phone] Web site: [email or web address]     Follow with Primary MD Kip Righter, MD in 7 days   Get CBC, CMP, Magnesium , 2 view Chest X ray -  checked next visit with your primary MD or SNF MD    Activity: As tolerated with Full fall precautions use walker/cane & assistance as needed  Disposition SNF    Diet: Heart Healthy Low Carb, check CBGs q. ACHS.   Special Instructions: If you have smoked or chewed Tobacco  in the last 2 yrs please stop smoking, stop any regular Alcohol  and or any Recreational drug use.  On your next visit with your primary care physician please Get Medicines reviewed and adjusted.  Please request your Prim.MD to go over all Hospital Tests and Procedure/Radiological results at the follow up, please get all Hospital records sent to your Prim MD by signing hospital release before you go home.  If you experience worsening of your admission symptoms, develop shortness of breath, life threatening emergency, suicidal or homicidal thoughts you must seek medical attention immediately by calling 911 or calling your MD immediately  if symptoms less severe.  You Must read complete instructions/literature along with all the possible adverse reactions/side effects for all the Medicines you take and that have been prescribed to you. Take any new Medicines after you have completely understood and accpet all the possible adverse reactions/side effects.   Do not drive when taking Pain medications.  Do not take more than prescribed Pain, Sleep and Anxiety Medications  Wear Seat belts while driving.

## 2024-06-21 DIAGNOSIS — K56609 Unspecified intestinal obstruction, unspecified as to partial versus complete obstruction: Secondary | ICD-10-CM | POA: Diagnosis not present

## 2024-06-21 DIAGNOSIS — I480 Paroxysmal atrial fibrillation: Secondary | ICD-10-CM | POA: Diagnosis not present

## 2024-06-21 LAB — CBC WITH DIFFERENTIAL/PLATELET
Abs Immature Granulocytes: 0.1 K/uL — ABNORMAL HIGH (ref 0.00–0.07)
Basophils Absolute: 0.1 K/uL (ref 0.0–0.1)
Basophils Relative: 0 %
Eosinophils Absolute: 0.1 K/uL (ref 0.0–0.5)
Eosinophils Relative: 1 %
HCT: 26.7 % — ABNORMAL LOW (ref 39.0–52.0)
Hemoglobin: 8.8 g/dL — ABNORMAL LOW (ref 13.0–17.0)
Immature Granulocytes: 1 %
Lymphocytes Relative: 12 %
Lymphs Abs: 1.4 K/uL (ref 0.7–4.0)
MCH: 31.3 pg (ref 26.0–34.0)
MCHC: 33 g/dL (ref 30.0–36.0)
MCV: 95 fL (ref 80.0–100.0)
Monocytes Absolute: 0.8 K/uL (ref 0.1–1.0)
Monocytes Relative: 7 %
Neutro Abs: 8.9 K/uL — ABNORMAL HIGH (ref 1.7–7.7)
Neutrophils Relative %: 79 %
Platelets: 278 K/uL (ref 150–400)
RBC: 2.81 MIL/uL — ABNORMAL LOW (ref 4.22–5.81)
RDW: 13.4 % (ref 11.5–15.5)
WBC: 11.4 K/uL — ABNORMAL HIGH (ref 4.0–10.5)
nRBC: 0 % (ref 0.0–0.2)

## 2024-06-21 LAB — COMPREHENSIVE METABOLIC PANEL WITH GFR
ALT: 134 U/L — ABNORMAL HIGH (ref 0–44)
AST: 101 U/L — ABNORMAL HIGH (ref 15–41)
Albumin: 1.9 g/dL — ABNORMAL LOW (ref 3.5–5.0)
Alkaline Phosphatase: 63 U/L (ref 38–126)
Anion gap: 6 (ref 5–15)
BUN: 19 mg/dL (ref 8–23)
CO2: 25 mmol/L (ref 22–32)
Calcium: 8.2 mg/dL — ABNORMAL LOW (ref 8.9–10.3)
Chloride: 102 mmol/L (ref 98–111)
Creatinine, Ser: 0.63 mg/dL (ref 0.61–1.24)
GFR, Estimated: >60 mL/min (ref >60)
Glucose, Bld: 108 mg/dL — ABNORMAL HIGH (ref 70–99)
Potassium: 4.4 mmol/L (ref 3.5–5.1)
Sodium: 133 mmol/L — ABNORMAL LOW (ref 135–145)
Total Bilirubin: 0.3 mg/dL (ref 0.0–1.2)
Total Protein: 6.1 g/dL — ABNORMAL LOW (ref 6.5–8.1)

## 2024-06-21 LAB — GLUCOSE, CAPILLARY
Glucose-Capillary: 107 mg/dL — ABNORMAL HIGH (ref 70–99)
Glucose-Capillary: 121 mg/dL — ABNORMAL HIGH (ref 70–99)
Glucose-Capillary: 110 mg/dL — ABNORMAL HIGH (ref 70–99)
Glucose-Capillary: 127 mg/dL — ABNORMAL HIGH (ref 70–99)

## 2024-06-21 LAB — C-REACTIVE PROTEIN: CRP: 12 mg/dL — ABNORMAL HIGH (ref <1.0)

## 2024-06-21 LAB — PROCALCITONIN: Procalcitonin: <0.1 ng/mL

## 2024-06-21 LAB — PHOSPHORUS: Phosphorus: 3.4 mg/dL (ref 2.5–4.6)

## 2024-06-21 LAB — MAGNESIUM: Magnesium: 1.9 mg/dL (ref 1.7–2.4)

## 2024-06-21 MED ORDER — FUROSEMIDE 10 MG/ML IJ SOLN
40.0000 mg | Freq: Once | INTRAMUSCULAR | Status: AC
  Administered 2024-06-21: 40 mg via INTRAVENOUS
  Filled 2024-06-21: qty 4

## 2024-06-21 MED ORDER — ENSURE PLUS HIGH PROTEIN PO LIQD
237.0000 mL | Freq: Three times a day (TID) | ORAL | Status: DC
  Administered 2024-06-21 – 2024-06-24 (×8): 237 mL via ORAL

## 2024-06-21 MED ORDER — MAGNESIUM SULFATE IN D5W 1-5 GM/100ML-% IV SOLN
1.0000 g | Freq: Once | INTRAVENOUS | Status: AC
  Administered 2024-06-21: 1 g via INTRAVENOUS
  Filled 2024-06-21: qty 100

## 2024-06-21 NOTE — NC FL2 (Signed)
   MEDICAID FL2 LEVEL OF CARE FORM     IDENTIFICATION  Patient Name: Gary Rice [facility] Birthdate: March 09, 1931 Sex: male Admission Date (Current Location): 06/05/2024  [facility] and [place] Number:  Producer, television/film/video and Address:  [facility] [place], [address] [place], [place] [address]      Provider Number: [id number]  Attending Physician Name and Address:  Dennise Lavada POUR, MD  Relative Name and Phone Number:       Current Level of Care: Hospital Recommended Level of Care: Skilled Nursing Facility Prior Approval Number:    Date Approved/Denied:   PASRR Number: [id number]  Discharge Plan: SNF    Current Diagnoses: Patient Active Problem List   Diagnosis Date Noted   Protein-calorie malnutrition, severe 06/20/2024   Palliative care by specialist 06/15/2024   Acute hypoxic respiratory failure (HCC) 06/11/2024   Aspiration pneumonia due to gastric secretions (HCC) 06/11/2024   Hypotension 06/11/2024   Atrial fibrillation with RVR (HCC) 06/11/2024   AKI (acute kidney injury) 06/05/2024   Syncope 02/23/2022   Paroxysmal A-fib (HCC) 02/23/2022   Head injury 02/23/2022   Ileus (HCC) 06/08/2021   Cholecystitis, acute    Acute blood loss anemia    History of supraventricular tachycardia    Hyponatremia    Debility 05/26/2021   Pelvic abscess in male [facility]) 05/14/2021   Malnutrition of moderate degree 05/12/2021   Paroxysmal atrial fibrillation with RVR (HCC) 05/02/2021   Small bowel obstruction (HCC) 05/02/2021   Leukocytosis 05/02/2021   Hyperlipidemia 05/02/2021   Thoracic aortic aneurysm 05/02/2021   Incarcerated hernia     Orientation RESPIRATION BLADDER Height & Weight     Self, Situation, Time, Place  Normal Continent, External catheter Weight: 150 lb 5.7 oz (68.2 kg) Height:  (P) 5' 8 (172.7 cm)  BEHAVIORAL SYMPTOMS/MOOD NEUROLOGICAL BOWEL NUTRITION STATUS      Incontinent Diet (See dc summary)  AMBULATORY STATUS  COMMUNICATION OF NEEDS Skin   Extensive Assist Verbally Other (Comment), Surgical wounds (Deep tissue injury on buttocks; closed incision on abdomen; laparoscopic wounds;)                       Personal Care Assistance Level of Assistance  Bathing, Feeding, Dressing Bathing Assistance: Maximum assistance Feeding assistance: Limited assistance Dressing Assistance: Maximum assistance     Functional Limitations Info  Sight Sight Info: Impaired (glasses)        SPECIAL CARE FACTORS FREQUENCY  PT (By licensed PT), OT (By licensed OT)     PT Frequency: 5x/week OT Frequency: 5x/week            Contractures Contractures Info: Not present    Additional Factors Info  Code Status, Allergies, Insulin  Sliding Scale Code Status Info: DNR Allergies Info: NKA   Insulin  Sliding Scale Info: See dc summary       Current Medications (06/21/2024):  This is the current hospital active medication list Current Facility-Administered Medications  Medication Dose Route Frequency Provider Last Rate Last Admin   amiodarone  (NEXTERONE  PREMIX) 360-4.14 MG/200ML-% (1.8 mg/mL) IV infusion  30 mg/hr Intravenous Continuous Croitoru, Mihai, MD 16.67 mL/hr at 06/21/24 0537 30 mg/hr at 06/21/24 0537   Chlorhexidine  Gluconate Cloth 2 % PADS 6 each  6 each Topical Q0600 Raenelle Donalda HERO, MD   6 each at 06/21/24 0545   feeding supplement (ENSURE PLUS HIGH PROTEIN) liquid 237 mL  237 mL Oral TID BM Singh, Prashant K, MD  heparin  injection 5,000 Units  5,000 Units Subcutaneous Q8H Singh, Prashant K, MD   5,000 Units at 06/21/24 1512   hydrALAZINE  (APRESOLINE ) injection 2 mg  2 mg Intravenous Q6H PRN Samtani, Jai-Gurmukh, MD       HYDROmorphone  (DILAUDID ) injection 0.5-1 mg  0.5-1 mg Intravenous Q2H PRN Rojelio Nest, DO   1 mg at 06/17/24 2153   Influenza vac split trivalent PF (FLUZONE HIGH-DOSE) injection 0.5 mL  0.5 mL Intramuscular Tomorrow-1000 Rojelio Nest, DO       insulin  aspart (novoLOG )  injection 0-9 Units  0-9 Units Subcutaneous TID AC & HS Singh, Prashant K, MD   1 Units at 06/21/24 1317   ipratropium (ATROVENT ) 0.06 % nasal spray 2 spray  2 spray Each Nare BID Augustus Almarie RAMAN, PA-C   2 spray at 06/21/24 1122   lip balm (CARMEX) ointment   Topical PRN Raenelle Donalda HERO, MD   Given at 06/11/24 1753   ondansetron  (ZOFRAN ) injection 4 mg  4 mg Intravenous Q6H PRN Rojelio Nest, DO   4 mg at 06/10/24 1628   Oral care mouth rinse  15 mL Mouth Rinse PRN Samtani, Jai-Gurmukh, MD       pantoprazole  (PROTONIX ) injection 40 mg  40 mg Intravenous Q12H Sundil, Subrina, MD   40 mg at 06/21/24 1114   prochlorperazine  (COMPAZINE ) injection 5 mg  5 mg Intravenous Q6H PRN Sundil, Subrina, MD       sodium chloride  flush (NS) 0.9 % injection 10-40 mL  10-40 mL Intracatheter Q12H Ghimire, Donalda HERO, MD   10 mL at 06/20/24 2218   sodium chloride  flush (NS) 0.9 % injection 3 mL  3 mL Intravenous Q12H Rojelio Nest, DO   3 mL at 06/21/24 0940   TPN ADULT (ION)   Intravenous Continuous TPN Reome, Earle J, RPH 40 mL/hr at 06/20/24 1724 New Bag at 06/20/24 1724     Discharge Medications: Please see discharge summary for a list of discharge medications.  Relevant Imaging Results:  Relevant Lab Results:   Additional Information SSN [id number] [place], [place]

## 2024-06-21 NOTE — Progress Notes (Signed)
 Physical Therapy Treatment Patient Details Name: Gary Rice MRN: [id number] DOB: 05-06-1931 Today's Date: 06/21/2024   History of Present Illness Pt is a 89 y.o. M who presents 06/05/2024 with SBO. 9/9 s/p laparoscopy. Significant PMH:  A-fib on Cardizem  and Eliquis , history of SVT, history of inguinal hernia, history of bowel resection in 2022, inguinal hernia repair 2022.    PT Comments  Pt with fair tolerance to treatment today. Pt received in chair and requesting to return to bed. Declined ambulation today. CGA/Min A to transfer back to bed with RW. Pt able to perform 5x sit to stand with supervision and increased time. No change in DC/DME recs at this time. PT will continue to follow.     If plan is discharge home, recommend the following: Assistance with cooking/housework;Assist for transportation;Help with stairs or ramp for entrance;A little help with walking and/or transfers   Can travel by private vehicle     No  Equipment Recommendations  Rollator (4 wheels);Other (comment) Public relations account executive)    Recommendations for Other Services       Precautions / Restrictions Precautions Precautions: Fall Recall of Precautions/Restrictions: Intact Precaution/Restrictions Comments: watch HR & SpO2, [place] O2, TPN Restrictions Weight Bearing Restrictions Per Provider Order: No     Mobility  Bed Mobility Overal bed mobility: Needs Assistance Bed Mobility: Sit to Supine       Sit to supine: Min assist   General bed mobility comments: Min A for LLE back into bed.    Transfers Overall transfer level: Needs assistance Equipment used: Rolling walker (2 wheels) Transfers: Sit to/from Stand, Bed to chair/wheelchair/BSC Sit to Stand: Supervision, Contact guard assist   Step pivot transfers: Min assist, +2 safety/equipment       General transfer comment: cues for hand placement. Mostly CGA however briefly requiring Min A to transfer. Able to perform 5x sit to stand with increased  time and supervision.    Ambulation/Gait               General Gait Details: Pt declined   Stairs             Wheelchair Mobility     Tilt Bed    Modified Rankin (Stroke Patients Only)       Balance Overall balance assessment: Needs assistance Sitting-balance support: No upper extremity supported, Feet supported Sitting balance-Leahy Scale: Good     Standing balance support: Bilateral upper extremity supported, During functional activity Standing balance-Leahy Scale: Poor Standing balance comment: slight posterior lean with reliance on UE and external support                            Communication Communication Communication: No apparent difficulties  Cognition Arousal: Alert Behavior During Therapy: WFL for tasks assessed/performed   PT - Cognitive impairments: No apparent impairments                         Following commands: Intact      Cueing Cueing Techniques: Verbal cues  Exercises      General Comments General comments (skin integrity, edema, etc.): SpO2 at 90% on 4L      Pertinent Vitals/Pain Pain Assessment Pain Assessment: Faces Faces Pain Scale: Hurts a little bit Pain Location: knee with exercise Pain Descriptors / Indicators: Discomfort Pain Intervention(s): Monitored during session    Home Living  Prior Function            PT Goals (current goals can now be found in the care plan section) Progress towards PT goals: Progressing toward goals    Frequency    Min 2X/week      PT Plan      Co-evaluation              AM-PAC PT 6 Clicks Mobility   Outcome Measure  Help needed turning from your back to your side while in a flat bed without using bedrails?: A Little Help needed moving from lying on your back to sitting on the side of a flat bed without using bedrails?: A Little Help needed moving to and from a bed to a chair (including a  wheelchair)?: A Little Help needed standing up from a chair using your arms (e.g., wheelchair or bedside chair)?: A Little Help needed to walk in hospital room?: A Lot Help needed climbing 3-5 steps with a railing? : Total 6 Click Score: 15    End of Session Equipment Utilized During Treatment: Gait belt;Oxygen Activity Tolerance: Patient tolerated treatment well Patient left: in bed;with call bell/phone within reach;with bed alarm set Nurse Communication: Mobility status PT Visit Diagnosis: Unsteadiness on feet (R26.81);Other abnormalities of gait and mobility (R26.89);Muscle weakness (generalized) (M62.81);Difficulty in walking, not elsewhere classified (R26.2)     Time: 8486-8464 PT Time Calculation (min) (ACUTE ONLY): 22 min  Charges:    $Therapeutic Activity: 8-22 mins PT General Charges $$ ACUTE PT VISIT: 1 Visit                     Gary Rice, PT, DPT Acute Rehab Services [phone]    Gary Rice 06/21/2024, 4:17 PM

## 2024-06-21 NOTE — TOC Progression Note (Signed)
 Transition of Care [facility]) - Progression Note    Patient Details  Name: Gary Rice MRN: [id number] Date of Birth: [birth year]  [facility]) CM/SW Contact  Inocente GORMAN Kindle, LCSW Phone Number: 06/21/2024, 3:26 PM  Clinical Narrative:    CSW sent out referral for SNFs to review, though patient still on TPN, weaning.    Expected Discharge Plan: Skilled Nursing Facility Barriers to Discharge: Continued Medical Work up, SNF Pending bed offer               Expected Discharge Plan and Services In-house Referral: Clinical Social Work Discharge Planning Services: CM Consult Post Acute Care Choice: Skilled Nursing Facility Living arrangements for the past 2 months: Single Family Home                                       Social Drivers of Health (SDOH) Interventions SDOH Screenings   Food Insecurity: No Food Insecurity (06/06/2024)  Housing: Low Risk  (06/06/2024)  Transportation Needs: No Transportation Needs (06/06/2024)  Utilities: Not At Risk (06/06/2024)  Depression (PHQ2-9): Low Risk  (06/18/2021)  Social Connections: Moderately Integrated (06/06/2024)  Tobacco Use: Medium Risk (06/16/2024)    Readmission Risk Interventions     No data to display

## 2024-06-21 NOTE — Progress Notes (Addendum)
 Progress Note  5 Days Post-Op  Subjective: Tolerating soft diet and having bowel function. Planning on DC to SNF when appropriate.   Objective: Vital signs in last 24 hours: Temp:  [97.4 F (36.3 C)-98.6 F (37 C)] 98 F (36.7 C) (09/24 0806) Pulse Rate:  [56-101] 89 (09/24 0400) Resp:  [19-24] 20 (09/24 0400) BP: (109-127)/(56-77) 113/77 (09/24 0806) SpO2:  [93 %-96 %] 94 % (09/24 0400) Weight:  [68.2 kg] 68.2 kg (09/24 0500) Last BM Date : 06/20/34  Intake/Output from previous day: 09/23 0701 - 09/24 0700 In: 480 [P.O.:480] Out: 1200 [Urine:1200] Intake/Output this shift: No intake/output data recorded.  PE: General: Pleasant male who is laying in bed in NAD. HEENT: Head is normocephalic, atraumatic.  Heart: HR normal Lungs: Respiratory effort nonlabored. On 3L nasal cannula. Abd: Soft, Mild to moderate abdominal distention of soft ventral hernia. Incisions are C/D/I Skin: Warm and dry Psych: A&Ox3 with an appropriate affect.    Lab Results:  Recent Labs    06/20/24 0101 06/21/24 0209  WBC 11.6* 11.4*  HGB 8.9* 8.8*  HCT 27.1* 26.7*  PLT 257 278   BMET Recent Labs    06/20/24 0101 06/21/24 0209  NA 136 133*  K 4.2 4.4  CL 103 102  CO2 26 25  GLUCOSE 122* 108*  BUN 20 19  CREATININE 0.60* 0.63  CALCIUM  8.4* 8.2*   PT/INR No results for input(s): LABPROT, INR in the last 72 hours. CMP     Component Value Date/Time   NA 133 (L) 06/21/2024 0209   NA 143 04/23/2022 1454   K 4.4 06/21/2024 0209   CL 102 06/21/2024 0209   CO2 25 06/21/2024 0209   GLUCOSE 108 (H) 06/21/2024 0209   BUN 19 06/21/2024 0209   BUN 23 04/23/2022 1454   CREATININE 0.63 06/21/2024 0209   CALCIUM  8.2 (L) 06/21/2024 0209   PROT 6.1 (L) 06/21/2024 0209   ALBUMIN  1.9 (L) 06/21/2024 0209   AST 101 (H) 06/21/2024 0209   ALT 134 (H) 06/21/2024 0209   ALKPHOS 63 06/21/2024 0209   BILITOT 0.3 06/21/2024 0209   GFRNONAA >60 06/21/2024 0209   GFRAA  08/14/2007 0312     >60        The eGFR has been calculated using the MDRD equation. This calculation has not been validated in all clinical   Lipase     Component Value Date/Time   LIPASE 18 06/05/2024 1241       Studies/Results: No results found.   Anti-infectives: Anti-infectives (From admission, onward)    Start     Dose/Rate Route Frequency Ordered Stop   06/11/24 0915  piperacillin -tazobactam (ZOSYN ) IVPB 3.375 g        3.375 g 12.5 mL/hr over 240 Minutes Intravenous Every 8 hours 06/11/24 0820 06/16/24 0118   06/11/24 0800  cefTRIAXone (ROCEPHIN) 1 g in sodium chloride  0.9 % 100 mL IVPB  Status:  Discontinued        1 g 200 mL/hr over 30 Minutes Intravenous Every 24 hours 06/11/24 0642 06/11/24 0710   06/11/24 0800  cefTRIAXone (ROCEPHIN) 2 g in sodium chloride  0.9 % 100 mL IVPB  Status:  Discontinued        2 g 200 mL/hr over 30 Minutes Intravenous Every 24 hours 06/11/24 0710 06/11/24 0815        Assessment/Plan SBO, admitted 9/8 and failed non-operative measures, ultimately required surgery 9/19 POD5 s/p diagnostic laparoscopy, laparoscopic LOA 45 minutes  - Afebrile, VSS,  hemodynamically stable  - mobilize as able  - tolerating soft diet, continue 1/2 rate TPN today while patient works to increase PO intake - Encouraged IS. Wean O2 as able per primary - Will continue to follow   FEN: soft diet, 1/2 TPN ID: Completed abx for aspiration. Foley: Removed POD#1, spont voids Dispo: Progressive care   Recommend heat/elevation for thrombophlebitis, consider NSAIDs   Below per primary-- Afib RVR - rate controlled now Aspiration pneumonia AKI HLD Aortic aneurysm  Normocytic anemia     LOS: 16 days    Burnard JONELLE Louder, [facility] 06/21/2024, 9:32 AM Please see Amion for pager number during day hours 7:00am-4:30pm   Addendum: Discussed with primary attending and given concern for fluid overload and pulmonary edema, ok with just stopping TPN after  current bag and monitoring PO intake. Need to encourage PO intake and supplements as able.   Burnard JONELLE Louder, [facility] 06/21/2024, 10:10 AM Please see Amion for pager number during day hours 7:00am-4:30pm

## 2024-06-21 NOTE — Progress Notes (Addendum)
  Progress Note  Patient Name: Gary Rice [facility] Date of Encounter: 06/21/2024 [facility] Cardiologist: None    Interval Summary   Patient continues to deny any chest pain, palpitations, or shortness of breath.   Vital Signs Vitals:   06/21/24 0000 06/21/24 0400 06/21/24 0500 06/21/24 0806  BP: (!) 112/57 127/76  113/77  Pulse: 70 89    Resp: 19 20    Temp: 98.1 F (36.7 C) 98.6 F (37 C)  98 F (36.7 C)  TempSrc: Oral Oral  Oral  SpO2: 96% 94%    Weight:   68.2 kg   Height:        Intake/Output Summary (Last 24 hours) at 06/21/2024 0919 Last data filed at 06/21/2024 0200 Gross per 24 hour  Intake 240 ml  Output 800 ml  Net -560 ml      06/21/2024    5:00 AM 06/20/2024    5:00 AM 06/19/2024    5:00 AM  Last 3 Weights  Weight (lbs) 150 lb 5.7 oz 156 lb 4.9 oz 160 lb 7.9 oz  Weight (kg) 68.2 kg 70.9 kg 72.8 kg      Telemetry/ECG  Review of telemetry reveals intermittent episodes of atrial fibrillation.HR 91-102  Physical Exam  General: Patient is sitting upright in bed, appearing comfortable without acute distress. NG tube is taking out place. Neck: No jugular venous distention observed. Cardiac: Irregular rate and rhythm noted; no murmurs, rubs, or gallops detected. Respiratory: Breathing is unlabored and even. Gastrointestinal: Abdomen is soft and non-tender. Musculoskeletal: No edema present in bilateral lower extremities.  Assessment & Plan   PAF  Assessment: The patient remains in atrial fibrillation without new symptoms or concerns. Telemetry review shows no acute changes. Current medications have been reviewed, and patient concerns addressed. Plan: - Continue IV amiodarone  while the patient is receiving TPN. - Plan to transition to oral amiodarone  once TPN is discontinued by surgery. - Resume home Eliquis  after stopping TPN. - Continue close monitoring of cardiac rhythm and overall clinical status.    For questions or updates, please  contact [facility] Please consult [email or web address] for contact info under   Signed, Drue Grow, MD   I have seen and examined the patient along with Drue Grow, MD .  I have reviewed the chart, notes and new data.  I agree with PA/NP's note.  Key new complaints: Eating full diet. Meatloaf was dry and carrots were overcooked. No CV complaints. Arrhythmia unaware Key examination changes: irregular rhythm. Mostly in NSR overnight and mostly Afib once awake, but always with good rate control. No clinical signs of HF. Key new findings / data: Hgb stable around 9  PLAN: Switch from IV amiodarone  to PO amiodarone . Start Eliquis  5 mg BID if OK with surgical team.  Jerel Balding, MD, FACC [facility] [phone] 06/21/2024, 10:53 AM

## 2024-06-21 NOTE — Progress Notes (Signed)
 Calorie Count Note - Day 1   48 hour calorie count ordered.  Estimated Nutritional Needs:  Kcal:  1700-2000kcal/day Protein:  85-100g/day Fluid:  1.7-2.0L/day  Diet: Soft diet  Supplements: Ensure Plus HP TID, Magic Cup TID   Breakfast: 50% grits, 100% orange juice, 50% choc pudding  Lunch: 25% spaghetti, 25% peach cobbler, 25% green beans, 50% roll  Dinner: Ticket not collected  Supplements: 100% ensure x 1   Total intake: 770 kcal (45% of minimum estimated needs)  28 protein (33% of minimum estimated needs)  Nutrition Dx: Severe Malnutrition related to acute illness as evidenced by severe muscle depletion, moderate fat depletion   Goal: Patient will meet greater than or equal to 90% of their needs   Intervention:  Sof diet  Will monitor for ability to resume enteral nutrition and adequacy of intake 48 hr calorie count to assess ability to wean nutrition support Continue Ensure Plus High Protein po TID, each supplement provides 350 kcal and 20 grams of protein  Continue Magic cup TID with meals, each supplement provides 290 kcal and 9 grams of protein  Continue MVI w/ minerals  Aryn Kops, MS, RD, LDN Clinical Dietitian  Please see AMiON for contact information.

## 2024-06-21 NOTE — Progress Notes (Signed)
 PROGRESS NOTE                                                                                                                                                                                                             Patient Demographics:    Gary Rice, is a 89 y.o. male, DOB - 09/23/1931, FMW:[id number]  Outpatient Primary MD for the patient is Kip Righter, MD    LOS - 16  Admit date - 06/05/2024    Chief Complaint  Patient presents with   Constipation       Brief Narrative (HPI from H&P)   89 y.o. male with history of A-fib-presented with SBO and A-fib RVR. SBO initially managed with conservative measures with some improvement-A-fib RVR managed with Cardizem /heparin  infusion-unfortunately-on 9/13-developed worsening gastric distention-vomited and aspirated, along with A-fib RVR and hypotension. He was subsequently transferred to the ICU for close monitoring. Started on antibiotics for aspiration pneumonia, critical care, cardiology consulted and general surgery following.  He was transferred to my service on 06/18/2024 on day 13 of his hospital stay.   Subjective:   Patient in bed, appears comfortable, denies any headache, no fever, no chest pain or pressure, no shortness of breath , no abdominal pain. No new focal weakness.   Assessment  & Plan :   SBO initially being managed conservatively, general surgery on board finally had laparoscopic surgical intervention on 06/16/2024 - Still getting bowel rest, NG tube initially clamped and finally removed on 06/20/2024, now seems to be tolerating soft diet, having bowel movements, if diet intake remains stable will try and discontinue TNA as soon as possible as he is developing fluid overload.  General surgery on board.     Chronic A-fib with RVR/hemoptysis/epistaxis: Initially rate controlled with Cardizem /beta-blocker but due to worsening bowel obstruction/aspiration--> converted  to sinus rhythm this afternoon. Cardiology following on amiodarone  drip, - Anticoagulation being held secondary to hemoptysis EVM 9/18 in fact he just had surgery.  Will challenge with prophylactic heparin  on 06/18/2024. - Will eventually need AC restarted, once clinical situation stabilizes.   Acute hypoxic respiratory failure secondary to aspiration pneumonia: Likely due to aspiration of vomitus, patient IV Zosyn  on 06/15/2024, still on oxygen.  Mild third spacing of fluid and fluid overload on 06/21/2024, Lasix , try and discontinue TNA soon as possible. Very weak cough reflex, requested to  sit in chair use I-S and flutter valve for pulmonary toiletry.  Monitor closely.  Does have mild to moderate bilateral pleural effusions but currently relatively stable with no shortness of breath on 2 L nasal cannula oxygen.  Acute blood loss anemia:  - Came in with normal hemoglobin, was noted to have hemoptysis as well as coffee-ground emesis which was positive for occult blood - Some perioperative blood loss after laparoscopic abdominal surgery, continue to monitor CBC closely, type screen and monitor.   AKI Hemodynamically mediated Resolved.   Acute hyponatremia:  - Hovering around 130.  He is asymptomatic.  Monitor.  Stable.    HLD Hold statins for now-resume when oral intake stable.   Aortic aneurysm: 4.6 cm.  Needs surveillance.  Defer to PCP.        Condition - Extremely Guarded  Family Communication  : Updated patient's brother in law Garrel [phone]  in detail on 06/18/2024 he is the primary contact  Code Status : DNR  Consults  : General Surgery, cardiology, PCCM, palliative care  PUD Prophylaxis : PPI   Procedures  :            Disposition Plan  :    Status is: Inpatient   DVT Prophylaxis  : Heparin  added 06/18/2024  heparin  injection 5,000 Units Start: 06/18/24 1130 Place and maintain sequential compression device Start: 06/14/24 0857    Lab Results  Component  Value Date   PLT 278 06/21/2024    Diet :  Diet Order             DIET SOFT Room service appropriate? Yes; Fluid consistency: Thin  Diet effective now                    Inpatient Medications  Scheduled Meds:  Chlorhexidine  Gluconate Cloth  6 each Topical Q0600   feeding supplement  237 mL Oral BID BM   furosemide   40 mg Intravenous Once   heparin  injection (subcutaneous)  5,000 Units Subcutaneous Q8H   Influenza vac split trivalent PF  0.5 mL Intramuscular Tomorrow-1000   insulin  aspart  0-9 Units Subcutaneous TID AC & HS   ipratropium  2 spray Each Nare BID   pantoprazole  (PROTONIX ) IV  40 mg Intravenous Q12H   sodium chloride  flush  10-40 mL Intracatheter Q12H   sodium chloride  flush  3 mL Intravenous Q12H   Continuous Infusions:  amiodarone  30 mg/hr (06/21/24 0537)   TPN ADULT (ION) 40 mL/hr at 06/20/24 1724   PRN Meds:.hydrALAZINE , HYDROmorphone  (DILAUDID ) injection, lip balm, [DISCONTINUED] ondansetron  **OR** ondansetron  (ZOFRAN ) IV, mouth rinse, prochlorperazine     Objective:   Vitals:   06/21/24 0000 06/21/24 0400 06/21/24 0500 06/21/24 0806  BP: (!) 112/57 127/76  113/77  Pulse: 70 89    Resp: 19 20    Temp: 98.1 F (36.7 C) 98.6 F (37 C)  98 F (36.7 C)  TempSrc: Oral Oral  Oral  SpO2: 96% 94%    Weight:   68.2 kg   Height:        Wt Readings from Last 3 Encounters:  06/21/24 68.2 kg  01/13/24 65.8 kg  01/11/24 65.3 kg     Intake/Output Summary (Last 24 hours) at 06/21/2024 0919 Last data filed at 06/21/2024 0200 Gross per 24 hour  Intake 240 ml  Output 800 ml  Net -560 ml     Physical Exam  Awake Alert, No new F.N deficits, Normal affect Rice.AT,PERRAL Supple Neck, No JVD,  Symmetrical Chest wall movement, good air movement bilaterally slightly decreased at the bases with some coarse crackles RRR,No Gallops,Rubs or new Murmurs,  +ve B.Sounds, Abd Soft, No tenderness,   No Cyanosis, Clubbing or edema     RN pressure injury  documentation: Wound 06/11/24 0740 Pressure Injury Buttocks Medial Deep Tissue Pressure Injury - Purple or maroon localized area of discolored intact skin or blood-filled blister due to damage of underlying soft tissue from pressure and/or shear. (Active)      Data Review:    Recent Labs  Lab 06/15/24 1639 06/16/24 0435 06/17/24 0430 06/18/24 0353 06/20/24 0101 06/21/24 0209  WBC 18.0* 15.2* 14.9* 19.4* 11.6* 11.4*  HGB 11.7* 10.0* 9.2* 9.0* 8.9* 8.8*  HCT 34.4* 28.6* 27.7* 27.3* 27.1* 26.7*  PLT 203 163 173 194 257 278  MCV 93.2 94.7 94.2 95.5 95.4 95.0  MCH 31.7 33.1 31.3 31.5 31.3 31.3  MCHC 34.0 35.0 33.2 33.0 32.8 33.0  RDW 13.0 13.3 13.3 13.5 13.3 13.4  LYMPHSABS 1.2 1.2  --   --  1.2 1.4  MONOABS 1.6* 1.5*  --   --  0.7 0.8  EOSABS 0.1 0.2  --   --  0.2 0.1  BASOSABS 0.1 0.1  --   --  0.1 0.1    Recent Labs  Lab  0000 06/15/24 0255 06/15/24 0450 06/16/24 0530 06/17/24 0430 06/18/24 0353 06/19/24 1215 06/20/24 0101 06/21/24 0209  NA  --   --  132*   < > 134* 134* 135 136 133*  K  --   --  4.2   < > 4.5 4.7 4.1 4.2 4.4  CL  --   --  96*   < > 102 102 102 103 102  CO2  --   --  24   < > 25 24 24 26 25   ANIONGAP  --   --  12   < > 7 8 9 7 6   GLUCOSE  --   --  194*   < > 127* 112* 121* 122* 108*  BUN  --   --  14   < > 23 25* 22 20 19   CREATININE  --   --  0.59*   < > 0.57* 0.60* 0.52* 0.60* 0.63  AST  --   --  19  --   --   --  42* 86* 101*  ALT  --   --  18  --   --   --  50* 92* 134*  ALKPHOS  --   --  43  --   --   --  50 55 63  BILITOT  --   --  0.5  --   --   --  0.6 0.6 0.3  ALBUMIN   --   --  2.1*  --   --   --  2.1* 2.0* 1.9*  CRP  --   --   --   --   --   --  19.8* 16.7* 12.0*  PROCALCITON  --   --   --   --   --  <0.10 <0.10 <0.10 <0.10  BNP  --  949.3*  --   --   --   --   --   --   --   MG   < >  --  1.8  --  2.0 2.1 1.8 1.8 1.9  PHOS   < >  --  2.5  --  2.8 2.9 3.1 3.2 3.4  CALCIUM   --   --  8.0*   < > 8.3* 8.4* 8.3* 8.4* 8.2*   < > = values  in this interval not displayed.      Recent Labs  Lab 06/15/24 0255 06/15/24 0450 06/17/24 0430 06/18/24 0353 06/19/24 1215 06/20/24 0101 06/21/24 0209  CRP  --   --   --   --  19.8* 16.7* 12.0*  PROCALCITON  --   --   --  <0.10 <0.10 <0.10 <0.10  BNP 949.3*  --   --   --   --   --   --   MG  --    < > 2.0 2.1 1.8 1.8 1.9  CALCIUM   --    < > 8.3* 8.4* 8.3* 8.4* 8.2*   < > = values in this interval not displayed.    --------------------------------------------------------------------------------------------------------------- Lab Results  Component Value Date   TRIG 36 06/19/2024    Lab Results  Component Value Date   HGBA1C 5.8 (H) 05/11/2021      Micro Results No results found for this or any previous visit (from the past 240 hours).   Radiology Report No results found.    Signature  -   Lavada Stank M.D on 06/21/2024 at 9:19 AM   -  To page go to [email or web address]

## 2024-06-21 NOTE — Plan of Care (Signed)

## 2024-06-22 ENCOUNTER — Inpatient Hospital Stay (HOSPITAL_COMMUNITY)

## 2024-06-22 DIAGNOSIS — Z452 Encounter for adjustment and management of vascular access device: Secondary | ICD-10-CM | POA: Diagnosis not present

## 2024-06-22 DIAGNOSIS — K56609 Unspecified intestinal obstruction, unspecified as to partial versus complete obstruction: Secondary | ICD-10-CM | POA: Diagnosis not present

## 2024-06-22 DIAGNOSIS — J90 Pleural effusion, not elsewhere classified: Secondary | ICD-10-CM | POA: Diagnosis not present

## 2024-06-22 DIAGNOSIS — R0602 Shortness of breath: Secondary | ICD-10-CM | POA: Diagnosis not present

## 2024-06-22 LAB — COMPREHENSIVE METABOLIC PANEL WITH GFR
ALT: 113 U/L — ABNORMAL HIGH (ref 0–44)
AST: 68 U/L — ABNORMAL HIGH (ref 15–41)
Albumin: 1.9 g/dL — ABNORMAL LOW (ref 3.5–5.0)
Alkaline Phosphatase: 60 U/L (ref 38–126)
Anion gap: 6 (ref 5–15)
BUN: 21 mg/dL (ref 8–23)
CO2: 25 mmol/L (ref 22–32)
Calcium: 8.2 mg/dL — ABNORMAL LOW (ref 8.9–10.3)
Chloride: 103 mmol/L (ref 98–111)
Creatinine, Ser: 0.77 mg/dL (ref 0.61–1.24)
GFR, Estimated: >60 mL/min (ref >60)
Glucose, Bld: 91 mg/dL (ref 70–99)
Potassium: 3.7 mmol/L (ref 3.5–5.1)
Sodium: 134 mmol/L — ABNORMAL LOW (ref 135–145)
Total Bilirubin: 0.4 mg/dL (ref 0.0–1.2)
Total Protein: 6.2 g/dL — ABNORMAL LOW (ref 6.5–8.1)

## 2024-06-22 LAB — C-REACTIVE PROTEIN: CRP: 8.8 mg/dL — ABNORMAL HIGH (ref <1.0)

## 2024-06-22 LAB — PROCALCITONIN: Procalcitonin: <0.1 ng/mL

## 2024-06-22 LAB — CBC WITH DIFFERENTIAL/PLATELET
Abs Immature Granulocytes: 0.13 K/uL — ABNORMAL HIGH (ref 0.00–0.07)
Basophils Absolute: 0.1 K/uL (ref 0.0–0.1)
Basophils Relative: 0 %
Eosinophils Absolute: 0.1 K/uL (ref 0.0–0.5)
Eosinophils Relative: 1 %
HCT: 27.2 % — ABNORMAL LOW (ref 39.0–52.0)
Hemoglobin: 9 g/dL — ABNORMAL LOW (ref 13.0–17.0)
Immature Granulocytes: 1 %
Lymphocytes Relative: 18 %
Lymphs Abs: 2.2 K/uL (ref 0.7–4.0)
MCH: 30.9 pg (ref 26.0–34.0)
MCHC: 33.1 g/dL (ref 30.0–36.0)
MCV: 93.5 fL (ref 80.0–100.0)
Monocytes Absolute: 1 K/uL (ref 0.1–1.0)
Monocytes Relative: 8 %
Neutro Abs: 8.6 K/uL — ABNORMAL HIGH (ref 1.7–7.7)
Neutrophils Relative %: 72 %
Platelets: 304 K/uL (ref 150–400)
RBC: 2.91 MIL/uL — ABNORMAL LOW (ref 4.22–5.81)
RDW: 13.3 % (ref 11.5–15.5)
WBC: 12 K/uL — ABNORMAL HIGH (ref 4.0–10.5)
nRBC: 0 % (ref 0.0–0.2)

## 2024-06-22 LAB — GLUCOSE, CAPILLARY
Glucose-Capillary: 128 mg/dL — ABNORMAL HIGH (ref 70–99)
Glucose-Capillary: 82 mg/dL (ref 70–99)
Glucose-Capillary: 111 mg/dL — ABNORMAL HIGH (ref 70–99)
Glucose-Capillary: 125 mg/dL — ABNORMAL HIGH (ref 70–99)

## 2024-06-22 MED ORDER — APIXABAN 5 MG PO TABS
5.0000 mg | ORAL_TABLET | Freq: Two times a day (BID) | ORAL | Status: DC
Start: 2024-06-22 — End: 2024-06-24
  Administered 2024-06-22 – 2024-06-24 (×5): 5 mg via ORAL
  Filled 2024-06-22 (×5): qty 1

## 2024-06-22 MED ORDER — MAGNESIUM SULFATE IN D5W 1-5 GM/100ML-% IV SOLN
1.0000 g | Freq: Once | INTRAVENOUS | Status: AC
Start: 2024-06-22 — End: 2024-06-23
  Administered 2024-06-22: 1 g via INTRAVENOUS
  Filled 2024-06-22: qty 100

## 2024-06-22 MED ORDER — FUROSEMIDE 10 MG/ML IJ SOLN
20.0000 mg | Freq: Once | INTRAMUSCULAR | Status: AC
Start: 2024-06-22 — End: 2024-06-22
  Administered 2024-06-22: 20 mg via INTRAVENOUS
  Filled 2024-06-22: qty 2

## 2024-06-22 MED ORDER — PANTOPRAZOLE SODIUM 40 MG PO TBEC
40.0000 mg | DELAYED_RELEASE_TABLET | Freq: Two times a day (BID) | ORAL | Status: DC
  Administered 2024-06-22 – 2024-06-24 (×5): 40 mg via ORAL
  Filled 2024-06-22 (×5): qty 1

## 2024-06-22 MED ORDER — AMIODARONE HCL 200 MG PO TABS
200.0000 mg | ORAL_TABLET | Freq: Every day | ORAL | Status: DC
  Administered 2024-06-22 – 2024-06-24 (×3): 200 mg via ORAL
  Filled 2024-06-22 (×3): qty 1

## 2024-06-22 MED ORDER — POTASSIUM CHLORIDE CRYS ER 20 MEQ PO TBCR
40.0000 meq | EXTENDED_RELEASE_TABLET | Freq: Once | ORAL | Status: AC
Start: 2024-06-22 — End: 2024-06-22
  Administered 2024-06-22: 40 meq via ORAL
  Filled 2024-06-22: qty 2

## 2024-06-22 NOTE — Progress Notes (Signed)
 PROGRESS NOTE                                                                                                                                                                                                             Patient Demographics:    Gary Rice, is a 89 y.o. male, DOB - 1930/10/11, FMW:[id number]  Outpatient Primary MD for the patient is Kip Righter, MD    LOS - 17  Admit date - 06/05/2024    Chief Complaint  Patient presents with   Constipation       Brief Narrative (HPI from H&P)   89 y.o. male with history of A-fib-presented with SBO and A-fib RVR. SBO initially managed with conservative measures with some improvement-A-fib RVR managed with Cardizem /heparin  infusion-unfortunately-on 9/13-developed worsening gastric distention-vomited and aspirated, along with A-fib RVR and hypotension. He was subsequently transferred to the ICU for close monitoring. Started on antibiotics for aspiration pneumonia, critical care, cardiology consulted and general surgery following.  He was transferred to my service on 06/18/2024 on day 13 of his hospital stay.   Subjective:   Patient in bed, appears comfortable, denies any headache, no fever, no chest pain or pressure, no shortness of breath , no abdominal pain. No focal weakness.   Assessment  & Plan :   SBO initially being managed conservatively, general surgery on board finally had laparoscopic surgical intervention on 06/16/2024 - Still getting bowel rest, NG tube initially clamped and finally removed on 06/20/2024, now on oral diet TNA discontinued, having bowel movements passing flatus.  Close to baseline and symptom-free.  General surgery on board.   Chronic A-fib with RVR/hemoptysis/epistaxis: Initially rate controlled with Cardizem /beta-blocker but due to worsening bowel obstruction/aspiration--> converted to sinus rhythm this afternoon. Cardiology following on amiodarone   drip will be switched to oral amiodarone  on 06/22/2024 - Anticoagulation being held secondary to hemoptysis EVM 9/18 in fact he just had surgery.  Initially on heparin  drip now on Eliquis  started on 06/22/2024    Acute hypoxic respiratory failure secondary to aspiration pneumonia: Likely due to aspiration of vomitus, patient IV Zosyn  on 06/15/2024, still on oxygen.  Mild third spacing of fluid and fluid overload on 06/21/2024, Lasix , improved, TNA discontinued repeat low-dose Lasix  on 06/22/2024 Very weak cough reflex, requested to sit in chair use I-S and flutter valve for pulmonary toiletry.  Monitor  closely.  Does have mild to moderate bilateral pleural effusions but currently relatively stable with no shortness of breath on 2 L nasal cannula oxygen.  Acute blood loss anemia:  - Came in with normal hemoglobin, was noted to have hemoptysis as well as coffee-ground emesis which was positive for occult blood - Some perioperative blood loss after laparoscopic abdominal surgery, continue to monitor CBC closely, type screen and monitor.   AKI Hemodynamically mediated Resolved.   Acute hyponatremia:  - Hovering around 130.  He is asymptomatic.  Monitor.  Stable.    HLD Hold statins for now-resume when oral intake stable.   Aortic aneurysm: 4.6 cm.  Needs surveillance.  Defer to PCP.        Condition - Extremely Guarded  Family Communication  : Updated patient's brother in law Garrel [phone]  in detail on 06/18/2024 he is the primary contact  Code Status : DNR  Consults  : General Surgery, cardiology, PCCM, palliative care  PUD Prophylaxis : PPI   Procedures  :            Disposition Plan  :    Status is: Inpatient   DVT Prophylaxis  : Heparin  added 06/18/2024  Place and maintain sequential compression device Start: 06/14/24 0857 apixaban  (ELIQUIS ) tablet 5 mg    Lab Results  Component Value Date   PLT 304 06/22/2024    Diet :  Diet Order             DIET SOFT Room  service appropriate? Yes; Fluid consistency: Thin  Diet effective now                    Inpatient Medications  Scheduled Meds:  amiodarone   200 mg Oral Daily   apixaban   5 mg Oral BID   Chlorhexidine  Gluconate Cloth  6 each Topical Q0600   feeding supplement  237 mL Oral TID BM   furosemide   20 mg Intravenous Once   Influenza vac split trivalent PF  0.5 mL Intramuscular Tomorrow-1000   insulin  aspart  0-9 Units Subcutaneous TID AC & HS   ipratropium  2 spray Each Nare BID   pantoprazole  (PROTONIX ) IV  40 mg Intravenous Q12H   potassium chloride   40 mEq Oral Once   sodium chloride  flush  10-40 mL Intracatheter Q12H   sodium chloride  flush  3 mL Intravenous Q12H   Continuous Infusions:  magnesium  sulfate bolus IVPB     PRN Meds:.hydrALAZINE , lip balm, [DISCONTINUED] ondansetron  **OR** ondansetron  (ZOFRAN ) IV, mouth rinse, prochlorperazine     Objective:   Vitals:   06/21/24 1930 06/22/24 0000 06/22/24 0400 06/22/24 0815  BP: 94/74 101/66 122/79 (!) 112/58  Pulse: 65 94 91   Resp: 20 17 14    Temp: 98 F (36.7 C) 98.2 F (36.8 C) 98 F (36.7 C) 98.2 F (36.8 C)  TempSrc: Oral Oral Oral Oral  SpO2: 91% 92% 95%   Weight:      Height:        Wt Readings from Last 3 Encounters:  06/21/24 68.2 kg  01/13/24 65.8 kg  01/11/24 65.3 kg     Intake/Output Summary (Last 24 hours) at 06/22/2024 0930 Last data filed at 06/22/2024 0500 Gross per 24 hour  Intake 2436.11 ml  Output 1555 ml  Net 881.11 ml     Physical Exam  Awake Alert, No new F.N deficits, Normal affect [place].AT,PERRAL Supple Neck, No JVD,   Symmetrical Chest wall movement, still has few bilateral bibasilar rales  RRR,No Gallops,Rubs or new Murmurs,  +ve B.Sounds, Abd Soft, No tenderness,   No Cyanosis, Clubbing or edema     RN pressure injury documentation: Wound 06/11/24 0740 Pressure Injury Buttocks Medial Deep Tissue Pressure Injury - Purple or maroon localized area of discolored intact skin or  blood-filled blister due to damage of underlying soft tissue from pressure and/or shear. (Active)      Data Review:    Recent Labs  Lab 06/15/24 1639 06/16/24 0435 06/17/24 0430 06/18/24 0353 06/20/24 0101 06/21/24 0209 06/22/24 0349  WBC 18.0* 15.2* 14.9* 19.4* 11.6* 11.4* 12.0*  HGB 11.7* 10.0* 9.2* 9.0* 8.9* 8.8* 9.0*  HCT 34.4* 28.6* 27.7* 27.3* 27.1* 26.7* 27.2*  PLT 203 163 173 194 257 278 304  MCV 93.2 94.7 94.2 95.5 95.4 95.0 93.5  MCH 31.7 33.1 31.3 31.5 31.3 31.3 30.9  MCHC 34.0 35.0 33.2 33.0 32.8 33.0 33.1  RDW 13.0 13.3 13.3 13.5 13.3 13.4 13.3  LYMPHSABS 1.2 1.2  --   --  1.2 1.4 2.2  MONOABS 1.6* 1.5*  --   --  0.7 0.8 1.0  EOSABS 0.1 0.2  --   --  0.2 0.1 0.1  BASOSABS 0.1 0.1  --   --  0.1 0.1 0.1    Recent Labs  Lab 06/17/24 0430 06/18/24 0353 06/19/24 1215 06/20/24 0101 06/21/24 0209 06/22/24 0349  NA 134* 134* 135 136 133* 134*  K 4.5 4.7 4.1 4.2 4.4 3.7  CL 102 102 102 103 102 103  CO2 25 24 24 26 25 25   ANIONGAP 7 8 9 7 6 6   GLUCOSE 127* 112* 121* 122* 108* 91  BUN 23 25* 22 20 19 21   CREATININE 0.57* 0.60* 0.52* 0.60* 0.63 0.77  AST  --   --  42* 86* 101* 68*  ALT  --   --  50* 92* 134* 113*  ALKPHOS  --   --  50 55 63 60  BILITOT  --   --  0.6 0.6 0.3 0.4  ALBUMIN   --   --  2.1* 2.0* 1.9* 1.9*  CRP  --   --  19.8* 16.7* 12.0* 8.8*  PROCALCITON  --  <0.10 <0.10 <0.10 <0.10 <0.10  MG 2.0 2.1 1.8 1.8 1.9  --   PHOS 2.8 2.9 3.1 3.2 3.4  --   CALCIUM  8.3* 8.4* 8.3* 8.4* 8.2* 8.2*      Recent Labs  Lab 06/17/24 0430 06/18/24 0353 06/19/24 1215 06/20/24 0101 06/21/24 0209 06/22/24 0349  CRP  --   --  19.8* 16.7* 12.0* 8.8*  PROCALCITON  --  <0.10 <0.10 <0.10 <0.10 <0.10  MG 2.0 2.1 1.8 1.8 1.9  --   CALCIUM  8.3* 8.4* 8.3* 8.4* 8.2* 8.2*    --------------------------------------------------------------------------------------------------------------- Lab Results  Component Value Date   TRIG 36 06/19/2024    Lab Results   Component Value Date   HGBA1C 5.8 (H) 05/11/2021      Micro Results No results found for this or any previous visit (from the past 240 hours).   Radiology Report DG Chest Port 1 View Result Date: 06/22/2024 EXAM: 1 VIEW(S) XRAY OF THE CHEST 06/22/2024 06:37:08 AM COMPARISON: None, 06/19/2024. CLINICAL HISTORY: SOB (shortness of breath). SBO. ROVER. FINDINGS: LINES, TUBES AND DEVICES: Right arm PICC line with tip at the superior cavocaval junction. Enteric tube has been removed. LUNGS AND PLEURA: No focal pulmonary opacity. No pulmonary edema. Persistent small to moderate bilateral pleural effusions, right greater than left. No pneumothorax. HEART  AND MEDIASTINUM: Aortic atherosclerosis. No acute abnormality of the cardiac and mediastinal silhouettes. BONES AND SOFT TISSUES: No acute osseous abnormality. IMPRESSION: 1. Persistent small to moderate bilateral pleural effusions, right greater than left. 2. Right arm PICC with tip at the superior cavoatrial junction. Electronically signed by: Waddell Calk MD 06/22/2024 06:41 AM EDT RP Workstation: [id number]      Signature  -   Lavada Stank M.D on 06/22/2024 at 9:30 AM   -  To page go to [email or web address]

## 2024-06-22 NOTE — Plan of Care (Signed)
 Pt ambulated with assistance to the chair. Pt ate about 25% of meal. VS stable and no complains of pain.  Problem: Education: Goal: Knowledge of General Education information will improve Description: Including pain rating scale, medication(s)/side effects and non-pharmacologic comfort measures 06/22/2024 1648 by Jerona Expose, Aquilla HERO, RN Outcome: Progressing 06/22/2024 1254 by Jerona Ketchiozo, Aquilla HERO, RN Outcome: Progressing   Problem: Health Behavior/Discharge Planning: Goal: Ability to manage health-related needs will improve 06/22/2024 1648 by Jerona Expose, Aquilla HERO, RN Outcome: Progressing 06/22/2024 1254 by Jerona Ketchiozo, Aquilla HERO, RN Outcome: Progressing   Problem: Clinical Measurements: Goal: Ability to maintain clinical measurements within normal limits will improve 06/22/2024 1648 by Fanbissi Ketchiozo, Aquilla HERO, RN Outcome: Progressing 06/22/2024 1254 by Jerona Expose Aquilla HERO, RN Outcome: Progressing Goal: Will remain free from infection 06/22/2024 1648 by Fanbissi Ketchiozo, Aquilla HERO, RN Outcome: Progressing 06/22/2024 1254 by Jerona Expose Aquilla HERO, RN Outcome: Progressing Goal: Diagnostic test results will improve 06/22/2024 1648 by Jerona Expose, Aquilla HERO, RN Outcome: Progressing 06/22/2024 1254 by Jerona Expose Aquilla HERO, RN Outcome: Progressing Goal: Respiratory complications will improve 06/22/2024 1648 by Jerona Expose Aquilla HERO, RN Outcome: Progressing 06/22/2024 1254 by Jerona Expose Aquilla HERO, RN Outcome: Progressing Goal: Cardiovascular complication will be avoided 06/22/2024 1648 by Jerona Expose Aquilla HERO, RN Outcome: Progressing 06/22/2024 1254 by Jerona Expose Aquilla HERO, RN Outcome: Progressing   Problem: Activity: Goal: Risk for activity intolerance will decrease 06/22/2024 1648 by Jerona Expose, Aquilla HERO,  RN Outcome: Progressing 06/22/2024 1254 by Jerona Expose Aquilla HERO, RN Outcome: Progressing   Problem: Nutrition: Goal: Adequate nutrition will be maintained 06/22/2024 1648 by Jerona Expose, Aquilla HERO, RN Outcome: Progressing 06/22/2024 1254 by Jerona Expose Aquilla HERO, RN Outcome: Progressing   Problem: Coping: Goal: Level of anxiety will decrease 06/22/2024 1648 by Jerona Expose, Aquilla HERO, RN Outcome: Progressing 06/22/2024 1254 by Jerona Expose Aquilla HERO, RN Outcome: Progressing   Problem: Elimination: Goal: Will not experience complications related to bowel motility 06/22/2024 1648 by Jerona Expose, Aquilla HERO, RN Outcome: Progressing 06/22/2024 1254 by Jerona Expose Aquilla HERO, RN Outcome: Progressing Goal: Will not experience complications related to urinary retention 06/22/2024 1648 by Fanbissi Ketchiozo, Aquilla HERO, RN Outcome: Progressing 06/22/2024 1254 by Jerona Expose Aquilla HERO, RN Outcome: Progressing   Problem: Pain Managment: Goal: General experience of comfort will improve and/or be controlled 06/22/2024 1648 by Jerona Expose, Aquilla HERO, RN Outcome: Progressing 06/22/2024 1254 by Jerona Expose Aquilla HERO, RN Outcome: Progressing   Problem: Safety: Goal: Ability to remain free from injury will improve 06/22/2024 1648 by Jerona Expose, Aquilla HERO, RN Outcome: Progressing 06/22/2024 1254 by Jerona Expose Aquilla HERO, RN Outcome: Progressing   Problem: Skin Integrity: Goal: Risk for impaired skin integrity will decrease 06/22/2024 1648 by Jerona Expose, Aquilla HERO, RN Outcome: Progressing 06/22/2024 1254 by Jerona Expose Aquilla HERO, RN Outcome: Progressing   Problem: Education: Goal: Knowledge of disease or condition will improve 06/22/2024 1648 by Jerona Expose, Aquilla HERO, RN Outcome: Progressing 06/22/2024 1254 by Jerona Expose Aquilla HERO, RN Outcome: Progressing Goal: Understanding of medication regimen will improve 06/22/2024 1648 by Jerona Expose Aquilla HERO, RN Outcome: Progressing 06/22/2024 1254 by Jerona Expose Aquilla HERO, RN Outcome: Progressing Goal: Individualized Educational Video(s) 06/22/2024 1648 by Jerona Expose Aquilla HERO, RN Outcome: Progressing 06/22/2024 1254 by Jerona Expose Aquilla HERO, RN Outcome: Progressing   Problem: Activity: Goal: Ability to tolerate increased activity will improve 06/22/2024 1648 by Jerona Expose, Aquilla HERO, RN Outcome: Progressing 06/22/2024 1254 by Jerona  Ketchiozo, Aquilla HERO, RN Outcome: Progressing   Problem: Cardiac: Goal: Ability to achieve and maintain adequate cardiopulmonary perfusion will improve 06/22/2024 1648 by Jerona Expose, Aquilla HERO, RN Outcome: Progressing 06/22/2024 1254 by Jerona Expose Aquilla HERO, RN Outcome: Progressing   Problem: Health Behavior/Discharge Planning: Goal: Ability to safely manage health-related needs after discharge will improve 06/22/2024 1648 by Fanbissi Ketchiozo, Aquilla HERO, RN Outcome: Progressing 06/22/2024 1254 by Jerona Ketchiozo, Aquilla HERO, RN Outcome: Progressing

## 2024-06-22 NOTE — Progress Notes (Addendum)
 Progress Note  Patient Name: Gary Rice [facility] Date of Encounter: 06/22/2024 [facility] Cardiologist: None   Interval Summary    Appears to be in and out of Afib with controlled rates. Patient denies abdominal pain, he is eating soft diet. Remains on IV amiodarone  30mg /hr.   Albumin  1.9, AST 68,  ALT 113, sodium 134, WBC 12, Hgb 9, CRP 8.8  Vital Signs Vitals:   06/21/24 1930 06/22/24 0000 06/22/24 0400 06/22/24 0815  BP: 94/74 101/66 122/79 (!) 112/58  Pulse: 65 94 91   Resp: 20 17 14    Temp: 98 F (36.7 C) 98.2 F (36.8 C) 98 F (36.7 C) 98.2 F (36.8 C)  TempSrc: Oral Oral Oral Oral  SpO2: 91% 92% 95%   Weight:      Height:        Intake/Output Summary (Last 24 hours) at 06/22/2024 0906 Last data filed at 06/22/2024 0500 Gross per 24 hour  Intake 2436.11 ml  Output 1555 ml  Net 881.11 ml      06/21/2024    5:00 AM 06/20/2024    5:00 AM 06/19/2024    5:00 AM  Last 3 Weights  Weight (lbs) 150 lb 5.7 oz 156 lb 4.9 oz 160 lb 7.9 oz  Weight (kg) 68.2 kg 70.9 kg 72.8 kg      Telemetry/ECG  Pafib, NSR with PACs - Personally Reviewed  Physical Exam  GEN: No acute distress.   Neck: No JVD Cardiac: Irreg IRreg, no murmurs, rubs, or gallops.  Respiratory: Clear to auscultation bilaterally. GI: Soft, nontender, non-distended  MS: No edema  Patient Profile: Montrell Cessna is a 89 y.o. 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, ascending aortic aneurysm, HLD, Hx of inguinal hernia s/p repair in 2022, Hx of bowel resection in 2022 who is being seen 06/11/2024 for the evaluation of A-fib with RVR   Assessment & Plan   Pafib - appears to be in and out of Afib on tele but mostly in Afib, rates are controlled - IV amiodarone > can transition to oral amiodarone  - echo 05/2024 showed LVEF 50-55% - IV heparin  injections - resume home Eliquis  once stopping TPN and cleared by surgery  SBO - Has had surgery -  improving    For questions or updates, please contact [facility] Please consult [email or web address] for contact info under     Signed, Cadence VEAR Fishman, PA-C   I have seen and examined the patient along with Cadence VEAR Fishman, PA-C .  I have reviewed the chart, notes and new data.  I agree with PA/NP's note.  Key new complaints: Remains unaware of atrial fibrillation Key examination changes: Irregular rhythm rate controlled, no signs of CHF Key new findings / data: No episodes of RVR on telemetry  PLAN: Transition to amiodarone  200 mg p.o. once daily and Eliquis  5 mg p.o. twice daily. After full recovery from his acute illness, will plan outpatient 2-week arrhythmia monitor.  Will then decide whether or not amiodarone  is necessary as a long-term medication or we can return to the previous strategy of anticoagulation and diltiazem  as needed.  [facility] will sign off.   Medication Recommendations:   Amiodarone  200 mg once daily Eliquis  5 mg twice daily Other recommendations (labs, testing, etc): Outpatient 2-week arrhythmia monitor (we will arrange) Follow up as an outpatient: Will set up follow-up with Dr. Jeffrie in 1 month   Jerel Balding, MD, FACC [facility] (  [phone] 06/22/2024, 9:30 AM

## 2024-06-22 NOTE — Progress Notes (Signed)
 Mobility Specialist Progress Note:   06/22/24 1520  Mobility  Activity Pivoted/transferred from chair to bed  Level of Assistance Minimal assist, patient does 75% or more  Assistive Device Front wheel walker  Distance Ambulated (ft) 3 ft  Activity Response Tolerated well  Mobility Referral Yes  Mobility visit 1 Mobility  Mobility Specialist Start Time (ACUTE ONLY) 1520  Mobility Specialist Stop Time (ACUTE ONLY) 1540  Mobility Specialist Time Calculation (min) (ACUTE ONLY) 20 min   Pt agreeable to mobility session. Required minA to stand from recliner, with cues for hand placement. Tolerated transfer well on 4LO2, pt back in bed with all needs met. Alarm on.  Therisa Rana Mobility Specialist Please contact via SecureChat or  Rehab office at [phone]

## 2024-06-22 NOTE — TOC Progression Note (Signed)
 Transition of Care [facility]) - Progression Note    Patient Details  Name: Gary Rice MRN: [id number] Date of Birth: 1931-06-04  Transition of Care [facility] CM/SW Contact  Inocente GORMAN Kindle, LCSW Phone Number: 06/22/2024, 3:38 PM  Clinical Narrative:    11:45am-CSW and MSW Intern met with patient to discuss discharge plan. Patient asked why he could not go to inpatient rehab like he did one time before. CSW explained that he would have to have confirmation of 24/7 support from a caregiver at home to qualify for CIR as it has not been recommended. He stated he has no one at home as his wife is currently at the ED at [facility] as his neighbor had to take her. He stated he would like to stay at the hospital until his therapy is completed and then he will be able to return home and take care of his wife. CSW explained that he will not be able to remain in the hospital if he is medically stable to discharge, which is why SNF is recommended. He stated perhaps his spouse will also need to go. MSW Intern provided SNF bed offers and Medicare ratings list. He stated he will review with his neighbor.   2pm-CSW notified by [facility] ED CSW that she is attempting to place patient's wife via [facility] Medicare SNF waiver. She stated the only two mutual acceptings SNFs on the [facility] Waiver list would be [facility] and [facility]. Will follow up with spouse.   Expected Discharge Plan: Skilled Nursing Facility Barriers to Discharge: Continued Medical Work up, SNF Pending bed offer               Expected Discharge Plan and Services In-house Referral: Clinical Social Work Discharge Planning Services: CM Consult Post Acute Care Choice: Skilled Nursing Facility Living arrangements for the past 2 months: Single Family Home                                       Social Drivers of Health (SDOH) Interventions SDOH Screenings   Food Insecurity: No Food Insecurity (06/06/2024)  Housing: Low Risk  (06/06/2024)   Transportation Needs: No Transportation Needs (06/06/2024)  Utilities: Not At Risk (06/06/2024)  Depression (PHQ2-9): Low Risk  (06/18/2021)  Social Connections: Moderately Integrated (06/06/2024)  Tobacco Use: Medium Risk (06/16/2024)    Readmission Risk Interventions     No data to display

## 2024-06-22 NOTE — Progress Notes (Addendum)
 Progress Note  6 Days Post-Op  Subjective: Patient sometimes experiences pain when needing to pass flatus. Otherwise pain is minimal and well controlled. He is tolerating soft diet. Denies nausea and vomiting. Had a bowel movement.   ROS  All negative with the exception of above.  Objective: Vital signs in last 24 hours: Temp:  [98 F (36.7 C)-98.3 F (36.8 C)] 98.2 F (36.8 C) (09/25 0815) Pulse Rate:  [65-94] 91 (09/25 0400) Resp:  [14-25] 14 (09/25 0400) BP: (94-122)/(48-79) 112/58 (09/25 0815) SpO2:  [89 %-95 %] 95 % (09/25 0400) Last BM Date : 06/21/24  Intake/Output from previous day: 09/24 0701 - 09/25 0700 In: 2436.1 [P.O.:240; I.V.:2119.2; IV Piggyback:76.9] Out: 1555 [Urine:1555] Intake/Output this shift: Total I/O In: -  Out: 225 [Urine:225]  PE: General: Pleasant male who is laying in bed in NAD. HEENT: Head is normocephalic, atraumatic.  Heart: HR normal Lungs: Respiratory effort nonlabored. On 3L nasal cannula. Abd: Soft, Mild to moderate abdominal distention of soft ventral hernia. Incisions are C/D/I. Nontender to palpation. No rebound tenderness or guarding. Skin: Warm and dry Psych: A&Ox3 with an appropriate affect.    Lab Results:  Recent Labs    06/21/24 0209 06/22/24 0349  WBC 11.4* 12.0*  HGB 8.8* 9.0*  HCT 26.7* 27.2*  PLT 278 304   BMET Recent Labs    06/21/24 0209 06/22/24 0349  NA 133* 134*  K 4.4 3.7  CL 102 103  CO2 25 25  GLUCOSE 108* 91  BUN 19 21  CREATININE 0.63 0.77  CALCIUM  8.2* 8.2*   PT/INR No results for input(s): LABPROT, INR in the last 72 hours. CMP     Component Value Date/Time   NA 134 (L) 06/22/2024 0349   NA 143 04/23/2022 1454   K 3.7 06/22/2024 0349   CL 103 06/22/2024 0349   CO2 25 06/22/2024 0349   GLUCOSE 91 06/22/2024 0349   BUN 21 06/22/2024 0349   BUN 23 04/23/2022 1454   CREATININE 0.77 06/22/2024 0349   CALCIUM  8.2 (L) 06/22/2024 0349   PROT 6.2 (L) 06/22/2024 0349   ALBUMIN   1.9 (L) 06/22/2024 0349   AST 68 (H) 06/22/2024 0349   ALT 113 (H) 06/22/2024 0349   ALKPHOS 60 06/22/2024 0349   BILITOT 0.4 06/22/2024 0349   GFRNONAA >60 06/22/2024 0349   GFRAA  08/14/2007 0312    >60        The eGFR has been calculated using the MDRD equation. This calculation has not been validated in all clinical   Lipase     Component Value Date/Time   LIPASE 18 06/05/2024 1241       Studies/Results: DG Chest Port 1 View Result Date: 06/22/2024 EXAM: 1 VIEW(S) XRAY OF THE CHEST 06/22/2024 06:37:08 AM COMPARISON: None, 06/19/2024. CLINICAL HISTORY: SOB (shortness of breath). SBO. ROVER. FINDINGS: LINES, TUBES AND DEVICES: Right arm PICC line with tip at the superior cavocaval junction. Enteric tube has been removed. LUNGS AND PLEURA: No focal pulmonary opacity. No pulmonary edema. Persistent small to moderate bilateral pleural effusions, right greater than left. No pneumothorax. HEART AND MEDIASTINUM: Aortic atherosclerosis. No acute abnormality of the cardiac and mediastinal silhouettes. BONES AND SOFT TISSUES: No acute osseous abnormality. IMPRESSION: 1. Persistent small to moderate bilateral pleural effusions, right greater than left. 2. Right arm PICC with tip at the superior cavoatrial junction. Electronically signed by: Waddell Calk MD 06/22/2024 06:41 AM EDT RP Workstation: [id number]    Anti-infectives: Anti-infectives (From admission, onward)  Start     Dose/Rate Route Frequency Ordered Stop   06/11/24 0915  piperacillin -tazobactam (ZOSYN ) IVPB 3.375 g        3.375 g 12.5 mL/hr over 240 Minutes Intravenous Every 8 hours 06/11/24 0820 06/16/24 0118   06/11/24 0800  cefTRIAXone (ROCEPHIN) 1 g in sodium chloride  0.9 % 100 mL IVPB  Status:  Discontinued        1 g 200 mL/hr over 30 Minutes Intravenous Every 24 hours 06/11/24 0642 06/11/24 0710   06/11/24 0800  cefTRIAXone (ROCEPHIN) 2 g in sodium chloride  0.9 % 100 mL IVPB  Status:  Discontinued        2 g 200  mL/hr over 30 Minutes Intravenous Every 24 hours 06/11/24 0710 06/11/24 0815        Assessment/Plan SBO, admitted 9/8 and failed non-operative measures, ultimately required surgery 9/19 POD6 s/p diagnostic laparoscopy, laparoscopic LOA 45 minutes  - Afebrile, Hemodynamically stable. - WBC 12.0 and HGB 9.0. - Mobilize as able  - Tolerating soft diet. Will discontinue TPN today. - Continue IS. Wean O2 as able per primary - Will continue to follow.   FEN: soft diet, Discontinue TPN today. VTE: Discussed with attending; Okay to resume Eliquis . ID: Completed abx for aspiration. Foley: Removed POD#1, spont voids Dispo: Progressive care   Recommend heat/elevation for thrombophlebitis, consider NSAIDs   Below per primary-- Afib RVR - rate controlled now Aspiration pneumonia AKI HLD Aortic aneurysm  Normocytic anemia    LOS: 17 days   I reviewed nursing notes, specialist notes, hospitalist notes, last 24 h vitals and pain scores, last 48 h intake and output, last 24 h labs and trends, and last 24 h imaging results.   Marjorie Carlyon Favre, [facility] 06/22/2024, 11:02 AM Please see Amion for pager number during day hours 7:00am-4:30pm

## 2024-06-22 NOTE — Progress Notes (Signed)
 Occupational Therapy Treatment Patient Details Name: Gary Rice MRN: [id number] DOB: 1931-05-18 Today's Date: 06/22/2024   History of present illness Pt is a 89 y.o. M who presents 06/05/2024 with SBO. 9/9 s/p laparoscopy. Significant PMH:  A-fib on Cardizem  and Eliquis , history of SVT, history of inguinal hernia, history of bowel resection in 2022, inguinal hernia repair 2022.   OT comments  Patient received in supine and agreeable to OT treatment. Patient able to get to EOB with supervision and increased time. Patient performed knee extension on EOB to prepare for standing and declined going to sink for grooming tasks stating he did not want to walk far, just get in recliner. Patient was setup for grooming seated in recliner.  Patient will benefit from continued inpatient follow up therapy, <3 hours/day.  Acute OT to continue to follow to address established goals to facilitate DC to next venue of care.        If plan is discharge home, recommend the following:  A little help with walking and/or transfers;A lot of help with bathing/dressing/bathroom;Assistance with cooking/housework;Assist for transportation;Help with stairs or ramp for entrance   Equipment Recommendations  Other (comment)    Recommendations for Other Services      Precautions / Restrictions Precautions Precautions: Fall Recall of Precautions/Restrictions: Intact Precaution/Restrictions Comments: watch HR & SpO2, [place] O2, TPN       Mobility Bed Mobility Overal bed mobility: Needs Assistance Bed Mobility: Supine to Sit     Supine to sit: Supervision, HOB elevated, Used rails     General bed mobility comments: increased time    Transfers Overall transfer level: Needs assistance Equipment used: Rolling walker (2 wheels) Transfers: Sit to/from Stand, Bed to chair/wheelchair/BSC Sit to Stand: Supervision, Contact guard assist     Step pivot transfers: Min assist     General transfer comment: increased  time before attempting, patient performing knee extension before standing     Balance Overall balance assessment: Needs assistance Sitting-balance support: No upper extremity supported, Feet supported Sitting balance-Leahy Scale: Good     Standing balance support: Bilateral upper extremity supported, During functional activity Standing balance-Leahy Scale: Poor Standing balance comment: reliant on BUE support                           ADL either performed or assessed with clinical judgement   ADL Overall ADL's : Needs assistance/impaired     Grooming: Wash/dry hands;Wash/dry face;Oral care;Set up;Sitting Grooming Details (indicate cue type and reason): declined attempting at sink             Lower Body Dressing: Maximal assistance;Bed level Lower Body Dressing Details (indicate cue type and reason): socks                    Extremity/Trunk Assessment              Vision       Perception     Praxis     Communication Communication Communication: No apparent difficulties   Cognition Arousal: Alert Behavior During Therapy: WFL for tasks assessed/performed Cognition: No apparent impairments                               Following commands: Intact        Cueing   Cueing Techniques: Verbal cues  Exercises      Shoulder Instructions  General Comments on 4 liters O2 with SpO2 92% BP 102/53 (68) supine    Pertinent Vitals/ Pain       Pain Assessment Pain Assessment: Faces Faces Pain Scale: Hurts a little bit Pain Location: BLE knees when standing Pain Descriptors / Indicators: Discomfort Pain Intervention(s): Monitored during session, Repositioned  Home Living                                          Prior Functioning/Environment              Frequency  Min 2X/week        Progress Toward Goals  OT Goals(current goals can now be found in the care plan section)  Progress towards OT  goals: Progressing toward goals  Acute Rehab OT Goals Patient Stated Goal: to walk more when stronger OT Goal Formulation: With patient Time For Goal Achievement: 07/02/24 Potential to Achieve Goals: Good ADL Goals Pt Will Perform Grooming: with modified independence;standing Pt Will Perform Lower Body Bathing: with modified independence;sitting/lateral leans;sit to/from stand Pt Will Perform Lower Body Dressing: with modified independence;sitting/lateral leans;sit to/from stand Pt Will Transfer to Toilet: with modified independence;ambulating;regular height toilet (with least restrictive AD) Pt Will Perform Toileting - Clothing Manipulation and hygiene: with modified independence;sitting/lateral leans;sit to/from stand Additional ADL Goal #1: Patient will demonstrate ability to Independently state 3 energy conservation strategies to increase safety and independence with functional tasks.  Plan      Co-evaluation                 AM-PAC OT 6 Clicks Daily Activity     Outcome Measure   Help from another person eating meals?: None Help from another person taking care of personal grooming?: A Little Help from another person toileting, which includes using toliet, bedpan, or urinal?: A Lot Help from another person bathing (including washing, rinsing, drying)?: A Lot Help from another person to put on and taking off regular upper body clothing?: A Little Help from another person to put on and taking off regular lower body clothing?: A Lot 6 Click Score: 16    End of Session Equipment Utilized During Treatment: Oxygen;Rolling walker (2 wheels)  OT Visit Diagnosis: Unsteadiness on feet (R26.81);Muscle weakness (generalized) (M62.81) Pain - Right/Left: Right Pain - part of body: Knee   Activity Tolerance Patient tolerated treatment well   Patient Left in chair;with call bell/phone within reach;with chair alarm set   Nurse Communication Mobility status        Time:  8792-8764 OT Time Calculation (min): 28 min  Charges: OT General Charges $OT Visit: 1 Visit OT Treatments $Self Care/Home Management : 8-22 mins $Therapeutic Activity: 8-22 mins  Dick Laine, OTA Acute Rehabilitation Services  Office [phone]   Jeb LITTIE Laine 06/22/2024, 2:25 PM

## 2024-06-22 NOTE — Progress Notes (Signed)
 Calorie Count Note - Day 2  48 hour calorie count ordered. Day 2 now complete and results below for review.  Estimated Nutritional Needs:  Kcal:  1700-2000kcal/day Protein:  85-100g/day Fluid:  1.7-2.0L/day  Patient intake slowly improving. He reports appetite is present, but food is too dry and, therefore, not palatable. Recommended ordering gravy w/ meals to moisten food. Also recommended foods that may not be as susceptible to drying out, like tuna salad or chicken salad. Also discussed advancing diet to regular texture. PA-C amicable to this and diet order upgraded to maximize intake. Will liberalize to heart healthy to regular to further maximize choices available to him.   AVERAGE ACROSS 2 DAYS: 49% estimated calorie needs, 41% estimated protein needs  Given the trajectory of his intake and that it is trending up, do not currently see the need for enteral nutrition support as intake will likely improve as his choices are maximized and environment modified. Given his desirable intake of ONS will increase Ensure Plus High Protein to 3x daily to augment current intake. Says he likes them and documented as accepting.  Diet: Soft diet, thin liquids  Supplements: Ensure Plus HP TID, Magic Cup TID   Breakfast: No documentation received Lunch: 25% pan seared salmon, green beans, tropical fruit ice, and banana pudding (138 kcals, 5g protein) Dinner: 25% of spaghetti w/ meat sauce, whole green beans, peach cobbler, vanilla ice cream, and cream of chicken soup (214 kcals, 6g protein) Supplements: 75% Ensure Plus High Protein x2 (525 kcals, 30g protein)  Day 2 Total intake: 877 kcal (52% of minimum estimated needs)  41g protein (48% of minimum estimated needs)  Nutrition Dx: Severe Malnutrition related to acute illness as evidenced by severe muscle depletion, moderate fat depletion    Goal: Patient will meet greater than or equal to 90% of their needs    Intervention:  If able, recommend  advancing to regular texture diet to maximize PO choices available to him - messaged surgery Will monitor need to resume enteral nutrition and adequacy of intake Discontinue 48 hr calorie count to assess ability to wean nutrition support Increase Ensure Plus High Protein po TID, each supplement provides 350 kcal and 20 grams of protein  Continue Magic cup TID with meals, each supplement provides 290 kcal and 9 grams of protein  Continue MVI w/ minerals  Blair Deaner MS, RD, LDN Registered Dietitian Clinical Nutrition RD Inpatient Contact Info in Amion

## 2024-06-23 ENCOUNTER — Inpatient Hospital Stay

## 2024-06-23 ENCOUNTER — Telehealth: Payer: Self-pay | Admitting: *Deleted

## 2024-06-23 ENCOUNTER — Other Ambulatory Visit: Payer: Self-pay | Admitting: Medical

## 2024-06-23 DIAGNOSIS — I480 Paroxysmal atrial fibrillation: Secondary | ICD-10-CM

## 2024-06-23 DIAGNOSIS — K56609 Unspecified intestinal obstruction, unspecified as to partial versus complete obstruction: Secondary | ICD-10-CM | POA: Diagnosis not present

## 2024-06-23 LAB — CBC WITH DIFFERENTIAL/PLATELET
Abs Immature Granulocytes: 0.09 K/uL — ABNORMAL HIGH (ref 0.00–0.07)
Basophils Absolute: 0.1 K/uL (ref 0.0–0.1)
Basophils Relative: 0 %
Eosinophils Absolute: 0.2 K/uL (ref 0.0–0.5)
Eosinophils Relative: 2 %
HCT: 26.8 % — ABNORMAL LOW (ref 39.0–52.0)
Hemoglobin: 8.8 g/dL — ABNORMAL LOW (ref 13.0–17.0)
Immature Granulocytes: 1 %
Lymphocytes Relative: 17 %
Lymphs Abs: 1.9 K/uL (ref 0.7–4.0)
MCH: 31.2 pg (ref 26.0–34.0)
MCHC: 32.8 g/dL (ref 30.0–36.0)
MCV: 95 fL (ref 80.0–100.0)
Monocytes Absolute: 0.9 K/uL (ref 0.1–1.0)
Monocytes Relative: 8 %
Neutro Abs: 8.3 K/uL — ABNORMAL HIGH (ref 1.7–7.7)
Neutrophils Relative %: 72 %
Platelets: 318 K/uL (ref 150–400)
RBC: 2.82 MIL/uL — ABNORMAL LOW (ref 4.22–5.81)
RDW: 13.4 % (ref 11.5–15.5)
WBC: 11.4 K/uL — ABNORMAL HIGH (ref 4.0–10.5)
nRBC: 0 % (ref 0.0–0.2)

## 2024-06-23 LAB — GLUCOSE, CAPILLARY
Glucose-Capillary: 100 mg/dL — ABNORMAL HIGH (ref 70–99)
Glucose-Capillary: 103 mg/dL — ABNORMAL HIGH (ref 70–99)
Glucose-Capillary: 131 mg/dL — ABNORMAL HIGH (ref 70–99)
Glucose-Capillary: 102 mg/dL — ABNORMAL HIGH (ref 70–99)

## 2024-06-23 LAB — BASIC METABOLIC PANEL WITH GFR
Anion gap: 5 (ref 5–15)
BUN: 20 mg/dL (ref 8–23)
CO2: 26 mmol/L (ref 22–32)
Calcium: 8.2 mg/dL — ABNORMAL LOW (ref 8.9–10.3)
Chloride: 101 mmol/L (ref 98–111)
Creatinine, Ser: 0.74 mg/dL (ref 0.61–1.24)
GFR, Estimated: >60 mL/min (ref >60)
Glucose, Bld: 109 mg/dL — ABNORMAL HIGH (ref 70–99)
Potassium: 4.1 mmol/L (ref 3.5–5.1)
Sodium: 132 mmol/L — ABNORMAL LOW (ref 135–145)

## 2024-06-23 LAB — HEMOGLOBIN A1C
Hgb A1c MFr Bld: 6 % — ABNORMAL HIGH (ref 4.8–5.6)
Mean Plasma Glucose: 125.5 mg/dL

## 2024-06-23 LAB — C-REACTIVE PROTEIN: CRP: 7.3 mg/dL — ABNORMAL HIGH (ref <1.0)

## 2024-06-23 LAB — MAGNESIUM: Magnesium: 1.8 mg/dL (ref 1.7–2.4)

## 2024-06-23 LAB — PROCALCITONIN: Procalcitonin: <0.1 ng/mL

## 2024-06-23 LAB — PHOSPHORUS: Phosphorus: 3.2 mg/dL (ref 2.5–4.6)

## 2024-06-23 MED ORDER — INSULIN ASPART 100 UNIT/ML IJ SOLN: 0.0000 [IU] | Freq: Every day | INTRAMUSCULAR | Status: DC

## 2024-06-23 MED ORDER — INSULIN ASPART 100 UNIT/ML IJ SOLN
0.0000 [IU] | Freq: Three times a day (TID) | INTRAMUSCULAR | Status: DC
  Administered 2024-06-23: 1 [IU] via SUBCUTANEOUS

## 2024-06-23 MED ORDER — ATORVASTATIN CALCIUM 10 MG PO TABS
10.0000 mg | ORAL_TABLET | Freq: Every day | ORAL | Status: DC
  Administered 2024-06-23: 10 mg via ORAL
  Filled 2024-06-23: qty 1

## 2024-06-23 NOTE — Progress Notes (Signed)
 Mobility Specialist Progress Note:   06/23/24 1000  Mobility  Activity Pivoted/transferred from bed to chair  Level of Assistance Contact guard assist, steadying assist  Assistive Device Front wheel walker  Distance Ambulated (ft) 5 ft  Activity Response Tolerated well  Mobility Referral Yes  Mobility visit 1 Mobility  Mobility Specialist Start Time (ACUTE ONLY) 1000  Mobility Specialist Stop Time (ACUTE ONLY) 1015  Mobility Specialist Time Calculation (min) (ACUTE ONLY) 15 min   Pt agreeable to mobility session. Required only CGA to stand and pivot to chair. No c/o throughout, left in chair with all needs met. Alarm on. VSS on 4LO2.   Therisa Rana Mobility Specialist Please contact via SecureChat or  Rehab office at [phone]

## 2024-06-23 NOTE — Progress Notes (Signed)
 Progress Note  7 Days Post-Op  Subjective: Patient tolerating heart healthy diet. Denies pain. Having bowel movements and flatulence. Denies nausea and vomiting.   ROS  All negative with the exception of above.  Objective: Vital signs in last 24 hours: Temp:  [97.9 F (36.6 C)-98.7 F (37.1 C)] 98 F (36.7 C) (09/26 0800) Pulse Rate:  [60-96] 69 (09/26 0800) Resp:  [18-24] 24 (09/26 0800) BP: (79-119)/(55-85) 110/59 (09/26 0800) SpO2:  [92 %-96 %] 96 % (09/26 0800) Last BM Date : 06/23/24  Intake/Output from previous day: 09/25 0701 - 09/26 0700 In: -  Out: 625 [Urine:625] Intake/Output this shift: No intake/output data recorded.  PE: General: Pleasant male who is laying in bed in NAD. HEENT: Head is normocephalic, atraumatic.  Heart: HR normal Lungs: Respiratory effort nonlabored. On 3L nasal cannula. Abd: Soft, Mild to moderate abdominal distention of soft ventral hernia. Incisions are C/D/I. Nontender to palpation. No rebound tenderness or guarding. Skin: Warm and dry Psych: A&Ox3 with an appropriate affect.    Lab Results:  Recent Labs    06/22/24 0349 06/23/24 0422  WBC 12.0* 11.4*  HGB 9.0* 8.8*  HCT 27.2* 26.8*  PLT 304 318   BMET Recent Labs    06/22/24 0349 06/23/24 0422  NA 134* 132*  K 3.7 4.1  CL 103 101  CO2 25 26  GLUCOSE 91 109*  BUN 21 20  CREATININE 0.77 0.74  CALCIUM  8.2* 8.2*   PT/INR No results for input(s): LABPROT, INR in the last 72 hours. CMP     Component Value Date/Time   NA 132 (L) 06/23/2024 0422   NA 143 04/23/2022 1454   K 4.1 06/23/2024 0422   CL 101 06/23/2024 0422   CO2 26 06/23/2024 0422   GLUCOSE 109 (H) 06/23/2024 0422   BUN 20 06/23/2024 0422   BUN 23 04/23/2022 1454   CREATININE 0.74 06/23/2024 0422   CALCIUM  8.2 (L) 06/23/2024 0422   PROT 6.2 (L) 06/22/2024 0349   ALBUMIN  1.9 (L) 06/22/2024 0349   AST 68 (H) 06/22/2024 0349   ALT 113 (H) 06/22/2024 0349   ALKPHOS 60 06/22/2024 0349    BILITOT 0.4 06/22/2024 0349   GFRNONAA >60 06/23/2024 0422   GFRAA  08/14/2007 0312    >60        The eGFR has been calculated using the MDRD equation. This calculation has not been validated in all clinical   Lipase     Component Value Date/Time   LIPASE 18 06/05/2024 1241       Studies/Results: DG Chest Port 1 View Result Date: 06/22/2024 EXAM: 1 VIEW(S) XRAY OF THE CHEST 06/22/2024 06:37:08 AM COMPARISON: None, 06/19/2024. CLINICAL HISTORY: SOB (shortness of breath). SBO. ROVER. FINDINGS: LINES, TUBES AND DEVICES: Right arm PICC line with tip at the superior cavocaval junction. Enteric tube has been removed. LUNGS AND PLEURA: No focal pulmonary opacity. No pulmonary edema. Persistent small to moderate bilateral pleural effusions, right greater than left. No pneumothorax. HEART AND MEDIASTINUM: Aortic atherosclerosis. No acute abnormality of the cardiac and mediastinal silhouettes. BONES AND SOFT TISSUES: No acute osseous abnormality. IMPRESSION: 1. Persistent small to moderate bilateral pleural effusions, right greater than left. 2. Right arm PICC with tip at the superior cavoatrial junction. Electronically signed by: Waddell Calk MD 06/22/2024 06:41 AM EDT RP Workstation: [id number]    Anti-infectives: Anti-infectives (From admission, onward)    Start     Dose/Rate Route Frequency Ordered Stop   06/11/24 0915  piperacillin -tazobactam (ZOSYN )  IVPB 3.375 g        3.375 g 12.5 mL/hr over 240 Minutes Intravenous Every 8 hours 06/11/24 0820 06/16/24 0118   06/11/24 0800  cefTRIAXone (ROCEPHIN) 1 g in sodium chloride  0.9 % 100 mL IVPB  Status:  Discontinued        1 g 200 mL/hr over 30 Minutes Intravenous Every 24 hours 06/11/24 0642 06/11/24 0710   06/11/24 0800  cefTRIAXone (ROCEPHIN) 2 g in sodium chloride  0.9 % 100 mL IVPB  Status:  Discontinued        2 g 200 mL/hr over 30 Minutes Intravenous Every 24 hours 06/11/24 0710 06/11/24 0815        Assessment/Plan SBO, admitted  9/8 and failed non-operative measures, ultimately required surgery 9/19 POD7 s/p diagnostic laparoscopy, laparoscopic LOA 45 minutes  - Afebrile. - WBC 11.4 and HGB 8.8. - Mobilize as tolerated. - TPN discontinued on 9/25. Tolerating HH diet. Calorie count ongoing. Having BM and flatulence. - Continue IS. Wean O2 as able per primary - Will continue to follow.   FEN: HH diet. VTE: Eliquis  ID: Completed abx for aspiration. Foley: Removed POD#1, spont voids   Below per primary-- Afib RVR - rate controlled now Aspiration pneumonia AKI HLD Aortic aneurysm  Normocytic anemia     LOS: 18 days   I reviewed specialist notes, nursing notes, hospitalist notes, last 24 h vitals and pain scores, last 48 h intake and output, last 24 h labs and trends, and last 24 h imaging results.   Marjorie Carlyon Favre, [facility] Surgery 06/23/2024, 10:29 AM Please see Amion for pager number during day hours 7:00am-4:30pm

## 2024-06-23 NOTE — Progress Notes (Signed)
 Physical Therapy Treatment Patient Details Name: Gary Rice MRN: [id number] DOB: 10-23-1930 Today's Date: 06/23/2024   History of Present Illness Pt is a 89 y.o. M who presents 06/05/2024 with SBO. 9/9 s/p laparoscopy. Significant PMH:  A-fib on Cardizem  and Eliquis , history of SVT, history of inguinal hernia, history of bowel resection in 2022, inguinal hernia repair 2022.    PT Comments  Pt tolerated treatment well today. Pt today was able to transfer to and from [facility] with RW CGA. Declined ambulation. No change in DC/DME recs at this time. PT will continue to follow.     If plan is discharge home, recommend the following: Assistance with cooking/housework;Assist for transportation;Help with stairs or ramp for entrance;A little help with walking and/or transfers   Can travel by private vehicle     No  Equipment Recommendations  Rollator (4 wheels);Other (comment)    Recommendations for Other Services       Precautions / Restrictions Precautions Precautions: Fall Recall of Precautions/Restrictions: Intact Precaution/Restrictions Comments: watch HR & SpO2, [place] O2, TPN Restrictions Weight Bearing Restrictions Per Provider Order: No     Mobility  Bed Mobility Overal bed mobility: Needs Assistance Bed Mobility: Sit to Supine       Sit to supine: Contact guard assist   General bed mobility comments: increased time    Transfers Overall transfer level: Needs assistance Equipment used: Rolling walker (2 wheels) Transfers: Sit to/from Stand, Bed to chair/wheelchair/BSC Sit to Stand: Supervision, Contact guard assist   Step pivot transfers: Contact guard assist       General transfer comment: Able to transfer to [facility] and back to bed. Declined ambulation.    Ambulation/Gait               General Gait Details: Pt declined   Stairs             Wheelchair Mobility     Tilt Bed    Modified Rankin (Stroke Patients Only)       Balance Overall  balance assessment: Needs assistance Sitting-balance support: No upper extremity supported, Feet supported Sitting balance-Leahy Scale: Good     Standing balance support: Bilateral upper extremity supported, During functional activity Standing balance-Leahy Scale: Poor Standing balance comment: reliant on BUE support                            Communication Communication Communication: No apparent difficulties  Cognition Arousal: Alert Behavior During Therapy: WFL for tasks assessed/performed   PT - Cognitive impairments: No apparent impairments                         Following commands: Intact      Cueing Cueing Techniques: Verbal cues  Exercises      General Comments General comments (skin integrity, edema, etc.): VSS      Pertinent Vitals/Pain Pain Assessment Pain Assessment: No/denies pain    Home Living                          Prior Function            PT Goals (current goals can now be found in the care plan section) Progress towards PT goals: Progressing toward goals    Frequency    Min 2X/week      PT Plan      Co-evaluation  AM-PAC PT 6 Clicks Mobility   Outcome Measure  Help needed turning from your back to your side while in a flat bed without using bedrails?: A Little Help needed moving from lying on your back to sitting on the side of a flat bed without using bedrails?: A Little Help needed moving to and from a bed to a chair (including a wheelchair)?: A Little Help needed standing up from a chair using your arms (e.g., wheelchair or bedside chair)?: A Little Help needed to walk in hospital room?: A Lot Help needed climbing 3-5 steps with a railing? : Total 6 Click Score: 15    End of Session Equipment Utilized During Treatment: Gait belt;Oxygen Activity Tolerance: Patient tolerated treatment well Patient left: in bed;with call bell/phone within reach;with bed alarm set Nurse  Communication: Mobility status PT Visit Diagnosis: Unsteadiness on feet (R26.81);Other abnormalities of gait and mobility (R26.89);Muscle weakness (generalized) (M62.81);Difficulty in walking, not elsewhere classified (R26.2)     Time: 8496-8467 PT Time Calculation (min) (ACUTE ONLY): 29 min  Charges:    $Therapeutic Activity: 23-37 mins PT General Charges $$ ACUTE PT VISIT: 1 Visit                     Gary Rice, PT, DPT Acute Rehab Services [phone]    Gary Rice 06/23/2024, 4:49 PM

## 2024-06-23 NOTE — TOC Progression Note (Signed)
 Transition of Care [facility]) - Progression Note    Patient Details  Name: Gary Rice MRN: [id number] Date of Birth: [birth year]  [facility] CM/SW Contact  Inocente GORMAN Kindle, LCSW Phone Number: 06/23/2024, 12:29 PM  Clinical Narrative:    CSW met with patient and discussed discharge plan. Patient is aware that the CSW at [facility] is attempting to place patient with his wife at the same SNF. CSW explained that his wife's options are limited due to requiring a SNF [facility] Waiver and patient reported understanding. His wife's CSW has contacted [facility] with availability for both of them. Patient reported agreement for them to both go to [facility]. He stated that apparently his wife had been refusing things at [facility] but that she has snapped out of it and was apologetic. Patient stated that patient should be able to do her own paperwork but that if not, her brother (lives in [place], [place]) is first choice of HCPOA and their neighbor, Lolita, is the next. CSW updated [facility] and [employer] CSW.    Expected Discharge Plan: Skilled Nursing Facility Barriers to Discharge: Continued Medical Work up               Expected Discharge Plan and Services In-house Referral: Clinical Social Work Discharge Planning Services: CM Consult Post Acute Care Choice: Skilled Nursing Facility Living arrangements for the past 2 months: Single Family Home                                       Social Drivers of Health (SDOH) Interventions SDOH Screenings   Food Insecurity: No Food Insecurity (06/06/2024)  Housing: Low Risk  (06/06/2024)  Transportation Needs: No Transportation Needs (06/06/2024)  Utilities: Not At Risk (06/06/2024)  Depression (PHQ2-9): Low Risk  (06/18/2021)  Social Connections: Moderately Integrated (06/06/2024)  Tobacco Use: Medium Risk (06/16/2024)    Readmission Risk Interventions     No data to display

## 2024-06-23 NOTE — Progress Notes (Signed)
 PROGRESS NOTE                                                                                                                                                                                                             Patient Demographics:    Gary Rice, is a 89 y.o. male, DOB - [birth year], FMW:[id number]  Outpatient Primary MD for the patient is Kip Righter, MD    LOS - 18  Admit date - 06/05/2024    Chief Complaint  Patient presents with   Constipation       Brief Narrative (HPI from H&P)   89 y.o. male with history of A-fib-presented with SBO and A-fib RVR. SBO initially managed with conservative measures with some improvement-A-fib RVR managed with Cardizem /heparin  infusion-unfortunately-on 9/13-developed worsening gastric distention-vomited and aspirated, along with A-fib RVR and hypotension. He was subsequently transferred to the ICU for close monitoring. Started on antibiotics for aspiration pneumonia, critical care, cardiology consulted and general surgery following.  He was transferred to my service on 06/18/2024 on day 13 of his hospital stay.   Subjective:   Patient in bed, appears comfortable, denies any headache, no fever, no chest pain or pressure, no shortness of breath , no abdominal pain. No new focal weakness.   Assessment  & Plan :   SBO initially being managed conservatively, general surgery on board finally had laparoscopic surgical intervention on 06/16/2024 - Still getting bowel rest, NG tube initially clamped and finally removed on 06/20/2024, now on oral diet TNA discontinued, having bowel movements passing flatus.  Close to baseline and symptom-free.  General surgery on board.   Chronic A-fib with RVR/hemoptysis/epistaxis: Initially rate controlled with Cardizem /beta-blocker but due to worsening bowel obstruction/aspiration--> converted to sinus rhythm this afternoon. Cardiology following on amiodarone   drip will be switched to oral amiodarone  on 06/22/2024 - Anticoagulation being held secondary to hemoptysis EVM 9/18 in fact he just had surgery.  Initially on heparin  drip now on Eliquis  started on 06/22/2024    Acute hypoxic respiratory failure secondary to aspiration pneumonia: Likely due to aspiration of vomitus, patient IV Zosyn  on 06/15/2024, still on oxygen.  Mild third spacing of fluid, TNA was discontinued, adequately diuresed, much improved. Very weak cough reflex, requested to sit in chair use I-S and flutter valve for pulmonary toiletry.  Monitor closely.  Does have mild to  moderate bilateral pleural effusions but currently relatively stable with no shortness of breath on 2 L nasal cannula oxygen.  Acute blood loss anemia:  - Came in with normal hemoglobin, was noted to have hemoptysis as well as coffee-ground emesis which was positive for occult blood - Some perioperative blood loss after laparoscopic abdominal surgery, continue to monitor CBC closely, type screen and monitor.   AKI Hemodynamically mediated Resolved.   Acute hyponatremia:  - Hovering around 130.  He is asymptomatic.  Monitor.  Stable.    HLD Resume home statin   Aortic aneurysm: 4.6 cm.  Needs surveillance.  Defer to PCP.        Condition - Extremely Guarded  Family Communication  : Updated patient's brother in law Garrel [phone]  in detail on 06/18/2024 he is the primary contact  Code Status : DNR  Consults  : General Surgery, cardiology, PCCM, palliative care  PUD Prophylaxis : PPI   Procedures  :            Disposition Plan  :    Status is: Inpatient   DVT Prophylaxis  : Heparin  added 06/18/2024  Place and maintain sequential compression device Start: 06/14/24 0857 apixaban  (ELIQUIS ) tablet 5 mg    Lab Results  Component Value Date   PLT 318 06/23/2024    Diet :  Diet Order             Diet Heart Room service appropriate? Yes; Fluid consistency: Thin  Diet effective now                     Inpatient Medications  Scheduled Meds:  amiodarone   200 mg Oral Daily   apixaban   5 mg Oral BID   Chlorhexidine  Gluconate Cloth  6 each Topical Q0600   feeding supplement  237 mL Oral TID BM   Influenza vac split trivalent PF  0.5 mL Intramuscular Tomorrow-1000   insulin  aspart  0-5 Units Subcutaneous QHS   insulin  aspart  0-9 Units Subcutaneous TID WC   ipratropium  2 spray Each Nare BID   pantoprazole   40 mg Oral BID   sodium chloride  flush  10-40 mL Intracatheter Q12H   sodium chloride  flush  3 mL Intravenous Q12H   Continuous Infusions:   PRN Meds:.hydrALAZINE , lip balm, [DISCONTINUED] ondansetron  **OR** ondansetron  (ZOFRAN ) IV, mouth rinse, prochlorperazine     Objective:   Vitals:   06/22/24 2021 06/23/24 0000 06/23/24 0440 06/23/24 0800  BP: 119/85 (!) 107/55 (!) 113/58 (!) 110/59  Pulse: 96 60 68 69  Resp: 18 19 20  (!) 24  Temp: 98.7 F (37.1 C) 98.2 F (36.8 C) 97.9 F (36.6 C) 98 F (36.7 C)  TempSrc: Oral Oral Oral Oral  SpO2: 92% 95% 94% 96%  Weight:      Height:        Wt Readings from Last 3 Encounters:  06/21/24 68.2 kg  01/13/24 65.8 kg  01/11/24 65.3 kg     Intake/Output Summary (Last 24 hours) at 06/23/2024 1016 Last data filed at 06/23/2024 0655 Gross per 24 hour  Intake --  Output 400 ml  Net -400 ml     Physical Exam  Awake Alert, No new F.N deficits, Normal affect [place].AT,PERRAL Supple Neck, No JVD,   Symmetrical Chest wall movement, still has few bilateral bibasilar rales RRR,No Gallops,Rubs or new Murmurs,  +ve B.Sounds, Abd Soft, No tenderness,   No Cyanosis, Clubbing or edema     RN pressure injury documentation: Wound  06/11/24 0740 Pressure Injury Buttocks Medial Deep Tissue Pressure Injury - Purple or maroon localized area of discolored intact skin or blood-filled blister due to damage of underlying soft tissue from pressure and/or shear. (Active)      Data Review:    Recent Labs  Lab  06/18/24 0353 06/20/24 0101 06/21/24 0209 06/22/24 0349 06/23/24 0422  WBC 19.4* 11.6* 11.4* 12.0* 11.4*  HGB 9.0* 8.9* 8.8* 9.0* 8.8*  HCT 27.3* 27.1* 26.7* 27.2* 26.8*  PLT 194 257 278 304 318  MCV 95.5 95.4 95.0 93.5 95.0  MCH 31.5 31.3 31.3 30.9 31.2  MCHC 33.0 32.8 33.0 33.1 32.8  RDW 13.5 13.3 13.4 13.3 13.4  LYMPHSABS  --  1.2 1.4 2.2 1.9  MONOABS  --  0.7 0.8 1.0 0.9  EOSABS  --  0.2 0.1 0.1 0.2  BASOSABS  --  0.1 0.1 0.1 0.1    Recent Labs  Lab 06/18/24 0353 06/19/24 1215 06/20/24 0101 06/21/24 0209 06/22/24 0349 06/23/24 0422 06/23/24 0838  NA 134* 135 136 133* 134* 132*  --   K 4.7 4.1 4.2 4.4 3.7 4.1  --   CL 102 102 103 102 103 101  --   CO2 24 24 26 25 25 26   --   ANIONGAP 8 9 7 6 6 5   --   GLUCOSE 112* 121* 122* 108* 91 109*  --   BUN 25* 22 20 19 21 20   --   CREATININE 0.60* 0.52* 0.60* 0.63 0.77 0.74  --   AST  --  42* 86* 101* 68*  --   --   ALT  --  50* 92* 134* 113*  --   --   ALKPHOS  --  50 55 63 60  --   --   BILITOT  --  0.6 0.6 0.3 0.4  --   --   ALBUMIN   --  2.1* 2.0* 1.9* 1.9*  --   --   CRP  --  19.8* 16.7* 12.0* 8.8* 7.3*  --   PROCALCITON <0.10 <0.10 <0.10 <0.10 <0.10 <0.10  --   HGBA1C  --   --   --   --   --   --  6.0*  MG 2.1 1.8 1.8 1.9  --  1.8  --   PHOS 2.9 3.1 3.2 3.4  --  3.2  --   CALCIUM  8.4* 8.3* 8.4* 8.2* 8.2* 8.2*  --       Recent Labs  Lab 06/18/24 0353 06/19/24 1215 06/20/24 0101 06/21/24 0209 06/22/24 0349 06/23/24 0422 06/23/24 0838  CRP  --  19.8* 16.7* 12.0* 8.8* 7.3*  --   PROCALCITON <0.10 <0.10 <0.10 <0.10 <0.10 <0.10  --   HGBA1C  --   --   --   --   --   --  6.0*  MG 2.1 1.8 1.8 1.9  --  1.8  --   CALCIUM  8.4* 8.3* 8.4* 8.2* 8.2* 8.2*  --     --------------------------------------------------------------------------------------------------------------- Lab Results  Component Value Date   TRIG 36 06/19/2024    Lab Results  Component Value Date   HGBA1C 6.0 (H) 06/23/2024      Micro  Results No results found for this or any previous visit (from the past 240 hours).   Radiology Report DG Chest Port 1 View Result Date: 06/22/2024 EXAM: 1 VIEW(S) XRAY OF THE CHEST 06/22/2024 06:37:08 AM COMPARISON: None, 06/19/2024. CLINICAL HISTORY: SOB (shortness of breath). SBO. ROVER. FINDINGS: LINES, TUBES AND DEVICES:  Right arm PICC line with tip at the superior cavocaval junction. Enteric tube has been removed. LUNGS AND PLEURA: No focal pulmonary opacity. No pulmonary edema. Persistent small to moderate bilateral pleural effusions, right greater than left. No pneumothorax. HEART AND MEDIASTINUM: Aortic atherosclerosis. No acute abnormality of the cardiac and mediastinal silhouettes. BONES AND SOFT TISSUES: No acute osseous abnormality. IMPRESSION: 1. Persistent small to moderate bilateral pleural effusions, right greater than left. 2. Right arm PICC with tip at the superior cavoatrial junction. Electronically signed by: Waddell Calk MD 06/22/2024 06:41 AM EDT RP Workstation: [id number]      Signature  -   Lavada Stank M.D on 06/23/2024 at 10:16 AM   -  To page go to [email or web address]

## 2024-06-23 NOTE — Telephone Encounter (Signed)
-----   Message from Cadence VEAR Fishman sent at 06/23/2024  8:26 AM EDT ----- Regarding: heart monitor Patient needs 2 week heart monitor for Afib please

## 2024-06-23 NOTE — Plan of Care (Signed)
  Problem: Education: Goal: Knowledge of General Education information will improve Description: Including pain rating scale, medication(s)/side effects and non-pharmacologic comfort measures Outcome: Progressing   Problem: Health Behavior/Discharge Planning: Goal: Ability to manage health-related needs will improve Outcome: Progressing   Problem: Clinical Measurements: Goal: Ability to maintain clinical measurements within normal limits will improve Outcome: Progressing Goal: Will remain free from infection Outcome: Progressing Goal: Diagnostic test results will improve Outcome: Progressing Goal: Respiratory complications will improve Outcome: Progressing Goal: Cardiovascular complication will be avoided Outcome: Progressing   Problem: Activity: Goal: Risk for activity intolerance will decrease Outcome: Progressing   Problem: Nutrition: Goal: Adequate nutrition will be maintained Outcome: Progressing   Problem: Coping: Goal: Level of anxiety will decrease Outcome: Progressing   Problem: Elimination: Goal: Will not experience complications related to bowel motility Outcome: Progressing Goal: Will not experience complications related to urinary retention Outcome: Progressing   Problem: Pain Managment: Goal: General experience of comfort will improve and/or be controlled Outcome: Progressing   Problem: Safety: Goal: Ability to remain free from injury will improve Outcome: Progressing   Problem: Skin Integrity: Goal: Risk for impaired skin integrity will decrease Outcome: Progressing   Problem: Education: Goal: Knowledge of disease or condition will improve Outcome: Progressing Goal: Understanding of medication regimen will improve Outcome: Progressing Goal: Individualized Educational Video(s) Outcome: Progressing   Problem: Activity: Goal: Ability to tolerate increased activity will improve Outcome: Progressing   Problem: Cardiac: Goal: Ability to achieve  and maintain adequate cardiopulmonary perfusion will improve Outcome: Progressing   Problem: Health Behavior/Discharge Planning: Goal: Ability to safely manage health-related needs after discharge will improve Outcome: Progressing   Problem: Education: Goal: Ability to describe self-care measures that may prevent or decrease complications (Diabetes Survival Skills Education) will improve Outcome: Progressing Goal: Individualized Educational Video(s) Outcome: Progressing   Problem: Coping: Goal: Ability to adjust to condition or change in health will improve Outcome: Progressing   Problem: Fluid Volume: Goal: Ability to maintain a balanced intake and output will improve Outcome: Progressing   Problem: Health Behavior/Discharge Planning: Goal: Ability to identify and utilize available resources and services will improve Outcome: Progressing Goal: Ability to manage health-related needs will improve Outcome: Progressing   Problem: Metabolic: Goal: Ability to maintain appropriate glucose levels will improve Outcome: Progressing   Problem: Nutritional: Goal: Maintenance of adequate nutrition will improve Outcome: Progressing Goal: Progress toward achieving an optimal weight will improve Outcome: Progressing   Problem: Skin Integrity: Goal: Risk for impaired skin integrity will decrease Outcome: Progressing   Problem: Tissue Perfusion: Goal: Adequacy of tissue perfusion will improve Outcome: Progressing

## 2024-06-23 NOTE — Progress Notes (Unsigned)
 Enrolled patient for a 14 day Zio XT monitor to be mailed to patients home  [place] to read

## 2024-06-23 NOTE — Plan of Care (Signed)
  Problem: Education: Goal: Knowledge of General Education information will improve Description: Including pain rating scale, medication(s)/side effects and non-pharmacologic comfort measures Outcome: Progressing   Problem: Clinical Measurements: Goal: Ability to maintain clinical measurements within normal limits will improve Outcome: Progressing Goal: Will remain free from infection Outcome: Progressing Goal: Diagnostic test results will improve Outcome: Progressing Goal: Respiratory complications will improve Outcome: Progressing Goal: Cardiovascular complication will be avoided Outcome: Progressing   Problem: Activity: Goal: Risk for activity intolerance will decrease Outcome: Progressing   Problem: Nutrition: Goal: Adequate nutrition will be maintained Outcome: Progressing   Problem: Coping: Goal: Level of anxiety will decrease Outcome: Progressing   Problem: Elimination: Goal: Will not experience complications related to bowel motility Outcome: Progressing Goal: Will not experience complications related to urinary retention Outcome: Progressing   Problem: Pain Managment: Goal: General experience of comfort will improve and/or be controlled Outcome: Progressing   Problem: Safety: Goal: Ability to remain free from injury will improve Outcome: Progressing   Problem: Skin Integrity: Goal: Risk for impaired skin integrity will decrease Outcome: Progressing   Problem: Education: Goal: Understanding of medication regimen will improve Outcome: Progressing   Problem: Activity: Goal: Ability to tolerate increased activity will improve Outcome: Progressing   Problem: Cardiac: Goal: Ability to achieve and maintain adequate cardiopulmonary perfusion will improve Outcome: Progressing   Problem: Fluid Volume: Goal: Ability to maintain a balanced intake and output will improve Outcome: Progressing   Problem: Metabolic: Goal: Ability to maintain appropriate  glucose levels will improve Outcome: Progressing   Problem: Skin Integrity: Goal: Risk for impaired skin integrity will decrease Outcome: Progressing   Problem: Tissue Perfusion: Goal: Adequacy of tissue perfusion will improve Outcome: Progressing

## 2024-06-24 DIAGNOSIS — Z7401 Bed confinement status: Secondary | ICD-10-CM | POA: Diagnosis not present

## 2024-06-24 DIAGNOSIS — R2689 Other abnormalities of gait and mobility: Secondary | ICD-10-CM | POA: Diagnosis not present

## 2024-06-24 DIAGNOSIS — K5909 Other constipation: Secondary | ICD-10-CM | POA: Diagnosis not present

## 2024-06-24 DIAGNOSIS — R5381 Other malaise: Secondary | ICD-10-CM | POA: Diagnosis not present

## 2024-06-24 DIAGNOSIS — E43 Unspecified severe protein-calorie malnutrition: Secondary | ICD-10-CM | POA: Diagnosis not present

## 2024-06-24 DIAGNOSIS — D62 Acute posthemorrhagic anemia: Secondary | ICD-10-CM | POA: Diagnosis not present

## 2024-06-24 DIAGNOSIS — J9601 Acute respiratory failure with hypoxia: Secondary | ICD-10-CM | POA: Diagnosis not present

## 2024-06-24 DIAGNOSIS — M6281 Muscle weakness (generalized): Secondary | ICD-10-CM | POA: Diagnosis not present

## 2024-06-24 DIAGNOSIS — I4891 Unspecified atrial fibrillation: Secondary | ICD-10-CM | POA: Diagnosis not present

## 2024-06-24 DIAGNOSIS — I10 Essential (primary) hypertension: Secondary | ICD-10-CM | POA: Diagnosis not present

## 2024-06-24 DIAGNOSIS — Z48815 Encounter for surgical aftercare following surgery on the digestive system: Secondary | ICD-10-CM | POA: Diagnosis not present

## 2024-06-24 DIAGNOSIS — I712 Thoracic aortic aneurysm, without rupture, unspecified: Secondary | ICD-10-CM | POA: Diagnosis not present

## 2024-06-24 DIAGNOSIS — R41841 Cognitive communication deficit: Secondary | ICD-10-CM | POA: Diagnosis not present

## 2024-06-24 DIAGNOSIS — E785 Hyperlipidemia, unspecified: Secondary | ICD-10-CM | POA: Diagnosis not present

## 2024-06-24 DIAGNOSIS — I480 Paroxysmal atrial fibrillation: Secondary | ICD-10-CM | POA: Diagnosis not present

## 2024-06-24 DIAGNOSIS — K219 Gastro-esophageal reflux disease without esophagitis: Secondary | ICD-10-CM | POA: Diagnosis not present

## 2024-06-24 DIAGNOSIS — K56609 Unspecified intestinal obstruction, unspecified as to partial versus complete obstruction: Secondary | ICD-10-CM | POA: Diagnosis not present

## 2024-06-24 DIAGNOSIS — J690 Pneumonitis due to inhalation of food and vomit: Secondary | ICD-10-CM | POA: Diagnosis not present

## 2024-06-24 DIAGNOSIS — R531 Weakness: Secondary | ICD-10-CM | POA: Diagnosis not present

## 2024-06-24 DIAGNOSIS — E119 Type 2 diabetes mellitus without complications: Secondary | ICD-10-CM | POA: Diagnosis not present

## 2024-06-24 DIAGNOSIS — E559 Vitamin D deficiency, unspecified: Secondary | ICD-10-CM | POA: Diagnosis not present

## 2024-06-24 LAB — GLUCOSE, CAPILLARY: Glucose-Capillary: 102 mg/dL — ABNORMAL HIGH (ref 70–99)

## 2024-06-24 LAB — CBC WITH DIFFERENTIAL/PLATELET
Abs Immature Granulocytes: 0.1 K/uL — ABNORMAL HIGH (ref 0.00–0.07)
Basophils Absolute: 0 K/uL (ref 0.0–0.1)
Basophils Relative: 0 %
Eosinophils Absolute: 0.2 K/uL (ref 0.0–0.5)
Eosinophils Relative: 2 %
HCT: 27 % — ABNORMAL LOW (ref 39.0–52.0)
Hemoglobin: 8.7 g/dL — ABNORMAL LOW (ref 13.0–17.0)
Immature Granulocytes: 1 %
Lymphocytes Relative: 16 %
Lymphs Abs: 1.8 K/uL (ref 0.7–4.0)
MCH: 31 pg (ref 26.0–34.0)
MCHC: 32.2 g/dL (ref 30.0–36.0)
MCV: 96.1 fL (ref 80.0–100.0)
Monocytes Absolute: 0.8 K/uL (ref 0.1–1.0)
Monocytes Relative: 7 %
Neutro Abs: 8.2 K/uL — ABNORMAL HIGH (ref 1.7–7.7)
Neutrophils Relative %: 74 %
Platelets: 331 K/uL (ref 150–400)
RBC: 2.81 MIL/uL — ABNORMAL LOW (ref 4.22–5.81)
RDW: 13.3 % (ref 11.5–15.5)
WBC: 11.1 K/uL — ABNORMAL HIGH (ref 4.0–10.5)
nRBC: 0 % (ref 0.0–0.2)

## 2024-06-24 LAB — BASIC METABOLIC PANEL WITH GFR
Anion gap: 9 (ref 5–15)
BUN: 19 mg/dL (ref 8–23)
CO2: 27 mmol/L (ref 22–32)
Calcium: 8.5 mg/dL — ABNORMAL LOW (ref 8.9–10.3)
Chloride: 99 mmol/L (ref 98–111)
Creatinine, Ser: 0.81 mg/dL (ref 0.61–1.24)
GFR, Estimated: >60 mL/min (ref >60)
Glucose, Bld: 101 mg/dL — ABNORMAL HIGH (ref 70–99)
Potassium: 3.9 mmol/L (ref 3.5–5.1)
Sodium: 135 mmol/L (ref 135–145)

## 2024-06-24 LAB — MAGNESIUM: Magnesium: 1.8 mg/dL (ref 1.7–2.4)

## 2024-06-24 MED ORDER — INSULIN ASPART 100 UNIT/ML FLEXPEN: PEN_INJECTOR | SUBCUTANEOUS | Status: AC

## 2024-06-24 MED ORDER — PANTOPRAZOLE SODIUM 40 MG PO TBEC: 40.0000 mg | DELAYED_RELEASE_TABLET | Freq: Two times a day (BID) | ORAL | Status: AC

## 2024-06-24 MED ORDER — FUROSEMIDE 10 MG/ML IJ SOLN
40.0000 mg | Freq: Once | INTRAMUSCULAR | Status: AC
  Administered 2024-06-24: 40 mg via INTRAVENOUS
  Filled 2024-06-24: qty 4

## 2024-06-24 MED ORDER — MAGNESIUM SULFATE IN D5W 1-5 GM/100ML-% IV SOLN
1.0000 g | Freq: Once | INTRAVENOUS | Status: DC
Start: 2024-06-24 — End: 2024-06-24
  Filled 2024-06-24: qty 100

## 2024-06-24 MED ORDER — AMIODARONE HCL 200 MG PO TABS: 200.0000 mg | ORAL_TABLET | Freq: Every day | ORAL | Status: DC

## 2024-06-24 MED ORDER — POTASSIUM CHLORIDE CRYS ER 20 MEQ PO TBCR
40.0000 meq | EXTENDED_RELEASE_TABLET | Freq: Once | ORAL | Status: AC
Start: 2024-06-24 — End: 2024-06-24
  Administered 2024-06-24: 40 meq via ORAL
  Filled 2024-06-24: qty 2

## 2024-06-24 NOTE — Plan of Care (Signed)
  Problem: Education: Goal: Knowledge of General Education information will improve Description: Including pain rating scale, medication(s)/side effects and non-pharmacologic comfort measures Outcome: Completed/Met   Problem: Health Behavior/Discharge Planning: Goal: Ability to manage health-related needs will improve Outcome: Completed/Met   Problem: Clinical Measurements: Goal: Ability to maintain clinical measurements within normal limits will improve Outcome: Completed/Met Goal: Will remain free from infection Outcome: Completed/Met Goal: Diagnostic test results will improve Outcome: Completed/Met Goal: Respiratory complications will improve Outcome: Completed/Met Goal: Cardiovascular complication will be avoided Outcome: Completed/Met   Problem: Activity: Goal: Risk for activity intolerance will decrease Outcome: Completed/Met   Problem: Nutrition: Goal: Adequate nutrition will be maintained Outcome: Completed/Met   Problem: Coping: Goal: Level of anxiety will decrease Outcome: Completed/Met   Problem: Elimination: Goal: Will not experience complications related to bowel motility Outcome: Completed/Met Goal: Will not experience complications related to urinary retention Outcome: Completed/Met   Problem: Pain Managment: Goal: General experience of comfort will improve and/or be controlled Outcome: Completed/Met   Problem: Safety: Goal: Ability to remain free from injury will improve Outcome: Completed/Met   Problem: Skin Integrity: Goal: Risk for impaired skin integrity will decrease Outcome: Completed/Met   Problem: Education: Goal: Knowledge of disease or condition will improve Outcome: Completed/Met Goal: Understanding of medication regimen will improve Outcome: Completed/Met Goal: Individualized Educational Video(s) Outcome: Completed/Met   Problem: Activity: Goal: Ability to tolerate increased activity will improve Outcome: Completed/Met    Problem: Cardiac: Goal: Ability to achieve and maintain adequate cardiopulmonary perfusion will improve Outcome: Completed/Met   Problem: Health Behavior/Discharge Planning: Goal: Ability to safely manage health-related needs after discharge will improve Outcome: Completed/Met   Problem: Education: Goal: Ability to describe self-care measures that may prevent or decrease complications (Diabetes Survival Skills Education) will improve Outcome: Completed/Met Goal: Individualized Educational Video(s) Outcome: Completed/Met   Problem: Coping: Goal: Ability to adjust to condition or change in health will improve Outcome: Completed/Met   Problem: Fluid Volume: Goal: Ability to maintain a balanced intake and output will improve Outcome: Completed/Met   Problem: Health Behavior/Discharge Planning: Goal: Ability to identify and utilize available resources and services will improve Outcome: Completed/Met Goal: Ability to manage health-related needs will improve Outcome: Completed/Met   Problem: Metabolic: Goal: Ability to maintain appropriate glucose levels will improve Outcome: Completed/Met   Problem: Nutritional: Goal: Maintenance of adequate nutrition will improve Outcome: Completed/Met Goal: Progress toward achieving an optimal weight will improve Outcome: Completed/Met   Problem: Skin Integrity: Goal: Risk for impaired skin integrity will decrease Outcome: Completed/Met   Problem: Tissue Perfusion: Goal: Adequacy of tissue perfusion will improve Outcome: Completed/Met

## 2024-06-24 NOTE — TOC Transition Note (Signed)
 Transition of Care [facility]) - Discharge Note   Patient Details  Name: Gary Rice MRN: [id number] Date of Birth: [birth year]  [facility]) CM/SW Contact:  Bridget Cordella Simmonds, LCSW Phone Number: 06/24/2024, 9:37 AM   Clinical Narrative:   Pt discharging to [facility], room 425. RN report to [phone].  0900: CSW confirmed with Nikki/[facility] that they can receive pt today.     Final next level of care: Skilled Nursing Facility Barriers to Discharge: Barriers Resolved   Patient Goals and CMS Choice Patient states their goals for this hospitalization and ongoing recovery are:: to go home CMS Medicare.gov Compare Post Acute Care list provided to:: Patient Choice offered to / list presented to : Patient [place] ownership interest in [facility].provided to:: Patient    Discharge Placement   Existing PASRR number confirmed : 06/23/24          Patient chooses bed at: [facility] Patient to be transferred to facility by: ptar Name of family member notified: N/A Patient and family notified of of transfer: 06/23/24  Discharge Plan and Services Additional resources added to the After Visit Summary for   In-house Referral: Clinical Social Work Discharge Planning Services: CM Consult Post Acute Care Choice: Skilled Nursing Facility                               Social Drivers of Health (SDOH) Interventions SDOH Screenings   Food Insecurity: No Food Insecurity (06/06/2024)  Housing: Low Risk  (06/06/2024)  Transportation Needs: No Transportation Needs (06/06/2024)  Utilities: Not At Risk (06/06/2024)  Depression (PHQ2-9): Low Risk  (06/18/2021)  Social Connections: Moderately Integrated (06/06/2024)  Tobacco Use: Medium Risk (06/16/2024)     Readmission Risk Interventions     No data to display

## 2024-06-24 NOTE — Progress Notes (Signed)
 06/24/2024  Gary [facility] [id number] 22-Mar-1931  CARE TEAM: PCP: Gary Righter, MD  Outpatient Care Team: Patient Care Team: Gary Righter, MD as PCP - General (Family Medicine) Gary Oneil BROCKS, MD as Consulting Physician (Cardiology)  Inpatient Treatment Team: Treatment Team:  Gary Lavada POUR, MD Ccs, Md, MD Bobbette Half, MD Gary Corean BROCKS, RN [place], Parichat, RN [place], Dawn, RN Swist, Debbie, RN [address], Gary Rice, NT Gary Rice, Lulu, RN Gary Rice,  Hospital Gary Yetta RAMAN, RN Erlenmeyer, Bernardino HERO, RN Bridget, Cordella PARAS, LCSW   Problem List:   Principal Problem:   Small bowel obstruction [facility]) Active Problems:   Paroxysmal atrial fibrillation with RVR (HCC)   Hyperlipidemia   AKI (acute kidney injury)   Acute hypoxic respiratory failure (HCC)   Aspiration pneumonia due to gastric secretions (HCC)   Hypotension   Atrial fibrillation with RVR (HCC)   Palliative care by specialist   Protein-calorie malnutrition, severe   06/16/2024  Date of Surgery: 06/16/2024   Preoperative Diagnosis: BOWEL OBSTRUCTION    Postoperative Diagnosis: BOWEL OBSTRUCTION    Surgical Procedure:  Diagnostic laparoscopy Laparoscopic lysis of adhesions for 45 minutes   Operative Team Members:  Surgeons and Role:    * Stechschulte, Deward PARAS, MD - Primary    Findings: 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 - WBC 11.1 and HGB 8.7. - Mobilize as tolerated.  He is concerned that a week and I agree with therapies.  Defer need for home SNF to primary service - Wean off TPN 9/25.  - Tolerating HH diet. Calorie count ongoing. Having BM and flatulence. - Continue IS.  - Agree with diuresis.  wean O2 as  able per primary - Will continue to follow.   FEN: HH diet. VTE: Eliquis  ID: Completed abx for aspiration. Foley: Removed POD#1, spont voids   Below per primary-- Afib RVR - rate controlled now Aspiration pneumonia AKI HLD Aortic aneurysm  Normocytic anemia   I updated the patient's status to the patient  Recommendations were made.  Questions were answered.  He expressed understanding & appreciation.  -Disposition: Patient has return of bowel function and diet.  Stable to discharge from surgery standpoint.  We will follow more peripherally.  Suspect patient needs a little more time for diuresis and therapies to develop a discharge plan.  Defer to primary internal medicine service       I reviewed nursing notes, hospitalist notes, last 24 h vitals and pain scores, last 48 h intake and output, last 24 h labs and trends, and last 24 h imaging results.  I have reviewed this patient's available data, including medical history, events of note, test results, etc as part of my evaluation.   A significant portion of that time was spent in counseling. Care during the described time interval was provided by me.  This care required moderate level of medical decision making.  06/24/2024    Subjective: (Chief complaint)  Tolerating solid diet.  Had bowel movement.  Denies pain.  Feeling weak when he gets up and needing a walker.  He wants to use the therapies to get better.  Urinating a lot  Objective:  Vital signs:  Vitals:   06/24/24 0000 06/24/24 0400 06/24/24 0500  06/24/24 0750  BP: (!) 112/56 (!) 104/50  127/78  Pulse: 70 74  92  Resp: 20 20  20   Temp: 97.9 F (36.6 C) 98.2 F (36.8 C)  98.4 F (36.9 C)  TempSrc: Oral Oral  Oral  SpO2: 92% 90%  94%  Weight:   67.7 kg   Height:        Last BM Date : 06/23/24  Intake/Output   Yesterday:  09/26 0701 - 09/27 0700 In: 120 [P.O.:120] Out: 1525 [Urine:1525] This shift:  No intake/output data  recorded.  Bowel function:  Flatus: YES  BM:  YES  Drain: (No drain)   Physical Exam:  General: Pt awake/alert in no acute distress.  Sitting up reading on his smart phone Eyes: PERRL, normal EOM.  Sclera clear.  No icterus Neuro: CN II-XII intact w/o focal sensory/motor deficits. Lymph: No head/neck/groin lymphadenopathy Psych:  No delerium/psychosis/paranoia.  Oriented x 4 HENT: Normocephalic, Mucus membranes moist.  No thrush Neck: Supple, No tracheal deviation.  No obvious thyromegaly Chest: No pain to chest wall compression.  Good respiratory excursion.  No audible wheezing CV:  Pulses intact.  Regular rhythm.  No major extremity edema MS: Normal AROM mjr joints.  No obvious deformity  Abdomen: Soft.  Mildy distended.  Nontender.  No evidence of peritonitis.  No incarcerated hernias.  Ext:  No deformity.  No mjr edema.  No cyanosis Skin: No petechiae / purpurea.  No major sores.  Warm and dry    Results:   Cultures: Recent Results (from the past 720 hours)  MRSA Next Gen by PCR, Nasal     Status: None   Collection Time: 06/06/24 12:45 AM   Specimen: Nasal Mucosa; Nasal Swab  Result Value Ref Range Status   MRSA by PCR Next Gen NOT DETECTED NOT DETECTED Final    Comment: (NOTE) The GeneXpert MRSA Assay (FDA approved for NASAL specimens only), is one component of a comprehensive MRSA colonization surveillance program. It is not intended to diagnose MRSA infection nor to guide or monitor treatment for MRSA infections. Test performance is not FDA approved in patients less than 36 years old. Performed at [facility], [address], [place], [place] [address]   MRSA Next Gen by PCR, Nasal     Status: None   Collection Time: 06/11/24  7:43 AM   Specimen: Nasal Mucosa; Nasal Swab  Result Value Ref Range Status   MRSA by PCR Next Gen NOT DETECTED NOT DETECTED Final    Comment: (NOTE) The GeneXpert MRSA Assay (FDA approved for NASAL specimens only), is one  component of a comprehensive MRSA colonization surveillance program. It is not intended to diagnose MRSA infection nor to guide or monitor treatment for MRSA infections. Test performance is not FDA approved in patients less than 30 years old. Performed at [facility], [address] [place], [place] [address]     Labs: Results for orders placed or performed during the hospital encounter of 06/05/24 (from the past 48 hours)  Glucose, capillary     Status: Abnormal   Collection Time: 06/22/24 10:28 AM  Result Value Ref Range   Glucose-Capillary 128 (H) 70 - 99 mg/dL    Comment: Glucose reference range applies only to samples taken after fasting for at least 8 hours.  Glucose, capillary     Status: None   Collection Time: 06/22/24 12:32 PM  Result Value Ref Range   Glucose-Capillary 82 70 - 99 mg/dL  Comment: Glucose reference range applies only to samples taken after fasting for at least 8 hours.  Glucose, capillary     Status: Abnormal   Collection Time: 06/22/24  4:20 PM  Result Value Ref Range   Glucose-Capillary 111 (H) 70 - 99 mg/dL    Comment: Glucose reference range applies only to samples taken after fasting for at least 8 hours.  Glucose, capillary     Status: Abnormal   Collection Time: 06/22/24  9:19 PM  Result Value Ref Range   Glucose-Capillary 125 (H) 70 - 99 mg/dL    Comment: Glucose reference range applies only to samples taken after fasting for at least 8 hours.  CBC with Differential/Platelet     Status: Abnormal   Collection Time: 06/23/24  4:22 AM  Result Value Ref Range   WBC 11.4 (H) 4.0 - 10.5 K/uL   RBC 2.82 (L) 4.22 - 5.81 MIL/uL   Hemoglobin 8.8 (L) 13.0 - 17.0 g/dL   HCT 73.1 (L) 60.9 - 47.9 %   MCV 95.0 80.0 - 100.0 fL   MCH 31.2 26.0 - 34.0 pg   MCHC 32.8 30.0 - 36.0 g/dL   RDW 86.5 88.4 - 84.4 %   Platelets 318 150 - 400 K/uL   nRBC 0.0 0.0 - 0.2 %   Neutrophils Relative % 72 %   Neutro Abs 8.3 (H) 1.7 - 7.7 K/uL   Lymphocytes  Relative 17 %   Lymphs Abs 1.9 0.7 - 4.0 K/uL   Monocytes Relative 8 %   Monocytes Absolute 0.9 0.1 - 1.0 K/uL   Eosinophils Relative 2 %   Eosinophils Absolute 0.2 0.0 - 0.5 K/uL   Basophils Relative 0 %   Basophils Absolute 0.1 0.0 - 0.1 K/uL   Immature Granulocytes 1 %   Abs Immature Granulocytes 0.09 (H) 0.00 - 0.07 K/uL    Comment: Performed at [facility], [address], [place], [place] [address]  Basic metabolic panel with GFR     Status: Abnormal   Collection Time: 06/23/24  4:22 AM  Result Value Ref Range   Sodium 132 (L) 135 - 145 mmol/L   Potassium 4.1 3.5 - 5.1 mmol/L   Chloride 101 98 - 111 mmol/L   CO2 26 22 - 32 mmol/L   Glucose, Bld 109 (H) 70 - 99 mg/dL    Comment: Glucose reference range applies only to samples taken after fasting for at least 8 hours.   BUN 20 8 - 23 mg/dL   Creatinine, Ser 9.25 0.61 - 1.24 mg/dL   Calcium  8.2 (L) 8.9 - 10.3 mg/dL   GFR, Estimated >39 >39 mL/min    Comment: (NOTE) Calculated using the CKD-EPI Creatinine Equation (2021)    Anion gap 5 5 - 15    Comment: Performed at [facility], [address], [place], [place] [address]  Magnesium      Status: None   Collection Time: 06/23/24  4:22 AM  Result Value Ref Range   Magnesium  1.8 1.7 - 2.4 mg/dL    Comment: Performed at [facility], [address], [place], [place] [address]  Phosphorus     Status: None   Collection Time: 06/23/24  4:22 AM  Result Value Ref Range   Phosphorus 3.2 2.5 - 4.6 mg/dL    Comment: Performed at [facility], [address] [place], [place] [address]  Procalcitonin     Status: None   Collection Time: 06/23/24  4:22  AM  Result Value Ref Range   Procalcitonin <0.10 ng/mL    Comment:        Interpretation: PCT (Procalcitonin) <= 0.5 ng/mL: Systemic infection (sepsis) is not likely. Local bacterial infection is possible. (NOTE)       Sepsis PCT Algorithm           Lower Respiratory Tract                                       Infection PCT Algorithm    ----------------------------     ----------------------------         PCT < 0.25 ng/mL                PCT < 0.10 ng/mL          Strongly encourage             Strongly discourage   discontinuation of antibiotics    initiation of antibiotics    ----------------------------     -----------------------------       PCT 0.25 - 0.50 ng/mL            PCT 0.10 - 0.25 ng/mL               OR       >80% decrease in PCT            Discourage initiation of                                            antibiotics      Encourage discontinuation           of antibiotics    ----------------------------     -----------------------------         PCT >= 0.50 ng/mL              PCT 0.26 - 0.50 ng/mL               AND        <80% decrease in PCT             Encourage initiation of                                             antibiotics       Encourage continuation           of antibiotics    ----------------------------     -----------------------------        PCT >= 0.50 ng/mL                  PCT > 0.50 ng/mL               AND         increase in PCT                  Strongly encourage                                      initiation of antibiotics    Strongly encourage escalation  of antibiotics                                     -----------------------------                                           PCT <= 0.25 ng/mL                                                 OR                                        > 80% decrease in PCT                                      Discontinue / Do not initiate                                             antibiotics  Performed at [facility], [address] [place], [place] [address]   C-reactive protein     Status: Abnormal   Collection Time: 06/23/24  4:22 AM  Result Value Ref Range   CRP 7.3 (H) <1.0 mg/dL    Comment: Performed at [facility], [address] [place], [place] [address]  Glucose, capillary      Status: Abnormal   Collection Time: 06/23/24  8:21 AM  Result Value Ref Range   Glucose-Capillary 100 (H) 70 - 99 mg/dL    Comment: Glucose reference range applies only to samples taken after fasting for at least 8 hours.  Hemoglobin A1c     Status: Abnormal   Collection Time: 06/23/24  8:38 AM  Result Value Ref Range   Hgb A1c MFr Bld 6.0 (H) 4.8 - 5.6 %    Comment: (NOTE) Diagnosis of Diabetes The following HbA1c ranges recommended by the American Diabetes Association (ADA) may be used as an aid in the diagnosis of diabetes mellitus.  Hemoglobin             Suggested A1C NGSP%              Diagnosis  <5.7                   Non Diabetic  5.7-6.4                Pre-Diabetic  >6.4                   Diabetic  <7.0                   Glycemic control for                       adults with diabetes.     Mean Plasma Glucose 125.5 mg/dL    Comment: Performed at [facility]  Lab, [address] [place], [place] [address]  Glucose, capillary     Status: Abnormal   Collection Time: 06/23/24 12:16 PM  Result Value Ref Range   Glucose-Capillary 103 (H) 70 - 99 mg/dL    Comment: Glucose reference range applies only to samples taken after fasting for at least 8 hours.  Glucose, capillary     Status: Abnormal   Collection Time: 06/23/24  5:09 PM  Result Value Ref Range   Glucose-Capillary 131 (H) 70 - 99 mg/dL    Comment: Glucose reference range applies only to samples taken after fasting for at least 8 hours.  Glucose, capillary     Status: Abnormal   Collection Time: 06/23/24  9:11 PM  Result Value Ref Range   Glucose-Capillary 102 (H) 70 - 99 mg/dL    Comment: Glucose reference range applies only to samples taken after fasting for at least 8 hours.  Basic metabolic panel with GFR     Status: Abnormal   Collection Time: 06/24/24  3:13 AM  Result Value Ref Range   Sodium 135 135 - 145 mmol/L   Potassium 3.9 3.5 - 5.1 mmol/L   Chloride 99 98 - 111 mmol/L   CO2 27 22 - 32  mmol/L   Glucose, Bld 101 (H) 70 - 99 mg/dL    Comment: Glucose reference range applies only to samples taken after fasting for at least 8 hours.   BUN 19 8 - 23 mg/dL   Creatinine, Ser 9.18 0.61 - 1.24 mg/dL   Calcium  8.5 (L) 8.9 - 10.3 mg/dL   GFR, Estimated >39 >39 mL/min    Comment: (NOTE) Calculated using the CKD-EPI Creatinine Equation (2021)    Anion gap 9 5 - 15    Comment: Performed at [facility], [address], [place], [place] [address]  CBC with Differential/Platelet     Status: Abnormal   Collection Time: 06/24/24  3:13 AM  Result Value Ref Range   WBC 11.1 (H) 4.0 - 10.5 K/uL   RBC 2.81 (L) 4.22 - 5.81 MIL/uL   Hemoglobin 8.7 (L) 13.0 - 17.0 g/dL   HCT 72.9 (L) 60.9 - 47.9 %   MCV 96.1 80.0 - 100.0 fL   MCH 31.0 26.0 - 34.0 pg   MCHC 32.2 30.0 - 36.0 g/dL   RDW 86.6 88.4 - 84.4 %   Platelets 331 150 - 400 K/uL   nRBC 0.0 0.0 - 0.2 %   Neutrophils Relative % 74 %   Neutro Abs 8.2 (H) 1.7 - 7.7 K/uL   Lymphocytes Relative 16 %   Lymphs Abs 1.8 0.7 - 4.0 K/uL   Monocytes Relative 7 %   Monocytes Absolute 0.8 0.1 - 1.0 K/uL   Eosinophils Relative 2 %   Eosinophils Absolute 0.2 0.0 - 0.5 K/uL   Basophils Relative 0 %   Basophils Absolute 0.0 0.0 - 0.1 K/uL   Immature Granulocytes 1 %   Abs Immature Granulocytes 0.10 (H) 0.00 - 0.07 K/uL    Comment: Performed at [facility], [address] [place], [place] [address]  Magnesium      Status: None   Collection Time: 06/24/24  3:13 AM  Result Value Ref Range   Magnesium  1.8 1.7 - 2.4 mg/dL    Comment: Performed at [facility], [address], [place], [place] [address]  Glucose, capillary     Status: Abnormal   Collection Time: 06/24/24  7:52 AM  Result Value Ref  Range   Glucose-Capillary 102 (H) 70 - 99 mg/dL    Comment: Glucose reference range applies only to samples taken after fasting for at least 8 hours.    Imaging / Studies: No results found.  Medications / Allergies: per  chart  Antibiotics: Anti-infectives (From admission, onward)    Start     Dose/Rate Route Frequency Ordered Stop   06/11/24 0915  piperacillin -tazobactam (ZOSYN ) IVPB 3.375 g        3.375 g 12.5 mL/hr over 240 Minutes Intravenous Every 8 hours 06/11/24 0820 06/16/24 0118   06/11/24 0800  cefTRIAXone (ROCEPHIN) 1 g in sodium chloride  0.9 % 100 mL IVPB  Status:  Discontinued        1 g 200 mL/hr over 30 Minutes Intravenous Every 24 hours 06/11/24 0642 06/11/24 0710   06/11/24 0800  cefTRIAXone (ROCEPHIN) 2 g in sodium chloride  0.9 % 100 mL IVPB  Status:  Discontinued        2 g 200 mL/hr over 30 Minutes Intravenous Every 24 hours 06/11/24 0710 06/11/24 0815         Note: Portions of this report may have been transcribed using voice recognition software. Every effort was made to ensure accuracy; however, inadvertent computerized transcription errors may be present.   Any transcriptional errors that result from this process are unintentional.    Gary KYM Schultze, MD, FACS, MASCRS Esophageal, Gastrointestinal & Colorectal Surgery Robotic and Minimally Invasive Surgery  [facility] [address] [place], [place] [address] [phone] Fax [phone] Main  CONTACT INFORMATION: Weekday (9AM-5PM): Call [facility] main office at [phone] Weeknight (5PM-9AM) or Weekend/Holiday: Check EPIC Web Links tab & use AMION (password  [email or web address]) for General Surgery [facility] coverage  Please, DO NOT use SecureChat  (it is not reliable communication to reach operating surgeons & will lead to a delay in care).   Epic staff messaging available for outptient concerns needing 1-2 business day response.      06/24/2024  8:21 AM

## 2024-06-24 NOTE — Discharge Summary (Signed)
 Gary Rice [facility] FMW:[id number] DOB: 04-Oct-1930 DOA: 06/05/2024  PCP: Kip Righter, MD  Admit date: 06/05/2024  Discharge date: 06/24/2024  Admitted From: Home   Disposition:  SNF   Recommendations for Outpatient Follow-up:   Follow up with PCP in 1-2 weeks  PCP Please obtain BMP/CBC, 2 view CXR in 1week,  (see Discharge instructions)   PCP Please follow up on the following pending results:    Home Health: None   Equipment/Devices: None  Consultations: CCS, Cards Discharge Condition: Stable    CODE STATUS: Full    Diet Recommendation: Low Carb    Chief Complaint  Patient presents with   Constipation     Brief history of present illness from the day of admission and additional interim summary    89 y.o. male with history of A-fib-presented with SBO and A-fib RVR. SBO initially managed with conservative measures with some improvement-A-fib RVR managed with Cardizem /heparin  infusion-unfortunately-on 9/13-developed worsening gastric distention-vomited and aspirated, along with A-fib RVR and hypotension. He was subsequently transferred to the ICU for close monitoring. Started on antibiotics for aspiration pneumonia, critical care, cardiology consulted and general surgery following. He was transferred to my service on 06/18/2024 on day 13 of his hospital stay.                                                                  Hospital Course   SBO initially being managed conservatively, general surgery on board finally had laparoscopic surgical intervention on 06/16/2024 - NG tube was removed on 06/20/2024, patient required TNA and now tolerating oral diet for several days, having bowel movements regularly and passing flatus.  Close to baseline and symptom-free.  General surgery was on board.  Will be discharged to SNF.    Chronic A-fib with RVR/hemoptysis/epistaxis: Initially rate controlled with Cardizem /beta-blocker but switch to amiodarone  once a day by cardiology along with Eliquis .  Now in good rate control no further bleeding.  Stable   Acute hypoxic respiratory failure secondary to aspiration pneumonia and third spacing of fluid: Treatment for pneumonia, adequately diuresed, much improved, symptom-free on 2 L nasal cannula oxygen.  Continue 2 L nasal cannula oxygen, encouraged the patient to sit in recliner and use I-S and flutter valve every hour while awake.  Monitor fluid status closely use Lasix  as needed if needed.   Acute blood loss anemia:  - Came in with normal hemoglobin, was noted to have hemoptysis as well as coffee-ground emesis which was positive for occult blood - Some perioperative blood loss after laparoscopic abdominal surgery, CBC now stable will be discharged on twice daily PPI PCP and SNF MD to watch CBC periodically.   AKI Hemodynamically mediated Resolved.   HLD Resume home statin   Aortic aneurysm: 4.6 cm.  Needs surveillance.  Defer to PCP.  Discharge diagnosis     Principal Problem:   Small bowel obstruction (HCC) Active Problems:   Paroxysmal atrial fibrillation with RVR (HCC)   Hyperlipidemia   AKI (acute kidney injury)   Acute hypoxic respiratory failure (HCC)   Aspiration pneumonia due to gastric secretions (HCC)   Hypotension   Atrial fibrillation with RVR (HCC)   Palliative care by specialist   Protein-calorie malnutrition, severe    Discharge instructions    Discharge Instructions     Discharge instructions   Complete by: As directed    Follow with Primary MD Kip Righter, MD in 7 days   Get CBC, CMP, Magnesium , 2 view Chest X ray -  checked next visit with your primary MD or SNF MD    Activity: As tolerated with Full fall precautions use walker/cane & assistance as needed  Disposition SNF    Diet: Heart Healthy Low Carb, check CBGs q.  ACHS   Special Instructions: If you have smoked or chewed Tobacco  in the last 2 yrs please stop smoking, stop any regular Alcohol  and or any Recreational drug use.  On your next visit with your primary care physician please Get Medicines reviewed and adjusted.  Please request your Prim.MD to go over all Hospital Tests and Procedure/Radiological results at the follow up, please get all Hospital records sent to your Prim MD by signing hospital release before you go home.  If you experience worsening of your admission symptoms, develop shortness of breath, life threatening emergency, suicidal or homicidal thoughts you must seek medical attention immediately by calling 911 or calling your MD immediately  if symptoms less severe.  You Must read complete instructions/literature along with all the possible adverse reactions/side effects for all the Medicines you take and that have been prescribed to you. Take any new Medicines after you have completely understood and accpet all the possible adverse reactions/side effects.   Do not drive when taking Pain medications.  Do not take more than prescribed Pain, Sleep and Anxiety Medications  Wear Seat belts while driving.   Increase activity slowly   Complete by: As directed    No wound care   Complete by: As directed        Discharge Medications   Allergies as of 06/24/2024   No Known Allergies      Medication List     STOP taking these medications    diltiazem  30 MG tablet Commonly known as: CARDIZEM        TAKE these medications    amiodarone  200 MG tablet Commonly known as: PACERONE  Take 1 tablet (200 mg total) by mouth daily. Start taking on: June 25, 2024   apixaban  5 MG Tabs tablet Commonly known as: ELIQUIS  Take 1 tablet (5 mg total) by mouth 2 (two) times daily.   atorvastatin  10 MG tablet Commonly known as: LIPITOR Take 10 mg by mouth at bedtime.   insulin  aspart 100 UNIT/ML FlexPen Commonly known as:  NOVOLOG  Before each meal 3 times a day, 140-199 - 2 units, 200-250 - 4 units, 251-299 - 6 units,  300-349 - 8 units,  350 or above 10 units.   ipratropium 0.03 % nasal spray Commonly known as: ATROVENT  Place 2 sprays into both nostrils every 12 (twelve) hours. What changed:  when to take this reasons to take this   multivitamin with minerals Tabs tablet Take 1 tablet by mouth daily with breakfast.   pantoprazole  40 MG tablet Commonly known as: PROTONIX  Take 1 tablet (40  mg total) by mouth 2 (two) times daily. What changed:  when to take this reasons to take this   polyethylene glycol 17 g packet Commonly known as: MIRALAX  / GLYCOLAX  Take 17 g by mouth as needed for mild constipation or moderate constipation.   PreserVision AREDS 2 Caps Take 1 capsule by mouth in the morning and at bedtime.   psyllium 58.6 % packet Commonly known as: METAMUCIL Take 1 packet by mouth daily as needed (for constipation).   Vitamin D3 50 MCG (2000 UT) Tabs Take 2,000 Units by mouth daily.   VITAMIN E PO Take 1 capsule by mouth daily.               Durable Medical Equipment  (From admission, onward)           Start     Ordered   06/07/24 1006  For home use only DME Walker  Once       Question:  Patient needs a walker to treat with the following condition  Answer:  Weakness   06/07/24 1006             Follow-up Information     [facility], [facility] Follow up.   Why: for home health services, they will call youto schedule Contact information: [address] [place] [address] [phone]         Stechschulte, Deward PARAS, MD. Go on 07/12/2024.   Specialty: Surgery Why: 2:20 PM, please arrive 30 min prior to appointment time to check in. Contact information: [address] [place] [address] [phone]         Kip Righter, MD. Schedule an appointment as soon as possible for a visit in 1 week(s).   Specialty: Family  Medicine Contact information: [address] [place] [address] [phone]                 Major procedures and Radiology Reports - PLEASE review detailed and final reports thoroughly  -      DG Chest Port 1 View Result Date: 06/22/2024 EXAM: 1 VIEW(S) XRAY OF THE CHEST 06/22/2024 06:37:08 AM COMPARISON: None, 06/19/2024. CLINICAL HISTORY: SOB (shortness of breath). SBO. ROVER. FINDINGS: LINES, TUBES AND DEVICES: Right arm PICC line with tip at the superior cavocaval junction. Enteric tube has been removed. LUNGS AND PLEURA: No focal pulmonary opacity. No pulmonary edema. Persistent small to moderate bilateral pleural effusions, right greater than left. No pneumothorax. HEART AND MEDIASTINUM: Aortic atherosclerosis. No acute abnormality of the cardiac and mediastinal silhouettes. BONES AND SOFT TISSUES: No acute osseous abnormality. IMPRESSION: 1. Persistent small to moderate bilateral pleural effusions, right greater than left. 2. Right arm PICC with tip at the superior cavoatrial junction. Electronically signed by: Waddell Calk MD 06/22/2024 06:41 AM EDT RP Workstation: [id number]   DG Chest Port 1 View Result Date: 06/19/2024 EXAM: 1 VIEW XRAY OF THE CHEST 06/19/2024 06:06:00 AM COMPARISON: 06/17/2024 CLINICAL HISTORY: SOB (shortness of breath) Sob,SBO; ROVER FINDINGS: LINES, TUBES AND DEVICES: Enteric tube beyond inferior aspect of film. Stable right PICC line. LUNGS AND PLEURA: Persistent small-to-moderate bilateral pleural effusions with veil-like opacification over both lower lung zones. Associated atelectasis noted within both lung bases. HEART AND MEDIASTINUM: Tortuous thoracic aorta. BONES AND SOFT TISSUES: No acute osseous abnormality. IMPRESSION: 1. Persistent small-to-moderate bilateral pleural effusions with associated lower lung zone opacification and basilar atelectasis. 2. Tortuous thoracic aorta. 3. Enteric tube tip not visualized (beyond inferior  aspect of film).  4. Stable right PICC line. Electronically signed by: Waddell Calk MD 06/19/2024 06:47 AM EDT RP Workstation: [id number]   DG Chest Port 1 View Result Date: 06/17/2024 CLINICAL DATA:  Cough. EXAM: PORTABLE CHEST 1 VIEW COMPARISON:  06/15/2024. FINDINGS: The heart size and mediastinal contours are stable. There is atherosclerotic calcification of the aorta. There is a small pleural effusion on the left and moderate pleural effusion on the right with associated atelectasis or infiltrate. No pneumothorax is seen. An enteric tube courses over the left upper quadrant and out of the field of view. A right-sided PICC line is stable in position. No acute osseous abnormality. IMPRESSION: 1. Moderate right pleural effusion and small left pleural effusion with associated atelectasis or infiltrate, slightly increased bilaterally. 2. Support apparatus as described above. Electronically Signed   By: Leita Waddell M.D.   On: 06/17/2024 17:31   DG CHEST PORT 1 VIEW Result Date: 06/15/2024 EXAM: 1 VIEW XRAY OF THE CHEST 06/15/2024 08:42:57 AM COMPARISON: 06/13/2024 CLINICAL HISTORY: CHF (congestive heart failure) (HCC) [address]2706. Reason for exam - CHF FINDINGS: LINES, TUBES AND DEVICES: Stable right PICC and nasogastric tube. LUNGS AND PLEURA: Increased right lung base opacification. Increased right pleural effusion. Stable trace left pleural effusion. HEART AND MEDIASTINUM: Ectatic thoracic aorta. BONES AND SOFT TISSUES: No acute osseous abnormality. IMPRESSION: 1. Right pleural effusion, increased, with worsening aeration in the left base. 2. Stable trace left pleural effusion. 3. Ectatic thoracic aorta. Electronically signed by: Waddell Calk MD 06/15/2024 01:38 PM EDT RP Workstation: [id number]   DG CHEST PORT 1 VIEW Result Date: 06/13/2024 CLINICAL DATA:  [id number].  Pneumonia. EXAM: PORTABLE CHEST 1 VIEW COMPARISON:  Portable chest 06/11/2024 at 4:12 p.m. FINDINGS: 7:47 a.m. NGT extends into the stomach to  the left with the tip in the proximal fundus. Right PICC again terminates at the superior cavoatrial junction. There are overlying telemetry leads. The cardiac size is normal. There is aortic tortuosity and atherosclerosis with stable mediastinum. There are small bilateral pleural effusions with overlying patchy airspace disease or atelectasis in the lung bases. The mid and upper lungs remain clear apart from linear perihilar atelectasis. Overall aeration seems unchanged.  No new osseous finding. IMPRESSION: 1. No significant change in the appearance of the chest. Small bilateral pleural effusions with overlying patchy airspace disease or atelectasis in the lung bases. 2. Aortic atherosclerosis. Electronically Signed   By: Francis Quam M.D.   On: 06/13/2024 07:59   DG Chest Port 1 View Result Date: 06/12/2024 CLINICAL DATA:  ARDS. EXAM: PORTABLE CHEST 1 VIEW COMPARISON:  06/10/2024 FINDINGS: The lungs are clear without focal pneumonia, edema, pneumothorax or pleural effusion. Interstitial markings are diffusely coarsened with chronic features. Cardiopericardial silhouette is at upper limits of normal for size. NG tube tip is in the gastric fundus with marked interval decrease in gaseous distention of the stomach. Telemetry leads overlie the chest. IMPRESSION: 1. Chronic interstitial coarsening without acute cardiopulmonary findings. 2. NG tube tip is in the gastric fundus with marked interval decrease in gaseous distention of the stomach. Electronically Signed   By: Camellia Candle M.D.   On: 06/12/2024 07:09   DG CHEST PORT 1 VIEW Result Date: 06/11/2024 CLINICAL DATA:  [id number] Status post PICC central line placement [id number] EXAM: PORTABLE CHEST 1 VIEW COMPARISON:  Chest x-ray 06/11/2024 8:45 a.m., chest x-ray 06/10/2024 FINDINGS: Interval placement of a right PICC with tip overlying the expected region of the distal superior vena cava. Redemonstration of enteric tube coursing below the hemidiaphragm with  tip  and side port overlying the expected region of the gastric lumen. The heart and mediastinal contours are unchanged. Interval increase in bilateral mid lower lung zone airspace opacities. Diffuse increased interstitial markings. No pneumothorax. No acute osseous abnormality. IMPRESSION: 1. Interval increase in bilateral mid lower lung zone airspace opacities. Findings suggestive of combination of pleural effusion and underlying atelectasis or pulmonary disease. 2. Lines and tubes as above. Electronically Signed   By: Morgane  Naveau M.D.   On: 06/11/2024 17:04   US  EKG SITE RITE Result Date: 06/11/2024 If Site Rite image not attached, placement could not be confirmed due to current cardiac rhythm.  DG Abd 1 View Result Date: 06/10/2024 CLINICAL DATA:  Small-bowel obstruction EXAM: DG ABDOMEN 1V COMPARISON:  06/09/2024 FINDINGS: Diffuse small bowel dilatation is noted. Contrast is seen within the colon. The stomach is distended with air. No free air is seen. Right hip replacement is again noted. IMPRESSION: Persistent small bowel dilatation consistent with obstruction. Electronically Signed   By: Oneil Devonshire M.D.   On: 06/10/2024 23:24   DG CHEST PORT 1 VIEW Result Date: 06/10/2024 CLINICAL DATA:  Short of breath, respiratory distress EXAM: PORTABLE CHEST 1 VIEW COMPARISON:  02/23/2022 FINDINGS: Single frontal view of the chest demonstrates an unremarkable cardiac silhouette. No airspace disease, effusion, or pneumothorax. Prominent gaseous distention of the stomach. IMPRESSION: 1. No acute intrathoracic process. 2. Gaseous distention of the stomach. Electronically Signed   By: Ozell Daring M.D.   On: 06/10/2024 22:44   ECHOCARDIOGRAM COMPLETE Result Date: 06/10/2024    ECHOCARDIOGRAM REPORT   Patient Name:   Gary Rice [facility] Date of Exam: 06/10/2024 Medical Rec #:  [id number]          Height:       69.0 in Accession #:    [id number]         Weight:       135.0 lb Date of Birth:  December 08, 1930           BSA:           1.748 m Patient Age:    89 years           BP:           98/65 mmHg Patient Gender: M                  HR:           98 bpm. Exam Location:  Inpatient Procedure: 2D Echo, Cardiac Doppler and Color Doppler (Both Spectral and Color            Flow Doppler were utilized during procedure). Indications:    Atrial Fibrillation I48.91  History:        Patient has prior history of Echocardiogram examinations, most                 recent 05/04/2021. Arrythmias:Atrial Fibrillation,                 Signs/Symptoms:Syncope; Risk Factors:Dyslipidemia.  Sonographer:    Thea Norlander RCS Referring Phys: DONALDA HERO GHIMIRE IMPRESSIONS  1. Technically difficult study with limited views. Left ventricular ejection fraction, by estimation, is 50 to 55%. The left ventricle has low normal function. Left ventricular endocardial border not optimally defined to evaluate regional wall motion. There is mild left ventricular hypertrophy. Left ventricular diastolic parameters are indeterminate.  2. Right ventricular systolic function was not well visualized. The right ventricular size is not well visualized.  3. The  mitral valve is degenerative. Trivial mitral valve regurgitation.  4. The aortic valve was not well visualized. Aortic valve regurgitation is trivial. No aortic stenosis is present. FINDINGS  Left Ventricle: Left ventricular ejection fraction, by estimation, is 50 to 55%. The left ventricle has low normal function. Left ventricular endocardial border not optimally defined to evaluate regional wall motion. The left ventricular internal cavity  size was small. There is mild left ventricular hypertrophy. Left ventricular diastolic parameters are indeterminate. Right Ventricle: The right ventricular size is not well visualized. No increase in right ventricular wall thickness. Right ventricular systolic function was not well visualized. Left Atrium: Left atrial size was normal in size. Right Atrium: Right atrial size was normal  in size. Pericardium: There is no evidence of pericardial effusion. Mitral Valve: The mitral valve is degenerative in appearance. Trivial mitral valve regurgitation. Tricuspid Valve: The tricuspid valve is normal in structure. Tricuspid valve regurgitation is trivial. Aortic Valve: The aortic valve was not well visualized. Aortic valve regurgitation is trivial. No aortic stenosis is present. Aortic valve peak gradient measures 5.8 mmHg. Pulmonic Valve: The pulmonic valve was not well visualized. Pulmonic valve regurgitation is not visualized. Aorta: The aortic root is normal in size and structure. IAS/Shunts: The interatrial septum was not well visualized.  LEFT VENTRICLE PLAX 2D LVIDd:         2.00 cm   Diastology LVIDs:         1.40 cm   LV e' medial:    7.62 cm/s LV PW:         1.10 cm   LV E/e' medial:  13.1 LV IVS:        1.00 cm   LV e' lateral:   11.40 cm/s LVOT diam:     2.00 cm   LV E/e' lateral: 8.8 LV SV:         43 LV SV Index:   25 LVOT Area:     3.14 cm  RIGHT VENTRICLE RV S prime:     12.00 cm/s TAPSE (M-mode): 1.8 cm LEFT ATRIUM           Index        RIGHT ATRIUM           Index LA diam:      3.00 cm 1.72 cm/m   RA Area:     13.90 cm LA Vol (A2C): 20.3 ml 11.61 ml/m  RA Volume:   28.90 ml  16.53 ml/m LA Vol (A4C): 22.7 ml 12.98 ml/m  AORTIC VALVE AV Area (Vmax): 2.01 cm AV Vmax:        120.00 cm/s AV Peak Grad:   5.8 mmHg LVOT Vmax:      76.63 cm/s LVOT Vmean:     58.967 cm/s LVOT VTI:       0.136 m  AORTA Ao Root diam: 3.60 cm MITRAL VALVE MV Area (PHT): 4.15 cm     SHUNTS MV Decel Time: 183 msec     Systemic VTI:  0.14 m MV E velocity: 100.00 cm/s  Systemic Diam: 2.00 cm MV A velocity: 59.20 cm/s MV E/A ratio:  1.69 Lonni Nanas MD Electronically signed by Lonni Nanas MD Signature Date/Time: 06/10/2024/1:06:18 PM    Final    DG Abd Portable 1V Result Date: 06/09/2024 CLINICAL DATA:  Nausea and vomiting. EXAM: PORTABLE ABDOMEN - 1 VIEW COMPARISON:  June 08, 2024.  FINDINGS: Continued small bowel dilatation is noted without colonic dilatation. Contrast and stool is noted in nondilated colon.  These findings are concerning for distal small bowel obstruction. IMPRESSION: Continued small bowel dilatation is noted concerning for distal small bowel obstruction. Electronically Signed   By: Lynwood Landy Raddle M.D.   On: 06/09/2024 17:39   DG Abd Portable 1V Result Date: 06/08/2024 CLINICAL DATA:  89 year old male with small-bowel obstruction on CT 06/05/2024 related to ventral abdominal adhesions. EXAM: PORTABLE ABDOMEN - 1 VIEW COMPARISON:  Abdominal radiographs yesterday and earlier. FINDINGS: Portable AP supine views at 0722 hours. Stable enteric tube terminating in the stomach. Oral contrast now in nondilated right and transverse colon. But ongoing gas distended mid and lower abdominal small bowel loops. Gas pattern only mildly improved from the scout view 06/05/2024. Stable lung bases. Stable visualized osseous structures. Spine degeneration and scoliosis. Right bipolar hip arthroplasty. IMPRESSION: 1. Partial small bowel obstruction. Oral contrast now in the colon but unresolved gas distended mid and lower abdominal small bowel loops, mildly improved since CT. 2. Stable enteric tube terminating in the proximal stomach. Electronically Signed   By: VEAR Hurst M.D.   On: 06/08/2024 10:31   DG Abd Portable 1V-Small Bowel Obstruction Protocol-initial, 8 hr delay Result Date: 06/07/2024 CLINICAL DATA:  Small bowel obstruction, 8 hour delay EXAM: PORTABLE ABDOMEN - 1 VIEW COMPARISON:  CT and plain films 06/05/2024 FINDINGS: NG tube is in the stomach. Oral contrast material seen within dilated small bowel loops. No contrast within the colon. Contrast seen in the bladder from prior CT. No organomegaly or free air. Prior right hip replacement. IMPRESSION: Oral contrast material within dilated small bowel loops. No passage into colon as of yet. Electronically Signed   By: Franky Crease M.D.    On: 06/07/2024 00:47   DG Abd Portable 1 View Result Date: 06/05/2024 CLINICAL DATA:  NG tube placement EXAM: PORTABLE ABDOMEN - 1 VIEW COMPARISON:  05/12/2021.  CT today. FINDINGS: NG tube tip is in the fundus of the stomach with the side port near the GE junction. Mildly dilated abdominal small bowel loops as seen on CT. IMPRESSION: NG tube tip in the fundus of the stomach. Electronically Signed   By: Franky Crease M.D.   On: 06/05/2024 17:20   CT ABDOMEN PELVIS W CONTRAST Result Date: 06/05/2024 EXAM: CT ABDOMEN AND PELVIS WITH CONTRAST 06/05/2024 02:41:01 PM TECHNIQUE: CT of the abdomen and pelvis was performed with the administration of intravenous contrast. Multiplanar reformatted images are provided for review. Automated exposure control, iterative reconstruction, and/or weight-based adjustment of the mA/kV was utilized to reduce the radiation dose to as low as reasonably achievable. COMPARISON: CT of 06/08/2021 CLINICAL HISTORY: Bowel obstruction suspected. FINDINGS: LOWER CHEST: Bibasilar scarring. LIVER: No acute abnormality. GALLBLADDER AND BILE DUCTS: Suspect subtle dependent gallstones. No acute cholecystitis or biliary duct dilatation. SPLEEN: No acute abnormality. PANCREAS: Normal pancreas for age. ADRENAL GLANDS: No acute abnormality. KIDNEYS, URETERS AND BLADDER: Dominant upper pole left renal 9.5 cm simple cyst. Other bilateral renal lesions are likely cysts and do not warrant imaging follow up. No hydronephrosis. No perinephric or periureteral stranding. Urinary bladder is unremarkable. GI AND BOWEL: Left inguinal hernia contains nonobstructive sigmoid colon. Decompressed terminal ileum. Proximal and mid small bowel loops are moderately dilated and fluid filled including at 4.5 cm on image 25/3. Focal transition identified within the upper pelvis including on the images 46-43 of series 3. No obstructive mass. Just distal to this, small bowel enters an area of ventral abdominal wall laxity.  Prior enterotomy felt to be distal to the site of obstruction. No  evidence of complicating ischemia. PERITONEUM AND RETROPERITONEUM: No free intraperitoneal air or abdominal pelvic ascites. VASCULATURE: Aortic atherosclerosis. Retroaortic left renal vein. LYMPH NODES: No lymphadenopathy. REPRODUCTIVE ORGANS: Mild prostatomegaly. BONES AND SOFT TISSUES: Convex right lumbar spine curvature. Lumbosacral spondylosis. Beam hardening artifact degrees evaluation of the pelvis secondary to right hip arthroplasty. No acute osseous abnormality. No focal soft tissue abnormality. IMPRESSION: 1. High-grade small bowel obstruction, likely secondary to adhesions. No complicating ischemia 2. Inguinal hernia containing nonobstructive sigmoid. 3. Probable cholelithiasis 4. beam hardening artifact limits evaluation of the pelvis secondary to right hip arthroplasty. Electronically signed by: Rockey Kilts MD 06/05/2024 03:17 PM EDT RP Workstation: [id number]    Micro Results    No results found for this or any previous visit (from the past 240 hours).  Today   Subjective    [name] today has no headache,no chest abdominal pain,no new weakness tingling or numbness, feels much better   Objective   Blood pressure 127/78, pulse 92, temperature 98.4 F (36.9 C), temperature source Oral, resp. rate 20, height (P) 5' 8 (1.727 m), weight 67.7 kg, SpO2 94%.   Intake/Output Summary (Last 24 hours) at 06/24/2024 0902 Last data filed at 06/24/2024 0432 Gross per 24 hour  Intake 120 ml  Output 1525 ml  Net -1405 ml    Exam  Awake Alert, No new F.N deficits,    [place].AT,PERRAL Supple Neck,   Symmetrical Chest wall movement, Good air movement bilaterally, CTAB RRR,No Gallops,   +ve B.Sounds, Abd Soft, Non tender,  No Cyanosis, Clubbing or edema    Data Review   Recent Labs  Lab 06/20/24 0101 06/21/24 0209 06/22/24 0349 06/23/24 0422 06/24/24 0313  WBC 11.6* 11.4* 12.0* 11.4* 11.1*  HGB 8.9* 8.8* 9.0* 8.8*  8.7*  HCT 27.1* 26.7* 27.2* 26.8* 27.0*  PLT 257 278 304 318 331  MCV 95.4 95.0 93.5 95.0 96.1  MCH 31.3 31.3 30.9 31.2 31.0  MCHC 32.8 33.0 33.1 32.8 32.2  RDW 13.3 13.4 13.3 13.4 13.3  LYMPHSABS 1.2 1.4 2.2 1.9 1.8  MONOABS 0.7 0.8 1.0 0.9 0.8  EOSABS 0.2 0.1 0.1 0.2 0.2  BASOSABS 0.1 0.1 0.1 0.1 0.0    Recent Labs  Lab 06/18/24 0353 06/19/24 1215 06/20/24 0101 06/21/24 0209 06/22/24 0349 06/23/24 0422 06/23/24 0838 06/24/24 0313  NA 134* 135 136 133* 134* 132*  --  135  K 4.7 4.1 4.2 4.4 3.7 4.1  --  3.9  CL 102 102 103 102 103 101  --  99  CO2 24 24 26 25 25 26   --  27  ANIONGAP 8 9 7 6 6 5   --  9  GLUCOSE 112* 121* 122* 108* 91 109*  --  101*  BUN 25* 22 20 19 21 20   --  19  CREATININE 0.60* 0.52* 0.60* 0.63 0.77 0.74  --  0.81  AST  --  42* 86* 101* 68*  --   --   --   ALT  --  50* 92* 134* 113*  --   --   --   ALKPHOS  --  50 55 63 60  --   --   --   BILITOT  --  0.6 0.6 0.3 0.4  --   --   --   ALBUMIN   --  2.1* 2.0* 1.9* 1.9*  --   --   --   CRP  --  19.8* 16.7* 12.0* 8.8* 7.3*  --   --   PROCALCITON <  0.10 <0.10 <0.10 <0.10 <0.10 <0.10  --   --   HGBA1C  --   --   --   --   --   --  6.0*  --   MG 2.1 1.8 1.8 1.9  --  1.8  --  1.8  PHOS 2.9 3.1 3.2 3.4  --  3.2  --   --   CALCIUM  8.4* 8.3* 8.4* 8.2* 8.2* 8.2*  --  8.5*    Total Time in preparing paper work, data evaluation and todays exam - 35 minutes  Signature  -    Lavada Stank M.D on 06/24/2024 at 9:02 AM   -  To page go to [email or web address]

## 2024-06-24 NOTE — Progress Notes (Signed)
 Call [facility] X2 to give report about patient coming to room 425, no response from Nursing Team. Phone number given for call back at their convenience.

## 2024-06-26 DIAGNOSIS — Z48815 Encounter for surgical aftercare following surgery on the digestive system: Secondary | ICD-10-CM | POA: Diagnosis not present

## 2024-06-26 DIAGNOSIS — I480 Paroxysmal atrial fibrillation: Secondary | ICD-10-CM | POA: Diagnosis not present

## 2024-06-26 DIAGNOSIS — R2689 Other abnormalities of gait and mobility: Secondary | ICD-10-CM | POA: Diagnosis not present

## 2024-06-26 DIAGNOSIS — E43 Unspecified severe protein-calorie malnutrition: Secondary | ICD-10-CM | POA: Diagnosis not present

## 2024-06-26 DIAGNOSIS — J9601 Acute respiratory failure with hypoxia: Secondary | ICD-10-CM | POA: Diagnosis not present

## 2024-06-26 DIAGNOSIS — E559 Vitamin D deficiency, unspecified: Secondary | ICD-10-CM | POA: Diagnosis not present

## 2024-06-26 DIAGNOSIS — K56609 Unspecified intestinal obstruction, unspecified as to partial versus complete obstruction: Secondary | ICD-10-CM | POA: Diagnosis not present

## 2024-06-26 DIAGNOSIS — K5909 Other constipation: Secondary | ICD-10-CM | POA: Diagnosis not present

## 2024-06-26 DIAGNOSIS — R5381 Other malaise: Secondary | ICD-10-CM | POA: Diagnosis not present

## 2024-06-26 DIAGNOSIS — K219 Gastro-esophageal reflux disease without esophagitis: Secondary | ICD-10-CM | POA: Diagnosis not present

## 2024-06-26 DIAGNOSIS — E119 Type 2 diabetes mellitus without complications: Secondary | ICD-10-CM | POA: Diagnosis not present

## 2024-06-26 DIAGNOSIS — I4891 Unspecified atrial fibrillation: Secondary | ICD-10-CM | POA: Diagnosis not present

## 2024-06-26 DIAGNOSIS — M6281 Muscle weakness (generalized): Secondary | ICD-10-CM | POA: Diagnosis not present

## 2024-06-27 DIAGNOSIS — K56609 Unspecified intestinal obstruction, unspecified as to partial versus complete obstruction: Secondary | ICD-10-CM | POA: Diagnosis not present

## 2024-06-27 DIAGNOSIS — K219 Gastro-esophageal reflux disease without esophagitis: Secondary | ICD-10-CM | POA: Diagnosis not present

## 2024-06-27 DIAGNOSIS — K5909 Other constipation: Secondary | ICD-10-CM | POA: Diagnosis not present

## 2024-06-27 DIAGNOSIS — I4891 Unspecified atrial fibrillation: Secondary | ICD-10-CM | POA: Diagnosis not present

## 2024-06-27 DIAGNOSIS — R5381 Other malaise: Secondary | ICD-10-CM | POA: Diagnosis not present

## 2024-06-27 DIAGNOSIS — E559 Vitamin D deficiency, unspecified: Secondary | ICD-10-CM | POA: Diagnosis not present

## 2024-06-27 DIAGNOSIS — E119 Type 2 diabetes mellitus without complications: Secondary | ICD-10-CM | POA: Diagnosis not present

## 2024-06-28 DIAGNOSIS — R5381 Other malaise: Secondary | ICD-10-CM | POA: Diagnosis not present

## 2024-06-28 DIAGNOSIS — E119 Type 2 diabetes mellitus without complications: Secondary | ICD-10-CM | POA: Diagnosis not present

## 2024-06-28 DIAGNOSIS — E559 Vitamin D deficiency, unspecified: Secondary | ICD-10-CM | POA: Diagnosis not present

## 2024-06-28 DIAGNOSIS — I4891 Unspecified atrial fibrillation: Secondary | ICD-10-CM | POA: Diagnosis not present

## 2024-06-28 DIAGNOSIS — K5909 Other constipation: Secondary | ICD-10-CM | POA: Diagnosis not present

## 2024-06-28 DIAGNOSIS — K56609 Unspecified intestinal obstruction, unspecified as to partial versus complete obstruction: Secondary | ICD-10-CM | POA: Diagnosis not present

## 2024-06-28 DIAGNOSIS — Z23 Encounter for immunization: Secondary | ICD-10-CM | POA: Diagnosis not present

## 2024-06-28 DIAGNOSIS — K219 Gastro-esophageal reflux disease without esophagitis: Secondary | ICD-10-CM | POA: Diagnosis not present

## 2024-06-29 DIAGNOSIS — I4891 Unspecified atrial fibrillation: Secondary | ICD-10-CM | POA: Diagnosis not present

## 2024-06-29 DIAGNOSIS — R5381 Other malaise: Secondary | ICD-10-CM | POA: Diagnosis not present

## 2024-06-29 DIAGNOSIS — E119 Type 2 diabetes mellitus without complications: Secondary | ICD-10-CM | POA: Diagnosis not present

## 2024-06-29 DIAGNOSIS — E559 Vitamin D deficiency, unspecified: Secondary | ICD-10-CM | POA: Diagnosis not present

## 2024-06-29 DIAGNOSIS — R2689 Other abnormalities of gait and mobility: Secondary | ICD-10-CM | POA: Diagnosis not present

## 2024-06-29 DIAGNOSIS — K5909 Other constipation: Secondary | ICD-10-CM | POA: Diagnosis not present

## 2024-06-29 DIAGNOSIS — Z48815 Encounter for surgical aftercare following surgery on the digestive system: Secondary | ICD-10-CM | POA: Diagnosis not present

## 2024-06-29 DIAGNOSIS — K219 Gastro-esophageal reflux disease without esophagitis: Secondary | ICD-10-CM | POA: Diagnosis not present

## 2024-06-29 DIAGNOSIS — I480 Paroxysmal atrial fibrillation: Secondary | ICD-10-CM | POA: Diagnosis not present

## 2024-06-29 DIAGNOSIS — I712 Thoracic aortic aneurysm, without rupture, unspecified: Secondary | ICD-10-CM | POA: Diagnosis not present

## 2024-06-29 DIAGNOSIS — I10 Essential (primary) hypertension: Secondary | ICD-10-CM | POA: Diagnosis not present

## 2024-06-29 DIAGNOSIS — K56609 Unspecified intestinal obstruction, unspecified as to partial versus complete obstruction: Secondary | ICD-10-CM | POA: Diagnosis not present

## 2024-06-29 DIAGNOSIS — M6281 Muscle weakness (generalized): Secondary | ICD-10-CM | POA: Diagnosis not present

## 2024-06-29 DIAGNOSIS — E43 Unspecified severe protein-calorie malnutrition: Secondary | ICD-10-CM | POA: Diagnosis not present

## 2024-06-29 DIAGNOSIS — J9601 Acute respiratory failure with hypoxia: Secondary | ICD-10-CM | POA: Diagnosis not present

## 2024-07-03 DIAGNOSIS — I480 Paroxysmal atrial fibrillation: Secondary | ICD-10-CM | POA: Diagnosis not present

## 2024-07-03 DIAGNOSIS — K219 Gastro-esophageal reflux disease without esophagitis: Secondary | ICD-10-CM | POA: Diagnosis not present

## 2024-07-03 DIAGNOSIS — M6281 Muscle weakness (generalized): Secondary | ICD-10-CM | POA: Diagnosis not present

## 2024-07-03 DIAGNOSIS — E559 Vitamin D deficiency, unspecified: Secondary | ICD-10-CM | POA: Diagnosis not present

## 2024-07-03 DIAGNOSIS — R5381 Other malaise: Secondary | ICD-10-CM | POA: Diagnosis not present

## 2024-07-03 DIAGNOSIS — K56609 Unspecified intestinal obstruction, unspecified as to partial versus complete obstruction: Secondary | ICD-10-CM | POA: Diagnosis not present

## 2024-07-03 DIAGNOSIS — E43 Unspecified severe protein-calorie malnutrition: Secondary | ICD-10-CM | POA: Diagnosis not present

## 2024-07-03 DIAGNOSIS — J9601 Acute respiratory failure with hypoxia: Secondary | ICD-10-CM | POA: Diagnosis not present

## 2024-07-03 DIAGNOSIS — R2689 Other abnormalities of gait and mobility: Secondary | ICD-10-CM | POA: Diagnosis not present

## 2024-07-03 DIAGNOSIS — K5909 Other constipation: Secondary | ICD-10-CM | POA: Diagnosis not present

## 2024-07-03 DIAGNOSIS — Z48815 Encounter for surgical aftercare following surgery on the digestive system: Secondary | ICD-10-CM | POA: Diagnosis not present

## 2024-07-03 DIAGNOSIS — I4891 Unspecified atrial fibrillation: Secondary | ICD-10-CM | POA: Diagnosis not present

## 2024-07-03 DIAGNOSIS — E119 Type 2 diabetes mellitus without complications: Secondary | ICD-10-CM | POA: Diagnosis not present

## 2024-07-05 DIAGNOSIS — I4891 Unspecified atrial fibrillation: Secondary | ICD-10-CM | POA: Diagnosis not present

## 2024-07-05 DIAGNOSIS — E559 Vitamin D deficiency, unspecified: Secondary | ICD-10-CM | POA: Diagnosis not present

## 2024-07-05 DIAGNOSIS — E119 Type 2 diabetes mellitus without complications: Secondary | ICD-10-CM | POA: Diagnosis not present

## 2024-07-05 DIAGNOSIS — K56609 Unspecified intestinal obstruction, unspecified as to partial versus complete obstruction: Secondary | ICD-10-CM | POA: Diagnosis not present

## 2024-07-05 DIAGNOSIS — R5381 Other malaise: Secondary | ICD-10-CM | POA: Diagnosis not present

## 2024-07-05 DIAGNOSIS — K5909 Other constipation: Secondary | ICD-10-CM | POA: Diagnosis not present

## 2024-07-05 DIAGNOSIS — K219 Gastro-esophageal reflux disease without esophagitis: Secondary | ICD-10-CM | POA: Diagnosis not present

## 2024-07-06 DIAGNOSIS — M6281 Muscle weakness (generalized): Secondary | ICD-10-CM | POA: Diagnosis not present

## 2024-07-06 DIAGNOSIS — E559 Vitamin D deficiency, unspecified: Secondary | ICD-10-CM | POA: Diagnosis not present

## 2024-07-06 DIAGNOSIS — E119 Type 2 diabetes mellitus without complications: Secondary | ICD-10-CM | POA: Diagnosis not present

## 2024-07-06 DIAGNOSIS — K219 Gastro-esophageal reflux disease without esophagitis: Secondary | ICD-10-CM | POA: Diagnosis not present

## 2024-07-06 DIAGNOSIS — K5909 Other constipation: Secondary | ICD-10-CM | POA: Diagnosis not present

## 2024-07-06 DIAGNOSIS — Z48815 Encounter for surgical aftercare following surgery on the digestive system: Secondary | ICD-10-CM | POA: Diagnosis not present

## 2024-07-06 DIAGNOSIS — R5381 Other malaise: Secondary | ICD-10-CM | POA: Diagnosis not present

## 2024-07-06 DIAGNOSIS — R2689 Other abnormalities of gait and mobility: Secondary | ICD-10-CM | POA: Diagnosis not present

## 2024-07-06 DIAGNOSIS — I4891 Unspecified atrial fibrillation: Secondary | ICD-10-CM | POA: Diagnosis not present

## 2024-07-06 DIAGNOSIS — J9601 Acute respiratory failure with hypoxia: Secondary | ICD-10-CM | POA: Diagnosis not present

## 2024-07-06 DIAGNOSIS — K56609 Unspecified intestinal obstruction, unspecified as to partial versus complete obstruction: Secondary | ICD-10-CM | POA: Diagnosis not present

## 2024-07-06 DIAGNOSIS — E43 Unspecified severe protein-calorie malnutrition: Secondary | ICD-10-CM | POA: Diagnosis not present

## 2024-07-06 DIAGNOSIS — I480 Paroxysmal atrial fibrillation: Secondary | ICD-10-CM | POA: Diagnosis not present

## 2024-07-10 DIAGNOSIS — E43 Unspecified severe protein-calorie malnutrition: Secondary | ICD-10-CM | POA: Diagnosis not present

## 2024-07-10 DIAGNOSIS — K56609 Unspecified intestinal obstruction, unspecified as to partial versus complete obstruction: Secondary | ICD-10-CM | POA: Diagnosis not present

## 2024-07-10 DIAGNOSIS — R5381 Other malaise: Secondary | ICD-10-CM | POA: Diagnosis not present

## 2024-07-10 DIAGNOSIS — R2689 Other abnormalities of gait and mobility: Secondary | ICD-10-CM | POA: Diagnosis not present

## 2024-07-10 DIAGNOSIS — M6281 Muscle weakness (generalized): Secondary | ICD-10-CM | POA: Diagnosis not present

## 2024-07-10 DIAGNOSIS — J9601 Acute respiratory failure with hypoxia: Secondary | ICD-10-CM | POA: Diagnosis not present

## 2024-07-10 DIAGNOSIS — K219 Gastro-esophageal reflux disease without esophagitis: Secondary | ICD-10-CM | POA: Diagnosis not present

## 2024-07-10 DIAGNOSIS — E119 Type 2 diabetes mellitus without complications: Secondary | ICD-10-CM | POA: Diagnosis not present

## 2024-07-10 DIAGNOSIS — I4891 Unspecified atrial fibrillation: Secondary | ICD-10-CM | POA: Diagnosis not present

## 2024-07-10 DIAGNOSIS — E559 Vitamin D deficiency, unspecified: Secondary | ICD-10-CM | POA: Diagnosis not present

## 2024-07-10 DIAGNOSIS — Z48815 Encounter for surgical aftercare following surgery on the digestive system: Secondary | ICD-10-CM | POA: Diagnosis not present

## 2024-07-10 DIAGNOSIS — I480 Paroxysmal atrial fibrillation: Secondary | ICD-10-CM | POA: Diagnosis not present

## 2024-07-10 DIAGNOSIS — K5909 Other constipation: Secondary | ICD-10-CM | POA: Diagnosis not present

## 2024-07-12 DIAGNOSIS — K219 Gastro-esophageal reflux disease without esophagitis: Secondary | ICD-10-CM | POA: Diagnosis not present

## 2024-07-12 DIAGNOSIS — I4891 Unspecified atrial fibrillation: Secondary | ICD-10-CM | POA: Diagnosis not present

## 2024-07-12 DIAGNOSIS — R5381 Other malaise: Secondary | ICD-10-CM | POA: Diagnosis not present

## 2024-07-12 DIAGNOSIS — K5909 Other constipation: Secondary | ICD-10-CM | POA: Diagnosis not present

## 2024-07-12 DIAGNOSIS — K56609 Unspecified intestinal obstruction, unspecified as to partial versus complete obstruction: Secondary | ICD-10-CM | POA: Diagnosis not present

## 2024-07-12 DIAGNOSIS — E559 Vitamin D deficiency, unspecified: Secondary | ICD-10-CM | POA: Diagnosis not present

## 2024-07-12 DIAGNOSIS — E119 Type 2 diabetes mellitus without complications: Secondary | ICD-10-CM | POA: Diagnosis not present

## 2024-07-12 NOTE — Progress Notes (Signed)
   PROVIDER:  DEWARD PURCHASE STECHSCHULTE, MD  MRN: [id number] DOB: 11-05-1930 DATE OF ENCOUNTER: 07/12/2024 Interval History:      History of Present Illness Gary Rice is a 89 year old male who presents for follow-up after recent surgery.  He experiences ongoing issues with bowel movements, characterized by periods of constipation and the need for assistance to initiate bowel movements. Bowel movements occur all at once after a gap between them. His bowel habits have not been normal for a long time.  He has a history of hernias, including a large hernia that developed after healing from a previous surgery three years ago. He experiences discomfort and bulging associated with the hernia, requiring the use of a hernia truss belt for support. Without the belt, the hernia fills, but he can apply pressure to empty it. He uses a summer belt over the primary area for additional support.  He mentions nasal drainage issues that were exacerbated by oxygen use. There has been improvement after discontinuing oxygen and increasing the use of nasal spray.  He is concerned about his physical recovery, noting weakness and that he is almost ready to walk again. He previously used a cane for security when mobilized.  He reports no pain or issues with the surgical incisions, stating that he did not feel anything post-surgery and was unaware of the number of incisions until informed by a nurse.      Physical Examination:   Physical Exam   Abd - Incisions c/d/I healing  Physical Exam    Assessment and Plan:     Gary Rice is a 89 y.o. male who underwent laparoscopic lysis of adhesions on 06/16/24, after failing conservative management of small bowel obstruction.  Diagnoses and all orders for this visit:  Postoperative state      Assessment & Plan Abdominal and inguinal hernias Chronic abdominal and inguinal hernias with significant bulging and discomfort. The hernias are large enough to  allow free movement of intestines, reducing the risk of incarceration or strangulation. Surgical repair is not recommended due to age-related risks. - Continue use of hernia truss belt for support. - Advise use of external compression, such as a pair of socks or a tennis ball, to manage hernia bulging. - Recommend lying down to allow hernias to reduce if discomfort increases.  Postoperative abdominal adhesions Postoperative abdominal adhesions were surgically addressed with successful outcomes, resolving bowel obstruction. He reports no pain or complications post-surgery, and incisions have healed well. - Encourage continued recovery and physical activity as tolerated.      Return if symptoms worsen or fail to improve.  This note has been created using automated tools and reviewed for accuracy by PAUL JEFFREY STECHSCHULTE.   The plan was discussed in detail with the patient today, who expressed understanding.  The patient has my contact information, and understands to call me with any additional questions or concerns in the interval.  I would be happy to see the patient back sooner if the need arises.   PAUL JEFFREY STECHSCHULTE, MD

## 2024-07-13 DIAGNOSIS — I4891 Unspecified atrial fibrillation: Secondary | ICD-10-CM | POA: Diagnosis not present

## 2024-07-13 DIAGNOSIS — E559 Vitamin D deficiency, unspecified: Secondary | ICD-10-CM | POA: Diagnosis not present

## 2024-07-13 DIAGNOSIS — K219 Gastro-esophageal reflux disease without esophagitis: Secondary | ICD-10-CM | POA: Diagnosis not present

## 2024-07-13 DIAGNOSIS — K56609 Unspecified intestinal obstruction, unspecified as to partial versus complete obstruction: Secondary | ICD-10-CM | POA: Diagnosis not present

## 2024-07-13 DIAGNOSIS — K5909 Other constipation: Secondary | ICD-10-CM | POA: Diagnosis not present

## 2024-07-13 DIAGNOSIS — R5381 Other malaise: Secondary | ICD-10-CM | POA: Diagnosis not present

## 2024-07-13 DIAGNOSIS — E119 Type 2 diabetes mellitus without complications: Secondary | ICD-10-CM | POA: Diagnosis not present

## 2024-07-15 NOTE — Progress Notes (Deleted)
 Cardiology Office Note:    Date:  07/15/2024   ID:  Norleen Charlie Ka, DOB 03/03/1931, MRN [id number]  PCP:  Kip Righter, MD  Cardiologist:  None { Click to update primary MD,subspecialty MD or APP then REFRESH:1}    Referring MD: Kip Righter, MD   Chief Complaint: hospital follow-up of atrial fibrillation/ flutter   History of Present Illness:    Gary Rice is a 89 y.o. male with a history of paroxysmal atrial fibrillation/ flutter on Eliquis , paroxysmal SVT, ascending thoracic aortic aneurysm, hyperlipidemia, type 2 diabetes mellitus, and recent small bowel obstruction who is followed by Dr. Jeffrie and presents today for hospital follow-up of atrial fibrillation/ flutter.  Patient is primarily followed by Cardiology for paroxysmal atrial fibrillation and an ascending thoracic aortic aneurysm. He has also had some paroxysmal SVT in the past. Last chest CTA in 10/2023 showed a stable ascending thoracic aortic aneurysm measuring 4.5 to 4.6 cm in greatest diameter. He was last seen by Jackee Alberts, NP, in 11/2023 at which time he was doing well from a cardiac standpoint.   He was recent admitted from 06/05/2024 to 06/24/2024 for a high grade small bowel obstruction. He was also noted to be in atrial fibrillation with RVR on admission. His small bowel obstruction was initially treated conservatively and a NGT was placed while atrial fibrillation was managed with an IV Cardizem  drip and IV Heparin . However, patient developed nausea and vomiting once NGT was removed and diet was advanced and repeat x-ray showed re-development of his small bowel obstruction requiring replacement of NGT tube. Hospitalization was also complicated by aspiration pneumonia, hemoptysis and hematemesis (while on IV Heparin ), persistent hypotension (requiring IV fluids, Midodrine , and Albumin ), and AKI. Echo showed LVEF of 50-55% with mild LVH and no significant valvular disease although it was a difficult study with  limited views.  Given his hypotension, he was started on Amiodarone  and Digoxin  because he continued to go in and out of atrial fibrillation/ flutter. He ultimately underwent surgical intervention on 06/16/2024. Following this, he was transitioned to PO Amiodarone  and Eliquis  was able to be resumed. Plan was for 2 week monitor after full recovery from his illness to see whether long term Amiodarone  is necessary or not. He was discharged to a SNF.  Patient presents today for follow-up. ***  Paroxysmal Atrial Fibrillation/ Flutter Paroxysmal SVT Patient has a history of paroxysmal atrial fibrillation and has also had SVT in the past. He was recently admitted in 05/2024 for a small bowel obstruction and hospitalization was complicated by atrial fibrillation/ flutter. Echo showed normal LV function. Patient was treated with IV Amiodarone  and Digoxin  due to hypotension and then transitioned to PO Amiodarone  prior to discharge. Outpatient monitor showed *** - *** - Continue Amiodarone  200mg  daily.  - Previously prescribed PRN Diltiazem  but this was held on recent discharge given hypotension. *** - Continue Eliquis  5mg  twice daily. No recurrent hemoptysis/ hematemesis. *** Will recheck CBC today. ***  Ascending Thoracic Aortic Aneurysm Last chest CTA in 10/2023 showed a stable ascending thoracic aortic aneurysm measuring 4.5 to 4.6 cm in greatest diameter.  - Plan is to repeat CTA in 10/2024. ***  Hyperlipidemia Lipid panel in ***: - Continue Lipitor 20mg  daily.   Type 2 Diabetes Mellitus Hemoglobin A1c 6.0% in 05/2024.  - On Insulin .  - Management per PCP.  EKGs/Labs/Other Studies Reviewed:    The following studies were reviewed:  Echocardiogram 06/10/2024: Impressions:  1. Technically difficult study with limited views. Left  ventricular  ejection fraction, by estimation, is 50 to 55%. The left ventricle has low  normal function. Left ventricular endocardial border not optimally defined  to  evaluate regional wall motion.  There is mild left ventricular hypertrophy. Left ventricular diastolic  parameters are indeterminate.   2. Right ventricular systolic function was not well visualized. The right  ventricular size is not well visualized.   3. The mitral valve is degenerative. Trivial mitral valve regurgitation.   4. The aortic valve was not well visualized. Aortic valve regurgitation  is trivial. No aortic stenosis is present.  _______________  Monitor ***  EKG:  EKG ordered today. EKG personally reviewed and demonstrates ***.  Recent Labs: 06/11/2024: TSH 0.739 06/15/2024: B Natriuretic Peptide 949.3 06/22/2024: ALT 113 06/24/2024: BUN 19; Creatinine, Ser 0.81; Hemoglobin 8.7; Magnesium  1.8; Platelets 331; Potassium 3.9; Sodium 135  Recent Lipid Panel    Component Value Date/Time   TRIG 36 06/19/2024 1214    Physical Exam:    Vital Signs: There were no vitals taken for this visit.    Wt Readings from Last 3 Encounters:  06/24/24 149 lb 4 oz (67.7 kg)  01/13/24 145 lb (65.8 kg)  01/11/24 144 lb (65.3 kg)     General: 89 y.o. male in no acute distress. HEENT: Normocephalic and atraumatic. Sclera clear.  Neck: Supple. No carotid bruits. No JVD. Heart: *** RRR. Distinct S1 and S2. No murmurs, gallops, or rubs.  Lungs: No increased work of breathing. Clear to ausculation bilaterally. No wheezes, rhonchi, or rales.  Abdomen: Soft, non-distended, and non-tender to palpation.  Extremities: No lower extremity edema.  Radial and distal pedal pulses 2+ and equal bilaterally. Skin: Warm and dry. Neuro: No focal deficits. Psych: Normal affect. Responds appropriately.   Assessment:    No diagnosis found.  Plan:     Disposition: Follow up in ***   Signed, Aline FORBES Door, PA-C  07/15/2024 4:27 PM    [facility]

## 2024-07-17 DIAGNOSIS — K5909 Other constipation: Secondary | ICD-10-CM | POA: Diagnosis not present

## 2024-07-17 DIAGNOSIS — Z48815 Encounter for surgical aftercare following surgery on the digestive system: Secondary | ICD-10-CM | POA: Diagnosis not present

## 2024-07-17 DIAGNOSIS — R2689 Other abnormalities of gait and mobility: Secondary | ICD-10-CM | POA: Diagnosis not present

## 2024-07-17 DIAGNOSIS — K219 Gastro-esophageal reflux disease without esophagitis: Secondary | ICD-10-CM | POA: Diagnosis not present

## 2024-07-17 DIAGNOSIS — E119 Type 2 diabetes mellitus without complications: Secondary | ICD-10-CM | POA: Diagnosis not present

## 2024-07-17 DIAGNOSIS — E559 Vitamin D deficiency, unspecified: Secondary | ICD-10-CM | POA: Diagnosis not present

## 2024-07-17 DIAGNOSIS — R5381 Other malaise: Secondary | ICD-10-CM | POA: Diagnosis not present

## 2024-07-17 DIAGNOSIS — J9601 Acute respiratory failure with hypoxia: Secondary | ICD-10-CM | POA: Diagnosis not present

## 2024-07-17 DIAGNOSIS — I480 Paroxysmal atrial fibrillation: Secondary | ICD-10-CM | POA: Diagnosis not present

## 2024-07-17 DIAGNOSIS — E43 Unspecified severe protein-calorie malnutrition: Secondary | ICD-10-CM | POA: Diagnosis not present

## 2024-07-17 DIAGNOSIS — I4891 Unspecified atrial fibrillation: Secondary | ICD-10-CM | POA: Diagnosis not present

## 2024-07-17 DIAGNOSIS — M6281 Muscle weakness (generalized): Secondary | ICD-10-CM | POA: Diagnosis not present

## 2024-07-17 DIAGNOSIS — K56609 Unspecified intestinal obstruction, unspecified as to partial versus complete obstruction: Secondary | ICD-10-CM | POA: Diagnosis not present

## 2024-07-18 DIAGNOSIS — E559 Vitamin D deficiency, unspecified: Secondary | ICD-10-CM | POA: Diagnosis not present

## 2024-07-18 DIAGNOSIS — K56609 Unspecified intestinal obstruction, unspecified as to partial versus complete obstruction: Secondary | ICD-10-CM | POA: Diagnosis not present

## 2024-07-18 DIAGNOSIS — I4891 Unspecified atrial fibrillation: Secondary | ICD-10-CM | POA: Diagnosis not present

## 2024-07-18 DIAGNOSIS — K219 Gastro-esophageal reflux disease without esophagitis: Secondary | ICD-10-CM | POA: Diagnosis not present

## 2024-07-18 DIAGNOSIS — K5909 Other constipation: Secondary | ICD-10-CM | POA: Diagnosis not present

## 2024-07-18 DIAGNOSIS — R5381 Other malaise: Secondary | ICD-10-CM | POA: Diagnosis not present

## 2024-07-18 DIAGNOSIS — E119 Type 2 diabetes mellitus without complications: Secondary | ICD-10-CM | POA: Diagnosis not present

## 2024-07-20 ENCOUNTER — Ambulatory Visit: Admitting: Student

## 2024-08-01 ENCOUNTER — Ambulatory Visit: Admitting: Physician Assistant

## 2024-08-16 NOTE — Progress Notes (Unsigned)
 Cardiology Office Note:    Date:  08/18/2024   ID:  Gary Rice, DOB Jun 23, 1931, MRN [id number]  PCP:  Kip Righter, MD   [facility] Providers Cardiologist:  Oneil Parchment, MD { Click to update primary MD,subspecialty MD or APP then REFRESH:1}    Referring MD: Kip Righter, MD   Chief complaint: Follow up of A-fib     History of Present Illness:   Gary Rice is a 89 y.o. 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  SBO s/p right inguinal hernia repair with mesh placement 05/03/2021 complicated by bowel perforation.  He had new onset A-fib was started on Eliquis  and Cardizem .  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  Admitted in May 2023 for syncopal episode, EKG without abnormal findings, CT head/cervical spine negative for acute findings, unclear etiology.  Cardiology follow-up in 2024 revealed no further episodes of syncope.  06/05/2024 presented to the ED with complaints of constipation X 4-5 days.  Was found to be in A-fib with RVR and a rate of 157, was cardiac unaware at that time, IV fluids and IV diltiazem  administered.  CT abdomen pelvis showed high-grade bowel obstruction, general surgery consulted, with laparoscopic small intestine repair performed on 06/16/2024.  IV diltiazem  and metoprolol  switched to IV amiodarone  and digoxin .  Hospital course complicated by hypotension with difficult to control A-fib, as well as hemoptysis and coffee-ground emesis.  Converted in and out of A-fib while in the hospital.  Digoxin  stopped, amiodarone  transition to p.o., Eliquis  resumed.  Palliative care was consulted, patient was transferred to SNF at discharge.  Presents independently, patient is actively in A. flutter with 2:1 conduction at a rate of 136 bpm.  Patient is completely asymptomatic,  and unaware that he is in this rhythm. He denies chest pain, palpitations, dyspnea, orthopnea, n, v,  dark/tarry/bloody stools, hematuria, dizziness, syncope, edema, weight gain.  He reports he takes 2 medications daily, his Eliquis  twice daily and his atorvastatin  once daily.  When questioned about amiodarone , he was unaware he was supposed to be taking this.  ROS:   Please see the history of present illness.    All other systems reviewed and are negative.     Past Medical History:  Diagnosis Date   Astigmatism    Basal cell carcinoma    Inguinal hernia    bilateral   Macular degeneration, age related, nonexudative    bilateral   SVT (supraventricular tachycardia)     Past Surgical History:  Procedure Laterality Date   APPENDECTOMY     age 89   BOWEL RESECTION  05/09/2021   Procedure: SMALL BOWEL RESECTION;  Surgeon: Ebbie Cough, MD;  Location: [facility] OR;  Service: General;;   CATARACT EXTRACTION, BILATERAL     HERNIA REPAIR     RIH   INGUINAL HERNIA REPAIR Right 05/02/2021   Procedure: REPAIR  INCARCERATED INGUINAL HERNIA  WITH MESH;  Surgeon: Paola Dreama SAILOR, MD;  Location: [facility] OR;  Service: General;  Laterality: Right;   JOINT REPLACEMENT Right    RTHA   LAPAROSCOPIC LYSIS OF ADHESIONS N/A 06/16/2024   Procedure: LYSIS, ADHESIONS, LAPAROSCOPIC;  Surgeon: Lyndel Deward PARAS, MD;  Location: [facility] OR;  Service: General;  Laterality: N/A;   LAPAROSCOPY N/A 05/09/2021   Procedure: LAPAROSCOPY DIAGNOSTIC;  Surgeon: Ebbie Cough, MD;  Location: [facility] OR;  Service: General;  Laterality: N/A;   LAPAROSCOPY N/A 06/16/2024   Procedure: LAPAROSCOPY, DIAGNOSTIC;  Surgeon: Lyndel Deward PARAS, MD;  Location: [facility] OR;  Service: General;  Laterality: N/A;  POSSIBLE OPEN   LAPAROTOMY  05/09/2021   Procedure: EXPLORATORY LAPAROTOMY;  Surgeon: Ebbie Cough, MD;  Location: [facility] OR;  Service: General;;   SMALL BOWEL REPAIR  06/16/2024   Procedure: REPAIR, SMALL INTESTINE;  Surgeon:  Lyndel Deward PARAS, MD;  Location: [facility] OR;  Service: General;;   TONSILLECTOMY      Current Medications: Current Meds  Medication Sig   apixaban  (ELIQUIS ) 5 MG TABS tablet Take 1 tablet (5 mg total) by mouth 2 (two) times daily.   atorvastatin  (LIPITOR) 10 MG tablet Take 10 mg by mouth at bedtime.   Cholecalciferol  (VITAMIN D3) 50 MCG (2000 UT) TABS Take 2,000 Units by mouth daily.   insulin  aspart (NOVOLOG ) 100 UNIT/ML FlexPen Before each meal 3 times a day, 140-199 - 2 units, 200-250 - 4 units, 251-299 - 6 units,  300-349 - 8 units,  350 or above 10 units.   ipratropium (ATROVENT ) 0.03 % nasal spray Place 2 sprays into both nostrils every 12 (twelve) hours. (Patient taking differently: Place 2 sprays into both nostrils 2 (two) times daily as needed for rhinitis.)   Multiple Vitamin (MULTIVITAMIN WITH MINERALS) TABS tablet Take 1 tablet by mouth daily with breakfast.   Multiple Vitamins-Minerals (PRESERVISION AREDS 2) CAPS Take 1 capsule by mouth in the morning and at bedtime.   pantoprazole  (PROTONIX ) 40 MG tablet Take 1 tablet (40 mg total) by mouth 2 (two) times daily.   polyethylene glycol (MIRALAX  / GLYCOLAX ) 17 g packet Take 17 g by mouth as needed for mild constipation or moderate constipation.   psyllium (METAMUCIL) 58.6 % packet Take 1 packet by mouth daily as needed (for constipation).   VITAMIN E PO Take 1 capsule by mouth daily.    [DISCONTINUED] amiodarone  (PACERONE ) 200 MG tablet Take 1 tablet (200 mg total) by mouth daily.     Allergies:   Patient has no known allergies.   Social History   Socioeconomic History   Marital status: Married    Spouse name: Not on file   Number of children: Not on file   Years of education: Not on file   Highest education level: Not on file  Occupational History   Not on file  Tobacco Use   Smoking status: Former    Current packs/day: 0.00    Average packs/day: 1 pack/day for 10.0 years (10.0 ttl pk-yrs)    Types: Cigarettes    Start  date: 36    Quit date: 1968    Years since quitting: 57.9   Smokeless tobacco: Never  Vaping Use   Vaping status: Never Used  Substance and Sexual Activity   Alcohol use: No   Drug use: No   Sexual activity: Not on file  Other Topics Concern   Not on file  Social History Narrative   Not on file   Social Drivers of Health   Financial Resource Strain: Not on file  Food Insecurity: No Food Insecurity (06/06/2024)   Hunger Vital Sign    Worried About Running Out of Food in the Last Year: Never true    Ran Out of Food in the Last Year: Never true  Transportation Needs: No Transportation Needs (06/06/2024)   PRAPARE - Administrator, Civil Service (Medical): No    Lack of Transportation (Non-Medical): No  Physical  Activity: Not on file  Stress: Not on file  Social Connections: Moderately Integrated (06/06/2024)   Social Connection and Isolation Panel    Frequency of Communication with Friends and Family: More than three times a week    Frequency of Social Gatherings with Friends and Family: Three times a week    Attends Religious Services: More than 4 times per year    Active Member of Clubs or Organizations: No    Attends Banker Meetings: Patient declined    Marital Status: Married     Family History: The patient's family history includes Heart attack in his father and son; Heart disease in his father; Sleep apnea in his son.  EKGs/Labs/Other Studies Reviewed:    The following studies were reviewed today:   ON ARRIVAL: EKG Interpretation Date/Time:  Thursday August 17 2024 15:07:38 EST Ventricular Rate:  78 PR Interval:  156 QRS Duration:  76 QT Interval:  392 QTC Calculation: 446 R Axis:   68  Text Interpretation: Sinus rhythm with Premature supraventricular complexes ST elevation, consider early repolarization, pericarditis, or injury Confirmed by Alesana Magistro [phone]) on 08/18/2024 9:55:16 AM     FOLLOWING CONVERSION: EKG  Interpretation Date/Time:  Thursday August 17 2024 15:07:38 EST Ventricular Rate:  78 PR Interval:  156 QRS Duration:  76 QT Interval:  392 QTC Calculation: 446 R Axis:   68  Text Interpretation: Sinus rhythm with Premature supraventricular complexes ST elevation, consider early repolarization, pericarditis, or injury Confirmed by Elaine Moloney [phone]) on 08/18/2024 9:55:16 AM   Recent Labs: 06/11/2024: TSH 0.739 06/15/2024: B Natriuretic Peptide 949.3 06/22/2024: ALT 113 06/24/2024: Magnesium  1.8 08/17/2024: BUN 21; Creatinine, Ser 0.80; Hemoglobin 12.1; Platelets 276; Potassium 4.4; Sodium 137  Recent Lipid Panel    Component Value Date/Time   TRIG 36 06/19/2024 1214     Risk Assessment/Calculations:    CHA2DS2-VASc Score = 3  {Confirm score is correct.  If not, click here to update score.  REFRESH note.  :1} This indicates a 3.2% annual risk of stroke. The patient's score is based upon: CHF History: 0 HTN History: 0 Diabetes History: 0 Stroke History: 0 Vascular Disease History: 1 Age Score: 2 Gender Score: 0   {This patient has a significant risk of stroke if diagnosed with atrial fibrillation.  Please consider VKA or DOAC agent for anticoagulation if the bleeding risk is acceptable.   You can also use the SmartPhrase .[email or web address] for documentation.   :[id number]}      Physical Exam:    VS:  BP 114/72 (BP Location: Right Arm, Patient Position: Sitting, Cuff Size: Normal)   Pulse (!) 136   Wt 136 lb 12.8 oz (62.1 kg)   BMI (P) 20.80 kg/m        Wt Readings from Last 3 Encounters:  08/17/24 136 lb 12.8 oz (62.1 kg)  06/24/24 149 lb 4 oz (67.7 kg)  01/13/24 145 lb (65.8 kg)     GEN:  Thin, well developed in no acute distress HEENT: Normal NECK:  No carotid bruits CARDIAC:  S1-S2 normal, RRR, no murmurs, rubs, gallops RESPIRATORY:  Clear to auscultation without rales, wheezing or rhonchi  MUSCULOSKELETAL:  1+ bilateral pitting edema; No deformity   SKIN: Warm and dry NEUROLOGIC:  Alert and oriented x 3 PSYCHIATRIC:  Normal affect       Assessment & Plan Atrial flutter with rapid ventricular response (HCC) Encounter for monitoring amiodarone  therapy EKG on arrival: 136 BPM, Atrial flutter with 2 to 1 block Nonspecific  ST and T wave abnormality Patient was completely asymptomatic, cardiac unaware, in no acute distress.  Reported this was likely from the stress of him getting from his car into the office. He denied having missed any doses of his Eliquis , however had been unaware that he was supposed to be taking amiodarone , and had not been since leaving the hospital.  Discussed case with DOD, Dr. Floretta, who agreed the patient should proceed to the emergency department for cardioversion and be restarted on a loading dose of amiodarone . On reassessment of the patient, he had converted to NSR. EKG: 78 BPM, Sinus rhythm with Premature supraventricular complexes ST elevation, consider early repolarization, pericarditis, or injury  Suspect ST elevation is secondary to demand ischemia from recent tachycardia, low suspicion for MI given patient is completely asymptomatic with stable vital signs. Will restart patient's amiodarone  Take amiodarone  200 mg twice daily X 1 week, then once daily thereafter. Continue Eliquis  5 mg twice daily as this is the correct dose for his age (43) weight (62.1 kg), and creatinine (0.81) Will order BMP and CBC today.  LFTs and TSH will need to be drawn at follow-up visit Will order echo to reevaluate for cardiomyopathies following unknown ectopy burden over the last few months and bilateral +1 pitting edema on exam Plan was discussed with Dr. Floretta and Scot Ford, PA, who agree.  Aneurysm of ascending aorta without rupture CTA chest aorta 11/19/2023: Stable aneurysmal disease of the ascending thoracic aorta measuring approximately 4.5-4.6, recommended semi-annual imaging via CTA or MRA.  Consider repeat CTA at  follow-up visit  Follow-up in 3-4 weeks Proceed to the emergency department for new/worsening symptoms          Medication Adjustments/Labs and Tests Ordered: Current medicines are reviewed at length with the patient today.  Concerns regarding medicines are outlined above.  Orders Placed This Encounter  Procedures   CBC   Basic metabolic panel with GFR   EKG 87-Ozji   EKG 12-Lead   ECHOCARDIOGRAM LIMITED   No orders of the defined types were placed in this encounter.   Patient Instructions  Medication Instructions:  TAKE AMIODARONE  200 MG TWICE A DAY FOR WEEK THEN AFTERWARDS ONCE DAILY *If you need a refill on your cardiac medications before your next appointment, please call your pharmacy*  Lab Work: CBC AND BMP TODAY If you have labs (blood work) drawn today and your tests are completely normal, you will receive your results only by: MyChart Message (if you have MyChart) OR A paper copy in the mail If you have any lab test that is abnormal or we need to change your treatment, we will call you to review the results.  Testing/Procedures:[address]  Your physician has requested that you have an echocardiogram. Echocardiography is a painless test that uses sound waves to create images of your heart. It provides your doctor with information about the size and shape of your heart and how well your heart's chambers and valves are working. This procedure takes approximately one hour. There are no restrictions for this procedure. Please do NOT wear cologne, perfume, aftershave, or lotions (deodorant is allowed). Please arrive 15 minutes prior to your appointment time.  Please note: We ask at that you not bring children with you during ultrasound (echo/ vascular) testing. Due to room size and safety concerns, children are not allowed in the ultrasound rooms during exams. Our front office staff cannot provide observation of children in our lobby area while testing is being  conducted.  An adult accompanying a patient to their appointment will only be allowed in the ultrasound room at the discretion of the ultrasound technician under special circumstances. We apologize for any inconvenience.     Follow-Up: At [facility], you and your health needs are our priority.  As part of our continuing mission to provide you with exceptional heart care, our providers are all part of one team.  This team includes your primary Cardiologist (physician) and Advanced Practice Providers or APPs (Physician Assistants and Nurse Practitioners) who all work together to provide you with the care you need, when you need it.  Your next appointment:   3-4 week(s)  Provider:   Scot Ford, PA-C   Signed, Eliakim Tendler E Gabe Glace, NP  08/18/2024 10:12 AM    [facility]

## 2024-08-17 ENCOUNTER — Ambulatory Visit: Attending: Physician Assistant | Admitting: Emergency Medicine

## 2024-08-17 ENCOUNTER — Encounter: Payer: Self-pay | Admitting: Physician Assistant

## 2024-08-17 ENCOUNTER — Other Ambulatory Visit: Payer: Self-pay

## 2024-08-17 VITALS — BP 114/72 | HR 136 | Wt 136.8 lb

## 2024-08-17 DIAGNOSIS — Z5181 Encounter for therapeutic drug level monitoring: Secondary | ICD-10-CM | POA: Diagnosis not present

## 2024-08-17 DIAGNOSIS — I4892 Unspecified atrial flutter: Secondary | ICD-10-CM | POA: Diagnosis not present

## 2024-08-17 DIAGNOSIS — I7121 Aneurysm of the ascending aorta, without rupture: Secondary | ICD-10-CM | POA: Diagnosis not present

## 2024-08-17 DIAGNOSIS — I480 Paroxysmal atrial fibrillation: Secondary | ICD-10-CM | POA: Diagnosis not present

## 2024-08-17 DIAGNOSIS — Z79899 Other long term (current) drug therapy: Secondary | ICD-10-CM | POA: Insufficient documentation

## 2024-08-17 DIAGNOSIS — I5020 Unspecified systolic (congestive) heart failure: Secondary | ICD-10-CM

## 2024-08-17 DIAGNOSIS — I471 Supraventricular tachycardia, unspecified: Secondary | ICD-10-CM

## 2024-08-17 MED ORDER — AMIODARONE HCL 200 MG PO TABS: ORAL_TABLET | ORAL | 1 refills | Status: DC

## 2024-08-17 MED ORDER — AMIODARONE HCL 200 MG PO TABS: ORAL_TABLET | ORAL | 1 refills | Status: AC

## 2024-08-17 NOTE — Progress Notes (Signed)
 Patient waiting for medication at pharmacy. Placed order for Amiodarone  200 mg by mouth twice daily for one week, then take once daily thereafter, per Hao Meng, PA.  Patient seen by Scot Ford PA today, see AVS.

## 2024-08-17 NOTE — Assessment & Plan Note (Addendum)
 CTA chest aorta 11/19/2023: Stable aneurysmal disease of the ascending thoracic aorta measuring approximately 4.5-4.6, recommended semi-annual imaging via CTA or MRA.  Consider repeat CTA at follow-up visit

## 2024-08-17 NOTE — Patient Instructions (Addendum)
 Medication Instructions:  TAKE AMIODARONE  200 MG TWICE A DAY FOR WEEK THEN AFTERWARDS ONCE DAILY *If you need a refill on your cardiac medications before your next appointment, please call your pharmacy*  Lab Work: CBC AND BMP TODAY If you have labs (blood work) drawn today and your tests are completely normal, you will receive your results only by: MyChart Message (if you have MyChart) OR A paper copy in the mail If you have any lab test that is abnormal or we need to change your treatment, we will call you to review the results.  Testing/Procedures:[address]  Your physician has requested that you have an echocardiogram. Echocardiography is a painless test that uses sound waves to create images of your heart. It provides your doctor with information about the size and shape of your heart and how well your heart's chambers and valves are working. This procedure takes approximately one hour. There are no restrictions for this procedure. Please do NOT wear cologne, perfume, aftershave, or lotions (deodorant is allowed). Please arrive 15 minutes prior to your appointment time.  Please note: We ask at that you not bring children with you during ultrasound (echo/ vascular) testing. Due to room size and safety concerns, children are not allowed in the ultrasound rooms during exams. Our front office staff cannot provide observation of children in our lobby area while testing is being conducted. An adult accompanying a patient to their appointment will only be allowed in the ultrasound room at the discretion of the ultrasound technician under special circumstances. We apologize for any inconvenience.     Follow-Up: At [facility], you and your health needs are our priority.  As part of our continuing mission to provide you with exceptional heart care, our providers are all part of one team.  This team includes your primary Cardiologist (physician) and Advanced Practice Providers or APPs  (Physician Assistants and Nurse Practitioners) who all work together to provide you with the care you need, when you need it.  Your next appointment:   3-4 week(s)  Provider:   Scot Ford, PA-C

## 2024-08-18 ENCOUNTER — Ambulatory Visit: Payer: Self-pay | Admitting: Physician Assistant

## 2024-08-18 LAB — BASIC METABOLIC PANEL WITH GFR
BUN/Creatinine Ratio: 26 — ABNORMAL HIGH (ref 10–24)
BUN: 21 mg/dL (ref 10–36)
CO2: 24 mmol/L (ref 20–29)
Calcium: 9.8 mg/dL (ref 8.6–10.2)
Chloride: 98 mmol/L (ref 96–106)
Creatinine, Ser: 0.8 mg/dL (ref 0.76–1.27)
Glucose: 85 mg/dL (ref 70–99)
Potassium: 4.4 mmol/L (ref 3.5–5.2)
Sodium: 137 mmol/L (ref 134–144)
eGFR: 84 mL/min/1.73 (ref >59)

## 2024-08-18 LAB — CBC
Hematocrit: 36.9 % — ABNORMAL LOW (ref 37.5–51.0)
Hemoglobin: 12.1 g/dL — ABNORMAL LOW (ref 13.0–17.7)
MCH: 31.1 pg (ref 26.6–33.0)
MCHC: 32.8 g/dL (ref 31.5–35.7)
MCV: 95 fL (ref 79–97)
Platelets: 276 x10E3/uL (ref 150–450)
RBC: 3.89 x10E6/uL — ABNORMAL LOW (ref 4.14–5.80)
RDW: 13.9 % (ref 11.6–15.4)
WBC: 11.8 x10E3/uL — ABNORMAL HIGH (ref 3.4–10.8)

## 2024-08-18 NOTE — Progress Notes (Signed)
 Red blood cell count significantly improved, white blood cell borderline elevated, but essentially unchanged when compare to 1 month ago. Normal renal function and electrolyte. Overall, reassuring result.

## 2024-08-21 DIAGNOSIS — Z8719 Personal history of other diseases of the digestive system: Secondary | ICD-10-CM | POA: Diagnosis not present

## 2024-08-21 DIAGNOSIS — Z7901 Long term (current) use of anticoagulants: Secondary | ICD-10-CM | POA: Diagnosis not present

## 2024-08-21 DIAGNOSIS — Z23 Encounter for immunization: Secondary | ICD-10-CM | POA: Diagnosis not present

## 2024-08-21 DIAGNOSIS — I480 Paroxysmal atrial fibrillation: Secondary | ICD-10-CM | POA: Diagnosis not present

## 2024-09-27 ENCOUNTER — Ambulatory Visit (HOSPITAL_COMMUNITY)
Admission: RE | Admit: 2024-09-27 | Discharge: 2024-09-27 | Disposition: A | Discharge status: Home / Self Care | Source: Ambulatory Visit | Attending: Physician Assistant | Admitting: Physician Assistant

## 2024-09-27 DIAGNOSIS — I4892 Unspecified atrial flutter: Secondary | ICD-10-CM | POA: Diagnosis not present

## 2024-09-27 LAB — ECHOCARDIOGRAM LIMITED
Area-P 1/2: 3.12 cm2
S' Lateral: 2.47 cm

## 2024-10-16 ENCOUNTER — Encounter: Payer: Self-pay | Admitting: Physician Assistant

## 2024-10-16 ENCOUNTER — Ambulatory Visit: Attending: Physician Assistant | Admitting: Physician Assistant

## 2024-10-16 VITALS — BP 118/74 | HR 62 | Ht 68.0 in | Wt 131.8 lb

## 2024-10-16 DIAGNOSIS — I7121 Aneurysm of the ascending aorta, without rupture: Secondary | ICD-10-CM | POA: Diagnosis present

## 2024-10-16 DIAGNOSIS — I4891 Unspecified atrial fibrillation: Secondary | ICD-10-CM | POA: Diagnosis not present

## 2024-10-16 DIAGNOSIS — E782 Mixed hyperlipidemia: Secondary | ICD-10-CM | POA: Insufficient documentation

## 2024-10-16 NOTE — Patient Instructions (Addendum)
 Medication Instructions:   No Change in Medication  *If you need a refill on your cardiac medications before your next appointment, please call your pharmacy*  Lab Work:  Obtain CBC, LFT and TSH today  If you have labs (blood work) drawn today and your tests are completely normal, you will receive your results only by: MyChart Message (if you have MyChart) OR A paper copy in the mail If you have any lab test that is abnormal or we need to change your treatment, we will call you to review the results.  Testing/Procedures:  None  Follow-Up: At [facility], you and your health needs are our priority.  As part of our continuing mission to provide you with exceptional heart care, our providers are all part of one team.  This team includes your primary Cardiologist (physician) and Advanced Practice Providers or APPs (Physician Assistants and Nurse Practitioners) who all work together to provide you with the care you need, when you need it.  Your next appointment:   6 month(s)  Provider:   Oneil Parchment, MD

## 2024-10-16 NOTE — Progress Notes (Unsigned)
 " Cardiology Office Note   Date:  10/17/2024  ID:  Ahmet Schank [place], DOB Jun 23, 1931, MRN [id number] PCP: Kip Righter, MD  [facility] Providers Cardiologist:  Oneil Parchment, MD     History of Present Illness Gary Rice is a 89 y.o. male with past medical history of A-fib on Eliquis , SVT, hyperlipidemia, bowel resection 2022, inguinal hernia repair 2022 and ascending aortic aneurysm.  Echocardiogram in 2022 showed EF 60 to 65%.  CT of chest obtained in February 2025 showed stable aortic aneurysm measuring 4.5-4.6 cm.  Patient was admitted to the hospital in September 2025 with abdominal pain and constipation.  EKG at the time showed A-fib with RVR with heart rate in the 150s.  CT of abdomen and pelvis showed high-grade SBO.  Echocardiogram obtained during the hospitalization was a technically difficult study but limited views, EF 50 to 55%, mild LVH, trivial MR, trivial AI, RV was normal well-visualized.  Hospitalization was also complicated by AKI and hemoptysis.  Patient was placed on IV Cardizem  along with IV heparin .  Cardiology service was consulted for A-fib with RVR at the time.  Attempt was made for rate control.  Patient underwent laparoscopic lysis of adhesion and a diagnostic laparoscopy by general surgery on 06/16/2024.  Patient was placed on both IV digoxin  and IV amiodarone  for rate control.  IV digoxin  was later stopped and the patient's IV amiodarone  was transition to oral. While on IV amiodarone , patient has been using and out of atrial fibrillation with controlled rate.  The plan was for outpatient 2-week heart monitor to assess A-fib burden and determine if long-term amiodarone  is necessary. Eliquis  was resumed prior to discharge.  It appears heart heart monitor was never obtained.   When he was followed up on 08/17/2024, patient was in 2-1 atrial flutter with a heart rate of 136 bpm.  He was completely asymptomatic.  He was compliant with Eliquis , however has not been  taking amiodarone .  Case was initially discussed with Dr. Leafy who recommended proceed with EGD, however on reassessment, patient has self converted to sinus rhythm.  Oral amiodarone  was restarted at 200 mg twice a day for 1 week then daily dosing thereafter.  Outpatient echocardiogram was recommended.  Patient presents today for follow-up.  He denies any chest pain or shortness of breath.  EKG today shows he is holding sinus rhythm.  He does feel cold after starting on amiodarone .  He denies any major bleeding issues.  I recommended CBC, liver function test and a TSH.  Overall, he has been doing well and can follow-up in 6 months   ROS:   He denies chest pain, palpitations, dyspnea, pnd, orthopnea, n, v, dizziness, syncope, edema, weight gain, or early satiety. All other systems reviewed and are otherwise negative except as noted above.    Studies Reviewed EKG Interpretation Date/Time:  Monday October 16 2024 14:11:51 EST Ventricular Rate:  62 PR Interval:  180 QRS Duration:  84 QT Interval:  436 QTC Calculation: 442 R Axis:   50  Text Interpretation: Normal sinus rhythm Normal ECG When compared with ECG of 17-Aug-2024 15:07, Premature supraventricular complexes are no longer Present Confirmed by Janene Boer [phone]) on 10/16/2024 2:12:34 PM    Cardiac Studies & Procedures   ______________________________________________________________________________________________     ECHOCARDIOGRAM  ECHOCARDIOGRAM LIMITED 09/27/2024  Narrative ECHOCARDIOGRAM LIMITED REPORT    Patient Name:   ETHER WOLTERS Date of Exam: 09/27/2024 Medical Rec #:  [id number]  Height:       68.0 in Accession #:    [id number]         Weight:       136.8 lb Date of Birth:  [birth year]           BSA:          1.739 m Patient Age:    89 years           BP:           114/72 mmHg Patient Gender: M                  HR:           65 bpm. Exam Location:  [address]  Procedure: Limited Echo, Cardiac  Doppler and Color Doppler (Both Spectral and Color Flow Doppler were utilized during procedure).  Indications:    Atrial flutter with rapid ventricular response (HCC) [I48.92 (ICD-10-CM)]  History:        Patient has prior history of Echocardiogram examinations, most recent 06/10/2024. Risk Factors:Dyslipidemia.  Sonographer:    Rosaline Fujisawa MHA, RDMS, RVT, RDCS Referring Phys: [phone] Katriel Cutsforth   Sonographer Comments: Image acquisition challenging due to patient body habitus. IMPRESSIONS   1. Left ventricular ejection fraction, by estimation, is 50 to 55%. The left ventricle has low normal function. Left ventricular endocardial border not optimally defined to evaluate regional wall motion. 2. Right ventricular systolic function is normal. The right ventricular size is normal. Tricuspid regurgitation signal is inadequate for assessing PA pressure. 3. The mitral valve is degenerative. No evidence of mitral valve regurgitation. No evidence of mitral stenosis. 4. The aortic valve was not well visualized. Aortic valve regurgitation is not visualized. No aortic stenosis is present. 5. The inferior vena cava is dilated in size with <50% respiratory variability, suggesting right atrial pressure of 15 mmHg.  Comparison(s): No significant change from prior study.  FINDINGS Left Ventricle: Left ventricular ejection fraction, by estimation, is 50 to 55%. The left ventricle has low normal function. Left ventricular endocardial border not optimally defined to evaluate regional wall motion. The left ventricular internal cavity size was normal in size. Suboptimal image quality limits for assessment of left ventricular hypertrophy.  Right Ventricle: The right ventricular size is normal. No increase in right ventricular wall thickness. Right ventricular systolic function is normal. Tricuspid regurgitation signal is inadequate for assessing PA pressure.  Mitral Valve: The mitral valve is  degenerative in appearance. No evidence of mitral valve stenosis.  Tricuspid Valve: The tricuspid valve is normal in structure. Tricuspid valve regurgitation is not demonstrated. No evidence of tricuspid stenosis.  Aortic Valve: The aortic valve was not well visualized. Aortic valve regurgitation is not visualized. No aortic stenosis is present.  Venous: The inferior vena cava is dilated in size with less than 50% respiratory variability, suggesting right atrial pressure of 15 mmHg.  Additional Comments: Spectral Doppler performed. Color Doppler performed.  LEFT VENTRICLE PLAX 2D LVIDd:         3.04 cm LVIDs:         2.47 cm LV PW:         0.66 cm LV IVS:        0.68 cm   IVC IVC diam: 2.38 cm  LEFT ATRIUM           Index        RIGHT ATRIUM          Index LA diam:  1.98 cm 1.14 cm/m   RA Area:     9.57 cm LA Vol (A2C): 9.3 ml  5.34 ml/m   RA Volume:   20.00 ml 11.50 ml/m LA Vol (A4C): 43.3 ml 24.90 ml/m MITRAL VALVE MV Area (PHT): 3.12 cm MV Decel Time: 243 msec MV E velocity: 74.90 cm/s MV A velocity: 87.50 cm/s MV E/A ratio:  0.86  Stanly Leavens MD Electronically signed by Stanly Leavens MD Signature Date/Time: 09/27/2024/3:15:08 PM    Final          ______________________________________________________________________________________________      Risk Assessment/Calculations  CHA2DS2-VASc Score = 3   This indicates a 3.2% annual risk of stroke. The patient's score is based upon: CHF History: 0 HTN History: 0 Diabetes History: 0 Stroke History: 0 Vascular Disease History: 1 Age Score: 2 Gender Score: 0           Physical Exam VS:  BP 118/74 (BP Location: Left Arm, Patient Position: Sitting, Cuff Size: Normal)   Pulse 62   Ht 5' 8 (1.727 m)   Wt 131 lb 12.8 oz (59.8 kg)   SpO2 97%   BMI 20.04 kg/m        Wt Readings from Last 3 Encounters:  10/16/24 131 lb 12.8 oz (59.8 kg)  08/17/24 136 lb 12.8 oz (62.1 kg)   06/24/24 149 lb 4 oz (67.7 kg)    GEN: Well nourished, well developed in no acute distress NECK: No JVD; No carotid bruits CARDIAC: RRR, no murmurs, rubs, gallops RESPIRATORY:  Clear to auscultation without rales, wheezing or rhonchi  ABDOMEN: Soft, non-tender, non-distended EXTREMITIES:  No edema; No deformity   ASSESSMENT AND PLAN  Persistent atrial fibrillation: Maintaining sinus rhythm on amiodarone .  Continue Eliquis .  He does feel cold after starting on amiodarone .  Will obtain CBC and TSH along with liver function test to rule out amiodarone  toxicity.  Thoracic aortic aneurysm: Previous CT showed in February 2025 showed thoracic aortic aneurysm measuring at 4.6 cm, given the patient has advanced age, we will hold off repeat as he we will be unlikely candidate for surgery  Hyperlipidemia: On atorvastatin        Dispo: Follow-up in 6 months  Signed, Arel Tippen, PA  "

## 2024-10-17 LAB — CBC
Hematocrit: 37.1 % — ABNORMAL LOW (ref 37.5–51.0)
Hemoglobin: 12.2 g/dL — ABNORMAL LOW (ref 13.0–17.7)
MCH: 29.4 pg (ref 26.6–33.0)
MCHC: 32.9 g/dL (ref 31.5–35.7)
MCV: 89 fL (ref 79–97)
Platelets: 310 x10E3/uL (ref 150–450)
RBC: 4.15 x10E6/uL (ref 4.14–5.80)
RDW: 14.8 % (ref 11.6–15.4)
WBC: 9.4 x10E3/uL (ref 3.4–10.8)

## 2024-10-17 LAB — HEPATIC FUNCTION PANEL
ALT: 15 IU/L (ref 0–44)
AST: 18 IU/L (ref 0–40)
Albumin: 3.8 g/dL (ref 3.6–4.6)
Alkaline Phosphatase: 81 IU/L (ref 48–129)
Bilirubin Total: 0.3 mg/dL (ref 0.0–1.2)
Bilirubin, Direct: 0.14 mg/dL (ref 0.00–0.40)
Total Protein: 7.3 g/dL (ref 6.0–8.5)

## 2024-10-17 LAB — TSH: TSH: 7.46 u[IU]/mL — AB (ref 0.450–4.500)

## 2024-10-18 ENCOUNTER — Ambulatory Visit: Payer: Self-pay | Admitting: Physician Assistant

## 2024-10-18 NOTE — Progress Notes (Signed)
 Normal red blood cell count, stable hemoglobin.  Normal liver enzyme. TSH is mildly elevated which suggest low thyroid  level.  This could be related to amiodarone , however Gary Rice still need amiodarone , therefore recommend follow-up with primary care provider to help manage thyroid .
